# Patient Record
Sex: Male | Born: 1935 | Race: White | Hispanic: No | Smoking: Former smoker
Health system: Southern US, Community
[De-identification: ages and names within clinical notes are randomized; demographics above are authoritative.]

## PROBLEM LIST (undated history)

## (undated) DIAGNOSIS — K635 Polyp of colon: Secondary | ICD-10-CM

## (undated) DIAGNOSIS — M1990 Unspecified osteoarthritis, unspecified site: Secondary | ICD-10-CM

## (undated) DIAGNOSIS — E7529 Other sphingolipidosis: Secondary | ICD-10-CM

## (undated) DIAGNOSIS — G20C Parkinsonism, unspecified: Secondary | ICD-10-CM

## (undated) DIAGNOSIS — G459 Transient cerebral ischemic attack, unspecified: Secondary | ICD-10-CM

## (undated) DIAGNOSIS — G4730 Sleep apnea, unspecified: Secondary | ICD-10-CM

## (undated) DIAGNOSIS — I639 Cerebral infarction, unspecified: Secondary | ICD-10-CM

## (undated) DIAGNOSIS — G4700 Insomnia, unspecified: Secondary | ICD-10-CM

## (undated) DIAGNOSIS — J45909 Unspecified asthma, uncomplicated: Secondary | ICD-10-CM

## (undated) DIAGNOSIS — I25810 Atherosclerosis of coronary artery bypass graft(s) without angina pectoris: Secondary | ICD-10-CM

## (undated) DIAGNOSIS — F419 Anxiety disorder, unspecified: Secondary | ICD-10-CM

## (undated) DIAGNOSIS — Z5189 Encounter for other specified aftercare: Secondary | ICD-10-CM

## (undated) DIAGNOSIS — Z87442 Personal history of urinary calculi: Secondary | ICD-10-CM

## (undated) DIAGNOSIS — K5790 Diverticulosis of intestine, part unspecified, without perforation or abscess without bleeding: Secondary | ICD-10-CM

## (undated) DIAGNOSIS — F32A Depression, unspecified: Secondary | ICD-10-CM

## (undated) DIAGNOSIS — E785 Hyperlipidemia, unspecified: Secondary | ICD-10-CM

## (undated) DIAGNOSIS — E119 Type 2 diabetes mellitus without complications: Secondary | ICD-10-CM

## (undated) DIAGNOSIS — J4489 Other specified chronic obstructive pulmonary disease: Secondary | ICD-10-CM

## (undated) DIAGNOSIS — IMO0002 Reserved for concepts with insufficient information to code with codable children: Secondary | ICD-10-CM

## (undated) DIAGNOSIS — G20 Parkinson's disease: Secondary | ICD-10-CM

## (undated) DIAGNOSIS — R413 Other amnesia: Secondary | ICD-10-CM

## (undated) DIAGNOSIS — K219 Gastro-esophageal reflux disease without esophagitis: Secondary | ICD-10-CM

## (undated) DIAGNOSIS — K269 Duodenal ulcer, unspecified as acute or chronic, without hemorrhage or perforation: Secondary | ICD-10-CM

## (undated) DIAGNOSIS — J449 Chronic obstructive pulmonary disease, unspecified: Secondary | ICD-10-CM

## (undated) DIAGNOSIS — N182 Chronic kidney disease, stage 2 (mild): Secondary | ICD-10-CM

## (undated) DIAGNOSIS — G3180 Leukodystrophy, unspecified: Secondary | ICD-10-CM

## (undated) DIAGNOSIS — I471 Supraventricular tachycardia: Secondary | ICD-10-CM

## (undated) DIAGNOSIS — I2510 Atherosclerotic heart disease of native coronary artery without angina pectoris: Secondary | ICD-10-CM

## (undated) DIAGNOSIS — IMO0001 Reserved for inherently not codable concepts without codable children: Secondary | ICD-10-CM

## (undated) DIAGNOSIS — N200 Calculus of kidney: Secondary | ICD-10-CM

## (undated) DIAGNOSIS — H269 Unspecified cataract: Secondary | ICD-10-CM

## (undated) DIAGNOSIS — F329 Major depressive disorder, single episode, unspecified: Secondary | ICD-10-CM

## (undated) DIAGNOSIS — Z8679 Personal history of other diseases of the circulatory system: Secondary | ICD-10-CM

## (undated) HISTORY — DX: Chronic obstructive pulmonary disease, unspecified: J44.9

## (undated) HISTORY — DX: Other specified chronic obstructive pulmonary disease: J44.89

## (undated) HISTORY — DX: Supraventricular tachycardia: I47.1

## (undated) HISTORY — DX: Unspecified asthma, uncomplicated: J45.909

## (undated) HISTORY — PX: CYSTOSCOPY WITH URETEROSCOPY, STONE BASKETRY AND STENT PLACEMENT: SHX6378

## (undated) HISTORY — DX: Anxiety disorder, unspecified: F41.9

## (undated) HISTORY — PX: CARDIAC CATHETERIZATION: SHX172

## (undated) HISTORY — DX: Reserved for concepts with insufficient information to code with codable children: IMO0002

## (undated) HISTORY — DX: Unspecified osteoarthritis, unspecified site: M19.90

## (undated) HISTORY — DX: Gastro-esophageal reflux disease without esophagitis: K21.9

## (undated) HISTORY — DX: Leukodystrophy, unspecified: G31.80

## (undated) HISTORY — PX: UPPER GASTROINTESTINAL ENDOSCOPY: SHX188

## (undated) HISTORY — PX: POLYPECTOMY: SHX149

## (undated) HISTORY — DX: Personal history of other diseases of the circulatory system: Z86.79

## (undated) HISTORY — DX: Chronic kidney disease, stage 2 (mild): N18.2

## (undated) HISTORY — DX: Reserved for inherently not codable concepts without codable children: IMO0001

## (undated) HISTORY — PX: IRRIGATION AND DEBRIDEMENT SEBACEOUS CYST: SHX5255

## (undated) HISTORY — DX: Cerebral infarction, unspecified: I63.9

## (undated) HISTORY — PX: COLONOSCOPY: SHX174

## (undated) HISTORY — DX: Parkinson's disease: G20

## (undated) HISTORY — DX: Other amnesia: R41.3

## (undated) HISTORY — DX: Other sphingolipidosis: E75.29

## (undated) HISTORY — DX: Transient cerebral ischemic attack, unspecified: G45.9

## (undated) HISTORY — DX: Hyperlipidemia, unspecified: E78.5

## (undated) HISTORY — PX: OTHER SURGICAL HISTORY: SHX169

## (undated) HISTORY — PX: PROSTATE SURGERY: SHX751

## (undated) HISTORY — DX: Duodenal ulcer, unspecified as acute or chronic, without hemorrhage or perforation: K26.9

## (undated) HISTORY — DX: Parkinsonism, unspecified: G20.C

## (undated) HISTORY — DX: Unspecified cataract: H26.9

## (undated) HISTORY — PX: ANKLE FRACTURE SURGERY: SHX122

## (undated) HISTORY — DX: Insomnia, unspecified: G47.00

## (undated) HISTORY — DX: Diverticulosis of intestine, part unspecified, without perforation or abscess without bleeding: K57.90

## (undated) HISTORY — PX: GASTRECTOMY: SHX58

## (undated) HISTORY — PX: EXCISIONAL HEMORRHOIDECTOMY: SHX1541

## (undated) HISTORY — DX: Atherosclerotic heart disease of native coronary artery without angina pectoris: I25.10

## (undated) HISTORY — PX: VAGOTOMY: SUR1431

## (undated) HISTORY — DX: Personal history of urinary calculi: Z87.442

## (undated) HISTORY — DX: Type 2 diabetes mellitus without complications: E11.9

## (undated) HISTORY — PX: HIATAL HERNIA REPAIR: SHX195

## (undated) HISTORY — DX: Encounter for other specified aftercare: Z51.89

## (undated) HISTORY — DX: Atherosclerosis of coronary artery bypass graft(s) without angina pectoris: I25.810

## (undated) HISTORY — DX: Polyp of colon: K63.5

## (undated) HISTORY — DX: Depression, unspecified: F32.A

---
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
## 1994-10-25 HISTORY — PX: CORONARY ARTERY BYPASS GRAFT: SHX141

## 1998-01-02 ENCOUNTER — Ambulatory Visit (HOSPITAL_COMMUNITY): Admission: RE | Admit: 1998-01-02 | Discharge: 1998-01-02 | Payer: Self-pay | Admitting: Internal Medicine

## 1998-12-08 ENCOUNTER — Encounter: Payer: Self-pay | Admitting: Internal Medicine

## 1998-12-08 ENCOUNTER — Ambulatory Visit (HOSPITAL_COMMUNITY): Admission: RE | Admit: 1998-12-08 | Discharge: 1998-12-08 | Payer: Self-pay | Admitting: Internal Medicine

## 1999-12-16 ENCOUNTER — Ambulatory Visit (HOSPITAL_BASED_OUTPATIENT_CLINIC_OR_DEPARTMENT_OTHER): Admission: RE | Admit: 1999-12-16 | Discharge: 1999-12-16 | Payer: Self-pay | Admitting: Surgery

## 2000-03-13 ENCOUNTER — Encounter: Payer: Self-pay | Admitting: Emergency Medicine

## 2000-03-14 ENCOUNTER — Encounter: Payer: Self-pay | Admitting: Cardiology

## 2000-03-14 ENCOUNTER — Inpatient Hospital Stay (HOSPITAL_COMMUNITY): Admission: EM | Admit: 2000-03-14 | Discharge: 2000-03-16 | Payer: Self-pay | Admitting: Emergency Medicine

## 2000-03-15 ENCOUNTER — Encounter: Payer: Self-pay | Admitting: Cardiology

## 2000-03-16 ENCOUNTER — Encounter: Payer: Self-pay | Admitting: Cardiology

## 2000-07-29 ENCOUNTER — Encounter: Payer: Self-pay | Admitting: Urology

## 2000-07-29 ENCOUNTER — Encounter: Admission: RE | Admit: 2000-07-29 | Discharge: 2000-07-29 | Payer: Self-pay | Admitting: Urology

## 2000-12-21 ENCOUNTER — Encounter (INDEPENDENT_AMBULATORY_CARE_PROVIDER_SITE_OTHER): Payer: Self-pay | Admitting: *Deleted

## 2000-12-21 ENCOUNTER — Ambulatory Visit (HOSPITAL_BASED_OUTPATIENT_CLINIC_OR_DEPARTMENT_OTHER): Admission: RE | Admit: 2000-12-21 | Discharge: 2000-12-21 | Payer: Self-pay | Admitting: Surgery

## 2001-01-04 ENCOUNTER — Inpatient Hospital Stay (HOSPITAL_COMMUNITY): Admission: EM | Admit: 2001-01-04 | Discharge: 2001-01-06 | Payer: Self-pay | Admitting: Emergency Medicine

## 2001-01-04 ENCOUNTER — Encounter: Payer: Self-pay | Admitting: Emergency Medicine

## 2001-01-17 ENCOUNTER — Encounter: Admission: RE | Admit: 2001-01-17 | Discharge: 2001-01-17 | Payer: Self-pay | Admitting: Urology

## 2001-01-17 ENCOUNTER — Encounter: Payer: Self-pay | Admitting: Urology

## 2001-02-27 ENCOUNTER — Encounter: Admission: RE | Admit: 2001-02-27 | Discharge: 2001-02-27 | Payer: Self-pay | Admitting: Urology

## 2001-02-27 ENCOUNTER — Encounter: Payer: Self-pay | Admitting: Urology

## 2001-04-21 ENCOUNTER — Encounter: Admission: RE | Admit: 2001-04-21 | Discharge: 2001-04-21 | Payer: Self-pay | Admitting: Urology

## 2001-04-21 ENCOUNTER — Encounter: Payer: Self-pay | Admitting: Urology

## 2001-06-16 ENCOUNTER — Encounter: Payer: Self-pay | Admitting: Urology

## 2001-06-16 ENCOUNTER — Encounter: Admission: RE | Admit: 2001-06-16 | Discharge: 2001-06-16 | Payer: Self-pay | Admitting: Urology

## 2001-06-24 ENCOUNTER — Emergency Department (HOSPITAL_COMMUNITY): Admission: EM | Admit: 2001-06-24 | Discharge: 2001-06-24 | Payer: Self-pay | Admitting: *Deleted

## 2002-01-15 ENCOUNTER — Ambulatory Visit (HOSPITAL_COMMUNITY): Admission: RE | Admit: 2002-01-15 | Discharge: 2002-01-15 | Payer: Self-pay | Admitting: Internal Medicine

## 2002-01-15 ENCOUNTER — Encounter: Payer: Self-pay | Admitting: Internal Medicine

## 2002-02-20 ENCOUNTER — Encounter: Admission: RE | Admit: 2002-02-20 | Discharge: 2002-02-20 | Payer: Self-pay | Admitting: Urology

## 2002-02-20 ENCOUNTER — Encounter: Payer: Self-pay | Admitting: Urology

## 2002-10-29 ENCOUNTER — Encounter: Payer: Self-pay | Admitting: Urology

## 2002-10-29 ENCOUNTER — Encounter: Admission: RE | Admit: 2002-10-29 | Discharge: 2002-10-29 | Payer: Self-pay | Admitting: Urology

## 2002-11-08 ENCOUNTER — Encounter: Admission: RE | Admit: 2002-11-08 | Discharge: 2002-11-08 | Payer: Self-pay | Admitting: Urology

## 2002-11-08 ENCOUNTER — Encounter: Payer: Self-pay | Admitting: Urology

## 2002-11-15 ENCOUNTER — Encounter: Payer: Self-pay | Admitting: Emergency Medicine

## 2002-11-15 ENCOUNTER — Inpatient Hospital Stay (HOSPITAL_COMMUNITY): Admission: EM | Admit: 2002-11-15 | Discharge: 2002-11-16 | Payer: Self-pay | Admitting: Emergency Medicine

## 2002-11-16 ENCOUNTER — Encounter: Payer: Self-pay | Admitting: Cardiology

## 2003-07-29 ENCOUNTER — Ambulatory Visit (HOSPITAL_COMMUNITY): Admission: RE | Admit: 2003-07-29 | Discharge: 2003-07-29 | Payer: Self-pay | Admitting: Urology

## 2004-02-13 ENCOUNTER — Ambulatory Visit: Admission: RE | Admit: 2004-02-13 | Discharge: 2004-02-13 | Payer: Self-pay | Admitting: Internal Medicine

## 2004-11-03 ENCOUNTER — Ambulatory Visit: Payer: Self-pay | Admitting: Cardiology

## 2004-11-03 ENCOUNTER — Inpatient Hospital Stay (HOSPITAL_COMMUNITY): Admission: EM | Admit: 2004-11-03 | Discharge: 2004-11-06 | Payer: Self-pay | Admitting: *Deleted

## 2004-11-19 ENCOUNTER — Ambulatory Visit (HOSPITAL_COMMUNITY): Admission: RE | Admit: 2004-11-19 | Discharge: 2004-11-19 | Payer: Self-pay | Admitting: Neurosurgery

## 2005-01-29 ENCOUNTER — Inpatient Hospital Stay (HOSPITAL_COMMUNITY): Admission: EM | Admit: 2005-01-29 | Discharge: 2005-02-01 | Payer: Self-pay | Admitting: Emergency Medicine

## 2005-02-01 ENCOUNTER — Ambulatory Visit: Payer: Self-pay | Admitting: Cardiology

## 2005-02-09 ENCOUNTER — Ambulatory Visit: Payer: Self-pay | Admitting: Cardiology

## 2005-02-09 ENCOUNTER — Observation Stay (HOSPITAL_COMMUNITY): Admission: EM | Admit: 2005-02-09 | Discharge: 2005-02-11 | Payer: Self-pay | Admitting: Emergency Medicine

## 2005-02-18 ENCOUNTER — Ambulatory Visit: Payer: Self-pay | Admitting: Cardiology

## 2005-03-04 ENCOUNTER — Ambulatory Visit: Payer: Self-pay | Admitting: Internal Medicine

## 2005-03-15 ENCOUNTER — Ambulatory Visit: Payer: Self-pay | Admitting: *Deleted

## 2005-03-15 ENCOUNTER — Observation Stay (HOSPITAL_COMMUNITY): Admission: EM | Admit: 2005-03-15 | Discharge: 2005-03-20 | Payer: Self-pay | Admitting: Emergency Medicine

## 2005-04-23 ENCOUNTER — Ambulatory Visit (HOSPITAL_COMMUNITY): Admission: RE | Admit: 2005-04-23 | Discharge: 2005-04-23 | Payer: Self-pay | Admitting: Urology

## 2005-04-26 ENCOUNTER — Emergency Department (HOSPITAL_COMMUNITY): Admission: EM | Admit: 2005-04-26 | Discharge: 2005-04-26 | Payer: Self-pay | Admitting: Emergency Medicine

## 2005-04-29 ENCOUNTER — Inpatient Hospital Stay (HOSPITAL_COMMUNITY): Admission: EM | Admit: 2005-04-29 | Discharge: 2005-05-01 | Payer: Self-pay | Admitting: Urology

## 2005-04-30 ENCOUNTER — Ambulatory Visit: Payer: Self-pay | Admitting: Cardiology

## 2005-05-18 ENCOUNTER — Inpatient Hospital Stay (HOSPITAL_COMMUNITY): Admission: RE | Admit: 2005-05-18 | Discharge: 2005-05-28 | Payer: Self-pay | Admitting: Urology

## 2005-05-24 ENCOUNTER — Encounter (INDEPENDENT_AMBULATORY_CARE_PROVIDER_SITE_OTHER): Payer: Self-pay | Admitting: *Deleted

## 2005-05-28 ENCOUNTER — Inpatient Hospital Stay (HOSPITAL_COMMUNITY): Admission: EM | Admit: 2005-05-28 | Discharge: 2005-05-31 | Payer: Self-pay | Admitting: Emergency Medicine

## 2005-06-04 ENCOUNTER — Ambulatory Visit: Payer: Self-pay | Admitting: Cardiology

## 2005-07-28 ENCOUNTER — Ambulatory Visit: Payer: Self-pay | Admitting: Cardiology

## 2005-09-14 ENCOUNTER — Ambulatory Visit: Payer: Self-pay | Admitting: Cardiology

## 2005-09-23 ENCOUNTER — Ambulatory Visit: Payer: Self-pay | Admitting: Cardiology

## 2005-10-27 ENCOUNTER — Ambulatory Visit: Payer: Self-pay | Admitting: Gastroenterology

## 2005-11-05 ENCOUNTER — Ambulatory Visit: Payer: Self-pay | Admitting: Internal Medicine

## 2005-11-05 ENCOUNTER — Ambulatory Visit: Payer: Self-pay | Admitting: Gastroenterology

## 2005-11-05 ENCOUNTER — Encounter (INDEPENDENT_AMBULATORY_CARE_PROVIDER_SITE_OTHER): Payer: Self-pay | Admitting: *Deleted

## 2005-11-18 ENCOUNTER — Ambulatory Visit: Payer: Self-pay | Admitting: Cardiology

## 2006-01-18 ENCOUNTER — Ambulatory Visit: Payer: Self-pay | Admitting: Cardiology

## 2006-05-17 ENCOUNTER — Ambulatory Visit: Payer: Self-pay | Admitting: Cardiology

## 2006-10-31 ENCOUNTER — Ambulatory Visit: Payer: Self-pay | Admitting: Cardiology

## 2007-06-05 ENCOUNTER — Ambulatory Visit: Payer: Self-pay | Admitting: Cardiology

## 2007-07-06 ENCOUNTER — Ambulatory Visit (HOSPITAL_COMMUNITY): Admission: RE | Admit: 2007-07-06 | Discharge: 2007-07-06 | Payer: Self-pay | Admitting: Internal Medicine

## 2007-07-13 ENCOUNTER — Encounter: Admission: RE | Admit: 2007-07-13 | Discharge: 2007-07-13 | Payer: Self-pay | Admitting: Internal Medicine

## 2007-07-18 ENCOUNTER — Encounter: Admission: RE | Admit: 2007-07-18 | Discharge: 2007-07-18 | Payer: Self-pay | Admitting: Internal Medicine

## 2007-08-16 ENCOUNTER — Encounter: Admission: RE | Admit: 2007-08-16 | Discharge: 2007-08-16 | Payer: Self-pay | Admitting: Internal Medicine

## 2007-09-05 ENCOUNTER — Encounter: Admission: RE | Admit: 2007-09-05 | Discharge: 2007-09-05 | Payer: Self-pay | Admitting: Internal Medicine

## 2007-09-25 ENCOUNTER — Encounter: Admission: RE | Admit: 2007-09-25 | Discharge: 2007-09-25 | Payer: Self-pay | Admitting: Internal Medicine

## 2007-12-11 ENCOUNTER — Ambulatory Visit: Payer: Self-pay | Admitting: Cardiology

## 2007-12-30 ENCOUNTER — Ambulatory Visit: Payer: Self-pay | Admitting: Cardiology

## 2007-12-30 ENCOUNTER — Inpatient Hospital Stay (HOSPITAL_COMMUNITY): Admission: EM | Admit: 2007-12-30 | Discharge: 2008-01-02 | Payer: Self-pay | Admitting: Emergency Medicine

## 2008-02-05 ENCOUNTER — Ambulatory Visit: Payer: Self-pay | Admitting: Cardiology

## 2008-02-05 LAB — CONVERTED CEMR LAB
BUN: 16 mg/dL (ref 6–23)
Potassium: 4.4 meq/L (ref 3.5–5.1)

## 2008-02-15 ENCOUNTER — Encounter: Admission: RE | Admit: 2008-02-15 | Discharge: 2008-02-15 | Payer: Self-pay | Admitting: Internal Medicine

## 2008-03-07 ENCOUNTER — Encounter: Admission: RE | Admit: 2008-03-07 | Discharge: 2008-03-07 | Payer: Self-pay | Admitting: Internal Medicine

## 2008-07-10 ENCOUNTER — Ambulatory Visit: Payer: Self-pay | Admitting: Cardiology

## 2008-07-21 ENCOUNTER — Ambulatory Visit: Payer: Self-pay | Admitting: Cardiology

## 2008-07-21 ENCOUNTER — Inpatient Hospital Stay (HOSPITAL_COMMUNITY): Admission: EM | Admit: 2008-07-21 | Discharge: 2008-07-24 | Payer: Self-pay | Admitting: Emergency Medicine

## 2008-07-22 ENCOUNTER — Encounter: Payer: Self-pay | Admitting: Cardiology

## 2008-08-01 ENCOUNTER — Ambulatory Visit: Payer: Self-pay | Admitting: Cardiology

## 2008-08-07 ENCOUNTER — Encounter: Admission: RE | Admit: 2008-08-07 | Discharge: 2008-08-07 | Payer: Self-pay | Admitting: Orthopedic Surgery

## 2008-09-09 ENCOUNTER — Ambulatory Visit: Payer: Self-pay | Admitting: Cardiology

## 2008-09-26 ENCOUNTER — Ambulatory Visit: Payer: Self-pay | Admitting: Cardiology

## 2008-11-26 ENCOUNTER — Ambulatory Visit: Payer: Self-pay | Admitting: Cardiology

## 2009-03-12 DIAGNOSIS — K210 Gastro-esophageal reflux disease with esophagitis: Secondary | ICD-10-CM

## 2009-03-12 DIAGNOSIS — Z8673 Personal history of transient ischemic attack (TIA), and cerebral infarction without residual deficits: Secondary | ICD-10-CM

## 2009-03-12 DIAGNOSIS — I471 Supraventricular tachycardia, unspecified: Secondary | ICD-10-CM

## 2009-03-12 DIAGNOSIS — E1129 Type 2 diabetes mellitus with other diabetic kidney complication: Secondary | ICD-10-CM

## 2009-03-12 DIAGNOSIS — I10 Essential (primary) hypertension: Secondary | ICD-10-CM

## 2009-03-12 DIAGNOSIS — J449 Chronic obstructive pulmonary disease, unspecified: Secondary | ICD-10-CM | POA: Insufficient documentation

## 2009-03-12 DIAGNOSIS — E119 Type 2 diabetes mellitus without complications: Secondary | ICD-10-CM | POA: Insufficient documentation

## 2009-03-12 DIAGNOSIS — Z87442 Personal history of urinary calculi: Secondary | ICD-10-CM

## 2009-03-12 DIAGNOSIS — E785 Hyperlipidemia, unspecified: Secondary | ICD-10-CM | POA: Insufficient documentation

## 2009-03-12 DIAGNOSIS — I25709 Atherosclerosis of coronary artery bypass graft(s), unspecified, with unspecified angina pectoris: Secondary | ICD-10-CM | POA: Insufficient documentation

## 2009-03-12 DIAGNOSIS — E782 Mixed hyperlipidemia: Secondary | ICD-10-CM

## 2009-03-12 DIAGNOSIS — I25810 Atherosclerosis of coronary artery bypass graft(s) without angina pectoris: Secondary | ICD-10-CM

## 2009-03-12 DIAGNOSIS — J45909 Unspecified asthma, uncomplicated: Secondary | ICD-10-CM

## 2009-03-12 HISTORY — DX: Supraventricular tachycardia, unspecified: I47.10

## 2009-03-12 HISTORY — DX: Personal history of urinary calculi: Z87.442

## 2009-03-12 HISTORY — DX: Supraventricular tachycardia: I47.1

## 2009-03-13 ENCOUNTER — Ambulatory Visit: Payer: Self-pay | Admitting: Cardiology

## 2009-04-21 ENCOUNTER — Ambulatory Visit (HOSPITAL_BASED_OUTPATIENT_CLINIC_OR_DEPARTMENT_OTHER): Admission: RE | Admit: 2009-04-21 | Discharge: 2009-04-21 | Payer: Self-pay | Admitting: Urology

## 2009-07-07 ENCOUNTER — Ambulatory Visit: Payer: Self-pay | Admitting: Cardiology

## 2009-07-11 ENCOUNTER — Encounter: Payer: Self-pay | Admitting: Cardiology

## 2009-08-27 ENCOUNTER — Telehealth (INDEPENDENT_AMBULATORY_CARE_PROVIDER_SITE_OTHER): Payer: Self-pay | Admitting: *Deleted

## 2009-08-27 ENCOUNTER — Observation Stay (HOSPITAL_COMMUNITY): Admission: RE | Admit: 2009-08-27 | Discharge: 2009-08-28 | Payer: Self-pay | Admitting: Orthopaedic Surgery

## 2010-01-27 ENCOUNTER — Encounter: Payer: Self-pay | Admitting: Cardiology

## 2010-02-25 ENCOUNTER — Encounter: Payer: Self-pay | Admitting: Cardiology

## 2010-10-09 ENCOUNTER — Ambulatory Visit: Payer: Self-pay | Admitting: Cardiology

## 2010-10-09 ENCOUNTER — Encounter: Payer: Self-pay | Admitting: Cardiology

## 2010-10-15 ENCOUNTER — Emergency Department (HOSPITAL_COMMUNITY)
Admission: EM | Admit: 2010-10-15 | Discharge: 2010-10-15 | Payer: Self-pay | Source: Home / Self Care | Admitting: Emergency Medicine

## 2010-11-15 ENCOUNTER — Encounter: Payer: Self-pay | Admitting: Internal Medicine

## 2010-11-16 ENCOUNTER — Encounter: Payer: Self-pay | Admitting: Internal Medicine

## 2010-11-26 NOTE — Assessment & Plan Note (Signed)
Summary: f93m   Visit Type:  6 months follow up  CC:  no complains.  History of Present Illness: Overall patient seems to be getting along well.  Not having any more kidney stones.  Feels good overall.  No passing out.    Current Medications (verified): 1)  Pravastatin Sodium 40 Mg Tabs (Pravastatin Sodium) .... Take One Tablet By Mouth Daily At Bedtime 2)  Metformin Hcl 500 Mg Tabs (Metformin Hcl) .... Take 1 Tablet By Mouth Two Times A Day 3)  Aspirin 81 Mg Tbec (Aspirin) .... Take One Tablet By Mouth Daily 4)  Alprazolam Xr 1 Mg Xr24h-Tab (Alprazolam) .... Take 1 Tablet By Mouth Once A Day At Bedtime 5)  Lomotil 2.5-0.025 Mg Tabs (Diphenoxylate-Atropine) .... Take 1 Tablet By Mouth Two Times A Day 6)  Multivitamins  Tabs (Multiple Vitamin) .... Take 1 Tablet By Mouth Once A Day 7)  Plavix 75 Mg Tabs (Clopidogrel Bisulfate) .... Take One Tablet By Mouth Daily 8)  Vitamin D 2.000 Iu Cap .... Take 1 Capsule By Mouth Once A Day 9)  Potassium Citrate 1080 Mg Cr-Tabs (Potassium Citrate) .... Take 1 Tablet By Mouth Two Times A Day 10)  Nitroglycerin 0.4 Mg Subl (Nitroglycerin) .... One Tablet Under Tongue Every 5 Minutes As Needed For Chest Pain---May Repeat Times Three 11)  Zantac 150 Mg Tabs (Ranitidine Hcl) .... Take 1 Tablet By Mouth Once A Day 12)  Advair Diskus 250-50 Mcg/dose Misc (Fluticasone-Salmeterol) .... One Puff Two Times A Day  Allergies: 1)  ! Beta Blockers  Vital Signs:  Patient profile:   76 year old male Height:      71 inches Weight:      171.50 pounds BMI:     24.01 Pulse rate:   62 / minute Pulse rhythm:   regular Resp:     18 per minute BP sitting:   122 / 70  (left arm) Cuff size:   large  Vitals Entered By: Vikki Ports (Feb 25, 2010 2:59 PM)  Physical Exam  General:  Well developed, well nourished, in no acute distress. Head:  normocephalic and atraumatic Eyes:  PERRLA/EOM intact; conjunctiva and lids normal. Lungs:  Clear bilaterally to  auscultation and percussion. Heart:  Non-displaced PMI, chest non-tender; regular rate and rhythm, S1, S2 without murmurs, rubs or gallops. Carotid upstroke normal, no bruit. Normal abdominal aortic size, no bruits. Pulses:  pulses normal in all 4 extremities Extremities:  No clubbing or cyanosis. Neurologic:  Alert and oriented x 3.   EKG  Procedure date:  02/25/2010  Findings:      NSR.  LVH.  Otherwise normal.   Impression & Recommendations:  Problem # 1:  CAD, ARTERY BYPASS GRAFT (ICD-414.04)  stable at present.  His updated medication list for this problem includes:    Aspirin 81 Mg Tbec (Aspirin) .[name]... Take one tablet by mouth daily    Plavix 75 Mg Tabs (Clopidogrel bisulfate) .[name]... Take one tablet by mouth daily    Nitroglycerin 0.4 Mg Subl (Nitroglycerin) ..... One tablet under tongue every 5 minutes as needed for chest pain---may repeat times three  Orders: EKG w/ Interpretation (93000)  Problem # 2:  SYNCOPE (ICD-780.2) No recurrence of syncope.  Mechanism reviewed with patient, and he understands that he should remain well hydrated.    Problem # 3:  TRANSIENT ISCHEMIC ATTACKS, HX OF (ICD-V12.50) reason for ASA and plavix.   Problem # 4:  HYPERLIPIDEMIA (ICD-272.4)  Dr. Sharrie Rothman checks regularly.  His updated  medication list for this problem includes:    Pravastatin Sodium 40 Mg Tabs (Pravastatin sodium) .[name]... Take one tablet by mouth daily at bedtime  Orders: EKG w/ Interpretation (93000)  Patient Instructions: 1)  Your physician recommends that you continue on your current medications as directed. Please refer to the Current Medication list given to you today. 2)  Your physician wants you to follow-up in:   6 MONTHS. You will receive a reminder letter in the mail two months in advance. If you don't receive a letter, please call our office to schedule the follow-up appointment.

## 2010-11-26 NOTE — Assessment & Plan Note (Signed)
Summary: f26m   Visit Type:  6 months follow up  CC:  No cardiac complaints.  History of Present Illness: Feels good.  No passing out.  Reminded him of need for good hydration.  Discussed at length.  He has kidney stones, and understands the need.   No chest pain.    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 Take One Tablet By Mouth Daily At Bedtime 2)  Metformin Hcl 500 Mg Tabs (Metformin Hcl) .... Take 1 Tablet By Mouth Two Times A Day 3)  Aspirin 81 Mg Tbec (Aspirin) .... Take One Tablet By Mouth Daily 4)  Alprazolam Xr 1 Mg Xr24h-Tab (Alprazolam) .... Take 1 Tablet By Mouth Once A Day At Bedtime 5)  B Complex-Vitamin C  Caps (B Complex-C) .... Take 1 Tablet By Mouth Once A Day 6)  Plavix 75 Mg Tabs (Clopidogrel Bisulfate) .... Take One Tablet By Mouth Daily 7)  Vitamin D3 2000 Unit Caps (Cholecalciferol) .... Take 1 Capsule By Mouth Once A Day 8)  Potassium Citrate 1080 Mg Cr-Tabs (Potassium Citrate) .... Take 1 Tablet By Mouth Two Times A Day 9)  Nitroglycerin 0.4 Mg Subl (Nitroglycerin) .... One Tablet Under Tongue Every 5 Minutes As Needed For Chest Pain---May Repeat Times Three 10)  Lyrica 75 Mg Caps (Pregabalin) .... Take 1 Capsule By Mouth Once A Day 11)  Anti-Diarrheal 2 Mg Tabs (Loperamide Hcl) .... Take 1 Tablet By Mouth Once A Day 12)  Melatonin 1 Mg Caps (Melatonin) .... Take 1 Capsule By Mouth Once A Day  Allergies: 1)  ! Beta  Blockers  Vital Signs:  Patient profile:   76 year old male Height:      71 inches Weight:      172 pounds BMI:     24.08 Pulse rate:   74 / minute Pulse rhythm:   regular Resp:     18 per minute BP sitting:   110 / 70  (left arm) Cuff size:   large  Vitals Entered By: Vikki Ports (October 09, 2010 10:01 AM)  Physical Exam  General:  Well developed, well nourished, in no acute distress. Head:  normocephalic and atraumatic Eyes:  PERRLA/EOM intact; conjunctiva and lids normal. Lungs:  Clear bilaterally to auscultation and percussion. Heart:  Non-displaced PMI, chest non-tender; regular rate and rhythm, S1, S2 without murmurs, rubs or gallops. Carotid upstroke normal, no bruit.  Abdomen:  Bowel sounds positive; abdomen soft and non-tender without masses, organomegaly, or hernias noted. No hepatosplenomegaly. Extremities:  No clubbing or cyanosis. Neurologic:  Alert and oriented x 3.   Impression & Recommendations:  Problem # 1:  CAD, ARTERY BYPASS GRAFT (ICD-414.04)  No angina.  Doing well.  Denies chest pain. His updated medication list for this problem includes:    Aspirin 81 Mg Tbec (Aspirin) .[name]... Take one tablet by mouth daily    Plavix 75 Mg Tabs (Clopidogrel bisulfate) .[name]... Take one tablet by mouth daily    Nitroglycerin  0.4 Mg Subl (Nitroglycerin) ..... One tablet under tongue every 5 minutes as needed for chest pain---may repeat times three  Orders: EKG w/ Interpretation (93000)  Problem # 2:  HYPERLIPIDEMIA (ICD-272.4) On simva 80mg .  Patient aprised of FDA warning and encourage him to discuss with his primary MD.  Atorvastatin is now generic, and an option.   Given amlodipine, and dose, suggest consider change.  His updated medication list for this problem includes:    Simvastatin 80 Mg Tabs (Simvastatin) .[name]... Take one tablet by mouth daily at bedtime  His updated medication list for this problem includes:    Simvastatin 80 Mg Tabs (Simvastatin) .[name]... Take  one tablet by mouth daily at bedtime  Problem # 3:  SYNCOPE (ICD-780.2) No recurrence.  Suggest maintain hydration.  Neurologically mediated.  His updated medication list for this problem includes:    Aspirin 81 Mg Tbec (Aspirin) .[name]... Take one tablet by mouth daily    Plavix 75 Mg Tabs (Clopidogrel bisulfate) .[name]... Take one tablet by mouth daily    Nitroglycerin 0.4 Mg Subl (Nitroglycerin) ..... One tablet under tongue every 5 minutes as needed for chest pain---may repeat times three  Problem # 4:  TIA (ICD-435.9) on DAPT.    Patient Instructions: 1)  Your physician recommends that you continue on your current medications as directed. Please refer to the Current Medication list given to you today. 2)  Your physician wants you to follow-up in: 6 MONTHS.  You will receive a reminder letter in the mail two months in advance. If you don't receive a letter, please call our office to schedule the follow-up appointment.

## 2011-01-20 ENCOUNTER — Telehealth: Payer: Self-pay | Admitting: Cardiology

## 2011-01-20 NOTE — Telephone Encounter (Signed)
Dr borden's nurse selita calling re surgical clearance request faxed over [phone], still hasn't heard anything and calling for status, pt needs sub cyst removed , needs to be off plavix for 5 days-pls call

## 2011-01-21 NOTE — Telephone Encounter (Signed)
Dr Riley Kill called and spoke with patient and Dr Oneta Rack started the pt on Plavix for TIA.  Dr Vevelyn Royals office needs to contact Dr Kathryne Sharper office in regards to holding plavix. Dr Riley Kill completed faxed paperwork and this information will be faxed to Dr Vevelyn Royals office.

## 2011-01-25 NOTE — Telephone Encounter (Signed)
Form faxed to Dr Vevelyn Royals office with information that they need to contact Dr Kathryne Sharper office in regards to Plavix.

## 2011-01-27 LAB — COMPREHENSIVE METABOLIC PANEL
ALT: 14 U/L (ref 0–53)
AST: 13 U/L (ref 0–37)
Alkaline Phosphatase: 58 U/L (ref 39–117)
BUN: 14 mg/dL (ref 6–23)
CO2: 26 mEq/L (ref 19–32)
Calcium: 9.4 mg/dL (ref 8.4–10.5)
Creatinine, Ser: 1.21 mg/dL (ref 0.4–1.5)
GFR calc Af Amer: >60 mL/min (ref >60)
Sodium: 137 mEq/L (ref 135–145)
Total Bilirubin: 0.4 mg/dL (ref 0.3–1.2)
Total Protein: 6.7 g/dL (ref 6.0–8.3)

## 2011-01-27 LAB — CBC
HCT: 38.1 % — ABNORMAL LOW (ref 39.0–52.0)
MCHC: 34.8 g/dL (ref 30.0–36.0)
MCV: 94 fL (ref 78.0–100.0)
Platelets: 200 10*3/uL (ref 150–400)
RBC: 4.05 MIL/uL — ABNORMAL LOW (ref 4.22–5.81)

## 2011-01-27 LAB — DIFFERENTIAL
Basophils Absolute: 0 10*3/uL (ref 0.0–0.1)
Eosinophils Absolute: 0.1 10*3/uL (ref 0.0–0.7)
Monocytes Absolute: 0.3 10*3/uL (ref 0.1–1.0)
Neutro Abs: 2.9 10*3/uL (ref 1.7–7.7)

## 2011-01-27 LAB — GLUCOSE, CAPILLARY: Glucose-Capillary: 90 mg/dL (ref 70–99)

## 2011-02-01 LAB — POCT I-STAT 4, (NA,K, GLUC, HGB,HCT)
Glucose, Bld: 95 mg/dL (ref 70–99)
Sodium: 143 mEq/L (ref 135–145)

## 2011-02-19 ENCOUNTER — Other Ambulatory Visit: Payer: Self-pay | Admitting: Anesthesiology

## 2011-02-19 ENCOUNTER — Encounter (HOSPITAL_COMMUNITY): Payer: Medicare Other

## 2011-02-19 ENCOUNTER — Other Ambulatory Visit (HOSPITAL_COMMUNITY): Payer: Self-pay | Admitting: Urology

## 2011-02-19 ENCOUNTER — Ambulatory Visit (HOSPITAL_COMMUNITY)
Admission: RE | Admit: 2011-02-19 | Discharge: 2011-02-19 | Disposition: A | Discharge status: Home / Self Care | Payer: Medicare Other | Source: Ambulatory Visit | Attending: Urology | Admitting: Urology

## 2011-02-19 ENCOUNTER — Other Ambulatory Visit: Payer: Self-pay | Admitting: Urology

## 2011-02-19 DIAGNOSIS — K449 Diaphragmatic hernia without obstruction or gangrene: Secondary | ICD-10-CM | POA: Insufficient documentation

## 2011-02-19 DIAGNOSIS — Z951 Presence of aortocoronary bypass graft: Secondary | ICD-10-CM | POA: Insufficient documentation

## 2011-02-19 DIAGNOSIS — Z01818 Encounter for other preprocedural examination: Secondary | ICD-10-CM

## 2011-02-19 DIAGNOSIS — Z01812 Encounter for preprocedural laboratory examination: Secondary | ICD-10-CM | POA: Insufficient documentation

## 2011-02-19 DIAGNOSIS — Z87891 Personal history of nicotine dependence: Secondary | ICD-10-CM | POA: Insufficient documentation

## 2011-02-19 DIAGNOSIS — L723 Sebaceous cyst: Secondary | ICD-10-CM | POA: Insufficient documentation

## 2011-02-19 DIAGNOSIS — M5380 Other specified dorsopathies, site unspecified: Secondary | ICD-10-CM | POA: Insufficient documentation

## 2011-02-19 DIAGNOSIS — M899 Disorder of bone, unspecified: Secondary | ICD-10-CM | POA: Insufficient documentation

## 2011-02-19 DIAGNOSIS — I77810 Thoracic aortic ectasia: Secondary | ICD-10-CM | POA: Insufficient documentation

## 2011-02-19 DIAGNOSIS — Z981 Arthrodesis status: Secondary | ICD-10-CM | POA: Insufficient documentation

## 2011-02-19 LAB — CBC
HCT: 42 % (ref 39.0–52.0)
MCH: 31.4 pg (ref 26.0–34.0)
MCV: 94.8 fL (ref 78.0–100.0)
RBC: 4.43 MIL/uL (ref 4.22–5.81)
WBC: 5 10*3/uL (ref 4.0–10.5)

## 2011-02-19 LAB — BASIC METABOLIC PANEL
Chloride: 109 mEq/L (ref 96–112)
GFR calc Af Amer: >60 mL/min (ref >60)
Potassium: 4.7 mEq/L (ref 3.5–5.1)

## 2011-02-19 LAB — APTT: aPTT: 28 seconds (ref 24–37)

## 2011-02-19 LAB — SURGICAL PCR SCREEN
MRSA, PCR: NEGATIVE
Staphylococcus aureus: NEGATIVE

## 2011-02-26 ENCOUNTER — Ambulatory Visit (HOSPITAL_COMMUNITY)
Admission: RE | Admit: 2011-02-26 | Discharge: 2011-02-26 | Disposition: A | Discharge status: Home / Self Care | Payer: Medicare Other | Source: Ambulatory Visit | Attending: Urology | Admitting: Urology

## 2011-02-26 ENCOUNTER — Other Ambulatory Visit: Payer: Self-pay | Admitting: Urology

## 2011-02-26 DIAGNOSIS — Z7902 Long term (current) use of antithrombotics/antiplatelets: Secondary | ICD-10-CM | POA: Insufficient documentation

## 2011-02-26 DIAGNOSIS — Z7982 Long term (current) use of aspirin: Secondary | ICD-10-CM | POA: Insufficient documentation

## 2011-02-26 DIAGNOSIS — Z79899 Other long term (current) drug therapy: Secondary | ICD-10-CM | POA: Insufficient documentation

## 2011-02-26 DIAGNOSIS — Z951 Presence of aortocoronary bypass graft: Secondary | ICD-10-CM | POA: Insufficient documentation

## 2011-02-26 DIAGNOSIS — I2510 Atherosclerotic heart disease of native coronary artery without angina pectoris: Secondary | ICD-10-CM | POA: Insufficient documentation

## 2011-02-26 DIAGNOSIS — E119 Type 2 diabetes mellitus without complications: Secondary | ICD-10-CM | POA: Insufficient documentation

## 2011-02-26 DIAGNOSIS — I4891 Unspecified atrial fibrillation: Secondary | ICD-10-CM | POA: Insufficient documentation

## 2011-02-26 DIAGNOSIS — L723 Sebaceous cyst: Secondary | ICD-10-CM | POA: Insufficient documentation

## 2011-02-26 DIAGNOSIS — K219 Gastro-esophageal reflux disease without esophagitis: Secondary | ICD-10-CM | POA: Insufficient documentation

## 2011-02-26 LAB — GLUCOSE, CAPILLARY: Glucose-Capillary: 99 mg/dL (ref 70–99)

## 2011-02-27 NOTE — Op Note (Signed)
  NAME:  John [name], John [name] NO.:  [id number]  MEDICAL RECORD NO.:  [id number]           PATIENT TYPE:  O  LOCATION:  DAYL                         FACILITY:  [facility]  PHYSICIAN:  Heloise Purpura, MD      DATE OF BIRTH:  29-Jun-1935  DATE OF PROCEDURE:  02/26/2011 DATE OF DISCHARGE:                              OPERATIVE REPORT   PREOPERATIVE DIAGNOSIS:  Right scrotal sebaceous cyst.  POSTOPERATIVE DIAGNOSIS:  Right scrotal sebaceous cyst.  PROCEDURE:  Excision of right scrotal sebaceous cyst.  SURGEON:  Heloise Purpura, MD  ANESTHESIA:  General.  COMPLICATIONS:  None.  INDICATION:  [name] is a 76 year old gentleman with a symptomatic right scrotal sebaceous cyst.  After reviewing options for management, he did wish to proceed with surgical excision.  The potential risks and benefits were discussed with the patient as well as the potential complications and he elected to proceed.  Informed consent was obtained.  DESCRIPTION OF PROCEDURE:  The patient was taken to the operating room and a general anesthetic was administered.  He was given preoperative antibiotics, placed in the supine position and his genitalia was prepped and draped in the usual sterile fashion.  Next, preoperative time-out was performed.  The aforementioned sebaceous cyst on the posterior aspect of the right scrotum was identified.  This was then excised in an elliptical fashion with a knife used to incise the skin.  A combination of sharp-end cautery dissection was then used to excise the remaining portion of this lesion.  It was noted to be superficial and encapsulated.  There was no evidence of any concerning findings that would be worrisome for possible malignancy.  Hemostasis was achieved with electrocautery and the skin edges were then reapproximated with interrupted 4-0 chromic sutures.  A Telfa dressing was applied and the patient tolerated the procedure well without complications.  He  was able to be awakened and transferred to the recovery unit in satisfactory condition.     Heloise Purpura, MD     LB/MEDQ  D:  02/26/2011  T:  02/26/2011  Job:  [id number]  Electronically Signed by Heloise Purpura MD on 02/27/2011 04:20:01 PM

## 2011-03-09 NOTE — Letter (Signed)
July 10, 2008    [name], MD  [address]  [place], [place] [address]   RE:  John [name], John [name]  MRN:  [id number]  /  DOB:  1935-02-06   Dear Annette Stable,   I had the pleasure of seeing [name] in the office today in  followup.  Since I last saw him, he has had absolutely no further  episodes of syncope or presyncope.  He has also incidentally enough  seemingly has not had any further kidney stones.  He says he is drinking  a lot more of fluids than he has in the past, and I have reinforced this  over and over and over again.   His medications include pravastatin 40 mg daily, metformin 500 mg daily,  Diovan 160 mg 1-1/2 tablet daily, aspirin 81 mg daily, alprazolam 1 mg  nightly, Lomotil 2.5 two daily, multivitamin daily, Plavix 75 mg daily,  vitamin D daily, and some potassium.   On physical today, the blood pressure by me was 95/60, the pulse was 70.  The lung fields were clear and the cardiac rhythm was regular.   His electrocardiogram demonstrates normal sinus rhythm with minimal  voltage to her left ventricular hypertrophy.   To summarize, this very nice gentleman remains stable from a cardiac  standpoint, I would strongly encourage him to remain hydrated.  He does  have issues that could lead to dehydration.  It remains quite important  for him to continue his fluids as he is stable from a  cardiac standpoint.  We will not see him back in followup for another  year unless a problem would arise, but I would stand more than ready to  see him at any time.   Thanks for allowing me to share in his care.    Sincerely,      [name]. Riley Kill, MD, [facility]  Electronically Signed    TDS/MedQ  DD: 07/10/2008  DT: 07/10/2008  Job #: [id number]

## 2011-03-09 NOTE — H&P (Signed)
NAME:  John [name], POLLACK NO.:  [id number]   MEDICAL RECORD NO.:  [id number]          PATIENT TYPE:  EMS   LOCATION:  MAJO                         FACILITY:  [facility]   PHYSICIAN:  Adela Ports, MD   DATE OF BIRTH:  December 17, 1934   DATE OF ADMISSION:  07/21/2008  DATE OF DISCHARGE:                              HISTORY & PHYSICAL   PRIMARY CARDIOLOGIST:  [name]. Riley Kill, MD, [facility].   PRIMARY CARE Kalifa Cadden:  [name], M.D.   CHIEF COMPLAINT:  Chest pain and hypertension.   HISTORY OF PRESENT ILLNESS:  Mr.  Baetz is a 76 year old man with a  history of coronary artery disease status post CABG in 1996, history of  neurogenic syncope, and history of SVT in the past.  [name] had an  admission for hypertension and associated chest pressure in March of  2009.  Since that time he reports that he has been feeling well and has  remained active with no specific complaints.  On the day prior to  admission, he reported that he was watching a ball game and he had about  15 seconds of what felt like skipped heartbeats, but these resolved and  otherwise he was feeling well.  On the morning of admission, September  27, he started feeling somewhat unwell with a vague feeling of malaise,  lightheadedness and some mild nausea.  He attempted to go to church, but  on the way there felt even worse with worsening lightheadedness, nausea  and some chest pressure, so he returned home.  He called his primary  care doctor who happened to be in the office and went in to be seen.  At  that time he had worsening of his chest pressure with worsening nausea,  shortness of breath and lightheadedness.  He was found to have a blood  pressure of 172/112 and a heart rate of 138 that appeared to be a sinus  rhythm.  Emergency medical services were called and he received  nitroglycerin, aspirin, oxygen and some fluid on the way to the [facility] Emergency Department.  Those interventions  had partial effect with  some relief of his symptoms.  In the emergency department he received  nitroglycerin continuous drip through IV and had resolution of his  hypertension with improvement of his symptoms.  He denies any recent  history of syncope, palpitations other than described above, need to  take nitroglycerin, orthopnea, lower extremity edema, PND, dyspnea on  exertion or other worrisome symptoms.   PAST MEDICAL HISTORY:  1. Coronary artery disease status post CABG in 1996 with LIMA to the      LAD and saphenous vein graft to the first diagonal branch.  He has      had subsequent catheterizations in 2002 and within the past year      that reportedly showed patent grafts and no significant change.  2. Hypertension.  3. Dyslipidemia.  4. Diabetes.  5. Supraventricular tachycardia in the past, question atrial flutter.      He reportedly was sent to [facility] for potential  ablation, but they were unable to induce his arrhythmia, and he      therefore had no ablation.  6. COPD.  7. Adrenal leukodystrophy.  8. Gastroesophageal reflux disease/hiatal hernia.  9. History of renal stones.  10.Peptic ulcer disease status post gastrectomy and vagotomy.  11.Subdural hematoma after trauma.  12.TIA.  13.History of syncope thought to be neurally mediated with treatment      with Florinef in the past.  14.History of TURP.   SOCIAL HISTORY:  The patient lives in [place] with his wife and  has two adult children in the area.  He quit smoking in 1965 and does  not drink alcohol or use drugs.   FAMILY HISTORY:  Notable for father who died at the age of 45 of  cerebral hemorrhage and a brother with coronary artery disease and  diabetes.   REVIEW OF SYSTEMS:  Notable for the above cardiovascular and pulmonary  symptoms.  He has not had any syncope in the near past, but as mentioned  above was on Florinef at some time.  More recently, his blood pressure  has been on  the low side with systolics below 100 at his primary care  office.  He does report bilateral lower extremity paresthesias from the  knees down for the past 6 months to a year.  He continues to have some  nausea.  He has chronic diarrhea for which he is on Lomotil.  He reports  that his glucose has been well-controlled on his current dose of  metformin.  Otherwise nine system review of systems was obtained in  detail and was otherwise negative.   ALLERGIES:  No known drug allergies, though he has not tolerated LIPITOR  or CRESTOR secondary to myalgias and has been taken off of beta blockers  secondary to bradycardia.   MEDICATIONS:  1. Aspirin 81 mg p.o. daily.  2. Plavix 75 mg p.o. daily.  3. Pravastatin 40 mg p.o. daily.  4. Diovan 80 mg p.o. daily.  5. Zantac 150 mg p.o. daily  6. Advair 250/100 mg inhaled b.i.d.  7. Vitamin D 4000 international units b.i.d.  8. Lomotil 5 mg p.o. nightly.  9. Metformin 500 mg p.o. b.i.d.  10.Alprazolam 1 mg p.o. nightly.   PHYSICAL EXAM:  Afebrile, pulse 66, blood pressure 121/66, respiratory  rate 16, oxygen saturation 100% on room air.  GENERAL: He is a pleasant elderly man in no apparent distress.  HEENT: Normocephalic, atraumatic with pupils equal and reactive to light  and accommodation.  Extraocular movements intact.  Mucous membranes are  moist.  NECK:  Supple without lymphadenopathy, thyromegaly or bruits.  JVD is  not elevated.  LUNGS: Clear to auscultation bilaterally without wheezing, rhonchi,  rales.  CARDIOVASCULAR:  Heart sounds are very distant, but there is a normal S1  and S2 and no murmur appreciable.  SKIN: No rash or lesions.  ABDOMEN: Soft, nontender, nondistended with normal active bowel sounds.  No rebound or guarding, no hepatosplenomegaly.  EXTREMITIES: No clubbing, cyanosis or edema.  There are 2+ DP and PT  pulses bilaterally.  MUSCULOSKELETAL: Exam is unremarkable.  NEURO EXAM:  Unremarkable.   RADIOLOGY:   Chest x-ray shows no acute process.  EKG demonstrated sinus  tach with heart rate of 105, normal axis, normal intervals.  There were  insignificant inferior Q-waves unchanged from previous and nonspecific  anterolateral T-wave changes that were new compared to January 02, 2008.   LABORATORIES:  Notable for normal white count, hematocrit of  37,  potassium 3.7, BUN 18, creatinine 1.2, glucose of 82.  CK-MB was less  than assay.  Troponin-I less than assay, myoglobin 50.   ASSESSMENT/PLAN:  Mr. Riner is a 76 year old male with history of  coronary artery disease who presents with hypertension and symptoms  consistent with unstable angina.  Whether this is primarily an acute  coronary syndrome or hypertensive urgency is difficult to tell.  We will  cycle his cardiac enzymes and observe him on telemetry overnight.  We  will check lipids, A1c and TSH.  We will treat him with aspirin, Plavix,  statin and nitroglycerin p.r.n.  At this time we will hold on any beta  blockers as his rate is currently around 60 and he has had  history of  intolerance in the past.  We  will also hold on any anticoagulation  unless he rules in, as he is currently asymptomatic now that his blood  pressure is normal.  We will follow his blood pressure overnight and  cautiously titrate his medications given his history of recent low blood  pressure.  We will obtain an echocardiogram with indication being chest  pain and hypertension.  1. Diabetes.  We will hold his metformin in case he needs      catheterization or another dye load.  We will treat him with him      with sliding scale insulin.  2. History of tachycardia and palpitations.  His electrocardiogram      demonstrated sinus tachycardia, though he does have a history of      supraventricular tachycardia, possibly flutter.  We will follow him      on telemetry and check a TSH.  3. Would continue his other medications at this time.      Adela Ports,  MD  Electronically Signed     DWM/MEDQ  D:  07/21/2008  T:  07/21/2008  Job:  [id number]

## 2011-03-09 NOTE — Letter (Signed)
August 01, 2008    [name], MD  [address], [address]  [place], [place] [address]   RE:  [name], John  MRN:  [id number]  /  DOB:  Sep 06, 1935   Dear Annette Stable,   I had the pleasure of seeing Dayn Barich in the office today in a  followup visit.  In general, he is doing reasonably well.  Since he has  been discharge from the hospital, his heart rate has not gone up that  much.  I went back and reviewed his records in August 2008, he was on  fludrocortisone.  In February when I saw him back, he told me that his  fludrocortisone have been discontinued and that he had been started on a  antihypertensive agent because his blood pressure went up.  This sounds  like this was an appropriate intervention, particularly in light of a  rising blood pressure.  Rising blood pressure was probably related in  part to his salt water retention visa vi the drug.   PHYSICAL EXAMINATION:  VITAL SIGNS:  Today, the blood pressures is  88/56.  The pulse is 70.  LUNG:  Fields are clear.  CARDIAC:  Rhythm is regular.   His EKG is basically unchanged.   I have encouraged him to hold his Diovan at least for the present time.  It would be worthwhile to restart at later with the blood pressure of  88, I thought it would be also reasonable to liberalize his salt.  Without actually using fludrocortisone, I have encouraged him to remain  well  hydrated.  We will see him back in followup in 4 weeks.  I will be happy  to see him in the interim, if further problems would arise.  I  appreciate the opportunity sharing in his care.   ADDENDUM:  EKG reveals normal sinus rhythm with minimal voltage for LVH.     Sincerely,      [name]. Riley Kill, MD, [facility]  Electronically Signed    TDS/MedQ  DD: 08/01/2008  DT: 08/02/2008  Job #: [id number]

## 2011-03-09 NOTE — Letter (Signed)
February 05, 2008    [name], M.D.  [address], [address]  [place], [place] [address]   RE:  John [name], John [name]  MRN:  [id number]  /  DOB:  1935/02/25   Dear Annette Stable:   I had the pleasure of seeing John [name] in the office today in  followup.  Clinically he is getting along well.  He did hurt his neck on  Saturday, and requested some Vicodin, and I encouraged him to call your  office.  With regard to his kidney stones, he has not had any  recurrence.  He was admitted to the hospital, had a low potassium, and  did have chest pain.  As you know, he underwent reevaluation.  This  revealed patent LIMA to the LAD.  Medical therapy was recommended.  Since discharge he has not been having any syncope or presyncope.  He  did get a little lightheaded after a sugar load in which he got  hypoglycemic with a glucose of 40.   His medications include pravastatin 40 mg daily, Lyrica 75 mg daily,  potassium 10 mEq 2 tablets daily (taken for kidney stones), vitamin D  daily, Zantac 150 mg b.i.d., metformin 500 mg b.i.d. Diovan 160 mg 1/2  tablet daily.   PHYSICAL:  He is alert and oriented.  No distress.  Blood pressure 112/70, pulse 78.  Lung fields clear.  CARDIAC:  Rhythm is regular.   His electrocardiogram today reveals normal sinus rhythm with voltage  criteria for LVH, otherwise unremarkable.   We have taken the liberty of ordering a BMET because his potassium on  admission was 2.7.  We will see him back in followup in about 4 months.  I have encouraged him to continue to follow up with you for regular  blood checks.  We have encouraged him to remain hydrated.  I appreciate  the opportunity of sharing in his care.    Sincerely,      [name]. Riley Kill, MD, [facility]  Electronically Signed    TDS/MedQ  DD: 02/05/2008  DT: 02/05/2008  Job #: [id number]

## 2011-03-09 NOTE — Assessment & Plan Note (Signed)
John [name]                            CARDIOLOGY OFFICE NOTE   CLIFFTON, SPRADLEY                    MRN:          [id number]  DATE:11/26/2008                            DOB:          1934-12-19    John [name] is in for a followup visit.  To briefly summarize, he is  doing really pretty well.  He has not really had much in the way of  episodes and he is staying pretty well hydrated.  He does have a fair  amount of blood pressure fluctuation.  On occasion if his heart rate  gets up, he will take an extra atenolol.  If his blood pressure goes way  up, Dr. Clifton James has told him to take an extra Diovan.  He actually has,  however, been in good shape.  Without recurrent symptoms.   MEDICATIONS:  1. Pravastatin 40 mg daily.  2. Metformin 500 mg p.o. b.i.d.  3. Diovan 160 mg one-half daily.  4. Aspirin 81 mg daily.  5. Alprazolam 1 mg nightly.  6. Lomotil 2.52 daily.  7. Multivitamin daily.  8. Plavix 75 mg daily.  9. Vitamin D 2000 international units 2 b.i.d.  10.Potassium citrate 1080 mg b.i.d.  11.Zantac 150 mg daily.  12.Advair 250/100 one puff b.i.d.   PHYSICAL EXAMINATION:  GENERAL:  He is alert and oriented.  No distress.  VITAL SIGNS:  Blood pressure 132/80, the pulse was 66.  LUNGS:  The lung fields are clear.  CARDIAC:  Rhythm is regular.  EXTREMITIES:  No edema.   IMPRESSION:  1. Probable neurally mediated syncope and heart rate variability.  2. History of hypertension.  3. Coronary artery disease status post coronary bypass graft surgery.   PLAN:  1. Continue current medical regimen.  2. Return to clinic in approximately 3 months.  3. Call if there is change in status.     [name]. Riley Kill, MD, [facility]  Electronically Signed   TDS/MedQ  DD: 11/26/2008  DT: 11/27/2008  Job #: [id number]

## 2011-03-09 NOTE — H&P (Signed)
NAME:  John [name], John [name] NO.:  [id number]   MEDICAL RECORD NO.:  [id number]          PATIENT TYPE:  INP   LOCATION:  2926                         FACILITY:  [facility]   PHYSICIAN:  Reginia Forts, MD     DATE OF BIRTH:  1935-04-26   DATE OF ADMISSION:  12/30/2007  DATE OF DISCHARGE:                              HISTORY & PHYSICAL   CARDIOLOGIST:  [name]. Riley Kill, MD, [facility].   CHIEF COMPLAINT:  Chest pressure.   [name] is a 76 year old Caucasian male with a history of coronary  artery disease, status post CABG presents with chest pain for the last  10 hours.  The patient noted mild substernal chest pressure just prior  to attending a funeral today.  During and after the funeral the chest  pain progressed with progressive substernal pressure.  The patient  checks his blood pressure at home and noted it to be 203/112.  He then  took an old nitroglycerin sublingual with no relief.  He called EMS when  he became diaphoretic and short of breath.  EMS noted a blood pressure  of 190/100 and gave 3 sublingual nitroglycerin with mild improvement in  his symptoms.  Upon arrival to the emergency room, the patient received morphine and  nitroglycerin drip and his chest pain decreased from an 8 to a 3/10.  He  denied any orthopnea or paroxysmal nocturnal dyspnea.   PAST MEDICAL HISTORY:  1. Coronary artery disease.      a.     CABG in 1996 with a LIMA to the LAD and SVG to the D-1.      b.     Cardiac cath in 2002, demonstrated patent grafts with 80%       LAD, 80% first diagonal, and 30-40% RCA.  2. Hypercholesterolemia.  3. Non-insulin-dependent diabetes.  4. Neurally mediated syncope.      a.     Historically has been on fludrocortisone but has been       discontinued due to hypertension.  5. GERD with hiatal hernia.  6. COPD.  7. History of TIA.  8. History of SVT/atrial flutter with a subsequent negative EP study.  9. Peptic ulcer disease status post gastrectomy  and vagotomy.  10.History of subdural hematoma secondary to trauma in January 2006.  11.History of ureteral calculi.  12.Chronic diarrhea.   PAST SURGICAL HISTORY:  1. CABG as noted above.  2. TURP in July 2006.  3. Gastrectomy.  4. Vagotomy.  5. History of J stents into the ureters.   ALLERGIES:  No known drug allergies.   ADVERSE REACTIONS:  1. STATINS.  2. BETA-BLOCKER causing severe bradycardia.   MEDICATIONS:  1. Aspirin 81 mg p.o. daily.  2. Alprazolam 1 mg p.o. nightly  3. Ranitidine 150 mg p.o. b.i.d.  4. Lomotil 2.5 mg p.o. daily.  5. Centrum daily.  6. Plavix 75 mg p.o. daily.  7. Metformin 500 mg p.o. b.i.d.  8. Fluoxetine 40 mg p.o. daily.  9. Pravastatin 40 mg p.o. daily.  10.Diovan 160 mg p.o. daily.  11.Lyrica 75 mg p.o. daily.  12.Potassium chloride 20  mEq p.o. daily.  13.Advair Diskus daily.   SOCIAL HISTORY:  The patient lives in [place] with his wife.  He is  married with 2 children.  He quit tobacco in 1965 and denies any alcohol  or drug use.   FAMILY HISTORY:  Notable for father who died at age 61 secondary to  cerebral hemorrhage and a brother who has coronary disease and diabetes.  Twelve review of systems reviewed and negative.   PHYSICAL EXAMINATION:  VITAL SIGNS:  Temperature is afebrile, pulse 55,  blood pressure 119/64, respiratory rate is 16.  GENERAL:  The patient is awake, alert, oriented x3 in no acute distress.  HEENT:  Normocephalic atraumatic.  Pupils are equal, round, and reactive  to light.  Extraocular movements are intact.  There is mild ptosis of  the left eye noted.  NECK:  Shows no bruits or JVD.  CARDIOVASCULAR:  Regular rhythm.  Mild bradycardia with distant heart  sounds.  LUNGS:  Demonstrate decreased breath sounds diffusely but otherwise  clear to auscultation.  ABDOMEN:  Positive bowel sounds.  Soft, nontender, nondistended.  EXTREMITIES:  Reveal no clubbing, cyanosis, or edema.  MUSCULOSKELETAL:  Demonstrates  no joint effusions or tenderness.  NEUROLOGIC:  Cranial nerves II-XII grossly intact.  No focal  musculoskeletal or sensory deficits.  SKIN:  Demonstrates no rashes.  LYMPH:  Demonstrate no lymphadenopathy.  PSYCH:  Demonstrate normal affect.   Chest x-ray demonstrates mild pulmonary vascular congestion with a  tortuous aorta and underlying COPD.  EKG is a sinus bradycardia with a  heart rate of 57 and no ST-T wave changes.   LABORATORY:  White count 4.9, hemoglobin 13, platelet count 179,000.  BUN 12, creatinine 1.2, potassium 4.3.  Troponin less than 0.05.  CK is  35.6.  D-dimer is 0.33.   ASSESSMENT/PLAN:  This is a 76 year old Caucasian male with a history of  coronary artery disease and coronary artery bypass graft presents with  symptoms concerning for hypertensive urgency and acute coronary  syndrome.  1. Hypertensive urgency.  The patient will continue on his home      medications and continue nitroglycerin drip.  He may require      additional antihypertensive to attain full control of his      hypertension.  This is complicated by his neuro-mediated syncope      which has been relatively stable for the last 6 months.  2. Acute coronary syndrome.  The patient will be ruled out for      myocardial infarction.  The patient prefers cardiac cath over a      stress test if either were indicated.  We will start heparin.  3. Diabetes.  Metformin will be held.  Sliding scale insulin will be      initiated.      Reginia Forts, MD  Electronically Signed     RA/MEDQ  D:  12/30/2007  T:  12/31/2007  Job:  [id number]

## 2011-03-09 NOTE — Discharge Summary (Signed)
NAMEMarland Kitchen  SAFI, CULOTTA NO.:  [id number]   MEDICAL RECORD NO.:  [id number]          PATIENT TYPE:  INP   LOCATION:  2037                         FACILITY:  [facility]   PHYSICIAN:  [name]. Riley Kill, MD, FACCDATE OF BIRTH:  1934-10-27   DATE OF ADMISSION:  12/30/2007  DATE OF DISCHARGE:  01/02/2008                               DISCHARGE SUMMARY   PRIMARY CARDIOLOGIST:  Maisie Fus D. Riley Kill, MD, [facility]   DISCHARGE DIAGNOSIS:  Chest pain.   SECONDARY DIAGNOSES:  1. Coronary artery disease status post coronary artery bypass graft      times 2 in 1996 with a left internal mammary artery to the left      anterior descending and vein graft to the first diagonal.  2. Hypertension.  3. Hyperlipidemia.  4. History of neurally mediated syncope.  5. Gastroesophageal reflux disease.  6. Hiatal hernia.  7. Chronic obstructive pulmonary disease.  8. History of transient ischemic attack.  9. History of supraventricular tachycardia and atrial flutter with      negative EP study.  10.Peptic ulcer disease status post gastrectomy and vagotomy.  11.History of subdural hematoma secondary to trauma.  12.History of ureteral calculi.  13.History of chronic diarrhea.  14.Status post transurethral resection of the prostate.  15.Hypokalemia.   ALLERGIES:  BETA-BLOCKERS cause bradycardia.  He has had adverse  reactions to some statins.   HISTORY OF PRESENT ILLNESS:  This is a 76 year old Caucasian male with  prior history of  CAD status post CABG times 2 in 1996.  He was in usual  state of health until December 30, 2007 when he had sudden onset of  substernal chest discomfort and pressure in the setting of markedly  elevated blood pressure 203/112.  He took an old nitroglycerin without  relief and called EMS.  Upon EMS arrival, the patient was noted to be  diaphoretic and short of breath and was treated with 3 sublingual  nitroglycerin tablets with mild improvement.  He was then taken to the  [facility] ED for evaluation.  There, he was treated with IV morphine  and nitroglycerin infusion with reduction in blood pressure symptoms.  The patient was admitted for further evaluation.   HOSPITAL COURSE:  Mr. Daughdrill was ruled out for MI and had no recurrent  chest discomfort over the weekend although he had an episode of  hypoglycemia, hypotension, and bradycardia in the setting of hypokalemia  with a potassium of 2.7 early in the morning on December 31, 2007.  His  glucose and potassium were corrected and his ARB dosage was adjusted.  On March 9th, he underwent left heart cardiac catheterization revealing  a patent LIMA to the LAD with otherwise nonobstructive disease.  EF was  65%.  It was felt that the patient would best be managed with medical  therapy.  He has subsequently been maintained on his aspirin, Plavix,  and ARB.  Beta-blocker is not used secondary to prior history of  bradycardia.  When Mr. Pelzel has been ambulating without recurrence  of discomfort and will be discharged today in good condition.   DISCHARGE LABS:  Hemoglobin 13.0, hematocrit 37.5, WBC 5.7, platelets  181, MCV 94.3. Sodium 137, potassium 4.1, chloride 102, C02 28, BUN 14,  creatinine 1.1, and glucose 97.  INR 1.1.  Cardiac markers negative  times 3.  Total cholesterol  117, triglycerides 33, HDL 44, LDL 66,  calcium 8.8. Hemoglobin A1C 5.4.  D-dimer 0.33.   DISPOSITION:  The patient is being discharged home today in good  condition.   FOLLOWUP PLAN/APPOINTMENTS:  He is to followup with Dr. Riley Kill on April  13th at 10:15 a.m.   DISCHARGE MEDICATIONS:  1. Aspirin 81 mg every day.  2. Plavix 75 mg every day.  3. Pravastatin 40 mg q.h.s.  4. Lyrica 75 mg every day.  5. Diovan 160 mg every day.  6. K-Dur 10 mEq b.i.d.  7. Advair 250/50, 1 puff b.i.d.  8. Lomotil 2.5 mg every day.  9. Zantac 150 mg b.i.d.  10.Vitamin D, 6000 units every day.  11.Metformin 500 mg b.i.d. to be resumed January 03, 2008.  12.Xanax 1 mg daily p.r.n.  13.Nitroglycerin 0.4 mg sublingual p.r.n. chest pain.   OUTSTANDING LAB STUDIES:  None.      Nicolasa Ducking, ANP      [name]. Riley Kill, MD, [facility]  Electronically Signed    CB/MEDQ  D:  01/02/2008  T:  01/02/2008  Job:  [id number]

## 2011-03-09 NOTE — Discharge Summary (Signed)
NAME:  John [name], SHEWELL NO.:  [id number]   MEDICAL RECORD NO.:  [id number]          PATIENT TYPE:  INP   LOCATION:  2008                         FACILITY:  [facility]   PHYSICIAN:  [name]. Riley Kill, MD, FACCDATE OF BIRTH:  04-25-1935   DATE OF ADMISSION:  07/21/2008  DATE OF DISCHARGE:  07/24/2008                               DISCHARGE SUMMARY   PRIMARY CARDIOLOGIST:  Maisie Fus D. Riley Kill, MD, [facility]   PRIMARY CARE Eleshia Wooley:  [name], MD   DISCHARGE DIAGNOSIS:  Chest pain.   SECONDARY DIAGNOSES:  1. History of neurocardiogenic syncope.  2. Hypertension.  3. Hyperlipidemia.  4. Diabetes mellitus.  5. History of supraventricular tachycardia with questionable history      of atrial flutter.  6. Chronic obstructive pulmonary disease.  7. Gastroesophageal reflux disease.  8. Hiatal hernia.  9. Coronary artery disease status post coronary artery bypass graft      with most recent catheterization in March 2009 showing stable      disease with patent grafts.  10.History of transurethral prostatectomy.  11.Nephrolithiasis.  12.History of transient ischemic attack.  13.History of neurally-mediated syncope, previously on Florinef.   ALLERGIES:  He is intolerant to LIPITOR and CRESTOR.  BETA-BLOCKERS have  caused bradycardia in the past.   PROCEDURES:  None.   HISTORY OF PRESENT ILLNESS:  A 76 year old male with prior history of  CAD status post CABG as well has a history of neurogenic syncope, who  was in his usual state of health until July 21, 2008, when after  church, she began to experience intermittent chest pain and pressure  with associated dyspnea, nausea, and lightheadedness.  The symptoms  waxed and waned throughout the day.  EMS was eventually activated, and  he was found to be hypertensive with blood pressure of 172/112 and a  heart rate of 138 in sinus tachycardia.  He was treated with  nitroglycerin and aspirin and oxygen with partial relief  and taken to  the [facility] ED.  In he ED, he was given nitroglycerin infusion with  reduction in blood pressure and improved symptoms.  The patient was  admitted for further evaluation.   HOSPITAL COURSE:  In review of the patient's records, he had similar  presentation in March 2009, at which time, he had a cardiac  catheterization revealing patent grafts and stable coronary artery  disease.  At this time around, he ruled out for MI and had no recurrence  of symptoms.  It was felt that his symptoms were most likely  neurocardiogenic in nature.  In light of his recent catheterization in  March 2009, it was felt that he did not require additional ischemic  workup.   Mr. Nordquist has not had any recurrence in symptoms.  His blood pressure  and heart rates have fluctuated some, but overall, have been stable.  He  will be discharged home today in good condition.   DISCHARGE LABORATORY DATA:  Hemoglobin 12.8, hematocrit 37.7, WBC 5.6,  platelets 197, MCV 93.0.  Sodium 138, potassium 4.3, chloride 103, CO2  29, BUN 19, creatinine 1.19, glucose 114, INR 1.1.  Cardiac markers  negative x3.  Total cholesterol 127, triglycerides 75, HDL 45, LDL 67,  calcium 9.3.  Urinalysis negative.  TSH 1.543.   DISPOSITION:  The patient is being discharged home today in good  condition.   FOLLOWUP PLANS AND APPOINTMENTS:  The patient will follow up with Dr.  Shawnie Pons on August 01, 2008, at 3:15 p.m.  He was asked to follow  up with his primary care Elza Varricchio, Dr. Oneta Rack, as previously scheduled.   DISCHARGE MEDICATIONS:  1. Metformin 500 mg b.i.d.  2. Diovan 80 mg daily.  3. Aspirin 81 mg daily.  4. Pravastatin 40 mg daily.  5. Zantac 150 mg daily.  6. Advair 250/100 one puff b.i.d.  7. Vitamin D 4000 international units b.i.d.  8. Lomotil 5 mg daily.  9. Xanax 1 mg at bedtime.  10.Nitroglycerin 0.4 mg sublingual p.r.n. chest pain.   OUTSTANDING LABORATORY DATA AND STUDIES:  None.    DURATION OF DISCHARGE/ENCOUNTER:  45 minutes including physician time.      Nicolasa Ducking, ANP      [name]. Riley Kill, MD, [facility]  Electronically Signed    CB/MEDQ  D:  07/24/2008  T:  07/25/2008  Job:  [id number]   cc:   [name], M.D.

## 2011-03-09 NOTE — Assessment & Plan Note (Signed)
[facility]                            CARDIOLOGY OFFICE NOTE   CRANFORD, BLESSINGER                    MRN:          [id number]  DATE:09/09/2008                            DOB:          02/03/1935    [name] was in for followup visit.  Over the weekend, his blood  pressure went up, he was unsure what to do, so he did take one of his  blood pressure medicines.  In addition, he misunderstood our previous  instructions, and he has been using low-salt or no salt on his diet.  To  briefly summarize, the patient had been on fludrocortisone and then got  stopped.  He was placed on a blood pressure medicine.  Now, his blood  pressure seems to be at times back down, associated with a variety of  arrhythmias.   CURRENT MEDICATIONS:  1. Pravastatin 40 mg daily.  2. Metformin 500 b.i.d.  3. Diovan 160 mg which was on hold.  4. Aspirin 81 mg daily.  5. Alprazolam 1 mg nightly.  6. Lomotil 2.5 two daily.  7. Multivitamins daily.  8. Plavix 75 daily.  9. Vitamin D 2000 units 2 twice b.i.d.  10.Potassium.  11.Zantac.  12.Advair.   PHYSICAL EXAMINATION:  VITAL SIGNS:  Blood pressure is 108/70 and the  pulse is 83.  It is regular.  CARDIAC:  Rhythm is regular.  LUNGS:  The lung fields are clear.   The EKG reveals normal sinus rhythm with minimal voltage criteria for  LVH.   To summarize, the patient remained stable at the present time.  We will  plan to see him back in followup in about 2 weeks, but I have asked him  to liberalize his salt, and more specifically to take his pressure twice  a day.  He is going to get his blood pressure cuff for event and bring  that in.   Finally, there was a question of whether he might have peripheral  vascular disease.  He has, however, excellent pulses in lower  extremities.  We will readdress this when he returns.     [name]. Riley Kill, MD, [facility]  Electronically Signed   TDS/MedQ  DD: 09/09/2008  DT:  09/10/2008  Job #: [id number]

## 2011-03-09 NOTE — Assessment & Plan Note (Signed)
[facility]                            CARDIOLOGY OFFICE NOTE   SHRIYAN, ARAKAWA                    MRN:          [id number]  DATE:09/26/2008                            DOB:          1935-01-19    Mr. Grudzien is in for a followup visit.  In general, he has been doing  reasonably well.  His blood pressure has been variable about 4 days  after leaving here going up to a size 180/120, one time his heart rate  went up to about 140 and it did not change.  Since that, he was started  on half of atenolol.  We have talked him about maintaining an  appropriate level of sodium as he was previously on Florinef and did not  have much symptoms at that time.  He also had to require  antihypertensive therapy and he was subsequently stopped at that point  by Dr. Oneta Rack.  He seems to be doing better over the past several days.  Today, the blood pressure is 116/78, the pulse is 80.  The lung fields  are clear and the cardiac rhythm is regular.   We will continue with the current medical regimen.  I will see him back  in followup in another month or two to reassess the status.  He and I  have talked about some liberalization of his salt to try to interfere  which has been previously thought to be a form of dysautonomia.     [name]. Riley Kill, MD, [facility]  Electronically Signed    TDS/MedQ  DD: 10/03/2008  DT: 10/04/2008  Job #: [id number]

## 2011-03-09 NOTE — Assessment & Plan Note (Signed)
[facility]                            CARDIOLOGY OFFICE NOTE   John [name], John [name]                    MRN:          [id number]  DATE:09/26/2008                            DOB:          August 21, 1935    [name] is in for a followup visit.  After he left here last week,  he had a 2-3 more episodes; however, he has been liberalizing the salt  in his diet and he has been without symptoms for the past 10 days.  Referring to the note from Dr. Sampson Goon in November 2006, he laid out  well the findings of this patient.  The patient had orthostatic  tachycardia with subsequent hypotension after this.  The thought was  that this was a reflex arc in part due to volume contraction.  Florinef  had been prescribed, and the patient did in fact have a prolonged period  where he was symptom-free.  His hemoglobin came up, and the patient then  developed some hypertension and his Florinef was subsequently stopped.  The patient ensued by lowering the salt in his diet recently, thinking  that that would be a good thing.  He had more symptoms when I saw him  back the first time, blood pressure is in the 80s and then subsequently  in the 90s.  It is now up to about 116/78 with a pulse of 80.  He has  had no more symptoms.  His cardiac exam is otherwise unremarkable.   The patient continues to remain stable.  We will continue to follow him  up in the near future in next 2 months and he will continue on his  current medical regimen.     [name]. Riley Kill, MD, [facility]  Electronically Signed    TDS/MedQ  DD: 09/26/2008  DT: 09/27/2008  Job #: [id number]

## 2011-03-09 NOTE — Op Note (Signed)
NAME:  John [name], John [name]             ACCOUNT NO.:  [id number]   MEDICAL RECORD NO.:  [id number]          PATIENT TYPE:  AMB   LOCATION:  NESC                         FACILITY:  [facility]   PHYSICIAN:  Mark C. Vernie Ammons, M.D.  DATE OF BIRTH:  [birth year]   DATE OF PROCEDURE:  04/21/2009  DATE OF DISCHARGE:                               OPERATIVE REPORT   PREOPERATIVE DIAGNOSIS:  Bladder calculus.   POSTOPERATIVE DIAGNOSIS:  Bladder calculus.   PROCEDURE:  Cystolithalopaxy using laser lithotripsy.   SURGEON:  Mark C. Vernie Ammons, MD.   ANESTHESIA:  General.   BLOOD LOSS:  None.   DRAINS:  None.   SPECIMENS:  Stone given to patient.   COMPLICATIONS:  None.   INDICATIONS:  The patient is a 76 year old male who has had a history of  kidney stones.  He has recently been found to have a bladder calculus  and was having obstructive symptoms.  Cystoscopy revealed the stone was  lodged in the prostatic urethra and was pushed back in the bladder but  became lodged again and the patient has been having discomfort and  difficulty voiding.  The need for treatment with fragmentation of the  stone and its removal was discussed as well as the risks, complications  and alternatives.  He understands and has elected to proceed.  He  stopped his Plavix approximately a week ago.   DESCRIPTION OF OPERATION:  After informed consent, the patient was  brought to the major OR, placed on the table, administered general  anesthesia, then moved to the dorsal lithotomy position.  He received a  gram of Ancef preoperatively.  Genitalia was sterilely prepped and  draped and an official time-out was then performed.   A 21-French cystoscope with 12 degree lens was then passed down the  urethra which was noted to be normal.  The prostatic urethra revealed  that it was well resected with no bladder neck contracture.  There was a  lot of inflammation where the stone had been lodged at the level of  veru/apex of the  prostate.  Upon entering the bladder, I note the right  ureteral orifice appears normal, the left appears slightly lateral in  location and quite patulous.  The stone was identified on the floor of  the bladder and photographed.  Full and systematic inspection revealed  no tumors or inflammatory lesions.   The 1000 micron holmium laser fiber was then passed into the bladder and  this was used to completely fragment the stone.  I then used the Ellik  evacuator to remove all fragments from the bladder and reinspection  revealed no injury to the bladder, no bleeding and no residual stone  fragments remaining in the bladder using both the 12 and 70 degree lens  for inspection.   The bladder was drained, the cystoscope was removed and the patient was  awakened and taken to recovery room in stable and satisfactory  condition.  He tolerated the procedure well but there were no  intraoperative complications.   He has known bilateral renal calculi and is going to go on vacation, so  he requested a prescription for Demerol.  I gave him 100 mg tablets,  #20, and he will follow-up in my office on May 06, 2009.      Mark C. Vernie Ammons, M.D.  Electronically Signed     MCO/MEDQ  D:  04/21/2009  T:  04/21/2009  Job:  [id number]

## 2011-03-09 NOTE — Assessment & Plan Note (Signed)
[facility]                            CARDIOLOGY OFFICE NOTE   John [name], John [name]                      MRN:          [id number]  DATE:06/05/2007                            DOB:          11-23-1934    [name] is in for followup.  He is doing well.  There are no specific  complaints.  He has not had syncope or pre-syncope.  He feels well on  his current medical regimen.   His medications are as noted in the chart.  He remains on  fludrocortisone, 0.1 mg daily.   EXAM:  VITAL SIGNS:  Blood pressure is 104/64, the pulse 69.  LUNGS:  Fields are clear.  The sternotomy is healed.   His EKG reveals voltage criteria for left ventricular hypertrophy.   We reinforced the need to stay well hydrated.  He will return to see [place]  in followup in 6 months.  Should he have any problems in the interim, he  is to give [place] a call.     [name]. Riley Kill, MD, [facility]  Electronically Signed    TDS/MedQ  DD: 11/05/2007  DT: 11/05/2007  Job #: [id number]

## 2011-03-09 NOTE — Letter (Signed)
December 11, 2007    [name], M.D.  [address]  [place], [place] [address]   RE:  John, [name]  MRN:  [id number]  /  DOB:  1935-09-25   Dear Annette Stable:   Had the pleasure seeing John [name] in the office today in follow-up.  As you know, he is getting along reasonably well.  He has not been  having any syncope or presyncope.  As you know, he has previously seen  Yehuda Budd at [facility] for his syncope, and was  placed on salt-retaining agents.  He has continued to remain stable.  He  tells me his blood pressure went up, and that his fludrocortisone was  discontinued, and subsequently has been placed on Diovan.  He does feel  well and has not had any symptoms other than when he stands.   Currently, his medications include pravastatin 40 mg daily, Lyrica 75 mg  daily, Diovan 160 mg daily, potassium 10 mEq 2 tablets daily and vitamin  D daily.   PHYSICAL:  He is alert and oriented.  The blood pressure was 110/70 supine and also 110/70 standing.  Did not  drop.  Pulse 56.  The lung fields are clear.  The cardiac rhythm was regular.   Electrocardiogram demonstrates sinus bradycardia with minimal voltage  criteria for LVH, otherwise unremarkable.   John [name] appears to be stable.  Whether or not he will need  continuing antihypertensive therapy is unclear.  He is doing to continue  to follow up with you on a very close basis.  I have reinforced the need  to maintain good hydration.  He drinks a fair number of diet drinks.  Encouraged him not to drink drinks that have caffeine as this makes the  situation worse.  We  will not see him for a year in follow-up.  With and being on an ARB as  well as potassium it would be important to have his potassium followed.  I know that some of this is for his kidney stones.  I will defer to your  judgment on all of these issues.  We will see him in follow-up in 1 year  for continuing follow-up of his  coronary artery disease.  Thanks for  allowing [place] to share in his care.    Sincerely,      [name]. Riley Kill, MD, [facility]  Electronically Signed    TDS/MedQ  DD: 12/11/2007  DT: 12/11/2007  Job #: [id number]

## 2011-03-09 NOTE — Consult Note (Signed)
NAMEMarland Kitchen  John [name], John [name] NO.:  [id number]   MEDICAL RECORD NO.:  [id number]          PATIENT TYPE:  INP   LOCATION:  2037                         FACILITY:  [facility]   PHYSICIAN:  Rollene Rotunda, MD, FACCDATE OF BIRTH:  July 10, 1935   DATE OF CONSULTATION:  01/01/2008  DATE OF DISCHARGE:                                 CONSULTATION   PRIMARY CARE PHYSICIAN:  [name], M.D.   CARDIOLOGIST:  [name]. Riley Kill, MD, [facility]   PROCEDURES:  Left heart catheterization/coronary arteriography.   INDICATIONS:  Evaluate the patient with chest pain.  He had previous  CABG.  Chest pain was suggestive of unstable angina.   PROCEDURE:  Left heart catheterization was performed via right femoral  artery.  The artery was cannulated using anterior wall puncture.  A #6-  French arterial sheath was inserted via the modified Seldinger  technique.  Preformed Judkins and pigtail catheter were utilized.  The  patient tolerated the procedure well and left the lab in stable  condition.   RESULTS:  Hemodynamics:  LV 150/2, AO 150/68.  Coronaries:  Left main  was normal.  The LAD had proximal 70% stenosis.  There was a mid  diagonal which was large.  It was status post a LIMA.  There was  competitive flow from the LIMA to the diagonal.  This made it somewhat  difficult to visualize, but there was no clear obstructive lesion either  at the anastomosis or down stream.  Circumflex in the AV groove was  normal.  There was moderate size first obtuse marginal which was normal.  Second obtuse marginal was moderate size and normal.  Right coronary  artery was a large dominant vessel.  Diffuse luminal irregularities in  the proximal mid segment.  There was a mid 25% stenosis.  PDA was large  and normal.  Posterolateral x2 was small and normal.  Grafts of LIMA  which was sequential to the LAD and diagonal was widely patent.   LEFT VENTRICULOGRAM:  The left ventriculogram was obtained in the RAO  projection.  EF 65% normal wall motion.   CONCLUSION:  Single-vessel coronary artery disease with a widely patent  LIMA to the LAD and diagonal.  There was a well-preserved ejection  fraction.   PLAN:  The patient will have continued risk reduction.  No further  imaging is indicated.  He did have hypertensive urgency.  This will be  followed by Dr. Riley Kill.  In addition, he has had brady arrhythmias,  though this seems to be in the 50s.  Beta-blocker is being held.  His  rhythm will be followed.  He has no symptoms related to this.      Rollene Rotunda, MD, [facility]  Electronically Signed     JH/MEDQ  D:  01/01/2008  T:  01/02/2008  Job:  [id number]   cc:   [name], M.D.  [name]. Riley Kill, MD, [facility]

## 2011-03-12 NOTE — H&P (Signed)
NAME:  KIZER, NOBBE NO.:  [id number]   MEDICAL RECORD NO.:  [id number]          PATIENT TYPE:  INP   LOCATION:  2012                         FACILITY:  [facility]   PHYSICIAN:  Gertha Calkin, M.D.DATE OF BIRTH:  1935-02-18   DATE OF ADMISSION:  01/29/2005  DATE OF DISCHARGE:                                HISTORY & PHYSICAL   PRIMARY CARE PHYSICIAN:  [name], M.D.   CARDIOLOGIST:  [name]. Riley Kill, M.D. [facility].   NEUROSURGEON:  Clydene Fake, M.D.   This is a 75 year old, pleasant, Caucasian male with multiple medical  problems who presents with approximately 36 hours of lightheadedness with  standing.  Initially this began yesterday and was only occurring with  standing but then these symptoms never fully went away even when he sat down  or lay down.  He was seen by his primary physician yesterday in the clinic  and given medications and fluids and transiently these symptoms resolved.  However, he states that after returning home last night the symptoms  returned but were not as bad.  This morning he awoke to have the symptoms  present again, however, this time it was accompanied with chest pressure and  nausea as well as neck pain.  He denies any other symptoms such as  diaphoresis, palpitations, radiation of that chest pressure to any other  area on his thorax.  He was brought in after being advised by his primary  care physician to be seen in the ED, and during his ED initial assessment,  his chest pressure and symptoms were resolved after having nitroglycerin  given to him as well as aspirin.  Currently, he has no chest pressure,  however, he is still having symptoms of lightheadedness and dizziness with  sitting and standing.  He denies any prodromal symptoms prior to the onset  of the symptoms yesterday.  He has had a good appetite with no problems in  his fluid intake or nausea, vomiting, diarrhea, cough, or congestion.   PAST MEDICAL  HISTORY:  1. Coronary artery disease, status post CABG.  2. Anxiety.  3. BPH.  4. Gastroesophageal reflux disease with a history of peptic ulcer disease,      status post vagotomy and other surgeries.  5. History of multiple TIAs.  6. COPD.  7. Chronic diarrhea.  8. Questionable hyperglycemia/insulin resistance.     MEDICATIONS:  1. Zantac 150 mg p.o. b.i.d.  2. Flomax 0.4 mg p.o. every day.  3. Aspirin 81 mg two tablets every day.  4. Lipitor 40 mg p.o. q.h.s.  5. Xanax, he takes 0.25 mg t.i.d. prior to meals and 1 mg dose at night at      bedtime.  6. He takes Lomotil two tablets every night.     ALLERGIES:  None.   FAMILY HISTORY:  Noncontributory.   SOCIAL HISTORY:  He is married, lives with his wife in [place].  He was  a previous smoker approximately eight years, slightly less than a pack a day  for those years but has quit over 30 years now.  He denies any significant  alcohol  or other illicit substance use.   REVIEW OF SYSTEMS:  Positive only for the symptoms.  He denies any problems  with appetite, weight changes, weight loss, nausea, vomiting, diarrhea, does  not have problems with cough, congestion, sinus headaches pressure, no  problems with urinating or bowel movements, has not noticed any melanotic  stools, hematochezia, no hemoptysis, hematemesis.  He is not complaining of  shortness of breath with the onset of this chest pressure and no dyspnea on  exertion.  Other review of systems is negative.   PHYSICAL EXAMINATION:  VITAL SIGNS:  Temperature is 97.5, blood pressure  132/75, pulse 54, respirations of 18, 100% room air.  GENERAL:  This is a well-developed, well-nourished, Caucasian male lying in  bed in no acute distress.  HEENT:  Pupils are equal, round and reactive to light.  Extraocular  movements are intact.  Oropharynx is clear without exudate or erythema.  Tympanic membranes are clear.  NECK:  Supple without JVP.  No masses, no bruits.   CARDIOVASCULAR:  Regular rate and rhythm.  No murmurs, rubs or gallops.  CHEST:  Clear to auscultation bilaterally with good air movement.  ABDOMEN:  Soft, nontender, nondistended.  Bowel sounds are present.  EXTREMITIES:  Without clubbing, cyanosis, or edema.  NEUROLOGIC:  Cranial nerves:  He has no dysdiadochokinesia.  His cranial  nerves are intact.  His muscle strength is intact and symmetric.  Reflexes  are intact and symmetric 2+ upper and lower.  His pulses are 2+ bilaterally  upper and lower.  He has no problems in his balance.  His dorsal column are  intact.  His toes are downgoing.   LABS:  He has an EKG that shows a normal sinus rhythm, normal intervals,  normal axes.  No Q waves, no signs of ST-T changes.   White count of 3.7, hemoglobin 13.1, platelets 153.  Point-of-care markers x  2 negative.  I-STAT B-MET shows a sodium of 141, potassium 4.1, bicarb of  26, chloride of 108, glucose of 93, BUN of 10, creatinine 1.1.   CT of  his head without contrast was negative for any acute abnormalities.  It shows only minimal diffuse cerebral atrophy with minimal small vessel  disease.   Chest x-ray is no acute abnormalities noted.  No infiltrates.  Cardiac  silhouette is normal in size and no effusions.   ASSESSMENT:  1. Lightheadedness/dizziness.  2. Chest pressure.  3. History of coronary artery disease, status post coronary artery bypass      graft.  4. Questionable diabetes/hyperglycemia.  5. Anxiety.  6. Benign prostatic hypertrophy.  7. Gastroesophageal reflux disease and known history of peptic ulcer      disease, status post vagotomy.  8. History of multiple transient ischemic attacks.  9. Chronic obstructive pulmonary disease.  10.Chronic diarrhea.     PLAN:  1. Admit to a tele bed to rule out any arrhythmias.  2. Check cardiac enzymes to rule out any cardiac insult/injury. 3. We will check an MRI/MRA to determine whether or not he has suffered an      acute  insult with his known history of TIAs.  4. Orthostatic blood pressures were done in the ED and were negative.  5. He states that there have been no changes in his medications and      therefore, we will resume his BPH medications as well as his other      meds.  6. I am holding off getting carotid or transcranial Doppler  studies until      we obtain office records because he feels that he may have had some      done recently in Dr. Rosalyn Charters office.  7. Otherwise we will provide DVT and GI prophylaxis.  8. Note, he is off of his Coumadin which was started initially after      failing Plavix with a TIA while he was on Plavix.  I have instructed      him to consider discussing with his primary care physician the use of      Aggrenox instead with minimal increase in bleeding compared to      Coumadin.  The results of the MRI may dictate whether or not his      risk/benefits would favor resuming anticoagulation since he had a      recent history of I believe a Subdural hemorrhagic bleed secondary to      trauma back in January 2006.  However, this needs to be discussed with      cardiology if this is the case.  Cardiology may be consulted and I have      also discussed with the patient the possibility that this could be just      postural hypotension.        JD/MEDQ  D:  01/29/2005  T:  01/30/2005  Job:  [id number]   cc:   [name], M.D.  [address], [address]  [place], [place] [address]  Fax: [phone]   [name]. Riley Kill, M.D. [facility]   Clydene Fake, M.D.  [address]  [place]  [place] [address]  Fax: [phone]

## 2011-03-12 NOTE — H&P (Signed)
NAME:  John [name], John [name] NO.:  [id number]   MEDICAL RECORD NO.:  [id number]          PATIENT TYPE:  INP   LOCATION:  1827                         FACILITY:  [facility]   PHYSICIAN:  Rollene Rotunda, M.D.   DATE OF BIRTH:  03-23-1935   DATE OF ADMISSION:  03/15/2005  DATE OF DISCHARGE:                                HISTORY & PHYSICAL   PRIMARY CARE PHYSICIAN:  [name], M.D.   CARDIOLOGIST:  [name]. Riley Kill, M.D.   REASON FOR ADMISSION:  Evaluate the patient with atrial flutter and rapid  ventricular rate.   HISTORY OF PRESENT ILLNESS:  The patient is a very pleasant 76 year old  gentleman with a history of coronary disease.  Recently, he has had  palpitations associated with some chest discomfort.  He was admitted in mid  April.  He ruled out for myocardial infarction and had a stress perfusion  study which was negative for any evidence of ischemic, with an EF of 61%.  He has had an event recorder placed.  This apparently demonstrated atrial  flutter with a rapid rate (per Dr. Riley Kill).  The patient has had recurrence  of these and was asked to come to the emergency room tonight.  When he has  them, he can feel his heart racing.  He feels very weak.  He will get some  chest discomfort.  He has not had any presyncope or syncope.  They have been  happening 2-10 times per day.  They are less frequent when he has been  laying down.  He has been having some fleeting chest discomfort that is  unchanged from prior to his recent evaluation.  He can remain active without  bringing out any symptoms, other than the palpitations, as mentioned.  He  has not been getting any neck or arm discomfort.  He has not been having any  significant shortness of breath, PND, or orthopnea.   PAST MEDICAL HISTORY:  1.  Coronary artery disease, status post CABG (catheterization 2000 with a      LIMA to the LAD sequential diagonal patent, RCA 30% to 40% stenosis, LAD      80% stenosis,  first diagonal 80% stenosis, circumflex normal).  Well      preserved ejection fraction.  2.  Hyperlipidemia.  3.  Diabetes mellitus (borderline).  4.  Subdermal hematoma secondary to trauma in January 2006.  5.  Transient ischemic attack.  6.  Benign prostatic hypertrophy.  7.  Anxiety.  8.  Chronic obstructive pulmonary disease.  9.  Chronic diarrhea.  10. Peptic ulcer disease.  11. Bradycardia related to beta blockers.   PAST SURGICAL HISTORY:  1.  Status post gastrectomy and vagotomy.  2.  CABG in 1996.   ALLERGIES:  1.  The patient had bradycardia with BETA BLOCKERS.  2.  Muscle aches with STATINS.   MEDICATIONS:  1.  Glucophage (he is not sure of the dose and does not take it all the      time).  2.  Advair.  3.  Albuterol.  4.  Imodium 2 tablets q night.  5.  Zoloft 50 mg daily.  6.  Xanax 0.25 mg q.a.c. and q.h.s.  7.  Lipitor 40 mg daily.  8.  Aspirin 81 mg daily.  9.  Flomax 0.4 mg daily.  10. Zantac 150 mg b.i.d.   SOCIAL HISTORY:  The patient lives in [place] with his wife.  He is  retired from __________.  He quit smoking 30 years ago.  He does not drink  alcohol.   FAMILY HISTORY:  Noncontributory for early coronary artery disease or sudden  cardiac death.   REVIEW OF SYSTEMS:  As stated in the HPI, and negative for all other  systems.   PHYSICAL EXAMINATION:  GENERAL:  The patient is in no distress.  VITAL SIGNS:  Blood pressure 130/80, heart rate 70s and regular.  Affect  appropriate.  HEENT:  Eyes unremarkable.  Pupils equal, round and reactive to light.  Fundi not visualized.  Oral mucosa unremarkable.  NECK:  No jugular venous distention.  Waveform within normal limits.  Carotid upstroke brisk and symmetric.  There were no bruits, no thyromegaly.  LYMPHATICS:  No cervical, axillary, or inguinal adenopathy.  LUNGS:  Clear to auscultation bilaterally.  BACK:  No costovertebral angle tenderness.  CHEST:  Unremarkable.  HEART:  PMI not  displaced or sustained.  S1 and S2 within normal limits.  No  S3, no S4, no murmurs.  ABDOMEN:  Flat.  Positive bowel sounds.  Normal in frequency and pitch.  No  bruits, rebound, or guarding.  No midline pulse.  No mass, hepatomegaly, or  splenomegaly.  SKIN:  No rashes, no nodules.  EXTREMITIES:  There were 2+ pulses throughout.  No edema, no cyanosis, no  clubbing.  NEUROLOGIC:  Oriented to person, place, and time.  Cranial nerves II-XII  grossly intact.  Motor grossly intact.   EKG revealed sinus rhythm, rate 64.  Axis within normal limits.  Intervals  within normal limits.  No acute ST-T wave change.   ASSESSMENT AND PLAN:  1.  Atrial flutter.  The patient had atrial flutter on the monitor.  It was      quite symptomatic.  At this point, we will admit him to telemetry.  I      would gingerly start calcium channel blockers if he develops recurrent      sustained tachyarrhythmias.  Will consult EP to consider flutter      ablation.  Of note, will need to reconsider Coumadin.  We might want to      consult with the neurosurgeons.  The patient suggested he was cleared to      take Coumadin again after 3 months if it was clinically indicated.  He      had his event and his subdural hematoma in January.  2.  Coronary disease.  He recently had __________, and no further work-up is      needed.  3.  Diabetes.  I will go ahead and start his Glucophage, holding it when he      is NPO.  I will start it at 500 mg a day.  Will also cover him with      sliding scale insulin as needed.  4.  Dyslipidemia.  Will continue on his statin, which he seems to be      tolerating.      JH/MEDQ  D:  03/15/2005  T:  03/15/2005  Job:  [id number]   cc:   [name], M.D.  [address]  [place], [place] [address]  Fax: [phone]   [name]. Riley Kill, M.D. [facility]

## 2011-03-12 NOTE — Cardiovascular Report (Signed)
[place]. [facility]  Patient:    John [name], John [name]                    MRN: [id number] Proc. Date: 01/06/01 Adm. Date:  [id number] Attending:  Ronaldo Miyamoto CC:         CV Laboratory  Marinus Maw, M.D.   Cardiac Catheterization  INDICATIONS:  [name] is a delightful 76 year old, who presents with a recurrent episode of chest discomfort.  He has had prior revascularization surgery.  The current study was done to reaccess his coronary anatomy.  PROCEDURES: 1. Left heart catheterization. 2. Selective coronary arteriography. 3. Selective left ventriculography. 4. Internal mammary angiography.  DESCRIPTION OF PROCEDURE:  The procedure was performed from the right femoral artery using 6 French catheters.  Because of borderline hypotension the intravenous nitroglycerin was turned off.  Intravenous fluids were administered.  He was able to complete the procedure without complication.  HEMODYNAMICS:  The initial central aortic pressure was 86/57. LV pressure was subsequently 112/4.  There was no gradient on pullback across the aortic valve.  ANGIOGRAPHIC DATA:  The left main coronary artery is free of significant disease.  The LAD coursed to the apex.  There was about 80% segmental narrowing at the origin of the large diagonal branch.  The large diagonal branch itself had an eccentric 80% plaque.  The internal mammary to the diagonal subsequently going to the LAD was patent. There was perhaps irregularity beyond the insertion site at the diagonal leading into the LAD segment but this was not focally significant.  Runoff into both diagonal and LAD distally was without significant narrowing.  The circumflex proper provided both two marginal branches and an AV circumflex.  There was minimal luminal irregularity but no high-grade stenosis.  The right coronary artery was a dominant vessel providing posterior descending and  posterolateral branch.  There is perhaps 30-40% narrowing throughout the whole proximal and mid segment.  However, high-grade focal disease was not noted and this does not appear to be flow-limiting.  VENTRICULOGRAPHY:  Ventriculography in the RAO projection reveals preserved global systolic function.  Calculated ejection fraction was 50% but visually appeared to be more in the range of 55-60%.  CONCLUSIONS: 1. Preserved overall left ventricular function. 2. Continued patency of the internal mammary to the diagonal and    left anterior descending. 3. Moderate luminal plaquing that does not appear to be flow-limiting    involving the proximal right coronary artery.  DISPOSITION:  The patient will be treated medically.  We will try to reassure him. DD:  01/06/01 TD:  01/06/01 Job: [id number]

## 2011-03-12 NOTE — Consult Note (Signed)
NAME:  John [name], John [name] NO.:  [id number]   MEDICAL RECORD NO.:  [id number]          PATIENT TYPE:  INP   LOCATION:  1432                         FACILITY:  [facility]   PHYSICIAN:  Charlies Constable, M.D. [facility] DATE OF BIRTH:  1934/12/01   DATE OF CONSULTATION:  04/30/2005  DATE OF DISCHARGE:                                   CONSULTATION   CONSULTING PHYSICIAN:  Dr. Eudelia Bunch   PRIMARY CARE PHYSICIAN:  Dr. Marlowe Shores   CARDIOLOGIST:  Dr. Bonnee Quin   REASON FOR CONSULTATION:  Evaluation of syncope.   CLINICAL HISTORY:  John [name] is 76 years old and has had previous bypass  surgery in 1996 and had catheterization in 2002 at which time his grafts  were patent.  He had a Cardiolite scan done in April which showed good left  ventricular function with an ejection fraction of 61% and no ischemia.   Recently, he underwent a procedure to remove stones and had ureteral  stenting.  He had to come back to the emergency room after discharge for  reinsertion of a Foley catheter because of urinary outflow obstruction.  On  the fourth of July with his indwelling catheter in place he went to the  bathroom and then was stooping over to adjust his catheter and then he  turned around and suddenly blacked out.  There was very little warning.  He  had no associated chest pain, shortness of breath, or palpitations after he  recovered.  He felt badly over the next couple days and saw Dr. Logan Bores who  arranged for him to come into the hospital to help evaluate and treat his  bladder outlet obstruction.   John [name] also has a history of atrial flutter.  This was documented on  event monitoring.  Dr. Ladona Ridgel attempted to ablate him, but was unable to  induce the atrial flutter so he was unable to ablate him.  Dr. Riley Kill has  arranged for him to see Dr. Sampson Goon for EP studies at [facility] in August.   PAST MEDICAL HISTORY:  1.  Peptic ulcer disease.  2.  COPD.  3.  History of TID with  amaurosis fugax.  4.  BPH.  5.  Hyperlipidemia.   CURRENT MEDICATIONS:  1.  Lipitor 40 mg daily.  2.  Pepcid 20 mg b.i.d.  3.  Zoloft 50 mg daily.  4.  Glucophage 500 mg which is on hold.  5.  Pindolol 2.5 mg daily.  This was b.i.d. prior to admission.   For details of family history, review of systems, and social history please  see John [name]'s complete note.   PHYSICAL EXAMINATION:  VITAL SIGNS:  Blood pressure 105/64, pulse 74 and  regular, respiratory rate 20.  There was no venous distention.  The carotid  pulses were full and there were no bruits.  CHEST:  Clear without rales or rhonchi.  CARDIAC:  Regular.  The heart sounds were normal and there were no murmurs  or gallops.  ABDOMEN:  Soft with normal bowel sounds.  There was no hepatosplenomegaly.  EXTREMITIES:  Good pulses.  There was no peripheral edema.  MUSCULOSKELETAL:  No deformities.  SKIN:  Warm and dry.  NEUROLOGIC:  No focal neurological signs.   He had a head CT at an outside facility which was reported normal.   IMPRESSION:  1.  Recent syncope probably secondary to postural hypotension aggravated by      Flomax.  2.  Urinary retention and status post recent stone removal and ureteral      stenting.  3.  Coronary artery disease status post coronary artery bypass graft 1996      with patent grafts in 2002 and a negative Cardiolite scan with good left      ventricular function April 2006.  4.  History of atrial flutter with unsuccessful attempt at induction for      ablation here now treated with pindolol with plan for EP studies at      [facility] in August.  5.  Previous anticoagulation therapy.   RECOMMENDATIONS:  I would not recommend any further cardiac evaluation at  the present time except to continue to watch the patient on telemetry for  another 48 hours.  He did have one episode of four wide beats but they were  quite slow and I do not think these have any major significance related to  his  recent symptoms.  If he has no recurrent symptoms and if we do not see  any serious arrhythmias on telemetry then I would not recommend any further  evaluation at the present time in view of his recent negative Cardiolite  scan and good LV function.       BB/MEDQ  D:  04/30/2005  T:  04/30/2005  Job:  [id number]   cc:   [name], M.D.  [address]  [place], [place] [address]  Fax: [phone]   [name]. Riley Kill, M.D. [facility]

## 2011-03-12 NOTE — Cardiovascular Report (Signed)
[place]. [facility]  Patient:    John [name], John [name]                    MRN: [id number] Proc. Date: 03/14/00 Adm. Date:  [id number] Disc. Date: [id number] Attending:  Rollene Rotunda CC:         Cardiac Catheterization Lab             Marinus Maw, M.D.                        Cardiac Catheterization  INDICATIONS:  [name] is well known to me.  He is 76 years old.  He has had previous bypass surgery.  He presented with severe chest pain.  He was reluctant to have a Cardiolite study in part because of his wifes prior experience.  She previously had had a normal Cardiolite three days before having multi-vessel bypass surgery.  The pain was also severe, and he wanted to proceed with cardiac catheterization.  PROCEDURE:  Left heart catheterization, selective coronary angiography, selective left ventriculography, internal mammary angiography.  DESCRIPTION OF PROCEDURE:  The procedure was performed from the right femoral artery using 6-French catheters.  She tolerated the procedure without complication.  HEMODYNAMIC DATA:  The central aortic pressure was 131/74.  LV pressure was 128/23.  No gradient on pullback across the aortic valve.  ANGIOGRAPHIC DATA: 1. The left main coronary artery is free of critical disease. 2. The left anterior descending artery has about 70% narrowing beyond the    origin of the diagonal.  The diagonal itself has about an 80% narrowing. 3. The left internal mammary artery to the diagonal and LAD is widely patent.    There is perhaps 40% narrowing just beyond the diagonal insertion site, but    this does not appear to be high grade.  It inserts distally into the LAD    which is widely patent as well. 4. The circumflex consists of two marginal branches and an AV circumflex.  The    first marginal branch has a somewhat eccentric takeoff from the main vessel    and perhaps has mild irregularity at the ostium site; however, no  high    grade lesions are noted. 5. The right coronary artery demonstrates about 40% narrowing near the    junction of the proximal mid vessel.  There is a mild area of eccentric 20%    plaquing in the mid vessel as well.  The distal vessel was widely patent.    Ejection fraction is 55%.  CONLUSIONS: 1. Normal left ventricular function.  Ejection fraction 55%. 2. Continued patency of the diagonal LAD graft. 3. Mild disease of the right coronary artery as described above.  DISPOSITION: 1. Continue lipid-lowering therapy. 2. ABG, pO2 of 72. 3. Obtain VQ scan. DD:  03/15/00 TD:  03/18/00 Job: [id number]

## 2011-03-12 NOTE — Op Note (Signed)
NAME:  John [name], John [name] NO.:  [id number]   MEDICAL RECORD NO.:  [id number]          PATIENT TYPE:  AMB   LOCATION:  DAY                          FACILITY:  [facility]   PHYSICIAN:  Jamison Neighbor, M.D.  DATE OF BIRTH:  1935-02-13   DATE OF PROCEDURE:  04/23/2005  DATE OF DISCHARGE:                                 OPERATIVE REPORT   PREOPERATIVE DIAGNOSIS:  Left flank pain, probable ureteral calculus.   POSTOPERATIVE DIAGNOSIS:  Left ureteral calculus.   PROCEDURE:  Cystoscopy, left retrograde, left ureteroscopy with basket  extraction, left double-J catheter insertion.   SURGEON:  Jamison Neighbor, M.D.   ANESTHESIA:  General.   COMPLICATIONS:  None.   DRAINS:  An 8.5 French double-J catheter.   HISTORY:  This 76 year old male has had a lifelong history with kidney  stones.  He has literally passed hundreds of stones.  In the past, the  patient had a ureteral lithotomy done many years ago.  This caused  obstruction of the proximal ureter and eventually required revision of that  to reroute the ureter and make it in a more independent position.  That,  combined with the judicious use of medication, did seem to improve his stone  formation and cut down on the number of stones that he passed.   He has stopped his medication and seems to have increased the number of  stones he is making.  He has developed recent pain on the left-hand side.  A  KUB did not show an obvious stone; however, the patient has had gross  hematuria and is unable to pass the calculus.  The patient is now to undergo  ureteroscopy and possible stent placement with extraction of a stone that is  present.  Patient understands the risks and benefits of the procedure and  gave full informed consent.   PROCEDURE:  After successful induction of general anesthesia, the patient is  placed in the dorsal lithotomy position, prepped with Betadine, and draped  in the usual sterile fashion.  Cystoscopy  was performed.  The urethra was  visualized and seen in its entirety.  Found to be normal beyond the  verumontanum.  There was prostatic enlargement, including some extension  into the bladder.  This did cause some J-hooking of the ureters, making it  very difficult to cannulate.  The bladder itself showed trabeculation due to  longstanding bladder outlet obstruction.  The right ureteral orifice was  normal in configuration and location.  The left ureteral orifice showed  signs of previous surgical intervention.  It was a little difficult to get  the ureteral catheter to the ureter, but this was eventually accomplished,  and a retrograde study showed what appeared to be some obstruction in the  distal ureter.  A guidewire was passed up to the kidney.  It should be noted  that the area where the ureter had been operated on was no longer kinked or  obstructed.  The patient does have evidence of hydronephrosis on that side.  The guidewire was passed up to the kidney, where it coiled normally.  The  retrograde was completed.  The ureteroscope was inserted and passed  alongside the guidewire.  In the bottom third of the ureter, a large stone  was identified.  Since the ureter was somewhat dilated, it was felt this  could be extracted, and this was extracted without any difficulty.  It  should be noted that when the cystoscope was first inserted, two other small  stones were retrieved for a total of 3.  The cystoscope was back-loaded over  the wire.  An 8.5 French stent was then passed up to the kidney, where it  coiled normally and also coiled normally within the bladder.  This large  stent was placed in order to dilate the ureter and allow additional stones  to pass should they form.  The patient tolerated the procedure and was taken  to the recovery room in good condition.  He will be given a prescription for  Demerol, which is the only pain medicine he can tolerate, Pyridium Plus to  take as  needed for pain, and Septra DS.  Return to the office for followup  and stent removal.     _______________    RJE/MEDQ  D:  04/23/2005  T:  04/23/2005  Job:  [id number]

## 2011-03-12 NOTE — H&P (Signed)
NAME:  John [name], John [name] NO.:  [id number]   MEDICAL RECORD NO.:  [id number]          PATIENT TYPE:  INP   LOCATION:  1432                         FACILITY:  [facility]   PHYSICIAN:  Jamison Neighbor, M.D.  DATE OF BIRTH:  [birth year]   DATE OF ADMISSION:  04/29/2005  DATE OF DISCHARGE:                                HISTORY & PHYSICAL   ADMISSION DIAGNOSES:  1.  Dehydration.  2.  Nausea.  3.  Urinary retention.  4.  History of cardiac arrhythmias.  5.  Diabetes.  6.  History of renal calculi, status post recent ureteroscopy and basket      extraction x3.   HISTORY:  This 76 year old male is very well known to me.  He has a  longstanding of multiple renal calculi.  The patient has passed well over  200 stones.  For some time, we were able to keep his stone formation under  good control with a combination of Uricit-10 and hydrochlorothiazide;  however, the patient stopped the medications without my knowledge.  Recently, he has had an increasing number of stones and not long ago  presented to the office with at least three stones.  The patient underwent  ureteroscopy, where he had these stones extracted.  He had a stent left in  place.  The patient developed problems with postoperative urinary retention  and was eventually sent home with a catheter.   He has failed one voiding trial.  He was coming to the office today for a  second voiding trial.  The patient asked the nurse________ if the catheter  was removed, but it was clear that the patient felt quite ill.  He says it  is the worst that he has felt in some time.  He really could not articulate  why he felt poorly, but I was concerned that he looked dehydrated.  He  seemed listless, and I wanted to be sure that he did not have any  complication from the recent ureteroscopy and also that he was not having  any problems with his heart.  I spoke to Dr. Oneta Rack, his internist, who  notes that the patient does have a  cardiac history, that he has had problems  with hypotension in the past due to medications and that he is scheduled to  have a second opinion done at [facility] for further evaluation of his  arrhythmia.  Because the patient felt quite ill, he was admitted for  rehydration, medical evaluation, as well as cardiac evaluation.  From a  urologic standpoint, we will leave a Foley catheter in place.  He is aware  of the fact that since he has failed multiple voiding trials, he will  eventually require a TURP or possibly laser surgery on the prostate.  We  note that he has been on Flomax, but I am going to discontinue that because  of his problems with hypertension and possible fatigue.  Due to the Flomax,  the patient is aware of the fact that he is going to need something done  with his prostate once he is stabilized.  The patient's past medical history is remarkable for diabetes.  He currently  takes Glucophage.  He is also on Metformin.  The Metformin is being held  during this hospital stay.  His other medications are Zantac, Flomax,  aspirin, Lipitor at 40 mg daily, Xanax p.r.n., Lomotil, Zoloft.   His previous surgeries include multiple stent extractions.  We also had to  actually do surgery to treat his proximal ureter where he had a stricture  from previous open ureteral lithotomy.  He has had a CABG x2.  He has had a  partial gastrectomy.  At one point in time, he had a sebaceous large cyst  removed his right neck.  The patient is known to have problems with  hypertension as well as atrial flutter with a rapid ventricular rate.  He  also has hypercholesterolemia.  The patient does have a history of TIA as  well as a history of subdural hematoma secondary to trauma.  He is a non-  insulin-dependent diabetic.  He is known to have chronic diarrhea as well as  peptic ulcer disease.  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   The patient does not use  tobacco, as he stopped many years ago.  He does not  use alcohol.   FAMILY HISTORY:  Noncontributory.   SOCIAL HISTORY:  Noncontributory.   REVIEW OF SYSTEMS:  Noncontributory.   PHYSICAL EXAMINATION:  VITAL SIGNS:  Temperature 98.1, pulse 68,  respirations 18, blood pressure 114/68.  GENERAL:  He is a well-developed and well-nourished male who does appear  ill, pale, and somewhat dehydrated.  HEENT/NECK:  Normocephalic and atraumatic.  Patient does have a recurrent  sebaceous cyst on the right neck.  The oropharynx was unremarkable, but the  mucosa was somewhat dry.  Otherwise, his cranial nerves II-XII appear  grossly intact.  LUNGS:  Clear.  HEART:  Regular rate and rhythm with no murmurs, thrills, gallops, rubs, or  heaves.  ABDOMEN:  Soft and nontender.  No palpable masses, rebound or guarding.  There is a little tenderness on the left-hand side secondary to the stent.  EXTREMITIES:  No clubbing, cyanosis or edema.  NEUROLOGIC:  grossly intact.  GU:  Not repeated.   I will contact cardiology for evaluation of the patient.  He will be placed  on telemetry.  We will also obtain a consult from the medical hospitalist  service.     _______________    RJE/MEDQ  D:  04/29/2005  T:  04/29/2005  Job:  [id number]

## 2011-03-12 NOTE — Assessment & Plan Note (Signed)
[facility]                            CARDIOLOGY OFFICE NOTE   XIONG, HAIDAR                    MRN:          [id number]  DATE:10/31/2006                            DOB:          [birth year]    [name] is in for followup.  He generally is doing quite well.  He  has not seen Dr. Sampson Goon since seeing [place] last.  He has had no syncope  or presyncope and continues to tolerate fludrocortisone without any  difficulty.  He denies any ongoing chest pain.   His most recent laboratory studies done at Dr. Kathryne Sharper office reveal  an LDL cholesterol of 50, total cholesterol is 131, hemoglobin is  normal.   His medications include:  1. Enteric-coated aspirin 81 mg daily.  2. Alprazolam 1 mg q.h.s.  3. Ranitidine 150 b.i.d.  4. Lomotil 2.5 daily.  5. Fludrocortisone 0.1 mg daily.  6. Centrum vitamin.  7. Plavix 75 mg daily.  8. Metformin 500 mg morning and evening.  9. Fluoxetine 40 mg daily.  10.Pravastatin 40 mg one-half tablet daily.  11.Urocit-K 10 mg twice a day for kidney stones.   On examination, blood pressure is 115/68, pulse 60.  Lung fields clear,  cardiac rhythm is regular.  No murmurs noted.  There is no significant  drop with standing.   EKG reveals normal sinus rhythm, essentially within normal limits.   IMPRESSION:  1. Coronary artery disease status post coronary artery bypass graft      surgery.  2. Hypercholesterolemia, on lipid-lowering therapy.  3. Non-insulin-dependent diabetes.  4. Neurally-mediated syncope, on fludrocortisone.   PLAN:  1. Return to clinic in 6 months.  2. I recommended that he have a basic metabolic profile done at Dr.      Kathryne Sharper office.  The patient will arrange this.     [name]. Riley Kill, MD, [facility]  Electronically Signed    TDS/MedQ  DD: 10/31/2006  DT: 10/31/2006  Job #: [id number]   cc:   [name], M.D.

## 2011-03-12 NOTE — H&P (Signed)
NAME:  John [name], John [name] NO.:  [id number]   MEDICAL RECORD NO.:  [id number]          PATIENT TYPE:  EMS   LOCATION:  MAJO                         FACILITY:  [facility]   PHYSICIAN:  Jonelle Sidle, M.D. LHCDATE OF BIRTH:  [birth year]   DATE OF ADMISSION:  02/09/2005  DATE OF DISCHARGE:                                HISTORY & PHYSICAL   PRIMARY CARE PHYSICIAN:  [name], M.D.   PRIMARY CARDIOLOGIST:  [name]. Riley Kill, M.D. [facility]   CHIEF COMPLAINT:  Chest pain.   HISTORY OF PRESENT ILLNESS:  John [name] is a 76 year old male with a  history of bypass surgery in 1996 who was last catheterized in 2002 and  whose last stress test was in 2004. Recently he has had problems with  increased heart rate and blood pressure on an episodic basis. He is not  aware of any specific stimulus that causes these and he feels the  palpitations, so he takes his heart rate and blood pressure which are both  elevated. These symptoms can last for hours and then will eventually  resolve. At rest, especially in the morning his heart rate can drop into the  40s, but is generally maintained in the 50s or better. He had these symptoms  today, but associated with them was substernal pain. He felt that the pain  was indigestion. He was concerned about, however, and took sublingual  nitroglycerin. The pain resolved with two sublingual nitroglycerin.  He  denies shortness of breath, nausea, vomiting, or diaphoresis with this. He  has had similar symptoms prior to his bypass surgery and prior to his last  catheterization. He was pain free at the time of exam.   PAST MEDICAL HISTORY:  1.  Status post aortocoronary bypass surgery in 1996 with LIMA to LAD and      sequential to diagonal-1.  2.  Status post catheterization in 2002 with an 80% LAD, an 80% diagonal,      circumflex without significant disease, and a 30% to 40% stenosis in the      RCA. The LIMA was patent to the insertion of the  diagonal. His EF was      55% to 60%.  3.  Hyperlipidemia.  4.  Borderline diabetes.  5.  History of subdural hematoma secondary to trauma in January 2006.  6.  History of  peptic ulcer disease.  7.  History of TIA while on Plavix, Coumadin; anticoagulation discontinued      in January secondary to [facility].  8.  History of amaurosis fugax.  9.  BPH.  10. History of anxiety.  11. History of COPD/asthma.  12. Chronic diarrhea.   PAST SURGICAL HISTORY:  1.  Bypass surgery.  2.  Catheterization.  3.  Pelvic gastrectomy.  4.  Vagotomy in the 1980s.   SOCIAL HISTORY:  Lives in [place] with his wife and is retired for  [employer]. He quit smoking 30 years ago and does not abuse alcohol or drugs.   FAMILY HISTORY:  Noncontributory. Both parents are deceased and no siblings  have heart disease.   ALLERGIES:  He  has had increased bradycardia with beta blockers, and  arthralgias with statins.   MEDICATIONS:  1.  Zantac 150 mg b.i.d.  2.  Flomax 0.4 mg q.h.s.  3.  Aspirin 162 mg q.h.s.  4.  Lipitor 40 mg every other day at bedtime.  5.  Xanax 0.25 mg a.c. and h.s.  6.  Zoloft 50 mg q.h.s., started one week ago.  7.  Imodium two tablets q.h.s.   His p.r.n. medications include Advair 250/50, albuterol MDI, and Glucophage  500 mg.   REVIEW OF SYSTEMS:  Significant for chest pain as described above. He had no  recent shortness of breath, dyspnea on exertion, coughing, or wheezing. He  had some dizziness last week, but denies depression, although he may have  some problems with anxiety. He has arthralgias felt secondary to the  Lipitor. He has chronic diarrhea which is fairly well controlled with  Imodium. He has no other GI symptoms. Review of systems is otherwise  negative.   PHYSICAL EXAMINATION:  VITAL SIGNS: Temperature 98.1, blood pressure 104/62,  heart rate 58, respiratory rate 18, O2 saturation 97% on room air.  GENERAL: He is a well-developed, elderly white male in no  acute distress.  HEENT: His head is normocephalic and atraumatic. Pupils equal, round, and  reactive to light and accommodation. Extraocular movements intact. Sclerae  clear. Nares without discharge.  NECK: There is no lymphadenopathy, thyromegaly, bruits, or JVD noted.  CV: Regular rate and rhythm with an S1 and S2 and no significant murmurs,  rubs, or gallops noted. His distal pulses are 2+ in all four extremities.  LUNGS: Clear to auscultation bilaterally.  SKIN: No rashes or lesions are noted.  ABDOMEN: Soft and nontender with active bowel sounds and no  hepatosplenomegaly by exam.  EXTREMITIES: No clubbing, cyanosis, or edema.  MUSCULOSKELETAL: There is no joint deformity or effusions and no spine or  CVA tenderness.  NEUROLOGIC: Alert and oriented. Cranial nerves II-XII grossly intact.   EKG reveals rate of 55, sinus bradycardia, with no acute ischemic changes.   Laboratory values and chest x-ray are pending.   ASSESSMENT/PLAN:  1.  Chest pain. He will be admitted. If cardiac enzymes are negative, will      consider Cardiolite versus catheterization to evaluate for ischemia. Dr.      Riley Kill to see and will make the final decision. He will be      continued on aspirin, but no heparin secondary to his recent STH.  2.  Palpitations: He will be kept on telemetry overnight and Dr. Riley Kill      will decide in a.m. on an event monitor.  3.  He is otherwise stable and will be continued on his home medications.      RB/MEDQ  D:  02/09/2005  T:  02/09/2005  Job:  [id number]
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
## 2011-03-12 NOTE — H&P (Signed)
NAME:  John [name], John [name] NO.:  [id number]   MEDICAL RECORD NO.:  [id number]          PATIENT TYPE:  INP   LOCATION:  4743                         FACILITY:  [facility]   PHYSICIAN:  Hillery Aldo, M.D.   DATE OF BIRTH:  [birth year]   DATE OF ADMISSION:  05/28/2005  DATE OF DISCHARGE:                                HISTORY & PHYSICAL   PRIMARY CARE PHYSICIAN:  [name], M.D.   CHIEF COMPLAINT:  Generalized weakness.   HISTORY OF PRESENT ILLNESS:  The patient is a 76 year old male who was  discharged this morning from [facility] after having had a TURP on  May 24, 2005.  His hospital course was complicated by postoperative anemia,  and he had 2 units of packed red blood cells on May 26, 2005.  His  pretransfusion hemoglobin level was 7.1.  His hemoglobin level yesterday was  10.8.  He apparently presented to his primary care physician's office today  with generalized weakness, and a spot check of his hemoglobin was low.  He  was sent to the emergency room for further evaluation.  He also complained  of unremitting nausea since his surgical procedure.  He has had some  intermittent fever and chills up to 102 by his report.  His appetite is  diminished.  Denies any chest pain or arrhythmia.  No shortness of breath.  Will admit him for a 24-hour observation.   PAST MEDICAL HISTORY:  1.  Urosepsis May 20, 2005.  2.  History of urinary retention status post TURP on May 24, 2005.  3.  History of ureteral obstruction secondary to nephrolithiasis status post      double-J sent on the left, now removed.  4.  Coronary artery disease status post CABG in 1996.  5.  Asthma.  6.  Chronic obstructive pulmonary disease.  7.  History of transient ischemic attack.  8.  History of gastroesophageal reflux disease and hiatal hernia.  9.  Hyperlipidemia.  10. History of subdural hematoma secondary to trauma January 2006.  11. Diabetes.  12. Amaurosis fugax.  13.  History of supraventricular tachycardia and atrial flutter status post      EP study which was negative.  14. History of peptic ulcer disease status post gastrectomy and vagotomy.   FAMILY HISTORY:  Patient's father is deceased at age 60 secondary to  complications of a cerebral hemorrhage.  His mother's history is unknown.  He has one brother with coronary artery disease and diabetes.   SOCIAL HISTORY:  The patient is married and has two children.  He is a  retired Consulting civil engineer.  No tobacco since 1965, no alcohol.   ALLERGIES:  No known drug allergies, but he does have an intolerance to BETA  BLOCKERS causing bradycardia and STATINS causing muscle aches.   MEDICATIONS:  1.  Lipitor 40 mg daily.  2.  Xanax 1 mg q.h.s., 0.25 q.a.m.  3.  Aspirin 81 mg b.i.d.  4.  Lomotil 2.5 mg b.i.d.  5.  Zantac 150 mg daily.  6.  Zoloft 50 mg daily.  7.  Metformin 500 mg  b.i.d.  8.  Timolol 2.5 mg b.i.d.   REVIEW OF SYSTEMS:  As noted in HPI.  Notably, no changes in vision or  dysphagia.  Bowels are moving normally.  No reports of melena or  hematochezia.  No hematuria, dysuria, or hesitancy.  No musculoskeletal  complaints.  No headaches.  No focal weaknesses.   PHYSICAL EXAMINATION:  VITAL SIGNS:  Temperature 99, pulse 78, respirations  20, blood pressure 122/74, O2 saturation 99% on room air.  GENERAL:  Well-developed, well-nourished male in no apparent distress.  HEENT:  Normocephalic and atraumatic.  PERRL.  EOMI.  Oropharynx clear.  Tongue midline.  NECK:  Supple, no thyromegaly, no lymphadenopathy, no jugular venous  distention.  CHEST:  Lungs clear to auscultation bilaterally with good air movement.  HEART:  Regular rate and rhythm.  No murmurs, rubs, or gallops.  ABDOMEN:  Softly distended.  Decreased bowel sounds.  No rebound or  guarding.  RECTAL:  Normal sphincter tone.  Stool heme negative.  EXTREMITIES:  No clubbing, edema, cyanosis.  SKIN:  No rashes.  He does have some  keloid formation on his back at site of  prior cyst removal.  NEUROLOGIC:  Nonfocal.  The patient is alert and oriented.  He moves all  extremities with equal strength.  Cranial nerves II-XII grossly intact.   LABORATORY DATA:  White blood cell count 6.5, hemoglobin 10.8, hematocrit  30.8, platelets 398, MCV 92.1, absolute neutrophil count 4.6.  Chemistries  showed a sodium of 140, potassium 3.9, chloride 106, bicarb 26, BUN 14,  creatinine 1.4, glucose 105, total protein 5.8, albumin 2.7, AST 32, ALT 27,  alkaline phosphatase 71, total bilirubin 0.8.  Lipase 15.  Urinalysis  positive for nitrites, leukocyte esterase, and protein.  There were 3 to 6  white blood cells, 21 to 50 red blood cells, and a few bacteria noted.   ASSESSMENT AND PLAN:  1.  Generalized weakness with postoperative anemia status post transurethral      resection of the prostate:  The patient will be admitted for 24-hour      observation and his hemoglobin serially checked.  Given his underlying      history of coronary artery disease, I think it is reasonable to cycle      enzymes given the stress of his recent illness.  He may have a      smoldering urinary tract infection given his microscopy results on      urinalysis.  Will empirically put him on Cipro and check a culture.      Will also check blood cultures x 2.  2.  Persistent nausea, unclear etiology, may have some underlying      gastroparesis:  Will check an acute abdominal series and treat the      patient conservatively with antiemetics.  3.  Anemia:  The patient's hemoglobin appears to be stable from hemoglobin      check on May 27, 2005.  Will monitor serially for 24 hours to ensure      that he does not have any evidence of ongoing blood loss.  Does have      some active hematuria related to his recent transurethral resection of      the prostate.  4.  Diabetes:  Will hold his metformin and treat him with sliding scale     insulin before meals and  at bedtime.  5.  Hyperlipidemia: Will hold the patient's statins given his ongoing  nausea.  6.  Asthma/chronic obstructive pulmonary disease:  Stable at this point, but      will monitor O2 saturation and give him oxygen as indicated.       CR/MEDQ  D:  05/28/2005  T:  05/28/2005  Job:  [id number]   cc:   [name], M.D.  [address]  [place], [place] [address]  Fax: [phone]

## 2011-03-12 NOTE — Discharge Summary (Signed)
NAMEMarland Kitchen  John, [name] NO.:  [id number]   MEDICAL RECORD NO.:  [id number]          PATIENT TYPE:  INP   LOCATION:  2006                         FACILITY:  [facility]   PHYSICIAN:  John [name], M.D.     DATE OF BIRTH:  07-02-1935   DATE OF ADMISSION:  03/15/2005  DATE OF DISCHARGE:  03/20/2005                                 DISCHARGE SUMMARY   ALLERGIES:  BETA BLOCKERS caused bradycardia in the past and caused muscle  aches.   PRINCIPAL DIAGNOSIS:  Supraventricular tachycardia.   OTHER DIAGNOSES:  1.  Coronary artery disease status post CABG.  2.  Hyperlipidemia.  3.  Type 2 diabetes mellitus.  4.  Transient ischemic attack.  5.  Benign prostatic hypertrophy.  6.  Anxiety.  7.  Chronic obstructive pulmonary disease.  8.  Chronic diarrhea.  9.  Peptic ulcer disease.  10. History of bradycardia related to beta blockers.  11. Status post gastrectomy and vagotomy.   PROCEDURES:  Electrophysiology study which was negative for inducible SVT's.   HISTORY OF PRESENT ILLNESS:  A 76 year old white male with prior history of  coronary artery disease status post CABG in 1996 who recently had a negative  function study with LVEF of 61%.  Secondary to palpitations, he has  previously had an event recorder placed and this apparently demonstrated  atrial flutter with rapid rate.  On May 22, the patient had recurrent  palpitations and presented to the [facility] ED.  He was feeling weak and  had some chest discomfort.  He denied any presyncope or syncope.  Symptoms  have been occurring from 2-10 times per day.   HOSPITAL COURSE:  Following arrival to the ED, the patient was placed on a  monitor and it was felt to be atrial fibrillation with rapid response and he  was started on calcium channel blocker and then underwent ET evaluation,  with an ET study on May 24 which was negative for inducible SVT.  There was  no dual AV nodal physiology and no AP conduction.  It was  recommended that  the patient be initiated on Acebutolol with ET followup in 6-12 weeks.  The  patient continued to have episodes of tachycardia following by a sudden  bradycardia.  24-hour urine for catecholamines, VNA, and metanephrine were  sent off.  Results to all these pertaining to ruling out FIO chroma cytoma  are currently pending.  The patient was initiated on Pindolol 2.5 mg b.i.d.  and since his heart rate has been under good control, ranging from the 50's  to 70's.  He has been feeling well, without any recurring palpitations and  is being discharged home today in satisfactory condition.   DISCHARGE LABORATORY DATA:  Hemoglobin 14.3, hematocrit 41.0, WBC  5._________, platelets 187.  Sodium 142, potassium 4.6, chloride 111, CO2  27,, BUN 13, creatinine 1.0, glucose 94, total bilirubin 0.6, alkaline  phosphatase 63, AST 20, ALT 23, calcium 9.0.  Urine VMA, metanephrine,  catecholamines are all pending.  TSH 1.375.   DISCHARGE PHYSICAL EXAMINATION:  Blood pressure 102/59, heart rate 68,  respirations  20, temperature 97.4, pulse oximetry  97% on room.  Pleasant  male in no acute distress.  Awake  and oriented x3  NECK:  Normal carotid, no bruits or JVD.  LUNGS:  Respirations regular and unlabored.  CARDIAC:  Regular S1, S2, no S3, S4.  ABDOMEN:  Round, soft, nontender, nondistended.  Bowel sounds present x4.  EXTREMITIES:  Warm, dry, pink, no clubbing, cyanosis or edema.  Dorsalis  pedis, posterior tibial 2+ bilaterally.  The right groin site was used to EP  studies.  Clear of bleeding, bruising or hematoma.   DISPOSITION:  The patient being discharged home in good condition.   DISCHARGE MEDICATIONS:  Glucophage as previously prescribed, Advair and  albuterol as previously prescribed, Zoloft 50 mg daily, Xanax 0.25 mg a.c.  and h.s., Lipitor 40 mg daily, aspirin 81 mg daily, Flomax 0.4 mg daily,  Zantac 150 mg b.i.d., Pindolol 2.5 mg b.i.d., nitroglycerine 0.4 mg   sublingual p.r.n. chest pain.   PENDING LAB STUDIES:  Urine VMA, metanephrine, and catecholamines.   DURATION OF DISCHARGE:  40 minutes.      CRB/MEDQ  D:  03/20/2005  T:  03/20/2005  Job:  [id number]

## 2011-03-12 NOTE — Op Note (Signed)
NAME:  CLEVELAND, YARBRO NO.:  [id number]   MEDICAL RECORD NO.:  [id number]          PATIENT TYPE:  INP   LOCATION:  1409                         FACILITY:  [facility]   PHYSICIAN:  Jamison Neighbor, M.D.  DATE OF BIRTH:  [birth year]   DATE OF PROCEDURE:  05/24/2005  DATE OF DISCHARGE:                                 OPERATIVE REPORT   PREOPERATIVE DIAGNOSES:  1.  Benign prostatic hypertrophy.  2.  Acute urinary retention.  3.  Left ureteral stent.   POSTOPERATIVE DIAGNOSES:  1.  Benign prostatic hypertrophy.  2.  Acute urinary retention.  3.  Left ureteral stent.   PROCEDURE:  1.  Cystourethroscopy with stent removal.  2.  TURP.   SURGEON:  Jamison Neighbor, M.D.   ASSISTANT:  Glade Nurse, MD.   ANESTHESIA:  General endotracheal.   SPECIMENS:  1.  Prostatic chips.  2.  Ureteral stent.   DESCRIPTION OF PROCEDURE:  The patient was identified by his wrist bracelet  and brought to room 12 where he received preoperative antibiotics and was  prepped and draped in the usual sterile fashion. Next, a 22 French rigid  cystoscopic sheath with a 12 degree lens was inserted in the patient's  anterior urethra. His anterior and bulbar urethra was normal without mucosal  abnormalities. His prostatic urethra was noted to be quite long in length  with hypertrophy of his lateral lobes. His bladder neck was moderately open.  Upon entering the bladder, his bladder was mildly trabeculated. Left double-  J ureteral stent was noticed. The remainder of his urothelium was without  mucosal abnormality or foreign body. Using rasping forceps, the ureteral  stent was removed to the level of the urethral meatus without difficulty.  The stent was removed and passed off the field. Next, we dilated the  patient's urethra from 22 to 72 [place] sequentially with [name] male  urethral sounds. Next, a 28 French rigid resectoscope sheath was placed to  the level of the urinary bladder,  excellent clear urine output was obtained.  Next, the resectoscope sheath was inserted into the bladder using the  Timberlake obturator.  The patient underwent TURP. We created a channel at  the 6 o'clock position from the bladder neck to the verumontanum to  establish good outflow. Next, we resected from the 6 to 11 o'clock position  and from the 6 to the 1 o'clock position between the bladder neck and the  verumontanum working systematically. Once an adequate depth was reached  circumferentially and excellent hemostasis was maintained, the resectoscope  was removed, all chips were evacuated with a Toomey tip syringe.  Next, we removed the sheath and a three-way Foley catheter was placed, clear  urine output was obtained, Foley was placed to traction and normal saline  CVI was initiated. Please note Dr. Marcelyn Bruins was present and participated  in this entire case.       MT/MEDQ  D:  05/24/2005  T:  05/24/2005  Job:  [id number]

## 2011-03-12 NOTE — H&P (Signed)
NAME:  John [name], John [name] NO.:  [id number]   MEDICAL RECORD NO.:  [id number]          PATIENT TYPE:  INP   LOCATION:  1409                         FACILITY:  [facility]   PHYSICIAN:  Jamison Neighbor, M.D.  DATE OF BIRTH:  14-Dec-1934   DATE OF ADMISSION:  05/18/2005  DATE OF DISCHARGE:                                HISTORY & PHYSICAL   SERVICE:  Urology.   ADMISSION DIAGNOSES:  1.  Urosepsis.  2.  Urinary retention.  3.  Ureteral obstruction with double-J stent in place.  4.  History of calculi.  5.  Coronary artery disease status post coronary artery bypass graft.  6.  History of asthma.  7.  History of chronic obstructive pulmonary disease.  8.  History of transient ischemic attacks.  9.  History of hiatal hernia with reflux.   HISTORY:  This 76 year old male is very well known to me. The patient has a  long standing history of ureteral calculi. He has passed well over 160 of  these. The patient was evaluated a number of years ago and was found to have  some metabolic abnormalities and was placed on a combination of  hydrochlorothiazide and Urocit 10. When the patient took his medications he  actually did reasonably well but he stopped these and began to develop  stones. The patient had some obstruction on the left hand side. He underwent  a retrograde study which did not show a real active stone but he did end of  having a stent placed. The patient went into retention following that  procedure. He failed several voiding trials and it was planned that he would  eventually undergo a TURP. We decided to leave a Foley catheter in place and  allow him to take a trip but the patient became ill and had to cancel the  trip. He was given antibiotic therapy but did not get better and was  admitted to the hospital with probable sepsis.   The patient had temperatures that had gone as high as 102 plus but at the  time of admission, his temperature was 97.1. It was felt,  however, by the  oncall physician that he did require admission due to his sepsis.   PAST MEDICAL HISTORY:  Remarkable for coronary artery disease. He is status  post coronary artery bypass graft. He also is known to have a past history  of bradycardia when he took beta blockers. He has taken statins for elevated  cholesterol but had some complications or problems with these although right  now he is tolerating Lipitor without any difficulty. He had a recent  perfusion study in April of this year that was good. The patient was  hospitalized in May of this year with atrial fib and atrial flutter. The  patient is known to have COPD and asthma. He does have a past history of  TIA's. He did have a subdural hematoma secondary to trauma in January of  2006. The patient is known to have problems with diabetes. He has a hiatal  hernia and reflux with associated peptic ulcer disease and as noted  above,  he does have an indwelling Foley catheter because of problems with  retention. He also has a stent in place.   PAST SURGICAL HISTORY:  1.  Multiple cystoscopic procedures.  2.  He has had CABG in 1996.  3.  He had an open nephrolithotomy which actually lead to some problems with      his ureter and had to have subsequent surgery to fix that ureter.  4.  He has had lithotripsy x4.   SOCIAL HISTORY:  The patient does not use alcohol and has not smoked since  1965.   MEDICATIONS ON ADMISSION:  1.  Lipitor 40 mg at night.  2.  Pindolol 2.5 mg b.i.d.  3.  Zoloft 100 mg at night.  4.  Ranitidine 150 mg b.i.d.  5.  Xanax 1 mg at night.  6.  Lomotil. He takes 2 at night on a p.r.n. basis.  7.  Metformin 2 tablets at night.  8.  Aspirin which he has stopped preop.  9.  Pyridium.  10. Septra.  11. Uracid K.  12. Advair Discus on a p.r.n. basis.   PHYSICAL EXAMINATION:  GENERAL:  On examination today, the patient is an ill  appearing male. He is a little bit confused and is not his usual self  in  terms of his response to questions.  HEENT:  Normocephalic, atraumatic. Cranial nerves II-XII appear grossly  intact.  NECK:  Supple with no adenopathy or thyromegaly.  HEART:  Regular rate and rhythm, no murmurs, thrills, gallops, rubs or  heaves.  LUNGS:  Clear.  ABDOMEN:  Soft but there was tenderness above the suprapubic bone. He had  flank pain.  EXTREMITIES:  Bilateral extremities had no cyanosis, clubbing or edema.  NEUROLOGIC/VASCULAR:  Intact.  RECTAL:  Not performed but was done recently and he was noted to have a 3-4  plus benign feeling prostate.   IMPRESSION:  1.  Urosepsis.  2.  Benign prostatic hypertrophy with urinary retention.  3.  Recent placement of double-J catheter.  4.  Diabetes.  5.  Hypertension.  6.  Coronary artery disease.  7.  Hiatal hernia with reflux.  8.  History of stroke with transient ischemic attacks.   PLAN:  Admit for antibiotic therapy and eventual TURP.     _______________    RJE/MEDQ  D:  05/20/2005  T:  05/20/2005  Job:  [id number]   cc:   [name], M.D.  [address]  [place], [place] [address]  Fax: [phone]

## 2011-03-12 NOTE — Discharge Summary (Signed)
NAMEMarland Kitchen  John [name], John [name] NO.:  [id number]   MEDICAL RECORD NO.:  [id number]          PATIENT TYPE:  INP   LOCATION:  2012                         FACILITY:  [facility]   PHYSICIAN:  John Shirk, MD     DATE OF BIRTH:  1935/04/17   DATE OF ADMISSION:  01/29/2005  DATE OF DISCHARGE:                                 DISCHARGE SUMMARY   DISCHARGE DIAGNOSES:  1.  Chest pain.  2.  Lightheadedness.  3.  Benign prostatic hypertrophy.  4.  Anxiety.  5.  Diabetes mellitus/hyperglycemia.   DISCHARGE MEDICATIONS:  1.  Zantac 150 mg p.o. b.i.d.  2.  Flomax 0.4 mg p.o. daily.  3.  Aspirin 162 mg p.o. daily.  4.  Lipitor 40 mg p.o. daily.  5.  Xanax 0.25 mg one tablet before each meal 1 mg p.o. q.h.s.  6.  Lamictal p.r.n.   Please note that there have been no changes in patient's outpatient medicine  regimen.   FOLLOW-UP APPOINTMENTS:  With Dr. Oneta Rack in five to seven days.  Patient  will call for this appointment.  At this time patient will review all  medications with Dr. Oneta Rack.   HISTORY OF PRESENT ILLNESS:  John [name] is a very pleasant 76 year old  Caucasian gentleman with multiple medical problems.  Presented to the  emergency department on January 29, 2005 with complaints of lightheadedness  with standing about 36 hours prior to admission.  Initially this was  occurring only with standing but these symptoms never fully went away even  when he sat down or laid down.  He was seen by his primary care physician  the day prior to admission and given medications and fluids and these  symptoms resolved transiently.  However, he states that after returning home  these symptoms returned, but were not as bad.  In the morning he woke up to  have these symptoms present again; however, this time he had some chest  pressure with nausea, vomiting as well as pain in the neck.  He has no  complaints of diaphoresis, palpitations, radiation of that chest pressure to  any area of  the thorax.  Primary care physician advised him to be seen in  the ED and after initial evaluation was admitted to rule out ACS, since  patient's chest pain symptoms relieved after nitroglycerin and aspirin.  At  the time of examination during this admission patient had no chest pressure.  However, he was still feeling lightheaded with dizziness on sitting and  standing.   PAST MEDICAL HISTORY:  1.  Coronary artery disease status post CABG.  2.  Anxiety.  3.  BPH.  4.  GERD.  5.  Multiple TIAs.  6.  COPD.  7.  Chronic diarrhea.  8.  Diabetes mellitus/hyperglycemia with p.r.n. medications.   MEDICATIONS ON ADMISSION:  1.  Zantac 150 mg p.o. b.i.d.  2.  Flomax 0.4 mg p.o. daily.  3.  Aspirin 81 mg two tablets p.o. daily.  4.  Lipitor 40 mg p.o. q.h.s.  5.  Xanax 0.25 mg p.o. t.i.d. prior to meals and 1 mg  p.o. q.h.s.  6.  Lomotil two tablets q.h.s.   ALLERGIES:  No known drug allergies.   PHYSICAL EXAMINATION:  VITAL SIGNS:  Blood pressure 132/75, pulse 54,  respirations 18, temperature 97.5, O2 saturations 100% on room air.  GENERAL:  Well-developed, well-nourished Caucasian gentleman lying in bed in  no acute distress.  HEENT:  Normocephalic, atraumatic.  PERRL.  Sclerae anicteric.  Mucous  membranes moist.  NECK:  Supple.  No LAD.  No JVD.  LUNGS:  Clear to auscultation bilaterally.  No wheezes.  No rales.  ABDOMEN:  Soft.  Positive bowel sounds.  No distention.  No tenderness.  CARDIOVASCULAR:  S1 plus S2.  Regular rate and rhythm.  No murmurs, rubs, or  gallops.  EXTREMITIES:  No clubbing, cyanosis, edema.  NEUROLOGIC:  Cranial nerves II-XII grossly intact.  Muscle strength normal  and symmetrical all four extremities.  Reflexes symmetric and 2+ upper and  lower extremities.  Pulses 2+ bilaterally pedal and radial.  Patient had no  problems with his balance.  Romberg was negative.  Babinski's downgoing.   EKG shows normal sinus rhythm with no ST-T changes consistent  with ischemia.   LABORATORIES:  WBC 3.7, hemoglobin 13.1, platelets 153.  Point of care  cardiac enzymes x2 negative.  Sodium 141, potassium 4.1, bicarbonate 26,  chloride 108, glucose 93, BUN 10, creatinine 1.1.  CT head without contrast  negative for any acute abnormalities, diffuse cerebral atrophy with minimal  small vessel disease.  Chest x-ray showed no acute abnormalities, no  infiltrates, cardiac silhouette normal with no effusions.  MRI brain:  No  acute findings, mild chronic small vessel changes in the hemispheric white  matter.  No sign of old or recent intracranial hemorrhage.  MRA:  Normal  intracranial MR angiography of the large and medium sized vessels.  Carotid  Dopplers were negative.  No evidence of significant stenosis noted.   HOSPITAL COURSE:  Patient was admitted to a monitored bed.   #1 - CHEST PAIN:  Patient had two more sets of cardiac enzymes negative.  There were no events on the monitor, no arrhythmias or pauses.  Patient had  no chest pain or pressure at the time of discharge.  No further work-up for  the chest pain is planned at this time.   #2 [name]:  Patient had no orthostatic changes in the  ED or during his hospital stay.  MRI/MRA of the brain essentially were  negative.  No acute changes.  On day of discharge patient's dizziness had  resolved.  He was ambulatory.  Was able to walk to the bathroom in his room  without any difficulty.  There were no episodes of hypotension during his  hospital stay.  His systolic blood pressure stayed around 100s.  On day of  discharge his systolic blood pressure was 122/70.   #3 - GASTROESOPHAGEAL REFLUX DISEASE:  Patient was given Protonix.   #4 - ANXIETY:  Patient's Xanax was resumed at home dose.  There were no  events during the hospital stay.   #5 - BENIGN PROSTATIC HYPERTROPHY:  Patient's Flomax was resumed at home  dose.  #6 - HYPERGLYCEMIA/INSULIN-RESISTANT/DIABETES MELLITUS:   Patient states that  he episodically takes Metformin and Glipizide as needed.  He was placed on  Accu-Cheks with sliding scale.  Some of his blood sugars were in the 240s,  150s, others were in the 90s.  It was noted that patient was eating  nonhospital food during his stay.  Hence, it is difficult to assess glycemic  control.  Further management of his hyperglycemia is referred to Dr.  Oneta Rack.   Patient was discharged in stable condition.  His wife will come to pick him  up.  Discussion was held with Dr. Oneta Rack and Dr. Riley Kill.  Patient will be  followed by Dr. Oneta Rack and will be seen by Dr. Riley Kill as needed.  Patient  was advised to take all his medications as prescribed.  Please note that  there have been no changes in his outpatient regimen.  He was also advised  to return to the emergency department immediately upon onset of chest pain,  shortness of breath, or any other condition that might need immediate  medical attention.      GDK/MEDQ  D:  02/01/2005  T:  02/01/2005  Job:  [id number]   cc:   [name], M.D.  [address], [address]  [place], [place] [address]  Fax: [phone]   [name]. Riley Kill, M.D. [facility]   Clydene Fake, M.D.  [address]  [place]  [place] [address]  Fax: [phone]

## 2011-03-12 NOTE — Discharge Summary (Signed)
NAME:  John [name], John [name] NO.:  [id number]   MEDICAL RECORD NO.:  [id number]          PATIENT TYPE:  INP   LOCATION:  4741                         FACILITY:  [facility]   PHYSICIAN:  [name]. Riley Kill, M.D. [facility] OF BIRTH:  10/04/1935   DATE OF ADMISSION:  02/09/2005  DATE OF DISCHARGE:  02/11/2005                                 DISCHARGE SUMMARY   PROCEDURES:  Stress Cardiolite.   DISCHARGE DIAGNOSES:  1.  Chest pain, cardiac enzymes negative for myocardial infarction and      stress Cardiolite showed no ischemia with an inferior scar unchanged and      an ejection fraction of 61%.  2.  Status post aortocoronary bypass surgery in 1996 with the sequential      left internal mammary artery to left anterior descending and first      diagonal.  3.  Patent grafts by cardiac catheterization in 2002.  4.  Hyperlipidemia.  5.  Borderline diabetes.  6.  History of subdural hematoma secondary to trauma in January of 2006.  7.  History of peptic ulcer disease.  8.  History of transient ischemic attacks while on Plavix with Coumadin      discontinued secondary to subdural hematoma.  9.  History of amaurosis fugax  10. Benign prostatic hypertrophy.  11. Anxiety.  12. Chronic obstructive pulmonary disease.  13. Chronic diarrhea.  14. Status post pelvic gastrectomy and vagotomy.  15. History of bradycardia.   HOSPITAL COURSE:  John [name] is a 76 year old male with known coronary  artery disease.  He has been having episodes of tachy-palpitations and  hypertension that have been resolving spontaneously.  On the day of  admission, he had an episode that was associated with chest pain which was  relieved with sublingual nitroglycerin x2.  He was admitted for further  evaluation.   He had no significant arrhythmia in the hospital with PACs seen, but nothing  else.  His cardiac enzymes were negative for MI and a stress test was  negative for ischemia.  The hemoglobin A1c was  checked and was within normal  limits at 5.5.  His blood sugars ranged from 93-158.  A TSH was within  normal limits at 1.746.   John [name] is stable.  With a negative Cardiolite and no critical  arrhythmias on the monitor, he is tentatively considered stable for  discharge on February 13, 2005.  He is to pick up an event monitor in the  office and follow up with Dr. Riley Kill after that.   DISCHARGE INSTRUCTIONS:  1.  His activity level is to be as tolerated.  2.  He is to follow up with Dr. Riley Kill on Mar 19, 2005 at 3:15 and the      office will call with the time to pick up the event monitor.  He is to      follow up with Dr. Oneta Rack as needed or as scheduled.   DISCHARGE MEDICATIONS:  1.  Zantac 150 mg b.i.d.  2.  Flomax 0.4 mg nightly.  3.  Aspirin 81 mg two tablets nightly.  4.  Lipitor 40 mg every other day h.s.  5.  Xanax 0.25 mg q.a.c. and h.s.  6.  Zoloft 50 mg nightly.  7.  Imodium two tablets nightly.  8.  Advair 250/50, albuterol MDI and Glucophage as prior to admission.      RB/MEDQ  D:  02/11/2005  T:  02/12/2005  Job:  [id number]   cc:   [name], M.D.  [address]  [place], [place] [address]  Fax: [phone]

## 2011-03-12 NOTE — Discharge Summary (Signed)
NAMEMarland Kitchen  TRUMAN, ACEITUNO NO.:  [id number]   MEDICAL RECORD NO.:  [id number]          PATIENT TYPE:  INP   LOCATION:  4743                         FACILITY:  [facility]   PHYSICIAN:  Hillery Aldo, M.D.   DATE OF BIRTH:  06-13-1935   DATE OF ADMISSION:  05/28/2005  DATE OF DISCHARGE:  05/31/2005                                 DISCHARGE SUMMARY   DISCHARGE DIAGNOSES:  1. Deconditioning.  2. Nausea.  3. Anemia.  4. Hyperlipidemia.  5. Benign prostatic hypertrophy, status post transurethral resection of      the prostate.  6. Coronary artery disease.  7. Asthma with chronic obstructive pulmonary disease.  8. Gastroesophageal reflux disease.  9. History of peptic ulcer disease.  10.History of atrial flutter.   DISCHARGE MEDICATIONS:  1. Lipitor 40 mg daily.  2. Xanax 0.25 mg in the morning, 1 mg in the p.m.  3. Aspirin 81 mg b.i.d.  4. Zantac 150 mg daily.  5. Zoloft 50 mg daily.  6. Metformin 500 mg b.i.d.  7. Pindolol 2.5 mg b.i.d.  8. Lomotil p.r.n.  9. Dulcolax p.r.n.   CONSULTATIONS:  None.   BRIEF HISTORY OF PRESENT ILLNESS:  The patient is a 76 year old male who was  discharged from [facility] on May 24, 2005, after having a TURP  procedure done.  He had postoperative anemia and was transfused two units of  packed red blood cells on May 26, 2005.  He subsequently went to his  primary care physician's office and was sent here for concerns about ongoing  anemia and presyncope.  He was admitted for further evaluation and workup.   PROCEDURES AND DIAGNOSTIC STUDIES:  1. Head CT on May 29, 2005:  No acute intracranial abnormality is seen.      The ventricular system is stable, as is mild small-vessel ischemic      disease.  The septum is midline in position.  No mass effect is noted.      No bony abnormality is seen.  2. EKG on May 28, 2005:  Normal sinus rhythm at 68 beats per minute.  No      ST or T-wave abnormalities.  3. Gastric  emptying study on May 31, 2005:  Results are not available at      the time of this dictation but study was stopped early secondary to the      patient's passing all of the contrast material prior to the end of the      study, so my impression is that it will be normal.   HOSPITAL COURSE BY PROBLEM:  Problem 1.  GENERALIZED WEAKNESS:  The patient was initially admitted and  thought to be anemic.  Serial hemoglobin checks showed that his hemoglobin  remained stable at 10.8 and was 9.5 at the time of this dictation.  He did  not have any source of ongoing blood loss.  It was thought that his weakness  was secondary to after-effects of anesthesia and the stress of his recent  surgery.  A CT scan of his head was obtained given family's noticing that  the patient's left eye was slightly droopy.  No neurologic abnormalities  were elicited on exam.  At the time of this dictation, the patient feels  well and wishes to go home.   Problem 2.  NAUSEA:  Might be secondary to decreased bowel motility from  recent anesthesia.  He was treated with antiemetics and did well.  At the  time of this dictation, he is tolerating p.o. well.   Problem 3.  ANEMIA:  Again, no evidence of ongoing blood loss.  His anemia  is secondary to his recent surgery.  He should have a hemoglobin checked by  his primary care physician in one week's time to ensure that it remained  stable.   Problem 4.  DIABETES:  The patient's last hemoglobin A1c was 4.9, indicating  excellent control.  We managed his diabetes with sliding scale and Lantus  insulin given his ongoing problems with nausea.  He can resume his metformin  once he is discharged.   Problem 5.  HYPERLIPIDEMIA:  His statin was held during the course of his  hospitalization in case it may be contributing to ongoing problems of  nausea.  He can resume this when he is home.   DISCHARGE LABORATORY VALUES:  Cardiac enzymes were checked q.8h. x3, and all  three sets  were negative.  A urine culture did not reveal any source of  infection.  His liver function studies all were stable except for a slightly  low total protein at 5.8.  His electrolytes were stable.   DISPOSITION:  The patient is discharged home.  He was instructed to resume a  diabetic diet and to increase his activity slowly.  He is instructed to  follow up with Dr. Oneta Rack, his primary care physician, in one week's time.       CR/MEDQ  D:  05/31/2005  T:  06/01/2005  Job:  [id number]   cc:   [name], M.D.  [address]  [place], [place] [address]  Fax: [phone]

## 2011-03-12 NOTE — Discharge Summary (Signed)
[place]. [facility]  Patient:    John [name], John [name]                    MRN: [id number] Adm. Date:  [id number] Disc. Date: 03/16/00 Attending:  Rollene Rotunda Dictator:   Joellyn Rued, P.A.C. CC:         [name]. Riley Kill, M.D. [facility]             Marinus Maw, M.D.                  Referring Physician Discharge [place]  DATE OF BIRTH:  Mar 29, 1935  SUMMARY OF HISTORY:  [name] is a 76 year old white male who presents with chest discomfort which began at rest.  He described it as severe pressure, lasting a few minutes then resolving, and it occurred four times. He took some nitroglycerin with some relief.  He did have associated nausea and shortness of breath.  He felt that this discomfort was similar to prior to his bypass surgery except more severe.  His history is notable for two-vessel bypass grafting in 1996 with a LIMA to the diagonal and distal LAD. Cardiolite in March 1999 showed anterolateral ischemia and inferior scar.  EF was 52%.  His last catheterization in 1997 showed a 40-50% proximal RCA, 80-90% mid LAD, patent LIMA to the LAD, and a 40% lesion at the LIMA to the diagonal anastomosis.  He has a history of hyperlipidemia, peptic ulcer disease, hiatal hernia with repair in 1955 and vagotomy in 1980, borderline diabetes.  LABORATORY DATA:  Fasting lipids showed a cholesterol of 110, triglyceride 61, HDL 59, LDL 39, with a ratio of 1.9.  CKs and troponins were negative for myocardial infarction.  Sodium was 136, potassium 4.0, BUN 18, creatinine 1.1, glucose 179.  H&H 14.6 and 39.9, normal indices, platelets 204, wbcs 4.5.  Chest x-ray showed no active disease.  EKGs showed sinus bradycardia, normal sinus rhythm, nonspecific ST-T wave changes.  HOSPITAL COURSE:  [name] was admitted to 5100.  Enzymes and EKGs were negative for myocardial infarction.  He continued to have chest discomfort, thus on May 21 Dr. Riley Kill  performed cardiac catheterization.  According to the progress notes, he had an ejection fraction of 55%, proximal 40% RCA, 20% mid RCA, mild irregularities at the first OM-1 branch, a 70% totalled proximal LAD, 80% diagonal one.  The LIMA to the diagonal had a 40% lesion at the anastomosis, then the sequential LIMA portion to the LAD was patent. Dr. Riley Kill recommended medical treatment.  A VQ scan was performed.  However, this showed some increased atelectasis in the right base.  Thus, a spiral CT was performed to rule out pulmonary embolism.  This revealed a very large hiatal hernia, but no evidence of PE.  On May 23, Dr. Riley Kill felt that he could be discharged home.  It was noted that he was on a beta blocker in the hospital.  However, on a low dose at 12.5 b.i.d., he remained bradycardic in the 40s and 50s.  DISCHARGE DIAGNOSIS:  Atypical chest discomfort, history as previously described.  DISPOSITION:  He is discharged home.  MEDICATIONS: 1. Over-the-counter coated aspirin 325 q.d. 2. Lipitor 40 q.h.s. 3. Xanax 0.5 q.h.s. 4. Prozac 20 q.d. 5. Lomotil as previously. 6. Sublingual nitroglycerin as needed. 7. Dr. Riley Kill also asked him to take over-the-counter Zantac at night.  ACTIVITY:  He was advised no lifting, driving, sexual activity, or heavy exertion for  two days.  DIET:  Maintain low salt/fat/cholesterol diet.  WOUND CARE:  If he had any problems with his catheterization site, he was asked to call.  FOLLOW-UP:  He will see Dr. Riley Kill on June 7 at 3:45 p.m. DD:  03/16/00 TD:  03/16/00 Job: [id number]

## 2011-03-12 NOTE — Op Note (Signed)
[place]. [facility]  Patient:    John [name], John [name]                    MRN: [id number] Proc. Date: 12/16/99 Adm. Date:  [id number] Disc. Date: [id number] Attending:  Charlton Haws                           Operative Report  PREOPERATIVE DIAGNOSIS:  Enlarging sebaceous cyst, right posterior neck.  POSTOPERATIVE DIAGNOSIS:  Enlarging sebaceous cyst, right posterior neck.  PROCEDURE:  Removal.  SURGEON:  Currie Paris, M.D.  ANESTHESIA:  Local.  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  DESCRIPTION OF PROCEDURE:  The patient was brought to the minor procedure room. He was positioned on his left side and the area was prepped with Betadine and anesthetized with approximately 10 cc of 1% Xylocaine with epinephrine.  An elliptical incision was made to incorporate most of the overlying skin.  The cyst was excised with some little margin of fatty tissue around it, but staying fairly close to it and this was done sharply.  The incision appeared dry and the wound was closed with a running 4-0 Prolene.  The patient tolerated the procedure well. DD:  12/16/99 TD:  12/16/99 Job: [id number]

## 2011-03-12 NOTE — Discharge Summary (Signed)
[place]. [facility]  Patient:    John [name], John [name]                    MRN: [id number] Adm. Date:  [id number] Disc. Date: 01/06/01 Attending:  Ronaldo Miyamoto Dictator:   Tereso Newcomer, P.A. CC:         Marinus Maw, M.D.   Discharge Summary  DATE OF BIRTH:  Oct 02, 1935  DISCHARGE DIAGNOSES: 1. Unstable angina. 2. Coronary artery disease. 3. Status post coronary artery bypass grafting 1996, left internal mammary    artery to left anterior descending and diagonal 1. 4. History of peptic ulcer disease status post vagotomy. 5. History of hiatal hernia. 6. Hyperlipidemia. 7. Borderline diabetes mellitus. 8. History of bradycardia secondary to beta blockers.  PROCEDURES:  Cardiac catheterization by Dr. Shawnie Pons revealing a patent left internal mammary artery to left anterior descending, right coronary artery with proximal and mid stenosis of 30-40%, preserved global systolic function ejection fraction calculated at 50%.  HOSPITAL COURSE:  This 76 year old male presented to the emergency room on January 04, 2001 with angina like chest pain over the last week increased on the date of admission and relieved with nitroglycerin.  His initial EKG was without acute changes.  He was brought into the hospital and ruled out for MI by enzymes.  He was maintained on heparin, nitroglycerin, and aspirin.  He was set up for coronary angiography and this was performed on January 06, 2001.  The results of the catheterization are as above.  He tolerated the procedure well and had no immediate complications.  Post catheterization he was in stable condition without hematoma or bruits in the right groin.  Given the results of the catheterization, it was decided that he would undergo continued medical management.  He was also reassured.  LABORATORIES:  White blood cell count 4000, hemoglobin 11.6, hematocrit 33.4, platelet count 165,000.  INR 1.3.  Sodium  143, potassium 3.6, chloride 111, CO2 27, glucose 108, BUN 12, creatinine 1, calcium 8.9, total protein 5.9, albumin 3.4, AST 22, ALT 22, alkaline phosphatase 57, total bilirubin 0.9.  Chest x-ray:  No active disease.  DISCHARGE MEDICATIONS: 1. Lipitor 40 mg q.h.s. 2. Zantac 150 mg q.d. 3. Lamisil 250 mg q.d. 4. Lomotil p.r.n. diarrhea. 5. Coated aspirin 325 mg q.d. 6. Nitroglycerin as needed for chest pain.  ACTIVITY:  No driving, heavy lifting, or exertion for three days.  DIET:  Low fat, low sodium diabetic diet.  FOLLOW-UP:  He should call our office for any groin swelling, bleeding, or bruising.  He is to see Dr. Nilda Simmer. on March 28 at 11:15 a.m. DD:  01/06/01 TD:  01/06/01 Job: [id number]

## 2011-03-12 NOTE — H&P (Signed)
NAME:  John [name], John [name] NO.:  [id number]   MEDICAL RECORD NO.:  [id number]                   PATIENT TYPE:  EMS   LOCATION:  MAJO                                 FACILITY:  [facility]   PHYSICIAN:  Duke Salvia, M.D. [facility]           DATE OF BIRTH:  [birth year]   DATE OF ADMISSION:  11/15/2002  DATE OF DISCHARGE:                                HISTORY & PHYSICAL   HISTORY OF PRESENT ILLNESS:  The patient is a 76 year old gentleman with a  history of coronary artery disease, status post two vessel bypass in 1996  with a LIMA to his LAD/D1 with minimal plaquing in his right system, who  presents with recurrent chest pain.  He states the pain about three or four  days ago would come on at the end of the day and would last for six to eight  hours prior to him going to sleep. He would awaken without discomfort, to  have it recur again in the middle of the afternoon.  This was associated  with some modest increase in his blood pressure from the 120 range to the  150 range and heart rate from 60 range to about 80 range.  There was no  obvious precipitating factors or aggravating factors.  He did fine lying  flat, may have made the discomfort worse.  It was unassociated with a  brackish taste.  Activity had no impact on the discomfort.   This evening because of increasing severity of the discomfort, he came home  and his blood pressure was again elevated. He took Maalox; and this was  without relief.  He took nitroglycerin which markedly aggravated the  discomfort; this was followed by significant improvement.  He came to the  emergency room.  In the emergency room, he has received nitroglycerin and  morphine.  Electrocardiograms at different phases are not particularly  notable.   PAST MEDICAL HISTORY:  He has had similar discomfort on two prior occasions  in May of 2001 and March of 2002, both of which had prompted  catheterizations and on both of which  occasions he had no significant  progression of his disease.   Other related history is notable for history of ulcer disease, he is status  post pelvic gastrectomy at one point and then a vagotomy in the 80s, since  this time he has done really quite well.  He actually had scheduled  EGD/colonoscopy next week with Dr. Arlyce Dice.   Notable for hyperlipidemia with question borderline diabetes.   REVIEW OF SYSTEMS:  Notable for a lack of changing GI symptoms or lack of  exercise intolerance.  He is able to walk miles.  He has nocturnal dyspnea  or peripheral edema. He has had some rapid tachypalpitations which he felt  were regular; he has not had this since prior to his bypass surgery.  This  was associated with a warmth and some mild lightheadedness.  SOCIAL HISTORY:  He is married and retired.  He no longer smokes.   ALLERGIES:  No known drug allergies.   MEDICATIONS:  1. Zantac two a day.  2. Lipitor question dose.  3. Xanax.  4. Nitrostat.   ALLERGIES:  No known drug allergies.   PHYSICAL EXAMINATION:  GENERAL: He is a middle-aged Caucasian male in no  acute distress.  VITAL SIGNS:  Blood pressure 120/94, pulse 68.  HEENT:  No scleral icterus. The neck veins were flat. The carotids were  brisk and full bilaterally without bruits.  There is no lymphadenopathy in  the back of the neck.  HEART:  Sounds were regular and without S4 and without other murmurs.  ABDOMEN:  Soft with active bowel sounds and a midline scar. There is no  epigastric tenderness.  There is no midline pulsation.  Femoral pulses were  2+ on the right, distal pulses were intact with no cyanosis, clubbing, or  edema.  NEUROLOGY:  Grossly normal.  SKIN:  Warm and dry.   Electrocardiogram dated today at 0100 hours demonstrated sinus rhythm at 64  with intervals up to 0.15, 0.09, 0.39 with an axis of 60 degrees.   LABORATORY DATA:  Troponin of 0.01, normal CK and MB.  Hemoglobin was 13.8  with hematocrit of  39, white count 4.8, and platelets 199.  BMET was notable  for a normal blood sugar and a normal creatinine.   IMPRESSION:  1. Recurrent chest pain with primarily atypical features associated with a     modest increase in blood pressure and heart rate and similar to chest     pain on two prior occasions where he has had catheterization post bypass     without progression of disease.  2. Coronary artery disease, status post coronary artery bypass graft in 1996     with left internal mammary artery to his left anterior descending and     diagonal 1, and modest plaquing of his right coronary artery and normal     left ventricular function.  3. Peptic ulcer disease with prior gastrectomy and subsequent vagotomy.  4. Hyperlipidemia.  5. Tachypalpitations - regular.  6. Borderline diabetes in the past with normal blood sugar tonight.   The patient has chest pain that is reminiscent of prior admissions that was  unassociated with progression of heart disease. The pain is quite atypical  with its prolonged duration, its lack of relationship to exertion, its  aggravation by lying flat, and the fact that he has had it on two prior  occasions without significant progression of disease.  It is unlikely that  this is cardiac in origin.  He has a major issue with Cardiolite scanning as  his wife had a negative Cardiolite just shortly prior to three vessel  bypass.  The diagnostic test of choice may end up being another  catheterization and I will defer this to Dr. Riley Kill.   His history of tachypalpitations is intriguing, but as he has had discomfort  here in the hospital without tachy palpitations, it may be simply unrelated.  I wonder whether it is not related to his GI problems.  He is on Zantac and  it may be worth putting him on a PPI.   He should follow up with Dr. Arlyce Dice for endoscopy.  We will plan to check a D-dimer, serial enzymes, PPI, consider  catheterization per Dr. Riley Kill, give  one shot of Lovenox while things  clarify, and follow up with Dr. Arlyce Dice  for colonoscopy and endoscopy as  previously scheduled.                                               Duke Salvia, M.D. [facility]    SCK/MEDQ  D:  11/15/2002  T:  11/15/2002  Job:  [id number]   cc:   [name], M.D.  [address]  [place], [place] [address]  Fax: [phone]

## 2011-03-12 NOTE — Discharge Summary (Signed)
NAME:  John [name], ROBENSON NO.:  [id number]   MEDICAL RECORD NO.:  [id number]          PATIENT TYPE:  INP   LOCATION:  1409                         [facility]   PHYSICIAN:  Jamison Neighbor, M.D.  DATE OF BIRTH:  1935/10/10   DATE OF ADMISSION:  05/18/2005  DATE OF DISCHARGE:  05/28/2005                                 DISCHARGE SUMMARY   DISCHARGE DIAGNOSES:  1.  Pyelonephritis.  2.  Chronic obstructive pulmonary disease.  3.  Bladder neck obstruction.  4.  Benign prostatic hypertrophy.  5.  Coronary artery disease.  6.  Past history of coronary bypass.  7.  Diabetes mellitus.  8.  History of peptic ulcer disease.  9.  Hernia.  10. Esophageal reflux.  11. History of urinary calculi.   PRINCIPAL PROCEDURE:  During this hospital stay was cystoscopy and TURP plus  removal of a ureteral stent.  The patient also received red blood cells  transfusion.   HISTORY:  This 76 year old male is well known to me.  The patient has a  longstanding history of ureteral calculi, had passed well over 160.  He has  been treated with hydrochlorothiazide and UrocitK which worked quite nicely.  The patient actually did stop taking his medications and he began to develop  stones and ended up having a stent placed in order to decompress his left  kidney.  The patient went into retention following that procedure and has  failed several voiding trials.  He was told that eventually he would require  a TURP.  We brought the Foley catheter in place to get him ready for the  procedure but he became ill from sepsis and is now being admitted for  additional evaluation.  The patient said his temperature had gotten as high  as 102 but he was afebrile at the time of admission.  The patient was  admitted by our on-call physician because of presumed sepsis.   PAST MEDICAL AND SURGICAL HISTORY:  As well as his initial history and  physical are well delineated on the dictated history and  physical.   The patient was placed on antibiotic therapy and had a very slow course.  His temperature got as high as 102.6 in the early hospitalization.  His  laboratory studies did improve on antibiotic therapy.  We were able to get  his bowels moving and gave him a more regular diet and it was felt that he  would need to remain on Ancef and gentamicin until he was ready for a TURP.  The patient was taken to the operating room on May 24, 2005 where he  underwent a TURP.  He also had a stent removed.  The patient felt fairly  well.  He described some bladder pain and penile pain but seemed to be  recovering in a normal fashion.  It was felt that he was ready for discharge  on May 26, 2005.  The patient said that he just felt bad and it was felt  that he was too weak to go home.  He had no appetite, denied any pain.  We  held his discharge.  We also cut him back on Xanax which we thought might be  a contributing factor.  The patient was monitored for another day.  He felt  a little bit better but still was slightly nauseated.  Laboratory studies  all appeared to be normal and he was felt to be ready for discharge.  His  creatinine had changed slightly, going up to 1.6 but his BUN was still  normal.  It was felt this may have been due to the course of gentamicin.  The patient was sent home on antibiotic therapy on May 28, 2005 and will  return in follow up to the office.           ______________________________  Jamison Neighbor, M.D.  Electronically Signed     RJE/MEDQ  D:  07/14/2005  T:  07/15/2005  Job:  [id number]
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
## 2011-03-12 NOTE — Op Note (Signed)
NAME:  GRAYSIN, LUCZYNSKI                       ACCOUNT NO.:  [id number]   MEDICAL RECORD NO.:  [id number]                   PATIENT TYPE:  AMB   LOCATION:  DAY                                  FACILITY:  [facility]   PHYSICIAN:  Sigmund I. Patsi Sears, M.D.         DATE OF BIRTH:  Aug 11, 1935   DATE OF PROCEDURE:  DATE OF DISCHARGE:                                 OPERATIVE REPORT   PREOPERATIVE DIAGNOSIS:  Left distal ureteral calculus.   POSTOPERATIVE DIAGNOSIS:  Left distal ureteral calculus.   PROCEDURE:  1. Cystoscopy.  2. Left retrograde pyelogram.  3. Left ureteroscopic stone extraction.   SURGEON:  Sigmund I. Patsi Sears, M.D.   ASSISTANT:  Terri Piedra, N.P.  Susanne Borders, MD   ANESTHESIA:  General endotracheal.   SPECIMENS:  Ureteral stone for analysis.   COMPLICATIONS:  None.   INDICATIONS FOR PROCEDURE:  Mr. Tonne is a gentleman with a history of  left flank pain.  He was seen by Dr. Patsi Sears and a CT stone study was  obtained.  He was found to have a left distal ureteral calculus.  He has  been unable to pass this stone and would like definitive treatment.  He has  decided on ureteroscopic stone extraction, with the possible laser  lithotripsy, after understanding the risks, benefits and alternatives.   DESCRIPTION OF PROCEDURE:  The patient is brought to the operating room and  quickly identified by his identification bracelet.  He was given  preoperative antibiotics and general endotracheal anesthesia.  He was placed  in the dorsal lithotomy position and his genitalia were prepped and draped  in the normal sterile fashion.  A 22-French cystoscope sheath with a 12-  degree lens was used to enter the patient's urethra.  The patient had a  normal anterior urethra, but his posterior urethra demonstrated significant  trilobar hypertrophy, with a very prominent median lobe.  Inside the  bladder, there were no mucosal abnormalities appreciated.  There was 2+  bladder trabeculation.  Bilateral ureteral orifices were seen in normal  anatomic location.  There were no stones, foreign bodies or tumors within  the bladder after careful cystoscopic examination.  A 0.38 mm guide wire was  passed through the left ureteral orifice, up to the left renal pelvis (as  visualized directly by fluoroscopy).  The bladder was drained and the  cystoscope was removed.  The 6-French short semi-rigid ureteroscope was then  carefully passed through the urethra and into the bladder.  At that point,  two small stones were seen on the floor of the bladder, just below the  ureteral orifice.  The stones must have passed after placement of the guide  wire.  We passed the ureteroscope up midway into the left ureter, and no  further calculi were seen.  A left retrograde pyelogram was performed  through the ureteroscope, and no filling defects indicative of further  stones were noted.  The ureteroscope was carefully  backed out of the ureter,  again no calculi were seen.  The two small stones in the bladder were  grasped with a [place] basket and removed from the bladder.  The  patient's bladder was then drained and the patient was awakened and taken to  the postanesthesia care unit in stable condition.   Please note that Dr. Patsi Sears was present and participated in all aspects  of the case.  There were no complications.     Susanne Borders, MD                           Sigmund I. Patsi Sears, M.D.    DR/MEDQ  D:  07/30/2003  T:  07/30/2003  Job:  [id number]

## 2011-03-12 NOTE — Op Note (Signed)
NAMEMarland Kitchen  John [name], John [name] NO.:  [id number]   MEDICAL RECORD NO.:  [id number]          PATIENT TYPE:  INP   LOCATION:  2006                         FACILITY:  [facility]   PHYSICIAN:  Doylene Canning. Ladona Ridgel, M.D.  DATE OF BIRTH:  04/09/1935   DATE OF PROCEDURE:  03/17/2005  DATE OF DISCHARGE:                                 OPERATIVE REPORT   PROCEDURE PERFORMED:  Electrophysiologic study.   SURGEON:  Doylene Canning. Ladona Ridgel, M.D.   INDICATIONS:  Recurrent symptomatic SVT in a patient with a history of sinus  bradycardia such that the patient is unable to take medical therapy for  prevention of his SVT.   INTRODUCTION:  The patient is a 76 year old man with a history of coronary  disease status post bypass surgery, history of diabetes, hyperlipidemia, and  a history of TIAs in the past. The patient was admitted to the hospital with  tachypalpitations and was found to have a narrow QRS tachycardia at the rate  of 130-140 beats per minute. The etiology of the patient's arrhythmia was  uncertain. These would start and stop suddenly and were associated with  palpitations. These appeared to be secondary to a long RP tachycardia  although atrial flutter could not be excluded. The patient is now referred  for left physiologic study and catheter ablation.   PROCEDURE:  After informed consent was obtained, the patient was  taken to  the diagnostic EP Laboratory in the fasting state. After the usual  preparation and draping, intravenous fentanyl and midazolam was given for  sedation. A 6-French hexapolar catheter was inserted percutaneously into the  right jugular vein and advanced to the coronary sinus. A 5-French  quadripolar catheter was inserted percutaneously into the right femoral vein  and advanced to the RV apex. A 5-French quadripolar catheter was inserted  percutaneously into the right femoral vein and advanced to the His bundle  region. After measurement of the basic intervals,  rapid ventricular pacing  was carried out from the RV apex demonstrating VA dissociation at 16 msec.  Programmed ventricular stimulation was not carried out secondary to  underlying VA dissociation at 16 msec.  Next, programmed atrial stimulation  was carried out from the coronary sinus at a paced cycle length of 600 msec.  The S1-S2 interval was stepwise decreased down to 320 msec where the AV node  ERP was observed. During programmed atrial stimulation, there were no AH  jumps and no echo beats noted. Next, rapid atrial pacing was carried out  from the coronary sinus at a paced cycle lengths of 600 msec and stepwise  decreased down to 200 msec.  During rapid atrial pacing, there was no  inducible SVT. There was no evidence of any accessory pathway conduction nor  was there any evidence of dual AV nodal physiology. It should be noted that  multiple pacing rims were carried out both from he coronary sinus as well as  the hyoid atrium at base adjusted cycle length of 700, 600, and 500 msec.  Despite an aggressive pacing protocol of rapid atrial pacing and programmed  AH stimulation, there was  no inducible SVT. It should be noted that because  of the patient's underlying coronary disease, he was not given Isoproterenol  to attempt to induce his arrhythmia. At this point, the catheters were  removed. Hemostasis was assured. The patient was returned to his room in  satisfactory condition.   COMPLICATIONS:  There was no immediate procedural complications.   RESULTS:  A.  Baseline ECG:  The baseline ECG demonstrates normal sinus  rhythm with normal axis and intervals.  B.  Baseline intervals.  The sinus nodal cycle length was 1168 msec.  QRS  duration was 77 msec  HV interval was 47 msec with a QT interval of 418  msec.  C.  Rapid ventricular pacing. Rapid ventricular pacing was set up from the  RV apex at paced cycle length of 16 msec and stepwise decreased, amd this  demonstrated VA  block.  D.  Programmed ventricular stimulation.  Programmed ventricular stimulation  was carried out from the RV apex and there was VA dissociation.  E.  Rapid atrial pacing. Rapid atrial pacing was carried out from the  coronary sinus as well as hyoid atrium.  Pacing cycle length of 600 msec and  stepwise decreased down to 100 msec.  During rapid atrial pacing, the AV  Wenckebach cycle length was 440 msec  During rapid atrial pacing, the PR  interval was less than the RA interval, and there was no inducible SVT. F.  Programmed atrial stimulation.  Programmed atrial stimulation was carried  out from the coronary sinus as well as the hyoid atrium with a base adjusted  cycle length of 600 and 500 msec.  The S1 and S2 interval was stepwise  decreased down to atrial refractoriness. During programmed atrial  stimulation, there were no AH jumps, no echo beats, and no inducible SVT.   CONCLUSION:  This study demonstrates no evidence of inducible SVT in a  patient with a history of nonsustained SVT. The patient's mechanism of  tachycardia is unclear. There was no evidence of dual AV nodal physiology  nor was there any evidence of accessory pathway conduction. Plan at this  time will be to proceed with a period of watchful waiting. Consideration for  AV nodal blocking drugs will be considered though unfortunately,  this might  require the placement of a permanent pacemaker.      GWT/MEDQ  D:  03/17/2005  T:  03/17/2005  Job:  [id number]   cc:   [name], M.D.  [address]  [place], [place] [address]  Fax: [phone]   [name]. Riley Kill, M.D. [facility]

## 2011-03-12 NOTE — Discharge Summary (Signed)
NAME:  John [name], AGE                       ACCOUNT NO.:  [id number]   MEDICAL RECORD NO.:  [id number]                   PATIENT TYPE:  INP   LOCATION:  3736                                 FACILITY:  [facility]   PHYSICIAN:  [name]. Riley Kill, M.D. [facility]         DATE OF BIRTH:  [birth year]   DATE OF ADMISSION:  DATE OF DISCHARGE:                           DISCHARGE SUMMARY - REFERRING   PROCEDURE:  Cardiolite.   HOSPITAL COURSE:  Mr. Champlain is a 76 year old male with a history of  coronary artery disease.  He had bypass surgery in 1996 with a limited LAD  and diagonal.  He began having chest pain about three or four days before  admission and it would last for six to eight hours.  It was associated with  a moderate increase in his blood pressure.  Activity was not associated.  He  was admitted to the hospital for further evaluation and treatment.  His  enzymes were negative for myocardial infarction and he was scheduled for a  Cardiolite which was performed on 11/16/2002.  It was an exercise treadmill  and he had a target heart rate of 131 but exercised to a maximum heart rate  of 153.  He had no chest pain.  He had some slight ST flattening that  resolved quickly in recovery in the inferolateral leads.  The images showed  some diaphragmatic attenuation but no ischemia and an ejection fraction of  55%.  He was evaluated and felt stable for discharge with no further  episodes of pain and no ischemia on his Cardiolite on 11/16/2002.   Chest X-ray revealed no evidence of acute cardiopulmonary disease.   LABORATORY VALUES:  Hemoglobin 13.6, hematocrit 38.1, WBC 4.7, platelets  178.  INR was 1.2 with a D-dimer of 0.28.  Sodium 142, potassium 3.8,  chloride 110, CO2 not performed, BUN 13, creatinine 0.9, glucose 77,  hemoglobin A1c 5.0 with serial CK MB and troponin I negative for myocardial  infarction.   DISCHARGE CONDITION:  Stable.   DISCHARGE DIAGNOSES:  1. Chest pain, possibly  secondary to gastroesophageal reflux disease     symptoms with proton pump inhibitor added this admission.  2. Status post aortic coronary bypass surgery in 1996 with LIMA to LAD and     diagonal.  3. Preserved left ventricle with an ejection fraction of 55% by Cardiolite     this admission.  4. Status post cardiac catheterization in May 2001 and March 2002 after     episodes of chest pain that showed no progression of disease.  5. Status post pelvic gastrectomy.  6. Status post vagotomy.  7. History of peptic ulcer disease.  8. Hyperlipidemia.  9. Remote history of tobacco use.   DISCHARGE INSTRUCTIONS:  His activity level is to be as tolerated.  He  should stick to a low fat diet.  He is to call the office for an appointment  with Dr. Riley Kill and keep  his appointment with Dr. Oscar La as scheduled.   DISCHARGE MEDICATIONS:  1. Lipitor as prior to admission.  2. Xanax as prior to admission.  3.     Nitroglycerin as prior to admission.  4. Aspirin 81 milligrams q. day.  5. Protonix 40 milligrams q. day in place of Zantac.     Lavella Hammock, P.A. [facility]                  Thomas D. Riley Kill, M.D. [facility]    RG/MEDQ  D:  11/16/2002  T:  11/17/2002  Job:  [id number]   cc:   John [name]  [address] [place]  [place] [address]  Fax: [phone]   Jeoffrey Massed, M.D.  [facility]  [place], [place] [address]  Fax: [phone]
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
## 2011-03-12 NOTE — Assessment & Plan Note (Signed)
[facility]                              CARDIOLOGY OFFICE NOTE   GREGORI, ABRIL                    MRN:          [id number]  DATE:05/17/2006                            DOB:          December 26, 1934    Mr. Pollman is in for followup.  He has seen Dr. Sampson Goon.  Dr. Oneta Rack  has been doing all of his lab work.  The patient has generally gotten along  well.  He has had no more syncope or presyncope and has continued to  tolerate the fludrocortisone without difficulty.   PHYSICAL EXAMINATION TODAY:  VITAL SIGNS:  Blood pressure 128/65, pulse is  86.  LUNG FIELDS:  Clear.  CARDIAC:  Rhythm is regular.  No significant murmurs are appreciated.   The electrocardiogram demonstrates normal sinus rhythm, nonspecific T wave  abnormality, and left ventricular hypertrophy with repolarization  abnormality.  There is a rare premature ventricular contraction.   The patient continues to get along well.  He denies any chest pain.  He  denies any syncope or presyncope.  The patient has tolerated fludrocortisone  without difficulty, and his symptoms have virtually resolved.  We plan to  see him back in followup in 6 months.  He will need a basic metabolic  profile to ensure that his potassium remains satisfactory.                              [name]. Riley Kill, MD, [facility]    TDS/MedQ  DD:  05/17/2006  DT:  05/17/2006  Job #:  [id number]   cc:   [name], MD

## 2011-03-13 ENCOUNTER — Emergency Department (HOSPITAL_COMMUNITY): Payer: Medicare Other

## 2011-03-13 ENCOUNTER — Inpatient Hospital Stay (HOSPITAL_COMMUNITY)
Admission: EM | Admit: 2011-03-13 | Discharge: 2011-03-15 | DRG: 493 | Disposition: A | Discharge status: Home Health | Payer: Medicare Other | Attending: Orthopedic Surgery | Admitting: Orthopedic Surgery

## 2011-03-13 DIAGNOSIS — E119 Type 2 diabetes mellitus without complications: Secondary | ICD-10-CM | POA: Diagnosis present

## 2011-03-13 DIAGNOSIS — Z951 Presence of aortocoronary bypass graft: Secondary | ICD-10-CM

## 2011-03-13 DIAGNOSIS — K449 Diaphragmatic hernia without obstruction or gangrene: Secondary | ICD-10-CM | POA: Diagnosis present

## 2011-03-13 DIAGNOSIS — F17200 Nicotine dependence, unspecified, uncomplicated: Secondary | ICD-10-CM | POA: Diagnosis present

## 2011-03-13 DIAGNOSIS — E785 Hyperlipidemia, unspecified: Secondary | ICD-10-CM | POA: Diagnosis present

## 2011-03-13 DIAGNOSIS — I498 Other specified cardiac arrhythmias: Secondary | ICD-10-CM | POA: Diagnosis present

## 2011-03-13 DIAGNOSIS — E7800 Pure hypercholesterolemia, unspecified: Secondary | ICD-10-CM | POA: Diagnosis present

## 2011-03-13 DIAGNOSIS — I10 Essential (primary) hypertension: Secondary | ICD-10-CM | POA: Diagnosis present

## 2011-03-13 DIAGNOSIS — S92309A Fracture of unspecified metatarsal bone(s), unspecified foot, initial encounter for closed fracture: Secondary | ICD-10-CM | POA: Diagnosis present

## 2011-03-13 DIAGNOSIS — Y93H2 Activity, gardening and landscaping: Secondary | ICD-10-CM

## 2011-03-13 DIAGNOSIS — J4489 Other specified chronic obstructive pulmonary disease: Secondary | ICD-10-CM | POA: Diagnosis present

## 2011-03-13 DIAGNOSIS — W010XXA Fall on same level from slipping, tripping and stumbling without subsequent striking against object, initial encounter: Secondary | ICD-10-CM | POA: Diagnosis present

## 2011-03-13 DIAGNOSIS — E7150 Peroxisomal disorder, unspecified: Secondary | ICD-10-CM | POA: Diagnosis present

## 2011-03-13 DIAGNOSIS — S82853A Displaced trimalleolar fracture of unspecified lower leg, initial encounter for closed fracture: Principal | ICD-10-CM | POA: Diagnosis present

## 2011-03-13 DIAGNOSIS — I2510 Atherosclerotic heart disease of native coronary artery without angina pectoris: Secondary | ICD-10-CM | POA: Diagnosis present

## 2011-03-13 DIAGNOSIS — J449 Chronic obstructive pulmonary disease, unspecified: Secondary | ICD-10-CM | POA: Diagnosis present

## 2011-03-13 DIAGNOSIS — Z8673 Personal history of transient ischemic attack (TIA), and cerebral infarction without residual deficits: Secondary | ICD-10-CM

## 2011-03-13 DIAGNOSIS — Y92009 Unspecified place in unspecified non-institutional (private) residence as the place of occurrence of the external cause: Secondary | ICD-10-CM

## 2011-03-13 DIAGNOSIS — K219 Gastro-esophageal reflux disease without esophagitis: Secondary | ICD-10-CM | POA: Diagnosis present

## 2011-03-13 LAB — CBC
MCH: 31.9 pg (ref 26.0–34.0)
Platelets: 179 10*3/uL (ref 150–400)
RBC: 4.29 MIL/uL (ref 4.22–5.81)
WBC: 8.5 10*3/uL (ref 4.0–10.5)

## 2011-03-13 LAB — DIFFERENTIAL
Basophils Absolute: 0 10*3/uL (ref 0.0–0.1)
Basophils Relative: 0 % (ref 0–1)
Eosinophils Absolute: 0.1 10*3/uL (ref 0.0–0.7)
Neutrophils Relative %: 71 % (ref 43–77)

## 2011-03-13 LAB — BASIC METABOLIC PANEL
Chloride: 106 mEq/L (ref 96–112)
Creatinine, Ser: 1.09 mg/dL (ref 0.4–1.5)
GFR calc Af Amer: >60 mL/min (ref >60)
Potassium: 4.4 mEq/L (ref 3.5–5.1)

## 2011-03-13 LAB — PROTIME-INR
INR: 1.12 (ref 0.00–1.49)
Prothrombin Time: 14.6 seconds (ref 11.6–15.2)

## 2011-03-14 LAB — BASIC METABOLIC PANEL
BUN: 11 mg/dL (ref 6–23)
Calcium: 8.4 mg/dL (ref 8.4–10.5)
Creatinine, Ser: 1.03 mg/dL (ref 0.4–1.5)
GFR calc non Af Amer: >60 mL/min (ref >60)
Glucose, Bld: 103 mg/dL — ABNORMAL HIGH (ref 70–99)
Sodium: 141 mEq/L (ref 135–145)

## 2011-03-14 LAB — GLUCOSE, CAPILLARY
Glucose-Capillary: 93 mg/dL (ref 70–99)
Glucose-Capillary: 103 mg/dL — ABNORMAL HIGH (ref 70–99)
Glucose-Capillary: 135 mg/dL — ABNORMAL HIGH (ref 70–99)

## 2011-03-14 LAB — CBC
HCT: 35.1 % — ABNORMAL LOW (ref 39.0–52.0)
MCHC: 33.6 g/dL (ref 30.0–36.0)
RDW: 13.2 % (ref 11.5–15.5)

## 2011-03-15 LAB — GLUCOSE, CAPILLARY
Glucose-Capillary: 105 mg/dL — ABNORMAL HIGH (ref 70–99)
Glucose-Capillary: 112 mg/dL — ABNORMAL HIGH (ref 70–99)

## 2011-03-16 NOTE — Op Note (Signed)
NAME:  John [name], John [name] NO.:  [id number]  MEDICAL RECORD NO.:  [id number]           PATIENT TYPE:  I  LOCATION:  5023                         FACILITY:  [facility]  PHYSICIAN:  Harvie Junior, M.D.   DATE OF BIRTH:  [birth year]  DATE OF PROCEDURE:  03/13/2011 DATE OF DISCHARGE:                              OPERATIVE REPORT   PREOPERATIVE DIAGNOSES:  Trimalleolar ankle fracture and fractures of the second, third, and fourth metatarsals proximally.  POSTOPERATIVE DIAGNOSES:  Trimalleolar ankle fracture and fractures of the second, third, and fourth metatarsals proximally.  PRINCIPAL PROCEDURE: 1. Open reduction and internal fixation of trimalleolar ankle fracture     with medial fixation with 2 K-wires and multiple tension bands of     #2 FiberWire. 2. Fixation of the lateral malleolus with a 6-hole one-third tubular     plate with an interfragmentary screw and supplemental fixation of a     comminuted anterior fragment with a FiberWire band. 3. Closed treatment of second, third, and fourth metatarsals.  SURGEON:  Harvie Junior, MD  ASSISTANT:  Marshia Ly, PA  ANESTHESIA:  General.  BRIEF HISTORY:  John [name] is a 76 year old male who was at home who fell and fractured his ankle and foot.  He tried to get up but could not.  He came to the emergency room where x-rays which showed that he had a trimalleolar ankle fracture and fracture of second, third, and fourth metatarsal.  We were consulted.  He had a displaced ankle fracture, so at that point, we felt that he needed open reduction and internal fixation.  We talked to him and his wife about this and he was brought to the operating room for this procedure.  PROCEDURE:  The patient was brought to the operating room.  After adequate anesthesia was obtained with general anesthetic, the patient was placed supine on the operating table.  The left ankle was then prepped and draped in the usual sterile fashion.   Following this, the leg was exsanguinated.  Blood pressure tourniquet inflated to 350 mmHg. Following this, a lateral incision was made.  Subcutaneous tissue taken down to the level of the lateral malleolus which was anatomically reduced with a lockjaw clamp.  Attention turned medially where the medial malleolus was then identified.  Unfortunately, there was a longitudinal split tear of the medial malleolus.  We got 2 FiberWires passed through these longitudinal pieces which allowed [place] to kind of lag that back into place.  Then, we used longitudinal K-wires, as really was so much comminution, we could not use of screws.  Once these K-wires were passed, they were bent, turned, twisted, and then knocked into place and we used a tension band technique to give better fixation. Once this was completed, attention was then turned to lateral side. Again in that wound under the anatomic reduction we had with a clamp, we put interfragmentary screw, then used a 7-hole plate with 3 screw proximally, 2 screws distally, and the middle 2 left open.  There was also an anteriorly comminuted piece that was probably anterior tibia- fibula ligament and this was displaced and  we used a FiberWire to reattach this essentially this anterior ligamentous complex to the ankle.  At this point, the fluoro images were taken.  Anatomic reduction had been achieved.  The posterior malleolus had reduced nicely and we felt that an adequate fixation had been achieved.  The wounds were then closed in layers and then stapled.  Attention was turned back to the foot where the second, third, and fourth metatarsals were treated with conservatively.  The patient was placed in a U and posterior splint, care being taken to on the posterior splint to come out keep the great toe in extension to help hold the posterior malleolus piece in place. Because of significant comminution, the patient is going to be 6 weeks of nonweightbearing,  he understands that.  The patient will be admitted to the hospital for postoperative monitoring, make sure that he can use crutches and once he is cleared on that, he will be able to be discharged home.     Harvie Junior, M.D.     Ranae Plumber  D:  03/14/2011  T:  03/14/2011  Job:  [id number]  Electronically Signed by Jodi Geralds M.D. on 03/16/2011 05:44:13 PM

## 2011-03-22 NOTE — Consult Note (Signed)
NAME:  John [name], John [name] NO.:  [id number]  MEDICAL RECORD NO.:  [id number]           PATIENT TYPE:  E  LOCATION:  [facility]                         FACILITY:  [facility]  PHYSICIAN:  Brendia Sacks, MD    DATE OF BIRTH:  [birth year]  DATE OF CONSULTATION: DATE OF DISCHARGE:                                CONSULTATION   REQUESTING PHYSICIAN:  Harvie Junior, MD, Orthopedics.  REASON FOR CONSULTATION:  Medical clearance.  CHIEF COMPLAINT:  Ankle pain.  HISTORY OF PRESENT ILLNESS:  This is a 76 year old male who presented to the emergency room today after a mechanical fall resulting in ankle fracture.  The patient tripped up in a weed eater and immediately felt pain in his ankle and went down.  The patient has been in generally good health as of late and has no particular complaints.  He does have a significant history of coronary artery disease, which will be detailed below, but by most recent testing in 2009 showed no evidence of significant recurrence.  The patient is quite active at baseline, can go up flight stairs with ease, can walk quite easily and has no chest pain or shortness of breath with this.  He did have problems of syncope in the past, for which, he was followed Dr. Riley Kill; however, this has not been an issue for some time.  He also had a history of an arrhythmia of some kind whether SVT or atrial flutter in the past; however, this could not be induced and ablation was aborted. He has had no further problems with that as well.  REVIEW OF SYSTEMS:  Negative for fever, changes to his vision, sore throat, rash, chest pain, shortness of breath, nausea, vomiting, abdominal pain, diarrhea, dysuria, or bleeding.  PAST MEDICAL HISTORY: 1. Diabetes mellitus type 2, which is controlled.  His blood sugars     remaining mostly under 120. 2. Hyperlipidemia. 3. Hypertension. 4. Neurocardiogenic syncope, which has resolved and has not been an     issue for some  time. 5. History of SVT or atrial flutter, again resolved and has not been     an issue for some time. 6. GERD, occasional symptoms. 7. Hiatal hernia. 8. TIA 4 years ago. 9. Adrenoleukodystrophy. 10.Peptic ulcer disease in 1964. 11.Subdural hematoma, traumatic. 12.Many many kidney stones.  PAST SURGICAL HISTORY: 1. Scrotal cyst excision on Feb 26, 2011. 2. C5-6 and C6-7 anterior cervical diskectomy. 3. Bladder calculus removal. 4. CABG in 1996. 5. TURP. 6. Gastrectomy and vagotomy. 7. Left heart catheterization on January 01, 2008, which showed single-     vessel coronary artery disease with a widely patent LIMA to the LAD     and diagonal.  There is well-preserved ejection fraction.  Risk     reduction was recommended.  ALLERGIES:  LIPITOR and CRESTOR, which cause muscle cramps and BETA- BLOCKERS, which causes bradycardia.  SOCIAL HISTORY:  Quit smoking in 1965, only rare alcohol.  Quite active as detailed above.  FAMILY HISTORY:  Father has cerebral hemorrhage.  MEDICATIONS:  Have been reviewed per the medical reconciliation.  PHYSICAL EXAMINATION:  GENERAL:  A well-developed and  well-nourished man in no acute distress. VITAL SIGNS:  Temperature is 98.9, pulse 85, respirations 16, blood pressure 133/72, sat 96%. HEAD:  Appears to be normal. EYES:  Sclerae clear.  Pupils are equal, round, reactive to light. Lids, irises and conjunctivae appear unremarkable. ENT:  Hearing is grossly normal.  Lips and tongue appear unremarkable. NECK:  Supple.  No lymphadenopathy or masses.  No thyromegaly. CHEST:  Clear to auscultation bilaterally.  No wheezes, rales or rhonchi.  There is normal respiratory effort. CARDIOVASCULAR:  Regular rate and rhythm.  No murmur, rub, gallop. EXTREMITIES:  No right lower extremity edema.  He does have 2+ left ankle edema from his fracture. SKIN:  Normal without rash or induration, it is nontender to palpation. PSYCHIATRIC:  Grossly normal mood and  affect.  Speech is fluent and appropriate.  IMAGING: 1. Chest x-ray, no acute disease.  Hiatal hernia seen. 2. Foot and ankle films, no fractures.  Defer interpretation and     treatment to Orthopedics.  EKG independently reviewed and shows sinus bradycardia with a rate of 55, one PVC, no acute changes are seen.  CBC within normal limits.  Basic metabolic panel unremarkable.  IMPRESSION:  This is a 76 year old man who sustained a mechanical fall today resulting in ankle fracture. 1. History of coronary artery disease.  The patient does have a     history of coronary artery disease, but his most recent left heart     catheterization in 2009 was very reassuring.  He has had no     recurrence of symptoms or evidence of disease.  He remains chest     pain free and is quite active.  Additionally, he did tolerate     surgery 2 weeks ago.  At this point, we would recommend no further     evaluation, proceed with surgery as per     Orthopedics.  Do note that the patient is intolerant to beta-     blockers. 2. Diabetes mellitus type 2, well controlled by report.  Thank you for this consultation.     Brendia Sacks, MD     DG/MEDQ  D:  03/13/2011  T:  03/13/2011  Job:  [id number]  Electronically Signed by Brendia Sacks  on 03/22/2011 04:37:17 PM

## 2011-04-30 ENCOUNTER — Encounter: Payer: Self-pay | Admitting: Cardiology

## 2011-05-12 NOTE — Discharge Summary (Signed)
NAMEMarland Kitchen  TREMON, SAINVIL NO.:  [id number]  MEDICAL RECORD NO.:  [id number]  LOCATION:  5023                         FACILITY:  [facility]  PHYSICIAN:  Harvie Junior, M.D.   DATE OF BIRTH:  May 18, 1935  DATE OF ADMISSION:  03/13/2011 DATE OF DISCHARGE:  03/15/2011                              DISCHARGE SUMMARY   ADMITTING DIAGNOSES: 1. Comminuted trimalleolar fracture, left ankle. 2. History of coronary artery disease. 3. Type 2 diabetes mellitus. 4. Metatarsal fractures, left foot of second through fourth     metatarsals. 5. Diabetes mellitus type 2. 6. Hyperlipidemia. 7. Hypertension. 8. Neurocardiogenic syncope. 9. History of supraventricular tachycardia or atrial flutter,     resolved. 10.Gastroesophageal reflux disease. 11.Hiatal hernia. 12.History of transient ischemic attack 4 years ago. 13.Adrenoleukodystrophy. 14.Peptic ulcer disease in remote past. 15.Subdural hematoma, traumatic and remote past. 16.History of renal calculi.  DISCHARGE DIAGNOSES: 1. Comminuted trimalleolar fracture, left ankle. 2. History of coronary artery disease. 3. Type 2 diabetes mellitus. 4. Metatarsal fractures, left foot of second through fourth     metatarsals. 5. Hiatal hernia.  PROCEDURES IN HOSPITAL:  Open reduction and internal fixation, left ankle.  BRIEF HISTORY:  This is a 76 year old diabetic male with a history of coronary artery bypass surgery who fell earlier on the day of admission. The patient complained inability to weightbear and painful left ankle with swelling, and no previous history of trauma.  He presented to the [facility] Emergency Room where x-rays showed a displaced trimalleolar left ankle fracture with second, third, and fourth metatarsal fractures, left foot.  He is admitted for treatment of these and for surgical intervention of his displaced left ankle fracture. Preoperatively, medical consult was to be obtained.  PERTINENT  LABORATORY STUDIES:  The patient's hemoglobin on admission was 13.7, hematocrit 39.5, WBC 8.5.  On postop day #1, his hemoglobin was 11.8 with hematocrit of 35.1.  Platelet count was 179k.  Indices within normal limits.  Protime on admission was 14.6 seconds, INR of 1.12, and PTT of 25.  CMET was within normal range.  His potassium on admission was 4.4.  On postop day #1, his potassium was 4.4.  BUN and creatinine were normal.  His EGFR was greater than 60.  CBG on May 2012 was 99 and stayed below 125.  EKG on Mar 13, 2011, showed sinus rhythm with ventricular premature complex.  No significant change since last tracing.  Left ankle x-rays on day of admission showed displaced trimalleolar fractures.  Chest x-ray showed no acute disease with a hiatal hernia.  X-rays of the left foot showed fractures of the second, third, and fourth metatarsals visually.  HOSPITAL COURSE:  The patient was admitted to the hospital.  He had a medical clearance by Brendia Sacks, MD.  This was greatly appreciated. He was cleared for surgery ultimately.  The patient was brought to the operating room where an ORIF of his left ankle is well described in Dr. Luiz Blare' operative note on Mar 13, 2011.  He also had a closed treatment of his second, third, and fourth metatarsal fractures at the time of his anesthesia.  Preoperatively, he was given a gram of Ancef IV q.8  h x3 doses postoperatively prophylactically.  Physical therapy is ordered for walker ambulation, nonweightbearing on the left.  IV pain medicine was ordered as well as oral pain medicine.  On postop day #1, he had complaints of left ankle pain.  He is taking fluids and voiding without difficulty.  His vital signs were stay, was afebrile.  The posterior splint was intact to his left lower extremity, moved his toes actively. His hemoglobin was 11.8.  His BMET was normal.  His splint was loosened as it was a bit tight.  His PCA morphine pump has been  ordered for pain, was continued.  He is continued with physical therapy.  On postop day #2, he was without complaints.  He was afebrile.  His vital signs were stable.  His lungs were clear.  He was progressing with physical therapy safely.  He was discharged home in improved condition.  He will be on a diabetic diet.  His activity status will be nonweightbearing on the left, ambulating with a walker.  MEDICATIONS ON DISCHARGE: 1. Vitamin D3 2000 units 1 daily. 2. Simvastatin 80 mg 1 daily. 3. KCl 10 mEq 1 b.i.d. 4. Plavix 75 mg 1 q.a.m. 5. Metformin 500 mg 1 b.i.d. 6. Melatonin 5 mg 1 at bedtime. 7. Lyrica 75 mg 1 q.a.m. 8. Loperamide 2 mg 1 tablet daily at bedtime. 9. Enteric-coated aspirin 81 mg 1 daily. 10.Alprazolam 1 mg 1 daily at bedtime. 11.He was also given Rx for Percocet 5 mg 1-2 q.6 h. p.r.n. pain.  He will follow up with Dr. Luiz Blare in his office in 2 weeks or sooner should any problems occur.     Marshia Ly, P.A.   ______________________________ Harvie Junior, M.D.    JB/MEDQ  D:  05/07/2011  T:  05/08/2011  Job:  [id number]  Electronically Signed by Marshia Ly P.A. on 05/12/2011 04:43:17 PM Electronically Signed by Jodi Geralds M.D. on 05/12/2011 09:09:12 PM

## 2011-05-21 ENCOUNTER — Encounter: Payer: Self-pay | Admitting: *Deleted

## 2011-05-24 ENCOUNTER — Ambulatory Visit (INDEPENDENT_AMBULATORY_CARE_PROVIDER_SITE_OTHER): Payer: Medicare Other | Admitting: Cardiology

## 2011-05-24 ENCOUNTER — Encounter: Payer: Self-pay | Admitting: Cardiology

## 2011-05-24 VITALS — BP 100/70 | HR 66 | Resp 18 | Ht 71.0 in | Wt 160.0 lb

## 2011-05-24 DIAGNOSIS — R55 Syncope and collapse: Secondary | ICD-10-CM

## 2011-05-24 DIAGNOSIS — E785 Hyperlipidemia, unspecified: Secondary | ICD-10-CM

## 2011-05-24 DIAGNOSIS — I2510 Atherosclerotic heart disease of native coronary artery without angina pectoris: Secondary | ICD-10-CM

## 2011-05-24 DIAGNOSIS — G459 Transient cerebral ischemic attack, unspecified: Secondary | ICD-10-CM

## 2011-05-24 NOTE — Patient Instructions (Signed)
Your physician recommends that you schedule a follow-up appointment in: 1 year  

## 2011-06-05 NOTE — Assessment & Plan Note (Signed)
Has been on DAPT for this.  Will not change.

## 2011-06-05 NOTE — Assessment & Plan Note (Signed)
Thought to be neurally mediated in the past.  Keep well hydrated.

## 2011-06-05 NOTE — Progress Notes (Signed)
HPI:  John [name] is in for follow up.  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  He had a number of breaks, and he could not place the foot on the floor for about 9 weeks.  He has not had chest pain, and he has attempted to stay well hydrated.  No current chest pain.    Current Outpatient Prescriptions  Medication Sig Dispense Refill  . ALPRAZolam (XANAX) 1 MG tablet Take 1 mg by mouth at bedtime as needed.        [name] aspirin 81 MG tablet Take 81 mg by mouth daily.        . B Complex-C (B COMPLEX-VITAMIN C PO) Take 1 tablet by mouth daily.        . Cholecalciferol (VITAMIN D3) 2000 UNITS capsule Take 2,000 Units by mouth daily.        . clopidogrel (PLAVIX) 75 MG tablet Take 75 mg by mouth daily.        . metFORMIN (GLUCOPHAGE) 500 MG tablet Take 500 mg by mouth 2 (two) times daily with a meal.        . nitroGLYCERIN (NITROSTAT) 0.4 MG SL tablet Place 0.4 mg under the tongue every 5 (five) minutes as needed.        . potassium citrate (UROCIT-K) 10 MEQ (1080 MG) SR tablet Take 10 mEq by mouth 2 (two) times daily.        . simvastatin (ZOCOR) 80 MG tablet Take 80 mg by mouth. Take one tablet daily except on M-W and F take 1/2 tablet         Allergies  Allergen Reactions  . Beta Adrenergic Blockers     REACTION: Bradycardia    Past Medical History  Diagnosis Date  . TIA   . SYNCOPE   . HYPERTENSION   . HYPERLIPIDEMIA   . CAD, ARTERY BYPASS GRAFT   . TRANSIENT ISCHEMIC ATTACKS, HX OF   . SUPRAVENTRICULAR TACHYCARDIA, HX OF   . NEPHROLITHIASIS, HX OF   . Leukodystrophy   . GASTROESOPHAGEAL REFLUX DISEASE   . DIABETES MELLITUS, TYPE II   . COPD   . ASTHMA     Past Surgical History  Procedure Date  . Coronary artery bypass graft   . Cardiac catheterization   . Gastrectomy   . Vagotomy   . Cystourethroscopy with stent removal.   . Sebaceous cyst removal     No family history on file.  History   Social History  . Marital Status: Married   Spouse Name: N/A    Number of Children: N/A  . Years of Education: N/A   Occupational History  . Not on file.   Social History Main Topics  . Smoking status: Former Smoker    Types: Cigarettes    Quit date: 10/25/1962  . Smokeless tobacco: Not on file  . Alcohol Use: No  . Drug Use: No  . Sexually Active: Not on file   Other Topics Concern  . Not on file   Social History Narrative  . No narrative on file    ROS: Please see the HPI.  All other systems reviewed and negative.  PHYSICAL EXAM:  BP 100/70  Pulse 66  Resp 18  Ht 5\' 11"  (1.803 m)  Wt 160 lb (72.576 kg)  BMI 22.32 kg/m2  General: Well developed, well nourished, in no acute distress. Head:  Normocephalic and atraumatic. Chest:  Sternotomy well healed.   Neck: no  JVD Lungs: Clear to auscultation and percussion. Heart: Normal S1 and S2.  No murmur, rubs or gallops.  Abdomen:  Normal bowel sounds; soft; non tender; no organomegaly Pulses: Pulses normal in all 4 extremities. Extremities: No clubbing or cyanosis. No edema. Neurologic: Alert and oriented x 3.  EKG:  NSR.  Minimal voltage for LVH, otherwise normal.    ASSESSMENT AND PLAN:

## 2011-06-05 NOTE — Assessment & Plan Note (Signed)
Remains stable at the present time.  Continue medical therapy.

## 2011-06-05 NOTE — Assessment & Plan Note (Signed)
Managed by his primary care MD.  We have discussed drug interactions.

## 2011-07-15 ENCOUNTER — Encounter: Payer: Self-pay | Admitting: Cardiology

## 2011-07-19 LAB — LIPID PANEL
Cholesterol: 117
LDL Cholesterol: 66
Total CHOL/HDL Ratio: 2.7
VLDL: 7

## 2011-07-19 LAB — CBC
HCT: 35.1 — ABNORMAL LOW
Hemoglobin: 12 — ABNORMAL LOW
MCHC: 34.4
MCHC: 34.7
MCV: 94.3
MCV: 94.5
MCV: 94.6
Platelets: 158
Platelets: 179
Platelets: 181
RBC: 3.62 — ABNORMAL LOW
RBC: 4.01 — ABNORMAL LOW
RDW: 13.2
RDW: 13.2
RDW: 13.2
WBC: 4.9
WBC: 5.5
WBC: 6.9

## 2011-07-19 LAB — CARDIAC PANEL(CRET KIN+CKTOT+MB+TROPI)
CK, MB: 0.9
CK, MB: 1.2
Relative Index: INVALID
Total CK: 39
Total CK: 39

## 2011-07-19 LAB — I-STAT 8, (EC8 V) (CONVERTED LAB)
Acid-base deficit: 1
BUN: 12
Bicarbonate: 25.5 — ABNORMAL HIGH
Chloride: 111
HCT: 37 — ABNORMAL LOW
Hemoglobin: 12.6 — ABNORMAL LOW
Operator id: 294341
Sodium: 143
pCO2, Ven: 50.7 — ABNORMAL HIGH

## 2011-07-19 LAB — BASIC METABOLIC PANEL
BUN: 11
BUN: 11
BUN: 11
BUN: 12
BUN: 14
CO2: 25
CO2: 25
Calcium: 8.5
Calcium: 8.8
Chloride: 102
Chloride: 111
Chloride: 112
Chloride: 112
Creatinine, Ser: 1.04
Creatinine, Ser: 1.1
Creatinine, Ser: 1.13
GFR calc Af Amer: >60
GFR calc Af Amer: >60
GFR calc non Af Amer: >60
GFR calc non Af Amer: >60
GFR calc non Af Amer: >60
Glucose, Bld: 105 — ABNORMAL HIGH
Glucose, Bld: 95
Glucose, Bld: 95
Potassium: 2.7 — CL
Potassium: 3.9
Sodium: 141
Sodium: 143

## 2011-07-19 LAB — CK TOTAL AND CKMB (NOT AT ARMC)
Relative Index: INVALID
Total CK: 34

## 2011-07-19 LAB — APTT: aPTT: 26

## 2011-07-19 LAB — DIFFERENTIAL
Basophils Relative: 1
Eosinophils Absolute: 0.1
Eosinophils Relative: 2
Neutrophils Relative %: 47

## 2011-07-19 LAB — POCT I-STAT CREATININE: Creatinine, Ser: 1.2

## 2011-07-19 LAB — D-DIMER, QUANTITATIVE: D-Dimer, Quant: 0.33

## 2011-07-19 LAB — POCT CARDIAC MARKERS: CKMB, poc: <1 — ABNORMAL LOW

## 2011-07-19 LAB — PROTIME-INR: Prothrombin Time: 14.5

## 2011-07-19 LAB — HEPARIN LEVEL (UNFRACTIONATED): Heparin Unfractionated: 0.93 — ABNORMAL HIGH

## 2011-07-26 LAB — TSH: TSH: 1.543

## 2011-07-26 LAB — CBC
HCT: 37.7 — ABNORMAL LOW
Hemoglobin: 12.5 — ABNORMAL LOW
Hemoglobin: 12.8 — ABNORMAL LOW
MCHC: 34.2
MCHC: 34.4
MCV: 93.2
Platelets: 197
RBC: 3.89 — ABNORMAL LOW
RBC: 4.01 — ABNORMAL LOW
RBC: 4.06 — ABNORMAL LOW
RDW: 13.6
RDW: 13.6
WBC: 4.6

## 2011-07-26 LAB — BASIC METABOLIC PANEL
CO2: 25
Calcium: 9.3
Chloride: 110
Creatinine, Ser: 1.02
GFR calc Af Amer: >60
GFR calc Af Amer: >60
GFR calc non Af Amer: >60
Potassium: 4.3
Sodium: 138
Sodium: 140

## 2011-07-26 LAB — PROTIME-INR
INR: 1
INR: 1.1
Prothrombin Time: 15

## 2011-07-26 LAB — CK TOTAL AND CKMB (NOT AT ARMC)
CK, MB: 1.2
CK, MB: 1.4
Relative Index: INVALID
Relative Index: INVALID
Relative Index: INVALID
Total CK: 34
Total CK: 40
Total CK: 53

## 2011-07-26 LAB — URINALYSIS, ROUTINE W REFLEX MICROSCOPIC
Glucose, UA: 500 — AB
Ketones, ur: NEGATIVE
Protein, ur: NEGATIVE
Urobilinogen, UA: 1

## 2011-07-26 LAB — GLUCOSE, CAPILLARY
Glucose-Capillary: 113 — ABNORMAL HIGH
Glucose-Capillary: 98

## 2011-07-26 LAB — TROPONIN I
Troponin I: 0.01
Troponin I: 0.01
Troponin I: <0.01

## 2011-07-26 LAB — URINE MICROSCOPIC-ADD ON

## 2011-07-26 LAB — DIFFERENTIAL
Basophils Absolute: 0
Basophils Relative: 1
Lymphocytes Relative: 16
Neutro Abs: 5.4
Neutrophils Relative %: 73

## 2011-07-26 LAB — LIPID PANEL
Triglycerides: 75
VLDL: 15

## 2011-07-26 LAB — POCT I-STAT, CHEM 8
Calcium, Ion: 1.21
HCT: 36 — ABNORMAL LOW
Hemoglobin: 12.2 — ABNORMAL LOW
TCO2: 24

## 2011-07-26 LAB — URINE CULTURE
Colony Count: NO GROWTH
Culture: NO GROWTH

## 2011-07-26 LAB — POCT CARDIAC MARKERS: Myoglobin, poc: 50.4

## 2011-07-26 LAB — APTT: aPTT: 25

## 2011-08-26 ENCOUNTER — Ambulatory Visit (HOSPITAL_COMMUNITY)
Admission: RE | Admit: 2011-08-26 | Discharge: 2011-08-26 | Disposition: A | Discharge status: Home / Self Care | Payer: Medicare Other | Source: Ambulatory Visit | Attending: Internal Medicine | Admitting: Internal Medicine

## 2011-08-26 ENCOUNTER — Other Ambulatory Visit (HOSPITAL_COMMUNITY): Payer: Self-pay | Admitting: Internal Medicine

## 2011-08-26 DIAGNOSIS — R0602 Shortness of breath: Secondary | ICD-10-CM | POA: Insufficient documentation

## 2011-08-26 DIAGNOSIS — K449 Diaphragmatic hernia without obstruction or gangrene: Secondary | ICD-10-CM | POA: Insufficient documentation

## 2011-08-26 DIAGNOSIS — J069 Acute upper respiratory infection, unspecified: Secondary | ICD-10-CM | POA: Insufficient documentation

## 2011-09-09 ENCOUNTER — Other Ambulatory Visit: Payer: Self-pay | Admitting: Internal Medicine

## 2011-09-09 DIAGNOSIS — R634 Abnormal weight loss: Secondary | ICD-10-CM

## 2011-09-10 ENCOUNTER — Ambulatory Visit
Admission: RE | Admit: 2011-09-10 | Discharge: 2011-09-10 | Disposition: A | Discharge status: Home / Self Care | Payer: Medicare Other | Source: Ambulatory Visit | Attending: Internal Medicine | Admitting: Internal Medicine

## 2011-09-10 DIAGNOSIS — R634 Abnormal weight loss: Secondary | ICD-10-CM

## 2011-09-10 MED ORDER — IOHEXOL 300 MG/ML  SOLN
100.0000 mL | Freq: Once | INTRAMUSCULAR | Status: AC | PRN
  Administered 2011-09-10: 100 mL via INTRAVENOUS

## 2011-09-13 ENCOUNTER — Telehealth: Payer: Self-pay | Admitting: Gastroenterology

## 2011-09-13 NOTE — Telephone Encounter (Signed)
Pt scheduled to see Dr. Arlyce Dice tomorrow at 3:15pm. Aram Beecham to notify pt of appt date and time and fax records.

## 2011-09-14 ENCOUNTER — Ambulatory Visit (INDEPENDENT_AMBULATORY_CARE_PROVIDER_SITE_OTHER): Payer: Medicare Other | Admitting: Gastroenterology

## 2011-09-14 ENCOUNTER — Encounter: Payer: Self-pay | Admitting: Gastroenterology

## 2011-09-14 ENCOUNTER — Other Ambulatory Visit (INDEPENDENT_AMBULATORY_CARE_PROVIDER_SITE_OTHER): Payer: Medicare Other

## 2011-09-14 DIAGNOSIS — Z860100 Personal history of colon polyps, unspecified: Secondary | ICD-10-CM | POA: Insufficient documentation

## 2011-09-14 DIAGNOSIS — R110 Nausea: Secondary | ICD-10-CM

## 2011-09-14 DIAGNOSIS — R634 Abnormal weight loss: Secondary | ICD-10-CM

## 2011-09-14 LAB — CBC WITH DIFFERENTIAL/PLATELET
Basophils Absolute: 0 10*3/uL (ref 0.0–0.1)
Basophils Relative: 0.4 % (ref 0.0–3.0)
Eosinophils Absolute: 0.1 10*3/uL (ref 0.0–0.7)
HCT: 41.8 % (ref 39.0–52.0)
Hemoglobin: 14.2 g/dL (ref 13.0–17.0)
Lymphs Abs: 1.7 10*3/uL (ref 0.7–4.0)
MCHC: 33.9 g/dL (ref 30.0–36.0)
Monocytes Relative: 9.3 % (ref 3.0–12.0)
Neutro Abs: 2.6 10*3/uL (ref 1.4–7.7)
RBC: 4.4 Mil/uL (ref 4.22–5.81)
RDW: 14.8 % — ABNORMAL HIGH (ref 11.5–14.6)

## 2011-09-14 LAB — COMPREHENSIVE METABOLIC PANEL
ALT: 54 U/L — ABNORMAL HIGH (ref 0–53)
AST: 40 U/L — ABNORMAL HIGH (ref 0–37)
Creatinine, Ser: 1.4 mg/dL (ref 0.4–1.5)
Total Bilirubin: 0.8 mg/dL (ref 0.3–1.2)

## 2011-09-14 NOTE — Progress Notes (Signed)
History of Present Illness: [name] is a 76 year old white male referred at the request of  John Cowboy, MD for evaluation of weight loss.  Over the past 6 months he's lost 22 pounds but lost 12 pounds in the last 2 weeks. He complains of profound anorexia and slight nausea. He has early satiety. There's been no change in his medications.  He is complaining of loss of energy and easy fatigability. CT scan on September 09, 2011, which I reviewed, demonstrated a complex right renal cyst and bilateral renal cysts. The right small inguinal hernia was also seen. Lab work from September, 2012 included a normal CBC and conference of metabolic profile. Hemoglobin A1c was 5.8.  He is status post vagotomy and questionable gastrectomy from years ago. He has non-insulin-dependent diabetes mellitus.  He had a couple of very brief episodes of midepigastric pain in the last 2 weeks. He denies change in bowel habits, melena or hematochezia. GI history is pertinent for colonic polyposis seen on several colonoscopies. His last colonoscopy 2007 was negative for polyps.     Past Medical History  Diagnosis Date  . TIA   . SYNCOPE   . HYPERTENSION   . HYPERLIPIDEMIA   . CAD, ARTERY BYPASS GRAFT   . TRANSIENT ISCHEMIC ATTACKS, HX OF   . SUPRAVENTRICULAR TACHYCARDIA, HX OF   . NEPHROLITHIASIS, HX OF   . Leukodystrophy   . GASTROESOPHAGEAL REFLUX DISEASE   . DIABETES MELLITUS, TYPE II   . COPD   . ASTHMA   . Diverticulosis   . Colon polyps     hyperplastic polyps  . Hiatal hernia   . Insomnia    Past Surgical History  Procedure Date  . Coronary artery bypass graft   . Cardiac catheterization   . Gastrectomy   . Vagotomy   . Cystourethroscopy with stent removal.   . Sebaceous cyst removal    family history includes Alcohol abuse in his father; Diabetes in his brother; and Heart disease in his brother and paternal grandmother. Current Outpatient Prescriptions  Medication Sig Dispense Refill  .  ALPRAZolam (XANAX) 1 MG tablet Take 1 mg by mouth at bedtime as needed.        [name] aspirin 81 MG tablet Take 81 mg by mouth daily.        . B Complex-C (B COMPLEX-VITAMIN C PO) Take 1 tablet by mouth daily.        . Cholecalciferol (VITAMIN D3) 2000 UNITS capsule Take 2,000 Units by mouth daily.        . clopidogrel (PLAVIX) 75 MG tablet Take 75 mg by mouth daily.        . nitroGLYCERIN (NITROSTAT) 0.4 MG SL tablet Place 0.4 mg under the tongue every 5 (five) minutes as needed.        . potassium citrate (UROCIT-K) 10 MEQ (1080 MG) SR tablet Take 10 mEq by mouth 2 (two) times daily.        . simvastatin (ZOCOR) 80 MG tablet Take 80 mg by mouth. Take one tablet daily except on M-W and F take 1/2 tablet        Allergies as of 09/14/2011 - Review Complete 09/14/2011  Allergen Reaction Noted  . Beta adrenergic blockers  03/12/2009    reports that he quit smoking about 48 years ago. His smoking use included Cigarettes. He quit smokeless tobacco use about 28 years ago. He reports that he does not drink alcohol or use illicit drugs.     Review  of Systems: Pertinent positive and negative review of systems were noted in the above HPI section. All other review of systems were otherwise negative.  Vital signs were reviewed in today's medical record Physical Exam: General: Well developed , well nourished, no acute distress Head: Normocephalic and atraumatic Eyes:  sclerae anicteric, EOMI Ears: Normal auditory acuity Mouth: No deformity or lesions Neck: Supple, no masses or thyromegaly Lungs: Clear throughout to auscultation Heart: Regular rate and rhythm; no murmurs, rubs or bruits Abdomen: Soft, non tender and non distended. No masses, hepatosplenomegaly or hernias noted. Normal Bowel sounds Rectal: There are no rectal masses; stool is Hemoccult negative Musculoskeletal: Symmetrical with no gross deformities  Skin: No lesions on visible extremities Pulses:  Normal pulses noted Extremities: No  clubbing, cyanosis, edema or deformities noted Neurological: Alert oriented x 4, grossly nonfocal Cervical Nodes:  No significant cervical adenopathy Inguinal Nodes: No significant inguinal adenopathy Psychological:  Alert and cooperative. Normal mood and affect

## 2011-09-14 NOTE — Patient Instructions (Signed)
Your Endoscopy is scheduled on 09/20/2011 at 3:30pm Separate instructions given Stay on Plavix per Dr Lindell Spar will go to the basement for labs today

## 2011-09-14 NOTE — Assessment & Plan Note (Addendum)
Etiology for weight loss is not apparent by CT or lab. Active peptic ulcer disease and upper GI neoplasm are considerations. In addition, patient may have gastroparesis related to his diabetes and/or his vagotomy.  Occult neoplasm is not evident by CT scan.  Recommendations #1 upper endoscopy #2 gastric emptying scan if #1 is negative #3 to consider colonoscopy pending results of above

## 2011-09-14 NOTE — Assessment & Plan Note (Signed)
Patient will need followup colonoscopy pending results of his workup regarding weight loss

## 2011-09-20 ENCOUNTER — Encounter: Payer: Self-pay | Admitting: Gastroenterology

## 2011-09-20 ENCOUNTER — Ambulatory Visit (AMBULATORY_SURGERY_CENTER): Payer: Medicare Other | Admitting: Gastroenterology

## 2011-09-20 DIAGNOSIS — K5190 Ulcerative colitis, unspecified, without complications: Secondary | ICD-10-CM

## 2011-09-20 DIAGNOSIS — R110 Nausea: Secondary | ICD-10-CM

## 2011-09-20 DIAGNOSIS — R634 Abnormal weight loss: Secondary | ICD-10-CM

## 2011-09-20 DIAGNOSIS — K269 Duodenal ulcer, unspecified as acute or chronic, without hemorrhage or perforation: Secondary | ICD-10-CM

## 2011-09-20 DIAGNOSIS — K2990 Gastroduodenitis, unspecified, without bleeding: Secondary | ICD-10-CM

## 2011-09-20 MED ORDER — SODIUM CHLORIDE 0.9 % IV SOLN: 500.0000 mL | INTRAVENOUS | Status: DC

## 2011-09-20 MED ORDER — ESOMEPRAZOLE MAGNESIUM 40 MG PO CPDR: 40.0000 mg | DELAYED_RELEASE_CAPSULE | Freq: Every day | ORAL | Status: DC

## 2011-09-20 NOTE — Progress Notes (Signed)
Patient did not experience any of the following events: a burn prior to discharge; a fall within the facility; wrong site/side/patient/procedure/implant event; or a hospital transfer or hospital admission upon discharge from the facility. (G8907) Patient did not have preoperative order for IV antibiotic SSI prophylaxis. (G8918)  

## 2011-09-20 NOTE — Patient Instructions (Addendum)
Schedule OV 6-8 weeks  SEE GREEN AND BLUE SHEETS FOR ADDITIONAL D/C INSTRUCTIONS.

## 2011-09-21 ENCOUNTER — Telehealth: Payer: Self-pay | Admitting: Gastroenterology

## 2011-09-21 ENCOUNTER — Other Ambulatory Visit: Payer: Self-pay | Admitting: Gastroenterology

## 2011-09-21 ENCOUNTER — Telehealth: Payer: Self-pay | Admitting: *Deleted

## 2011-09-21 NOTE — Telephone Encounter (Signed)
Pt scheduled for UGI at [facility] 09-27-11 arrival time 9:15am for a 9:30am appt. Pt knows to be NPO after midnight. Pt aware of appt date and time. Pt will be called back to schedule egd with dil closer to due date.

## 2011-09-21 NOTE — Telephone Encounter (Signed)

## 2011-09-21 NOTE — Telephone Encounter (Signed)
Pt states he had EGD yesterday and was told to call for another egd with dil in 6-8-weeks. This is not on the procedure report. There is a note to schedule a UGI series. Dr. Arlyce Dice please advise.

## 2011-09-21 NOTE — Telephone Encounter (Signed)
He needs an upper GI series to rule out a paraesophageal hiatal hernia.  He has esophageal stricture and is actually symptomatic. He'll need an Endo with dilatation. I postponed this for about 8 weeks since I will be out of the office. Note that he is on Plavix and we'll need to hold that if his PCP or cardiologist permits this.

## 2011-09-27 ENCOUNTER — Telehealth: Payer: Self-pay

## 2011-09-27 ENCOUNTER — Ambulatory Visit (HOSPITAL_COMMUNITY)
Admission: RE | Admit: 2011-09-27 | Discharge: 2011-09-27 | Disposition: A | Discharge status: Home / Self Care | Payer: Medicare Other | Source: Ambulatory Visit | Attending: Gastroenterology | Admitting: Gastroenterology

## 2011-09-27 DIAGNOSIS — R109 Unspecified abdominal pain: Secondary | ICD-10-CM | POA: Insufficient documentation

## 2011-09-27 DIAGNOSIS — K449 Diaphragmatic hernia without obstruction or gangrene: Secondary | ICD-10-CM | POA: Insufficient documentation

## 2011-09-27 NOTE — Telephone Encounter (Signed)
F/u 8 weeks

## 2011-09-27 NOTE — Telephone Encounter (Signed)
Message copied by Michele Mcalpine on Mon Sep 27, 2011  2:46 PM ------      Message from: Melvia Heaps D      Created: Mon Sep 27, 2011  2:38 PM       Please inform the patient that the xray showed the hiatal hernia only and to continue current plan of action

## 2011-09-27 NOTE — Progress Notes (Signed)
Quick Note:  Please inform the patient that the xray showed the hiatal hernia only and to continue current plan of action ______

## 2011-09-27 NOTE — Telephone Encounter (Signed)
Spoke with pt and he is aware. Pt requests that his records be faxed to his PCP, records were faxed to Dr. Oneta Rack. Pt wants to know if he needs a follow-up appt. Dr. Arlyce Dice please advise.

## 2011-09-27 NOTE — Telephone Encounter (Signed)
Pt just called back asking about having his esophagus dilated. Do you want this scheduled during your hospital week or after his OV in 8 weeks?

## 2011-09-28 NOTE — Telephone Encounter (Signed)
If he needs it now I would do it next week otherwise we can wait until after I see him

## 2011-09-28 NOTE — Telephone Encounter (Signed)
Spoke with pt and he is not having any problems at this time. Will plan for OV in 8 weeks and then the EGD with dil. Pt aware that we will call him when schedule it out to give him appt.

## 2011-10-14 ENCOUNTER — Telehealth: Payer: Self-pay | Admitting: Gastroenterology

## 2011-10-14 NOTE — Telephone Encounter (Signed)
Pt instructed to call back at the end of January to call back and schedule appt with Dr. Arlyce Dice.

## 2011-12-20 ENCOUNTER — Emergency Department (HOSPITAL_COMMUNITY)
Admission: EM | Admit: 2011-12-20 | Discharge: 2011-12-20 | Disposition: A | Discharge status: Home / Self Care | Payer: Medicare Other | Attending: Emergency Medicine | Admitting: Emergency Medicine

## 2011-12-20 ENCOUNTER — Other Ambulatory Visit: Payer: Self-pay

## 2011-12-20 ENCOUNTER — Encounter (HOSPITAL_COMMUNITY): Payer: Self-pay | Admitting: Emergency Medicine

## 2011-12-20 ENCOUNTER — Emergency Department (HOSPITAL_COMMUNITY): Payer: Medicare Other

## 2011-12-20 DIAGNOSIS — E785 Hyperlipidemia, unspecified: Secondary | ICD-10-CM | POA: Insufficient documentation

## 2011-12-20 DIAGNOSIS — R112 Nausea with vomiting, unspecified: Secondary | ICD-10-CM | POA: Insufficient documentation

## 2011-12-20 DIAGNOSIS — R0602 Shortness of breath: Secondary | ICD-10-CM | POA: Insufficient documentation

## 2011-12-20 DIAGNOSIS — I10 Essential (primary) hypertension: Secondary | ICD-10-CM | POA: Insufficient documentation

## 2011-12-20 DIAGNOSIS — Z79899 Other long term (current) drug therapy: Secondary | ICD-10-CM | POA: Insufficient documentation

## 2011-12-20 DIAGNOSIS — J189 Pneumonia, unspecified organism: Secondary | ICD-10-CM | POA: Insufficient documentation

## 2011-12-20 DIAGNOSIS — E119 Type 2 diabetes mellitus without complications: Secondary | ICD-10-CM | POA: Insufficient documentation

## 2011-12-20 DIAGNOSIS — I25810 Atherosclerosis of coronary artery bypass graft(s) without angina pectoris: Secondary | ICD-10-CM | POA: Insufficient documentation

## 2011-12-20 DIAGNOSIS — Z8673 Personal history of transient ischemic attack (TIA), and cerebral infarction without residual deficits: Secondary | ICD-10-CM | POA: Insufficient documentation

## 2011-12-20 LAB — CBC
HCT: 40.6 % (ref 39.0–52.0)
MCHC: 34.2 g/dL (ref 30.0–36.0)
MCV: 92.3 fL (ref 78.0–100.0)
Platelets: 196 10*3/uL (ref 150–400)
RDW: 13.3 % (ref 11.5–15.5)

## 2011-12-20 LAB — BASIC METABOLIC PANEL
CO2: 28 mEq/L (ref 19–32)
GFR calc Af Amer: 62 mL/min — ABNORMAL LOW (ref >90)
GFR calc non Af Amer: 53 mL/min — ABNORMAL LOW (ref >90)
Glucose, Bld: 93 mg/dL (ref 70–99)
Potassium: 3.8 mEq/L (ref 3.5–5.1)
Sodium: 140 mEq/L (ref 135–145)

## 2011-12-20 MED ORDER — MOXIFLOXACIN HCL 400 MG PO TABS
400.0000 mg | ORAL_TABLET | Freq: Once | ORAL | Status: AC
  Administered 2011-12-20: 400 mg via ORAL
  Filled 2011-12-20: qty 1

## 2011-12-20 MED ORDER — SODIUM CHLORIDE 0.9 % IV SOLN
Freq: Once | INTRAVENOUS | Status: AC
  Administered 2011-12-20: 14:00:00 via INTRAVENOUS

## 2011-12-20 MED ORDER — MOXIFLOXACIN HCL 400 MG PO TABS: 400.0000 mg | ORAL_TABLET | Freq: Every day | ORAL | Status: AC

## 2011-12-20 NOTE — ED Notes (Signed)
EMS states 3 day Hx of N/V/D, had MD visit with Dr. Milinda Cave this am and was sent from his office.

## 2011-12-20 NOTE — Discharge Instructions (Signed)
You likely have a pneumonia in your left lower lung. I have sent a prescription for an antibiotic to your normal pharmacy [facility]). Please take the moxifloxacin once daily starting tomorrow, Tuesday 2/26. You are receiving 1 dose here in [place] today. If your cough does not improve or you develop fever or confusion or falls please come back to the emergency department or your regular doctor's office.

## 2011-12-20 NOTE — ED Notes (Signed)
Pt. Is unable to use the restroom at this time. He tried to use the urinal and was unable to go at this time.

## 2011-12-20 NOTE — ED Notes (Signed)
ZOX:WR60<AV> Expected date:12/20/11<BR> Expected time:12:11 PM<BR> Means of arrival:Ambulance<BR> Comments:<BR> M61. 77 yo m. From dr office. Weak, n/v x 3 days. 5 mins

## 2011-12-20 NOTE — ED Provider Notes (Signed)
History     CSN: [id number]  Arrival date & time 12/20/11  1222   First MD Initiated Contact with Patient 12/20/11 1342      Chief Complaint  Patient presents with  . Nausea    (Consider location/radiation/quality/duration/timing/severity/associated sxs/prior treatment) HPI  Patient is a 77 year old man who presents with 5 days of nausea/vomiting, productive cough w/ yellow sputum, and fever w/ shaking chills. Patient was vomiting until today. Patient was acutely febrile, no measured temperature, on Saturday. He has been around his 84 and 38 year old grandchildren last week who were sick. His wife also reports that he seems confused and was 'rambling' since yesterday. He has not been taking much food or drink by mouth, only sips of pepsi and tea. He also had a fall on Saturday evening and a few on Sunday morning. His wife reports that he was like 'a newborn calf' and was very weak and unsteady and hard to get up of the floor. He denies any injury to his body or head. His wife reports that when she came to help him his head was between the bed and the nightstand. He denies any worsened diarrhea (has some diarrhea at baseline) or dysuria. Denies any hematochezia or melena. He was seen by Dr. Arlyce Dice at [facility] for endoscopy to evaluate his nausea. Patient reports he is to go back for dilation of esophageal stricture.  Past Medical History  Diagnosis Date  . TIA   . SYNCOPE   . HYPERTENSION   . HYPERLIPIDEMIA   . CAD, ARTERY BYPASS GRAFT   . TRANSIENT ISCHEMIC ATTACKS, HX OF   . SUPRAVENTRICULAR TACHYCARDIA, HX OF   . NEPHROLITHIASIS, HX OF   . Leukodystrophy   . GASTROESOPHAGEAL REFLUX DISEASE   . DIABETES MELLITUS, TYPE II   . ASTHMA   . Diverticulosis   . Colon polyps     hyperplastic polyps  . Hiatal hernia   . Insomnia   . Anxiety   . Arthritis     LEFT ANKLE  . Blood transfusion   . COPD     PT. DENIES  . Stroke     MINI 4/5 YEARS AGO  . Ulcer     Past Surgical  History  Procedure Date  . Coronary artery bypass graft   . Cardiac catheterization   . Gastrectomy   . Vagotomy   . Cystourethroscopy with stent removal.   . Sebaceous cyst removal   . Colonoscopy   . Upper gastrointestinal endoscopy   . Hemorrhoid surgery     Family History  Problem Relation Age of Onset  . Heart disease Brother   . Diabetes Brother   . Alcohol abuse Father   . Heart disease Paternal Grandmother     History  Substance Use Topics  . Smoking status: Former Smoker    Types: Cigarettes    Quit date: 10/25/1962  . Smokeless tobacco: Former Neurosurgeon    Quit date: 10/25/1982  . Alcohol Use: No    Review of Systems  Allergies  Beta adrenergic blockers  Home Medications   Current Outpatient Rx  Name Route Sig Dispense Refill  . ALPRAZOLAM 1 MG PO TABS Oral Take 1 mg by mouth at bedtime as needed.      . ASPIRIN 81 MG PO TABS Oral Take 81 mg by mouth daily.      [name] VITAMIN D3 2000 UNITS PO CAPS Oral Take 2,000 Units by mouth daily.      [name]  CLOPIDOGREL BISULFATE 75 MG PO TABS Oral Take 75 mg by mouth daily.      [name] POTASSIUM CITRATE ER 10 MEQ (1080 MG) PO TBCR Oral Take 10 mEq by mouth 2 (two) times daily.      [name] PREGABALIN 75 MG PO CAPS Oral Take 75 mg by mouth daily.    [name] RANITIDINE HCL 150 MG PO TABS Oral Take 300 mg by mouth daily. Pt takes 2 tabs of the 150 mg for 300 mg dose    . MOXIFLOXACIN HCL 400 MG PO TABS Oral Take 1 tablet (400 mg total) by mouth daily. 9 tablet 0  . NITROGLYCERIN 0.4 MG SL SUBL Sublingual Place 0.4 mg under the tongue every 5 (five) minutes as needed.      [name] SIMVASTATIN 80 MG PO TABS Oral Take 40-80 mg by mouth See admin instructions. Take one tablet daily except on M-W and F take 1/2 tablet      BP 150/71  Pulse 82  Temp 98.8 F (37.1 C)  Resp 14  SpO2 100%  Physical Exam General: pleasant elderly man resting in bed, appears weak HEENT: PERRL, EOMI, no scleral icterus, left lid is droopy in comparison to right. Smile is  also asymmetric. Cardiac: RRR, no rubs, murmurs or gallops Pulm: patient coughs with deep breath so it is difficult to evaluate for focal abnormalities Abd: soft, nontender, nondistended, BS present Ext: warm and well perfused, no pedal edema Neuro: alert and oriented X3, cranial nerves II-XII grossly intact, strength and sensation to light touch intact in bilateral upper & lower extremities  ED Course  Procedures (including critical care time)  Date: 12/20/2011  Rate: 86  Rhythm: premature ventricular contractions (PVC)  QRS Axis: normal  Intervals: normal  ST/T Wave abnormalities: normal  Conduction Disutrbances:none  Narrative Interpretation:   Old EKG Reviewed: unchanged    Labs Reviewed  BASIC METABOLIC PANEL - Abnormal; Notable for the following:    GFR calc non Af Amer 53 (*)    GFR calc Af Amer 62 (*)    All other components within normal limits  CBC  URINALYSIS, ROUTINE W REFLEX MICROSCOPIC   Dg Chest 2 View  12/20/2011  *RADIOLOGY REPORT*  Clinical Data: Shortness of breath  CHEST - 2 VIEW  Comparison: None.  Findings: Mild patchy left lower lobe opacity, atelectasis versus pneumonia. No pleural effusion or pneumothorax.  Cardiomediastinal silhouette is within normal limits. Postsurgical changes related to prior CABG.  Degenerative changes of the visualized thoracolumbar spine. Cervical spine fixation hardware.  IMPRESSION: Mild patchy left lower lobe opacity, atelectasis versus pneumonia.  Original Report Authenticated By: Charline Bills, M.D.     1. Pneumonia       MDM  Will get CBC, BMET and UA and chest X-ray to evaluate for source of infection. Will rehydrate with 750cc bolus of normal saline. Will discharge to home with close follow up with PCP with 10 day course of moxifloxacin.       Margorie John, MD 12/20/11 1518  Margorie John, MD 12/20/11 [phone]

## 2011-12-21 NOTE — ED Provider Notes (Signed)
Medical screening examination/treatment/procedure(s) were conducted as a shared visit with non-physician practitioner(s) and myself.  I personally evaluated the patient during the encounter Pt w prod cough. Denies sob. No faintness or dizziness. Alert, oriented, ?basilar rale.  Suzi Roots, MD 12/21/11 [phone]

## 2011-12-29 ENCOUNTER — Encounter: Payer: Self-pay | Admitting: Gastroenterology

## 2011-12-29 ENCOUNTER — Ambulatory Visit (INDEPENDENT_AMBULATORY_CARE_PROVIDER_SITE_OTHER): Payer: Medicare Other | Admitting: Gastroenterology

## 2011-12-29 DIAGNOSIS — K269 Duodenal ulcer, unspecified as acute or chronic, without hemorrhage or perforation: Secondary | ICD-10-CM

## 2011-12-29 DIAGNOSIS — Z86010 Personal history of colonic polyps: Secondary | ICD-10-CM

## 2011-12-29 DIAGNOSIS — R634 Abnormal weight loss: Secondary | ICD-10-CM

## 2011-12-29 HISTORY — DX: Duodenal ulcer, unspecified as acute or chronic, without hemorrhage or perforation: K26.9

## 2011-12-29 NOTE — Progress Notes (Signed)
History of Present Illness:  John [name] has returned for followup of weight loss. He was evaluated by endoscopy in November, 2012 which demonstrated a duodenal ulcer. Since that time he has regained his weight. He has no GI complaints.  The patient has a history of adenomatous colon polyps. Last colonoscopy in 2007 demonstrated a small hyperplastic polyp.    Review of Systems: He recently had pneumonia complicated by vomiting and dehydration. Symptoms have subsided. Pertinent positive and negative review of systems were noted in the above HPI section. All other review of systems were otherwise negative.    Current Medications, Allergies, Past Medical History, Past Surgical History, Family History and Social History were reviewed in [facility] electronic medical record  Vital signs were reviewed in today's medical record. Physical Exam: General: Well developed , well nourished, no acute distress gical:  Alert and cooperative. Normal mood and affect

## 2011-12-29 NOTE — Assessment & Plan Note (Signed)
Plan followup colonoscopy in 2017 

## 2011-12-29 NOTE — Assessment & Plan Note (Signed)
Possibly secondary to duodenal ulcer. Weight loss has resolved.

## 2011-12-29 NOTE — Patient Instructions (Signed)
Follow up as needed

## 2012-06-29 ENCOUNTER — Ambulatory Visit (INDEPENDENT_AMBULATORY_CARE_PROVIDER_SITE_OTHER): Payer: Medicare Other | Admitting: Cardiology

## 2012-06-29 ENCOUNTER — Encounter: Payer: Self-pay | Admitting: Cardiology

## 2012-06-29 VITALS — BP 98/52 | HR 56 | Ht 71.0 in | Wt 179.0 lb

## 2012-06-29 DIAGNOSIS — I2510 Atherosclerotic heart disease of native coronary artery without angina pectoris: Secondary | ICD-10-CM

## 2012-06-29 DIAGNOSIS — I25810 Atherosclerosis of coronary artery bypass graft(s) without angina pectoris: Secondary | ICD-10-CM

## 2012-06-29 DIAGNOSIS — R55 Syncope and collapse: Secondary | ICD-10-CM

## 2012-06-29 DIAGNOSIS — Z8679 Personal history of other diseases of the circulatory system: Secondary | ICD-10-CM

## 2012-06-29 DIAGNOSIS — E785 Hyperlipidemia, unspecified: Secondary | ICD-10-CM

## 2012-06-29 NOTE — Patient Instructions (Signed)
Your physician wants you to follow-up in: 1 YEAR with Dr McAlhany.  You will receive a reminder letter in the mail two months in advance. If you don't receive a letter, please call our office to schedule the follow-up appointment.  Your physician recommends that you continue on your current medications as directed. Please refer to the Current Medication list given to you today.  

## 2012-07-01 NOTE — Progress Notes (Signed)
HPI:  John [name] is in for followup. He really is doing quite well overall.  However, since he was last seen he had an ulcer, and had a change in his reflux medications.  He also had an endo a few months ago.   Today, we went over his medicines and detail. I also reviewed again for him his mechanisms for syncope, and how to try to avoid this. Overall, he seems to be doing well from a purely cardiac perspective.  He has been on chronic plavix, but I think this was started primarily for a ministroke in the past.    Current Outpatient Prescriptions  Medication Sig Dispense Refill  . ALPRAZolam (XANAX) 1 MG tablet Take 1 mg by mouth at bedtime as needed.        [name] aspirin 81 MG tablet Take 81 mg by mouth daily.        . Cholecalciferol (VITAMIN D3) 2000 UNITS capsule Take 2,000 Units by mouth daily.        . clopidogrel (PLAVIX) 75 MG tablet Take 75 mg by mouth daily.        [name] gabapentin (NEURONTIN) 300 MG capsule Take 300 mg by mouth 2 (two) times daily.       . nitroGLYCERIN (NITROSTAT) 0.4 MG SL tablet Place 0.4 mg under the tongue every 5 (five) minutes as needed.        . pantoprazole (PROTONIX) 40 MG tablet Take 40 mg by mouth 2 (two) times daily.      . potassium citrate (UROCIT-K) 10 MEQ (1080 MG) SR tablet Take 10 mEq by mouth 2 (two) times daily.        . simvastatin (ZOCOR) 80 MG tablet Take 40-80 mg by mouth See admin instructions. Take one tablet daily except on M-W and F take 1/2 tablet        Allergies  Allergen Reactions  . Beta Adrenergic Blockers     REACTION: Bradycardia    Past Medical History  Diagnosis Date  . TIA   . SYNCOPE   . HYPERTENSION   . HYPERLIPIDEMIA   . CAD, ARTERY BYPASS GRAFT   . TRANSIENT ISCHEMIC ATTACKS, HX OF   . SUPRAVENTRICULAR TACHYCARDIA, HX OF   . NEPHROLITHIASIS, HX OF   . Leukodystrophy   . GASTROESOPHAGEAL REFLUX DISEASE   . DIABETES MELLITUS, TYPE II   . ASTHMA   . Diverticulosis   . Colon polyps     hyperplastic polyps  . Hiatal  hernia   . Insomnia   . Anxiety   . Arthritis     LEFT ANKLE  . Blood transfusion   . COPD     PT. DENIES  . Stroke     MINI 4/5 YEARS AGO  . Ulcer     Past Surgical History  Procedure Date  . Coronary artery bypass graft   . Cardiac catheterization   . Gastrectomy   . Vagotomy   . Cystourethroscopy with stent removal.   . Sebaceous cyst removal   . Colonoscopy   . Upper gastrointestinal endoscopy   . Hemorrhoid surgery     Family History  Problem Relation Age of Onset  . Heart disease Brother   . Diabetes Brother   . Alcohol abuse Father   . Heart disease Paternal Grandmother     History   Social History  . Marital Status: Married    Spouse Name: N/A    Number of Children: 2  . Years of Education: N/A  Occupational History  . retired    Social History Main Topics  . Smoking status: Former Smoker    Types: Cigarettes    Quit date: 10/25/1962  . Smokeless tobacco: Former Neurosurgeon    Quit date: 10/25/1982  . Alcohol Use: No  . Drug Use: No  . Sexually Active: Not on file   Other Topics Concern  . Not on file   Social History Narrative  . No narrative on file    ROS: Please see the HPI.  All other systems reviewed and negative.  PHYSICAL EXAM:  BP 98/52  Pulse 56  Ht 5\' 11"  (1.803 m)  Wt 179 lb (81.194 kg)  BMI 24.97 kg/m2  SpO2 96%  General: Well developed, well nourished, in no acute distress. Head:  Normocephalic and atraumatic. Neck: no JVD Lungs: Clear to auscultation and percussion. Heart: Normal S1 and S2.  No murmur, rubs or gallops.  Abdomen:  Normal bowel sounds; soft; non tender; no organomegaly Pulses: Pulses normal in all 4 extremities. Extremities: No clubbing or cyanosis. No edema. Neurologic: Alert and oriented x 3.  EKG:  SB with sinus arrhythmia.  No acute changes.  ASSESSMENT AND PLAN:

## 2012-07-18 NOTE — Assessment & Plan Note (Signed)
Patient has a LIMA to the diagonal and LAD.  He has remained stable from this standpoint.  Last cath is 2002, in [facility]. Continue medical therapy.

## 2012-07-18 NOTE — Assessment & Plan Note (Signed)
I believe this is the reason for plavix.  There are no clear cardiac indications.  Will defer to Dr. Oneta Rack.

## 2012-07-18 NOTE — Assessment & Plan Note (Signed)
Mechanisms of syncope reviewed.  Needs to maintain hydration.

## 2012-07-18 NOTE — Assessment & Plan Note (Addendum)
Management under the care of his  primary.

## 2012-07-26 ENCOUNTER — Telehealth: Payer: Self-pay | Admitting: Gastroenterology

## 2012-07-26 NOTE — Telephone Encounter (Signed)
Called Dr. Oneta Rack and requested records be faxed.

## 2012-07-26 NOTE — Telephone Encounter (Signed)
Pt states he had a physical yesterday and some of his labs were low. States he may need a colon sooner than recall date. Pt scheduled to see Dr. Arlyce Dice 08/16/12@10 :30am. Pt aware of appt date and time.

## 2012-08-10 ENCOUNTER — Encounter: Payer: Self-pay | Admitting: Gastroenterology

## 2012-08-10 ENCOUNTER — Other Ambulatory Visit: Payer: Self-pay | Admitting: Gastroenterology

## 2012-08-10 DIAGNOSIS — D649 Anemia, unspecified: Secondary | ICD-10-CM

## 2012-08-10 NOTE — Progress Notes (Signed)
Patient ID: John [name], male   DOB: May 27, 1935, 77 y.o.   MRN: [id number]  Lab work from [name], MD office  on July 25, 2012 demonstrated a hemoglobin 13.7, ferritin 37, percent saturation 11.  Patient may have an early iron deficiency although he's not anemic.  Plan stool Hemoccults

## 2012-08-16 ENCOUNTER — Encounter: Payer: Self-pay | Admitting: Gastroenterology

## 2012-08-16 ENCOUNTER — Ambulatory Visit (INDEPENDENT_AMBULATORY_CARE_PROVIDER_SITE_OTHER): Payer: Medicare Other | Admitting: Gastroenterology

## 2012-08-16 ENCOUNTER — Other Ambulatory Visit (INDEPENDENT_AMBULATORY_CARE_PROVIDER_SITE_OTHER): Payer: Medicare Other

## 2012-08-16 VITALS — BP 128/68 | HR 50 | Ht 71.5 in | Wt 179.0 lb

## 2012-08-16 DIAGNOSIS — R634 Abnormal weight loss: Secondary | ICD-10-CM

## 2012-08-16 DIAGNOSIS — E611 Iron deficiency: Secondary | ICD-10-CM

## 2012-08-16 DIAGNOSIS — D649 Anemia, unspecified: Secondary | ICD-10-CM

## 2012-08-16 DIAGNOSIS — D509 Iron deficiency anemia, unspecified: Secondary | ICD-10-CM

## 2012-08-16 LAB — CBC WITH DIFFERENTIAL/PLATELET
Basophils Absolute: 0 10*3/uL (ref 0.0–0.1)
Eosinophils Absolute: 0.2 10*3/uL (ref 0.0–0.7)
Lymphocytes Relative: 34.9 % (ref 12.0–46.0)
MCHC: 33.3 g/dL (ref 30.0–36.0)
Neutrophils Relative %: 53.3 % (ref 43.0–77.0)
RBC: 4.43 Mil/uL (ref 4.22–5.81)
RDW: 13.6 % (ref 11.5–14.6)

## 2012-08-16 NOTE — Assessment & Plan Note (Signed)
He has regained weight

## 2012-08-16 NOTE — Patient Instructions (Addendum)
You will go to the basement for labs and hemoccult test today

## 2012-08-16 NOTE — Progress Notes (Signed)
History of Present Illness: Mr. Rochin has returned for evaluation of abnormal blood work. On routine testing on October 2 it was noted that his serum iron was 37, percent saturation 11 and TIBC 350. Hemoglobin was 13.7 and MCV 90. Hemoccults were given to the patient but he did not return them. He has no GI complaints including change of bowel habits, abdominal pain, melena or hematochezia. He has a history of colon polyps. Last colonoscopy in 2007 demonstrated a small hyperplastic polyp. In November, 2012 upper endoscopy demonstrated a duodenal ulcer. He takes Protonix and is on Plavix.    Past Medical History  Diagnosis Date  . TIA   . SYNCOPE   . HYPERTENSION   . HYPERLIPIDEMIA   . CAD, ARTERY BYPASS GRAFT   . TRANSIENT ISCHEMIC ATTACKS, HX OF   . SUPRAVENTRICULAR TACHYCARDIA, HX OF   . NEPHROLITHIASIS, HX OF   . Leukodystrophy   . GASTROESOPHAGEAL REFLUX DISEASE   . DIABETES MELLITUS, TYPE II   . ASTHMA   . Diverticulosis   . Colon polyps     hyperplastic polyps  . Hiatal hernia   . Insomnia   . Anxiety   . Arthritis     LEFT ANKLE  . Blood transfusion   . COPD     PT. DENIES  . Stroke     MINI 4/5 YEARS AGO  . Ulcer    Past Surgical History  Procedure Date  . Coronary artery bypass graft   . Cardiac catheterization   . Gastrectomy   . Vagotomy   . Cystourethroscopy with stent removal.   . Sebaceous cyst removal   . Colonoscopy   . Upper gastrointestinal endoscopy   . Hemorrhoid surgery    family history includes Alcohol abuse in his father; Diabetes in his brother; and Heart disease in his brother and paternal grandmother. Current Outpatient Prescriptions  Medication Sig Dispense Refill  . ALPRAZolam (XANAX) 1 MG tablet Take 1 mg by mouth at bedtime as needed.        [name] aspirin 81 MG tablet Take 81 mg by mouth daily.        . Cholecalciferol (VITAMIN D3) 2000 UNITS capsule Take 2,000 Units by mouth daily.        . clopidogrel (PLAVIX) 75 MG tablet Take 75 mg  by mouth daily.        [name] gabapentin (NEURONTIN) 300 MG capsule Take 300 mg by mouth 2 (two) times daily.       . nitroGLYCERIN (NITROSTAT) 0.4 MG SL tablet Place 0.4 mg under the tongue every 5 (five) minutes as needed.        . pantoprazole (PROTONIX) 40 MG tablet Take 40 mg by mouth 2 (two) times daily.      . potassium citrate (UROCIT-K) 10 MEQ (1080 MG) SR tablet Take 10 mEq by mouth 2 (two) times daily.        . simvastatin (ZOCOR) 80 MG tablet Take 40-80 mg by mouth See admin instructions. Take one tablet daily except on M-W and F take 1/2 tablet       Allergies as of 08/16/2012 - Review Complete 08/16/2012  Allergen Reaction Noted  . Beta adrenergic blockers  03/12/2009    reports that he quit smoking about 49 years ago. His smoking use included Cigarettes. He quit smokeless tobacco use about 29 years ago. He reports that he does not drink alcohol or use illicit drugs.     Review of Systems: Pertinent positive and negative  review of systems were noted in the above HPI section. All other review of systems were otherwise negative.  Vital signs were reviewed in today's medical record Physical Exam: General: Well developed , well nourished, no acute distress Head: Normocephalic and atraumatic Eyes:  sclerae anicteric, EOMI Ears: Normal auditory acuity Mouth: No deformity or lesions Neck: Supple, no masses or thyromegaly Lungs: Clear throughout to auscultation Heart: Regular rate and rhythm; no murmurs, rubs or bruits Abdomen: Soft, non tender and non distended. No masses, hepatosplenomegaly or hernias noted. Normal Bowel sounds Rectal:deferred Musculoskeletal: Symmetrical with no gross deformities  Skin: No lesions on visible extremities Pulses:  Normal pulses noted Extremities: No clubbing, cyanosis, edema or deformities noted Neurological: Alert oriented x 4, grossly nonfocal Cervical Nodes:  No significant cervical adenopathy Inguinal Nodes: No significant inguinal  adenopathy Psychological:  Alert and cooperative. Normal mood and affect

## 2012-08-16 NOTE — Assessment & Plan Note (Signed)
Question of iron deficiency has been raised on the basis of a low serum iron and percent saturation. At the same time, hemoglobin is normal. Patient has no GI complaints.  Recommendations #1 Hemoccults #2 check serum ferritin level and followup hemoglobin

## 2012-08-22 ENCOUNTER — Other Ambulatory Visit (INDEPENDENT_AMBULATORY_CARE_PROVIDER_SITE_OTHER): Payer: Medicare Other

## 2012-08-22 DIAGNOSIS — D649 Anemia, unspecified: Secondary | ICD-10-CM

## 2012-08-22 LAB — FECAL OCCULT BLOOD, IMMUNOCHEMICAL: Fecal Occult Bld: POSITIVE

## 2012-08-25 ENCOUNTER — Telehealth: Payer: Self-pay | Admitting: Gastroenterology

## 2012-08-25 NOTE — Telephone Encounter (Signed)
Left message for pt to call back  °

## 2012-08-25 NOTE — Telephone Encounter (Signed)
Spoke with pt, see result note.  

## 2012-09-05 ENCOUNTER — Encounter: Payer: Self-pay | Admitting: Gastroenterology

## 2012-09-05 ENCOUNTER — Ambulatory Visit (AMBULATORY_SURGERY_CENTER): Payer: Medicare Other | Admitting: *Deleted

## 2012-09-05 VITALS — Ht 71.5 in | Wt 181.6 lb

## 2012-09-05 DIAGNOSIS — K921 Melena: Secondary | ICD-10-CM

## 2012-09-05 DIAGNOSIS — D649 Anemia, unspecified: Secondary | ICD-10-CM

## 2012-09-05 MED ORDER — NA SULFATE-K SULFATE-MG SULF 17.5-3.13-1.6 GM/177ML PO SOLN: ORAL | Status: DC

## 2012-09-12 ENCOUNTER — Ambulatory Visit (AMBULATORY_SURGERY_CENTER): Payer: Medicare Other | Admitting: Gastroenterology

## 2012-09-12 ENCOUNTER — Encounter: Payer: Self-pay | Admitting: Gastroenterology

## 2012-09-12 VITALS — BP 166/64 | HR 52 | Temp 96.2°F | Resp 18 | Ht 71.5 in | Wt 181.0 lb

## 2012-09-12 DIAGNOSIS — D126 Benign neoplasm of colon, unspecified: Secondary | ICD-10-CM

## 2012-09-12 DIAGNOSIS — K2990 Gastroduodenitis, unspecified, without bleeding: Secondary | ICD-10-CM

## 2012-09-12 DIAGNOSIS — K222 Esophageal obstruction: Secondary | ICD-10-CM

## 2012-09-12 DIAGNOSIS — K6289 Other specified diseases of anus and rectum: Secondary | ICD-10-CM

## 2012-09-12 DIAGNOSIS — D649 Anemia, unspecified: Secondary | ICD-10-CM

## 2012-09-12 DIAGNOSIS — K2970 Gastritis, unspecified, without bleeding: Secondary | ICD-10-CM

## 2012-09-12 LAB — GLUCOSE, CAPILLARY
Glucose-Capillary: 98 mg/dL (ref 70–99)
Glucose-Capillary: 119 mg/dL — ABNORMAL HIGH (ref 70–99)
Glucose-Capillary: 77 mg/dL (ref 70–99)

## 2012-09-12 MED ORDER — SODIUM CHLORIDE 0.9 % IV SOLN: 500.0000 mL | INTRAVENOUS | Status: DC

## 2012-09-12 MED ORDER — FERROUS SULFATE 325 (65 FE) MG PO TBEC: 325.0000 mg | DELAYED_RELEASE_TABLET | Freq: Every day | ORAL | Status: DC

## 2012-09-12 NOTE — Patient Instructions (Addendum)

## 2012-09-12 NOTE — Progress Notes (Signed)
Patient did not have preoperative order for IV antibiotic SSI prophylaxis. (G8918)Patient did not have preoperative order for IV antibiotic SSI prophylaxis. (G8918)Patient did not experience any of the following events: a burn prior to discharge; a fall within the facility; wrong site/side/patient/procedure/implant event; or a hospital transfer or hospital admission upon discharge from the facility. (G8907) 

## 2012-09-12 NOTE — Progress Notes (Signed)
1625 pt assisted to barthroom prior to discharge . Pt. Says he sat in floor after using bathroom because he felt dizzy & did not want to fall . Assisted pt. Up into wheelchair & then to recliner to keep pt another 15 min or so to observe.

## 2012-09-12 NOTE — Op Note (Signed)
[facility] [address] [place], [address]   COLONOSCOPY PROCEDURE REPORT  PATIENT: John [name], John [name]  MR#: [id number] BIRTHDATE: Jan 10, 1935 , 77  yrs. old GENDER: Male ENDOSCOPIST: Louis Meckel, MD REFERRED [id number] Althea Charon, M.D. PROCEDURE DATE:  09/12/2012 PROCEDURE:   Colonoscopy with snare polypectomy and Colonoscopy with cold biopsy polypectomy ASA CLASS:   Class II INDICATIONS:Iron Deficiency Anemia and heme-positive stool. MEDICATIONS: MAC sedation, administered by CRNA and propofol (Diprivan) 100mg  IV  DESCRIPTION OF PROCEDURE:   After the risks benefits and alternatives of the procedure were thoroughly explained, informed consent was obtained.  A digital rectal exam revealed no abnormalities of the rectum.   The LB [id number]  endoscope was introduced through the anus and advanced to the cecum, which was identified by both the appendix and ileocecal valve. No adverse events experienced.   The quality of the prep was Suprep excellent The instrument was then slowly withdrawn as the colon was fully examined.      COLON FINDINGS: In the cecum and extending to the descending transverse and descending colon there were approximately 17-18 sessile polyps.  The largest polyp measured greater than 1 cm. Other polyps range from 2-5 the largest polyp was removed with a hot polypectomy snare.  Other polyps greater than 2 mm were removed with a cold polypectomy snare.  The remaining polyps were removed with cold biopsy forceps.  Specimens were submitted to pathology. A single 5 mm sessile polyp in the sigmoid that was removed with cold polypectomy snare.  In the rectal vault there is hemorrhagic mucosa with slight edema. There was no fresh or old blood.  Biopsies were taken.  There were scattered diverticula in the sigmoid and descending colon.  Retroflexed views revealed no abnormalities. The time to cecum=9 minutes 40 seconds.  Withdrawal  time=33 minutes 50 seconds. The scope was withdrawn and the procedure completed. COMPLICATIONS: There were no complications.  ENDOSCOPIC IMPRESSION: 1.  Colonic PolyposisI 2.  Proctitis 3.  Diverticulosis  RECOMMENDATIONS: 1.   await biopsy results 2.   followup colonoscopy one year  eSigned:  Louis Meckel, MD 09/12/2012 3:38 PM   cc:   PATIENT NAME:  John [name], John [name] MR#: [id number]

## 2012-09-12 NOTE — Progress Notes (Signed)
Pt's skin warm and dry color pink , pt says he feels ok now . No shaking observed . Care partner sent for the car & pt transported to car per wheelchair @ 1655 per Elmon Kirschner, RN

## 2012-09-12 NOTE — Op Note (Signed)
[facility] [address] [place], [address]   ENDOSCOPY PROCEDURE REPORT  PATIENT: John [name], John [name]  MR#: [id number] BIRTHDATE: 09-Aug-1935 , 77  yrs. old GENDER: Male ENDOSCOPIST: Louis Meckel, MD REFERRED BY:  [name], M.D. PROCEDURE DATE:  09/12/2012 PROCEDURE:  EGD w/ biopsy and Maloney dilation of esophagus ASA CLASS:     Class II INDICATIONS:  Iron deficiency anemia.   Occult blood positive. MEDICATIONS: There was residual sedation effect present from prior procedure, MAC sedation, administered by CRNA, propofol (Diprivan) 50mg  IV, and Simethicone 0.6cc PO TOPICAL ANESTHETIC:  DESCRIPTION OF PROCEDURE: After the risks benefits and alternatives of the procedure were thoroughly explained, informed consent was obtained.  The LB-GIF Q180 [id number] endoscope was introduced through the mouth and advanced to the third portion of the duodenum. Without limitations.  The instrument was slowly withdrawn as the mucosa was fully examined.      In the gastric antrum there is deeply injected mucosa with submucosal hemorrhage suggestive of GAVE syndrome.  biopsies were taken.  in the gastric fundus and body there is diffuse erythema.  at the GE junction there was inflamed esophageal mucosa.  Biopsies were taken.  in early stricture was identified.  A #52 Jerene Dilling dilator was passed with mild resistance.   The remainder of the upper endoscopy exam was otherwise normal. Retroflexed views revealed no abnormalities.     The scope was then withdrawn from the patient and the procedure completed.  COMPLICATIONS: There were no complications. ENDOSCOPIC IMPRESSION: 1. 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  gastritis 3.  esophagitis 4.  esophageal stricture - s/p maloney dilitation  RECOMMENDATIONS: 1.   continue Protonix 2.   await biopsy results 3.   resume Plavix in 5 days REPEAT EXAM:  eSigned:  Louis Meckel, MD 09/12/2012 3:44 PM   CC:  PATIENT  NAME:  John [name], John [name] MR#: [id number]

## 2012-09-12 NOTE — Progress Notes (Signed)
Propofol given over incremental dosages 

## 2012-09-13 ENCOUNTER — Telehealth: Payer: Self-pay

## 2012-09-13 NOTE — Telephone Encounter (Signed)
  Follow up Call-  Call back number 09/12/2012 09/20/2011  Post procedure Call Back phone  # [phone] O.K TO LEAVE MESSAGE  Permission to leave phone message Yes -     Patient questions:  Do you have a fever, pain , or abdominal swelling? no Pain Score  0 *  Have you tolerated food without any problems? yes  Have you been able to return to your normal activities? yes  Do you have any questions about your discharge instructions: Diet   no Medications  no Follow up visit  no  Do you have questions or concerns about your Care? no  Actions: * If pain score is 4 or above: No action needed, pain <4.

## 2012-09-19 ENCOUNTER — Encounter: Payer: Self-pay | Admitting: Gastroenterology

## 2012-10-30 ENCOUNTER — Other Ambulatory Visit (INDEPENDENT_AMBULATORY_CARE_PROVIDER_SITE_OTHER): Payer: Medicare Other

## 2012-10-30 ENCOUNTER — Ambulatory Visit (INDEPENDENT_AMBULATORY_CARE_PROVIDER_SITE_OTHER): Payer: Medicare Other | Admitting: Gastroenterology

## 2012-10-30 ENCOUNTER — Encounter: Payer: Self-pay | Admitting: Gastroenterology

## 2012-10-30 VITALS — BP 112/68 | HR 80 | Ht 71.0 in | Wt 181.4 lb

## 2012-10-30 DIAGNOSIS — Q2733 Arteriovenous malformation of digestive system vessel: Secondary | ICD-10-CM

## 2012-10-30 DIAGNOSIS — K31819 Angiodysplasia of stomach and duodenum without bleeding: Secondary | ICD-10-CM | POA: Insufficient documentation

## 2012-10-30 DIAGNOSIS — Z86010 Personal history of colonic polyps: Secondary | ICD-10-CM

## 2012-10-30 DIAGNOSIS — D509 Iron deficiency anemia, unspecified: Secondary | ICD-10-CM

## 2012-10-30 DIAGNOSIS — E611 Iron deficiency: Secondary | ICD-10-CM

## 2012-10-30 LAB — CBC WITH DIFFERENTIAL/PLATELET
Basophils Relative: 0.5 % (ref 0.0–3.0)
Hemoglobin: 14.3 g/dL (ref 13.0–17.0)
Lymphocytes Relative: 39.7 % (ref 12.0–46.0)
Monocytes Relative: 8 % (ref 3.0–12.0)
Neutro Abs: 2.6 10*3/uL (ref 1.4–7.7)
RBC: 4.63 Mil/uL (ref 4.22–5.81)

## 2012-10-30 NOTE — Assessment & Plan Note (Signed)
Although he has had no overt GI bleeding he has multiple potential sources including the ectatic lesions in the stomach, polyps and proctitis.  Recommendations #1 repeat CBC and check every 3-6 months depending upon symptoms #2 continue iron supplementation

## 2012-10-30 NOTE — Patient Instructions (Addendum)
Go to the basement for labs 

## 2012-10-30 NOTE — Assessment & Plan Note (Addendum)
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 This certainly may be contributing to chronic GI blood loss.  Recommendations #1 repeat CBC. If hemoglobin is falling we'll commence with fulguration of the vascular ectatic lesions with the argon plasma coagulator

## 2012-10-30 NOTE — Assessment & Plan Note (Signed)
Colonoscopy demonstrated colonic polyposis. He clearly is a polyp former.  Plan followup colonoscopy one year

## 2012-10-30 NOTE — Progress Notes (Signed)
History of Present Illness:  [name] has returned following upper and lower endoscopy. 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  17 polyps were removed at colonoscopy. In addition, changes consistent with proctitis were seen and confirmed by biopsy. He has no specific GI complaints except for occasional diarrhea. He does complain of fatigue and lack of energy.  Hemoglobin in October, 2013 was 13.4. He is taking supplementary iron.    Review of Systems: Pertinent positive and negative review of systems were noted in the above HPI section. All other review of systems were otherwise negative.    Current Medications, Allergies, Past Medical History, Past Surgical History, Family History and Social History were reviewed in [employer] electronic medical record  Vital signs were reviewed in today's medical record. Physical Exam: General: Well developed , well nourished, no acute distress

## 2012-10-31 NOTE — Progress Notes (Signed)
Quick Note:  Please inform the patient that lab work was normal and to continue current plan of action ______ 

## 2013-07-05 ENCOUNTER — Encounter: Payer: Self-pay | Admitting: Gastroenterology

## 2013-08-10 ENCOUNTER — Ambulatory Visit (INDEPENDENT_AMBULATORY_CARE_PROVIDER_SITE_OTHER): Payer: Medicare Other | Admitting: Gastroenterology

## 2013-08-10 ENCOUNTER — Encounter: Payer: Self-pay | Admitting: Gastroenterology

## 2013-08-10 VITALS — BP 116/80 | HR 86 | Ht 65.11 in | Wt 172.4 lb

## 2013-08-10 DIAGNOSIS — Z860100 Personal history of colon polyps, unspecified: Secondary | ICD-10-CM

## 2013-08-10 MED ORDER — NA SULFATE-K SULFATE-MG SULF 17.5-3.13-1.6 GM/177ML PO SOLN: 1.0000 | Freq: Once | ORAL | Status: DC

## 2013-08-10 NOTE — Patient Instructions (Signed)

## 2013-08-10 NOTE — Progress Notes (Signed)
History of Present Illness:  78 year old white male with history of colonic polyposis, TIAs, on Plavix, coronary artery disease and diabetes here to schedule followup colonoscopy.  Approximately one year ago  about 18 polyps were removed at colonoscopy.  Mild proctitis was also seen.  The patient has no GI complaints.  He rarely has diarrhea.  He is without rectal bleeding.    Review of Systems: Pertinent positive and negative review of systems were noted in the above HPI section. All other review of systems were otherwise negative.    Current Medications, Allergies, Past Medical History, Past Surgical History, Family History and Social History were reviewed in [employer] electronic medical record  Vital signs were reviewed in today's medical record. Physical Exam: General: Well developed , well nourished, no acute distress Skin: anicteric Head: Normocephalic and atraumatic Eyes:  sclerae anicteric, EOMI Ears: Normal auditory acuity Mouth: No deformity or lesions Lungs: Clear throughout to auscultation Heart: Regular rate and rhythm; no murmurs, rubs or bruits Abdomen: Soft, non tender and non distended. No masses, hepatosplenomegaly or hernias noted. Normal Bowel sounds Rectal:deferred Musculoskeletal: Symmetrical with no gross deformities  Pulses:  Normal pulses noted Extremities: No clubbing, cyanosis, edema or deformities noted Neurological: Alert oriented x 4, grossly nonfocal Psychological:  Alert and cooperative. Normal mood and affect

## 2013-08-10 NOTE — Assessment & Plan Note (Addendum)
Patient had 18 polyps removed year ago.  Plan to repeat colonoscopy.  The risks for holding Plavix including the higher risk for CVA and TIAs was discussed.  He appears to understand this and is willing to hold Plavix prior to colonoscopy.

## 2013-08-14 ENCOUNTER — Encounter: Payer: Self-pay | Admitting: Cardiovascular Disease

## 2013-08-14 ENCOUNTER — Ambulatory Visit (INDEPENDENT_AMBULATORY_CARE_PROVIDER_SITE_OTHER): Payer: Medicare Other | Admitting: Cardiovascular Disease

## 2013-08-14 VITALS — BP 118/78 | HR 82 | Ht 71.0 in | Wt 172.8 lb

## 2013-08-14 DIAGNOSIS — I493 Ventricular premature depolarization: Secondary | ICD-10-CM

## 2013-08-14 DIAGNOSIS — R42 Dizziness and giddiness: Secondary | ICD-10-CM

## 2013-08-14 DIAGNOSIS — I2510 Atherosclerotic heart disease of native coronary artery without angina pectoris: Secondary | ICD-10-CM

## 2013-08-14 DIAGNOSIS — R0989 Other specified symptoms and signs involving the circulatory and respiratory systems: Secondary | ICD-10-CM

## 2013-08-14 DIAGNOSIS — I4949 Other premature depolarization: Secondary | ICD-10-CM

## 2013-08-14 MED ORDER — DILTIAZEM HCL ER COATED BEADS 120 MG PO CP24: 120.0000 mg | ORAL_CAPSULE | Freq: Every day | ORAL | Status: DC

## 2013-08-14 NOTE — Progress Notes (Signed)
/    History of Present Illness: 78 yo male with history of CAD s/p 2 V CABG 1996 , DM, HLD, TIA here today for cardiac follow up. He has been followed in the past by Dr. Riley Kill. Last cath 2002 with patent LIMA to LAD. He is on Plavix for stroke prevention per primary care.   He has been doing well. No chest pain or SOB. He does feel dizzy at times like his balance is off. No near  Syncope or syncope.   Primary Care Physician: Oneta Rack  Last Lipid Profile: Followed in primary care.    Past Medical History  Diagnosis Date  . TIA   . SYNCOPE   . HYPERTENSION   . HYPERLIPIDEMIA   . CAD, ARTERY BYPASS GRAFT   . TRANSIENT ISCHEMIC ATTACKS, HX OF   . SUPRAVENTRICULAR TACHYCARDIA, HX OF   . NEPHROLITHIASIS, HX OF   . Leukodystrophy   . GASTROESOPHAGEAL REFLUX DISEASE   . DIABETES MELLITUS, TYPE II   . ASTHMA   . Diverticulosis   . Hiatal hernia   . Insomnia   . Anxiety   . Arthritis     LEFT ANKLE  . Blood transfusion   . COPD     PT. DENIES  . Stroke     MINI 4/5 YEARS AGO  . Ulcer   . Colon polyps     adenomatous polyps    Past Surgical History  Procedure Laterality Date  . Coronary artery bypass graft  1996  . Cardiac catheterization    . Gastrectomy    . Vagotomy    . Cystourethroscopy with stent removal.    . Sebaceous cyst removal    . Colonoscopy    . Upper gastrointestinal endoscopy    . Hemorrhoid surgery    . Polypectomy      Current Outpatient Prescriptions  Medication Sig Dispense Refill  . ALPRAZolam (XANAX) 1 MG tablet Take 1 mg by mouth at bedtime as needed.        [name] aspirin 81 MG tablet Take 81 mg by mouth daily.        . Cholecalciferol (VITAMIN D3) 2000 UNITS capsule Take 2,000 Units by mouth daily.        . clopidogrel (PLAVIX) 75 MG tablet Take 75 mg by mouth daily.        . ferrous sulfate 325 (65 FE) MG EC tablet Take 1 tablet (325 mg total) by mouth daily with breakfast.  30 tablet  3  . gabapentin (NEURONTIN) 300 MG capsule Take 300 mg  by mouth 2 (two) times daily.       [name] LOTEMAX 0.5 % GEL Apply to eye as needed.       . nitroGLYCERIN (NITROSTAT) 0.4 MG SL tablet Place 0.4 mg under the tongue every 5 (five) minutes as needed.        . pantoprazole (PROTONIX) 40 MG tablet Take 40 mg by mouth 2 (two) times daily.      . potassium citrate (UROCIT-K) 10 MEQ (1080 MG) SR tablet Take 10 mEq by mouth 2 (two) times daily.        . promethazine (PHENERGAN) 25 MG tablet Take 25 mg by mouth as needed.       . simvastatin (ZOCOR) 80 MG tablet Take 1/2 tablet in the morning and 1 tablet at night      . Na Sulfate-K Sulfate-Mg Sulf (SUPREP BOWEL PREP) SOLN Take 1 kit by mouth once.  1 Bottle  0  No current facility-administered medications for this visit.    Allergies  Allergen Reactions  . Beta Adrenergic Blockers     REACTION: Bradycardia    History   Social History  . Marital Status: Married    Spouse Name: N/A    Number of Children: 2  . Years of Education: N/A   Occupational History  . retired    Social History Main Topics  . Smoking status: Former Smoker    Types: Cigarettes    Quit date: 10/25/1962  . Smokeless tobacco: Former Neurosurgeon    Quit date: 10/25/1982  . Alcohol Use: No  . Drug Use: No  . Sexual Activity: Not on file   Other Topics Concern  . Not on file   Social History Narrative  . No narrative on file    Family History  Problem Relation Age of Onset  . Heart disease Brother   . Diabetes Brother   . Alcohol abuse Father   . Heart disease Paternal Grandmother   . Colon cancer Neg Hx   . Stomach cancer Neg Hx     Review of Systems:  As stated in the HPI and otherwise negative.   BP 118/78  Pulse 82  Ht 5\' 11"  (1.803 m)  Wt 172 lb 12.8 oz (78.382 kg)  BMI 24.11 kg/m2  Physical Examination: General: Well developed, well nourished, NAD HEENT: OP clear, mucus membranes moist SKIN: warm, dry. No rashes. Neuro: No focal deficits Musculoskeletal: Muscle strength 5/5 all  ext Psychiatric: Mood and affect normal Neck: No JVD, no carotid bruits, no thyromegaly, no lymphadenopathy. Lungs:Clear bilaterally, no wheezes, rhonci, crackles Cardiovascular: Regular rate and rhythm. No murmurs, gallops or rubs. Abdomen:Soft. Bowel sounds present. Non-tender.  Extremities: No lower extremity edema. Pulses are 2 + in the bilateral DP/PT.  EKG: Sinus, rate 82 bpm. PVCs in pattern of trigeminy. Non-specific T wave abnormalities.   Cardiac cath March 2002:  The left main coronary artery is free of significant  disease.  The LAD coursed to the apex. There was about 80% segmental narrowing at the  origin of the large diagonal branch. The large diagonal branch itself had an  eccentric 80% plaque.  The internal mammary to the diagonal subsequently going to the LAD was patent.  There was perhaps irregularity beyond the insertion site at the diagonal  leading into the LAD segment but this was not focally significant. Runoff  into both diagonal and LAD distally was without significant narrowing.  The circumflex proper provided both two marginal branches and an AV  circumflex. There was minimal luminal irregularity but no high-grade  stenosis.  The right coronary artery was a dominant vessel providing posterior descending  and posterolateral branch. There is perhaps 30-40% narrowing throughout the  whole proximal and mid segment. However, high-grade focal disease was not  noted and this does not appear to be flow-limiting.   Assessment and Plan:   1.CAD: Stable. He is on Plavix daily and statin. He does not tolerate beta blockers. Will check echo to assess LV function. We discussed stress test but will talk more at next visit and arrange then.   2. HLD: On a statin. Lipids followed in primary care.   3. Carotid bruit: Will check carotid artery dopplers.   4. PVCs: Associated dizziness. Will start Cardizem CD 120 mg po qdaily.

## 2013-08-14 NOTE — Patient Instructions (Signed)
Your physician wants you to follow-up in: 6 months with Dr.McAlhany You will receive a reminder letter in the mail two months in advance. If you don't receive a letter, please call our office to schedule the follow-up appointment.  Your physician has requested that you have an echocardiogram. Echocardiography is a painless test that uses sound waves to create images of your heart. It provides your doctor with information about the size and shape of your heart and how well your heart's chambers and valves are working. This procedure takes approximately one hour. There are no restrictions for this procedure.   Your physician has requested that you have a carotid duplex. This test is an ultrasound of the carotid arteries in your neck. It looks at blood flow through these arteries that supply the brain with blood. Allow one hour for this exam. There are no restrictions or special instructions.  START Diltiazem CD 120mg  by mouth once a day

## 2013-08-15 ENCOUNTER — Encounter: Payer: Self-pay | Admitting: Gastroenterology

## 2013-08-16 ENCOUNTER — Telehealth: Payer: Self-pay | Admitting: Gastroenterology

## 2013-08-16 NOTE — Telephone Encounter (Signed)
Line busy - will try again later

## 2013-08-17 NOTE — Telephone Encounter (Signed)
We need to get approval by his PCP or cardiologist to hold Plavix

## 2013-08-17 NOTE — Telephone Encounter (Signed)
08/17/2013   RE: John [name] DOB: 01/25/1935 MRN: [id number]   Dear  Dr Earney Hamburg,    We have scheduled the above patient for an endoscopic procedure. Our records show that he is on anticoagulation therapy.   Please advise as to how long the patient may come off his therapy of Plavix prior to the procedure, which is scheduled for November.  Please fax back/ or route the completed form to Sarayah Bacchi at [phone].   Sincerely,    Merri Ray

## 2013-08-17 NOTE — Telephone Encounter (Signed)
Per your OV note on 08/10/13, you said for patient to hold Plavix for colonoscopy.

## 2013-08-17 NOTE — Telephone Encounter (Signed)
Did he approve holding Plavix?

## 2013-08-17 NOTE — Telephone Encounter (Signed)
We still need to determine whether we can hold Plavix

## 2013-08-17 NOTE — Telephone Encounter (Signed)
Spoke with patient and he saw his cardiologist. He started him on Cardizem 120 mg daily. He wanted to let Dr. Arlyce Dice know about this. He is also going to have an echo.

## 2013-08-17 NOTE — Telephone Encounter (Signed)
Per Dr. Gibson Ramp notes, patient is on Plavix via his PCP.

## 2013-08-20 ENCOUNTER — Encounter: Payer: Self-pay | Admitting: Internal Medicine

## 2013-08-20 ENCOUNTER — Encounter: Payer: Self-pay | Admitting: Cardiovascular Disease

## 2013-08-20 ENCOUNTER — Encounter: Payer: Self-pay | Admitting: Cardiology

## 2013-08-20 NOTE — Telephone Encounter (Signed)
John [name] is on Plavix for stroke prevention. He has no recent cardiac procedures. Will be ok to hold Plavix for 7 days before his planned GI procedure.   Earney Hamburg

## 2013-08-20 NOTE — Telephone Encounter (Signed)
Thank you :)

## 2013-08-21 NOTE — Telephone Encounter (Signed)
Patient aware to hold Plavix 7 days before

## 2013-08-23 ENCOUNTER — Encounter: Payer: Medicare Other | Admitting: Gastroenterology

## 2013-08-28 ENCOUNTER — Encounter: Payer: Medicare Other | Admitting: Gastroenterology

## 2013-08-30 ENCOUNTER — Ambulatory Visit (HOSPITAL_COMMUNITY): Payer: Medicare Other | Attending: Cardiovascular Disease

## 2013-08-30 DIAGNOSIS — I10 Essential (primary) hypertension: Secondary | ICD-10-CM | POA: Insufficient documentation

## 2013-08-30 DIAGNOSIS — I2510 Atherosclerotic heart disease of native coronary artery without angina pectoris: Secondary | ICD-10-CM | POA: Insufficient documentation

## 2013-08-30 DIAGNOSIS — I658 Occlusion and stenosis of other precerebral arteries: Secondary | ICD-10-CM | POA: Insufficient documentation

## 2013-08-30 DIAGNOSIS — E785 Hyperlipidemia, unspecified: Secondary | ICD-10-CM | POA: Insufficient documentation

## 2013-08-30 DIAGNOSIS — Z87891 Personal history of nicotine dependence: Secondary | ICD-10-CM | POA: Insufficient documentation

## 2013-08-30 DIAGNOSIS — I6529 Occlusion and stenosis of unspecified carotid artery: Secondary | ICD-10-CM | POA: Insufficient documentation

## 2013-08-30 DIAGNOSIS — J4489 Other specified chronic obstructive pulmonary disease: Secondary | ICD-10-CM | POA: Insufficient documentation

## 2013-08-30 DIAGNOSIS — E119 Type 2 diabetes mellitus without complications: Secondary | ICD-10-CM | POA: Insufficient documentation

## 2013-08-30 DIAGNOSIS — R42 Dizziness and giddiness: Secondary | ICD-10-CM

## 2013-08-30 DIAGNOSIS — J449 Chronic obstructive pulmonary disease, unspecified: Secondary | ICD-10-CM | POA: Insufficient documentation

## 2013-08-30 DIAGNOSIS — Z8673 Personal history of transient ischemic attack (TIA), and cerebral infarction without residual deficits: Secondary | ICD-10-CM | POA: Insufficient documentation

## 2013-08-30 DIAGNOSIS — R0989 Other specified symptoms and signs involving the circulatory and respiratory systems: Secondary | ICD-10-CM | POA: Insufficient documentation

## 2013-09-04 ENCOUNTER — Ambulatory Visit (HOSPITAL_COMMUNITY): Payer: Medicare Other | Attending: Cardiology | Admitting: Radiology

## 2013-09-04 ENCOUNTER — Other Ambulatory Visit: Payer: Self-pay

## 2013-09-04 ENCOUNTER — Encounter: Payer: Self-pay | Admitting: Cardiology

## 2013-09-04 DIAGNOSIS — I2510 Atherosclerotic heart disease of native coronary artery without angina pectoris: Secondary | ICD-10-CM

## 2013-09-04 DIAGNOSIS — I10 Essential (primary) hypertension: Secondary | ICD-10-CM | POA: Insufficient documentation

## 2013-09-04 DIAGNOSIS — J4489 Other specified chronic obstructive pulmonary disease: Secondary | ICD-10-CM | POA: Insufficient documentation

## 2013-09-04 DIAGNOSIS — E785 Hyperlipidemia, unspecified: Secondary | ICD-10-CM | POA: Insufficient documentation

## 2013-09-04 DIAGNOSIS — Z951 Presence of aortocoronary bypass graft: Secondary | ICD-10-CM | POA: Insufficient documentation

## 2013-09-04 DIAGNOSIS — R55 Syncope and collapse: Secondary | ICD-10-CM | POA: Insufficient documentation

## 2013-09-04 DIAGNOSIS — I498 Other specified cardiac arrhythmias: Secondary | ICD-10-CM | POA: Insufficient documentation

## 2013-09-04 DIAGNOSIS — I359 Nonrheumatic aortic valve disorder, unspecified: Secondary | ICD-10-CM | POA: Insufficient documentation

## 2013-09-04 DIAGNOSIS — J449 Chronic obstructive pulmonary disease, unspecified: Secondary | ICD-10-CM | POA: Insufficient documentation

## 2013-09-04 DIAGNOSIS — Z8673 Personal history of transient ischemic attack (TIA), and cerebral infarction without residual deficits: Secondary | ICD-10-CM | POA: Insufficient documentation

## 2013-09-04 NOTE — Progress Notes (Signed)
Echocardiogram performed.  

## 2013-09-06 ENCOUNTER — Encounter: Payer: Self-pay | Admitting: Gastroenterology

## 2013-09-06 ENCOUNTER — Ambulatory Visit (AMBULATORY_SURGERY_CENTER): Payer: Medicare Other | Admitting: Gastroenterology

## 2013-09-06 VITALS — BP 116/56 | HR 56 | Temp 96.4°F | Resp 8 | Ht 65.0 in | Wt 172.0 lb

## 2013-09-06 DIAGNOSIS — D126 Benign neoplasm of colon, unspecified: Secondary | ICD-10-CM

## 2013-09-06 DIAGNOSIS — Z86010 Personal history of colonic polyps: Secondary | ICD-10-CM

## 2013-09-06 DIAGNOSIS — K5730 Diverticulosis of large intestine without perforation or abscess without bleeding: Secondary | ICD-10-CM

## 2013-09-06 LAB — HM COLONOSCOPY

## 2013-09-06 LAB — GLUCOSE, CAPILLARY: Glucose-Capillary: 167 mg/dL — ABNORMAL HIGH (ref 70–99)

## 2013-09-06 MED ORDER — DEXTROSE 5 % IV SOLN: INTRAVENOUS | Status: DC

## 2013-09-06 MED ORDER — SODIUM CHLORIDE 0.9 % IV SOLN: 500.0000 mL | INTRAVENOUS | Status: DC

## 2013-09-06 NOTE — Patient Instructions (Addendum)
Restart Plavix in a.m. Per Dr. Arlyce Dice. You may resume all other medications today. Your blood sugar was 167 in the recovery room.  Handouts were given to your care partner on polyps, diverticulosis and a high fiber diet with liberal fluid intake. Please call if any questions or concerns.    YOU HAD AN ENDOSCOPIC PROCEDURE TODAY AT THE [facility]: Refer to the procedure report that was given to you for any specific questions about what was found during the examination.  If the procedure report does not answer your questions, please call your gastroenterologist to clarify.  If you requested that your care partner not be given the details of your procedure findings, then the procedure report has been included in a sealed envelope for you to review at your convenience later.  YOU SHOULD EXPECT: Some feelings of bloating in the abdomen. Passage of more gas than usual.  Walking can help get rid of the air that was put into your GI tract during the procedure and reduce the bloating. If you had a lower endoscopy (such as a colonoscopy or flexible sigmoidoscopy) you may notice spotting of blood in your stool or on the toilet paper. If you underwent a bowel prep for your procedure, then you may not have a normal bowel movement for a few days.  DIET: Your first meal following the procedure should be a light meal and then it is ok to progress to your normal diet.  A half-sandwich or bowl of soup is an example of a good first meal.  Heavy or fried foods are harder to digest and may make you feel nauseous or bloated.  Likewise meals heavy in dairy and vegetables can cause extra gas to form and this can also increase the bloating.  Drink plenty of fluids but you should avoid alcoholic beverages for 24 hours.  ACTIVITY: Your care partner should take you home directly after the procedure.  You should plan to take it easy, moving slowly for the rest of the day.  You can resume normal activity the day after  the procedure however you should NOT DRIVE or use heavy machinery for 24 hours (because of the sedation medicines used during the test).    SYMPTOMS TO REPORT IMMEDIATELY: A gastroenterologist can be reached at any hour.  During normal business hours, 8:30 AM to 5:00 PM Monday through Friday, call [phone].  After hours and on weekends, please call the GI answering service at [phone] who will take a message and have the physician on call contact you.   Following lower endoscopy (colonoscopy or flexible sigmoidoscopy):  Excessive amounts of blood in the stool  Significant tenderness or worsening of abdominal pains  Swelling of the abdomen that is new, acute  Fever of 100F or higher   FOLLOW UP: If any biopsies were taken you will be contacted by phone or by letter within the next 1-3 weeks.  Call your gastroenterologist if you have not heard about the biopsies in 3 weeks.  Our staff will call the home number listed on your records the next business day following your procedure to check on you and address any questions or concerns that you may have at that time regarding the information given to you following your procedure. This is a courtesy call and so if there is no answer at the home number and we have not heard from you through the emergency physician on call, we will assume that you have returned to your regular  daily activities without incident.  SIGNATURES/CONFIDENTIALITY: You and/or your care partner have signed paperwork which will be entered into your electronic medical record.  These signatures attest to the fact that that the information above on your After Visit Summary has been reviewed and is understood.  Full responsibility of the confidentiality of this discharge information lies with you and/or your care-partner.

## 2013-09-06 NOTE — Op Note (Signed)
[facility] [address] [place], [address]   COLONOSCOPY PROCEDURE REPORT  PATIENT: John [name], John [name]  MR#: [id number] BIRTHDATE: 1935-05-14 , 78  yrs. old GENDER: Male ENDOSCOPIST: Louis Meckel, MD REFERRED AO:[id number] Oneta Rack, M.D. PROCEDURE DATE:  09/06/2013 PROCEDURE:   Colonoscopy with snare polypectomy First Screening Colonoscopy - Avg.  risk and is 50 yrs.  old or older - No.      History of Adenoma - Now for follow-up colonoscopy & has been less than 3 years   Polyps Removed Today? Yes. ASA CLASS:   Class II INDICATIONS:Patient's personal history of colon polyps.  2013 - 18 polyps removed MEDICATIONS: MAC sedation, administered by CRNA and propofol (Diprivan) 100mg  IV  DESCRIPTION OF PROCEDURE:   After the risks benefits and alternatives of the procedure were thoroughly explained, informed consent was obtained.  A digital rectal exam revealed no abnormalities of the rectum.   The LB [id number]  endoscope was introduced through the anus and advanced to the cecum, which was identified by both the appendix and ileocecal valve. No adverse events experienced.   The quality of the prep was Suprep good  The instrument was then slowly withdrawn as the colon was fully examined.      COLON FINDINGS: A sessile polyp measuring 3 mm in size was found at the cecum.  A polypectomy was performed with a cold snare.  The resection was complete and the polyp tissue was completely retrieved.   There was mild scattered diverticulosis noted in the sigmoid colon, descending colon, and transverse colon.   The colon mucosa was otherwise normal.  Retroflexed views revealed no abnormalities. The time to cecum=7 minutes 36 seconds.  Withdrawal time=9 minutes 04 seconds.  The scope was withdrawn and the procedure completed. COMPLICATIONS: There were no complications.  ENDOSCOPIC IMPRESSION: 1.   Sessile polyp measuring 3 mm in size was found at the  cecum; polypectomy was performed with a cold snare 2.   There was mild diverticulosis noted in the sigmoid colon, descending colon, and transverse colon 3.   The colon mucosa was otherwise normal  RECOMMENDATIONS: colonoscopy 5 years  eSigned:  Louis Meckel, MD 09/06/2013 3:48 PM   cc:   PATIENT NAME:  John [name], John [name] MR#: [id number]

## 2013-09-06 NOTE — Progress Notes (Signed)
Called to room to assist during endoscopic procedure.  Patient ID and intended procedure confirmed with present staff. Received instructions for my participation in the procedure from the performing physician.  

## 2013-09-06 NOTE — Progress Notes (Addendum)
IV infused 500 cc D5W. IV replaced with NS To RR stable.

## 2013-09-06 NOTE — Progress Notes (Signed)
No complaints noted in the recovery room. Maw   

## 2013-09-07 ENCOUNTER — Telehealth: Payer: Self-pay

## 2013-09-07 NOTE — Telephone Encounter (Signed)
  Follow up Call-  Call back number 09/06/2013 09/12/2012 09/20/2011  Post procedure Call Back phone  # [phone] O.K TO LEAVE MESSAGE  Permission to leave phone message Yes Yes -     Patient questions:  Do you have a fever, pain , or abdominal swelling? no Pain Score  0 *  Have you tolerated food without any problems? yes  Have you been able to return to your normal activities? yes  Do you have any questions about your discharge instructions: Diet   no Medications  no Follow up visit  no  Do you have questions or concerns about your Care? no  Actions: * If pain score is 4 or above: No action needed, pain <4.

## 2013-09-18 ENCOUNTER — Encounter: Payer: Self-pay | Admitting: Gastroenterology

## 2013-09-27 ENCOUNTER — Other Ambulatory Visit: Payer: Self-pay | Admitting: Internal Medicine

## 2013-09-27 MED ORDER — SIMVASTATIN 80 MG PO TABS: 80.0000 mg | ORAL_TABLET | Freq: Every day | ORAL | Status: DC

## 2013-10-04 ENCOUNTER — Telehealth: Payer: Self-pay | Admitting: Internal Medicine

## 2013-10-04 ENCOUNTER — Other Ambulatory Visit: Payer: Self-pay | Admitting: Internal Medicine

## 2013-10-04 NOTE — Telephone Encounter (Signed)
Patient was experiencing severe confusion since starting Gabapentin Rx.  Per Dr. Oneta Rack patient was advised to d/c Rx 09/24/13 and follow up with results or any concerns.

## 2013-10-23 ENCOUNTER — Ambulatory Visit (INDEPENDENT_AMBULATORY_CARE_PROVIDER_SITE_OTHER): Payer: Medicare Other | Admitting: Internal Medicine

## 2013-10-23 ENCOUNTER — Encounter: Payer: Self-pay | Admitting: Internal Medicine

## 2013-10-23 VITALS — BP 126/66 | HR 76 | Temp 97.9°F | Resp 16 | Wt 168.0 lb

## 2013-10-23 DIAGNOSIS — E119 Type 2 diabetes mellitus without complications: Secondary | ICD-10-CM

## 2013-10-23 DIAGNOSIS — F418 Other specified anxiety disorders: Secondary | ICD-10-CM

## 2013-10-23 DIAGNOSIS — Z79899 Other long term (current) drug therapy: Secondary | ICD-10-CM

## 2013-10-23 DIAGNOSIS — F341 Dysthymic disorder: Secondary | ICD-10-CM

## 2013-10-23 DIAGNOSIS — E559 Vitamin D deficiency, unspecified: Secondary | ICD-10-CM

## 2013-10-23 DIAGNOSIS — I10 Essential (primary) hypertension: Secondary | ICD-10-CM

## 2013-10-23 DIAGNOSIS — R5381 Other malaise: Secondary | ICD-10-CM

## 2013-10-23 LAB — CBC WITH DIFFERENTIAL/PLATELET
Basophils Absolute: 0 10*3/uL (ref 0.0–0.1)
Basophils Relative: 0 % (ref 0–1)
HCT: 43.5 % (ref 39.0–52.0)
Lymphocytes Relative: 34 % (ref 12–46)
Lymphs Abs: 2.3 10*3/uL (ref 0.7–4.0)
MCV: 92.6 fL (ref 78.0–100.0)
Monocytes Absolute: 0.6 10*3/uL (ref 0.1–1.0)
Neutro Abs: 3.8 10*3/uL (ref 1.7–7.7)
Platelets: 205 10*3/uL (ref 150–400)
RBC: 4.7 MIL/uL (ref 4.22–5.81)
RDW: 14.3 % (ref 11.5–15.5)
WBC: 6.7 10*3/uL (ref 4.0–10.5)

## 2013-10-23 LAB — HEPATIC FUNCTION PANEL
Alkaline Phosphatase: 50 U/L (ref 39–117)
Bilirubin, Direct: 0.2 mg/dL (ref 0.0–0.3)
Indirect Bilirubin: 0.4 mg/dL (ref 0.0–0.9)
Total Bilirubin: 0.6 mg/dL (ref 0.3–1.2)

## 2013-10-23 LAB — BASIC METABOLIC PANEL WITH GFR
Chloride: 106 mEq/L (ref 96–112)
GFR, Est Non African American: 73 mL/min
Potassium: 5 mEq/L (ref 3.5–5.3)
Sodium: 143 mEq/L (ref 135–145)

## 2013-10-23 MED ORDER — AMPHETAMINE-DEXTROAMPHETAMINE 20 MG PO TABS: ORAL_TABLET | ORAL | Status: DC

## 2013-10-23 MED ORDER — BUPROPION HCL ER (XL) 150 MG PO TB24: 150.0000 mg | ORAL_TABLET | ORAL | Status: DC

## 2013-10-23 MED ORDER — OXAZEPAM 30 MG PO CAPS: 30.0000 mg | ORAL_CAPSULE | Freq: Every evening | ORAL | Status: DC | PRN

## 2013-10-23 NOTE — Patient Instructions (Signed)

## 2013-10-23 NOTE — Progress Notes (Signed)
Patient ID: John [name], male   DOB: 06-17-1935, 78 y.o.   MRN: [id number]   This very nice 78 y.o. MWM  presents for evaluation with Hypertension, Hyperlipidemia, Pre-Diabetes and Vitamin D Deficiency.     HTN predates since the 1960's. BP has been controlled at home. Today's BP: 126/66 mmHg. Patient also has ASCAD and CABG in 1986 with a Negative Cath in 2009.Patient denies any cardiac type chest pain, palpitations, dyspnea/orthopnea/PND, dizziness, claudication, or dependent edema.   Hyperlipidemia is controlled with diet & meds. Last Cholesterol was 152, Triglycerides were 97, HDL 50 and LDL 83 - all at goal. Patient denies myalgias or other med SE's.    Also, the patient has history of PreDiabetes predating to 1999 which improve after significant weight loss with last A1c of 5.7% in October. Patient denies any symptoms of reactive hypoglycemia, diabetic polys, paresthesias or visual blurring.   Further, Patient has history of Vitamin D Deficiency since 2008 with a level of 18 and with last vitamin D of 74 in Oct. Patient supplements vitamin D without any suspected side-effects.   Today's visit is prompted by discontinuing Gabapentin about 2 weeks ago as it was thought to be contributing to significant confusion which seeming has improved somewhat. But as a result, the patient's chronic insomnia has significantly worsened, which he feels is intolerable and is having much difficulty coping. He has been noted to have sporadic ST memory worse as well as mood changes. His daughter feels he is having more difficulty with balance and coordination.  Medication Sig Dispense Refill  . ALPRAZolam (XANAX) 1 MG tablet Take 1 mg by mouth at bedtime.       [name] aspirin 81 MG tablet Take 81 mg by mouth daily.        . Cholecalciferol (VITAMIN D3) 2000 UNITS capsule Take 2,000 Units by mouth daily.        . clopidogrel (PLAVIX) 75 MG tablet Take 75 mg by mouth daily.        [name] diltiazem (CARDIZEM CD) 120 MG  24 hr capsule Take 1 capsule (120 mg total) by mouth daily.  90 capsule  3  . glucose blood (FREESTYLE LITE) test strip 1 each by Other route as needed for other. Use as instructed      . Lancets (FREESTYLE) lancets 1 each by Other route as needed for other. Use as instructed      . LOTEMAX 0.5 % GEL Apply to eye as needed.       . nitroGLYCERIN (NITROSTAT) 0.4 MG SL tablet Place 0.4 mg under the tongue every 5 (five) minutes as needed.        . pantoprazole (PROTONIX) 40 MG tablet Take 40 mg by mouth 2 (two) times daily.      . potassium citrate (UROCIT-K) 10 MEQ (1080 MG) SR tablet Take 10 mEq by mouth 2 (two) times daily.        . promethazine (PHENERGAN) 25 MG tablet Take 25 mg by mouth as needed.          Allergies  Allergen Reactions  . Beta Adrenergic Blockers     REACTION: Bradycardia  . Gabapentin     Severe confusion  . Prozac [Fluoxetine Hcl]   . Soma [Carisoprodol]     PMHx:   Past Medical History  Diagnosis Date  . TIA   . SYNCOPE   . HYPERTENSION   . HYPERLIPIDEMIA   . CAD, ARTERY BYPASS GRAFT   . TRANSIENT ISCHEMIC  ATTACKS, HX OF   . SUPRAVENTRICULAR TACHYCARDIA, HX OF   . NEPHROLITHIASIS, HX OF   . Leukodystrophy   . GASTROESOPHAGEAL REFLUX DISEASE   . DIABETES MELLITUS, TYPE II   . ASTHMA   . Diverticulosis   . Hiatal hernia   . Insomnia   . Anxiety   . Arthritis     LEFT ANKLE  . Blood transfusion   . COPD     PT. DENIES  . Stroke     MINI 4/5 YEARS AGO  . Ulcer   . Colon polyps     adenomatous polyps    FHx:    Reviewed / unchanged  SHx:    Reviewed / unchanged  Systems Review: Constitutional: Denies fever, chills, wt changes, headaches, insomnia, fatigue, night sweats, change in appetite. Eyes: Denies redness, blurred vision, diplopia, discharge, itchy, watery eyes.  ENT: Denies discharge, congestion, post nasal drip, epistaxis, sore throat, earache, hearing loss, dental pain, tinnitus, vertigo, sinus pain, snoring.  CV: Denies chest  pain, palpitations, irregular heartbeat, syncope, dyspnea, diaphoresis, orthopnea, PND, claudication, edema. Respiratory: denies cough, dyspnea, DOE, pleurisy, hoarseness, laryngitis, wheezing.  Gastrointestinal: Denies dysphagia, odynophagia, heartburn, reflux, water brash, abdominal pain or cramps, nausea, vomiting, bloating, diarrhea, constipation, hematemesis, melena, hematochezia,  or hemorrhoids. Genitourinary: Denies dysuria, frequency, urgency, nocturia, hesitancy, discharge, hematuria, flank pain. Musculoskeletal: Denies arthralgias, myalgias, stiffness, jt. swelling, pain, limp, strain/sprain.  Skin: Denies pruritus, rash, hives, warts, acne, eczema, change in skin lesion(s). Neuro: No weakness, tremor, spasms, paresthesia, or pain. Psychiatric: Possible confusion, memory loss. Endo: Denies change in weight, skin, hair change.  Heme/Lymph: No excessive bleeding, bruising, orenlarged lymph nodes.  Filed Vitals:   10/23/13 1556  BP: 126/66  Pulse: 76  Temp: 97.9 F (36.6 C)  Resp: 16    Estimated body mass index is 27.96 kg/(m^2) as calculated from the following:   Height as of 09/06/13: 5\' 5"  (1.651 m).   Weight as of this encounter: 168 lb (76.204 kg).  On Exam: Appears well nourished - in no distress. Eyes: PERRLA, EOMs, conjunctiva no swelling or erythema. Sinuses: No frontal/maxillary tenderness ENT/Mouth: EAC's clear, TM's nl w/o erythema, bulging. Nares clear w/o erythema, swelling, exudates. Oropharynx clear without erythema or exudates. Oral hygiene is good. Tongue normal, non obstructing. Hearing intact.  Neck: Supple. Thyroid nl. Car 2+/2+ without bruits, nodes or JVD. Chest: Respirations nl with BS clear & equal w/o rales, rhonchi, wheezing or stridor.  Cor: Heart sounds normal w/ regular rate and rhythm without sig. murmurs, gallops, clicks, or rubs. Peripheral pulses normal and equal  without edema.  Abdomen: Soft & bowel sounds normal. Non-tender w/o guarding,  rebound, hernias, masses, or organomegaly.  Lymphatics: Unremarkable.  Musculoskeletal: Full ROM all peripheral extremities, joint stability, 5/5 strength, and gait is low and slightly broad-based.  Skin: Warm, dry without exposed rashes, lesions, ecchymosis apparent.  Neuro: Cranial nerves intact and no focal neuro abnormalities are evident. Speech is fluent and appropriate.  Pysch: Alert & oriented x 3. Insight and judgement  seem appropriate. No ideations.  Assessment and Plan:  1. Hypertension - Continue monitor blood pressure at home. Continue diet/meds same.  2. Hyperlipidemia - Continue diet/meds, exercise,& lifestyle modifications. Continue monitor periodic cholesterol/liver & renal functions   3. Pre-diabetes/Insulin Resistance - Continue diet, exercise, lifestyle modifications. Monitor appropriate labs.  4. Vitamin D Deficiency - Continue supplementation.  5. Insomnia and mood changes - will try oxazepam in lieu of alprazolam for its longer half-life in order to sustain a  longer sleep cycle. Will also start low dose Bupropion 150 mg XL and Adderall 20 mg at 1/2 tab qam for mental stimulation. Discussed with patient & his daughter that he needs to rely on a proctor for any major decisions. Also discussed consider obtain brain MRI if not improve to r/o CVA, tumor , etc.  Recommended regular exercise, BP monitoring, weight control, and discussed med and SE's. Recommended labs to assess and monitor clinical status. Further disposition pending results of labs.

## 2013-10-24 ENCOUNTER — Ambulatory Visit: Payer: Self-pay | Admitting: Internal Medicine

## 2013-10-24 LAB — TSH: TSH: 2.487 u[IU]/mL (ref 0.350–4.500)

## 2013-10-24 LAB — INSULIN, FASTING: Insulin fasting, serum: 12 u[IU]/mL (ref 3–28)

## 2013-10-24 LAB — HEMOGLOBIN A1C
Hgb A1c MFr Bld: 5.6 % (ref <5.7)
Mean Plasma Glucose: 114 mg/dL (ref <117)

## 2013-11-08 ENCOUNTER — Ambulatory Visit: Payer: Self-pay | Admitting: Physician Assistant

## 2013-11-08 ENCOUNTER — Encounter: Payer: Self-pay | Admitting: Internal Medicine

## 2013-11-08 ENCOUNTER — Ambulatory Visit (INDEPENDENT_AMBULATORY_CARE_PROVIDER_SITE_OTHER): Payer: Medicare Other | Admitting: Internal Medicine

## 2013-11-08 VITALS — BP 128/70 | HR 72 | Temp 98.2°F | Resp 18 | Wt 164.6 lb

## 2013-11-08 DIAGNOSIS — I2510 Atherosclerotic heart disease of native coronary artery without angina pectoris: Secondary | ICD-10-CM

## 2013-11-08 DIAGNOSIS — F5105 Insomnia due to other mental disorder: Secondary | ICD-10-CM

## 2013-11-08 DIAGNOSIS — F419 Anxiety disorder, unspecified: Secondary | ICD-10-CM

## 2013-11-08 DIAGNOSIS — I10 Essential (primary) hypertension: Secondary | ICD-10-CM

## 2013-11-08 MED ORDER — NITROGLYCERIN 0.4 MG SL SUBL: SUBLINGUAL_TABLET | SUBLINGUAL | Status: DC

## 2013-11-08 MED ORDER — QUETIAPINE FUMARATE 25 MG PO TABS: 25.0000 mg | ORAL_TABLET | Freq: Three times a day (TID) | ORAL | Status: DC

## 2013-11-08 NOTE — Patient Instructions (Signed)

## 2013-11-08 NOTE — Progress Notes (Signed)
Patient ID: John [name], male   DOB: 08-20-1935, 79 y.o.   MRN: [id number]   This very nice 79 y.o. MWM presents for follow up of medicine changes and has Hx/o Hypertension, Hyperlipidemia, Pre-Diabetes and Vitamin D Deficiency.    Patient returns for f/u of med changes and having added Seroquel and titrated with Alprazolam to now having finally achieved 6 hours sleep last night and being very happy with that and feeling very rested and able to think better.    Medication List       ALPRAZolam 1 MG tablet  Commonly known as:  XANAX  Take 1 mg by mouth at bedtime.     aspirin 81 MG tablet  Take 81 mg by mouth daily.     clopidogrel 75 MG tablet  Commonly known as:  PLAVIX  Take 75 mg by mouth daily.     diltiazem 120 MG 24 hr capsule  Commonly known as:  CARDIZEM CD  Take 1 capsule (120 mg total) by mouth daily.     freestyle lancets  1 each by Other route as needed for other. Use as instructed     FREESTYLE LITE test strip  Generic drug:  glucose blood  1 each by Other route as needed for other. Use as instructed     LOTEMAX 0.5 % Gel  Generic drug:  Loteprednol Etabonate  Apply to eye as needed.     nitroGLYCERIN 0.4 MG SL tablet  Commonly known as:  NITROSTAT  Place 0.4 mg under the tongue every 5 (five) minutes as needed.     pantoprazole 40 MG tablet  Commonly known as:  PROTONIX  Take 40 mg by mouth 1 day or 1 dose.     potassium citrate 10 MEQ (1080 MG) SR tablet  Commonly known as:  UROCIT-K  Take 10 mEq by mouth 1 day or 1 dose.     promethazine 25 MG tablet  Commonly known as:  PHENERGAN  Take 25 mg by mouth as needed.     QUEtiapine 25 MG tablet  Commonly known as:  SEROQUEL  Take 25 mg by mouth at bedtime.     simvastatin 80 MG tablet  Commonly known as:  ZOCOR  Take 80 mg by mouth at bedtime. Takes 1/2 tab=40mg      SUPREP BOWEL PREP Soln  Generic drug:  Na Sulfate-K Sulfate-Mg Sulf     Vitamin D3 2000 UNITS capsule  Take 2,000 Units by  mouth daily.         Allergies  Allergen Reactions  . Beta Adrenergic Blockers     REACTION: Bradycardia  . Gabapentin     Severe confusion  . Prozac [Fluoxetine Hcl]   . Soma [Carisoprodol]     PMHx:   Past Medical History  Diagnosis Date  . TIA   . SYNCOPE   . HYPERTENSION   . HYPERLIPIDEMIA   . CAD, ARTERY BYPASS GRAFT   . TRANSIENT ISCHEMIC ATTACKS, HX OF   . SUPRAVENTRICULAR TACHYCARDIA, HX OF   . NEPHROLITHIASIS, HX OF   . Leukodystrophy   . GASTROESOPHAGEAL REFLUX DISEASE   . DIABETES MELLITUS, TYPE II   . ASTHMA   . Diverticulosis   . Hiatal hernia   . Insomnia   . Anxiety   . Arthritis     LEFT ANKLE  . Blood transfusion   . COPD     PT. DENIES  . Stroke     MINI 4/5 YEARS AGO  . Ulcer   .  Colon polyps     adenomatous polyps    FHx:    Reviewed / unchanged  SHx:    Reviewed / unchanged  Systems Review: Constitutional: Denies fever, chills, wt changes, headaches, insomnia, fatigue, night sweats, change in appetite. Eyes: Denies redness, blurred vision, diplopia, discharge, itchy, watery eyes.  ENT: Denies discharge, congestion, post nasal drip, epistaxis, sore throat, earache, hearing loss, dental pain, tinnitus, vertigo, sinus pain, snoring.  CV: Denies chest pain, palpitations, irregular heartbeat, syncope, dyspnea, diaphoresis, orthopnea, PND, claudication, edema. Respiratory: denies cough, dyspnea, DOE, pleurisy, hoarseness, laryngitis, wheezing.  Gastrointestinal: Denies dysphagia, odynophagia, heartburn, reflux, water brash, abdominal pain or cramps, nausea, vomiting, bloating, diarrhea, constipation, hematemesis, melena, hematochezia,  or hemorrhoids. Genitourinary: Denies dysuria, frequency, urgency, nocturia, hesitancy, discharge, hematuria, flank pain. Musculoskeletal: Denies arthralgias, myalgias, stiffness, jt. swelling, pain, limp, strain/sprain.  Skin: Denies pruritus, rash, hives, warts, acne, eczema, change in skin lesion(s). Neuro:  No weakness, tremor, incoordination, spasms, paresthesia, or pain. Psychiatric: Denies confusion, memory loss, or sensory loss. Endo: Denies change in weight, skin, hair change.  Heme/Lymph: No excessive bleeding, bruising, orenlarged lymph nodes.  BP: 128/70  Pulse: 72  Temp: 98.2 F (36.8 C)  Resp: 18    Estimated body mass index is 27.39 kg/(m^2) as calculated from the following:   Height as of 09/06/13: 5\' 5"  (1.651 m).   Weight as of this encounter: 164 lb 9.6 oz (74.662 kg).  On Exam: Appears well nourished - in no distress. Eyes: PERRLA, EOMs, conjunctiva no swelling or erythema. Sinuses: No frontal/maxillary tenderness ENT/Mouth: EAC's clear, TM's nl w/o erythema, bulging. Nares clear w/o erythema, swelling, exudates. Oropharynx clear without erythema or exudates. Oral hygiene is good. Tongue normal, non obstructing. Hearing intact.  Neck: Supple. Thyroid nl. Car 2+/2+ without bruits, nodes or JVD. Chest: Respirations nl with BS clear & equal w/o rales, rhonchi, wheezing or stridor.  Cor: Heart sounds normal w/ regular rate and rhythm without sig. murmurs, gallops, clicks, or rubs. Peripheral pulses normal and equal  without edema.  Abdomen: Soft & bowel sounds normal. Non-tender w/o guarding, rebound, hernias, masses, or organomegaly.  Lymphatics: Unremarkable.  Musculoskeletal: Full ROM all peripheral extremities, joint stability, 5/5 strength, and normal gait.  Skin: Warm, dry without exposed rashes, lesions, ecchymosis apparent.  Neuro: Cranial nerves intact, reflexes equal bilaterally. Sensory-motor testing grossly intact. Tendon reflexes grossly intact.  Pysch: Alert & oriented x 3. Insight and judgement nl & appropriate. No ideations.  Assessment and Plan:  1. Hypertension - Continue monitor blood pressure at home. Continue diet/meds same.  2. Insomnia due to Anxiety  3. Hyperlipidemia   4. Pre-diabetes  5. Vitamin D Deficiency  Recommended regular exercise,  BP monitoring, weight control, and discussed med and SE's.

## 2014-01-18 ENCOUNTER — Other Ambulatory Visit: Payer: Self-pay | Admitting: *Deleted

## 2014-01-18 MED ORDER — POTASSIUM CITRATE ER 10 MEQ (1080 MG) PO TBCR: 10.0000 meq | EXTENDED_RELEASE_TABLET | ORAL | Status: DC

## 2014-01-18 MED ORDER — PANTOPRAZOLE SODIUM 40 MG PO TBEC: 40.0000 mg | DELAYED_RELEASE_TABLET | Freq: Two times a day (BID) | ORAL | Status: DC

## 2014-01-21 ENCOUNTER — Other Ambulatory Visit: Payer: Self-pay | Admitting: Internal Medicine

## 2014-01-21 MED ORDER — POTASSIUM CITRATE ER 10 MEQ (1080 MG) PO TBCR: EXTENDED_RELEASE_TABLET | ORAL | Status: DC

## 2014-02-06 ENCOUNTER — Ambulatory Visit (INDEPENDENT_AMBULATORY_CARE_PROVIDER_SITE_OTHER): Payer: Medicare Other | Admitting: Internal Medicine

## 2014-02-06 ENCOUNTER — Encounter: Payer: Self-pay | Admitting: Internal Medicine

## 2014-02-06 VITALS — BP 106/60 | HR 64 | Temp 98.2°F | Resp 16 | Ht 71.5 in | Wt 169.0 lb

## 2014-02-06 DIAGNOSIS — Z79899 Other long term (current) drug therapy: Secondary | ICD-10-CM

## 2014-02-06 DIAGNOSIS — I10 Essential (primary) hypertension: Secondary | ICD-10-CM

## 2014-02-06 DIAGNOSIS — E1129 Type 2 diabetes mellitus with other diabetic kidney complication: Secondary | ICD-10-CM

## 2014-02-06 DIAGNOSIS — Z515 Encounter for palliative care: Secondary | ICD-10-CM | POA: Insufficient documentation

## 2014-02-06 DIAGNOSIS — E559 Vitamin D deficiency, unspecified: Secondary | ICD-10-CM

## 2014-02-06 DIAGNOSIS — E785 Hyperlipidemia, unspecified: Secondary | ICD-10-CM

## 2014-02-06 LAB — CBC WITH DIFFERENTIAL/PLATELET
BASOS ABS: 0 10*3/uL (ref 0.0–0.1)
Basophils Relative: 0 % (ref 0–1)
Eosinophils Absolute: 0.2 10*3/uL (ref 0.0–0.7)
Eosinophils Relative: 3 % (ref 0–5)
HEMATOCRIT: 43.2 % (ref 39.0–52.0)
Hemoglobin: 15.2 g/dL (ref 13.0–17.0)
LYMPHS ABS: 1.9 10*3/uL (ref 0.7–4.0)
Lymphocytes Relative: 36 % (ref 12–46)
MCH: 31.9 pg (ref 26.0–34.0)
MCHC: 35.2 g/dL (ref 30.0–36.0)
MCV: 90.6 fL (ref 78.0–100.0)
Monocytes Absolute: 0.5 10*3/uL (ref 0.1–1.0)
Monocytes Relative: 9 % (ref 3–12)
NEUTROS ABS: 2.8 10*3/uL (ref 1.7–7.7)
Neutrophils Relative %: 52 % (ref 43–77)
PLATELETS: 187 10*3/uL (ref 150–400)
RBC: 4.77 MIL/uL (ref 4.22–5.81)
RDW: 13.7 % (ref 11.5–15.5)
WBC: 5.3 10*3/uL (ref 4.0–10.5)

## 2014-02-06 NOTE — Patient Instructions (Signed)

## 2014-02-06 NOTE — Progress Notes (Signed)
Patient ID: John [name], male   DOB: 1935-06-26, 79 y.o.   MRN: [id number]    This very nice 79 y.o. MWM presents for 3 month follow up with Hypertension, Hyperlipidemia, Pre-Diabetes and Vitamin D Deficiency.    HTN predates since the 1960's, but sometims he has had periods of limiting postural orthostasis. BP has been controlled at home. Today's BP: 106/60 mmHg. Patient had a CABG in 1986 and a negative cath in 2009 finding patent grafts. In 1995, he had a TIA with no residuae. Currently he is followed by Dr Achille Rich denies any cardiac type chest pain, palpitations, dyspnea/orthopnea/PND, dizziness, claudication, or dependent edema.   Hyperlipidemia is controlled with diet & meds. Last Cholesterol was  152, Triglycerides were  97, HDL 50 and LDL 83 in Oct 2014. Patient denies myalgias or other med SE's.    Also, the patient has history of T2 NIDDM since 1999 and is now diet controlled with weight loss and with last A1c was 5.7% in Oct 2014. Patient denies any symptoms of reactive hypoglycemia, diabetic polys, paresthesias or visual blurring.   Further, Patient has history of Vitamin D Deficiency of 18 in 2008 with last vitamin D of 74. Patient supplements vitamin D without any suspected side-effects.  Medication Sig  . ALPRAZolam (XANAX) 1 MG tablet Take 1 mg by mouth at bedtime.   [name] aspirin 81 MG tablet Take 81 mg by mouth daily.    . Cholecalciferol (VITAMIN D3) 2000 UNITS capsule Take 2,000 Units by mouth daily.    . clopidogrel (PLAVIX) 75 MG tablet Take 75 mg by mouth daily.    [name] diltiazem (CARDIZEM CD) 120 MG 24 hr capsule Take 1 capsule (120 mg total) by mouth daily.  [name] glucose blood (FREESTYLE LITE) test strip 1 each by Other route as needed for other. Use as instructed  . Lancets (FREESTYLE) lancets 1 each by Other route as needed for other. Use as instructed  . LOTEMAX 0.5 % GEL Apply to eye as needed.   . nitroGLYCERIN (NITROSTAT) 0.4 MG SL tablet Sig: 1 tablet under  tongue every 3 to 5 minutes as needed for Angina -   . pantoprazole (PROTONIX) 40 MG tablet Take 1 tablet (40 mg total) by mouth 2 (two) times daily.  . potassium citrate (UROCIT-K) 10 MEQ  SR  Take 1 tablet (10 meq) 2 x daily  . promethazine (PHENERGAN) 25 MG tablet Take 25 mg by mouth as needed.   [name] QUEtiapine (SEROQUEL) 25 MG tablet Take 1 tablet (25 mg total) by mouth 3 (three) times daily.  . simvastatin (ZOCOR) 80 MG tablet Take 80 mg by mouth at bedtime. Takes 1/2 tab=40mg   . SUPREP BOWEL PREP SOLN    Allergies  Allergen Reactions  . Beta Adrenergic Blockers     REACTION: Bradycardia  . Gabapentin     Severe confusion  . Prozac [Fluoxetine Hcl]   . Soma [Carisoprodol]     PMHx:   Past Medical History  Diagnosis Date  . TIA   . SYNCOPE   . HYPERTENSION   . HYPERLIPIDEMIA   . CAD, ARTERY BYPASS GRAFT   . TRANSIENT ISCHEMIC ATTACKS, HX OF   . SUPRAVENTRICULAR TACHYCARDIA, HX OF   . NEPHROLITHIASIS, HX OF   . Leukodystrophy   . GASTROESOPHAGEAL REFLUX DISEASE   . DIABETES MELLITUS, TYPE II   . ASTHMA   . Diverticulosis   . Hiatal hernia   . Insomnia   . Anxiety   . Arthritis  LEFT ANKLE  . Blood transfusion   . COPD     PT. DENIES  . Stroke     MINI 4/5 YEARS AGO  . Ulcer   . Colon polyps     adenomatous polyps   FHx:    Reviewed / unchanged  SHx:    Reviewed / unchanged   Systems Review: Constitutional: Denies fever, chills, wt changes, headaches, insomnia, fatigue, night sweats, change in appetite. Eyes: Denies redness, blurred vision, diplopia, discharge, itchy, watery eyes.  ENT: Denies discharge, congestion, post nasal drip, epistaxis, sore throat, earache, hearing loss, dental pain, tinnitus, vertigo, sinus pain, snoring.  CV: Denies chest pain, palpitations, irregular heartbeat, syncope, dyspnea, diaphoresis, orthopnea, PND, claudication, edema. Respiratory: denies cough, dyspnea, DOE, pleurisy, hoarseness, laryngitis, wheezing.   Gastrointestinal: Denies dysphagia, odynophagia, heartburn, reflux, water brash, abdominal pain or cramps, nausea, vomiting, bloating, diarrhea, constipation, hematemesis, melena, hematochezia,  or hemorrhoids. Genitourinary: Denies dysuria, frequency, urgency, nocturia, hesitancy, discharge, hematuria, flank pain. Musculoskeletal: Denies arthralgias, myalgias, stiffness, jt. swelling, pain, limp, strain/sprain.  Skin: Denies pruritus, rash, hives, warts, acne, eczema, change in skin lesion(s). Neuro: No weakness, tremor, incoordination, spasms, paresthesia, or pain. Psychiatric: Denies confusion, memory loss, or sensory loss. Endo: Denies change in weight, skin, hair change.  Heme/Lymph: No excessive bleeding, bruising, orenlarged lymph nodes.   Exam:  BP 106/60  Pulse 64  Temp 98.2 F   Resp 16  Ht 5' 11.5"   Wt 169 lb   BMI 23.24 kg/m2  Appears well nourished - in no distress. Eyes: PERRLA, EOMs, conjunctiva no swelling or erythema. Sinuses: No frontal/maxillary tenderness ENT/Mouth: EAC's clear, TM's nl w/o erythema, bulging. Nares clear w/o erythema, swelling, exudates. Oropharynx clear without erythema or exudates. Oral hygiene is good. Tongue normal, non obstructing. Hearing intact.  Neck: Supple. Thyroid nl. Car 2+/2+ without bruits, nodes or JVD. Chest: Respirations nl with BS clear & equal w/o rales, rhonchi, wheezing or stridor.  Cor: Heart sounds normal w/ regular rate and rhythm without sig. murmurs, gallops, clicks, or rubs. Peripheral pulses normal and equal  without edema.  Abdomen: Soft & bowel sounds normal. Non-tender w/o guarding, rebound, hernias, masses, or organomegaly.  Lymphatics: Unremarkable.  Musculoskeletal: Full ROM all peripheral extremities, joint stability, 5/5 strength, and normal gait.  Skin: Warm, dry without exposed rashes, lesions, ecchymosis apparent.  Neuro: Cranial nerves intact, reflexes equal bilaterally. Sensory-motor testing grossly  intact. Tendon reflexes grossly intact.  Pysch: Alert & oriented x 3. Insight and judgement nl & appropriate. No ideations.  Assessment and Plan:  1. Hypertension - Continue monitor blood pressure at home. Continue diet/meds same.  2. Hyperlipidemia - Continue diet/meds, exercise,& lifestyle modifications. Continue monitor periodic cholesterol/liver & renal functions   3. T2 NIDDM - continue recommend prudent low glycemic diet, weight control, regular exercise, diabetic monitoring and periodic eye exams.  4. Vitamin D Deficiency - Continue supplementation.  5. ASCAD/CABG - stable  Recommended regular exercise, BP monitoring, weight control, and discussed med and SE's. Recommended labs to assess and monitor clinical status. Further disposition pending results of labs.

## 2014-02-07 LAB — BASIC METABOLIC PANEL WITH GFR
BUN: 24 mg/dL — ABNORMAL HIGH (ref 6–23)
CO2: 28 mEq/L (ref 19–32)
Calcium: 9.8 mg/dL (ref 8.4–10.5)
Chloride: 103 mEq/L (ref 96–112)
Creat: 1.34 mg/dL (ref 0.50–1.35)
GFR, EST NON AFRICAN AMERICAN: 51 mL/min — AB
GFR, Est African American: 59 mL/min — ABNORMAL LOW
Glucose, Bld: 90 mg/dL (ref 70–99)
POTASSIUM: 5 meq/L (ref 3.5–5.3)
SODIUM: 140 meq/L (ref 135–145)

## 2014-02-07 LAB — HEPATIC FUNCTION PANEL
ALBUMIN: 4.4 g/dL (ref 3.5–5.2)
ALT: 14 U/L (ref 0–53)
AST: 14 U/L (ref 0–37)
Alkaline Phosphatase: 53 U/L (ref 39–117)
Bilirubin, Direct: 0.2 mg/dL (ref 0.0–0.3)
Indirect Bilirubin: 0.5 mg/dL (ref 0.2–1.2)
TOTAL PROTEIN: 6.9 g/dL (ref 6.0–8.3)
Total Bilirubin: 0.7 mg/dL (ref 0.2–1.2)

## 2014-02-07 LAB — TSH: TSH: 1.004 u[IU]/mL (ref 0.350–4.500)

## 2014-02-07 LAB — INSULIN, FASTING: INSULIN FASTING, SERUM: 16 u[IU]/mL (ref 3–28)

## 2014-02-07 LAB — VITAMIN D 25 HYDROXY (VIT D DEFICIENCY, FRACTURES): Vit D, 25-Hydroxy: 59 ng/mL (ref 30–89)

## 2014-02-07 LAB — HEMOGLOBIN A1C
HEMOGLOBIN A1C: 5.9 % — AB (ref <5.7)
MEAN PLASMA GLUCOSE: 123 mg/dL — AB (ref <117)

## 2014-02-07 LAB — LIPID PANEL
Cholesterol: 147 mg/dL (ref 0–200)
HDL: 57 mg/dL (ref >39)
LDL CALC: 74 mg/dL (ref 0–99)
Total CHOL/HDL Ratio: 2.6 Ratio
Triglycerides: 80 mg/dL (ref <150)
VLDL: 16 mg/dL (ref 0–40)

## 2014-02-07 LAB — MAGNESIUM: Magnesium: 2 mg/dL (ref 1.5–2.5)

## 2014-02-25 ENCOUNTER — Telehealth: Payer: Self-pay | Admitting: *Deleted

## 2014-02-25 NOTE — Telephone Encounter (Signed)
Dina Rich aware of labs and faxed to patient.

## 2014-03-04 ENCOUNTER — Other Ambulatory Visit: Payer: Self-pay

## 2014-03-04 ENCOUNTER — Encounter: Payer: Self-pay | Admitting: Cardiovascular Disease

## 2014-03-04 ENCOUNTER — Ambulatory Visit (INDEPENDENT_AMBULATORY_CARE_PROVIDER_SITE_OTHER): Payer: Medicare Other | Admitting: Cardiovascular Disease

## 2014-03-04 VITALS — BP 107/69 | HR 72 | Ht 71.0 in | Wt 172.8 lb

## 2014-03-04 DIAGNOSIS — R5383 Other fatigue: Secondary | ICD-10-CM

## 2014-03-04 DIAGNOSIS — I779 Disorder of arteries and arterioles, unspecified: Secondary | ICD-10-CM

## 2014-03-04 DIAGNOSIS — R5381 Other malaise: Secondary | ICD-10-CM

## 2014-03-04 DIAGNOSIS — I739 Peripheral vascular disease, unspecified: Secondary | ICD-10-CM

## 2014-03-04 DIAGNOSIS — R197 Diarrhea, unspecified: Secondary | ICD-10-CM

## 2014-03-04 DIAGNOSIS — I4949 Other premature depolarization: Secondary | ICD-10-CM

## 2014-03-04 DIAGNOSIS — E785 Hyperlipidemia, unspecified: Secondary | ICD-10-CM

## 2014-03-04 DIAGNOSIS — I493 Ventricular premature depolarization: Secondary | ICD-10-CM

## 2014-03-04 DIAGNOSIS — I2510 Atherosclerotic heart disease of native coronary artery without angina pectoris: Secondary | ICD-10-CM

## 2014-03-04 NOTE — Patient Instructions (Signed)
Your physician wants you to follow-up in: 6 months.   You will receive a reminder letter in the mail two months in advance. If you don't receive a letter, please call our office to schedule the follow-up appointment.  Your physician has requested that you have a lexiscan myoview. For further information please visit [email or web address]. Please follow instruction sheet, as given.   

## 2014-03-04 NOTE — Progress Notes (Signed)
History of Present Illness: 79 yo male with history of CAD s/p 2 V CABG 1996 , DM, HLD, TIA here today for cardiac follow up. He has been followed in the past by Dr. Lia Foyer. Last cath 2002 with patent LIMA to LAD. He is on Plavix for stroke prevention per primary care. Echo November 2014 with normal LV systolic function, mild AI. Carotid dopplers November 2014 with mild bilateral disease.   He has been doing well. No chest pain or SOB. He does note fatigue. No recent stress testing. No near syncope or syncope.   Primary Care Physician: Melford Aase  Last Lipid Profile: Followed in primary care.    Past Medical History  Diagnosis Date  . TIA   . SYNCOPE   . HYPERTENSION   . HYPERLIPIDEMIA   . CAD, ARTERY BYPASS GRAFT   . TRANSIENT ISCHEMIC ATTACKS, HX OF   . SUPRAVENTRICULAR TACHYCARDIA, HX OF   . NEPHROLITHIASIS, HX OF   . Leukodystrophy   . GASTROESOPHAGEAL REFLUX DISEASE   . DIABETES MELLITUS, TYPE II   . ASTHMA   . Diverticulosis   . Hiatal hernia   . Insomnia   . Anxiety   . Arthritis     LEFT ANKLE  . Blood transfusion   . COPD     PT. DENIES  . Stroke     MINI 4/5 YEARS AGO  . Ulcer   . Colon polyps     adenomatous polyps    Past Surgical History  Procedure Laterality Date  . Coronary artery bypass graft  1996  . Cardiac catheterization    . Gastrectomy    . Vagotomy    . Cystourethroscopy with stent removal.    . Sebaceous cyst removal    . Colonoscopy    . Upper gastrointestinal endoscopy    . Hemorrhoid surgery    . Polypectomy      Current Outpatient Prescriptions  Medication Sig Dispense Refill  . ALPRAZolam (XANAX) 1 MG tablet Take 1 mg by mouth at bedtime.       [name] aspirin 81 MG tablet Take 81 mg by mouth daily.        . Cholecalciferol (VITAMIN D3) 2000 UNITS capsule Take 2,000 Units by mouth daily.        . clopidogrel (PLAVIX) 75 MG tablet Take 75 mg by mouth daily.        [name] diltiazem (CARDIZEM CD) 120 MG 24 hr capsule Take 1 capsule (120 mg  total) by mouth daily.  90 capsule  3  . glucose blood (FREESTYLE LITE) test strip 1 each by Other route as needed for other. Use as instructed      . Lancets (FREESTYLE) lancets 1 each by Other route as needed for other. Use as instructed      . LOTEMAX 0.5 % GEL Apply to eye as needed.       . nitroGLYCERIN (NITROSTAT) 0.4 MG SL tablet Sig: 1 tablet under tongue every 3 to 5 minutes as needed for Angina - Please Dispense # 2 bottles of # 25 tabs  50 tablet  99  . pantoprazole (PROTONIX) 40 MG tablet Take 1 tablet (40 mg total) by mouth 2 (two) times daily.  180 tablet  4  . potassium citrate (UROCIT-K) 10 MEQ (1080 MG) SR tablet Take 1 tablet (10 meq) 2 x daily  180 tablet  99  . promethazine (PHENERGAN) 25 MG tablet Take 25 mg by mouth as needed.       [name]  QUEtiapine (SEROQUEL) 25 MG tablet Take 1 tablet (25 mg total) by mouth 3 (three) times daily.  90 tablet  99  . simvastatin (ZOCOR) 80 MG tablet Take 80 mg by mouth at bedtime. Takes 1/2 tab=40mg       . SUPREP BOWEL PREP SOLN        No current facility-administered medications for this visit.    Allergies  Allergen Reactions  . Beta Adrenergic Blockers     REACTION: Bradycardia  . Gabapentin     Severe confusion  . Prozac [Fluoxetine Hcl]   . Soma [Carisoprodol]     History   Social History  . Marital Status: Married    Spouse Name: N/A    Number of Children: 2  . Years of Education: N/A   Occupational History  . 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 Smoking status: Former Smoker    Types: Cigarettes    Quit date: 10/25/1962  . Smokeless tobacco: Former Systems developer    Quit date: 10/25/1962  . Alcohol Use: No  . Drug Use: No  . Sexual Activity: Not on file   Other Topics Concern  . Not on file   Social History Narrative  . No narrative on file    Family History  Problem Relation Age of Onset  . Heart disease Brother   . Diabetes Brother   . Alcohol abuse Father   . Heart disease  Paternal Grandmother   . Colon cancer Neg Hx   . Stomach cancer Neg Hx     Review of Systems:  As stated in the HPI and otherwise negative.   BP 107/69  Pulse 72  Ht 5\' 11"  (1.803 m)  Wt 172 lb 12.8 oz (78.382 kg)  BMI 24.11 kg/m2  Physical Examination: General: Well developed, well nourished, NAD HEENT: OP clear, mucus membranes moist SKIN: warm, dry. No rashes. Neuro: No focal deficits Musculoskeletal: Muscle strength 5/5 all ext Psychiatric: Mood and affect normal Neck: No JVD, no carotid bruits, no thyromegaly, no lymphadenopathy. Lungs:Clear bilaterally, no wheezes, rhonci, crackles Cardiovascular: Regular rate and rhythm. No murmurs, gallops or rubs. Abdomen:Soft. Bowel sounds present. Non-tender.  Extremities: No lower extremity edema. Pulses are 2 + in the bilateral DP/PT  Cardiac cath March 2002:  The left main coronary artery is free of significant  disease.  The LAD coursed to the apex. There was about 80% segmental narrowing at the  origin of the large diagonal branch. The large diagonal branch itself had an  eccentric 80% plaque.  The internal mammary to the diagonal subsequently going to the LAD was patent.  There was perhaps irregularity beyond the insertion site at the diagonal  leading into the LAD segment but this was not focally significant. Runoff  into both diagonal and LAD distally was without significant narrowing.  The circumflex proper provided both two marginal branches and an AV  circumflex. There was minimal luminal irregularity but no high-grade  stenosis.  The right coronary artery was a dominant vessel providing posterior descending  and posterolateral branch. There is perhaps 30-40% narrowing throughout the  whole proximal and mid segment. However, high-grade focal disease was not  noted and this does not appear to be flow-limiting.  Echo 09/04/13:  Left ventricle: The cavity size was normal. Wall thickness was normal. Systolic function  was normal. The estimated ejection fraction was in the range of 50% to 55%. Wall motion was normal; there were no regional wall motion abnormalities. Doppler  parameters are consistent with abnormal left ventricular relaxation (grade 1 diastolic dysfunction). - Aortic valve: Mild regurgitation. - Mitral valve: There was mild systolic anterior motion of the chordal structures. - Left atrium: The atrium was mildly dilated.  Assessment and Plan:   1.CAD: Stable. He is on ASA and Plavix daily and statin. He does not tolerate beta blockers. Echo 09/04/13 with normal LV function. Recent fatigue and no recent stress testing. Will arrange Lexiscan stress myoview to exclude ischemia.   2. HLD: On a statin. Lipids followed in primary care.   3. Carotid artery disease: Mild by dopplers November 2014.   4. PVCs: No symptoms. Tolerating Cardizem CD 120 mg po Qdaily.

## 2014-03-05 ENCOUNTER — Ambulatory Visit (HOSPITAL_COMMUNITY): Payer: Medicare Other | Attending: Cardiovascular Disease | Admitting: Radiology

## 2014-03-05 VITALS — BP 94/55 | HR 57 | Ht 71.0 in | Wt 169.0 lb

## 2014-03-05 DIAGNOSIS — I2510 Atherosclerotic heart disease of native coronary artery without angina pectoris: Secondary | ICD-10-CM | POA: Insufficient documentation

## 2014-03-05 DIAGNOSIS — R5381 Other malaise: Secondary | ICD-10-CM | POA: Insufficient documentation

## 2014-03-05 DIAGNOSIS — R079 Chest pain, unspecified: Secondary | ICD-10-CM

## 2014-03-05 DIAGNOSIS — R0602 Shortness of breath: Secondary | ICD-10-CM

## 2014-03-05 DIAGNOSIS — R5383 Other fatigue: Secondary | ICD-10-CM

## 2014-03-05 MED ORDER — TECHNETIUM TC 99M SESTAMIBI GENERIC - CARDIOLITE
30.0000 | Freq: Once | INTRAVENOUS | Status: AC | PRN
  Administered 2014-03-05: 30 via INTRAVENOUS

## 2014-03-05 MED ORDER — REGADENOSON 0.4 MG/5ML IV SOLN
0.4000 mg | Freq: Once | INTRAVENOUS | Status: AC
  Administered 2014-03-05: 0.4 mg via INTRAVENOUS

## 2014-03-05 MED ORDER — TECHNETIUM TC 99M SESTAMIBI GENERIC - CARDIOLITE
10.0000 | Freq: Once | INTRAVENOUS | Status: AC | PRN
  Administered 2014-03-05: 10 via INTRAVENOUS

## 2014-03-05 NOTE — Progress Notes (Signed)
[facility] [address] [place], [place] [address] [phone]    Cardiology Nuclear Med Study  JAYCEN VERCHER is a 79 y.o. male     MRN : [id number]     DOB: 1934-11-12  Procedure Date: 03/05/2014  Nuclear Med Background Indication for Stress Test:  Evaluation for Ischemia and Graft Patency History:  CAD, Cath, CABG, 01-2005: Myocardial Perfusion Imaging-No ischemia, fixed defect cannot exclude inferior scar, EF=61% [facility]), and 08-2013 Echo: EF=50-55%, Mild AI Cardiac Risk Factors: Carotid Disease, Family History - CAD, History of Smoking, Lipids and NIDDM  Symptoms: Chest Pain with exertion (last occurrence couple weeks ago), Dizziness and DOE   Nuclear Pre-Procedure Caffeine/Decaff Intake:  None NPO After: 9:30pm   Lungs:  clear O2 Sat: 96% on room air. IV 0.9% NS with Angio Cath:  22g  IV Site: R Hand  IV Started by:  Matilde Haymaker, RN  Chest Size (in):  40 Cup Size: n/a  Height: 5\' 11"  (1.803 m)  Weight:  169 lb (76.658 kg)  BMI:  Body mass index is 23.58 kg/(m^2). Tech Comments:  Patient took Diltiazem this am. Irven Baltimore, RN.    Nuclear Med Study 1 or 2 day study: 1 day  Stress Test Type:  Treadmill/Lexiscan  Reading MD: n/a  Order Authorizing Provider:  Gennette Pac  Resting Radionuclide: Technetium 72m Sestamibi  Resting Radionuclide Dose: 11.0 mCi   Stress Radionuclide:  Technetium 30m Sestamibi  Stress Radionuclide Dose: 33.0 mCi           Stress Protocol Rest HR: 57 Stress HR: 97  Rest BP: 94/55 Stress BP: 107/53  Exercise Time (min): 2:00 METS: n/a   Predicted Max HR: 143 bpm % Max HR: 67.83 bpm Rate Pressure Product: 10379   Dose of Adenosine (mg):  n/a Dose of Lexiscan: 0.4 mg  Dose of Atropine (mg): n/a Dose of Dobutamine: n/a mcg/kg/min (at max HR)  Stress Test Technologist: Irven Baltimore, RN  Nuclear Technologist:  Charlton Amor, CNMT     Rest Procedure:  Myocardial perfusion imaging was  performed at rest 45 minutes following the intravenous administration of Technetium 52m Sestamibi. Rest ECG: Normal sinus rhythm with frequent PVCs  Stress Procedure:  The patient received IV Lexiscan 0.4 mg over 15-seconds with concurrent low level exercise and then Technetium 19m Sestamibi was injected at 30-seconds while the patient continued walking one more minute. The patient complained of headache with Lexiscan. Quantitative spect images were obtained after a 45-minute delay. Stress ECG: Sinus rhythm with T wave inversion in lead V3 through V6, mild.  QPS Raw Data Images:  Mild diaphragmatic attenuation.  Normal left ventricular size. Stress Images:  There is decreased uptake in the inferior wall. Rest Images:  There is decreased uptake in the inferior wall. Subtraction (SDS):  No evidence of ischemia. Transient Ischemic Dilatation (Normal <1.22):  1.06 Lung/Heart Ratio (Normal <0.45):  0.25  Quantitative Gated Spect Images QGS EDV:  138 ml QGS ESV:  59 ml  Impression Exercise Capacity:  Lexiscan with low level exercise. BP Response:  Normal blood pressure response. Clinical Symptoms:  Typical symptoms with Lexiscan. ECG Impression:  Nondiagnostic mild T wave inversion at low level exercise. Comparison with Prior Nuclear Study: No images to compare  Overall Impression:  Low risk stress nuclear study With no areas of significant ischemia identified. Inferior wall decreased uptake likely secondary to diaphragmatic attenuation..  LV Ejection Fraction: 57%.  LV Wall Motion:  NL LV Function; NL  Wall Motion  Candee Furbish, MD

## 2014-03-11 ENCOUNTER — Ambulatory Visit: Payer: PPO | Admitting: Internal Medicine

## 2014-03-11 ENCOUNTER — Encounter: Payer: Self-pay | Admitting: Internal Medicine

## 2014-03-11 VITALS — BP 116/62 | HR 64 | Temp 97.7°F | Resp 16 | Wt 171.2 lb

## 2014-03-11 DIAGNOSIS — R072 Precordial pain: Secondary | ICD-10-CM

## 2014-03-11 MED ORDER — PREDNISONE 20 MG PO TABS: 20.0000 mg | ORAL_TABLET | ORAL | Status: DC

## 2014-03-11 NOTE — Patient Instructions (Signed)

## 2014-03-11 NOTE — Progress Notes (Signed)
   Subjective:    Patient ID: John [name], male    DOB: 02-07-1935, 79 y.o.   MRN: [id number]  HPI Patient presents today with several day Hx/o positional Lower Rt rib or chest wall pain and tenderness which historically occurred 3 -4 hours after an episode of bending while twisting his torso and trying to stretch to pick up any object in an awkward position. The pain/discomfort has been intermittent and positional in nature.   Meds, Allergies, Hx otherwise unchanged.  Review of Systems Neg except as above.  Objective:   Physical Exam HEENT - Eac's patent. TM's Nl.EOM's full. PERRLA. NasoOroPharynx clear. Neck - supple. Nl Thyroid. Chest - Clear equal BS. Moderate tenderness to pressure palpitation in the lower Rt lateral ribs in the MAL.  Cor - Nl HS. RRR w/o sig MGR. Abd - No palpable organomegaly, masses or tenderness.  MS- FROM. w/o deformities. Muscle power tone and bulk Nl. Gait Nl. Neuro - No obvious Cr N abnormalities. Sensory, motor and Cerebellar functions appear Nl w/o focal abnormalities.  Assessment & Plan:   1. Musculoskeletal Chest Wall Pain  - predniSONE (DELTASONE) 20 MG tablet; Take 1 tablet (20 mg total) by mouth See admin instructions. 1 tab 3 x day for 3 days, then 1 tab 2 x day for 3 days, then 1 tab 1 x day for 5 days  Dispense: 20 tablet; Refill: 0  - recc try heating pad or ice packs.

## 2014-03-20 ENCOUNTER — Inpatient Hospital Stay (HOSPITAL_COMMUNITY)
Admission: EM | Admit: 2014-03-20 | Discharge: 2014-03-24 | DRG: 372 | Disposition: A | Discharge status: Home / Self Care | Payer: Medicare Other | Attending: Internal Medicine | Admitting: Internal Medicine

## 2014-03-20 ENCOUNTER — Observation Stay (HOSPITAL_COMMUNITY): Payer: Medicare Other

## 2014-03-20 ENCOUNTER — Encounter (HOSPITAL_COMMUNITY): Payer: Self-pay | Admitting: Emergency Medicine

## 2014-03-20 ENCOUNTER — Emergency Department (HOSPITAL_COMMUNITY): Payer: Medicare Other

## 2014-03-20 DIAGNOSIS — E860 Dehydration: Secondary | ICD-10-CM

## 2014-03-20 DIAGNOSIS — Z7902 Long term (current) use of antithrombotics/antiplatelets: Secondary | ICD-10-CM

## 2014-03-20 DIAGNOSIS — K219 Gastro-esophageal reflux disease without esophagitis: Secondary | ICD-10-CM | POA: Diagnosis present

## 2014-03-20 DIAGNOSIS — K5730 Diverticulosis of large intestine without perforation or abscess without bleeding: Secondary | ICD-10-CM | POA: Diagnosis present

## 2014-03-20 DIAGNOSIS — I10 Essential (primary) hypertension: Secondary | ICD-10-CM

## 2014-03-20 DIAGNOSIS — N1831 Chronic kidney disease, stage 3a: Secondary | ICD-10-CM

## 2014-03-20 DIAGNOSIS — D72829 Elevated white blood cell count, unspecified: Secondary | ICD-10-CM

## 2014-03-20 DIAGNOSIS — K2100 Gastro-esophageal reflux disease with esophagitis, without bleeding: Secondary | ICD-10-CM

## 2014-03-20 DIAGNOSIS — F411 Generalized anxiety disorder: Secondary | ICD-10-CM | POA: Diagnosis present

## 2014-03-20 DIAGNOSIS — K449 Diaphragmatic hernia without obstruction or gangrene: Secondary | ICD-10-CM | POA: Diagnosis present

## 2014-03-20 DIAGNOSIS — I129 Hypertensive chronic kidney disease with stage 1 through stage 4 chronic kidney disease, or unspecified chronic kidney disease: Secondary | ICD-10-CM | POA: Diagnosis present

## 2014-03-20 DIAGNOSIS — K31819 Angiodysplasia of stomach and duodenum without bleeding: Secondary | ICD-10-CM

## 2014-03-20 DIAGNOSIS — N179 Acute kidney failure, unspecified: Secondary | ICD-10-CM | POA: Diagnosis present

## 2014-03-20 DIAGNOSIS — Z87442 Personal history of urinary calculi: Secondary | ICD-10-CM

## 2014-03-20 DIAGNOSIS — Z7982 Long term (current) use of aspirin: Secondary | ICD-10-CM

## 2014-03-20 DIAGNOSIS — J449 Chronic obstructive pulmonary disease, unspecified: Secondary | ICD-10-CM | POA: Diagnosis present

## 2014-03-20 DIAGNOSIS — G4700 Insomnia, unspecified: Secondary | ICD-10-CM | POA: Diagnosis present

## 2014-03-20 DIAGNOSIS — E119 Type 2 diabetes mellitus without complications: Secondary | ICD-10-CM | POA: Diagnosis present

## 2014-03-20 DIAGNOSIS — I498 Other specified cardiac arrhythmias: Secondary | ICD-10-CM | POA: Diagnosis present

## 2014-03-20 DIAGNOSIS — Z888 Allergy status to other drugs, medicaments and biological substances status: Secondary | ICD-10-CM

## 2014-03-20 DIAGNOSIS — Q2733 Arteriovenous malformation of digestive system vessel: Secondary | ICD-10-CM

## 2014-03-20 DIAGNOSIS — Z860100 Personal history of colon polyps, unspecified: Secondary | ICD-10-CM

## 2014-03-20 DIAGNOSIS — G459 Transient cerebral ischemic attack, unspecified: Secondary | ICD-10-CM

## 2014-03-20 DIAGNOSIS — Z8679 Personal history of other diseases of the circulatory system: Secondary | ICD-10-CM

## 2014-03-20 DIAGNOSIS — R7989 Other specified abnormal findings of blood chemistry: Secondary | ICD-10-CM | POA: Diagnosis present

## 2014-03-20 DIAGNOSIS — Z951 Presence of aortocoronary bypass graft: Secondary | ICD-10-CM

## 2014-03-20 DIAGNOSIS — Z79899 Other long term (current) drug therapy: Secondary | ICD-10-CM

## 2014-03-20 DIAGNOSIS — K269 Duodenal ulcer, unspecified as acute or chronic, without hemorrhage or perforation: Secondary | ICD-10-CM

## 2014-03-20 DIAGNOSIS — E785 Hyperlipidemia, unspecified: Secondary | ICD-10-CM

## 2014-03-20 DIAGNOSIS — I2510 Atherosclerotic heart disease of native coronary artery without angina pectoris: Secondary | ICD-10-CM | POA: Diagnosis present

## 2014-03-20 DIAGNOSIS — R55 Syncope and collapse: Secondary | ICD-10-CM

## 2014-03-20 DIAGNOSIS — E1129 Type 2 diabetes mellitus with other diabetic kidney complication: Secondary | ICD-10-CM

## 2014-03-20 DIAGNOSIS — R197 Diarrhea, unspecified: Secondary | ICD-10-CM

## 2014-03-20 DIAGNOSIS — I25810 Atherosclerosis of coronary artery bypass graft(s) without angina pectoris: Secondary | ICD-10-CM

## 2014-03-20 DIAGNOSIS — A0472 Enterocolitis due to Clostridium difficile, not specified as recurrent: Principal | ICD-10-CM

## 2014-03-20 DIAGNOSIS — Z87891 Personal history of nicotine dependence: Secondary | ICD-10-CM

## 2014-03-20 DIAGNOSIS — J4489 Other specified chronic obstructive pulmonary disease: Secondary | ICD-10-CM

## 2014-03-20 DIAGNOSIS — E559 Vitamin D deficiency, unspecified: Secondary | ICD-10-CM

## 2014-03-20 DIAGNOSIS — Z8673 Personal history of transient ischemic attack (TIA), and cerebral infarction without residual deficits: Secondary | ICD-10-CM

## 2014-03-20 DIAGNOSIS — N183 Chronic kidney disease, stage 3 (moderate): Secondary | ICD-10-CM

## 2014-03-20 DIAGNOSIS — N189 Chronic kidney disease, unspecified: Secondary | ICD-10-CM

## 2014-03-20 DIAGNOSIS — J45909 Unspecified asthma, uncomplicated: Secondary | ICD-10-CM

## 2014-03-20 DIAGNOSIS — I959 Hypotension, unspecified: Secondary | ICD-10-CM

## 2014-03-20 HISTORY — DX: Calculus of kidney: N20.0

## 2014-03-20 HISTORY — DX: Sleep apnea, unspecified: G47.30

## 2014-03-20 LAB — CBC WITH DIFFERENTIAL/PLATELET
BASOS ABS: 0.3 10*3/uL — AB (ref 0.0–0.1)
BASOS PCT: 2 % — AB (ref 0–1)
Band Neutrophils: 0 % (ref 0–10)
Blasts: 0 %
Eosinophils Absolute: 2.8 10*3/uL — ABNORMAL HIGH (ref 0.0–0.7)
Eosinophils Relative: 22 % — ABNORMAL HIGH (ref 0–5)
HCT: 43.7 % (ref 39.0–52.0)
Hemoglobin: 15.3 g/dL (ref 13.0–17.0)
LYMPHS ABS: 2.3 10*3/uL (ref 0.7–4.0)
LYMPHS PCT: 18 % (ref 12–46)
MCH: 32.1 pg (ref 26.0–34.0)
MCHC: 35 g/dL (ref 30.0–36.0)
MCV: 91.8 fL (ref 78.0–100.0)
MYELOCYTES: 0 %
Metamyelocytes Relative: 0 %
Monocytes Absolute: 0.5 10*3/uL (ref 0.1–1.0)
Monocytes Relative: 4 % (ref 3–12)
NEUTROS PCT: 54 % (ref 43–77)
NRBC: 0 /100{WBCs}
Neutro Abs: 6.7 10*3/uL (ref 1.7–7.7)
PLATELETS: 201 10*3/uL (ref 150–400)
PROMYELOCYTES ABS: 0 %
RBC: 4.76 MIL/uL (ref 4.22–5.81)
RDW: 13.7 % (ref 11.5–15.5)
WBC: 12.6 10*3/uL — ABNORMAL HIGH (ref 4.0–10.5)

## 2014-03-20 LAB — COMPREHENSIVE METABOLIC PANEL
ALBUMIN: 3.5 g/dL (ref 3.5–5.2)
ALK PHOS: 65 U/L (ref 39–117)
ALT: 6 U/L (ref 0–53)
AST: 13 U/L (ref 0–37)
BUN: 24 mg/dL — ABNORMAL HIGH (ref 6–23)
CHLORIDE: 105 meq/L (ref 96–112)
CO2: 20 meq/L (ref 19–32)
Calcium: 9 mg/dL (ref 8.4–10.5)
Creatinine, Ser: 1.49 mg/dL — ABNORMAL HIGH (ref 0.50–1.35)
GFR calc Af Amer: 50 mL/min — ABNORMAL LOW (ref >90)
GFR, EST NON AFRICAN AMERICAN: 44 mL/min — AB (ref >90)
Glucose, Bld: 111 mg/dL — ABNORMAL HIGH (ref 70–99)
POTASSIUM: 4.3 meq/L (ref 3.7–5.3)
Sodium: 139 mEq/L (ref 137–147)
Total Bilirubin: 0.5 mg/dL (ref 0.3–1.2)
Total Protein: 6.8 g/dL (ref 6.0–8.3)

## 2014-03-20 LAB — URINALYSIS, ROUTINE W REFLEX MICROSCOPIC
BILIRUBIN URINE: NEGATIVE
GLUCOSE, UA: NEGATIVE mg/dL
Hgb urine dipstick: NEGATIVE
KETONES UR: NEGATIVE mg/dL
Leukocytes, UA: NEGATIVE
Nitrite: NEGATIVE
PH: 5.5 (ref 5.0–8.0)
PROTEIN: NEGATIVE mg/dL
Specific Gravity, Urine: 1.024 (ref 1.005–1.030)
Urobilinogen, UA: 0.2 mg/dL (ref 0.0–1.0)

## 2014-03-20 LAB — TSH: TSH: 1.71 u[IU]/mL (ref 0.350–4.500)

## 2014-03-20 LAB — I-STAT CG4 LACTIC ACID, ED: Lactic Acid, Venous: 0.64 mmol/L (ref 0.5–2.2)

## 2014-03-20 LAB — OCCULT BLOOD X 1 CARD TO LAB, STOOL: FECAL OCCULT BLD: NEGATIVE

## 2014-03-20 LAB — CBG MONITORING, ED: Glucose-Capillary: 94 mg/dL (ref 70–99)

## 2014-03-20 LAB — LIPASE, BLOOD: Lipase: 12 U/L (ref 11–59)

## 2014-03-20 MED ORDER — ONDANSETRON HCL 4 MG/2ML IJ SOLN
4.0000 mg | Freq: Once | INTRAMUSCULAR | Status: AC
  Administered 2014-03-20: 4 mg via INTRAVENOUS
  Filled 2014-03-20: qty 2

## 2014-03-20 MED ORDER — ONDANSETRON HCL 4 MG/2ML IJ SOLN: 4.0000 mg | Freq: Four times a day (QID) | INTRAMUSCULAR | Status: DC | PRN

## 2014-03-20 MED ORDER — ONDANSETRON HCL 4 MG PO TABS: 4.0000 mg | ORAL_TABLET | Freq: Four times a day (QID) | ORAL | Status: DC | PRN

## 2014-03-20 MED ORDER — QUETIAPINE FUMARATE 25 MG PO TABS
25.0000 mg | ORAL_TABLET | Freq: Three times a day (TID) | ORAL | Status: DC
  Administered 2014-03-20 – 2014-03-24 (×11): 25 mg via ORAL
  Filled 2014-03-20 (×15): qty 1

## 2014-03-20 MED ORDER — OXYCODONE-ACETAMINOPHEN 5-325 MG PO TABS
1.0000 | ORAL_TABLET | Freq: Once | ORAL | Status: AC
  Administered 2014-03-20: 1 via ORAL
  Filled 2014-03-20: qty 1

## 2014-03-20 MED ORDER — ACETAMINOPHEN 325 MG PO TABS
650.0000 mg | ORAL_TABLET | Freq: Four times a day (QID) | ORAL | Status: DC | PRN
  Administered 2014-03-24: 650 mg via ORAL
  Filled 2014-03-20: qty 2

## 2014-03-20 MED ORDER — SODIUM CHLORIDE 0.9 % IV BOLUS (SEPSIS)
1000.0000 mL | Freq: Once | INTRAVENOUS | Status: AC
  Administered 2014-03-20: 1000 mL via INTRAVENOUS

## 2014-03-20 MED ORDER — PANTOPRAZOLE SODIUM 40 MG PO TBEC
40.0000 mg | DELAYED_RELEASE_TABLET | Freq: Two times a day (BID) | ORAL | Status: DC
  Administered 2014-03-20: 40 mg via ORAL
  Filled 2014-03-20: qty 1

## 2014-03-20 MED ORDER — CLOPIDOGREL BISULFATE 75 MG PO TABS
75.0000 mg | ORAL_TABLET | Freq: Every day | ORAL | Status: DC
  Administered 2014-03-20 – 2014-03-24 (×5): 75 mg via ORAL
  Filled 2014-03-20 (×5): qty 1

## 2014-03-20 MED ORDER — ASPIRIN 81 MG PO TABS: 81.0000 mg | ORAL_TABLET | Freq: Every day | ORAL | Status: DC

## 2014-03-20 MED ORDER — SODIUM CHLORIDE 0.9 % IJ SOLN
3.0000 mL | Freq: Two times a day (BID) | INTRAMUSCULAR | Status: DC
Start: 2014-03-20 — End: 2014-03-22
  Administered 2014-03-20 – 2014-03-21 (×2): 3 mL via INTRAVENOUS

## 2014-03-20 MED ORDER — POTASSIUM CITRATE ER 10 MEQ (1080 MG) PO TBCR
10.0000 meq | EXTENDED_RELEASE_TABLET | Freq: Two times a day (BID) | ORAL | Status: DC
  Filled 2014-03-20: qty 1

## 2014-03-20 MED ORDER — SODIUM CHLORIDE 0.9 % IV SOLN: 250.0000 mL | INTRAVENOUS | Status: DC | PRN

## 2014-03-20 MED ORDER — SODIUM CHLORIDE 0.9 % IJ SOLN: 3.0000 mL | INTRAMUSCULAR | Status: DC | PRN

## 2014-03-20 MED ORDER — ACETAMINOPHEN 650 MG RE SUPP: 650.0000 mg | Freq: Four times a day (QID) | RECTAL | Status: DC | PRN

## 2014-03-20 MED ORDER — ASPIRIN EC 81 MG PO TBEC
81.0000 mg | DELAYED_RELEASE_TABLET | Freq: Every day | ORAL | Status: DC
  Administered 2014-03-20 – 2014-03-24 (×5): 81 mg via ORAL
  Filled 2014-03-20 (×5): qty 1

## 2014-03-20 MED ORDER — DILTIAZEM HCL ER COATED BEADS 120 MG PO CP24
120.0000 mg | ORAL_CAPSULE | Freq: Every day | ORAL | Status: DC
  Administered 2014-03-20 – 2014-03-21 (×2): 120 mg via ORAL
  Filled 2014-03-20 (×3): qty 1

## 2014-03-20 MED ORDER — ENOXAPARIN SODIUM 40 MG/0.4ML ~~LOC~~ SOLN
40.0000 mg | SUBCUTANEOUS | Status: DC
  Administered 2014-03-20 – 2014-03-23 (×4): 40 mg via SUBCUTANEOUS
  Filled 2014-03-20 (×5): qty 0.4

## 2014-03-20 MED ORDER — METRONIDAZOLE 250 MG PO TABS
250.0000 mg | ORAL_TABLET | Freq: Four times a day (QID) | ORAL | Status: DC
  Administered 2014-03-20 – 2014-03-21 (×3): 250 mg via ORAL
  Filled 2014-03-20 (×7): qty 1

## 2014-03-20 MED ORDER — HYOSCYAMINE SULFATE 0.125 MG SL SUBL
0.2500 mg | SUBLINGUAL_TABLET | Freq: Once | SUBLINGUAL | Status: AC
  Administered 2014-03-20: 0.25 mg via SUBLINGUAL
  Filled 2014-03-20: qty 2

## 2014-03-20 MED ORDER — PROMETHAZINE HCL 25 MG PO TABS: 25.0000 mg | ORAL_TABLET | Freq: Three times a day (TID) | ORAL | Status: DC | PRN

## 2014-03-20 MED ORDER — MORPHINE SULFATE 4 MG/ML IJ SOLN
4.0000 mg | Freq: Once | INTRAMUSCULAR | Status: AC
  Administered 2014-03-20: 4 mg via INTRAVENOUS
  Filled 2014-03-20: qty 1

## 2014-03-20 MED ORDER — POTASSIUM CHLORIDE CRYS ER 10 MEQ PO TBCR
10.0000 meq | EXTENDED_RELEASE_TABLET | Freq: Two times a day (BID) | ORAL | Status: DC
  Administered 2014-03-20 – 2014-03-21 (×2): 10 meq via ORAL
  Filled 2014-03-20 (×4): qty 1

## 2014-03-20 MED ORDER — SODIUM CHLORIDE 0.9 % IV SOLN
1000.0000 mL | Freq: Once | INTRAVENOUS | Status: AC
  Administered 2014-03-20: 1000 mL via INTRAVENOUS

## 2014-03-20 MED ORDER — ALPRAZOLAM 0.5 MG PO TABS
1.0000 mg | ORAL_TABLET | Freq: Every day | ORAL | Status: DC
  Administered 2014-03-20 – 2014-03-23 (×4): 1 mg via ORAL
  Filled 2014-03-20 (×4): qty 2

## 2014-03-20 MED ORDER — FENTANYL CITRATE 0.05 MG/ML IJ SOLN
50.0000 ug | Freq: Once | INTRAMUSCULAR | Status: AC
  Administered 2014-03-20: 50 ug via INTRAVENOUS
  Filled 2014-03-20: qty 2

## 2014-03-20 MED ORDER — ATORVASTATIN CALCIUM 40 MG PO TABS
40.0000 mg | ORAL_TABLET | Freq: Every day | ORAL | Status: DC
  Administered 2014-03-20 – 2014-03-23 (×4): 40 mg via ORAL
  Filled 2014-03-20 (×5): qty 1

## 2014-03-20 NOTE — ED Notes (Addendum)
Patient transported to CT 

## 2014-03-20 NOTE — ED Notes (Signed)
Pt reports he has been having diarrhea, abd pain and nausea X 1 month, sts very watery stools. Denies blood/dark tarry stools. Denies vomiting. sts he used the restroom 15 times just last night. Pt sts when he was getting up to go to the bathroom he felt like he was going to pass out then went down to the kitchen and lost consciousness for a few seconds. Pt sts he thinks he landed on his elbow when he fell. Denies HA. Pt is currently taking plavix.sts he has an appointment with his GI doctor but can't wait that long so he is here. Nad, skin warm and dry, resp e/u. A&Ox4.

## 2014-03-20 NOTE — Progress Notes (Signed)
Patient trasfered from ED to [phone] via wheelchair; alert and oriented x 4; low abdominal pain (pt refused pain medicine); IV saline locked in RAC ; skin - abrasion on right elbow. Orient patient to room and unit;  gave patient care guide; instructed how to use the call bell and  fall risk precautions. Will continue to monitor the patient.

## 2014-03-20 NOTE — ED Provider Notes (Signed)
CSN: [id number]     Arrival date & time 03/20/14  1101 History   First MD Initiated Contact with Patient 03/20/14 1126     Chief Complaint  Patient presents with  . Abdominal Pain  . Loss of Consciousness  . Diarrhea     (Consider location/radiation/quality/duration/timing/severity/associated sxs/prior Treatment) The history is provided by the patient, medical records and the spouse. No language interpreter was used.    This is a 79 year old male, who presents to the emergency department for diarrhea, and loss of consciousness.  He has a complicated past medical history, which includes arterial disease, diabetes, COPD. ,  Patient states, that he has had one month of profuse, and voluminous watery diarrhea daily.  He, states that in 1989.  He had a similar episode for which he had to be hospitalized.  He is still was sent for culture.  At this [facility], and he was found to have a "the world disease."  He states that he got this from imported tobacco leaves from [place].  I did review his records and was unable to find a history of this occurrence although his wife, states it's true.  Our records.  Do not go this far back. Patient complains of central abdominal pain and nausea.  He denies any vomiting.  He, states that his stool is watery and brown.  He denies any other changes.  He complains of feelings of presyncope for the past few days.  He lost consciousness.  Today, when standing up from the restroom.  He, states that yesterday.  He had more than 20 episodes of watery diarrhea, and this has been going on since he began 1 month ago, per the patient called his gastroenterologist.  He stated he needed to come to the emergency department for further evaluation.  Patient denies hitting his head, although he is on Plavix and did land on a solid hardwood floors.  Past Medical History  Diagnosis Date  . TIA   . SYNCOPE   . HYPERTENSION   . HYPERLIPIDEMIA   . CAD, ARTERY BYPASS GRAFT   . TRANSIENT  ISCHEMIC ATTACKS, HX OF   . SUPRAVENTRICULAR TACHYCARDIA, HX OF   . NEPHROLITHIASIS, HX OF   . Leukodystrophy   . GASTROESOPHAGEAL REFLUX DISEASE   . DIABETES MELLITUS, TYPE II   . ASTHMA   . Diverticulosis   . Hiatal hernia   . Insomnia   . Anxiety   . Arthritis     LEFT ANKLE  . Blood transfusion   . COPD     PT. DENIES  . Stroke     MINI 4/5 YEARS AGO  . Ulcer   . Colon polyps     adenomatous polyps   Past Surgical History  Procedure Laterality Date  . Coronary artery bypass graft  1996  . Cardiac catheterization    . Gastrectomy    . Vagotomy    . Cystourethroscopy with stent removal.    . Sebaceous cyst removal    . Colonoscopy    . Upper gastrointestinal endoscopy    . Hemorrhoid surgery    . Polypectomy     Family History  Problem Relation Age of Onset  . Heart disease Brother   . Diabetes Brother   . Alcohol abuse Father   . Heart disease Paternal Grandmother   . Colon cancer Neg Hx   . Stomach cancer Neg Hx    History  Substance Use Topics  . Smoking status: Former Smoker  Types: Cigarettes    Quit date: 10/25/1962  . Smokeless tobacco: Former Systems developer    Quit date: 10/25/1962  . Alcohol Use: No    Review of Systems  Ten systems reviewed and are negative for acute change, except as noted in the HPI.    Allergies  Beta adrenergic blockers; Gabapentin; Prozac; and Soma  Home Medications   Prior to Admission medications   Medication Sig Start Date End Date Taking? Authorizing Provider  ALPRAZolam Duanne Moron) 1 MG tablet Take 1 mg by mouth at bedtime.    Yes Historical Provider, MD  aspirin 81 MG tablet Take 81 mg by mouth daily.     Yes Historical Provider, MD  Cholecalciferol (VITAMIN D3) 2000 UNITS capsule Take 2,000 Units by mouth daily.     Yes Historical Provider, MD  clopidogrel (PLAVIX) 75 MG tablet Take 75 mg by mouth daily.     Yes Historical Provider, MD  diltiazem (CARDIZEM CD) 120 MG 24 hr capsule Take 1 capsule (120 mg total) by mouth  daily. 08/14/13  Yes Burnell Blanks, MD  glucose blood (FREESTYLE LITE) test strip 1 each by Other route as needed for other. Use as instructed   Yes Historical Provider, MD  Lancets (FREESTYLE) lancets 1 each by Other route as needed for other. Use as instructed   Yes Historical Provider, MD  loperamide (IMODIUM A-D) 2 MG tablet Take 2 mg by mouth 4 (four) times daily as needed for diarrhea or loose stools.   Yes Historical Provider, MD  LOTEMAX 0.5 % GEL Apply 1 drop to eye daily.  07/09/13  Yes Historical Provider, MD  nitroGLYCERIN (NITROSTAT) 0.4 MG SL tablet Sig: 1 tablet under tongue every 3 to 5 minutes as needed for Angina - Please Dispense # 2 bottles of # 25 tabs 11/08/13  Yes Unk Pinto, MD  pantoprazole (PROTONIX) 40 MG tablet Take 1 tablet (40 mg total) by mouth 2 (two) times daily. 01/18/14  Yes Unk Pinto, MD  potassium citrate (UROCIT-K) 10 MEQ (1080 MG) SR tablet Take 10 mEq by mouth 2 (two) times daily.   Yes Historical Provider, MD  promethazine (PHENERGAN) 25 MG tablet Take 25 mg by mouth every 8 (eight) hours as needed for nausea.  07/18/12  Yes Historical Provider, MD  QUEtiapine (SEROQUEL) 25 MG tablet Take 1 tablet (25 mg total) by mouth 3 (three) times daily. 11/08/13  Yes Unk Pinto, MD  simvastatin (ZOCOR) 80 MG tablet Take 40 mg by mouth at bedtime. Takes 1/2 tab=40mg  09/27/13  Yes Unk Pinto, MD  PREVALITE 4 G packet Take 1 packet by mouth 2 (two) times daily. 03/06/14   Historical Provider, MD   BP 95/59  Pulse 78  Temp(Src) 97.8 F (36.6 C) (Oral)  Resp 15  Ht 5\' 11"  (1.803 m)  Wt 172 lb (78.019 kg)  BMI 24.00 kg/m2  SpO2 95% Physical Exam  Nursing note and vitals reviewed. Constitutional: He is oriented to person, place, and time. He appears well-developed and well-nourished. No distress.  Pale, ill appearing  HENT:  Head: Normocephalic and atraumatic.  Dry oral mucosa  Eyes: Conjunctivae are normal. No scleral icterus.  Neck: Normal  range of motion. Neck supple.  Cardiovascular: Normal rate, regular rhythm and normal heart sounds.   Pulmonary/Chest: Effort normal and breath sounds normal. No respiratory distress.  Abdominal: Soft. He exhibits no distension and no mass. There is no tenderness. There is no guarding.  Musculoskeletal: Normal range of motion. He exhibits no edema.  Neurological: He is alert and oriented to person, place, and time.  Skin: Skin is warm and dry. He is not diaphoretic.  Psychiatric: His behavior is normal.    ED Course  Procedures (including critical care time) Labs Review Labs Reviewed  CBC WITH DIFFERENTIAL - Abnormal; Notable for the following:    WBC 12.6 (*)    All other components within normal limits  COMPREHENSIVE METABOLIC PANEL - Abnormal; Notable for the following:    Glucose, Bld 111 (*)    BUN 24 (*)    Creatinine, Ser 1.49 (*)    GFR calc non Af Amer 44 (*)    GFR calc Af Amer 50 (*)    All other components within normal limits  CLOSTRIDIUM DIFFICILE BY PCR  STOOL CULTURE  OVA AND PARASITE EXAMINATION  LIPASE, BLOOD  URINALYSIS, ROUTINE W REFLEX MICROSCOPIC  CBG MONITORING, ED  I-STAT CG4 LACTIC ACID, ED    Imaging Review No results found.   EKG Interpretation None      MDM   Final diagnoses:  None   Filed Vitals:   03/20/14 1117 03/20/14 1145 03/20/14 1200  BP: 80/54 95/59   Pulse: 86 82 78  Temp: 97.8 F (36.6 C)    TempSrc: Oral    Resp: 16 15 15   Height: 5\' 11"  (1.803 m)    Weight: 172 lb (78.019 kg)    SpO2: 96% 95% 95%    Patient with syncope, likely secondary to dehydration.  Initial initial vitals show a blood pressure of 80/54.  A very hypotensive.  Patient is getting a bolus of fluid.  I do not suspect a dissecting abdominal aortic aneurysm, and his pain.  Patient has no pain with palpation.  On examination. Patient does have an elevated leukocytosis.  BUN and creatinine are elevated.    Patient given bolus of fluid with some  improvement of his sysotolic pressure into the 100s. I have given report to Dr. Eliseo Squires who will admit the patient to stepdown. CT scans negative. He continues to have very soft pressures. She states that his last op pcp visit had NO mention of diarrhea On repeat exam his abdomen is still non-tender.  Patient / Family / Caregiver informed of clinical course, understand medical decision-making process, and agree with plan. The patient appears reasonably stabilized for admission considering the current resources, flow, and capabilities available in the ED at this time, and I doubt any other [facility] requiring further screening and/or treatment in the ED prior to admission.    Margarita Mail, PA-C 03/21/14 1108

## 2014-03-20 NOTE — Consult Note (Deleted)
Referring Provider: No ref. provider found Primary Care Physician:  Alesia Richards, MD Primary Gastroenterologist:  Dr. Deatra Ina  Reason for Consultation:  Diarrhea  HPI: John [name] is a 79 y.o. male who is known to Dr. Deatra Ina for previous colonoscopies and upper endoscopies.  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  PMH includes CAD with previous CABG, HTN, HLD, DM, renal stones and TIA's.  He came to the ED today with complaints of diarrhea and a syncopal episode. He says that in 1989 he was diagnosed with dysentery (stool was sent to the [facility]) and ever since then he has intermittent diarrhea, sometimes lasting a week at a time, but is usually controlled with prn Lomotil.  He says that 4 weeks ago he developed diarrhea and it has been persistent despite Lomotil, Imodium, and questran.  He has been getting up 20x a night with diarrhea. Last night he developed some abdominal pain/cramps and had a syncopal event when getting up to get a drink. He says that he called to see his GI doc Deatra Ina) but was unable to get an appointment until July.  Denies blood in stool, fevers, and chill.  Some nausea but no vomiting.  Denies recent antibiotic use and travel.  He has a leukocytosis of 12.6 with 22% eosinophils.  Lactic acid and lipase normal.  Slightly worse renal function from baseline.  Stool for Cdiff, culture, and O&P have been ordered.  Initially was hypotensive, but that was resolved with administration of IVF's.  Last colonoscopy was in 08/2013 at which time he had a 3 mm sessile polyp removed from the cecum that was a tubular adenoma.  Also had mild diverticulosis in the transverse, descending, and sigmoid colon.   Past Medical History  Diagnosis Date  . TIA   . SYNCOPE   . HYPERTENSION   . HYPERLIPIDEMIA   . CAD, ARTERY BYPASS GRAFT   . TRANSIENT ISCHEMIC ATTACKS, HX OF   .  SUPRAVENTRICULAR TACHYCARDIA, HX OF   . NEPHROLITHIASIS, HX OF   . Leukodystrophy   . GASTROESOPHAGEAL REFLUX DISEASE   . DIABETES MELLITUS, TYPE II   . ASTHMA   . Diverticulosis   . Hiatal hernia   . Insomnia   . Anxiety   . Arthritis     LEFT ANKLE  . Blood transfusion   . COPD     PT. DENIES  . Stroke     MINI 4/5 YEARS AGO  . Ulcer   . Colon polyps     adenomatous polyps    Past Surgical History  Procedure Laterality Date  . Coronary artery bypass graft  1996  . Cardiac catheterization    . Gastrectomy    . Vagotomy    . Cystourethroscopy with stent removal.    . Sebaceous cyst removal    . Colonoscopy    . Upper gastrointestinal endoscopy    . Hemorrhoid surgery    . Polypectomy      Prior to Admission medications   Medication Sig Start Date End Date Taking? Authorizing Provider  ALPRAZolam Duanne Moron) 1 MG tablet Take 1 mg by mouth at bedtime.    Yes Historical Provider, MD  aspirin 81 MG tablet Take 81 mg by mouth daily.     Yes Historical Provider, MD  Cholecalciferol (VITAMIN D3) 2000 UNITS capsule Take 2,000 Units by mouth daily.     Yes Historical Provider, MD  clopidogrel (PLAVIX) 75 MG tablet  Take 75 mg by mouth daily.     Yes Historical Provider, MD  diltiazem (CARDIZEM CD) 120 MG 24 hr capsule Take 1 capsule (120 mg total) by mouth daily. 08/14/13  Yes Burnell Blanks, MD  glucose blood (FREESTYLE LITE) test strip 1 each by Other route as needed for other. Use as instructed   Yes Historical Provider, MD  Lancets (FREESTYLE) lancets 1 each by Other route as needed for other. Use as instructed   Yes Historical Provider, MD  loperamide (IMODIUM A-D) 2 MG tablet Take 2 mg by mouth 4 (four) times daily as needed for diarrhea or loose stools.   Yes Historical Provider, MD  LOTEMAX 0.5 % GEL Apply 1 drop to eye daily.  07/09/13  Yes Historical Provider, MD  nitroGLYCERIN (NITROSTAT) 0.4 MG SL tablet Sig: 1 tablet under tongue every 3 to 5 minutes as needed for  Angina - Please Dispense # 2 bottles of # 25 tabs 11/08/13  Yes Unk Pinto, MD  pantoprazole (PROTONIX) 40 MG tablet Take 1 tablet (40 mg total) by mouth 2 (two) times daily. 01/18/14  Yes Unk Pinto, MD  potassium citrate (UROCIT-K) 10 MEQ (1080 MG) SR tablet Take 10 mEq by mouth 2 (two) times daily.   Yes Historical Provider, MD  promethazine (PHENERGAN) 25 MG tablet Take 25 mg by mouth every 8 (eight) hours as needed for nausea.  07/18/12  Yes Historical Provider, MD  QUEtiapine (SEROQUEL) 25 MG tablet Take 1 tablet (25 mg total) by mouth 3 (three) times daily. 11/08/13  Yes Unk Pinto, MD  simvastatin (ZOCOR) 80 MG tablet Take 40 mg by mouth at bedtime. Takes 1/2 tab=40mg  09/27/13  Yes Unk Pinto, MD  PREVALITE 4 G packet Take 1 packet by mouth 2 (two) times daily. 03/06/14   Historical Provider, MD    No current facility-administered medications for this encounter.   Current Outpatient Prescriptions  Medication Sig Dispense Refill  . ALPRAZolam (XANAX) 1 MG tablet Take 1 mg by mouth at bedtime.       [name] aspirin 81 MG tablet Take 81 mg by mouth daily.        . Cholecalciferol (VITAMIN D3) 2000 UNITS capsule Take 2,000 Units by mouth daily.        . clopidogrel (PLAVIX) 75 MG tablet Take 75 mg by mouth daily.        [name] diltiazem (CARDIZEM CD) 120 MG 24 hr capsule Take 1 capsule (120 mg total) by mouth daily.  90 capsule  3  . glucose blood (FREESTYLE LITE) test strip 1 each by Other route as needed for other. Use as instructed      . Lancets (FREESTYLE) lancets 1 each by Other route as needed for other. Use as instructed      . loperamide (IMODIUM A-D) 2 MG tablet Take 2 mg by mouth 4 (four) times daily as needed for diarrhea or loose stools.      [name] LOTEMAX 0.5 % GEL Apply 1 drop to eye daily.       . nitroGLYCERIN (NITROSTAT) 0.4 MG SL tablet Sig: 1 tablet under tongue every 3 to 5 minutes as needed for Angina - Please Dispense # 2 bottles of # 25 tabs  50 tablet  99  .  pantoprazole (PROTONIX) 40 MG tablet Take 1 tablet (40 mg total) by mouth 2 (two) times daily.  180 tablet  4  . potassium citrate (UROCIT-K) 10 MEQ (1080 MG) SR tablet Take 10 mEq by mouth  2 (two) times daily.      . promethazine (PHENERGAN) 25 MG tablet Take 25 mg by mouth every 8 (eight) hours as needed for nausea.       [name] QUEtiapine (SEROQUEL) 25 MG tablet Take 1 tablet (25 mg total) by mouth 3 (three) times daily.  90 tablet  99  . simvastatin (ZOCOR) 80 MG tablet Take 40 mg by mouth at bedtime. Takes 1/2 tab=40mg       . PREVALITE 4 G packet Take 1 packet by mouth 2 (two) times daily.        Allergies as of 03/20/2014 - Review Complete 03/20/2014  Allergen Reaction Noted  . Beta adrenergic blockers  03/12/2009  . Gabapentin  10/04/2013  . Prozac [fluoxetine hcl]  10/22/2013  . Soma [carisoprodol]  10/22/2013    Family History  Problem Relation Age of Onset  . Heart disease Brother   . Diabetes Brother   . Alcohol abuse Father   . Heart disease Paternal Grandmother   . Colon cancer Neg Hx   . Stomach cancer Neg Hx     History   Social History  . Marital Status: Married    Spouse Name: N/A    Number of Children: 2  . Years of Education: N/A   Occupational History  . retired-maintenance dept at [employer]  . Smoking status: Former Smoker    Types: Cigarettes    Quit date: 10/25/1962  . Smokeless tobacco: Former Systems developer    Quit date: 10/25/1962  . Alcohol Use: No  . Drug Use: No  . Sexual Activity: Not on file   Other Topics Concern  . Not on file   Social History Narrative  . No narrative on file    Review of Systems: Ten point ROS is O/W negative except as mentioned in HPI.  Physical Exam: Vital signs in last 24 hours: Temp:  [97.8 F (36.6 C)] 97.8 F (36.6 C) (05/27 1117) Pulse Rate:  [51-86] 70 (05/27 1500) Resp:  [12-21] 12 (05/27 1512) BP: (80-127)/(50-73) 127/65 mmHg (05/27 1512) SpO2:  [95 %-98 %] 98 % (05/27  1512) Weight:  [172 lb (78.019 kg)] 172 lb (78.019 kg) (05/27 1117)   General:  Alert, Well-developed, well-nourished, pleasant and cooperative in NAD Head:  Normocephalic and atraumatic. Eyes:  Sclera clear, no icterus.  Conjunctiva pink. Ears:  Normal auditory acuity. Mouth:  No deformity or lesions. Lungs:  Clear throughout to auscultation.  No wheezes, crackles, or rhonchi.  Heart:  Regular rate and rhythm; no murmurs, clicks, rubs, or gallops. Abdomen:  Soft, non-distended.  BS present.  Non-tender.  Previous laparotomy scar noted.   Rectal:  Deferred  Msk:  Symmetrical without gross deformities. Pulses:  Normal pulses noted. Extremities:  Without clubbing or edema. Neurologic:  Alert and  oriented x4;  grossly normal neurologically. Skin:  Intact without significant lesions or rashes. Psych:  Alert and cooperative. Normal mood and affect.   Intake/Output this shift: Total I/O In: 1000 [I.V.:1000] Out: -   Lab Results:  Recent Labs  03/20/14 1145  WBC 12.6*  HGB 15.3  HCT 43.7  PLT 201   BMET  Recent Labs  03/20/14 1145  NA 139  K 4.3  CL 105  CO2 20  GLUCOSE 111*  BUN 24*  CREATININE 1.49*  CALCIUM 9.0   LFT  Recent Labs  03/20/14 1145  PROT 6.8  ALBUMIN 3.5  AST 13  ALT 6  ALKPHOS 65  BILITOT 0.5   Studies/Results: Ct Head Wo Contrast  03/20/2014   CLINICAL DATA:  Syncope.  EXAM: CT HEAD WITHOUT CONTRAST  CT CERVICAL SPINE WITHOUT CONTRAST  TECHNIQUE: Multidetector CT imaging of the head and cervical spine was performed following the standard protocol without intravenous contrast. Multiplanar CT image reconstructions of the cervical spine were also generated.  COMPARISON:  CT head 10/15/2010.  FINDINGS: CT HEAD FINDINGS  Scattered hypodensities throughout the white matter compatible chronic ischemic changes, stable. Scattered low-density areas in the basal ganglia bilaterally, likely lacune or infarcts. No acute infarction. No hemorrhage or  hydrocephalus. No extra-axial fluid collection. No acute calvarial abnormality.  Visualized paranasal sinuses and mastoids clear. Orbital soft tissues unremarkable.  CT CERVICAL SPINE FINDINGS  Prior anterior fusion from C5-C7. No hardware complicating feature. Normal alignment. No fracture. No epidural or paraspinal hematoma.  IMPRESSION: No acute intracranial abnormality.  Prior anterior fusion C5-C7.  No acute bony abnormality.   Electronically Signed   By: Rolm Baptise M.D.   On: 03/20/2014 13:02   Ct Cervical Spine Wo Contrast  03/20/2014   CLINICAL DATA:  Syncope.  EXAM: CT HEAD WITHOUT CONTRAST  CT CERVICAL SPINE WITHOUT CONTRAST  TECHNIQUE: Multidetector CT imaging of the head and cervical spine was performed following the standard protocol without intravenous contrast. Multiplanar CT image reconstructions of the cervical spine were also generated.  COMPARISON:  CT head 10/15/2010.  FINDINGS: CT HEAD FINDINGS  Scattered hypodensities throughout the white matter compatible chronic ischemic changes, stable. Scattered low-density areas in the basal ganglia bilaterally, likely lacune or infarcts. No acute infarction. No hemorrhage or hydrocephalus. No extra-axial fluid collection. No acute calvarial abnormality.  Visualized paranasal sinuses and mastoids clear. Orbital soft tissues unremarkable.  CT CERVICAL SPINE FINDINGS  Prior anterior fusion from C5-C7. No hardware complicating feature. Normal alignment. No fracture. No epidural or paraspinal hematoma.  IMPRESSION: No acute intracranial abnormality.  Prior anterior fusion C5-C7.  No acute bony abnormality.   Electronically Signed   By: Rolm Baptise M.D.   On: 03/20/2014 13:02    IMPRESSION:  -Diarrhea, acute x 1 month on chronic:  ? Parasitic infection with increase in eosinophils.    -Syncopal episode/hypotension:  Improved with fluid resuscitation.     PLAN: -Await results of stool studies (Cdiff, O&P, and stool culture ordered). -Ok with full  liquid diet for now. -Will start him empirically on flagyl 250 mg four times daily.  Laban Emperor. Zehr  03/20/2014, 3:50 PM  Pager number [phone]

## 2014-03-20 NOTE — Consult Note (Signed)
Referring Provider: No ref. provider found Primary Care Physician:  Alesia Richards, MD Primary Gastroenterologist:  Dr. Deatra Ina  Reason for Consultation:  Diarrhea  HPI: John [name] is a 79 y.o. male who is known to Dr. Deatra Ina for previous colonoscopies and upper endoscopies.  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  PMH includes CAD with previous CABG, HTN, HLD, DM, renal stones and TIA's.  He came to the ED today with complaints of diarrhea and a syncopal episode. He says that in 1989 he was diagnosed with dysentery (stool was sent to the [facility]) and ever since then he has intermittent diarrhea, sometimes lasting a week at a time, but is usually controlled with prn Lomotil.  He says that 4 weeks ago he developed diarrhea and it has been persistent despite Lomotil, Imodium, and questran.  He has been getting up 20x a night with diarrhea. Last night he developed some abdominal pain/cramps and had a syncopal event when getting up to get a drink. He says that he called to see his GI doc Deatra Ina) but was unable to get an appointment until July.  Denies blood in stool, fevers, and chill.  Some nausea but no vomiting.  Denies recent antibiotic use and travel.  He has a leukocytosis of 12.6 with 22% eosinophils.  Lactic acid and lipase normal.  Slightly worse renal function from baseline.  Stool for Cdiff, culture, and O&P have been ordered.  Initially was hypotensive, but that was resolved with administration of IVF's.  Last colonoscopy was in 08/2013 at which time he had a 3 mm sessile polyp removed from the cecum that was a tubular adenoma.  Also had mild diverticulosis in the transverse, descending, and sigmoid colon.   Past Medical History  Diagnosis Date  . TIA   . SYNCOPE   . HYPERTENSION   . HYPERLIPIDEMIA   . CAD, ARTERY BYPASS GRAFT   . TRANSIENT ISCHEMIC ATTACKS, HX OF   .  SUPRAVENTRICULAR TACHYCARDIA, HX OF   . NEPHROLITHIASIS, HX OF   . Leukodystrophy   . GASTROESOPHAGEAL REFLUX DISEASE   . DIABETES MELLITUS, TYPE II   . ASTHMA   . Diverticulosis   . Hiatal hernia   . Insomnia   . Anxiety   . Arthritis     LEFT ANKLE  . Blood transfusion   . COPD     PT. DENIES  . Stroke     MINI 4/5 YEARS AGO  . Ulcer   . Colon polyps     adenomatous polyps    Past Surgical History  Procedure Laterality Date  . Coronary artery bypass graft  1996  . Cardiac catheterization    . Gastrectomy    . Vagotomy    . Cystourethroscopy with stent removal.    . Sebaceous cyst removal    . Colonoscopy    . Upper gastrointestinal endoscopy    . Hemorrhoid surgery    . Polypectomy      Prior to Admission medications   Medication Sig Start Date End Date Taking? Authorizing Provider  ALPRAZolam Duanne Moron) 1 MG tablet Take 1 mg by mouth at bedtime.    Yes Historical Provider, MD  aspirin 81 MG tablet Take 81 mg by mouth daily.     Yes Historical Provider, MD  Cholecalciferol (VITAMIN D3) 2000 UNITS capsule Take 2,000 Units by mouth daily.     Yes Historical Provider, MD  clopidogrel (PLAVIX) 75 MG tablet  Take 75 mg by mouth daily.     Yes Historical Provider, MD  diltiazem (CARDIZEM CD) 120 MG 24 hr capsule Take 1 capsule (120 mg total) by mouth daily. 08/14/13  Yes Burnell Blanks, MD  glucose blood (FREESTYLE LITE) test strip 1 each by Other route as needed for other. Use as instructed   Yes Historical Provider, MD  Lancets (FREESTYLE) lancets 1 each by Other route as needed for other. Use as instructed   Yes Historical Provider, MD  loperamide (IMODIUM A-D) 2 MG tablet Take 2 mg by mouth 4 (four) times daily as needed for diarrhea or loose stools.   Yes Historical Provider, MD  LOTEMAX 0.5 % GEL Apply 1 drop to eye daily.  07/09/13  Yes Historical Provider, MD  nitroGLYCERIN (NITROSTAT) 0.4 MG SL tablet Sig: 1 tablet under tongue every 3 to 5 minutes as needed for  Angina - Please Dispense # 2 bottles of # 25 tabs 11/08/13  Yes Unk Pinto, MD  pantoprazole (PROTONIX) 40 MG tablet Take 1 tablet (40 mg total) by mouth 2 (two) times daily. 01/18/14  Yes Unk Pinto, MD  potassium citrate (UROCIT-K) 10 MEQ (1080 MG) SR tablet Take 10 mEq by mouth 2 (two) times daily.   Yes Historical Provider, MD  promethazine (PHENERGAN) 25 MG tablet Take 25 mg by mouth every 8 (eight) hours as needed for nausea.  07/18/12  Yes Historical Provider, MD  QUEtiapine (SEROQUEL) 25 MG tablet Take 1 tablet (25 mg total) by mouth 3 (three) times daily. 11/08/13  Yes Unk Pinto, MD  simvastatin (ZOCOR) 80 MG tablet Take 40 mg by mouth at bedtime. Takes 1/2 tab=40mg  09/27/13  Yes Unk Pinto, MD  PREVALITE 4 G packet Take 1 packet by mouth 2 (two) times daily. 03/06/14   Historical Provider, MD    No current facility-administered medications for this encounter.   Current Outpatient Prescriptions  Medication Sig Dispense Refill  . ALPRAZolam (XANAX) 1 MG tablet Take 1 mg by mouth at bedtime.       [name] aspirin 81 MG tablet Take 81 mg by mouth daily.        . Cholecalciferol (VITAMIN D3) 2000 UNITS capsule Take 2,000 Units by mouth daily.        . clopidogrel (PLAVIX) 75 MG tablet Take 75 mg by mouth daily.        [name] diltiazem (CARDIZEM CD) 120 MG 24 hr capsule Take 1 capsule (120 mg total) by mouth daily.  90 capsule  3  . glucose blood (FREESTYLE LITE) test strip 1 each by Other route as needed for other. Use as instructed      . Lancets (FREESTYLE) lancets 1 each by Other route as needed for other. Use as instructed      . loperamide (IMODIUM A-D) 2 MG tablet Take 2 mg by mouth 4 (four) times daily as needed for diarrhea or loose stools.      [name] LOTEMAX 0.5 % GEL Apply 1 drop to eye daily.       . nitroGLYCERIN (NITROSTAT) 0.4 MG SL tablet Sig: 1 tablet under tongue every 3 to 5 minutes as needed for Angina - Please Dispense # 2 bottles of # 25 tabs  50 tablet  99  .  pantoprazole (PROTONIX) 40 MG tablet Take 1 tablet (40 mg total) by mouth 2 (two) times daily.  180 tablet  4  . potassium citrate (UROCIT-K) 10 MEQ (1080 MG) SR tablet Take 10 mEq by mouth  2 (two) times daily.      . promethazine (PHENERGAN) 25 MG tablet Take 25 mg by mouth every 8 (eight) hours as needed for nausea.       [name] QUEtiapine (SEROQUEL) 25 MG tablet Take 1 tablet (25 mg total) by mouth 3 (three) times daily.  90 tablet  99  . simvastatin (ZOCOR) 80 MG tablet Take 40 mg by mouth at bedtime. Takes 1/2 tab=40mg       . PREVALITE 4 G packet Take 1 packet by mouth 2 (two) times daily.        Allergies as of 03/20/2014 - Review Complete 03/20/2014  Allergen Reaction Noted  . Beta adrenergic blockers  03/12/2009  . Gabapentin  10/04/2013  . Prozac [fluoxetine hcl]  10/22/2013  . Soma [carisoprodol]  10/22/2013    Family History  Problem Relation Age of Onset  . Heart disease Brother   . Diabetes Brother   . Alcohol abuse Father   . Heart disease Paternal Grandmother   . Colon cancer Neg Hx   . Stomach cancer Neg Hx     History   Social History  . Marital Status: Married    Spouse Name: N/A    Number of Children: 2  . Years of Education: N/A   Occupational History  . retired-maintenance dept at [employer]  . Smoking status: Former Smoker    Types: Cigarettes    Quit date: 10/25/1962  . Smokeless tobacco: Former Systems developer    Quit date: 10/25/1962  . Alcohol Use: No  . Drug Use: No  . Sexual Activity: Not on file   Other Topics Concern  . Not on file   Social History Narrative  . No narrative on file    Review of Systems: Ten point ROS is O/W negative except as mentioned in HPI.  Physical Exam: Vital signs in last 24 hours: Temp:  [97.8 F (36.6 C)] 97.8 F (36.6 C) (05/27 1117) Pulse Rate:  [51-86] 70 (05/27 1500) Resp:  [12-21] 12 (05/27 1512) BP: (80-127)/(50-73) 127/65 mmHg (05/27 1512) SpO2:  [95 %-98 %] 98 % (05/27  1512) Weight:  [172 lb (78.019 kg)] 172 lb (78.019 kg) (05/27 1117)   General:  Alert, Well-developed, well-nourished, pleasant and cooperative in NAD Head:  Normocephalic and atraumatic. Eyes:  Sclera clear, no icterus.  Conjunctiva pink. Ears:  Normal auditory acuity. Mouth:  No deformity or lesions. Lungs:  Clear throughout to auscultation.  No wheezes, crackles, or rhonchi.  Heart:  Regular rate and rhythm; no murmurs, clicks, rubs, or gallops. Abdomen:  Soft, non-distended.  BS present.  Non-tender.  Previous laparotomy scar noted.   Rectal:  Deferred  Msk:  Symmetrical without gross deformities. Pulses:  Normal pulses noted. Extremities:  Without clubbing or edema. Neurologic:  Alert and  oriented x4;  grossly normal neurologically. Skin:  Intact without significant lesions or rashes. Psych:  Alert and cooperative. Normal mood and affect.   Intake/Output this shift: Total I/O In: 1000 [I.V.:1000] Out: -   Lab Results:  Recent Labs  03/20/14 1145  WBC 12.6*  HGB 15.3  HCT 43.7  PLT 201   BMET  Recent Labs  03/20/14 1145  NA 139  K 4.3  CL 105  CO2 20  GLUCOSE 111*  BUN 24*  CREATININE 1.49*  CALCIUM 9.0   LFT  Recent Labs  03/20/14 1145  PROT 6.8  ALBUMIN 3.5  AST 13  ALT 6  ALKPHOS 65  BILITOT 0.5   Studies/Results: Ct Head Wo Contrast  03/20/2014   CLINICAL DATA:  Syncope.  EXAM: CT HEAD WITHOUT CONTRAST  CT CERVICAL SPINE WITHOUT CONTRAST  TECHNIQUE: Multidetector CT imaging of the head and cervical spine was performed following the standard protocol without intravenous contrast. Multiplanar CT image reconstructions of the cervical spine were also generated.  COMPARISON:  CT head 10/15/2010.  FINDINGS: CT HEAD FINDINGS  Scattered hypodensities throughout the white matter compatible chronic ischemic changes, stable. Scattered low-density areas in the basal ganglia bilaterally, likely lacune or infarcts. No acute infarction. No hemorrhage or  hydrocephalus. No extra-axial fluid collection. No acute calvarial abnormality.  Visualized paranasal sinuses and mastoids clear. Orbital soft tissues unremarkable.  CT CERVICAL SPINE FINDINGS  Prior anterior fusion from C5-C7. No hardware complicating feature. Normal alignment. No fracture. No epidural or paraspinal hematoma.  IMPRESSION: No acute intracranial abnormality.  Prior anterior fusion C5-C7.  No acute bony abnormality.   Electronically Signed   By: Rolm Baptise M.D.   On: 03/20/2014 13:02   Ct Cervical Spine Wo Contrast  03/20/2014   CLINICAL DATA:  Syncope.  EXAM: CT HEAD WITHOUT CONTRAST  CT CERVICAL SPINE WITHOUT CONTRAST  TECHNIQUE: Multidetector CT imaging of the head and cervical spine was performed following the standard protocol without intravenous contrast. Multiplanar CT image reconstructions of the cervical spine were also generated.  COMPARISON:  CT head 10/15/2010.  FINDINGS: CT HEAD FINDINGS  Scattered hypodensities throughout the white matter compatible chronic ischemic changes, stable. Scattered low-density areas in the basal ganglia bilaterally, likely lacune or infarcts. No acute infarction. No hemorrhage or hydrocephalus. No extra-axial fluid collection. No acute calvarial abnormality.  Visualized paranasal sinuses and mastoids clear. Orbital soft tissues unremarkable.  CT CERVICAL SPINE FINDINGS  Prior anterior fusion from C5-C7. No hardware complicating feature. Normal alignment. No fracture. No epidural or paraspinal hematoma.  IMPRESSION: No acute intracranial abnormality.  Prior anterior fusion C5-C7.  No acute bony abnormality.   Electronically Signed   By: Rolm Baptise M.D.   On: 03/20/2014 13:02    IMPRESSION:  -Diarrhea, acute x 1 month on chronic:  ? Parasitic infection with increase in eosinophils.    -Syncopal episode/hypotension:  Improved with fluid resuscitation.     PLAN: -Await results of stool studies (Cdiff, O&P, and stool culture ordered). -Ok with full  liquid diet for now. -Will start him empirically on flagyl 250 mg four times daily.  Laban Emperor. Zehr  03/20/2014, 3:50 PM  Pager number [phone]   GI ATTENDING  HISTORY,LABS,X-RAYS,PRIOR COLONOSCOPY AND ENDOSCOPY REPORTS REVIEWED. Agree with H&P as outlined above. Patient with multiple medical problems. Admitted with 4 weeks of diarrhea and dehydration. Mild abdominal pain (cramping) without bleeding. Mild elevation WBC with eosinophilia. Worsening renal function. Poorly characterized history of recurrent diarrhea treated symptomatically. Colonoscopy 7 months ago with normal mucosa. Possible causes include infection (C diff, giardia, etc), eosinophilic gastroenteritis, microscopic colitis, other. Agree with rehydration, stool studies, and empiric flagyl. Will follow. Thanks   Docia Chuck. Geri Seminole., M.D. [facility] Division of Gastroenterology

## 2014-03-20 NOTE — ED Notes (Signed)
Lactic acid results given to dr. Mingo Amber

## 2014-03-20 NOTE — H&P (Signed)
[facility] History and Physical  John [name] GHW:[id number] DOB: 04/18/1935 DOA: 03/20/2014  Referring physician: er PCP: John Richards, MD   Chief Complaint: diarrhea  HPI: John [name] is a 79 y.o. male  Who developed diarrhea 1 months ago.  He has been getting up 20x a night with diarrhea.  Last night he developed some abdominal pain and had a syncopal event when getting up to get a drink.  He called to see his GI doc Deatra Ina) but was unable to get an appointment.  So he came to the ER.  No fever, no chills, no new meds. In 1989 he was diagnosed with dysentery (stool was sent to [facility]) His PCP attributed his diarrhea to his diverticula.  He does endorse nausea and fecal leakage.   colonoscopy 08/2013 showed diverticulosis   In the Er, his labs showed a +WBC but no other   Review of Systems:  All systems reviewed, negative unless stated above  Past Medical History  Diagnosis Date  . TIA   . SYNCOPE   . HYPERTENSION   . HYPERLIPIDEMIA   . CAD, ARTERY BYPASS GRAFT   . TRANSIENT ISCHEMIC ATTACKS, HX OF   . SUPRAVENTRICULAR TACHYCARDIA, HX OF   . NEPHROLITHIASIS, HX OF   . Leukodystrophy   . GASTROESOPHAGEAL REFLUX DISEASE   . DIABETES MELLITUS, TYPE II   . ASTHMA   . Diverticulosis   . Hiatal hernia   . Insomnia   . Anxiety   . Arthritis     LEFT ANKLE  . Blood transfusion   . COPD     PT. DENIES  . Stroke     MINI 4/5 YEARS AGO  . Ulcer   . Colon polyps     adenomatous polyps   Past Surgical History  Procedure Laterality Date  . Coronary artery bypass graft  1996  . Cardiac catheterization    . Gastrectomy    . Vagotomy    . Cystourethroscopy with stent removal.    . Sebaceous cyst removal    . Colonoscopy    . Upper gastrointestinal endoscopy    . Hemorrhoid surgery    . Polypectomy     Social History:  reports that he quit smoking about 51 years ago. His smoking use included Cigarettes. He smoked 0.00 packs per day. He quit  smokeless tobacco use about 51 years ago. He reports that he does not drink alcohol or use illicit drugs.  Allergies  Allergen Reactions  . Beta Adrenergic Blockers     REACTION: Bradycardia  . Gabapentin     Severe confusion  . Prozac [Fluoxetine Hcl]   . Soma [Carisoprodol]     Family History  Problem Relation Age of Onset  . Heart disease Brother   . Diabetes Brother   . Alcohol abuse Father   . Heart disease Paternal Grandmother   . Colon cancer Neg Hx   . Stomach cancer Neg Hx      Prior to Admission medications   Medication Sig Start Date End Date Taking? Authorizing Provider  ALPRAZolam Duanne Moron) 1 MG tablet Take 1 mg by mouth at bedtime.    Yes Historical Provider, MD  aspirin 81 MG tablet Take 81 mg by mouth daily.     Yes Historical Provider, MD  Cholecalciferol (VITAMIN D3) 2000 UNITS capsule Take 2,000 Units by mouth daily.     Yes Historical Provider, MD  clopidogrel (PLAVIX) 75 MG tablet Take 75 mg by mouth daily.  Yes Historical Provider, MD  diltiazem (CARDIZEM CD) 120 MG 24 hr capsule Take 1 capsule (120 mg total) by mouth daily. 08/14/13  Yes Burnell Blanks, MD  glucose blood (FREESTYLE LITE) test strip 1 each by Other route as needed for other. Use as instructed   Yes Historical Provider, MD  Lancets (FREESTYLE) lancets 1 each by Other route as needed for other. Use as instructed   Yes Historical Provider, MD  loperamide (IMODIUM A-D) 2 MG tablet Take 2 mg by mouth 4 (four) times daily as needed for diarrhea or loose stools.   Yes Historical Provider, MD  LOTEMAX 0.5 % GEL Apply 1 drop to eye daily.  07/09/13  Yes Historical Provider, MD  nitroGLYCERIN (NITROSTAT) 0.4 MG SL tablet Sig: 1 tablet under tongue every 3 to 5 minutes as needed for Angina - Please Dispense # 2 bottles of # 25 tabs 11/08/13  Yes Unk Pinto, MD  pantoprazole (PROTONIX) 40 MG tablet Take 1 tablet (40 mg total) by mouth 2 (two) times daily. 01/18/14  Yes Unk Pinto, MD    potassium citrate (UROCIT-K) 10 MEQ (1080 MG) SR tablet Take 10 mEq by mouth 2 (two) times daily.   Yes Historical Provider, MD  promethazine (PHENERGAN) 25 MG tablet Take 25 mg by mouth every 8 (eight) hours as needed for nausea.  07/18/12  Yes Historical Provider, MD  QUEtiapine (SEROQUEL) 25 MG tablet Take 1 tablet (25 mg total) by mouth 3 (three) times daily. 11/08/13  Yes Unk Pinto, MD  simvastatin (ZOCOR) 80 MG tablet Take 40 mg by mouth at bedtime. Takes 1/2 tab=40mg  09/27/13  Yes Unk Pinto, MD  PREVALITE 4 G packet Take 1 packet by mouth 2 (two) times daily. 03/06/14   Historical Provider, MD   Physical Exam: Filed Vitals:   03/20/14 1512  BP: 127/65  Pulse:   Temp:   Resp: 12    BP 127/65  Pulse 70  Temp(Src) 97.8 F (36.6 C) (Oral)  Resp 12  Ht 5\' 11"  (1.803 m)  Wt 78.019 kg (172 lb)  BMI 24.00 kg/m2  SpO2 98%  General:  Appears calm and comfortable Eyes: PERRL, normal lids, irises & conjunctiva ENT: grossly normal hearing, lips & tongue Neck: no LAD, masses or thyromegaly Cardiovascular: RRR, no m/r/g. No LE edema. Telemetry: SR, no arrhythmias  Respiratory: CTA bilaterally, no w/r/r. Normal respiratory effort. Abdomen: soft, ntnd Skin: no rash or induration seen on limited exam Musculoskeletal: grossly normal tone BUE/BLE Psychiatric: grossly normal mood and affect, speech fluent and appropriate Neurologic: grossly non-focal.          Labs on Admission:  Basic Metabolic Panel:  Recent Labs Lab 03/20/14 1145  NA 139  K 4.3  CL 105  CO2 20  GLUCOSE 111*  BUN 24*  CREATININE 1.49*  CALCIUM 9.0   Liver Function Tests:  Recent Labs Lab 03/20/14 1145  AST 13  ALT 6  ALKPHOS 65  BILITOT 0.5  PROT 6.8  ALBUMIN 3.5    Recent Labs Lab 03/20/14 1145  LIPASE 12   No results found for this basename: AMMONIA,  in the last 168 hours CBC:  Recent Labs Lab 03/20/14 1145  WBC 12.6*  NEUTROABS 6.7  HGB 15.3  HCT 43.7  MCV 91.8   PLT 201   Cardiac Enzymes: No results found for this basename: CKTOTAL, CKMB, CKMBINDEX, TROPONINI,  in the last 168 hours  BNP (last 3 results) No results found for this basename: PROBNP,  in  the last 8760 hours CBG:  Recent Labs Lab 03/20/14 1233  GLUCAP 94    Radiological Exams on Admission: Ct Head Wo Contrast  03/20/2014   CLINICAL DATA:  Syncope.  EXAM: CT HEAD WITHOUT CONTRAST  CT CERVICAL SPINE WITHOUT CONTRAST  TECHNIQUE: Multidetector CT imaging of the head and cervical spine was performed following the standard protocol without intravenous contrast. Multiplanar CT image reconstructions of the cervical spine were also generated.  COMPARISON:  CT head 10/15/2010.  FINDINGS: CT HEAD FINDINGS  Scattered hypodensities throughout the white matter compatible chronic ischemic changes, stable. Scattered low-density areas in the basal ganglia bilaterally, likely lacune or infarcts. No acute infarction. No hemorrhage or hydrocephalus. No extra-axial fluid collection. No acute calvarial abnormality.  Visualized paranasal sinuses and mastoids clear. Orbital soft tissues unremarkable.  CT CERVICAL SPINE FINDINGS  Prior anterior fusion from C5-C7. No hardware complicating feature. Normal alignment. No fracture. No epidural or paraspinal hematoma.  IMPRESSION: No acute intracranial abnormality.  Prior anterior fusion C5-C7.  No acute bony abnormality.   Electronically Signed   By: Rolm Baptise M.D.   On: 03/20/2014 13:02   Ct Cervical Spine Wo Contrast  03/20/2014   CLINICAL DATA:  Syncope.  EXAM: CT HEAD WITHOUT CONTRAST  CT CERVICAL SPINE WITHOUT CONTRAST  TECHNIQUE: Multidetector CT imaging of the head and cervical spine was performed following the standard protocol without intravenous contrast. Multiplanar CT image reconstructions of the cervical spine were also generated.  COMPARISON:  CT head 10/15/2010.  FINDINGS: CT HEAD FINDINGS  Scattered hypodensities throughout the white matter  compatible chronic ischemic changes, stable. Scattered low-density areas in the basal ganglia bilaterally, likely lacune or infarcts. No acute infarction. No hemorrhage or hydrocephalus. No extra-axial fluid collection. No acute calvarial abnormality.  Visualized paranasal sinuses and mastoids clear. Orbital soft tissues unremarkable.  CT CERVICAL SPINE FINDINGS  Prior anterior fusion from C5-C7. No hardware complicating feature. Normal alignment. No fracture. No epidural or paraspinal hematoma.  IMPRESSION: No acute intracranial abnormality.  Prior anterior fusion C5-C7.  No acute bony abnormality.   Electronically Signed   By: Rolm Baptise M.D.   On: 03/20/2014 13:02    EKG: Independently reviewed. Sinus with PAC  Assessment/Plan Active Problems:   HYPERTENSION   Gastric AV malformation   Hypotension   Diarrhea   Syncope   Diarrhea- 1 month long- stool studies, GI consult, full liquids  Hypotensive leading to a syncopal event- tele, IVF, monitor blood pressures- holding parameters on BP meds  HTN- see above  leukocytosis monitor (recent steroid use)  H/o diverticulosis and AV malformations- GI consult    GI consult Deatra Ina is GI doc)  Code Status: full Family Communication: patient and wife at bedside Disposition Plan: obs  Time spent: 11 min  [facility] Pager [phone]

## 2014-03-21 DIAGNOSIS — A0472 Enterocolitis due to Clostridium difficile, not specified as recurrent: Secondary | ICD-10-CM | POA: Diagnosis present

## 2014-03-21 LAB — COMPREHENSIVE METABOLIC PANEL
ALT: 7 U/L (ref 0–53)
AST: 11 U/L (ref 0–37)
Albumin: 2.7 g/dL — ABNORMAL LOW (ref 3.5–5.2)
Alkaline Phosphatase: 55 U/L (ref 39–117)
BUN: 16 mg/dL (ref 6–23)
CO2: 21 mEq/L (ref 19–32)
Calcium: 8.4 mg/dL (ref 8.4–10.5)
Chloride: 112 mEq/L (ref 96–112)
Creatinine, Ser: 1.28 mg/dL (ref 0.50–1.35)
GFR calc Af Amer: 61 mL/min — ABNORMAL LOW (ref >90)
GFR calc non Af Amer: 52 mL/min — ABNORMAL LOW (ref >90)
Glucose, Bld: 82 mg/dL (ref 70–99)
Potassium: 4.4 mEq/L (ref 3.7–5.3)
Sodium: 142 mEq/L (ref 137–147)
Total Bilirubin: 0.5 mg/dL (ref 0.3–1.2)
Total Protein: 5.3 g/dL — ABNORMAL LOW (ref 6.0–8.3)

## 2014-03-21 LAB — CBC
HCT: 37.4 % — ABNORMAL LOW (ref 39.0–52.0)
Hemoglobin: 12.6 g/dL — ABNORMAL LOW (ref 13.0–17.0)
MCH: 31.4 pg (ref 26.0–34.0)
MCHC: 33.7 g/dL (ref 30.0–36.0)
MCV: 93.3 fL (ref 78.0–100.0)
Platelets: 177 10*3/uL (ref 150–400)
RBC: 4.01 MIL/uL — ABNORMAL LOW (ref 4.22–5.81)
RDW: 14 % (ref 11.5–15.5)
WBC: 9.7 10*3/uL (ref 4.0–10.5)

## 2014-03-21 LAB — CLOSTRIDIUM DIFFICILE BY PCR: Toxigenic C. Difficile by PCR: POSITIVE — AB

## 2014-03-21 LAB — OVA AND PARASITE EXAMINATION
OVA AND PARASITES: NONE SEEN
SPECIAL REQUESTS: NORMAL

## 2014-03-21 MED ORDER — FAMOTIDINE 20 MG PO TABS
20.0000 mg | ORAL_TABLET | Freq: Two times a day (BID) | ORAL | Status: DC
  Administered 2014-03-21 – 2014-03-24 (×7): 20 mg via ORAL
  Filled 2014-03-21 (×8): qty 1

## 2014-03-21 MED ORDER — CALCIUM CARBONATE ANTACID 500 MG PO CHEW
1.0000 | CHEWABLE_TABLET | Freq: Three times a day (TID) | ORAL | Status: DC | PRN
  Filled 2014-03-21: qty 1

## 2014-03-21 MED ORDER — SODIUM CHLORIDE 0.9 % IV SOLN: 1000.0000 mL | INTRAVENOUS | Status: DC

## 2014-03-21 MED ORDER — METRONIDAZOLE 500 MG PO TABS
500.0000 mg | ORAL_TABLET | Freq: Three times a day (TID) | ORAL | Status: DC
  Administered 2014-03-21 – 2014-03-24 (×10): 500 mg via ORAL
  Filled 2014-03-21 (×13): qty 1

## 2014-03-21 MED ORDER — SODIUM CHLORIDE 0.9 % IV SOLN
INTRAVENOUS | Status: DC
  Administered 2014-03-22 – 2014-03-24 (×4): via INTRAVENOUS
  Filled 2014-03-21 (×6): qty 1000

## 2014-03-21 NOTE — Progress Notes (Signed)
  I have directly reviewed the clinical findings, lab, imaging studies and management of this patient in detail. I have interviewed and examined the patient and agree with the documentation,  as recorded by the Physician extender.  Thurnell Lose M.D on 03/21/2014 at 2:16 PM  [facility] Office  [phone]

## 2014-03-21 NOTE — Progress Notes (Signed)
[facility] Progress Note  Subjective:  Still with a lot of diarrhea.  On a full liquid diet.   Objective:  Vital signs in last 24 hours: Temp:  [97.5 F (36.4 C)-97.8 F (36.6 C)] 97.5 F (36.4 C) (05/28 0425) Pulse Rate:  [51-86] 60 (05/28 0425) Resp:  [12-21] 16 (05/28 0425) BP: (80-127)/(50-73) 110/60 mmHg (05/28 0425) SpO2:  [95 %-99 %] 96 % (05/28 0425) Weight:  [165 lb 12.8 oz (75.206 kg)-172 lb (78.019 kg)] 165 lb 12.8 oz (75.206 kg) (05/27 1545) Last BM Date: 03/20/14 General:  Alert, Well-developed, in NAD Heart:  Regular rate and rhythm; no murmurs Pulm:  CTAB.  No W/R/R. Abdomen:  Soft, non-distended. Normal bowel sounds.  Previous laparotomy scar noted.  Non-tender. Extremities:  Without edema. Neurologic:  Alert and  oriented x4;  grossly normal neurologically. Psych:  Alert and cooperative. Normal mood and affect.  Intake/Output from previous day: 05/27 0701 - 05/28 0700 In: 1000 [I.V.:1000] Out: 800 [Urine:800]  Lab Results:  Recent Labs  03/20/14 1145 03/21/14 0554  WBC 12.6* 9.7  HGB 15.3 12.6*  HCT 43.7 37.4*  PLT 201 177   BMET  Recent Labs  03/20/14 1145 03/21/14 0554  NA 139 142  K 4.3 4.4  CL 105 112  CO2 20 21  GLUCOSE 111* 82  BUN 24* 16  CREATININE 1.49* 1.28  CALCIUM 9.0 8.4   LFT  Recent Labs  03/21/14 0554  PROT 5.3*  ALBUMIN 2.7*  AST 11  ALT 7  ALKPHOS 55  BILITOT 0.5   Ct Head Wo Contrast  03/20/2014   CLINICAL DATA:  Syncope.  EXAM: CT HEAD WITHOUT CONTRAST  CT CERVICAL SPINE WITHOUT CONTRAST  TECHNIQUE: Multidetector CT imaging of the head and cervical spine was performed following the standard protocol without intravenous contrast. Multiplanar CT image reconstructions of the cervical spine were also generated.  COMPARISON:  CT head 10/15/2010.  FINDINGS: CT HEAD FINDINGS  Scattered hypodensities throughout the white matter compatible chronic ischemic changes, stable. Scattered low-density areas  in the basal ganglia bilaterally, likely lacune or infarcts. No acute infarction. No hemorrhage or hydrocephalus. No extra-axial fluid collection. No acute calvarial abnormality.  Visualized paranasal sinuses and mastoids clear. Orbital soft tissues unremarkable.  CT CERVICAL SPINE FINDINGS  Prior anterior fusion from C5-C7. No hardware complicating feature. Normal alignment. No fracture. No epidural or paraspinal hematoma.  IMPRESSION: No acute intracranial abnormality.  Prior anterior fusion C5-C7.  No acute bony abnormality.   Electronically Signed   By: Rolm Baptise M.D.   On: 03/20/2014 13:02   Ct Cervical Spine Wo Contrast  03/20/2014   CLINICAL DATA:  Syncope.  EXAM: CT HEAD WITHOUT CONTRAST  CT CERVICAL SPINE WITHOUT CONTRAST  TECHNIQUE: Multidetector CT imaging of the head and cervical spine was performed following the standard protocol without intravenous contrast. Multiplanar CT image reconstructions of the cervical spine were also generated.  COMPARISON:  CT head 10/15/2010.  FINDINGS: CT HEAD FINDINGS  Scattered hypodensities throughout the white matter compatible chronic ischemic changes, stable. Scattered low-density areas in the basal ganglia bilaterally, likely lacune or infarcts. No acute infarction. No hemorrhage or hydrocephalus. No extra-axial fluid collection. No acute calvarial abnormality.  Visualized paranasal sinuses and mastoids clear. Orbital soft tissues unremarkable.  CT CERVICAL SPINE FINDINGS  Prior anterior fusion from C5-C7. No hardware complicating feature. Normal alignment. No fracture. No epidural or paraspinal hematoma.  IMPRESSION: No acute intracranial abnormality.  Prior anterior fusion C5-C7.  No  acute bony abnormality.   Electronically Signed   By: Rolm Baptise M.D.   On: 03/20/2014 13:02   Acute Abdominal Series  03/21/2014   CLINICAL DATA:  Abdominal pain.  EXAM: ACUTE ABDOMEN SERIES (ABDOMEN 2 VIEW & CHEST 1 VIEW)  COMPARISON:  CT of the abdomen and pelvis  performed 07/18/2012, and chest radiograph from 12/20/2011  FINDINGS: The lungs are well-aerated. Mild left basilar opacity may reflect atelectasis or possibly mild pneumonia. There is no evidence of pleural effusion or pneumothorax. The cardiomediastinal silhouette is borderline normal in size. The patient is status post median sternotomy. Cervical spinal fusion hardware is noted.  The visualized bowel gas pattern is unremarkable. The colon is largely filled with air; there is no evidence of small bowel dilatation to suggest obstruction. No free intra-abdominal air is identified on the provided upright view.  No acute osseous abnormalities are seen; the sacroiliac joints are unremarkable in appearance.  IMPRESSION: 1. Unremarkable bowel gas pattern; no free intra-abdominal air seen. The colon is largely filled with air, without evidence of significant dilatation. 2. Mild left basilar airspace opacity may reflect atelectasis or possibly mild pneumonia.   Electronically Signed   By: Garald Balding M.D.   On: 03/21/2014 06:05    Assessment / Plan: -Diarrhea, acute x 1 month on chronic:  Patient acutely has Cdiff.  O&P negative. -Syncopal episode/hypotension: Improved with fluid resuscitation.   -Will continue flagyl 250 mg four times daily.   LOS: 1 day   John [name]  03/21/2014, 9:34 AM  Pager number [phone]   GI ATTENDING  Interval history and data reviewed. Patient personally seen and examined. Agree with H&P as above.  IMPRESSION 1. Clostridium difficile associated diarrhea. Slightly improved already on metronidazole 2. Dehydration secondary to above. Improved with rehydration  RECOMMENDATIONS 1. Metronidazole 2 and 50 mg by mouth 4 times a day x2 weeks. 2. Advance diet as tolerated 3. Discharge home when you feel he is reasonably better. Generally takes 24-96 hours to see improvement. Patient advised regarding risk of relapse with this condition. He is aware.  We're available as  needed. Will sign off.  Docia Chuck. Geri Seminole., M.D. [facility] Division of Gastroenterology

## 2014-03-21 NOTE — Progress Notes (Signed)
PROGRESS NOTE  John [name] DJS:[id number] DOB: April 10, 1935 DOA: 03/20/2014 PCP: John Richards, MD  John [name] is a 79 y.o. male who developed diarrhea 1 month ago. He has been getting up 20x a night with diarrhea. Last night he developed some abdominal pain and had a syncopal event when getting up to get a drink.  No fever, no chills, no new meds. In 1989 he was diagnosed with dysentery (stool was sent to [facility]).  His PCP attributed his diarrhea to his diverticula. He does endorse nausea and fecal leakage. Colonoscopy 08/2013 showed diverticulosis   Assessment/Plan:  C. difficile diarrhea Placed on oral Flagyl. Discontinue Protonix. Start Pepcid and TUMS GI on board.  Appreciate their consultation.  Syncopal episode Secondary to orthostasis from diarrhea Improved with IV fluids Checking orthostatics.  Elevated creatinine Improved with IV fluids. Has a history of kidney stones and likely some level of chronic kidney disease.  Type 2 diabetes CBGs well controlled by diet Hemoglobin A1c was 5.9 on 02/06/2014  Hyperlipidemia Continue simvastatin  Hypertension Continue diltiazem with hold parameters  History of coronary artery disease and TIA On aspirin and Plavix at home Currently receiving only Plavix inpatient.   DVT Prophylaxis:  Lovenox  Code Status: Full Family Communication: Patient is alert and oriented Disposition Plan: To home when able   Consultants:  [facility]     Antibiotics: Anti-infectives   Start     Dose/Rate Route Frequency Ordered Stop   03/21/14 0930  metroNIDAZOLE (FLAGYL) tablet 500 mg     500 mg Oral 3 times per day 03/21/14 0818     03/20/14 1800  metroNIDAZOLE (FLAGYL) tablet 250 mg  Status:  Discontinued     250 mg Oral 4 times per day 03/20/14 1622 03/21/14 0818    HPI/Subjective: No complaints. Describes increased diarrhea with eating.  Reports approximately 40 liquid  bowel movements in 24  hours  Objective: Filed Vitals:   03/20/14 1545 03/20/14 2036 03/21/14 0425 03/21/14 1023  BP: 103/61 120/56 110/60   Pulse: 60 60 60   Temp: 97.7 F (36.5 C) 97.8 F (36.6 C) 97.5 F (36.4 C) 97.6 F (36.4 C)  TempSrc: Oral Oral Oral Oral  Resp: 18 16 16 18   Height: 5' 11.5" (1.816 m)     Weight: 75.206 kg (165 lb 12.8 oz)     SpO2: 99% 96% 96% 95%    Intake/Output Summary (Last 24 hours) at 03/21/14 1259 Last data filed at 03/21/14 0900  Gross per 24 hour  Intake   1480 ml  Output    800 ml  Net    680 ml   Filed Weights   03/20/14 1117 03/20/14 1545  Weight: 78.019 kg (172 lb) 75.206 kg (165 lb 12.8 oz)    Exam: General: Well developed, well nourished, NAD, appears stated age  HEENT:   Anicteic Sclera, MMM. No pharyngeal erythema or exudates  Neck: Supple, no JVD, no masses  Cardiovascular: RRR, S1 S2 auscultated, no rubs, murmurs or gallops.   Respiratory: Clear to auscultation bilaterally with equal chest rise  Abdomen: Soft, nontender, nondistended, + bowel sounds, central hernia.  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  Neuro: AAOx3, cranial nerves grossly intact. Strength 5/5 in upper and lower extremities  Skin: Without rashes exudates or nodules.   Psych: Normal affect and demeanor with intact judgement and insight    Data Reviewed: Basic Metabolic Panel:  Recent Labs Lab 03/20/14 1145  03/21/14 0554  NA 139 142  K 4.3 4.4  CL 105 112  CO2 20 21  GLUCOSE 111* 82  BUN 24* 16  CREATININE 1.49* 1.28  CALCIUM 9.0 8.4   Liver Function Tests:  Recent Labs Lab 03/20/14 1145 03/21/14 0554  AST 13 11  ALT 6 7  ALKPHOS 65 55  BILITOT 0.5 0.5  PROT 6.8 5.3*  ALBUMIN 3.5 2.7*    Recent Labs Lab 03/20/14 1145  LIPASE 12   CBC:  Recent Labs Lab 03/20/14 1145 03/21/14 0554  WBC 12.6* 9.7  NEUTROABS 6.7  --   HGB 15.3 12.6*  HCT 43.7 37.4*  MCV 91.8 93.3  PLT 201 177    CBG:  Recent Labs Lab 03/20/14 1233  GLUCAP 94    Recent Results (from the past 240 hour(s))  CLOSTRIDIUM DIFFICILE BY PCR     Status: Abnormal   Collection Time    03/20/14  8:01 PM      Result Value Ref Range Status   C difficile by pcr POSITIVE (*) NEGATIVE Final   Comment: CRITICAL RESULT CALLED TO, READ BACK BY AND VERIFIED WITH:     MOSS P RN 03/21/14 0740 COSTELLO B  OVA AND PARASITE EXAMINATION     Status: None   Collection Time    03/20/14  8:01 PM      Result Value Ref Range Status   Specimen Description STOOL   Final   Special Requests Normal   Final   Ova and parasites     Final   Value: NO OVA OR PARASITES SEEN     Performed at [facility]   Report Status 03/21/2014 FINAL   Final     Studies: Ct Head Wo Contrast  03/20/2014   CLINICAL DATA:  Syncope.  EXAM: CT HEAD WITHOUT CONTRAST  CT CERVICAL SPINE WITHOUT CONTRAST  TECHNIQUE: Multidetector CT imaging of the head and cervical spine was performed following the standard protocol without intravenous contrast. Multiplanar CT image reconstructions of the cervical spine were also generated.  COMPARISON:  CT head 10/15/2010.  FINDINGS: CT HEAD FINDINGS  Scattered hypodensities throughout the white matter compatible chronic ischemic changes, stable. Scattered low-density areas in the basal ganglia bilaterally, likely lacune or infarcts. No acute infarction. No hemorrhage or hydrocephalus. No extra-axial fluid collection. No acute calvarial abnormality.  Visualized paranasal sinuses and mastoids clear. Orbital soft tissues unremarkable.  CT CERVICAL SPINE FINDINGS  Prior anterior fusion from C5-C7. No hardware complicating feature. Normal alignment. No fracture. No epidural or paraspinal hematoma.  IMPRESSION: No acute intracranial abnormality.  Prior anterior fusion C5-C7.  No acute bony abnormality.   Electronically Signed   By: Rolm Baptise M.D.   On: 03/20/2014 13:02   Ct Cervical Spine Wo Contrast  03/20/2014    CLINICAL DATA:  Syncope.  EXAM: CT HEAD WITHOUT CONTRAST  CT CERVICAL SPINE WITHOUT CONTRAST  TECHNIQUE: Multidetector CT imaging of the head and cervical spine was performed following the standard protocol without intravenous contrast. Multiplanar CT image reconstructions of the cervical spine were also generated.  COMPARISON:  CT head 10/15/2010.  FINDINGS: CT HEAD FINDINGS  Scattered hypodensities throughout the white matter compatible chronic ischemic changes, stable. Scattered low-density areas in the basal ganglia bilaterally, likely lacune or infarcts. No acute infarction. No hemorrhage or hydrocephalus. No extra-axial fluid collection. No acute calvarial abnormality.  Visualized paranasal sinuses and mastoids clear. Orbital soft tissues unremarkable.  CT CERVICAL SPINE FINDINGS  Prior anterior fusion from C5-C7.  No hardware complicating feature. Normal alignment. No fracture. No epidural or paraspinal hematoma.  IMPRESSION: No acute intracranial abnormality.  Prior anterior fusion C5-C7.  No acute bony abnormality.   Electronically Signed   By: Rolm Baptise M.D.   On: 03/20/2014 13:02   Acute Abdominal Series  03/21/2014   CLINICAL DATA:  Abdominal pain.  EXAM: ACUTE ABDOMEN SERIES (ABDOMEN 2 VIEW & CHEST 1 VIEW)  COMPARISON:  CT of the abdomen and pelvis performed 07/18/2012, and chest radiograph from 12/20/2011  FINDINGS: The lungs are well-aerated. Mild left basilar opacity may reflect atelectasis or possibly mild pneumonia. There is no evidence of pleural effusion or pneumothorax. The cardiomediastinal silhouette is borderline normal in size. The patient is status post median sternotomy. Cervical spinal fusion hardware is noted.  The visualized bowel gas pattern is unremarkable. The colon is largely filled with air; there is no evidence of small bowel dilatation to suggest obstruction. No free intra-abdominal air is identified on the provided upright view.  No acute osseous abnormalities are seen; the  sacroiliac joints are unremarkable in appearance.  IMPRESSION: 1. Unremarkable bowel gas pattern; no free intra-abdominal air seen. The colon is largely filled with air, without evidence of significant dilatation. 2. Mild left basilar airspace opacity may reflect atelectasis or possibly mild pneumonia.   Electronically Signed   By: Garald Balding M.D.   On: 03/21/2014 06:05    Scheduled Meds: . ALPRAZolam  1 mg Oral QHS  . aspirin EC  81 mg Oral Daily  . atorvastatin  40 mg Oral q1800  . clopidogrel  75 mg Oral Daily  . diltiazem  120 mg Oral Daily  . enoxaparin (LOVENOX) injection  40 mg Subcutaneous Q24H  . famotidine  20 mg Oral BID  . metroNIDAZOLE  500 mg Oral 3 times per day  . potassium chloride  10 mEq Oral BID  . QUEtiapine  25 mg Oral TID  . sodium chloride  3 mL Intravenous Q12H   Continuous Infusions: . sodium chloride 1,000 mL (03/21/14 1055)    Principal Problem:   C. difficile diarrhea Active Problems:   HYPERTENSION   Gastric AV malformation   Hypotension   Diarrhea   Syncope   Leukocytosis   CKD (chronic kidney disease)    Melton Alar, PA-C  [facility] Pager [phone]. If 7PM-7AM, please contact night-coverage at [email or web address], password [facility] 03/21/2014, 12:59 PM  LOS: 1 day

## 2014-03-21 NOTE — Progress Notes (Signed)
UR completed. Patient changed to inpatient- requiring IVF- c diff +

## 2014-03-22 LAB — BASIC METABOLIC PANEL
BUN: 12 mg/dL (ref 6–23)
CHLORIDE: 115 meq/L — AB (ref 96–112)
CO2: 22 mEq/L (ref 19–32)
Calcium: 8 mg/dL — ABNORMAL LOW (ref 8.4–10.5)
Creatinine, Ser: 1.22 mg/dL (ref 0.50–1.35)
GFR calc Af Amer: 64 mL/min — ABNORMAL LOW (ref >90)
GFR calc non Af Amer: 55 mL/min — ABNORMAL LOW (ref >90)
Glucose, Bld: 97 mg/dL (ref 70–99)
Potassium: 4.2 mEq/L (ref 3.7–5.3)
SODIUM: 145 meq/L (ref 137–147)

## 2014-03-22 LAB — T4: T4, Total: 6.8 ug/dL (ref 5.0–12.5)

## 2014-03-22 LAB — CBC
HEMATOCRIT: 35.1 % — AB (ref 39.0–52.0)
Hemoglobin: 11.9 g/dL — ABNORMAL LOW (ref 13.0–17.0)
MCH: 31.8 pg (ref 26.0–34.0)
MCHC: 33.9 g/dL (ref 30.0–36.0)
MCV: 93.9 fL (ref 78.0–100.0)
Platelets: 154 10*3/uL (ref 150–400)
RBC: 3.74 MIL/uL — AB (ref 4.22–5.81)
RDW: 14.2 % (ref 11.5–15.5)
WBC: 8.2 10*3/uL (ref 4.0–10.5)

## 2014-03-22 LAB — T3, FREE: T3 FREE: 2.8 pg/mL (ref 2.3–4.2)

## 2014-03-22 LAB — TSH: TSH: 2.29 u[IU]/mL (ref 0.350–4.500)

## 2014-03-22 MED ORDER — SODIUM CHLORIDE 0.9 % IV SOLN: INTRAVENOUS | Status: DC

## 2014-03-22 MED ORDER — SODIUM CHLORIDE 0.9 % IV SOLN
INTRAVENOUS | Status: AC
  Administered 2014-03-22: 06:00:00 via INTRAVENOUS

## 2014-03-22 NOTE — Evaluation (Signed)
Physical Therapy Evaluation Patient Details Name: John [name] MRN: [id number] DOB: 11/20/1934 Today's Date: 03/22/2014   History of Present Illness  79 y.o. male admitted with c-diff.   Clinical Impression  *Pt ambulated 400' independently without loss of balance. He is independent with mobility and has no PT needs. PT signing off. **    Follow Up Recommendations No PT follow up    Equipment Recommendations  None recommended by PT    Recommendations for Other Services       Precautions / Restrictions Precautions Precautions: None Restrictions Weight Bearing Restrictions: No      Mobility  Bed Mobility Overal bed mobility: Independent                Transfers Overall transfer level: Independent                  Ambulation/Gait Ambulation/Gait assistance: Independent Ambulation Distance (Feet): 400 Feet Assistive device: None Gait Pattern/deviations: WFL(Within Functional Limits)   Gait velocity interpretation: at or above normal speed for age/gender General Gait Details: steady, no LOB  Stairs            Wheelchair Mobility    Modified Rankin (Stroke Patients Only)       Balance Overall balance assessment: Independent                                           Pertinent Vitals/Pain *0/10 pain**    Home Living Family/patient expects to be discharged to:: Private residence Living Arrangements: Spouse/significant other Available Help at Discharge: Available 24 hours/day Type of Home: House         Home Equipment: None      Prior Function Level of Independence: Independent               Hand Dominance        Extremity/Trunk Assessment   Upper Extremity Assessment: Overall WFL for tasks assessed           Lower Extremity Assessment: Overall WFL for tasks assessed      Cervical / Trunk Assessment: Normal  Communication   Communication: No difficulties  Cognition Arousal/Alertness:  Awake/alert Behavior During Therapy: WFL for tasks assessed/performed Overall Cognitive Status: Within Functional Limits for tasks assessed                      General Comments      Exercises        Assessment/Plan    PT Assessment Patent does not need any further PT services  PT Diagnosis     PT Problem List    PT Treatment Interventions     PT Goals (Current goals can be found in the Care Plan section) Acute Rehab PT Goals PT Goal Formulation: No goals set, d/c therapy    Frequency     Barriers to discharge        Co-evaluation               End of Session   Activity Tolerance: Patient tolerated treatment well Patient left: in bed;with call bell/phone within reach           Time: 1914-7829 PT Time Calculation (min): 8 min   Charges:   PT Evaluation $Initial PT Evaluation Tier I: 1 Procedure PT Treatments $Gait Training: 8-22 mins   PT G Codes:  Lucile Crater 03/22/2014, 1:48 PM [phone]

## 2014-03-22 NOTE — Care Management Note (Signed)
    Page 1 of 1   03/25/2014     6:07:48 PM CARE MANAGEMENT NOTE 03/25/2014  Patient:  John [name], John [name]   Account Number:  [id number]  Date Initiated:  03/22/2014  Documentation initiated by:  Tomi Bamberger  Subjective/Objective Assessment:   dx hypotension  admit- lives with spouse.     Action/Plan:   pt eval- no pt f/u needed.   Anticipated DC Date:  03/23/2014   Anticipated DC Plan:  [place]  CM consult      Choice offered to / List presented to:             Status of service:   Medicare Important Message given?   (If response is "NO", the following Medicare IM given date fields will be blank) Date Medicare IM given:   Date Additional Medicare IM given:    Discharge Disposition:  HOME/SELF CARE  Per UR Regulation:  Reviewed for med. necessity/level of care/duration of stay  If discussed at [place] of Stay Meetings, dates discussed:    Comments:  03/22/14 [place], BSN [phone] per physical therapy no pt f/u needed.

## 2014-03-22 NOTE — ED Provider Notes (Signed)
Medical screening examination/treatment/procedure(s) were conducted as a shared visit with non-physician practitioner(s) and myself.  I personally evaluated the patient during the encounter.   EKG Interpretation   Date/Time:  Wednesday Mar 20 2014 11:33:18 EDT Ventricular Rate:  88 PR Interval:  174 QRS Duration: 74 QT Interval:  365 QTC Calculation: 442 R Axis:   58 Text Interpretation:  Sinus rhythm Atrial premature complex Borderline T  abnormalities, diffuse leads ED PHYSICIAN INTERPRETATION AVAILABLE IN [facility] Confirmed by TEST, Record ([id number]) on 03/22/2014 7:20:20 AM     CRITICAL CARE Performed by: Osvaldo Shipper   Total critical care time: 30  Critical care time was exclusive of separately billable procedures and treating other patients.  Critical care was necessary to treat or prevent imminent or life-threatening deterioration.  Critical care was time spent personally by me on the following activities: development of treatment plan with patient and/or surrogate as well as nursing, discussions with consultants, evaluation of patient's response to treatment, examination of patient, obtaining history from patient or surrogate, ordering and performing treatments and interventions, ordering and review of laboratory studies, ordering and review of radiographic studies, pulse oximetry and re-evaluation of patient's condition.   Patient here with passing out, hypotension. Initially BP of 20U systolic. On my initial exam, SBP 97. BPs improving with fluids. Patient notes some recent diarrhea. Mild elevation in WBC, BUN, creatinine. Patient admitted to [facility] for his dehydration.   Osvaldo Shipper, MD 03/22/14 [phone]

## 2014-03-22 NOTE — Progress Notes (Signed)
PROGRESS NOTE  John [name] LOV:[id number] DOB: 13-Aug-1935 DOA: 03/20/2014 PCP: Alesia Richards, MD  John [name] is a 79 y.o. male who developed diarrhea 1 month ago. He has been getting up 20x a night with diarrhea. Last night he developed some abdominal pain and had a syncopal event when getting up to get a drink.  No fever, no chills, no new meds. In 1989 he was diagnosed with dysentery (stool was sent to [facility]).  His PCP attributed his diarrhea to his diverticula. He does endorse nausea and fecal leakage. Colonoscopy 08/2013 showed diverticulosis   Assessment/Plan:  C. difficile diarrhea Diarrhea decreased from 20 BM a day to 8 BM in the last 24 hours. Placed on oral Flagyl. Discontinue Protonix. Start Pepcid and [facility] on board.  Appreciate their consultation.  Syncopal episode Secondary to orthostasis from diarrhea Improved with IV fluids No longer orthostatic.  Bradycardia. Patient with heart rates in the 40s. Was started on Cardizem in 07/2013 for PVCs by cardiology. Will discontinue Cardizem at this point.   Check TSH, Free T4, Free T3. Monitor.  Elevated creatinine Improved with IV fluids. Has a history of kidney stones and likely some level of chronic kidney disease.  Type 2 diabetes CBGs well controlled by diet Hemoglobin A1c was 5.9 on 02/06/2014  Hyperlipidemia Continue simvastatin  Hypertension Continue diltiazem with hold parameters  History of coronary artery disease and TIA On aspirin and Plavix at home Currently receiving only Plavix inpatient.   DVT Prophylaxis:  Lovenox  Code Status: Full Family Communication: Patient is alert and oriented Disposition Plan: To home when able   Consultants:  [facility]     Antibiotics: Anti-infectives   Start     Dose/Rate Route Frequency Ordered Stop   03/21/14 0930  metroNIDAZOLE (FLAGYL) tablet 500 mg     500 mg Oral 3 times per day 03/21/14 0818     03/20/14 1800   metroNIDAZOLE (FLAGYL) tablet 250 mg  Status:  Discontinued     250 mg Oral 4 times per day 03/20/14 1622 03/21/14 0818    HPI/Subjective: No longer dizzy when standing.  8 BMs overnight.  Reports he takes cardizem "when he needs it" but hasn't taken it for a whle.  Objective: Filed Vitals:   03/21/14 1803 03/21/14 2221 03/22/14 0517 03/22/14 1158  BP:   96/51 111/58  Pulse:   43 66  Temp: 97.8 F (36.6 C) 97.5 F (36.4 C) 97.6 F (36.4 C)   TempSrc: Oral  Oral   Resp: 18  15   Height:      Weight:      SpO2: 96% 97%  97%    Intake/Output Summary (Last 24 hours) at 03/22/14 1305 Last data filed at 03/21/14 1407  Gross per 24 hour  Intake      0 ml  Output    600 ml  Net   -600 ml   Filed Weights   03/20/14 1117 03/20/14 1545  Weight: 78.019 kg (172 lb) 75.206 kg (165 lb 12.8 oz)    Exam: General: Well developed, well nourished, NAD, appears stated age,  Crawford Givens without dizziness. HEENT:   Anicteic Sclera, MMM. No pharyngeal erythema or exudates  Neck: Supple, no JVD, no masses  Cardiovascular: Bradycardic, difficult to hear, but normal S1 S2 auscultated, no rubs, murmurs or gallops.   Respiratory: Clear to auscultation bilaterally with equal chest rise  Abdomen: Soft, nontender, nondistended, + bowel sounds, central hernia.  Well-healed central scar from  CABG Extremities: warm dry without cyanosis clubbing or edema.  Neuro: AAOx3, cranial nerves grossly intact. Strength 5/5 in upper and lower extremities  Skin: Without rashes exudates or nodules.   Psych: Normal affect and demeanor with intact judgement and insight    Data Reviewed: Basic Metabolic Panel:  Recent Labs Lab 03/20/14 1145 03/30/14 0554 03/22/14 0820  NA 139 142 145  K 4.3 4.4 4.2  CL 105 112 115*  CO2 20 21 22   GLUCOSE 111* 82 97  BUN 24* 16 12  CREATININE 1.49* 1.28 1.22  CALCIUM 9.0 8.4 8.0*   Liver Function Tests:  Recent Labs Lab 03/20/14 1145 2014/03/30 0554  AST 13 11  ALT 6 7   ALKPHOS 65 55  BILITOT 0.5 0.5  PROT 6.8 5.3*  ALBUMIN 3.5 2.7*    Recent Labs Lab 03/20/14 1145  LIPASE 12   CBC:  Recent Labs Lab 03/20/14 1145 30-Mar-2014 0554 03/22/14 0820  WBC 12.6* 9.7 8.2  NEUTROABS 6.7  --   --   HGB 15.3 12.6* 11.9*  HCT 43.7 37.4* 35.1*  MCV 91.8 93.3 93.9  PLT 201 177 154   CBG:  Recent Labs Lab 03/20/14 1233  GLUCAP 94    Recent Results (from the past 240 hour(s))  CLOSTRIDIUM DIFFICILE BY PCR     Status: Abnormal   Collection Time    03/20/14  8:01 PM      Result Value Ref Range Status   C difficile by pcr POSITIVE (*) NEGATIVE Final   Comment: CRITICAL RESULT CALLED TO, READ BACK BY AND VERIFIED WITH:     MOSS P RN 2014/03/30 0740 COSTELLO B  STOOL CULTURE     Status: None   Collection Time    03/20/14  8:01 PM      Result Value Ref Range Status   Specimen Description STOOL   Final   Special Requests Normal   Final   Culture     Final   Value: NO SUSPICIOUS COLONIES, CONTINUING TO HOLD     Performed at [facility]   Report Status PENDING   Incomplete  OVA AND PARASITE EXAMINATION     Status: None   Collection Time    03/20/14  8:01 PM      Result Value Ref Range Status   Specimen Description STOOL   Final   Special Requests Normal   Final   Ova and parasites     Final   Value: NO OVA OR PARASITES SEEN     Performed at [facility]   Report Status 03/30/2014 FINAL   Final     Studies: Acute Abdominal Series  2014-03-30   CLINICAL DATA:  Abdominal pain.  EXAM: ACUTE ABDOMEN SERIES (ABDOMEN 2 VIEW & CHEST 1 VIEW)  COMPARISON:  CT of the abdomen and pelvis performed 07/18/2012, and chest radiograph from 12/20/2011  FINDINGS: The lungs are well-aerated. Mild left basilar opacity may reflect atelectasis or possibly mild pneumonia. There is no evidence of pleural effusion or pneumothorax. The cardiomediastinal silhouette is borderline normal in size. The patient is status post median sternotomy. Cervical spinal  fusion hardware is noted.  The visualized bowel gas pattern is unremarkable. The colon is largely filled with air; there is no evidence of small bowel dilatation to suggest obstruction. No free intra-abdominal air is identified on the provided upright view.  No acute osseous abnormalities are seen; the sacroiliac joints are unremarkable in appearance.  IMPRESSION: 1. Unremarkable bowel gas pattern; no  free intra-abdominal air seen. The colon is largely filled with air, without evidence of significant dilatation. 2. Mild left basilar airspace opacity may reflect atelectasis or possibly mild pneumonia.   Electronically Signed   By: Garald Balding M.D.   On: 03/21/2014 06:05    Scheduled Meds: . ALPRAZolam  1 mg Oral QHS  . aspirin EC  81 mg Oral Daily  . atorvastatin  40 mg Oral q1800  . clopidogrel  75 mg Oral Daily  . enoxaparin (LOVENOX) injection  40 mg Subcutaneous Q24H  . famotidine  20 mg Oral BID  . metroNIDAZOLE  500 mg Oral 3 times per day  . QUEtiapine  25 mg Oral TID   Continuous Infusions: . sodium chloride 0.9 % 1,000 mL with potassium chloride 20 mEq infusion 50 mL/hr at 03/22/14 1012    Principal Problem:   C. difficile diarrhea Active Problems:   HYPERTENSION   Gastric AV malformation   Hypotension   Diarrhea   Syncope   Leukocytosis   CKD (chronic kidney disease)    Melton Alar, PA-C  [facility] Pager [phone]. If 7PM-7AM, please contact night-coverage at [email or web address], password [facility] 03/22/2014, 1:05 PM  LOS: 2 days

## 2014-03-22 NOTE — Progress Notes (Signed)
  I have directly reviewed the clinical findings, lab, imaging studies and management of this patient in detail. I have interviewed and examined the patient and agree with the documentation,  as recorded by the Physician extender.  Thurnell Lose M.D on 03/22/2014 at 1:44 PM  [facility] Office  [phone]

## 2014-03-23 DIAGNOSIS — R55 Syncope and collapse: Secondary | ICD-10-CM

## 2014-03-23 DIAGNOSIS — I25810 Atherosclerosis of coronary artery bypass graft(s) without angina pectoris: Secondary | ICD-10-CM

## 2014-03-23 DIAGNOSIS — E785 Hyperlipidemia, unspecified: Secondary | ICD-10-CM

## 2014-03-23 MED ORDER — SACCHAROMYCES BOULARDII 250 MG PO CAPS
250.0000 mg | ORAL_CAPSULE | Freq: Two times a day (BID) | ORAL | Status: DC
  Administered 2014-03-23 – 2014-03-24 (×3): 250 mg via ORAL
  Filled 2014-03-23 (×5): qty 1

## 2014-03-23 MED ORDER — ASPIRIN 81 MG PO TABS: 81.0000 mg | ORAL_TABLET | Freq: Every day | ORAL | Status: DC

## 2014-03-23 NOTE — Progress Notes (Signed)
Pt ambulated around the unit. Heart rate stayed within the 90's. Will continue to monitor.

## 2014-03-23 NOTE — Progress Notes (Signed)
PATIENT DETAILS Name: John [name] Age: 79 y.o. Sex: male Date of Birth: [birth year] Admit Date: 03/20/2014 Admitting Physician Geradine Girt, DO IRJ:[email or web address],AYTKZSW DAVID, MD  Subjective: Diarrhea somewhat improved-but nonetheless continues  Assessment/Plan: Principal Problem:   C. difficile diarrhea -slowly improving -continue Flagyl, add Florastor  Active Problems: Syncopal episode  -Secondary to orthostasis from diarrhea,Improved with IV fluids  -No longer orthostatic.  Sinus Bradycardia -mostly at rest, good chronotropic response to exercise with increase in HR -TSH within normal limits, cardizem has been discontinued.    HYPERTENSION -controlled without the use of anti-hypertensives at this time.  Hyperlipidemia  Continue simvastatin   History of coronary artery disease and TIA  -On aspirin and Plavix, stable at present  Type 2 diabetes  -CBGs well controlled by diet  -Hemoglobin A1c was 5.9 on 02/06/2014  Acute Renal Failure -likely secondary to diarrhea, resolved with IVF and supportive care.  Disposition: Remain inpatient  DVT Prophylaxis: Prophylactic Lovenox   Code Status: Full code   Family Communication None at bedside  Procedures:  None  CONSULTS:  GI  Time spent 40 minutes-which includes 50% of the time with face-to-face with patient/ family and coordinating care related to the above assessment and plan.    MEDICATIONS: Scheduled Meds: . ALPRAZolam  1 mg Oral QHS  . aspirin EC  81 mg Oral Daily  . atorvastatin  40 mg Oral q1800  . clopidogrel  75 mg Oral Daily  . enoxaparin (LOVENOX) injection  40 mg Subcutaneous Q24H  . famotidine  20 mg Oral BID  . metroNIDAZOLE  500 mg Oral 3 times per day  . QUEtiapine  25 mg Oral TID  . saccharomyces boulardii  250 mg Oral BID   Continuous Infusions: . sodium chloride 0.9 % 1,000 mL with potassium chloride 20 mEq infusion 50 mL/hr at 03/23/14 0830   PRN  Meds:.acetaminophen, acetaminophen, calcium carbonate, ondansetron (ZOFRAN) IV, ondansetron  Antibiotics: Anti-infectives   Start     Dose/Rate Route Frequency Ordered Stop   03/21/14 0930  metroNIDAZOLE (FLAGYL) tablet 500 mg     500 mg Oral 3 times per day 03/21/14 0818     03/20/14 1800  metroNIDAZOLE (FLAGYL) tablet 250 mg  Status:  Discontinued     250 mg Oral 4 times per day 03/20/14 1622 03/21/14 0818       PHYSICAL EXAM: Vital signs in last 24 hours: Filed Vitals:   03/22/14 1158 03/22/14 1424 03/22/14 2103 03/23/14 0500  BP: 111/58 106/65 118/65 121/64  Pulse: 66 46 57 70  Temp:  97.6 F (36.4 C) 97.8 F (36.6 C) 98.3 F (36.8 C)  TempSrc:  Oral Oral Oral  Resp:  14 16 16   Height:      Weight:      SpO2: 97% 100% 99% 96%    Weight change:  Filed Weights   03/20/14 1117 03/20/14 1545  Weight: 78.019 kg (172 lb) 75.206 kg (165 lb 12.8 oz)   Body mass index is 22.8 kg/(m^2).   Gen Exam: Awake and alert with clear speech.   Neck: Supple, No JVD.   Chest: B/L Clear.   CVS: S1 S2 Regular, no murmurs.  Abdomen: soft, BS +, non tender, non distended.  Extremities: no edema, lower extremities warm to touch. Neurologic: Non Focal.   Skin: No Rash.   Wounds: N/A.   Intake/Output from previous day:  Intake/Output Summary (Last 24 hours) at 03/23/14 1434 Last data filed at  03/23/14 0946  Gross per 24 hour  Intake   1660 ml  Output    400 ml  Net   1260 ml     LAB RESULTS: CBC  Recent Labs Lab 03/20/14 1145 03/21/14 0554 03/22/14 0820  WBC 12.6* 9.7 8.2  HGB 15.3 12.6* 11.9*  HCT 43.7 37.4* 35.1*  PLT 201 177 154  MCV 91.8 93.3 93.9  MCH 32.1 31.4 31.8  MCHC 35.0 33.7 33.9  RDW 13.7 14.0 14.2  LYMPHSABS 2.3  --   --   MONOABS 0.5  --   --   EOSABS 2.8*  --   --   BASOSABS 0.3*  --   --     Chemistries   Recent Labs Lab 03/20/14 1145 03/21/14 0554 03/22/14 0820  NA 139 142 145  K 4.3 4.4 4.2  CL 105 112 115*  CO2 20 21 22   GLUCOSE  111* 82 97  BUN 24* 16 12  CREATININE 1.49* 1.28 1.22  CALCIUM 9.0 8.4 8.0*    CBG:  Recent Labs Lab 03/20/14 1233  GLUCAP 94    GFR Estimated Creatinine Clearance: 53.9 ml/min (by C-G formula based on Cr of 1.22).  Coagulation profile No results found for this basename: INR, PROTIME,  in the last 168 hours  Cardiac Enzymes No results found for this basename: CK, CKMB, TROPONINI, MYOGLOBIN,  in the last 168 hours  No components found with this basename: POCBNP,  No results found for this basename: DDIMER,  in the last 72 hours No results found for this basename: HGBA1C,  in the last 72 hours No results found for this basename: CHOL, HDL, LDLCALC, TRIG, CHOLHDL, LDLDIRECT,  in the last 72 hours  Recent Labs  03/22/14 1038  TSH 2.290  T4TOTAL 6.8  T3FREE 2.8   No results found for this basename: VITAMINB12, FOLATE, FERRITIN, TIBC, IRON, RETICCTPCT,  in the last 72 hours No results found for this basename: LIPASE, AMYLASE,  in the last 72 hours  Urine Studies No results found for this basename: UACOL, UAPR, USPG, UPH, UTP, UGL, UKET, UBIL, UHGB, UNIT, UROB, ULEU, UEPI, UWBC, URBC, UBAC, CAST, CRYS, UCOM, BILUA,  in the last 72 hours  MICROBIOLOGY: Recent Results (from the past 240 hour(s))  CLOSTRIDIUM DIFFICILE BY PCR     Status: Abnormal   Collection Time    03/20/14  8:01 PM      Result Value Ref Range Status   C difficile by pcr POSITIVE (*) NEGATIVE Final   Comment: CRITICAL RESULT CALLED TO, READ BACK BY AND VERIFIED WITH:     MOSS P RN 03/21/14 0740 COSTELLO B  STOOL CULTURE     Status: None   Collection Time    03/20/14  8:01 PM      Result Value Ref Range Status   Specimen Description STOOL   Final   Special Requests Normal   Final   Culture     Final   Value: NO SUSPICIOUS COLONIES, CONTINUING TO HOLD     Performed at [facility]   Report Status PENDING   Incomplete  OVA AND PARASITE EXAMINATION     Status: None   Collection Time     03/20/14  8:01 PM      Result Value Ref Range Status   Specimen Description STOOL   Final   Special Requests Normal   Final   Ova and parasites     Final   Value: NO OVA OR PARASITES SEEN  Performed at [facility]   Report Status 03/21/2014 FINAL   Final    RADIOLOGY STUDIES/RESULTS: Ct Head Wo Contrast  03/20/2014   CLINICAL DATA:  Syncope.  EXAM: CT HEAD WITHOUT CONTRAST  CT CERVICAL SPINE WITHOUT CONTRAST  TECHNIQUE: Multidetector CT imaging of the head and cervical spine was performed following the standard protocol without intravenous contrast. Multiplanar CT image reconstructions of the cervical spine were also generated.  COMPARISON:  CT head 10/15/2010.  FINDINGS: CT HEAD FINDINGS  Scattered hypodensities throughout the white matter compatible chronic ischemic changes, stable. Scattered low-density areas in the basal ganglia bilaterally, likely lacune or infarcts. No acute infarction. No hemorrhage or hydrocephalus. No extra-axial fluid collection. No acute calvarial abnormality.  Visualized paranasal sinuses and mastoids clear. Orbital soft tissues unremarkable.  CT CERVICAL SPINE FINDINGS  Prior anterior fusion from C5-C7. No hardware complicating feature. Normal alignment. No fracture. No epidural or paraspinal hematoma.  IMPRESSION: No acute intracranial abnormality.  Prior anterior fusion C5-C7.  No acute bony abnormality.   Electronically Signed   By: Rolm Baptise M.D.   On: 03/20/2014 13:02   Ct Cervical Spine Wo Contrast  03/20/2014   CLINICAL DATA:  Syncope.  EXAM: CT HEAD WITHOUT CONTRAST  CT CERVICAL SPINE WITHOUT CONTRAST  TECHNIQUE: Multidetector CT imaging of the head and cervical spine was performed following the standard protocol without intravenous contrast. Multiplanar CT image reconstructions of the cervical spine were also generated.  COMPARISON:  CT head 10/15/2010.  FINDINGS: CT HEAD FINDINGS  Scattered hypodensities throughout the white matter compatible  chronic ischemic changes, stable. Scattered low-density areas in the basal ganglia bilaterally, likely lacune or infarcts. No acute infarction. No hemorrhage or hydrocephalus. No extra-axial fluid collection. No acute calvarial abnormality.  Visualized paranasal sinuses and mastoids clear. Orbital soft tissues unremarkable.  CT CERVICAL SPINE FINDINGS  Prior anterior fusion from C5-C7. No hardware complicating feature. Normal alignment. No fracture. No epidural or paraspinal hematoma.  IMPRESSION: No acute intracranial abnormality.  Prior anterior fusion C5-C7.  No acute bony abnormality.   Electronically Signed   By: Rolm Baptise M.D.   On: 03/20/2014 13:02   Acute Abdominal Series  03/21/2014   CLINICAL DATA:  Abdominal pain.  EXAM: ACUTE ABDOMEN SERIES (ABDOMEN 2 VIEW & CHEST 1 VIEW)  COMPARISON:  CT of the abdomen and pelvis performed 07/18/2012, and chest radiograph from 12/20/2011  FINDINGS: The lungs are well-aerated. Mild left basilar opacity may reflect atelectasis or possibly mild pneumonia. There is no evidence of pleural effusion or pneumothorax. The cardiomediastinal silhouette is borderline normal in size. The patient is status post median sternotomy. Cervical spinal fusion hardware is noted.  The visualized bowel gas pattern is unremarkable. The colon is largely filled with air; there is no evidence of small bowel dilatation to suggest obstruction. No free intra-abdominal air is identified on the provided upright view.  No acute osseous abnormalities are seen; the sacroiliac joints are unremarkable in appearance.  IMPRESSION: 1. Unremarkable bowel gas pattern; no free intra-abdominal air seen. The colon is largely filled with air, without evidence of significant dilatation. 2. Mild left basilar airspace opacity may reflect atelectasis or possibly mild pneumonia.   Electronically Signed   By: Garald Balding M.D.   On: 03/21/2014 06:05    Shanker Kristeen Mans, MD  [facility] Pager:[phone]  [phone]  If 7PM-7AM, please contact night-coverage [email or web address] Password [email or web address] 03/23/2014, 2:34 PM   LOS: 3 days   **Disclaimer: This note may have been dictated with  voice recognition software. Similar sounding words can inadvertently be transcribed and this note may contain transcription errors which may not have been corrected upon publication of note.**

## 2014-03-24 DIAGNOSIS — G459 Transient cerebral ischemic attack, unspecified: Secondary | ICD-10-CM

## 2014-03-24 DIAGNOSIS — E1129 Type 2 diabetes mellitus with other diabetic kidney complication: Secondary | ICD-10-CM

## 2014-03-24 LAB — STOOL CULTURE: SPECIAL REQUESTS: NORMAL

## 2014-03-24 MED ORDER — SACCHAROMYCES BOULARDII 250 MG PO CAPS
250.0000 mg | ORAL_CAPSULE | Freq: Two times a day (BID) | ORAL | Status: DC
Start: 2014-03-24 — End: 2014-08-27

## 2014-03-24 MED ORDER — FAMOTIDINE 20 MG PO TABS: 20.0000 mg | ORAL_TABLET | Freq: Two times a day (BID) | ORAL | Status: DC

## 2014-03-24 MED ORDER — METRONIDAZOLE 500 MG PO TABS: 500.0000 mg | ORAL_TABLET | Freq: Three times a day (TID) | ORAL | Status: DC

## 2014-03-24 NOTE — Discharge Summary (Signed)
John [name] to be D/C'd Home per MD order.  Discussed with the patient and all questions fully answered.    Medication List    STOP taking these medications       diltiazem 120 MG 24 hr capsule  Commonly known as:  CARDIZEM CD     loperamide 2 MG tablet  Commonly known as:  IMODIUM A-D     pantoprazole 40 MG tablet  Commonly known as:  PROTONIX      TAKE these medications       ALPRAZolam 1 MG tablet  Commonly known as:  XANAX  Take 1 mg by mouth at bedtime.     aspirin 81 MG tablet  Take 81 mg by mouth daily.     clopidogrel 75 MG tablet  Commonly known as:  PLAVIX  Take 75 mg by mouth daily.     famotidine 20 MG tablet  Commonly known as:  PEPCID  Take 1 tablet (20 mg total) by mouth 2 (two) times daily.     freestyle lancets  1 each by Other route as needed for other. Use as instructed     FREESTYLE LITE test strip  Generic drug:  glucose blood  1 each by Other route as needed for other. Use as instructed     LOTEMAX 0.5 % Gel  Generic drug:  Loteprednol Etabonate  Apply 1 drop to eye daily.     metroNIDAZOLE 500 MG tablet  Commonly known as:  FLAGYL  Take 1 tablet (500 mg total) by mouth every 8 (eight) hours.     nitroGLYCERIN 0.4 MG SL tablet  Commonly known as:  NITROSTAT  Sig: 1 tablet under tongue every 3 to 5 minutes as needed for Angina - Please Dispense # 2 bottles of # 25 tabs     potassium citrate 10 MEQ (1080 MG) SR tablet  Commonly known as:  UROCIT-K  Take 10 mEq by mouth 2 (two) times daily.     PREVALITE 4 G packet  Generic drug:  cholestyramine light  Take 1 packet by mouth 2 (two) times daily.     promethazine 25 MG tablet  Commonly known as:  PHENERGAN  Take 25 mg by mouth every 8 (eight) hours as needed for nausea.     QUEtiapine 25 MG tablet  Commonly known as:  SEROQUEL  Take 1 tablet (25 mg total) by mouth 3 (three) times daily.     saccharomyces boulardii 250 MG capsule  Commonly known as:  FLORASTOR  Take 1  capsule (250 mg total) by mouth 2 (two) times daily.     simvastatin 80 MG tablet  Commonly known as:  ZOCOR  Take 40 mg by mouth at bedtime. Takes 1/2 tab=40mg      Vitamin D3 2000 UNITS capsule  Take 2,000 Units by mouth daily.        VVS, Skin clean, dry and intact without evidence of skin break down, no evidence of skin tears noted. IV catheter discontinued intact. Site without signs and symptoms of complications. Dressing and pressure applied.  An After Visit Summary was printed and given to the patient.  D/c education completed with patient/family including follow up instructions, medication list, d/c activities limitations if indicated, with other d/c instructions as indicated by MD - patient able to verbalize understanding, all questions fully answered.   Patient instructed to return to ED, call 911, or call MD for any changes in condition.   Patient escorted via [place], and D/C home  via private auto.  Henriette Combs 03/24/2014 12:59 PM

## 2014-03-24 NOTE — Discharge Summary (Signed)
PATIENT DETAILS Name: John [name] Age: 79 y.o. Sex: male Date of Birth: 1934-11-16 MRN: [id number]. Admit Date: 03/20/2014 Admitting Physician: Geradine Girt, DO VPX:TGGYIRS,WNIOEVO DAVID, MD  Recommendations for Outpatient Follow-up:  1. General Health Maintenace 2. Swtiched over from PPI to Pepcid-given C Dif 3. Off Cardizem due to Bradycardia  PRIMARY DISCHARGE DIAGNOSIS:  Principal Problem:   C. difficile diarrhea Active Problems:   HYPERTENSION   Gastric AV malformation   Hypotension   Diarrhea   Syncope   Leukocytosis   CKD (chronic kidney disease)      PAST MEDICAL HISTORY: Past Medical History  Diagnosis Date  . TIA   . SYNCOPE   . HYPERTENSION   . HYPERLIPIDEMIA   . CAD, ARTERY BYPASS GRAFT   . SUPRAVENTRICULAR TACHYCARDIA, HX OF   . Leukodystrophy   . GASTROESOPHAGEAL REFLUX DISEASE   . ASTHMA   . Diverticulosis   . Hiatal hernia   . Insomnia   . Anxiety   . Blood transfusion     "related to OHS, ulcers"  . Ulcer   . Colon polyps     adenomatous polyps  . COPD   . Sleep apnea     "don't always wear it" (03/20/2014)  . TIA (transient ischemic attack) ~ 2010  . Arthritis     "lower back; left ankle" (03/20/2014)  . DIABETES MELLITUS, TYPE II   . Nephrolithiasis     "I've had over 320 kidney stones" (03/20/2014)  . Dementia     "I'm in the early stages of dementia" (03/20/2014)    DISCHARGE MEDICATIONS:   Medication List    STOP taking these medications       diltiazem 120 MG 24 hr capsule  Commonly known as:  CARDIZEM CD     loperamide 2 MG tablet  Commonly known as:  IMODIUM A-D     pantoprazole 40 MG tablet  Commonly known as:  PROTONIX      TAKE these medications       ALPRAZolam 1 MG tablet  Commonly known as:  XANAX  Take 1 mg by mouth at bedtime.     aspirin 81 MG tablet  Take 81 mg by mouth daily.     clopidogrel 75 MG tablet  Commonly known as:  PLAVIX  Take 75 mg by mouth daily.     famotidine 20 MG tablet   Commonly known as:  PEPCID  Take 1 tablet (20 mg total) by mouth 2 (two) times daily.     freestyle lancets  1 each by Other route as needed for other. Use as instructed     FREESTYLE LITE test strip  Generic drug:  glucose blood  1 each by Other route as needed for other. Use as instructed     LOTEMAX 0.5 % Gel  Generic drug:  Loteprednol Etabonate  Apply 1 drop to eye daily.     metroNIDAZOLE 500 MG tablet  Commonly known as:  FLAGYL  Take 1 tablet (500 mg total) by mouth every 8 (eight) hours.     nitroGLYCERIN 0.4 MG SL tablet  Commonly known as:  NITROSTAT  Sig: 1 tablet under tongue every 3 to 5 minutes as needed for Angina - Please Dispense # 2 bottles of # 25 tabs     potassium citrate 10 MEQ (1080 MG) SR tablet  Commonly known as:  UROCIT-K  Take 10 mEq by mouth 2 (two) times daily.     PREVALITE 4 G packet  Generic drug:  cholestyramine light  Take 1 packet by mouth 2 (two) times daily.     promethazine 25 MG tablet  Commonly known as:  PHENERGAN  Take 25 mg by mouth every 8 (eight) hours as needed for nausea.     QUEtiapine 25 MG tablet  Commonly known as:  SEROQUEL  Take 1 tablet (25 mg total) by mouth 3 (three) times daily.     saccharomyces boulardii 250 MG capsule  Commonly known as:  FLORASTOR  Take 1 capsule (250 mg total) by mouth 2 (two) times daily.     simvastatin 80 MG tablet  Commonly known as:  ZOCOR  Take 40 mg by mouth at bedtime. Takes 1/2 tab=40mg      Vitamin D3 2000 UNITS capsule  Take 2,000 Units by mouth daily.        ALLERGIES:   Allergies  Allergen Reactions  . Beta Adrenergic Blockers     REACTION: Bradycardia  . Gabapentin     Severe confusion  . Prozac [Fluoxetine Hcl]   . Soma [Carisoprodol]     BRIEF HPI:  See H&P, Labs, Consult and Test reports for all details in brief, patient was admitted for diarrhea.  CONSULTATIONS:   GI  PERTINENT RADIOLOGIC STUDIES: Ct Head Wo Contrast  03/20/2014   CLINICAL DATA:   Syncope.  EXAM: CT HEAD WITHOUT CONTRAST  CT CERVICAL SPINE WITHOUT CONTRAST  TECHNIQUE: Multidetector CT imaging of the head and cervical spine was performed following the standard protocol without intravenous contrast. Multiplanar CT image reconstructions of the cervical spine were also generated.  COMPARISON:  CT head 10/15/2010.  FINDINGS: CT HEAD FINDINGS  Scattered hypodensities throughout the white matter compatible chronic ischemic changes, stable. Scattered low-density areas in the basal ganglia bilaterally, likely lacune or infarcts. No acute infarction. No hemorrhage or hydrocephalus. No extra-axial fluid collection. No acute calvarial abnormality.  Visualized paranasal sinuses and mastoids clear. Orbital soft tissues unremarkable.  CT CERVICAL SPINE FINDINGS  Prior anterior fusion from C5-C7. No hardware complicating feature. Normal alignment. No fracture. No epidural or paraspinal hematoma.  IMPRESSION: No acute intracranial abnormality.  Prior anterior fusion C5-C7.  No acute bony abnormality.   Electronically Signed   By: Rolm Baptise M.D.   On: 03/20/2014 13:02   Ct Cervical Spine Wo Contrast  03/20/2014   CLINICAL DATA:  Syncope.  EXAM: CT HEAD WITHOUT CONTRAST  CT CERVICAL SPINE WITHOUT CONTRAST  TECHNIQUE: Multidetector CT imaging of the head and cervical spine was performed following the standard protocol without intravenous contrast. Multiplanar CT image reconstructions of the cervical spine were also generated.  COMPARISON:  CT head 10/15/2010.  FINDINGS: CT HEAD FINDINGS  Scattered hypodensities throughout the white matter compatible chronic ischemic changes, stable. Scattered low-density areas in the basal ganglia bilaterally, likely lacune or infarcts. No acute infarction. No hemorrhage or hydrocephalus. No extra-axial fluid collection. No acute calvarial abnormality.  Visualized paranasal sinuses and mastoids clear. Orbital soft tissues unremarkable.  CT CERVICAL SPINE FINDINGS  Prior  anterior fusion from C5-C7. No hardware complicating feature. Normal alignment. No fracture. No epidural or paraspinal hematoma.  IMPRESSION: No acute intracranial abnormality.  Prior anterior fusion C5-C7.  No acute bony abnormality.   Electronically Signed   By: Rolm Baptise M.D.   On: 03/20/2014 13:02   Acute Abdominal Series  03/21/2014   CLINICAL DATA:  Abdominal pain.  EXAM: ACUTE ABDOMEN SERIES (ABDOMEN 2 VIEW & CHEST 1 VIEW)  COMPARISON:  CT of the abdomen and pelvis  performed 07/18/2012, and chest radiograph from 12/20/2011  FINDINGS: The lungs are well-aerated. Mild left basilar opacity may reflect atelectasis or possibly mild pneumonia. There is no evidence of pleural effusion or pneumothorax. The cardiomediastinal silhouette is borderline normal in size. The patient is status post median sternotomy. Cervical spinal fusion hardware is noted.  The visualized bowel gas pattern is unremarkable. The colon is largely filled with air; there is no evidence of small bowel dilatation to suggest obstruction. No free intra-abdominal air is identified on the provided upright view.  No acute osseous abnormalities are seen; the sacroiliac joints are unremarkable in appearance.  IMPRESSION: 1. Unremarkable bowel gas pattern; no free intra-abdominal air seen. The colon is largely filled with air, without evidence of significant dilatation. 2. Mild left basilar airspace opacity may reflect atelectasis or possibly mild pneumonia.   Electronically Signed   By: Garald Balding M.D.   On: 03/21/2014 06:05     PERTINENT LAB RESULTS: CBC:  Recent Labs  03/22/14 0820  WBC 8.2  HGB 11.9*  HCT 35.1*  PLT 154   CMET CMP     Component Value Date/Time   NA 145 03/22/2014 0820   K 4.2 03/22/2014 0820   CL 115* 03/22/2014 0820   CO2 22 03/22/2014 0820   GLUCOSE 97 03/22/2014 0820   BUN 12 03/22/2014 0820   CREATININE 1.22 03/22/2014 0820   CREATININE 1.34 02/06/2014 1553   CALCIUM 8.0* 03/22/2014 0820   PROT 5.3*  03/21/2014 0554   ALBUMIN 2.7* 03/21/2014 0554   AST 11 03/21/2014 0554   ALT 7 03/21/2014 0554   ALKPHOS 55 03/21/2014 0554   BILITOT 0.5 03/21/2014 0554   GFRNONAA 55* 03/22/2014 0820   GFRNONAA 51* 02/06/2014 1553   GFRAA 64* 03/22/2014 0820   GFRAA 59* 02/06/2014 1553    GFR Estimated Creatinine Clearance: 53.9 ml/min (by C-G formula based on Cr of 1.22). No results found for this basename: LIPASE, AMYLASE,  in the last 72 hours No results found for this basename: CKTOTAL, CKMB, CKMBINDEX, TROPONINI,  in the last 72 hours No components found with this basename: POCBNP,  No results found for this basename: DDIMER,  in the last 72 hours No results found for this basename: HGBA1C,  in the last 72 hours No results found for this basename: CHOL, HDL, LDLCALC, TRIG, CHOLHDL, LDLDIRECT,  in the last 72 hours  Recent Labs  03/22/14 1038  TSH 2.290  T4TOTAL 6.8  T3FREE 2.8   No results found for this basename: VITAMINB12, FOLATE, FERRITIN, TIBC, IRON, RETICCTPCT,  in the last 72 hours Coags: No results found for this basename: PT, INR,  in the last 72 hours Microbiology: Recent Results (from the past 240 hour(s))  CLOSTRIDIUM DIFFICILE BY PCR     Status: Abnormal   Collection Time    03/20/14  8:01 PM      Result Value Ref Range Status   C difficile by pcr POSITIVE (*) NEGATIVE Final   Comment: CRITICAL RESULT CALLED TO, READ BACK BY AND VERIFIED WITH:     MOSS P RN 03/21/14 0740 COSTELLO B  STOOL CULTURE     Status: None   Collection Time    03/20/14  8:01 PM      Result Value Ref Range Status   Specimen Description STOOL   Final   Special Requests Normal   Final   Culture     Final   Value: NO SUSPICIOUS COLONIES, CONTINUING TO HOLD  Performed at [facility]   Report Status PENDING   Incomplete  OVA AND PARASITE EXAMINATION     Status: None   Collection Time    03/20/14  8:01 PM      Result Value Ref Range Status   Specimen Description STOOL   Final   Special  Requests Normal   Final   Ova and parasites     Final   Value: NO OVA OR PARASITES SEEN     Performed at [facility]   Report Status 03/21/2014 FINAL   Final     BRIEF HOSPITAL COURSE:  C. difficile diarrhea  -admitted and started on Flagyl, significantly improved by the day of discharge. Infact no BM since yesterday, stool yesterday was more formed. Minimize Antibiotic use and PPI. -continue Flagyl for 7 more days, c/w  Florastor on discharge  Active Problems:  Syncopal episode  -Secondary to orthostasis from diarrhea,Improved with IV fluids  -No longer orthostatic.   Sinus Bradycardia  -mostly at rest, good chronotropic response to exercise with increase in HR  -TSH within normal limits, cardizem has been discontinued.   HYPERTENSION  -controlled without the use of anti-hypertensives at this time.   Hyperlipidemia  Continue simvastatin   History of coronary artery disease and TIA  -On aspirin and Plavix, stable at present   Type 2 diabetes  -CBGs well controlled by diet  -Hemoglobin A1c was 5.9 on 02/06/2014   Acute Renal Failure  -likely secondary to diarrhea, resolved with IVF and supportive care.  TODAY-DAY OF DISCHARGE:  Subjective:   John [name] today has no headache,no chest abdominal pain,no new weakness tingling or numbness, feels much better wants to go home today.   Objective:   Blood pressure 142/82, pulse 90, temperature 99.9 F (37.7 C), temperature source Oral, resp. rate 20, height 5' 11.5" (1.816 m), weight 75.206 kg (165 lb 12.8 oz), SpO2 93.00%.  Intake/Output Summary (Last 24 hours) at 03/24/14 1054 Last data filed at 03/24/14 0639  Gross per 24 hour  Intake 1762.5 ml  Output      0 ml  Net 1762.5 ml   Filed Weights   03/20/14 1117 03/20/14 1545  Weight: 78.019 kg (172 lb) 75.206 kg (165 lb 12.8 oz)    Exam Awake Alert, Oriented *3, No new F.N deficits, Normal affect [place].AT,PERRAL Supple Neck,No JVD, No cervical  lymphadenopathy appriciated.  Symmetrical Chest wall movement, Good air movement bilaterally, CTAB RRR,No Gallops,Rubs or new Murmurs, No Parasternal Heave +ve B.Sounds, Abd Soft, Non tender, No organomegaly appriciated, No rebound -guarding or rigidity. No Cyanosis, Clubbing or edema, No new Rash or bruise  DISCHARGE CONDITION: Stable  DISPOSITION: Home  DISCHARGE INSTRUCTIONS:    Activity:  As tolerated   Diet recommendation: Diabetic Diet Heart Healthy diet      Discharge Instructions   Call MD for:  persistant nausea and vomiting    Complete by:  As directed      Call MD for:  severe uncontrolled pain    Complete by:  As directed      Call MD for:    Complete by:  As directed   If diarrhea worsens     Diet - low sodium heart healthy    Complete by:  As directed      Increase activity slowly    Complete by:  As directed            Follow-up Information   Follow up with Nadean Corwin, MD. Schedule  an appointment as soon as possible for a visit in 1 week.   Specialty:  Internal Medicine   Contact information:   [address] [place] [address] [phone]       Total Time spent on discharge equals 45 minutes.  Signed: Henreitta Leber Cristiano Capri 03/24/2014 10:54 AM  **Disclaimer: This note may have been dictated with voice recognition software. Similar sounding words can inadvertently be transcribed and this note may contain transcription errors which may not have been corrected upon publication of note.**

## 2014-03-27 ENCOUNTER — Other Ambulatory Visit: Payer: Self-pay | Admitting: *Deleted

## 2014-03-27 MED ORDER — ALPRAZOLAM 1 MG PO TABS: 1.0000 mg | ORAL_TABLET | Freq: Three times a day (TID) | ORAL | Status: DC | PRN

## 2014-04-02 ENCOUNTER — Encounter: Payer: Self-pay | Admitting: Internal Medicine

## 2014-04-02 ENCOUNTER — Ambulatory Visit (INDEPENDENT_AMBULATORY_CARE_PROVIDER_SITE_OTHER): Payer: Medicare Other | Admitting: Internal Medicine

## 2014-04-02 VITALS — BP 116/60 | HR 72 | Temp 97.7°F | Resp 16 | Ht 71.5 in | Wt 175.0 lb

## 2014-04-02 DIAGNOSIS — A0472 Enterocolitis due to Clostridium difficile, not specified as recurrent: Secondary | ICD-10-CM

## 2014-04-02 DIAGNOSIS — Z79899 Other long term (current) drug therapy: Secondary | ICD-10-CM

## 2014-04-02 LAB — CBC WITH DIFFERENTIAL/PLATELET
Basophils Absolute: 0.1 10*3/uL (ref 0.0–0.1)
Basophils Relative: 1 % (ref 0–1)
Eosinophils Absolute: 0.5 10*3/uL (ref 0.0–0.7)
Eosinophils Relative: 10 % — ABNORMAL HIGH (ref 0–5)
HEMATOCRIT: 40.8 % (ref 39.0–52.0)
HEMOGLOBIN: 13.9 g/dL (ref 13.0–17.0)
LYMPHS PCT: 33 % (ref 12–46)
Lymphs Abs: 1.7 10*3/uL (ref 0.7–4.0)
MCH: 31.3 pg (ref 26.0–34.0)
MCHC: 34.1 g/dL (ref 30.0–36.0)
MCV: 91.9 fL (ref 78.0–100.0)
MONO ABS: 0.5 10*3/uL (ref 0.1–1.0)
MONOS PCT: 9 % (ref 3–12)
Neutro Abs: 2.4 10*3/uL (ref 1.7–7.7)
Neutrophils Relative %: 47 % (ref 43–77)
Platelets: 258 10*3/uL (ref 150–400)
RBC: 4.44 MIL/uL (ref 4.22–5.81)
RDW: 14.8 % (ref 11.5–15.5)
WBC: 5 10*3/uL (ref 4.0–10.5)

## 2014-04-02 MED ORDER — PROMETHAZINE HCL 25 MG PO TABS: ORAL_TABLET | ORAL | Status: DC

## 2014-04-02 NOTE — Progress Notes (Signed)
Subjective:    Patient ID: John [name], male    DOB: 26-Nov-1934, 79 y.o.   MRN: [id number]  HPI very nice 79 yo MWM w/ HTN, ASCAD, Diet controlled T2_NIDDMw/ CKD whoi was recently hospitalized 5/27-31 with dehydration from C.Diff enterocolitis and had a prompt response to Tx with Flagyl with resolution of diarrhea since hospitalization. Pt completes 7 day post - hosp Flagyl today. BM's are now formed.   Medication Sig  . ALPRAZolam (XANAX) 1 MG tablet Take 1 tablet (1 mg total) by mouth 3 (three) times daily as needed for anxiety.  [name] aspirin 81 MG tablet Take 81 mg by mouth daily.    . clopidogrel (PLAVIX) 75 MG tablet Take 75 mg by mouth daily.    . famotidine (PEPCID) 20 MG tablet Take 1 tablet (20 mg total) by mouth 2 (two) times daily.  .  (FREESTYLE LITE) test strips 1 each by Other route as needed for other. Use as instructed  . Lancets (FREESTYLE) lancets 1 each by Other route as needed for other. Use as instructed  . LOTEMAX 0.5 % GEL Apply 1 drop to eye daily.   . metroNIDAZOLE500 MG tablet Take 1 tablet (500 mg total) by mouth every 8 (eight) hours.  [name] NITROSTAT 0.4 MG SL tablet  as needed for Angina   . UROCIT-K) 10 MEQ  SR tab Take 10 mEq by mouth 2 (two) times daily.  [name] PREVALITE 4 G packet Take 1 packet by mouth 2 (two) times daily.  . promethazine  25 MG tablet Take 25 mg by mouth every 8 (eight) hours as needed for nausea.   [name] QUEtiapine (SEROQUEL) 25 MG tablet Take 1 tablet (25 mg total) by mouth 3 (three) times daily.  [name] saccharomyces boulardii (FLORASTOR)  Take 1 capsule (250 mg total) by mouth 2 (two) times daily.  . simvastatin  80 MG tablet Take 40 mg by mouth at bedtime. Takes 1/2 tab=40mg   . VITAMIN D3)2000 UNITS caps Take 2,000 Units by mouth daily.     Allergies  Allergen Reactions  . Beta Adrenergic Blockers     REACTION: Bradycardia  . Gabapentin     Severe confusion  . Prozac [Fluoxetine Hcl]   . Soma [Carisoprodol]    Past Medical History   Diagnosis Date  . TIA   . SYNCOPE   . HYPERTENSION   . HYPERLIPIDEMIA   . CAD, ARTERY BYPASS GRAFT   . SUPRAVENTRICULAR TACHYCARDIA, HX OF   . Leukodystrophy   . GASTROESOPHAGEAL REFLUX DISEASE   . ASTHMA   . Diverticulosis   . Hiatal hernia   . Insomnia   . Anxiety   . Blood transfusion     "related to OHS, ulcers"  . Ulcer   . Colon polyps     adenomatous polyps  . COPD   . Sleep apnea     "don't always wear it" (03/20/2014)  . TIA (transient ischemic attack) ~ 2010  . Arthritis     "lower back; left ankle" (03/20/2014)  . DIABETES MELLITUS, TYPE II   . Nephrolithiasis     "I've had over 320 kidney stones" (03/20/2014)  . Dementia     "I'm in the early stages of dementia" (03/20/2014)   Past Surgical History  Procedure Laterality Date  . Cardiac catheterization    . Gastrectomy    . Vagotomy    . Cystourethroscopy with stent removal.    . Irrigation and debridement sebaceous cyst      "  off my back  . Colonoscopy    . Upper gastrointestinal endoscopy    . Excisional hemorrhoidectomy    . Polypectomy    . Coronary artery bypass graft  1996    CABG X2  . Prostate surgery      "took the center of my prostate out"  . Cystoscopy with ureteroscopy, stone basketry and stent placement    . Ankle fracture surgery Left ~ 2012  . Hiatal hernia repair     Review of Systems In addition to the HPI above,  No Fever-chills,  No Headache, No changes with Vision or hearing,  No problems swallowing food or Liquids,  No Chest pain or productive Cough or Shortness of Breath,  No Abdominal pain, No Nausea or Vommitting, Bowel movements are regular,  No Blood in stool or Urine,  No dysuria,  No new skin rashes or bruises,  No new joints pains-aches,  No new weakness, tingling, numbness in any extremity,  No recent weight loss,  No polyuria, polydypsia or polyphagia,  A full 10 point Review of Systems was done, except as stated above, all other Review of Systems were  negative  Objective:   Physical Exam  BP 116/60  Pulse 72  Temp 97.7 F   Resp 16  Ht 5' 11.5"   Wt 175 lb   BMI 24.07 kg/m2'  HEENT - Eac's patent. TM's Nl.EOM's full. PERRLA. NasoOroPharynx clear. Neck - supple. Nl Thyroid. No bruits nodes JVD Chest - Clear equal BS Cor - Nl HS. RRR w/o sig MGR. PP 1(+) No edema. Abd - No palpable organomegaly, masses or tenderness. BS nl. MS- FROM. w/o deformities. Muscle power tone and bulk Nl. Gait Nl. Neuro - No obvious Cr N abnormalities. Sensory, motor and Cerebellar functions appear Nl w/o focal abnormalities.  Assessment & Plan:   1. C. difficile colitis, presumed treated  2. Encounter for long-term (current) use of other medications  - CBC with Differential - BASIC METABOLIC PANEL WITH GFR

## 2014-04-02 NOTE — Patient Instructions (Signed)
Clostridium Difficile Infection °Clostridium difficile (C. difficile) is a germ found in the intestines. C. difficile infection can occur after taking some medicines. C. difficile infection can cause watery poop (diarrhea) or severe disease. °HOME CARE  °· Drink enough fluids to keep your pee (urine) clear or pale yellow. Avoid milk, caffeine, and alcohol. °· Ask your doctor how to replace body fluid losses (rehydrate). °· Eat small meals more often rather than large meals. °· Take your medicine (antibiotics) as told. Finish it even if you start to feel better. °· Do not  use medicines to slow the watery poop. °· Wash your hands well after using the bathroom and before preparing food. °· Make sure people who live with you wash their hands often. °· Clean all surfaces. Use a product that contains chlorine bleach. °GET HELP RIGHT AWAY IF:  °· The watery poop does not stop, or it comes back after you finish your medicine. °· You feel very dry or thirsty (dehydrated). °· You have a fever. °· You have more belly (abdominal) pain or tenderness. °· There is blood in your poop (stool), or your poop is black and tar-like. °· You cannot eat food or drink liquids without throwing up (vomiting). °MAKE SURE YOU:  °· Understand these instructions. °· Will watch your condition. °· Will get help right away if you are not doing well or get worse. °Document Released: 08/08/2009 Document Revised: 02/05/2013 Document Reviewed: 03/19/2011 °[employer]® Patient Information ©2014 [employer]. ° °

## 2014-04-03 LAB — BASIC METABOLIC PANEL WITH GFR
BUN: 16 mg/dL (ref 6–23)
CHLORIDE: 106 meq/L (ref 96–112)
CO2: 28 mEq/L (ref 19–32)
Calcium: 8.7 mg/dL (ref 8.4–10.5)
Creat: 1.09 mg/dL (ref 0.50–1.35)
GFR, Est African American: 75 mL/min
GFR, Est Non African American: 65 mL/min
Glucose, Bld: 95 mg/dL (ref 70–99)
Potassium: 4.8 mEq/L (ref 3.5–5.3)
SODIUM: 141 meq/L (ref 135–145)

## 2014-04-23 ENCOUNTER — Other Ambulatory Visit: Payer: Self-pay | Admitting: *Deleted

## 2014-04-23 MED ORDER — PANTOPRAZOLE SODIUM 40 MG PO TBEC: 40.0000 mg | DELAYED_RELEASE_TABLET | Freq: Every day | ORAL | Status: DC

## 2014-04-29 ENCOUNTER — Ambulatory Visit (INDEPENDENT_AMBULATORY_CARE_PROVIDER_SITE_OTHER): Payer: Medicare Other | Admitting: Gastroenterology

## 2014-04-29 ENCOUNTER — Encounter: Payer: Self-pay | Admitting: Gastroenterology

## 2014-04-29 VITALS — BP 100/60 | HR 76 | Ht 71.5 in | Wt 169.6 lb

## 2014-04-29 DIAGNOSIS — Z86010 Personal history of colonic polyps: Secondary | ICD-10-CM

## 2014-04-29 DIAGNOSIS — R197 Diarrhea, unspecified: Secondary | ICD-10-CM

## 2014-04-29 MED ORDER — METRONIDAZOLE 250 MG PO TABS: 250.0000 mg | ORAL_TABLET | Freq: Three times a day (TID) | ORAL | Status: DC

## 2014-04-29 NOTE — Progress Notes (Signed)
          History of Present Illness:  Has returned for evaluation of diarrhea.  In May, 2015 he was hospitalized for syncope and severe diarrhea that tested positive for C. difficile toxin.  He was treated with metronidazole with improvement.  Over the past month he's had recurrent diarrhea up to 5 times a day or at nighttime.  He complains of progressive weakness.  He denies rectal bleeding.  Patient has a history of colonic polyposis.  A small adenomatous polyp was removed in 2014.  On a prior colonoscopy in 2013 proctitis was seen.    Review of Systems: Pertinent positive and negative review of systems were noted in the above HPI section. All other review of systems were otherwise negative.    Current Medications, Allergies, Past Medical History, Past Surgical History, Family History and Social History were reviewed in [facility] record  Vital signs were reviewed in today's medical record. Physical Exam: General: Well developed , well nourished, no acute distress Skin: anicteric Head: Normocephalic and atraumatic Eyes:  sclerae anicteric, EOMI Ears: Normal auditory acuity Mouth: No deformity or lesions Lungs: Clear throughout to auscultation Heart: Regular rate and rhythm; no murmurs, rubs or bruits Abdomen: Soft, non tender and non distended. No masses, hepatosplenomegaly or hernias noted. Normal Bowel sounds Rectal:deferred Musculoskeletal: Symmetrical with no gross deformities  Pulses:  Normal pulses noted Extremities: No clubbing, cyanosis, edema or deformities noted Neurological: Alert oriented x 4, grossly nonfocal Psychological:  Alert and cooperative. Normal mood and affect  See Assessment and Plan under Problem List

## 2014-04-29 NOTE — Patient Instructions (Signed)
Go to the basement for labs Follow up in 8 weeks We will send in your prescription to your pharmacy

## 2014-04-29 NOTE — Assessment & Plan Note (Signed)
Has only suspect the patient has recurrent pseudomembranous colitis.  Alternatively, he may have diarrhea 2 to idiopathic proctitis.  Recommendations #1 stool for C. difficile toxin #2 begin empiric therapy with Flagyl 250 mg 3 times a day #3 if stool studies are negative I will proceed with sigmoidoscopy

## 2014-04-29 NOTE — Assessment & Plan Note (Signed)
Plan followup colonoscopy in 3 years from prior colonoscopy (this is a revision from the prior recommendation of five-year followup)

## 2014-04-30 ENCOUNTER — Other Ambulatory Visit: Payer: Medicare Other

## 2014-04-30 DIAGNOSIS — R197 Diarrhea, unspecified: Secondary | ICD-10-CM

## 2014-05-02 ENCOUNTER — Telehealth: Payer: Self-pay

## 2014-05-02 ENCOUNTER — Other Ambulatory Visit: Payer: Self-pay

## 2014-05-02 LAB — CLOSTRIDIUM DIFFICILE BY PCR: CDIFFPCR: DETECTED — AB

## 2014-05-02 MED ORDER — METRONIDAZOLE 250 MG PO TABS: 250.0000 mg | ORAL_TABLET | Freq: Three times a day (TID) | ORAL | Status: DC

## 2014-05-02 NOTE — Telephone Encounter (Signed)
Pt aware and script for 4 more days worth of Flagyl sent to pharmacy.

## 2014-05-02 NOTE — Telephone Encounter (Signed)
Message copied by Algernon Huxley on Thu May 02, 2014 12:59 PM ------      Message from: Erskine Emery D      Created: Thu May 02, 2014 12:22 PM      Regarding: RE: Cdiff       He's already on flagyl.  He needs 14 day treatment.  Please inform patient.      ----- Message -----         From: Maury Dus, RN         Sent: 05/02/2014   9:46 AM           To: Inda Castle, MD      Subject: Cdiff                                                    Dr. Deatra Ina,            Pt is positive for cdiff, please advise.            Thanks,      Vaughan Basta       ------

## 2014-05-08 ENCOUNTER — Encounter: Payer: Self-pay | Admitting: Emergency Medicine

## 2014-05-08 ENCOUNTER — Ambulatory Visit (INDEPENDENT_AMBULATORY_CARE_PROVIDER_SITE_OTHER): Payer: Medicare Other | Admitting: Emergency Medicine

## 2014-05-08 VITALS — BP 124/62 | HR 60 | Temp 98.2°F | Resp 16 | Ht 71.5 in | Wt 170.0 lb

## 2014-05-08 DIAGNOSIS — R5383 Other fatigue: Secondary | ICD-10-CM

## 2014-05-08 DIAGNOSIS — D649 Anemia, unspecified: Secondary | ICD-10-CM

## 2014-05-08 DIAGNOSIS — E119 Type 2 diabetes mellitus without complications: Secondary | ICD-10-CM

## 2014-05-08 DIAGNOSIS — R5381 Other malaise: Secondary | ICD-10-CM

## 2014-05-08 DIAGNOSIS — E538 Deficiency of other specified B group vitamins: Secondary | ICD-10-CM

## 2014-05-08 DIAGNOSIS — I10 Essential (primary) hypertension: Secondary | ICD-10-CM

## 2014-05-08 DIAGNOSIS — E782 Mixed hyperlipidemia: Secondary | ICD-10-CM

## 2014-05-08 LAB — CBC WITH DIFFERENTIAL/PLATELET
Basophils Absolute: 0.1 10*3/uL (ref 0.0–0.1)
Basophils Relative: 1 % (ref 0–1)
EOS ABS: 0.3 10*3/uL (ref 0.0–0.7)
Eosinophils Relative: 5 % (ref 0–5)
HEMATOCRIT: 42.7 % (ref 39.0–52.0)
Hemoglobin: 14.6 g/dL (ref 13.0–17.0)
LYMPHS PCT: 30 % (ref 12–46)
Lymphs Abs: 1.7 10*3/uL (ref 0.7–4.0)
MCH: 31.3 pg (ref 26.0–34.0)
MCHC: 34.2 g/dL (ref 30.0–36.0)
MCV: 91.6 fL (ref 78.0–100.0)
MONO ABS: 0.5 10*3/uL (ref 0.1–1.0)
Monocytes Relative: 9 % (ref 3–12)
Neutro Abs: 3.1 10*3/uL (ref 1.7–7.7)
Neutrophils Relative %: 55 % (ref 43–77)
Platelets: 200 10*3/uL (ref 150–400)
RBC: 4.66 MIL/uL (ref 4.22–5.81)
RDW: 14.3 % (ref 11.5–15.5)
WBC: 5.6 10*3/uL (ref 4.0–10.5)

## 2014-05-08 LAB — IRON AND TIBC
%SAT: 36 % (ref 20–55)
IRON: 106 ug/dL (ref 42–165)
TIBC: 292 ug/dL (ref 215–435)
UIBC: 186 ug/dL (ref 125–400)

## 2014-05-08 LAB — BASIC METABOLIC PANEL WITH GFR
BUN: 19 mg/dL (ref 6–23)
CALCIUM: 8.9 mg/dL (ref 8.4–10.5)
CO2: 29 mEq/L (ref 19–32)
Chloride: 105 mEq/L (ref 96–112)
Creat: 1.31 mg/dL (ref 0.50–1.35)
GFR, Est African American: 60 mL/min
GFR, Est Non African American: 52 mL/min — ABNORMAL LOW
GLUCOSE: 88 mg/dL (ref 70–99)
POTASSIUM: 4.7 meq/L (ref 3.5–5.3)
Sodium: 141 mEq/L (ref 135–145)

## 2014-05-08 LAB — HEPATIC FUNCTION PANEL
ALBUMIN: 4 g/dL (ref 3.5–5.2)
ALT: 23 U/L (ref 0–53)
AST: 27 U/L (ref 0–37)
Alkaline Phosphatase: 57 U/L (ref 39–117)
BILIRUBIN DIRECT: 0.2 mg/dL (ref 0.0–0.3)
BILIRUBIN TOTAL: 0.8 mg/dL (ref 0.2–1.2)
Indirect Bilirubin: 0.6 mg/dL (ref 0.2–1.2)
Total Protein: 6.5 g/dL (ref 6.0–8.3)

## 2014-05-08 LAB — LIPID PANEL
CHOL/HDL RATIO: 2.2 ratio
CHOLESTEROL: 115 mg/dL (ref 0–200)
HDL: 53 mg/dL (ref >39)
LDL Cholesterol: 48 mg/dL (ref 0–99)
TRIGLYCERIDES: 68 mg/dL (ref <150)
VLDL: 14 mg/dL (ref 0–40)

## 2014-05-08 LAB — HEMOGLOBIN A1C
Hgb A1c MFr Bld: 5.7 % — ABNORMAL HIGH (ref <5.7)
Mean Plasma Glucose: 117 mg/dL — ABNORMAL HIGH (ref <117)

## 2014-05-08 LAB — VITAMIN B12: Vitamin B-12: 318 pg/mL (ref 211–911)

## 2014-05-08 NOTE — Patient Instructions (Signed)
Anemia, Nonspecific Anemia is a condition in which the concentration of red blood cells or hemoglobin in the blood is below normal. Hemoglobin is a substance in red blood cells that carries oxygen to the tissues of the body. Anemia results in not enough oxygen reaching these tissues.  CAUSES  Common causes of anemia include:   Excessive bleeding. Bleeding may be internal or external. This includes excessive bleeding from periods (in women) or from the intestine.   Poor nutrition.   Chronic kidney, thyroid, and liver disease.  Bone marrow disorders that decrease red blood cell production.  Cancer and treatments for cancer.  HIV, AIDS, and their treatments.  Spleen problems that increase red blood cell destruction.  Blood disorders.  Excess destruction of red blood cells due to infection, medicines, and autoimmune disorders. SIGNS AND SYMPTOMS   Minor weakness.   Dizziness.   Headache.  Palpitations.   Shortness of breath, especially with exercise.   Paleness.  Cold sensitivity.  Indigestion.  Nausea.  Difficulty sleeping.  Difficulty concentrating. Symptoms may occur suddenly or they may develop slowly.  DIAGNOSIS  Additional blood tests are often needed. These help your health care provider determine the best treatment. Your health care provider will check your stool for blood and look for other causes of blood loss.  TREATMENT  Treatment varies depending on the cause of the anemia. Treatment can include:   Supplements of iron, vitamin B12, or folic acid.   Hormone medicines.   A blood transfusion. This may be needed if blood loss is severe.   Hospitalization. This may be needed if there is significant continual blood loss.   Dietary changes.  Spleen removal. HOME CARE INSTRUCTIONS Keep all follow-up appointments. It often takes many weeks to correct anemia, and having your health care provider check on your condition and your response to  treatment is very important. SEEK IMMEDIATE MEDICAL CARE IF:   You develop extreme weakness, shortness of breath, or chest pain.   You become dizzy or have trouble concentrating.  You develop heavy vaginal bleeding.   You develop a rash.   You have bloody or black, tarry stools.   You faint.   You vomit up blood.   You vomit repeatedly.   You have abdominal pain.  You have a fever or persistent symptoms for more than 2-3 days.   You have a fever and your symptoms suddenly get worse.   You are dehydrated.  MAKE SURE YOU:  Understand these instructions.  Will watch your condition.  Will get help right away if you are not doing well or get worse. Document Released: 11/18/2004 Document Revised: 06/13/2013 Document Reviewed: 04/06/2013 Exit[employer] Patient Information 2015 [employer]. This information is not intended to replace advice given to you by your health care provider. Make sure you discuss any questions you have with your health care provider.  

## 2014-05-08 NOTE — Progress Notes (Signed)
Subjective:    Patient ID: John [name], male    DOB: [birth year], 79 y.o.   MRN: [id number]  HPI Comments: 79 yo WM presents for 3 month F/U for HTN, Cholesterol, Pre-Dm, D. Deficient. He notes his appetite has been decreased and he has not regained his energy since recent hospitalization with GI c-diff. He has been on multiple rounds of FLAGYL and had been doing well until diarrhea restarted yesterday. He has f/u GI tomorrow. He notes he has been off probiotics accidentally. He has been trying to keep active but difficult due to lack of energy. He has had several episodes of almost syncope.  He has been having difficulty sleeping again. He notes some nights he takes CPAP mask off. He would like to try to change sleep medicine.  WBC             5.0   04/02/2014 HGB            13.9   04/02/2014 HCT            40.8   04/02/2014 PLT             258   04/02/2014 GLUCOSE          95   04/02/2014 CHOL            147   02/06/2014 TRIG             80   02/06/2014 HDL              57   02/06/2014 LDLCALC          74   02/06/2014 ALT               7   03/21/2014 AST              11   03/21/2014 NA              141   04/02/2014 K               4.8   04/02/2014 CL              106   04/02/2014 CREATININE     1.09   04/02/2014 BUN              16   04/02/2014 CO2              28   04/02/2014 TSH           2.290   03/22/2014 INR            1.12   03/13/2011 HGBA1C          5.9   02/06/2014   Hyperlipidemia  Gastrophageal Reflux Associated symptoms include fatigue.     Medication List       This list is accurate as of: 05/08/14  3:10 PM.  Always use your most recent med list.               ALPRAZolam 1 MG tablet  Commonly known as:  XANAX  Take 1 tablet (1 mg total) by mouth 3 (three) times daily as needed for anxiety.     aspirin 81 MG tablet  Take 81 mg by mouth daily.     CARTIA XT 120 MG 24 hr capsule  Generic drug:  diltiazem     clopidogrel 75 MG tablet  Commonly known as:  PLAVIX  Take 75 mg  by mouth daily.  freestyle lancets  1 each by Other route as needed for other. Use as instructed     FREESTYLE LITE test strip  Generic drug:  glucose blood  1 each by Other route as needed for other. Use as instructed     loperamide 2 MG capsule  Commonly known as:  IMODIUM  Take 2 mg by mouth as needed for diarrhea or loose stools.     LOTEMAX 0.5 % Gel  Generic drug:  Loteprednol Etabonate  Apply 1 drop to eye daily.     metroNIDAZOLE 250 MG tablet  Commonly known as:  FLAGYL  Take 1 tablet (250 mg total) by mouth 3 (three) times daily.     nitroGLYCERIN 0.4 MG SL tablet  Commonly known as:  NITROSTAT  Sig: 1 tablet under tongue every 3 to 5 minutes as needed for Angina - Please Dispense # 2 bottles of # 25 tabs     pantoprazole 40 MG tablet  Commonly known as:  PROTONIX  Take 1 tablet (40 mg total) by mouth daily.     potassium citrate 10 MEQ (1080 MG) SR tablet  Commonly known as:  UROCIT-K  Take 10 mEq by mouth 2 (two) times daily.     promethazine 25 MG tablet  Commonly known as:  PHENERGAN  Take 1 to 2 tablets every 4 hours as needed for nausea or vomitting     QUEtiapine 25 MG tablet  Commonly known as:  SEROQUEL  Take 1 tablet (25 mg total) by mouth 3 (three) times daily.     saccharomyces boulardii 250 MG capsule  Commonly known as:  FLORASTOR  Take 1 capsule (250 mg total) by mouth 2 (two) times daily.     simvastatin 80 MG tablet  Commonly known as:  ZOCOR  Take 40 mg by mouth at bedtime. Takes 1/2 tab=40mg      Vitamin D3 2000 UNITS capsule  Take 2,000 Units by mouth daily.       Allergies  Allergen Reactions  . Beta Adrenergic Blockers     REACTION: Bradycardia  . Gabapentin     Severe confusion  . Prozac [Fluoxetine Hcl]   . Soma [Carisoprodol]    Past Medical History  Diagnosis Date  . TIA   . SYNCOPE   . HYPERTENSION   . HYPERLIPIDEMIA   . CAD, ARTERY BYPASS GRAFT   . SUPRAVENTRICULAR TACHYCARDIA, HX OF   . Leukodystrophy   .  GASTROESOPHAGEAL REFLUX DISEASE   . ASTHMA   . Diverticulosis   . Hiatal hernia   . Insomnia   . Anxiety   . Blood transfusion     "related to OHS, ulcers"  . Ulcer   . Colon polyps     adenomatous polyps  . COPD   . Sleep apnea     "don't always wear it" (03/20/2014)  . TIA (transient ischemic attack) ~ 2010  . Arthritis     "lower back; left ankle" (03/20/2014)  . DIABETES MELLITUS, TYPE II   . Nephrolithiasis     "I've had over 320 kidney stones" (03/20/2014)  . Dementia     "I'm in the early stages of dementia" (03/20/2014)     Review of Systems  Constitutional: Positive for fatigue.  Gastrointestinal: Positive for diarrhea.  Psychiatric/Behavioral: Positive for sleep disturbance.   BP 124/62  Pulse 60  Temp(Src) 98.2 F (36.8 C) (Temporal)  Resp 16  Ht 5' 11.5" (1.816 m)  Wt 170 lb (77.111 kg)  BMI 23.38 kg/m2  Objective:   Physical Exam  Nursing note and vitals reviewed. Constitutional: He is oriented to person, place, and time. He appears well-developed and well-nourished.  HENT:  Head: Normocephalic and atraumatic.  Right Ear: External ear normal.  Left Ear: External ear normal.  Nose: Nose normal.  Eyes: Conjunctivae and EOM are normal.  Neck: Normal range of motion. Neck supple. No JVD present. No thyromegaly present.  Cardiovascular: Normal rate, regular rhythm, normal heart sounds and intact distal pulses.   Pulmonary/Chest: Effort normal and breath sounds normal.  Abdominal: Soft. Bowel sounds are normal. He exhibits no distension and no mass. There is no tenderness. There is no rebound and no guarding.  Musculoskeletal: Normal range of motion. He exhibits no edema and no tenderness.  Lymphadenopathy:    He has no cervical adenopathy.  Neurological: He is alert and oriented to person, place, and time. No cranial nerve deficit. Coordination normal.  Skin: Skin is warm and dry.  Psychiatric: He has a normal mood and affect. His behavior is normal.  Judgment and thought content normal.          Assessment & Plan:  1.  3 month F/U for HTN, Cholesterol,DM, D. Deficient. Needs healthy diet, cardio QD and obtain healthy weight. Check Labs, Check BP if >130/80 call office, Check BS if >200 call office   2. Fatigue vs anemia- check labs, increase activity and H2O, Concern for need for mask change with cpap difficulties. Advised of increased of falls with sleep medications, prefer no change at this time  3. Diarrhea with Cdiff HX- Keep GI f/u Hygiene again discussed, restart Probiotic

## 2014-05-09 LAB — FOLATE RBC: RBC Folate: 236 ng/mL — ABNORMAL LOW (ref >280)

## 2014-06-03 ENCOUNTER — Encounter: Payer: Self-pay | Admitting: Gastroenterology

## 2014-07-09 ENCOUNTER — Telehealth: Payer: Self-pay | Admitting: *Deleted

## 2014-07-09 MED ORDER — PROCHLORPERAZINE MALEATE 5 MG PO TABS: ORAL_TABLET | ORAL | Status: DC

## 2014-07-09 NOTE — Telephone Encounter (Signed)
Patient called and states is has a virus with nausea.  Per Dr Melford Aase, [place] to send RX for Compazine.

## 2014-07-10 ENCOUNTER — Ambulatory Visit (INDEPENDENT_AMBULATORY_CARE_PROVIDER_SITE_OTHER): Payer: Medicare Other | Admitting: Gastroenterology

## 2014-07-10 ENCOUNTER — Encounter: Payer: Self-pay | Admitting: Gastroenterology

## 2014-07-10 VITALS — BP 100/66 | HR 96 | Ht 70.0 in | Wt 165.1 lb

## 2014-07-10 DIAGNOSIS — A0472 Enterocolitis due to Clostridium difficile, not specified as recurrent: Secondary | ICD-10-CM

## 2014-07-10 NOTE — Patient Instructions (Signed)
Please follow up with Dr. Deatra Ina as needed

## 2014-07-10 NOTE — Progress Notes (Signed)
      History of Present Illness:  John [name] has returned for followup of diarrhea.  He had recurrent pseudomembranous colitis treated with a prolonged course of Flagyl.  At this point he is symptom-free.  He has no other GI complaints the    Review of Systems: Pertinent positive and negative review of systems were noted in the above HPI section. All other review of systems were otherwise negative.    Current Medications, Allergies, Past Medical History, Past Surgical History, Family History and Social History were reviewed in [facility] record  Vital signs were reviewed in today's medical record. Physical Exam: General: Well developed , well nourished, no acute distress   See Assessment and Plan under Problem List

## 2014-07-10 NOTE — Assessment & Plan Note (Signed)
Recurrent Pseudomonas colitis clinically resolved.  Patient was admonished to try to avoid antibiotics as much as possible.

## 2014-08-05 ENCOUNTER — Encounter: Payer: Self-pay | Admitting: Internal Medicine

## 2014-08-05 ENCOUNTER — Ambulatory Visit (INDEPENDENT_AMBULATORY_CARE_PROVIDER_SITE_OTHER): Payer: Medicare Other | Admitting: *Deleted

## 2014-08-05 DIAGNOSIS — Z23 Encounter for immunization: Secondary | ICD-10-CM

## 2014-08-27 ENCOUNTER — Encounter: Payer: Self-pay | Admitting: Internal Medicine

## 2014-08-27 ENCOUNTER — Ambulatory Visit (INDEPENDENT_AMBULATORY_CARE_PROVIDER_SITE_OTHER): Payer: Medicare Other | Admitting: Internal Medicine

## 2014-08-27 VITALS — BP 122/66 | HR 56 | Temp 97.9°F | Resp 16 | Ht 71.75 in | Wt 162.4 lb

## 2014-08-27 DIAGNOSIS — E1122 Type 2 diabetes mellitus with diabetic chronic kidney disease: Secondary | ICD-10-CM

## 2014-08-27 DIAGNOSIS — I10 Essential (primary) hypertension: Secondary | ICD-10-CM

## 2014-08-27 DIAGNOSIS — Z79899 Other long term (current) drug therapy: Secondary | ICD-10-CM

## 2014-08-27 DIAGNOSIS — N189 Chronic kidney disease, unspecified: Secondary | ICD-10-CM

## 2014-08-27 DIAGNOSIS — R6889 Other general symptoms and signs: Secondary | ICD-10-CM

## 2014-08-27 DIAGNOSIS — Z1331 Encounter for screening for depression: Secondary | ICD-10-CM

## 2014-08-27 DIAGNOSIS — E1121 Type 2 diabetes mellitus with diabetic nephropathy: Secondary | ICD-10-CM

## 2014-08-27 DIAGNOSIS — Z9181 History of falling: Secondary | ICD-10-CM

## 2014-08-27 DIAGNOSIS — Z1212 Encounter for screening for malignant neoplasm of rectum: Secondary | ICD-10-CM

## 2014-08-27 DIAGNOSIS — K2100 Gastro-esophageal reflux disease with esophagitis, without bleeding: Secondary | ICD-10-CM

## 2014-08-27 DIAGNOSIS — E559 Vitamin D deficiency, unspecified: Secondary | ICD-10-CM

## 2014-08-27 DIAGNOSIS — Z0001 Encounter for general adult medical examination with abnormal findings: Secondary | ICD-10-CM

## 2014-08-27 DIAGNOSIS — E782 Mixed hyperlipidemia: Secondary | ICD-10-CM

## 2014-08-27 DIAGNOSIS — Z23 Encounter for immunization: Secondary | ICD-10-CM

## 2014-08-27 DIAGNOSIS — Z125 Encounter for screening for malignant neoplasm of prostate: Secondary | ICD-10-CM

## 2014-08-27 DIAGNOSIS — I25709 Atherosclerosis of coronary artery bypass graft(s), unspecified, with unspecified angina pectoris: Secondary | ICD-10-CM

## 2014-08-27 LAB — CBC WITH DIFFERENTIAL/PLATELET
BASOS ABS: 0 10*3/uL (ref 0.0–0.1)
Basophils Relative: 1 % (ref 0–1)
Eosinophils Absolute: 0.2 10*3/uL (ref 0.0–0.7)
Eosinophils Relative: 4 % (ref 0–5)
HCT: 42.3 % (ref 39.0–52.0)
Hemoglobin: 14.8 g/dL (ref 13.0–17.0)
LYMPHS ABS: 2 10*3/uL (ref 0.7–4.0)
LYMPHS PCT: 47 % — AB (ref 12–46)
MCH: 31.4 pg (ref 26.0–34.0)
MCHC: 35 g/dL (ref 30.0–36.0)
MCV: 89.8 fL (ref 78.0–100.0)
MONO ABS: 0.3 10*3/uL (ref 0.1–1.0)
Monocytes Relative: 7 % (ref 3–12)
NEUTROS ABS: 1.8 10*3/uL (ref 1.7–7.7)
Neutrophils Relative %: 41 % — ABNORMAL LOW (ref 43–77)
Platelets: 191 10*3/uL (ref 150–400)
RBC: 4.71 MIL/uL (ref 4.22–5.81)
RDW: 14 % (ref 11.5–15.5)
WBC: 4.3 10*3/uL (ref 4.0–10.5)

## 2014-08-27 NOTE — Progress Notes (Signed)
Patient ID: John [name], male   DOB: April 16, 1935, 79 y.o.   MRN: [id number]  MEDICARE ANNUAL WELLNESS& PREVENTATIVE VISIT AND CPE  Assessment:   1. Encounter for general adult medical examination with abnormal findings   2. Essential hypertension  - Microalbumin / creatinine urine ratio - EKG 12-Lead - [place], RETROPERITNL ABD,  LTD - TSH  3. Mixed hyperlipidemia  - Lipid panel  4. Type 2 diabetes mellitus with diabetic chronic kidney disease  - HM DIABETES FOOT EXAM - LOW EXTREMITY NEUR EXAM DOCUM - Hemoglobin A1c - Insulin, fasting  5. Vitamin D deficiency  - Vit D  25 hydroxy (rtn osteoporosis monitoring)  6. GERD   7. 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 Screening for rectal cancer  - POC Hemoccult Bld/Stl (3-Cd Home Screen); Future  9. Medication management  - Urine Microscopic - CBC with Differential - BASIC METABOLIC PANEL WITH GFR - Hepatic function panel - Magnesium  10. Screening for prostate cancer  - PSA  11. At low risk for fall   12. Depression screening   13. Type 2 diabetes mellitus with diabetic nephropathy   14. Need for prophylactic vaccination against Streptococcus pneumoniae (pneumococcus)  - Pneumococcal conjugate vaccine 13-valent  15. Recurrent Nephrolithiasis (passed >300+ stones - none in the last 2 years)  Plan:   During the course of the visit the patient was educated and counseled about appropriate screening and preventive services including:    Pneumococcal vaccine   Influenza vaccine  Td vaccine  Screening electrocardiogram  Bone densitometry screening  Colorectal cancer screening  Diabetes screening  Glaucoma screening  Nutrition counseling   Advanced directives: requested  Screening recommendations, referrals: Vaccinations: Tdap vaccine 08/01/2013 Influenza vaccine HD 08/05/2014 Pneumococcal vaccine 08/06/2012 Prevnar vaccine  08/27/2014 Shingles vaccine 05/02/2006 Hep B vaccine not indicated  Nutrition assessed and recommended  Colonoscopy 09/06/2013 Recommended yearly ophthalmology/optometry visit for glaucoma screening and checkup Recommended yearly dental visit for hygiene and checkup Advanced directives - yes  Conditions/risks identified: BMI: Discussed weight loss, diet, and increase physical activity.  Increase physical activity: AHA recommends 150 minutes of physical activity a week.  Medications reviewed Diabetes is at goal, ACE/ARB therapy: No, Reason not on Ace Inhibitor/ARB therapy:  dysautonomia with labile HYPOTENSION. Urinary Incontinence is not an issue: discussed non pharmacology and pharmacology options.  Fall risk: low- discussed PT, home fall assessment, medications.    Subjective:  John [name] is a 79 y.o. male who presents for Medicare Annual Wellness Visit and complete physical.  Date of last medicare wellness visit is unknown.  He has had elevated blood pressure since 1960. His blood pressure has been controlled at home, today their BP is BP: 122/66 mmHg He does not workout. He denies chest pain, shortness of breath, dizziness.  He is on cholesterol medication and denies myalgias. His cholesterol is at goal. The cholesterol last visit was:  Lab Results  Component Value Date   CHOL 115 05/08/2014   HDL 53 05/08/2014   LDLCALC 48 05/08/2014   TRIG 68 05/08/2014   CHOLHDL 2.2 05/08/2014   He has had diabetes for 16 years since 1999. He has been working on diet and exercise for prediabetes, and denies foot ulcerations, hyperglycemia, hypoglycemia , nausea, paresthesia of the feet, polydipsia, polyuria and visual disturbances. Last A1C in the office was:  Lab Results  Component Value Date   HGBA1C 5.7* 05/08/2014   Patient is on Vitamin D  supplement.  (Vit D 18 in 2008)  Lab Results  Component Value Date   VD25OH 59 02/06/2014      Names of Other Physician/Practitioners you  currently use: 1. [facility] here for primary care 2. Dr Claudean Kinds & Anette Guarneri, eye doctor, last visit for L.CE/IOL surg on Oct 27. 3. Dr Russella Dar, dentist, visits every 6 months  Patient Care Team: Unk Pinto, MD as PCP - General Burnell Blanks, MD as Consulting Physician (Cardiology) Inda Castle, MD as Consulting Physician (Gastroenterology) Jannette Spanner, MD as Referring Physician (Dermatology)  Medication Review: Medication Sig  . ALPRAZolam (XANAX) 1 MG tablet Take 1 tablet (1 mg total) by mouth 3 (three) times daily as needed for anxiety.  [name] aspirin 81 MG tablet Take 81 mg by mouth daily.    [name] CARTIA XT 120 MG 24 hr capsule Take 120 mg by mouth daily.   . Cholecalciferol (VITAMIN D3) 2000 UNITS capsule Take 2,000 Units by mouth daily.    . clopidogrel (PLAVIX) 75 MG tablet Take 75 mg by mouth daily.    [name] glucose blood (FREESTYLE LITE) test strip 1 each by Other route as needed for other. Use as instructed  . Lancets (FREESTYLE) lancets 1 each by Other route as needed for other. Use as instructed  . loperamide (IMODIUM) 2 MG capsule Take 2 mg by mouth as needed for diarrhea or loose stools.  . nitroGLYCERIN (NITROSTAT) 0.4 MG SL tablet Sig: 1 tablet under tongue every 3 to 5 minutes as needed for Angina - Please Dispense # 2 bottles of # 25 tabs  . pantoprazole (PROTONIX) 40 MG tablet Take 1 tablet (40 mg total) by mouth daily.  . potassium citrate (UROCIT-K) 10 MEQ (1080 MG) SR tablet Take 10 mEq by mouth 2 (two) times daily.  . prochlorperazine (COMPAZINE) 5 MG tablet Take 1-2 tablets TID PRN for nausea.  . promethazine (PHENERGAN) 25 MG tablet Take 1 to 2 tablets every 4 hours as needed for nausea or vomitting  . QUEtiapine (SEROQUEL) 25 MG tablet Take 1 tablet (25 mg total) by mouth 3 (three) times daily.  . simvastatin (ZOCOR) 80 MG tablet Take 40 mg by mouth at bedtime. Takes 1/2 tab=40mg    Current Problems  (verified) Patient Active Problem List   Diagnosis Date Noted  . C. difficile diarrhea 03/21/2014  . Hypotension 03/20/2014  . Diarrhea 03/20/2014  . Syncope 03/20/2014  . CKD (chronic kidney disease) 03/20/2014  . Medication management 02/06/2014  . Vitamin D deficiency 02/06/2014  . Gastric AV malformation 10/30/2012  . Duodenal ulcer without hemorrhage or perforation 12/29/2011  . Personal history of colonic polyps 09/14/2011  . Type II diabetes mellitus with renal manifestations 03/12/2009  . Mixed hyperlipidemia 03/12/2009  . Essential hypertension 03/12/2009  . ASCAD/CABG 03/12/2009  . TIA 03/12/2009  . ASTHMA 03/12/2009  . COPD 03/12/2009  . GERD 03/12/2009  . SUPRAVENTRICULAR TACHYCARDIA, HX OF 03/12/2009  . NEPHROLITHIASIS, HX OF 03/12/2009    Screening Tests Health Maintenance  Topic Date Due  . FOOT EXAM  12/22/1946  . URINE MICROALBUMIN  12/22/1946  . OPHTHALMOLOGY EXAM  11/30/2013  . HEMOGLOBIN A1C  11/08/2014  . INFLUENZA VACCINE  05/26/2015  . COLONOSCOPY  09/06/2018  . TETANUS/TDAP  08/02/2023  . PNEUMOCOCCAL POLYSACCHARIDE VACCINE AGE 66 AND OVER  Completed  . ZOSTAVAX  Completed    Immunization History  Administered Date(s) Administered  . Influenza, High Dose Seasonal PF 08/13/2013, 08/05/2014  . Pneumococcal  Conjugate-13 08/27/2014  . Pneumococcal Polysaccharide-23 08/06/2012  . Tdap 08/01/2013  . Zoster 05/02/2006    Preventative care: Last colonoscopy: 09/06/2013  Prior vaccinations: Tdap: 07/2013  Influenza: HD 07/2014  Pneumococcal: 07/2012 Prevnar: 08/27/2014 Shingles/Zostavax: 04/2006  History reviewed: allergies, current medications, past family history, past medical history, past social history, past surgical history and problem list  Risk Factors: Tobacco History  Substance Use Topics  . Smoking status: Former Smoker -- 0.50 packs/day    Types: Cigarettes    Quit date: 10/25/1962  . Smokeless tobacco: Former Systems developer    Types:  Chew  . Alcohol Use: No   He does not smoke.  Patient is a former smoker. Are there smokers in your home (other than you)?  No  Alcohol Current alcohol use: social drinker  Caffeine Current caffeine use: denies use  Exercise Current exercise: walking  Nutrition/Diet Current diet: in general, a "healthy" diet    Cardiac risk factors: advanced age (older than 65 for men, 24 for women), diabetes mellitus, dyslipidemia, hypertension, male gender and sedentary lifestyle.  Depression Screen (Note: if answer to either of the following is "Yes", a more complete depression screening is indicated)   Q1: Over the past two weeks, have you felt down, depressed or hopeless? No  Q2: Over the past two weeks, have you felt little interest or pleasure in doing things? No  Have you lost interest or pleasure in daily life? No  Do you often feel hopeless? No  Do you cry easily over simple problems? No  Activities of Daily Living In your present state of health, do you have any difficulty performing the following activities?:  Driving? No Managing money?  No Feeding yourself? No Getting from bed to chair? No Climbing a flight of stairs? No Preparing food and eating?: No Bathing or showering? No Getting dressed: No Getting to the toilet? No Using the toilet:No Moving around from place to place: No In the past year have you fallen or had a near fall?:No   Are you sexually active?  No  Do you have more than one partner?  No  Vision Difficulties: No  Hearing Difficulties: No Do you often ask people to speak up or repeat themselves? No Do you experience ringing or noises in your ears? No Do you have difficulty understanding soft or whispered voices? No  Cognition  Do you feel that you have a problem with memory?No  Do you often misplace items? No  Do you feel safe at home?  Yes  Advanced directives Does patient have a [place]? Yes Does patient have a Living  Will? Yes   Objective:     BP 122/66, pulse 56, temp 97.9 F , resp 16, ht 5' 11.75" , wt 162 lb 6.4 oz (73.664 kg). BMI  22.19   General appearance: alert, no distress, WD/WN, male Cognitive Testing  Alert? Yes  Normal Appearance? Yes  Oriented to person? Yes  Place? Yes   Time? Yes  Recall of three objects?  Yes  Can perform simple calculations? Yes  Displays appropriate judgment? Yes  Can read the correct time from a watch/clock? Yes  HEENT: normocephalic, sclerae anicteric, TMs pearly, nares patent, no discharge or erythema, pharynx normal Oral cavity: MMM, no lesions Neck: supple, no lymphadenopathy, no thyromegaly, no masses Heart: RRR, normal S1, S2, no murmurs Lungs: CTA bilaterally, no wheezes, rhonchi, or rales Abdomen: +bs, soft, non tender, non distended, no masses, no hepatomegaly, no splenomegaly Musculoskeletal: nontender, no  swelling, no obvious deformity Rectal: DRE Nl for age w/o nodules & hemoccult - Negative. Extremities: no edema, no cyanosis, no clubbing Pulses: 2+ symmetric, upper and lower extremities, normal cap refill Neurological: alert, oriented x 3, CN2-12 intact, strength normal upper extremities and lower extremities, sensation normal throughout, DTRs 2+ throughout, no cerebellar signs, gait normal Psychiatric: normal affect, behavior normal, pleasant   Medicare Attestation I have personally reviewed: The patient's medical and social history Their use of alcohol, tobacco or illicit drugs Their current medications and supplements The patient's functional ability including ADLs,fall risks, home safety risks, cognitive, and hearing and visual impairment Diet and physical activities Evidence for depression or mood disorders  The patient's weight, height, BMI, and visual acuity have been recorded in the chart.  I have made referrals, counseling, and provided education to the patient based on review of the above and I have provided the patient with a  written personalized care plan for preventive services.    Kyron Schlitt DAVID, MD   08/27/2014

## 2014-08-27 NOTE — Patient Instructions (Signed)
Recommend the book "The END of DIETING" by Dr Baker Janus   and the book "The END of DIABETES " by Dr Excell Seltzer  At [email or web address] - get book & Audio CD's      Being diabetic has a  300% increased risk for heart attack, stroke, cancer, and alzheimer- type vascular dementia. It is very important that you work harder with diet by avoiding all foods that are white except chicken & fish. Avoid white rice (brown & wild rice is OK), white potatoes (sweetpotatoes in moderation is OK), White bread or wheat bread or anything made out of white flour like bagels, donuts, rolls, buns, biscuits, cakes, pastries, cookies, pizza crust, and pasta (made from white flour & egg whites) - vegetarian pasta or spinach or wheat pasta is OK. Multigrain breads like Arnold's or [employer], or multigrain sandwich thins or flatbreads.  Diet, exercise and weight loss can reverse and cure diabetes in the early stages.  Diet, exercise and weight loss is very important in the control and prevention of complications of diabetes which affects every system in your body, ie. Brain - dementia/stroke, eyes - glaucoma/blindness, heart - heart attack/heart failure, kidneys - dialysis, stomach - gastric paralysis, intestines - malabsorption, nerves - severe painful neuritis, circulation - gangrene & loss of a leg(s), and finally cancer and Alzheimers.    I recommend avoid fried & greasy foods,  sweets/candy, white rice (brown or wild rice or Quinoa is OK), white potatoes (sweet potatoes are OK) - anything made from white flour - bagels, doughnuts, rolls, buns, biscuits,white and wheat breads, pizza crust and traditional pasta made of white flour & egg white(vegetarian pasta or spinach or wheat pasta is OK).  Multi-grain bread is OK - like multi-grain flat bread or sandwich thins. Avoid alcohol in excess. Exercise is also important.    Eat all the vegetables you want - avoid meat, especially red meat and dairy - especially cheese.  Cheese  is the most concentrated form of trans-fats which is the worst thing to clog up our arteries. Veggie cheese is OK which can be found in the fresh produce section at [employer] or [employer] or [employer]  Preventive Care for Adults A healthy lifestyle and preventive care can promote health and wellness. Preventive health guidelines for men include the following key practices:  A routine yearly physical is a good way to check with your health care provider about your health and preventative screening. It is a chance to share any concerns and updates on your health and to receive a thorough exam.  Visit your dentist for a routine exam and preventative care every 6 months. Brush your teeth twice a day and floss once a day. Good oral hygiene prevents tooth decay and gum disease.  The frequency of eye exams is based on your age, health, family medical history, use of contact lenses, and other factors. Follow your health care provider's recommendations for frequency of eye exams.  Eat a healthy diet. Foods such as vegetables, fruits, whole grains, low-fat dairy products, and lean protein foods contain the nutrients you need without too many calories. Decrease your intake of foods high in solid fats, added sugars, and salt. Eat the right amount of calories for you.Get information about a proper diet from your health care provider, if necessary.  Regular physical exercise is one of the most important things you can do for your health. Most adults should get at least 150 minutes of moderate-intensity exercise (any activity that  increases your heart rate and causes you to sweat) each week. In addition, most adults need muscle-strengthening exercises on 2 or more days a week.  Maintain a healthy weight. The body mass index (BMI) is a screening tool to identify possible weight problems. It provides an estimate of body fat based on height and weight. Your health care provider can find your BMI and can help you  achieve or maintain a healthy weight.For adults 20 years and older:   A BMI of 18.5 to 24.9 is normal.  A BMI of 25 to 29.9 is considered overweight.  A BMI of 30 and above is considered obese.  Maintain normal blood lipids and cholesterol levels by exercising and minimizing your intake of saturated fat. Eat a balanced diet with plenty of fruit and vegetables. Blood tests for lipids and cholesterol should begin at age 65 and be repeated every 5 years. If your lipid or cholesterol levels are high, you are over 50, or you are at high risk for heart disease, you may need your cholesterol levels checked more frequently.Ongoing high lipid and cholesterol levels should be treated with medicines if diet and exercise are not working.  If you smoke, find out from your health care provider how to quit. If you do not use tobacco, do not start.  Lung cancer screening is recommended for adults aged 15-80 years who are at high risk for developing lung cancer because of a history of smoking. A yearly low-dose CT scan of the lungs is recommended for people who have at least a 30-pack-year history of smoking and are a current smoker or have quit within the past 15 years. A pack year of smoking is smoking an average of 1 pack of cigarettes a day for 1 year (for example: 1 pack a day for 30 years or 2 packs a day for 15 years). Yearly screening should continue until the smoker has stopped smoking for at least 15 years. Yearly screening should be stopped for people who develop a health problem that would prevent them from having lung cancer treatment.  If you choose to drink alcohol, do not have more than 2 drinks per day. One drink is considered to be 12 ounces (355 mL) of beer, 5 ounces (148 mL) of wine, or 1.5 ounces (44 mL) of liquor.  Avoid use of street drugs. Do not share needles with anyone. Ask for help if you need support or instructions about stopping the use of drugs.  High blood pressure causes heart  disease and increases the risk of stroke. Your blood pressure should be checked at least every 1-2 years. Ongoing high blood pressure should be treated with medicines, if weight loss and exercise are not effective.  If you are 66-76 years old, ask your health care provider if you should take aspirin to prevent heart disease.  Diabetes screening involves taking a blood sample to check your fasting blood sugar level. Testing should be considered at a younger age or be carried out more frequently if you are overweight and have at least 1 risk factor for diabetes.  Colorectal cancer can be detected and often prevented. Most routine colorectal cancer screening begins at the age of 92 and continues through age 50. However, your health care provider may recommend screening at an earlier age if you have risk factors for colon cancer. On a yearly basis, your health care provider may provide home test kits to check for hidden blood in the stool. Use of a small camera  at the end of a tube to directly examine the colon (sigmoidoscopy or colonoscopy) can detect the earliest forms of colorectal cancer. Talk to your health care provider about this at age 31, when routine screening begins. Direct exam of the colon should be repeated every 5-10 years through age 80, unless early forms of precancerous polyps or small growths are found.  Hepatitis C blood testing is recommended for all people born from [birth year] through 1965 and any individual with known risks for hepatitis C.  Screening for abdominal aortic aneurysm (AAA)  by ultrasound is recommended for men who have history of high blood pressure or who are current or former smokers.  Healthy men should  receive prostate-specific antigen (PSA) blood tests as part of routine cancer screening. Talk with your health care provider about prostate cancer screening.  Testicular cancer screening is  recommended for adult males. Screening includes self-exam, a health care provider  exam, and other screening tests. Consult with your health care provider about any symptoms you have or any concerns you have about testicular cancer.  Use sunscreen. Apply sunscreen liberally and repeatedly throughout the day. You should seek shade when your shadow is shorter than you. Protect yourself by wearing long sleeves, pants, a wide-brimmed hat, and sunglasses year round, whenever you are outdoors.  Once a month, do a whole-body skin exam, using a mirror to look at the skin on your back. Tell your health care provider about new moles, moles that have irregular borders, moles that are larger than a pencil eraser, or moles that have changed in shape or color.  Stay current with required vaccines (immunizations).  Influenza vaccine. All adults should be immunized every year.  Tetanus, diphtheria, and acellular pertussis (Td, Tdap) vaccine. An adult who has not previously received Tdap or who does not know his vaccine status should receive 1 dose of Tdap. This initial dose should be followed by tetanus and diphtheria toxoids (Td) booster doses every 10 years. Adults with an unknown or incomplete history of completing a 3-dose immunization series with Td-containing vaccines should begin or complete a primary immunization series including a Tdap dose. Adults should receive a Td booster every 10 years.  Zoster vaccine. One dose is recommended for adults aged 26 years or older unless certain conditions are present.    PREVNAR - Pneumococcal 13-valent conjugate (PCV13) vaccine. When indicated, a person who is uncertain of his immunization history and has no record of immunization should receive the PCV13 vaccine. An adult aged 73 years or older who has certain medical conditions and has not been previously immunized should receive 1 dose of PCV13 vaccine. This PCV13 should be followed with a dose of pneumococcal polysaccharide (PPSV23) vaccine. The PPSV23 vaccine dose should be obtained at least 8  weeks after the dose of PCV13 vaccine. An adult aged 49 years or older who has certain medical conditions and previously received 1 or more doses of PPSV23 vaccine should receive 1 dose of PCV13. The PCV13 vaccine dose should be obtained 1 or more years after the last PPSV23 vaccine dose.    PNEUMOVAX - Pneumococcal polysaccharide (PPSV23) vaccine. When PCV13 is also indicated, PCV13 should be obtained first. All adults aged 36 years and older should be immunized. An adult younger than age 20 years who has certain medical conditions should be immunized. Any person who resides in a nursing home or long-term care facility should be immunized. An adult smoker should be immunized. People with an immunocompromised condition and certain other  conditions should receive both PCV13 and PPSV23 vaccines. People with human immunodeficiency virus (HIV) infection should be immunized as soon as possible after diagnosis. Immunization during chemotherapy or radiation therapy should be avoided. Routine use of PPSV23 vaccine is not recommended for American Indians, [place] Natives, or people younger than 65 years unless there are medical conditions that require PPSV23 vaccine. When indicated, people who have unknown immunization and have no record of immunization should receive PPSV23 vaccine. One-time revaccination 5 years after the first dose of PPSV23 is recommended for people aged 19-64 years who have chronic kidney failure, nephrotic syndrome, asplenia, or immunocompromised conditions. People who received 1-2 doses of PPSV23 before age 90 years should receive another dose of PPSV23 vaccine at age 88 years or later if at least 5 years have passed since the previous dose. Doses of PPSV23 are not needed for people immunized with PPSV23 at or after age 1 years.    Hepatitis A vaccine. Adults who wish to be protected from this disease, have certain high-risk conditions, work with hepatitis A-infected animals, work in hepatitis  A research labs, or travel to or work in countries with a high rate of hepatitis A should be immunized. Adults who were previously unvaccinated and who anticipate close contact with an international adoptee during the first 60 days after arrival in the [place] from a country with a high rate of hepatitis A should be immunized.    Hepatitis B vaccine. Adults should be immunized if they wish to be protected from this disease, have certain high-risk conditions, may be exposed to blood or other infectious body fluids, are household contacts or sex partners of hepatitis B positive people, are clients or workers in certain care facilities, or travel to or work in countries with a high rate of hepatitis B.   Preventive Service / Frequency   Ages 56 and over  Blood pressure check.  Lipid and cholesterol check.  Lung cancer screening. / Every year if you are aged 25-80 years and have a 30-pack-year history of smoking and currently smoke or have quit within the past 15 years. Yearly screening is stopped once you have quit smoking for at least 15 years or develop a health problem that would prevent you from having lung cancer treatment.  Fecal occult blood test (FOBT) of stool. You may not have to do this test if you get a colonoscopy every 10 years.  Flexible sigmoidoscopy** or colonoscopy.** / Every 5 years for a flexible sigmoidoscopy or every 10 years for a colonoscopy beginning at age 39 and continuing until age 79.  Hepatitis C blood test.** / For all people born from [birth year] through 1963 and any individual with known risks for hepatitis C.  Abdominal aortic aneurysm (AAA) screening./ Screening current or former smokers or have Hypertension.  Skin self-exam. / Monthly.  Influenza vaccine. / Every year.  Tetanus, diphtheria, and acellular pertussis (Tdap/Td) vaccine.** / 1 dose of Td every 10 years.   Zoster vaccine.** / 1 dose for adults aged 26 years or older.         Pneumococcal  13-valent conjugate (PCV13) vaccine.    Pneumococcal polysaccharide (PPSV23) vaccine.     Hepatitis A vaccine.** / Consult your health care provider.  Hepatitis B vaccine.** / Consult your health care provider.

## 2014-08-28 ENCOUNTER — Other Ambulatory Visit: Payer: Self-pay | Admitting: *Deleted

## 2014-08-28 LAB — HEPATIC FUNCTION PANEL
ALBUMIN: 3.7 g/dL (ref 3.5–5.2)
ALK PHOS: 50 U/L (ref 39–117)
ALT: 17 U/L (ref 0–53)
AST: 14 U/L (ref 0–37)
BILIRUBIN INDIRECT: 0.5 mg/dL (ref 0.2–1.2)
BILIRUBIN TOTAL: 0.7 mg/dL (ref 0.2–1.2)
Bilirubin, Direct: 0.2 mg/dL (ref 0.0–0.3)
Total Protein: 6.5 g/dL (ref 6.0–8.3)

## 2014-08-28 LAB — URINALYSIS, MICROSCOPIC ONLY
CRYSTALS: NONE SEEN
Casts: NONE SEEN
WBC, UA: >50 WBC/hpf — AB (ref <3)

## 2014-08-28 LAB — BASIC METABOLIC PANEL WITH GFR
BUN: 15 mg/dL (ref 6–23)
CO2: 27 meq/L (ref 19–32)
CREATININE: 1.02 mg/dL (ref 0.50–1.35)
Calcium: 8.9 mg/dL (ref 8.4–10.5)
Chloride: 106 mEq/L (ref 96–112)
GFR, Est African American: 82 mL/min
GFR, Est Non African American: 71 mL/min
Glucose, Bld: 87 mg/dL (ref 70–99)
POTASSIUM: 4.5 meq/L (ref 3.5–5.3)
Sodium: 142 mEq/L (ref 135–145)

## 2014-08-28 LAB — LIPID PANEL
Cholesterol: 126 mg/dL (ref 0–200)
HDL: 56 mg/dL (ref >39)
LDL CALC: 58 mg/dL (ref 0–99)
Total CHOL/HDL Ratio: 2.3 Ratio
Triglycerides: 62 mg/dL (ref <150)
VLDL: 12 mg/dL (ref 0–40)

## 2014-08-28 LAB — MICROALBUMIN / CREATININE URINE RATIO
Creatinine, Urine: 145.3 mg/dL
Microalb Creat Ratio: 10.3 mg/g (ref 0.0–30.0)
Microalb, Ur: 1.5 mg/dL (ref <2.0)

## 2014-08-28 LAB — VITAMIN D 25 HYDROXY (VIT D DEFICIENCY, FRACTURES): Vit D, 25-Hydroxy: 71 ng/mL (ref 30–89)

## 2014-08-28 LAB — TSH: TSH: 2.154 u[IU]/mL (ref 0.350–4.500)

## 2014-08-28 LAB — INSULIN, FASTING: Insulin fasting, serum: 5 u[IU]/mL (ref 2.0–19.6)

## 2014-08-28 LAB — PSA: PSA: 0.67 ng/mL (ref <=4.00)

## 2014-08-28 LAB — MAGNESIUM: Magnesium: 1.8 mg/dL (ref 1.5–2.5)

## 2014-08-28 LAB — HEMOGLOBIN A1C
Hgb A1c MFr Bld: 5.9 % — ABNORMAL HIGH (ref <5.7)
Mean Plasma Glucose: 123 mg/dL — ABNORMAL HIGH (ref <117)

## 2014-08-28 MED ORDER — CLOPIDOGREL BISULFATE 75 MG PO TABS: 75.0000 mg | ORAL_TABLET | Freq: Every day | ORAL | Status: DC

## 2014-09-02 ENCOUNTER — Ambulatory Visit: Payer: PPO | Admitting: *Deleted

## 2014-09-02 DIAGNOSIS — N419 Inflammatory disease of prostate, unspecified: Secondary | ICD-10-CM

## 2014-09-05 ENCOUNTER — Other Ambulatory Visit: Payer: Self-pay | Admitting: Internal Medicine

## 2014-09-05 LAB — URINE CULTURE: Colony Count: 30000

## 2014-09-05 MED ORDER — CEPHALEXIN 500 MG PO CAPS: 500.0000 mg | ORAL_CAPSULE | Freq: Four times a day (QID) | ORAL | Status: AC

## 2014-09-11 ENCOUNTER — Other Ambulatory Visit (INDEPENDENT_AMBULATORY_CARE_PROVIDER_SITE_OTHER): Payer: Medicare Other | Admitting: *Deleted

## 2014-09-11 DIAGNOSIS — Z1212 Encounter for screening for malignant neoplasm of rectum: Secondary | ICD-10-CM

## 2014-09-11 LAB — POC HEMOCCULT BLD/STL (HOME/3-CARD/SCREEN)
Card #3 Fecal Occult Blood, POC: NEGATIVE
FECAL OCCULT BLD: NEGATIVE
Fecal Occult Blood, POC: NEGATIVE

## 2014-09-23 ENCOUNTER — Ambulatory Visit (INDEPENDENT_AMBULATORY_CARE_PROVIDER_SITE_OTHER): Payer: Medicare Other | Admitting: *Deleted

## 2014-09-23 DIAGNOSIS — N390 Urinary tract infection, site not specified: Secondary | ICD-10-CM

## 2014-09-23 NOTE — Progress Notes (Signed)
Patient ID: John [name], male   DOB: Feb 19, 1935, 79 y.o.   MRN: [id number] Patient presents for 2 week recheck UA, C&S.  Patient completed Keflex abx AD.

## 2014-09-24 ENCOUNTER — Ambulatory Visit: Payer: Self-pay

## 2014-09-24 LAB — URINALYSIS, ROUTINE W REFLEX MICROSCOPIC
Bilirubin Urine: NEGATIVE
GLUCOSE, UA: 100 mg/dL — AB
HGB URINE DIPSTICK: NEGATIVE
Ketones, ur: NEGATIVE mg/dL
Nitrite: NEGATIVE
PROTEIN: NEGATIVE mg/dL
Specific Gravity, Urine: 1.018 (ref 1.005–1.030)
Urobilinogen, UA: 0.2 mg/dL (ref 0.0–1.0)
pH: 5.5 (ref 5.0–8.0)

## 2014-09-24 LAB — URINALYSIS, MICROSCOPIC ONLY
BACTERIA UA: NONE SEEN
Casts: NONE SEEN
Crystals: NONE SEEN
SQUAMOUS EPITHELIAL / LPF: NONE SEEN

## 2014-09-24 LAB — URINE CULTURE
Colony Count: NO GROWTH
Organism ID, Bacteria: NO GROWTH

## 2014-10-19 ENCOUNTER — Encounter: Payer: Self-pay | Admitting: *Deleted

## 2014-11-18 ENCOUNTER — Other Ambulatory Visit: Payer: Self-pay | Admitting: Internal Medicine

## 2014-11-27 ENCOUNTER — Other Ambulatory Visit: Payer: Self-pay | Admitting: Internal Medicine

## 2014-12-03 ENCOUNTER — Ambulatory Visit: Payer: Self-pay | Admitting: Physician Assistant

## 2014-12-09 ENCOUNTER — Ambulatory Visit: Payer: Self-pay | Admitting: Physician Assistant

## 2014-12-11 ENCOUNTER — Encounter: Payer: Self-pay | Admitting: Physician Assistant

## 2014-12-11 ENCOUNTER — Ambulatory Visit (INDEPENDENT_AMBULATORY_CARE_PROVIDER_SITE_OTHER): Payer: PPO | Admitting: Physician Assistant

## 2014-12-11 VITALS — BP 110/60 | HR 80 | Temp 98.1°F | Resp 16 | Ht 71.75 in | Wt 160.0 lb

## 2014-12-11 DIAGNOSIS — E1121 Type 2 diabetes mellitus with diabetic nephropathy: Secondary | ICD-10-CM

## 2014-12-11 DIAGNOSIS — E782 Mixed hyperlipidemia: Secondary | ICD-10-CM

## 2014-12-11 DIAGNOSIS — Z79899 Other long term (current) drug therapy: Secondary | ICD-10-CM

## 2014-12-11 DIAGNOSIS — I959 Hypotension, unspecified: Secondary | ICD-10-CM

## 2014-12-11 DIAGNOSIS — I25709 Atherosclerosis of coronary artery bypass graft(s), unspecified, with unspecified angina pectoris: Secondary | ICD-10-CM

## 2014-12-11 DIAGNOSIS — I10 Essential (primary) hypertension: Secondary | ICD-10-CM

## 2014-12-11 DIAGNOSIS — N182 Chronic kidney disease, stage 2 (mild): Secondary | ICD-10-CM

## 2014-12-11 DIAGNOSIS — E559 Vitamin D deficiency, unspecified: Secondary | ICD-10-CM

## 2014-12-11 LAB — CBC WITH DIFFERENTIAL/PLATELET
BASOS ABS: 0.1 10*3/uL (ref 0.0–0.1)
Basophils Relative: 1 % (ref 0–1)
Eosinophils Absolute: 0.2 10*3/uL (ref 0.0–0.7)
Eosinophils Relative: 4 % (ref 0–5)
HEMATOCRIT: 44.5 % (ref 39.0–52.0)
Hemoglobin: 15.1 g/dL (ref 13.0–17.0)
Lymphocytes Relative: 37 % (ref 12–46)
Lymphs Abs: 1.9 10*3/uL (ref 0.7–4.0)
MCH: 31.5 pg (ref 26.0–34.0)
MCHC: 33.9 g/dL (ref 30.0–36.0)
MCV: 92.9 fL (ref 78.0–100.0)
MONO ABS: 0.4 10*3/uL (ref 0.1–1.0)
MPV: 9.7 fL (ref 8.6–12.4)
Monocytes Relative: 8 % (ref 3–12)
NEUTROS ABS: 2.6 10*3/uL (ref 1.7–7.7)
Neutrophils Relative %: 50 % (ref 43–77)
PLATELETS: 179 10*3/uL (ref 150–400)
RBC: 4.79 MIL/uL (ref 4.22–5.81)
RDW: 14.2 % (ref 11.5–15.5)
WBC: 5.1 10*3/uL (ref 4.0–10.5)

## 2014-12-11 NOTE — Progress Notes (Signed)
Assessment and Plan:  Hypertension/hypotension: Continue medication, monitor blood pressure at home. Continue DASH diet.  Reminder to go to the ER if any CP, SOB, nausea, dizziness, severe HA, changes vision/speech, left arm numbness and tingling, and jaw pain. Cholesterol: Continue diet and exercise. Check cholesterol.  Pre-diabetes-Continue diet and exercise. Check A1C Vitamin D Def- check level and continue medications.  Weight loss- increase ensure/protein, ? Need to add medication- has had work up in the past that was negative- will monitor  Continue diet and meds as discussed. Further disposition pending results of labs.  HPI 80 y.o. male  presents for 3 month follow up with hypertension, hyperlipidemia, prediabetes and vitamin D.  His blood pressure has been controlled at home, he has occ dizziness and is on cartia XT 120 for SVT but has been off it for one month, today their BP is BP: 110/60 mmHg. Denies any palpitations/abnormal heart beats. States that he has had some dizziness with standing.   He does not workout. He denies chest pain, shortness of breath, dizziness. Normal stress test 2015.   He is on cholesterol medication. zocor 80mg  1/2 M,W,F and denies myalgias. His cholesterol is at goal. The cholesterol last visit was:   Lab Results  Component Value Date   CHOL 126 08/27/2014   HDL 56 08/27/2014   LDLCALC 58 08/27/2014   TRIG 62 08/27/2014   CHOLHDL 2.3 08/27/2014   He has been working on diet and exercise for prediabetes, and denies paresthesia of the feet, polydipsia, polyuria and visual disturbances. Last A1C in the office was:  Lab Results  Component Value Date   HGBA1C 5.9* 08/27/2014  Patient is on Vitamin D supplement.   Lab Results  Component Value Date   VD25OH 74 08/27/2014  He is not on seroquel anymore that he knows of.  He has not been trying to lose weight but has lost weight, had normal CT AB in 2012 for similar problem of weight loss. Had normal  colonoscopy 2014, due 2019.  Wt Readings from Last 6 Encounters:  12/11/14 160 lb (72.576 kg)  08/27/14 162 lb 6.4 oz (73.664 kg)  07/10/14 165 lb 2 oz (74.9 kg)  05/08/14 170 lb (77.111 kg)  04/29/14 169 lb 9.6 oz (76.93 kg)  04/02/14 175 lb (79.379 kg)       Current Medications:  Current Outpatient Prescriptions on File Prior to Visit  Medication Sig Dispense Refill  . ALPRAZolam (XANAX) 1 MG tablet Take 1 tablet (1 mg total) by mouth 3 (three) times daily as needed for anxiety. 270 tablet 1  . aspirin 81 MG tablet Take 81 mg by mouth daily.      [name] CARTIA XT 120 MG 24 hr capsule Take 120 mg by mouth daily.     . Cholecalciferol (VITAMIN D3) 2000 UNITS capsule Take 2,000 Units by mouth daily.      . clopidogrel (PLAVIX) 75 MG tablet Take 1 tablet (75 mg total) by mouth daily. 90 tablet 3  . FREESTYLE LITE test strip CHECK BLOOD SUGAR 2 TIMES A DAY 150 each PRN  . Lancets (FREESTYLE) lancets 1 each by Other route as needed for other. Use as instructed    . loperamide (IMODIUM) 2 MG capsule Take 2 mg by mouth as needed for diarrhea or loose stools.    . nitroGLYCERIN (NITROSTAT) 0.4 MG SL tablet Sig: 1 tablet under tongue every 3 to 5 minutes as needed for Angina - Please Dispense # 2 bottles of # 25  tabs 50 tablet 99  . pantoprazole (PROTONIX) 40 MG tablet Take 1 tablet (40 mg total) by mouth daily. 90 tablet 3  . potassium citrate (UROCIT-K) 10 MEQ (1080 MG) SR tablet Take 10 mEq by mouth 2 (two) times daily.    . prednisoLONE acetate (PRED FORTE) 1 % ophthalmic suspension   0  . prochlorperazine (COMPAZINE) 5 MG tablet Take 1-2 tablets TID PRN for nausea. 50 tablet 0  . promethazine (PHENERGAN) 25 MG tablet Take 1 to 2 tablets every 4 hours as needed for nausea or vomitting 100 tablet 99  . QUEtiapine (SEROQUEL) 25 MG tablet TAKE ONE TABLET BY MOUTH THREE TIMES DAILY 90 tablet 0  . simvastatin (ZOCOR) 80 MG tablet Take 40 mg by mouth at bedtime. Takes 1/2 tab=40mg     . VIGAMOX 0.5 %  ophthalmic solution   0   No current facility-administered medications on file prior to visit.   Medical History:  Past Medical History  Diagnosis Date  . TIA   . SYNCOPE   . HYPERTENSION   . HYPERLIPIDEMIA   . CAD, ARTERY BYPASS GRAFT   . SUPRAVENTRICULAR TACHYCARDIA, HX OF   . Leukodystrophy   . GASTROESOPHAGEAL REFLUX DISEASE   . ASTHMA   . Diverticulosis   . Hiatal hernia   . Insomnia   . Anxiety   . Blood transfusion     "related to OHS, ulcers"  . Ulcer   . Colon polyps     adenomatous polyps  . COPD   . Sleep apnea     "don't always wear it" (03/20/2014)  . TIA (transient ischemic attack) ~ 2010  . Arthritis     "lower back; left ankle" (03/20/2014)  . DIABETES MELLITUS, TYPE II   . Nephrolithiasis     "I've had over 320 kidney stones" (03/20/2014)  . Dementia     "I'm in the early stages of dementia" (03/20/2014)   Allergies:  Allergies  Allergen Reactions  . Beta Adrenergic Blockers     REACTION: Bradycardia  . Gabapentin     Severe confusion  . Prozac [Fluoxetine Hcl]   . Soma [Carisoprodol]      Review of Systems:  Review of Systems  Constitutional: Positive for weight loss. Negative for fever, chills, malaise/fatigue and diaphoresis.  HENT: Negative.   Respiratory: Negative.   Cardiovascular: Negative.   Gastrointestinal: Positive for diarrhea. Negative for heartburn, nausea, vomiting, abdominal pain, constipation, blood in stool and melena.       Decreased appetite  Genitourinary: Negative.   Musculoskeletal: Negative.   Skin: Negative.   Neurological: Positive for dizziness. Negative for tingling, tremors, sensory change, speech change, focal weakness, seizures, loss of consciousness and weakness.  Psychiatric/Behavioral: Negative.     Family history- Review and unchanged Social history- Review and unchanged Physical Exam: BP 110/60 mmHg  Pulse 80  Temp(Src) 98.1 F (36.7 C)  Resp 16  Ht 5' 11.75" (1.822 m)  Wt 160 lb (72.576 kg)  BMI  21.86 kg/m2 Wt Readings from Last 3 Encounters:  12/11/14 160 lb (72.576 kg)  08/27/14 162 lb 6.4 oz (73.664 kg)  07/10/14 165 lb 2 oz (74.9 kg)   General Appearance: Well nourished, in no apparent distress. Eyes: PERRLA, EOMs, conjunctiva no swelling or erythema Sinuses: No Frontal/maxillary tenderness ENT/Mouth: Ext aud canals clear, TMs without erythema, bulging. No erythema, swelling, or exudate on post pharynx.  Tonsils not swollen or erythematous. Hearing normal.  Neck: Supple, thyroid normal.  Respiratory: Respiratory effort normal, BS  equal bilaterally without rales, rhonchi, wheezing or stridor.  Cardio: RRR with no MRGs. Brisk peripheral pulses without edema.  Abdomen: Soft, + BS,  Non tender, no guarding, rebound, hernias, masses. Lymphatics: Non tender without lymphadenopathy.  Musculoskeletal: Full ROM, 5/5 strength, Normal gait Skin: Warm, dry without rashes, lesions, ecchymosis.  Neuro: Cranial nerves intact. Normal muscle tone, no cerebellar symptoms. Psych: Awake and oriented X 3, normal affect, Insight and Judgment appropriate.    Vicie Mutters, PA-C 1:56 PM [facility]

## 2014-12-11 NOTE — Patient Instructions (Signed)
High Protein Diet  A high protein diet means that high protein foods are added to your diet. Getting more protein in the diet is important for a number of reasons. Protein helps the body to build tissue, muscle, and to repair damage. People who have had surgery, injuries such as broken bones, infections, and burns, or illnesses such as cancer, may need more protein in their diet.   SERVING SIZES  Measuring foods and serving sizes helps to make sure you are getting the right amount of food. The list below tells how big or small some common serving sizes are.   · 1 oz.........4 stacked dice.  · 3 oz.........Deck of cards.  · 1 tsp........Tip of little finger.  · 1 tbs........Thumb.  · 2 tbs........Golf ball.  · ½ cup.......Half of a fist.  · 1 cup........A fist.  FOOD SOURCES OF PROTEIN  Listed below are some food sources of protein and the amount of protein they contain. Your Registered Dietitian can calculate how many grams of protein you need for your medical condition. High protein foods can be added to the diet at mealtime or as snacks. Be sure to have at least 1 protein-containing food at each meal and snack to ensure adequate intake.   Meats and Meat Substitutes / Protein (g)  · 3 oz poultry (chicken, turkey) / 26 g  · 3 oz tuna, canned in water / 26 g  · 3 oz fish (cod) / 21 g  · 3 oz red meat (beef, pork) / 21 g  · 4 oz tofu / 9 g  · 1 egg / 6 g  · ¼ cup egg substitute / 5 g  · 1 cup dried beans / 15 g  · 1 cup soy milk / 4 g  Dairy / Protein (g)  · 1 cup milk (skim, 1%, 2%, whole) / 8 g  · ½ cup evaporated milk / 9 g  · 1 cup buttermilk / 8 g  · 1 cup low-fat plain yogurt / 11 g  · 1 cup regular plain yogurt / 9 g  · ½ cup cottage cheese / 14 g  · 1 oz cheddar cheese / 7 g  Nuts / Protein (g)  · 2 tbs peanut butter / 8 g  · 1 oz peanuts / 7 g  · 2 tbs cashews / 5 g  · 2 tbs almonds / 5 g  Document Released: 10/11/2005 Document Revised: 01/03/2012 Document Reviewed: 07/14/2007  [employer]® Patient Information  ©2015 [employer]. This information is not intended to replace advice given to you by your health care provider. Make sure you discuss any questions you have with your health care provider.

## 2014-12-12 LAB — BASIC METABOLIC PANEL WITH GFR
BUN: 15 mg/dL (ref 6–23)
CALCIUM: 9.1 mg/dL (ref 8.4–10.5)
CHLORIDE: 104 meq/L (ref 96–112)
CO2: 27 mEq/L (ref 19–32)
Creat: 1.21 mg/dL (ref 0.50–1.35)
GFR, Est African American: 66 mL/min
GFR, Est Non African American: 57 mL/min — ABNORMAL LOW
Glucose, Bld: 76 mg/dL (ref 70–99)
Potassium: 4.4 mEq/L (ref 3.5–5.3)
SODIUM: 139 meq/L (ref 135–145)

## 2014-12-12 LAB — HEPATIC FUNCTION PANEL
ALK PHOS: 46 U/L (ref 39–117)
ALT: 22 U/L (ref 0–53)
AST: 17 U/L (ref 0–37)
Albumin: 3.7 g/dL (ref 3.5–5.2)
BILIRUBIN DIRECT: 0.2 mg/dL (ref 0.0–0.3)
BILIRUBIN INDIRECT: 0.5 mg/dL (ref 0.2–1.2)
BILIRUBIN TOTAL: 0.7 mg/dL (ref 0.2–1.2)
Total Protein: 6.2 g/dL (ref 6.0–8.3)

## 2014-12-12 LAB — LIPID PANEL
Cholesterol: 134 mg/dL (ref 0–200)
HDL: 53 mg/dL (ref >39)
LDL CALC: 68 mg/dL (ref 0–99)
TRIGLYCERIDES: 63 mg/dL (ref <150)
Total CHOL/HDL Ratio: 2.5 Ratio
VLDL: 13 mg/dL (ref 0–40)

## 2014-12-12 LAB — MAGNESIUM: Magnesium: 1.6 mg/dL (ref 1.5–2.5)

## 2014-12-12 LAB — HEMOGLOBIN A1C
Hgb A1c MFr Bld: 5.5 % (ref <5.7)
Mean Plasma Glucose: 111 mg/dL (ref <117)

## 2014-12-12 LAB — TSH: TSH: 2.114 u[IU]/mL (ref 0.350–4.500)

## 2014-12-12 LAB — VITAMIN D 25 HYDROXY (VIT D DEFICIENCY, FRACTURES): Vit D, 25-Hydroxy: 43 ng/mL (ref 30–100)

## 2014-12-30 ENCOUNTER — Other Ambulatory Visit: Payer: Self-pay | Admitting: Internal Medicine

## 2014-12-30 DIAGNOSIS — F411 Generalized anxiety disorder: Secondary | ICD-10-CM

## 2015-01-10 ENCOUNTER — Other Ambulatory Visit (HOSPITAL_COMMUNITY): Payer: Self-pay | Admitting: *Deleted

## 2015-01-10 DIAGNOSIS — I6523 Occlusion and stenosis of bilateral carotid arteries: Secondary | ICD-10-CM

## 2015-01-23 NOTE — Progress Notes (Signed)
CC: easy bruising   History of Present Illness: 80 yo male with history of CAD s/p 2 V CABG 1996 , DM, HLD, TIA here today for cardiac follow up. He has been followed in the past by Dr. Lia Foyer. Last cath 2002 with patent LIMA to LAD. He is on Plavix for stroke prevention per primary care. Echo November 2014 with normal LV systolic function, mild AI. Carotid dopplers November 2014 with mild bilateral disease. He was last seen in my office early in May 2015 and was doing well with some fatigue. Stress myoview May 2015 with no ischemia. Admitted to [facility] May 2015 with syncope/diarrhea. Found to have C. Diff and was treated.   He has been doing well. No chest pain or SOB. No near syncope or syncope. He is having some balance issues.   Primary Care Physician: Melford Aase  Last Lipid Profile: Followed in primary care.    Past Medical History  Diagnosis Date  . TIA   . SYNCOPE   . HYPERTENSION   . HYPERLIPIDEMIA   . CAD, ARTERY BYPASS GRAFT   . SUPRAVENTRICULAR TACHYCARDIA, HX OF   . Leukodystrophy   . GASTROESOPHAGEAL REFLUX DISEASE   . ASTHMA   . Diverticulosis   . Hiatal hernia   . Insomnia   . Anxiety   . Blood transfusion     "related to OHS, ulcers"  . Ulcer   . Colon polyps     adenomatous polyps  . COPD   . Sleep apnea     "don't always wear it" (03/20/2014)  . TIA (transient ischemic attack) ~ 2010  . Arthritis     "lower back; left ankle" (03/20/2014)  . DIABETES MELLITUS, TYPE II   . Nephrolithiasis     "I've had over 320 kidney stones" (03/20/2014)  . Dementia     "I'm in the early stages of dementia" (03/20/2014)    Past Surgical History  Procedure Laterality Date  . Cardiac catheterization    . Gastrectomy    . Vagotomy    . Cystourethroscopy with stent removal.    . Irrigation and debridement sebaceous cyst      "off my back  . Colonoscopy    . Upper gastrointestinal endoscopy    . Excisional hemorrhoidectomy    . Polypectomy    . Coronary artery bypass  graft  1996    CABG X2  . Prostate surgery      "took the center of my prostate out"  . Cystoscopy with ureteroscopy, stone basketry and stent placement    . Ankle fracture surgery Left ~ 2012  . Hiatal hernia repair      Current Outpatient Prescriptions  Medication Sig Dispense Refill  . ALPRAZolam (XANAX) 1 MG tablet TAKE ONE TABLET BY MOUTH THREE TIMES DAILY FOR ANXIETY 270 tablet 1  . aspirin 81 MG tablet Take 81 mg by mouth daily.      . Cholecalciferol (VITAMIN D3) 2000 UNITS capsule Take 2,000 Units by mouth daily.      . clopidogrel (PLAVIX) 75 MG tablet Take 1 tablet (75 mg total) by mouth daily. 90 tablet 3  . FREESTYLE LITE test strip CHECK BLOOD SUGAR 2 TIMES A DAY 150 each PRN  . Lancets (FREESTYLE) lancets 1 each by Other route as needed for other. Use as instructed    . loperamide (IMODIUM) 2 MG capsule Take 2 mg by mouth as needed for diarrhea or loose stools.    . nitroGLYCERIN (NITROSTAT) 0.4 MG  SL tablet Sig: 1 tablet under tongue every 3 to 5 minutes as needed for Angina - Please Dispense # 2 bottles of # 25 tabs 50 tablet 99  . pantoprazole (PROTONIX) 40 MG tablet Take 1 tablet (40 mg total) by mouth daily. 90 tablet 3  . potassium citrate (UROCIT-K) 10 MEQ (1080 MG) SR tablet Take 10 mEq by mouth 2 (two) times daily.    . prochlorperazine (COMPAZINE) 5 MG tablet Take 1-2 tablets TID PRN for nausea. 50 tablet 0  . promethazine (PHENERGAN) 25 MG tablet Take 1 to 2 tablets every 4 hours as needed for nausea or vomitting 100 tablet 99  . QUEtiapine (SEROQUEL) 25 MG tablet TAKE ONE TABLET BY MOUTH THREE TIMES DAILY 90 tablet 0  . simvastatin (ZOCOR) 80 MG tablet Take 40 mg by mouth at bedtime. Takes 1/2 tab=40mg     . VIGAMOX 0.5 % ophthalmic solution   0   No current facility-administered medications for this visit.    Allergies  Allergen Reactions  . Beta Adrenergic Blockers Other (See Comments)    REACTION: Bradycardia  . Gabapentin Other (See Comments)    Severe  confusion  . Prozac [Fluoxetine Hcl] Other (See Comments)    Patient state that it does not agree with him  . Soma [Carisoprodol] Other (See Comments)    Patient stated that it does not agree with him    History   Social History  . Marital Status: Married    Spouse Name: N/A  . Number of Children: 2  . Years of Education: N/A   Occupational History  . retired-maintenance dept at [employer]  . Smoking status: Former Smoker -- 0.50 packs/day    Types: Cigarettes    Quit date: 10/25/1962  . Smokeless tobacco: Former Systems developer    Types: Chew  . Alcohol Use: No  . Drug Use: No  . Sexual Activity: Yes   Other Topics Concern  . Not on file   Social History Narrative    Family History  Problem Relation Age of Onset  . Heart disease Brother   . Diabetes Brother   . Alcohol abuse Father   . Heart disease Paternal Grandmother   . Colon cancer Neg Hx   . Stomach cancer Neg Hx     Review of Systems:  As stated in the HPI and otherwise negative.   BP 106/64 mmHg  Pulse 69  Ht 5\' 11"  (1.803 m)  Wt 154 lb (69.854 kg)  BMI 21.49 kg/m2  SpO2 98%  Physical Examination: General: Well developed, well nourished, NAD HEENT: OP clear, mucus membranes moist SKIN: warm, dry. No rashes. Neuro: No focal deficits Musculoskeletal: Muscle strength 5/5 all ext Psychiatric: Mood and affect normal Neck: No JVD, no carotid bruits, no thyromegaly, no lymphadenopathy. Lungs:Clear bilaterally, no wheezes, rhonci, crackles Cardiovascular: Regular rate and rhythm. No murmurs, gallops or rubs. Abdomen:Soft. Bowel sounds present. Non-tender.  Extremities: No lower extremity edema. Pulses are 2 + in the bilateral DP/PT  Cardiac cath March 2002:  The left main coronary artery is free of significant  disease.  The LAD coursed to the apex. There was about 80% segmental narrowing at the  origin of the large diagonal branch. The large diagonal branch itself had an    eccentric 80% plaque.  The internal mammary to the diagonal subsequently going to the LAD was patent.  There was perhaps irregularity beyond the insertion site at the diagonal  leading into the  LAD segment but this was not focally significant. Runoff  into both diagonal and LAD distally was without significant narrowing.  The circumflex proper provided both two marginal branches and an AV  circumflex. There was minimal luminal irregularity but no high-grade  stenosis.  The right coronary artery was a dominant vessel providing posterior descending  and posterolateral branch. There is perhaps 30-40% narrowing throughout the  whole proximal and mid segment. However, high-grade focal disease was not  noted and this does not appear to be flow-limiting.  Echo 09/04/13:  Left ventricle: The cavity size was normal. Wall thickness was normal. Systolic function was normal. The estimated ejection fraction was in the range of 50% to 55%. Wall motion was normal; there were no regional wall motion abnormalities. Doppler parameters are consistent with abnormal left ventricular relaxation (grade 1 diastolic dysfunction). - Aortic valve: Mild regurgitation. - Mitral valve: There was mild systolic anterior motion of the chordal structures. - Left atrium: The atrium was mildly dilated.  Stress myoview 03/05/14: Stress Procedure: The patient received IV Lexiscan 0.4 mg over 15-seconds with concurrent low level exercise and then Technetium 2m Sestamibi was injected at 30-seconds while the patient continued walking one more minute. The patient complained of headache with Lexiscan. Quantitative spect images were obtained after a 45-minute delay. Stress ECG: Sinus rhythm with T wave inversion in lead V3 through V6, mild. QPS Raw Data Images: Mild diaphragmatic attenuation. Normal left ventricular size. Stress Images: There is decreased uptake in the inferior wall. Rest Images: There is decreased  uptake in the inferior wall. Subtraction (SDS): No evidence of ischemia. Transient Ischemic Dilatation (Normal <1.22): 1.06 Lung/Heart Ratio (Normal <0.45): 0.25 Quantitative Gated Spect Images QGS EDV: 138 ml QGS ESV: 59 ml Impression Exercise Capacity: Lexiscan with low level exercise. BP Response: Normal blood pressure response. Clinical Symptoms: Typical symptoms with Lexiscan. ECG Impression: Nondiagnostic mild T wave inversion at low level exercise. Comparison with Prior Nuclear Study: No images to compare Overall Impression: Low risk stress nuclear study With no areas of significant ischemia identified. Inferior wall decreased uptake likely secondary to diaphragmatic attenuation.. LV Ejection Fraction: 57%. LV Wall Motion: NL LV Function; NL Wall Motion  EKG:  EKG is not ordered today.  Recent Labs: 12/11/2014: ALT 22; BUN 15; Creatinine 1.21; Hemoglobin 15.1; Magnesium 1.6; Platelets 179; Potassium 4.4; Sodium 139; TSH 2.114   Lipid Panel    Component Value Date/Time   CHOL 134 12/11/2014 1410   TRIG 63 12/11/2014 1410   HDL 53 12/11/2014 1410   CHOLHDL 2.5 12/11/2014 1410   VLDL 13 12/11/2014 1410   LDLCALC 68 12/11/2014 1410     Wt Readings from Last 3 Encounters:  01/24/15 154 lb (69.854 kg)  12/11/14 160 lb (72.576 kg)  08/27/14 162 lb 6.4 oz (73.664 kg)    Other studies Reviewed: Additional studies/ records that were reviewed today include:. Review of the above records demonstrates:    Assessment and Plan:   1.CAD: Stable. He is on ASA and Plavix daily and statin. He does not tolerate beta blockers. Echo 09/04/13 with normal LV function. Stress myoview May 2015 with no ischemia.   2. HLD: On a statin. Lipids well controlled.   3. Carotid artery disease: Mild by dopplers today. Repeat 3 years.   4. PVCs: No symptoms. He stopped Cardizem and is doing well.   Current medicines are reviewed at length with the patient today.  The patient does not  have concerns regarding medicines.  The following changes  have been made:  no change  Labs/ tests ordered today include: None No orders of the defined types were placed in this encounter.    Disposition:   FU with me  in 6 months  Signed, Lauree Chandler, MD 01/24/2015 12:49 PM    [address], [place], [place]  [address] Phone: [phone]; Fax: [phone]

## 2015-01-24 ENCOUNTER — Encounter: Payer: Self-pay | Admitting: Cardiovascular Disease

## 2015-01-24 ENCOUNTER — Ambulatory Visit (HOSPITAL_COMMUNITY): Payer: PPO | Attending: Cardiology | Admitting: *Deleted

## 2015-01-24 ENCOUNTER — Ambulatory Visit (INDEPENDENT_AMBULATORY_CARE_PROVIDER_SITE_OTHER): Payer: PPO | Admitting: Cardiovascular Disease

## 2015-01-24 VITALS — BP 106/64 | HR 69 | Ht 71.0 in | Wt 154.0 lb

## 2015-01-24 DIAGNOSIS — I6523 Occlusion and stenosis of bilateral carotid arteries: Secondary | ICD-10-CM | POA: Diagnosis present

## 2015-01-24 DIAGNOSIS — I493 Ventricular premature depolarization: Secondary | ICD-10-CM

## 2015-01-24 DIAGNOSIS — I779 Disorder of arteries and arterioles, unspecified: Secondary | ICD-10-CM

## 2015-01-24 DIAGNOSIS — I2510 Atherosclerotic heart disease of native coronary artery without angina pectoris: Secondary | ICD-10-CM

## 2015-01-24 DIAGNOSIS — I739 Peripheral vascular disease, unspecified: Secondary | ICD-10-CM

## 2015-01-24 DIAGNOSIS — E785 Hyperlipidemia, unspecified: Secondary | ICD-10-CM | POA: Diagnosis not present

## 2015-01-24 NOTE — Patient Instructions (Signed)
Your physician wants you to follow-up in:  6 months. You will receive a reminder letter in the mail two months in advance. If you don't receive a letter, please call our office to schedule the follow-up appointment.   

## 2015-01-24 NOTE — Progress Notes (Signed)
Carotid Duplex Scan Performed 

## 2015-01-27 ENCOUNTER — Encounter (HOSPITAL_COMMUNITY): Payer: Self-pay

## 2015-02-05 ENCOUNTER — Other Ambulatory Visit: Payer: Self-pay | Admitting: Internal Medicine

## 2015-02-06 ENCOUNTER — Encounter: Payer: Self-pay | Admitting: Internal Medicine

## 2015-02-06 ENCOUNTER — Ambulatory Visit (INDEPENDENT_AMBULATORY_CARE_PROVIDER_SITE_OTHER): Payer: PPO | Admitting: Internal Medicine

## 2015-02-06 VITALS — BP 148/78 | HR 100 | Temp 97.3°F | Resp 18 | Ht 71.75 in | Wt 156.4 lb

## 2015-02-06 DIAGNOSIS — R55 Syncope and collapse: Secondary | ICD-10-CM

## 2015-02-06 DIAGNOSIS — Z79899 Other long term (current) drug therapy: Secondary | ICD-10-CM

## 2015-02-06 DIAGNOSIS — E162 Hypoglycemia, unspecified: Secondary | ICD-10-CM

## 2015-02-06 DIAGNOSIS — I951 Orthostatic hypotension: Secondary | ICD-10-CM | POA: Insufficient documentation

## 2015-02-06 NOTE — Progress Notes (Signed)
Subjective:    Patient ID: John [name], male    DOB: Feb 27, 1935, 81 y.o.   MRN: [id number]  HPI Patient presents today after a vasovagal episode yesterday occuring 2-3 hrs after a high glycemic meal. EMS was called to house and pt was determined to be hypoglycemic & revived with glucopaste. No reported neuro sx's tongue biting, incontinence or reported seizure activity. Patient had no residual sx's other than about a 10-15 min lapse or period of amnesia. Pt has hx/o T2DM reformed to preDM vs non diabetic by prudent diet and consequent weight loss. He has not been on diabetic meds since 2011 and he's had no other reactive hypoglycemic type episodes. He had no other CV, respiratory,GI or GU sx's. 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  Medication Sig  . ALPRAZolam (XANAX) 1 MG tablet TAKE ONE TABLET BY MOUTH THREE TIMES DAILY FOR ANXIETY  . aspirin 81 MG tablet Take 81 mg by mouth daily.    . Cholecalciferol (VITAMIN D3) 2000 UNITS capsule Take 2,000 Units by mouth daily.    . clopidogrel (PLAVIX) 75 MG tablet Take 1 tablet (75 mg total) by mouth daily.  [name] FREESTYLE LITE test strip CHECK BLOOD SUGAR 2 TIMES A DAY  . Lancets (FREESTYLE) lancets 1 each by Other route as needed for other. Use as instructed  . loperamide (IMODIUM) 2 MG capsule Take 2 mg by mouth as needed for diarrhea or loose stools.  . nitroGLYCERIN (NITROSTAT) 0.4 MG SL tablet Sig: 1 tablet under tongue every 3 to 5 minutes as needed for Angina - Please Dispense # 2 bottles of # 25 tabs  . pantoprazole (PROTONIX) 40 MG tablet Take 1 tablet (40 mg total) by mouth daily.  . potassium citrate (UROCIT-K) 10 MEQ (1080 MG) SR tablet Take 10 mEq by mouth 2 (two) times daily.  . prochlorperazine (COMPAZINE) 5 MG tablet Take 1-2 tablets TID PRN for nausea.  . promethazine (PHENERGAN) 25 MG tablet Take 1 to 2 tablets every 4 hours as needed for nausea or vomitting  . QUEtiapine  (SEROQUEL) 25 MG tablet TAKE ONE TABLET BY MOUTH THREE TIMES DAILY  . simvastatin (ZOCOR) 80 MG tablet Take 40 mg by mouth at bedtime. Takes 1/2 tab=40mg   . VIGAMOX 0.5 % ophthalmic solution    Allergies  Allergen Reactions  . Beta Adrenergic Blockers Other (See Comments)    REACTION: Bradycardia  . Gabapentin Other (See Comments)    Severe confusion  . Prozac [Fluoxetine Hcl] Other (See Comments)    Patient state that it does not agree with him  . Soma [Carisoprodol] Other (See Comments)    Patient stated that it does not agree with him   Past Medical History  Diagnosis Date  . TIA   . SYNCOPE   . HYPERTENSION   . HYPERLIPIDEMIA   . CAD, ARTERY BYPASS GRAFT   . SUPRAVENTRICULAR TACHYCARDIA, HX OF   . Leukodystrophy   . GASTROESOPHAGEAL REFLUX DISEASE   . ASTHMA   . Diverticulosis   . Hiatal hernia   . Insomnia   . Anxiety   . Blood transfusion     "related to OHS, ulcers"  . Ulcer   . Colon polyps     adenomatous polyps  . COPD   . Sleep apnea     "don't always wear it" (03/20/2014)  . TIA (transient ischemic attack) ~ 2010  . Arthritis     "lower back; left ankle" (03/20/2014)  .  DIABETES MELLITUS, TYPE II   . Nephrolithiasis     "I've had over 320 kidney stones" (03/20/2014)  . Dementia     "I'm in the early stages of dementia" (03/20/2014)   Review of Systems In addition to the HPI above,  No Fever-chills,  No Headache, No changes with Vision or hearing,  No problems swallowing food or Liquids,  No Chest pain or productive Cough or Shortness of Breath,  No Abdominal pain, No Nausea or Vomitting, Bowel movements are regular,  No Blood in stool or Urine,  No dysuria,  No new skin rashes or bruises,  No new joints pains-aches,  No new weakness, tingling, numbness in any extremity,  No recent weight loss,  No polyuria, polydypsia or polyphagia,  No significant Mental Stressors.  A full 10 point Review of Systems was done, except as stated above, all other  Review of Systems were negative    Objective:   Physical Exam   BP 148/78 mmHg  Pulse 100  Temp(Src) 97.3 F (36.3 C)  Resp 18  Ht 5' 11.75" (1.822 m)  Wt 156 lb 6.4 oz (70.943 kg)  BMI 21.37 kg/m2 Postural sitting/standing BP's were normal.   In No apparent Distress.   HEENT - Eac's patent. TM's Nl. EOM's full. PERRLA. NasoOroPharynx clear. Neck - supple. Nl Thyroid. Carotids 2+ & No bruits, nodes, JVD Chest - Clear equal BS w/o Rales, rhonchi, wheezes. Cor - Nl HS. RRR w/o sig MGR. PP 1(+). No edema. Abd - No palpable organomegaly, masses or tenderness. BS nl. MS- FROM w/o deformities. Muscle power, tone and bulk Nl. Gait Nl. Neuro - No obvious Cr N abnormalities. Sensory, motor and Cerebellar functions appear Nl w/o focal abnormalities. Psyche - Mental status normal & appropriate.  No delusions, ideations or obvious mood abnormalities.    Assessment & Plan:   1. Syncope, unspecified syncope type  - US Abdomen Complete; Future - r/o insulinoma  2. Hypoglycemia  - Insulin, random  3. Autonomic postural hypotension   4. Medication management  - CBC with Differential/Platelet - BASIC METABOLIC PANEL WITH GFR   - discussed meds/SE's  - Disposition pending diagnostic labs & scan.

## 2015-02-07 ENCOUNTER — Other Ambulatory Visit: Payer: PPO

## 2015-02-07 LAB — BASIC METABOLIC PANEL WITH GFR
BUN: 21 mg/dL (ref 6–23)
CALCIUM: 9 mg/dL (ref 8.4–10.5)
CO2: 26 mEq/L (ref 19–32)
CREATININE: 1.36 mg/dL — AB (ref 0.50–1.35)
Chloride: 106 mEq/L (ref 96–112)
GFR, EST NON AFRICAN AMERICAN: 49 mL/min — AB
GFR, Est African American: 57 mL/min — ABNORMAL LOW
Glucose, Bld: 89 mg/dL (ref 70–99)
Potassium: 4.9 mEq/L (ref 3.5–5.3)
Sodium: 141 mEq/L (ref 135–145)

## 2015-02-07 LAB — CBC WITH DIFFERENTIAL/PLATELET
Basophils Absolute: 0 10*3/uL (ref 0.0–0.1)
Basophils Relative: 0 % (ref 0–1)
EOS ABS: 0.1 10*3/uL (ref 0.0–0.7)
EOS PCT: 1 % (ref 0–5)
HCT: 44.6 % (ref 39.0–52.0)
Hemoglobin: 15.3 g/dL (ref 13.0–17.0)
LYMPHS PCT: 21 % (ref 12–46)
Lymphs Abs: 1.6 10*3/uL (ref 0.7–4.0)
MCH: 32.4 pg (ref 26.0–34.0)
MCHC: 34.3 g/dL (ref 30.0–36.0)
MCV: 94.5 fL (ref 78.0–100.0)
MPV: 9.3 fL (ref 8.6–12.4)
Monocytes Absolute: 0.7 10*3/uL (ref 0.1–1.0)
Monocytes Relative: 9 % (ref 3–12)
Neutro Abs: 5.2 10*3/uL (ref 1.7–7.7)
Neutrophils Relative %: 69 % (ref 43–77)
Platelets: 195 10*3/uL (ref 150–400)
RBC: 4.72 MIL/uL (ref 4.22–5.81)
RDW: 14.6 % (ref 11.5–15.5)
WBC: 7.6 10*3/uL (ref 4.0–10.5)

## 2015-02-07 LAB — INSULIN, RANDOM: INSULIN: 6.2 u[IU]/mL (ref 2.0–19.6)

## 2015-02-08 ENCOUNTER — Encounter: Payer: Self-pay | Admitting: *Deleted

## 2015-02-08 DIAGNOSIS — R55 Syncope and collapse: Secondary | ICD-10-CM

## 2015-02-08 DIAGNOSIS — I951 Orthostatic hypotension: Secondary | ICD-10-CM

## 2015-02-08 DIAGNOSIS — E162 Hypoglycemia, unspecified: Secondary | ICD-10-CM

## 2015-02-10 ENCOUNTER — Other Ambulatory Visit: Payer: Self-pay | Admitting: *Deleted

## 2015-02-10 MED ORDER — PANTOPRAZOLE SODIUM 40 MG PO TBEC: 40.0000 mg | DELAYED_RELEASE_TABLET | Freq: Every day | ORAL | Status: DC

## 2015-02-10 MED ORDER — POTASSIUM CITRATE ER 10 MEQ (1080 MG) PO TBCR: 10.0000 meq | EXTENDED_RELEASE_TABLET | Freq: Two times a day (BID) | ORAL | Status: DC

## 2015-02-11 ENCOUNTER — Ambulatory Visit: Payer: Self-pay | Admitting: Interventional Cardiology

## 2015-02-13 ENCOUNTER — Other Ambulatory Visit: Payer: Self-pay | Admitting: Physician Assistant

## 2015-03-04 ENCOUNTER — Encounter: Payer: Self-pay | Admitting: Internal Medicine

## 2015-03-04 ENCOUNTER — Ambulatory Visit (INDEPENDENT_AMBULATORY_CARE_PROVIDER_SITE_OTHER): Payer: PPO | Admitting: Internal Medicine

## 2015-03-04 VITALS — BP 120/64 | HR 84 | Temp 97.9°F | Resp 16 | Ht 71.75 in | Wt 154.0 lb

## 2015-03-04 DIAGNOSIS — E782 Mixed hyperlipidemia: Secondary | ICD-10-CM

## 2015-03-04 DIAGNOSIS — E1121 Type 2 diabetes mellitus with diabetic nephropathy: Secondary | ICD-10-CM

## 2015-03-04 DIAGNOSIS — H1010 Acute atopic conjunctivitis, unspecified eye: Secondary | ICD-10-CM

## 2015-03-04 DIAGNOSIS — Z79899 Other long term (current) drug therapy: Secondary | ICD-10-CM

## 2015-03-04 DIAGNOSIS — I25709 Atherosclerosis of coronary artery bypass graft(s), unspecified, with unspecified angina pectoris: Secondary | ICD-10-CM

## 2015-03-04 DIAGNOSIS — E559 Vitamin D deficiency, unspecified: Secondary | ICD-10-CM

## 2015-03-04 DIAGNOSIS — I10 Essential (primary) hypertension: Secondary | ICD-10-CM

## 2015-03-04 DIAGNOSIS — R413 Other amnesia: Secondary | ICD-10-CM

## 2015-03-04 LAB — CBC WITH DIFFERENTIAL/PLATELET
Basophils Absolute: 0 10*3/uL (ref 0.0–0.1)
Basophils Relative: 1 % (ref 0–1)
EOS ABS: 0.1 10*3/uL (ref 0.0–0.7)
Eosinophils Relative: 3 % (ref 0–5)
HCT: 44.6 % (ref 39.0–52.0)
HEMOGLOBIN: 15 g/dL (ref 13.0–17.0)
Lymphocytes Relative: 41 % (ref 12–46)
Lymphs Abs: 2 10*3/uL (ref 0.7–4.0)
MCH: 31.9 pg (ref 26.0–34.0)
MCHC: 33.6 g/dL (ref 30.0–36.0)
MCV: 94.9 fL (ref 78.0–100.0)
MONO ABS: 0.4 10*3/uL (ref 0.1–1.0)
MONOS PCT: 9 % (ref 3–12)
MPV: 9.3 fL (ref 8.6–12.4)
Neutro Abs: 2.3 10*3/uL (ref 1.7–7.7)
Neutrophils Relative %: 46 % (ref 43–77)
PLATELETS: 201 10*3/uL (ref 150–400)
RBC: 4.7 MIL/uL (ref 4.22–5.81)
RDW: 14.2 % (ref 11.5–15.5)
WBC: 4.9 10*3/uL (ref 4.0–10.5)

## 2015-03-04 LAB — BASIC METABOLIC PANEL WITH GFR
BUN: 23 mg/dL (ref 6–23)
CO2: 25 mEq/L (ref 19–32)
Calcium: 9.5 mg/dL (ref 8.4–10.5)
Chloride: 107 mEq/L (ref 96–112)
Creat: 1.3 mg/dL (ref 0.50–1.35)
GFR, EST AFRICAN AMERICAN: 60 mL/min
GFR, EST NON AFRICAN AMERICAN: 52 mL/min — AB
Glucose, Bld: 66 mg/dL — ABNORMAL LOW (ref 70–99)
POTASSIUM: 4.3 meq/L (ref 3.5–5.3)
SODIUM: 143 meq/L (ref 135–145)

## 2015-03-04 LAB — LIPID PANEL
CHOL/HDL RATIO: 2.5 ratio
Cholesterol: 144 mg/dL (ref 0–200)
HDL: 57 mg/dL (ref >=40)
LDL Cholesterol: 64 mg/dL (ref 0–99)
TRIGLYCERIDES: 114 mg/dL (ref <150)
VLDL: 23 mg/dL (ref 0–40)

## 2015-03-04 LAB — HEMOGLOBIN A1C
Hgb A1c MFr Bld: 5.5 % (ref <5.7)
Mean Plasma Glucose: 111 mg/dL (ref <117)

## 2015-03-04 LAB — HEPATIC FUNCTION PANEL
ALK PHOS: 48 U/L (ref 39–117)
ALT: 26 U/L (ref 0–53)
AST: 19 U/L (ref 0–37)
Albumin: 3.8 g/dL (ref 3.5–5.2)
BILIRUBIN TOTAL: 0.5 mg/dL (ref 0.2–1.2)
Bilirubin, Direct: 0.1 mg/dL (ref 0.0–0.3)
Indirect Bilirubin: 0.4 mg/dL (ref 0.2–1.2)
Total Protein: 6.7 g/dL (ref 6.0–8.3)

## 2015-03-04 LAB — MAGNESIUM: MAGNESIUM: 2 mg/dL (ref 1.5–2.5)

## 2015-03-04 LAB — TSH: TSH: 1.935 u[IU]/mL (ref 0.350–4.500)

## 2015-03-04 MED ORDER — PREDNISOLONE ACETATE 1 % OP SUSP: OPHTHALMIC | Status: AC

## 2015-03-04 MED ORDER — MEMANTINE HCL ER 28 MG PO CP24: 28.0000 mg | ORAL_CAPSULE | Freq: Every day | ORAL | Status: DC

## 2015-03-04 NOTE — Patient Instructions (Signed)

## 2015-03-05 ENCOUNTER — Encounter: Payer: Self-pay | Admitting: Internal Medicine

## 2015-03-05 LAB — VITAMIN D 25 HYDROXY (VIT D DEFICIENCY, FRACTURES): Vit D, 25-Hydroxy: 44 ng/mL (ref 30–100)

## 2015-03-05 LAB — INSULIN, RANDOM: Insulin: 32.7 u[IU]/mL — ABNORMAL HIGH (ref 2.0–19.6)

## 2015-03-05 NOTE — Progress Notes (Signed)
Patient ID: John [name], male   DOB: 09-Jul-1935, 80 y.o.   MRN: [id number]   This very nice 80 y.o. MWM presents for 3 month follow up with Hypertension, Hyperlipidemia, Pre-Diabetes and Vitamin D Deficiency. Patient does present with c/o intermittent ST recall deficits and desires to try meds for memory preservation. He also has had recent scratchy itchy eyes.    Patient is treated for HTN since the 1960's and at times has demonstrated postural hypotension attributed to diabetic autonomic insufficiency. BP has been controlled at home. Today's BP: 120/64 mmHg. Patient did undergo CABG in 1996 and has done well since w/o recurrent anginal type CP's. Patient has had no complaints of  palpitations, dyspnea/orthopnea/PND, dizziness, claudication, or dependent edema.   Hyperlipidemia is controlled with diet & meds. Patient denies myalgias or other med SE's. Last Lipids were  Total Chol 144; HDL 57; LDL  64; Trig 114 on5/07/2015.   Also, the patient has history of T2_NIDDM since 1995 w/ CKD2 and has had no symptoms of reactive hypoglycemia, diabetic polys, paresthesias or visual blurring.  Last A1c was  5.5% on 03/04/2015.c    Further, the patient also has history of Vitamin D Deficiency ofn 18 in 2008 and supplements vitamin D without any suspected side-effects. Last vitamin D was 43 on 12/11/2014.     Medication Sig  . ALPRAZolam (XANAX) 1 MG tablet TAKE ONE TABLET BY MOUTH THREE TIMES DAILY FOR ANXIETY  . aspirin 81 MG tablet Take 81 mg by mouth daily.    . Cholecalciferol (VITAMIN D3) 2000 UNITS capsule Take 2,000 Units by mouth daily.    . clopidogrel (PLAVIX) 75 MG tablet Take 1 tablet (75 mg total) by mouth daily.  [name] FREESTYLE LITE test strip CHECK BLOOD SUGAR 2 TIMES A DAY  . Lancets (FREESTYLE) lancets 1 each by Other route as needed for other. Use as instructed  . loperamide (IMODIUM) 2 MG capsule Take 2 mg by mouth as needed for diarrhea or loose stools.  . nitroGLYCERIN (NITROSTAT) 0.4 MG  SL tablet Sig: 1 tablet under tongue every 3 to 5 minutes as needed for Angina - Please Dispense # 2 bottles of # 25 tabs  . pantoprazole (PROTONIX) 40 MG tablet Take 1 tablet (40 mg total) by mouth daily.  . potassium citrate (UROCIT-K) 10 MEQ (1080 MG) SR tablet Take 1 tablet (10 mEq total) by mouth 2 (two) times daily.  . prochlorperazine (COMPAZINE) 5 MG tablet Take 1-2 tablets TID PRN for nausea.  . promethazine (PHENERGAN) 25 MG tablet Take 1 to 2 tablets every 4 hours as needed for nausea or vomitting  . QUEtiapine (SEROQUEL) 25 MG tablet TAKE ONE TABLET BY MOUTH THREE TIMES DAILY  . simvastatin (ZOCOR) 80 MG tablet Take 40 mg by mouth at bedtime. Takes 1/2 tab=40mg   . VIGAMOX 0.5 % ophthalmic solution    Allergies  Allergen Reactions  . Beta Adrenergic Blockers Other (See Comments)    REACTION: Bradycardia  . Gabapentin Other (See Comments)    Severe confusion  . Prozac [Fluoxetine Hcl] Other (See Comments)    Patient state that it does not agree with him  . Soma [Carisoprodol] Other (See Comments)    Patient stated that it does not agree with him   PMHx:   Past Medical History  Diagnosis Date  . TIA   . SYNCOPE   . HYPERTENSION   . HYPERLIPIDEMIA   . CAD, ARTERY BYPASS GRAFT   . SUPRAVENTRICULAR TACHYCARDIA, HX OF   .  Leukodystrophy   . GASTROESOPHAGEAL REFLUX DISEASE   . ASTHMA   . Diverticulosis   . Hiatal hernia   . Insomnia   . Anxiety   . Blood transfusion     "related to OHS, ulcers"  . Ulcer   . Colon polyps     adenomatous polyps  . COPD   . Sleep apnea     "don't always wear it" (03/20/2014)  . TIA (transient ischemic attack) ~ 2010  . Arthritis     "lower back; left ankle" (03/20/2014)  . DIABETES MELLITUS, TYPE II   . Nephrolithiasis     "I've had over 320 kidney stones" (03/20/2014)   Immunization History  Administered Date(s) Administered  . Influenza, High Dose Seasonal PF 08/13/2013, 08/05/2014  . Pneumococcal Conjugate-13 08/27/2014  .  Pneumococcal Polysaccharide-23 08/06/2012  . Tdap 08/01/2013  . Zoster 05/02/2006   Past Surgical History  Procedure Laterality Date  . Cardiac catheterization    . Gastrectomy    . Vagotomy    . Cystourethroscopy with stent removal.    . Irrigation and debridement sebaceous cyst      "off my back  . Colonoscopy    . Upper gastrointestinal endoscopy    . Excisional hemorrhoidectomy    . Polypectomy    . Coronary artery bypass graft  1996    CABG X2  . Prostate surgery      "took the center of my prostate out"  . Cystoscopy with ureteroscopy, stone basketry and stent placement    . Ankle fracture surgery Left ~ 2012  . Hiatal hernia repair     FHx:    Reviewed / unchanged  SHx:    Reviewed / unchanged  Systems Review:  Constitutional: Denies fever, chills, wt changes, headaches, insomnia, fatigue, night sweats, change in appetite. Eyes: Denies redness, blurred vision, diplopia, discharge, itchy, watery eyes.  ENT: Denies discharge, congestion, post nasal drip, epistaxis, sore throat, earache, hearing loss, dental pain, tinnitus, vertigo, sinus pain, snoring.  CV: Denies chest pain, palpitations, irregular heartbeat, syncope, dyspnea, diaphoresis, orthopnea, PND, claudication or edema. Respiratory: denies cough, dyspnea, DOE, pleurisy, hoarseness, laryngitis, wheezing.  Gastrointestinal: Denies dysphagia, odynophagia, heartburn, reflux, water brash, abdominal pain or cramps, nausea, vomiting, bloating, diarrhea, constipation, hematemesis, melena, hematochezia  or hemorrhoids. Genitourinary: Denies dysuria, frequency, urgency, nocturia, hesitancy, discharge, hematuria or flank pain. Musculoskeletal: Denies arthralgias, myalgias, stiffness, jt. swelling, pain, limping or strain/sprain.  Skin: Denies pruritus, rash, hives, warts, acne, eczema or change in skin lesion(s). Neuro: No weakness, tremor, incoordination, spasms, paresthesia or pain. Psychiatric: Denies confusion, memory  loss or sensory loss. Endo: Denies change in weight, skin or hair change.  Heme/Lymph: No excessive bleeding, bruising or enlarged lymph nodes.  Physical Exam  BP 120/64 mmHg  Pulse 84  Temp(Src) 97.9 F (36.6 C)  Resp 16  Ht 5' 11.75" (1.822 m)  Wt 154 lb (69.854 kg)  BMI 21.04 kg/m2  Appears well nourished and in no distress. Eyes: PERRLA, EOMs, conjunctiva 1 (+) injected w/o exudate. Sinuses: No frontal/maxillary tenderness ENT/Mouth: EAC's clear, TM's nl w/o erythema, bulging. Nares clear w/o erythema, swelling, exudates. Oropharynx clear without erythema or exudates. Oral hygiene is good. Tongue normal, non obstructing. Hearing intact.  Neck: Supple. Thyroid nl. Car 2+/2+ without bruits, nodes or JVD. Chest: Respirations nl with BS clear & equal w/o rales, rhonchi, wheezing or stridor.  Cor: Heart sounds normal w/ regular rate and rhythm without sig. murmurs, gallops, clicks, or rubs. Peripheral pulses normal and equal  without edema.  Abdomen: Soft & bowel sounds normal. Non-tender w/o guarding, rebound, hernias, masses, or organomegaly.  Lymphatics: Unremarkable.  Musculoskeletal: Full ROM all peripheral extremities, joint stability, 5/5 strength, and normal gait.  Skin: Warm, dry without exposed rashes, lesions or ecchymosis apparent.  Neuro: Cranial nerves intact, reflexes equal bilaterally. Sensory-motor testing grossly intact. Tendon reflexes grossly intact.  Pysch: Alert & oriented x 3.  Insight and judgement nl & appropriate. No ideations.  Assessment and Plan:  1. Essential hypertension  - TSH  2. Mixed hyperlipidemia  - Lipid panel  3. Type 2 diabetes mellitus with diabetic nephropathy - diet controlled  - Hemoglobin A1c - Insulin, random  4. Vitamin D deficiency  - Vit D  25 hydroxy   5. ASCAD s/p CABG w/o angina   6. Medication management  - CBC with Differential/Platelet - BASIC METABOLIC PANEL WITH GFR - Hepatic function panel -  Magnesium  7. Memory loss / Mild dementia.  - memantine (NAMENDA XR) 28 MG CP24 24 hr capsule; Take 1 capsule (28 mg total) by mouth daily.  Dispense: 30 capsule; Refill: 99  8. Allergic conjunctivitis  - prednisoLONE acetate (PRED FORTE) 1 % ophthalmic suspension; 1 to 2 drops to affected eye 4 x day as needed for allergies  Dispense: 15 mL; Refill: 1   Recommended regular exercise, BP monitoring, weight control, and discussed med and SE's. Recommended labs to assess and monitor clinical status. Further disposition pending results of labs. Over 30 minutes of exam, counseling, chart review was performed

## 2015-03-11 ENCOUNTER — Other Ambulatory Visit: Payer: Self-pay | Admitting: *Deleted

## 2015-03-11 MED ORDER — CLOPIDOGREL BISULFATE 75 MG PO TABS: 75.0000 mg | ORAL_TABLET | Freq: Every day | ORAL | Status: DC

## 2015-03-11 MED ORDER — SIMVASTATIN 80 MG PO TABS: 80.0000 mg | ORAL_TABLET | Freq: Every day | ORAL | Status: DC

## 2015-03-26 ENCOUNTER — Encounter (HOSPITAL_COMMUNITY): Payer: Self-pay

## 2015-05-05 ENCOUNTER — Other Ambulatory Visit: Payer: Self-pay | Admitting: Internal Medicine

## 2015-06-04 ENCOUNTER — Ambulatory Visit: Payer: Self-pay | Admitting: Internal Medicine

## 2015-06-17 ENCOUNTER — Ambulatory Visit (INDEPENDENT_AMBULATORY_CARE_PROVIDER_SITE_OTHER): Payer: PPO | Admitting: Internal Medicine

## 2015-06-17 ENCOUNTER — Encounter: Payer: Self-pay | Admitting: Internal Medicine

## 2015-06-17 ENCOUNTER — Ambulatory Visit: Payer: Self-pay | Admitting: Internal Medicine

## 2015-06-17 VITALS — BP 106/64 | HR 66 | Temp 98.0°F | Resp 18 | Ht 71.5 in | Wt 150.0 lb

## 2015-06-17 DIAGNOSIS — E559 Vitamin D deficiency, unspecified: Secondary | ICD-10-CM

## 2015-06-17 DIAGNOSIS — E782 Mixed hyperlipidemia: Secondary | ICD-10-CM

## 2015-06-17 DIAGNOSIS — E162 Hypoglycemia, unspecified: Secondary | ICD-10-CM

## 2015-06-17 DIAGNOSIS — I10 Essential (primary) hypertension: Secondary | ICD-10-CM

## 2015-06-17 DIAGNOSIS — Z79899 Other long term (current) drug therapy: Secondary | ICD-10-CM

## 2015-06-17 DIAGNOSIS — E1121 Type 2 diabetes mellitus with diabetic nephropathy: Secondary | ICD-10-CM

## 2015-06-17 DIAGNOSIS — R634 Abnormal weight loss: Secondary | ICD-10-CM

## 2015-06-17 LAB — CBC WITH DIFFERENTIAL/PLATELET
Basophils Absolute: 0 10*3/uL (ref 0.0–0.1)
Basophils Relative: 1 % (ref 0–1)
Eosinophils Absolute: 0.2 10*3/uL (ref 0.0–0.7)
Eosinophils Relative: 4 % (ref 0–5)
HCT: 45.7 % (ref 39.0–52.0)
Hemoglobin: 15.5 g/dL (ref 13.0–17.0)
Lymphocytes Relative: 42 % (ref 12–46)
Lymphs Abs: 1.8 10*3/uL (ref 0.7–4.0)
MCH: 32.7 pg (ref 26.0–34.0)
MCHC: 33.9 g/dL (ref 30.0–36.0)
MCV: 96.4 fL (ref 78.0–100.0)
MONO ABS: 0.4 10*3/uL (ref 0.1–1.0)
MPV: 9.2 fL (ref 8.6–12.4)
Monocytes Relative: 8 % (ref 3–12)
NEUTROS ABS: 2 10*3/uL (ref 1.7–7.7)
Neutrophils Relative %: 45 % (ref 43–77)
Platelets: 236 10*3/uL (ref 150–400)
RBC: 4.74 MIL/uL (ref 4.22–5.81)
RDW: 13.9 % (ref 11.5–15.5)
WBC: 4.4 10*3/uL (ref 4.0–10.5)

## 2015-06-17 LAB — HEPATIC FUNCTION PANEL
ALT: 21 U/L (ref 9–46)
AST: 18 U/L (ref 10–35)
Albumin: 4.1 g/dL (ref 3.6–5.1)
Alkaline Phosphatase: 57 U/L (ref 40–115)
BILIRUBIN DIRECT: 0.1 mg/dL (ref <=0.2)
BILIRUBIN INDIRECT: 0.5 mg/dL (ref 0.2–1.2)
Total Bilirubin: 0.6 mg/dL (ref 0.2–1.2)
Total Protein: 7.1 g/dL (ref 6.1–8.1)

## 2015-06-17 LAB — BASIC METABOLIC PANEL WITH GFR
BUN: 17 mg/dL (ref 7–25)
CALCIUM: 9.5 mg/dL (ref 8.6–10.3)
CO2: 25 mmol/L (ref 20–31)
CREATININE: 1.25 mg/dL — AB (ref 0.70–1.18)
Chloride: 105 mmol/L (ref 98–110)
GFR, EST AFRICAN AMERICAN: 63 mL/min (ref >=60)
GFR, Est Non African American: 55 mL/min — ABNORMAL LOW (ref >=60)
Glucose, Bld: 102 mg/dL — ABNORMAL HIGH (ref 65–99)
Potassium: 5 mmol/L (ref 3.5–5.3)
SODIUM: 141 mmol/L (ref 135–146)

## 2015-06-17 LAB — LIPID PANEL
CHOLESTEROL: 170 mg/dL (ref 125–200)
HDL: 64 mg/dL (ref >=40)
LDL CALC: 82 mg/dL (ref <130)
TRIGLYCERIDES: 120 mg/dL (ref <150)
Total CHOL/HDL Ratio: 2.7 Ratio (ref <=5.0)
VLDL: 24 mg/dL (ref <30)

## 2015-06-17 LAB — TSH: TSH: 2.016 u[IU]/mL (ref 0.350–4.500)

## 2015-06-17 LAB — MAGNESIUM: MAGNESIUM: 2 mg/dL (ref 1.5–2.5)

## 2015-06-17 NOTE — Progress Notes (Signed)
Patient ID: John [name], male   DOB: 1935-02-05, 80 y.o.   MRN: [id number]  Assessment and Plan:  Hypertension:  -Continue medication,  -monitor blood pressure at home.  -Continue DASH diet.   -Reminder to go to the ER if any CP, SOB, nausea, dizziness, severe HA, changes vision/speech, left arm numbness and tingling, and jaw pain.  Cholesterol: -Continue diet and exercise.  -Check cholesterol.   Pre-diabetes: -Continue diet and exercise.  -Check A1C  Vitamin D Def: -check level -continue medications.   Unintentional weight loss with multiple episodes of dropping blood glucose -malignancy workup with CT abdomen and pelvis and ct chest  Continue diet and meds as discussed. Further disposition pending results of labs.  HPI 80 y.o. male  presents for 3 month follow up with hypertension, hyperlipidemia, prediabetes and vitamin D.   His blood pressure has been controlled at home, today their BP is BP: 106/64 mmHg.   He does workout. He denies chest pain, shortness of breath, dizziness.  He reports that this is his normal blood pressure.  He reports that he is not walking as often.     He is on cholesterol medication and denies myalgias. His cholesterol is at goal. The cholesterol last visit was:   Lab Results  Component Value Date   CHOL 144 03/04/2015   HDL 57 03/04/2015   LDLCALC 64 03/04/2015   TRIG 114 03/04/2015   CHOLHDL 2.5 03/04/2015     He has been working on diet and exercise for prediabetes, and denies foot ulcerations, hyperglycemia, hypoglycemia , increased appetite, nausea, paresthesia of the feet, polydipsia, polyuria, visual disturbances, vomiting and weight loss. Last A1C in the office was:  Lab Results  Component Value Date   HGBA1C 5.5 03/04/2015    Patient is on Vitamin D supplement.  Lab Results  Component Value Date   VD25OH 44 03/04/2015     Patient reports that he is continuously losing weight.  He reports that he is not trying.  He has a  poor appetite but he has been eating normal portion sizes.  He reports that he is a former smoker and smoked nearly 25 years.  He smoked about a pack per day.  He quit nearly 40 years ago.  He does have colonoscopies done every five years and he has had numerous polyps removed.    He continues to have quickly dropping blood sugars.  He has now had two episodes of quickly dropping blood sugars to less than 30.    Current Medications:  Current Outpatient Prescriptions on File Prior to Visit  Medication Sig Dispense Refill  . ALPRAZolam (XANAX) 1 MG tablet TAKE ONE TABLET BY MOUTH THREE TIMES DAILY FOR ANXIETY 270 tablet 1  . aspirin 81 MG tablet Take 81 mg by mouth daily.      . Cholecalciferol (VITAMIN D3) 2000 UNITS capsule Take 6,000 Units by mouth daily.     . clopidogrel (PLAVIX) 75 MG tablet Take 1 tablet (75 mg total) by mouth daily. 90 tablet 3  . FREESTYLE LITE test strip CHECK BLOOD SUGAR 2 TIMES A DAY 150 each PRN  . Lancets (FREESTYLE) lancets 1 each by Other route as needed for other. Use as instructed    . loperamide (IMODIUM) 2 MG capsule Take 2 mg by mouth as needed for diarrhea or loose stools.    . nitroGLYCERIN (NITROSTAT) 0.4 MG SL tablet Sig: 1 tablet under tongue every 3 to 5 minutes as needed for Angina - Please  Dispense # 2 bottles of # 25 tabs 50 tablet 99  . pantoprazole (PROTONIX) 40 MG tablet Take 1 tablet (40 mg total) by mouth daily. 90 tablet 3  . potassium citrate (UROCIT-K) 10 MEQ (1080 MG) SR tablet Take 1 tablet (10 mEq total) by mouth 2 (two) times daily. 180 tablet 3  . prednisoLONE acetate (PRED FORTE) 1 % ophthalmic suspension 1 to 2 drops to affected eye 4 x day as needed for allergies 15 mL 1  . prochlorperazine (COMPAZINE) 5 MG tablet Take 1-2 tablets TID PRN for nausea. 50 tablet 0  . promethazine (PHENERGAN) 25 MG tablet Take 1 to 2 tablets every 4 hours as needed for nausea or vomitting 100 tablet 99  . QUEtiapine (SEROQUEL) 25 MG tablet TAKE ONE TABLET  BY MOUTH THREE TIMES DAILY 90 tablet 5  . simvastatin (ZOCOR) 80 MG tablet Take 1 tablet (80 mg total) by mouth at bedtime. 90 tablet 3  . VIGAMOX 0.5 % ophthalmic solution   0   No current facility-administered medications on file prior to visit.    Medical History:  Past Medical History  Diagnosis Date  . TIA   . SYNCOPE   . HYPERTENSION   . HYPERLIPIDEMIA   . CAD, ARTERY BYPASS GRAFT   . SUPRAVENTRICULAR TACHYCARDIA, HX OF   . Leukodystrophy   . GASTROESOPHAGEAL REFLUX DISEASE   . ASTHMA   . Diverticulosis   . Hiatal hernia   . Insomnia   . Anxiety   . Blood transfusion     "related to OHS, ulcers"  . Ulcer   . Colon polyps     adenomatous polyps  . COPD   . Sleep apnea     "don't always wear it" (03/20/2014)  . TIA (transient ischemic attack) ~ 2010  . Arthritis     "lower back; left ankle" (03/20/2014)  . DIABETES MELLITUS, TYPE II   . Nephrolithiasis     "I've had over 320 kidney stones" (03/20/2014)    Allergies:  Allergies  Allergen Reactions  . Beta Adrenergic Blockers Other (See Comments)    REACTION: Bradycardia  . Gabapentin Other (See Comments)    Severe confusion  . Prozac [Fluoxetine Hcl] Other (See Comments)    Patient state that it does not agree with him  . Soma [Carisoprodol] Other (See Comments)    Patient stated that it does not agree with him     Review of Systems:  Review of Systems  Constitutional: Positive for weight loss and malaise/fatigue. Negative for fever, chills and diaphoresis.  HENT: Negative for congestion, ear pain and sore throat.   Eyes: Positive for blurred vision.  Respiratory: Positive for cough and wheezing. Negative for shortness of breath.   Cardiovascular: Negative for chest pain, palpitations and leg swelling.  Gastrointestinal: Positive for diarrhea. Negative for heartburn, constipation, blood in stool and melena.  Genitourinary: Negative.   Skin: Negative.   Neurological: Negative for dizziness, loss of  consciousness, weakness and headaches.  Psychiatric/Behavioral: Negative for depression. The patient is not nervous/anxious and does not have insomnia.     Family history- Review and unchanged  Social history- Review and unchanged  Physical Exam: BP 106/64 mmHg  Pulse 66  Temp(Src) 98 F (36.7 C) (Temporal)  Resp 18  Ht 5' 11.5" (1.816 m)  Wt 150 lb (68.04 kg)  BMI 20.63 kg/m2 Wt Readings from Last 3 Encounters:  06/17/15 150 lb (68.04 kg)  03/04/15 154 lb (69.854 kg)  02/06/15 156 lb 6.4  oz (70.943 kg)    General Appearance: Well nourished well developed, in no apparent distress. Eyes: PERRLA, EOMs, conjunctiva no swelling or erythema ENT/Mouth: Ear canals normal without obstruction, swelling, erythma, discharge.  TMs normal bilaterally.  Oropharynx moist, clear, without exudate, or postoropharyngeal swelling. Neck: Supple, thyroid normal,no cervical adenopathy  Respiratory: Respiratory effort normal, Breath sounds clear A&P without rhonchi, wheeze, or rale.  No retractions, no accessory usage. Cardio: RRR with no MRGs. Brisk peripheral pulses without edema.  Abdomen: Soft, + BS,  Non tender, no guarding, rebound, hernias, masses. Musculoskeletal: Full ROM, 5/5 strength, Normal gait Skin: Warm, dry without rashes, lesions, ecchymosis.  Neuro: Awake and oriented X 3, Cranial nerves intact. Normal muscle tone, no cerebellar symptoms. Psych: Normal affect, Insight and Judgment appropriate.    Starlyn Skeans, PA-C 12:14 PM [facility]

## 2015-06-17 NOTE — Patient Instructions (Signed)
Please start taking either boost, ensure, cliff bars, or luna bars as snacks in between meals to help bring blood sugars and weight coming back up.  Please start eating cracker and peanut butter right before bedtime to keep your blood sugars level.

## 2015-06-18 ENCOUNTER — Other Ambulatory Visit: Payer: Self-pay | Admitting: Internal Medicine

## 2015-06-18 DIAGNOSIS — R634 Abnormal weight loss: Secondary | ICD-10-CM

## 2015-06-18 LAB — INSULIN, RANDOM: INSULIN: 7.5 u[IU]/mL (ref 2.0–19.6)

## 2015-06-18 LAB — HEMOGLOBIN A1C
Hgb A1c MFr Bld: 5.5 % (ref <5.7)
Mean Plasma Glucose: 111 mg/dL (ref <117)

## 2015-06-18 LAB — VITAMIN D 25 HYDROXY (VIT D DEFICIENCY, FRACTURES): VIT D 25 HYDROXY: 53 ng/mL (ref 30–100)

## 2015-06-19 ENCOUNTER — Encounter: Payer: Self-pay | Admitting: Internal Medicine

## 2015-06-19 ENCOUNTER — Other Ambulatory Visit: Payer: Self-pay | Admitting: Internal Medicine

## 2015-06-19 DIAGNOSIS — R6889 Other general symptoms and signs: Secondary | ICD-10-CM

## 2015-06-25 ENCOUNTER — Encounter (HOSPITAL_COMMUNITY): Payer: Self-pay

## 2015-06-25 ENCOUNTER — Ambulatory Visit (HOSPITAL_COMMUNITY)
Admission: RE | Admit: 2015-06-25 | Discharge: 2015-06-25 | Disposition: A | Discharge status: Home / Self Care | Payer: PPO | Source: Ambulatory Visit | Attending: Internal Medicine | Admitting: Internal Medicine

## 2015-06-25 DIAGNOSIS — K449 Diaphragmatic hernia without obstruction or gangrene: Secondary | ICD-10-CM | POA: Diagnosis not present

## 2015-06-25 DIAGNOSIS — Z9889 Other specified postprocedural states: Secondary | ICD-10-CM | POA: Insufficient documentation

## 2015-06-25 DIAGNOSIS — R634 Abnormal weight loss: Secondary | ICD-10-CM | POA: Diagnosis present

## 2015-06-25 DIAGNOSIS — Z86010 Personal history of colonic polyps: Secondary | ICD-10-CM | POA: Diagnosis present

## 2015-06-25 DIAGNOSIS — N400 Enlarged prostate without lower urinary tract symptoms: Secondary | ICD-10-CM | POA: Diagnosis not present

## 2015-06-25 DIAGNOSIS — R6889 Other general symptoms and signs: Secondary | ICD-10-CM

## 2015-06-25 DIAGNOSIS — Z789 Other specified health status: Secondary | ICD-10-CM | POA: Insufficient documentation

## 2015-06-25 MED ORDER — IOHEXOL 300 MG/ML  SOLN
100.0000 mL | Freq: Once | INTRAMUSCULAR | Status: AC | PRN
  Administered 2015-06-25: 100 mL via INTRAVENOUS

## 2015-06-26 ENCOUNTER — Other Ambulatory Visit: Payer: Self-pay | Admitting: Internal Medicine

## 2015-06-26 DIAGNOSIS — E162 Hypoglycemia, unspecified: Secondary | ICD-10-CM

## 2015-07-03 ENCOUNTER — Ambulatory Visit (INDEPENDENT_AMBULATORY_CARE_PROVIDER_SITE_OTHER): Payer: PPO | Admitting: Internal Medicine

## 2015-07-03 ENCOUNTER — Encounter: Payer: Self-pay | Admitting: Internal Medicine

## 2015-07-03 VITALS — BP 78/54 | HR 68 | Temp 97.0°F | Resp 18 | Ht 71.5 in | Wt 142.0 lb

## 2015-07-03 DIAGNOSIS — I951 Orthostatic hypotension: Secondary | ICD-10-CM | POA: Diagnosis not present

## 2015-07-03 DIAGNOSIS — S0181XA Laceration without foreign body of other part of head, initial encounter: Secondary | ICD-10-CM | POA: Diagnosis not present

## 2015-07-04 ENCOUNTER — Other Ambulatory Visit: Payer: Self-pay | Admitting: Internal Medicine

## 2015-07-04 DIAGNOSIS — I951 Orthostatic hypotension: Secondary | ICD-10-CM

## 2015-07-04 MED ORDER — FLUDROCORTISONE ACETATE 0.1 MG PO TABS: ORAL_TABLET | ORAL | Status: DC

## 2015-07-05 ENCOUNTER — Encounter: Payer: Self-pay | Admitting: Internal Medicine

## 2015-07-05 ENCOUNTER — Other Ambulatory Visit: Payer: Self-pay | Admitting: Internal Medicine

## 2015-07-05 NOTE — Progress Notes (Signed)
Subjective:    Patient ID: John [name], male    DOB: 1935-04-17, 80 y.o.   MRN: [id number]  HPI Patient is brought in by son-in-law afrer falling at home going to the bathroom striking and lacerating his forehead. Has had no suspect neuro sx's, but BP is low which has been a chronic problem for him with autonomic insufficiency.   Medication Sig  . ALPRAZolam (XANAX) 1 MG tablet TAKE ONE TABLET BY MOUTH THREE TIMES DAILY FOR ANXIETY  . aspirin 81 MG tablet Take 81 mg by mouth daily.    . Cholecalciferol (VITAMIN D3) 2000 UNITS capsule Take 6,000 Units by mouth daily.   . clopidogrel (PLAVIX) 75 MG tablet Take 1 tablet (75 mg total) by mouth daily.  [name] FREESTYLE LITE test strip CHECK BLOOD SUGAR 2 TIMES A DAY  . Lancets (FREESTYLE) lancets 1 each by Other route as needed for other. Use as instructed  . loperamide (IMODIUM) 2 MG capsule Take 2 mg by mouth as needed for diarrhea or loose stools.  . nitroGLYCERIN (NITROSTAT) 0.4 MG SL tablet Sig: 1 tablet under tongue every 3 to 5 minutes as needed for Angina - Please Dispense # 2 bottles of # 25 tabs  . pantoprazole (PROTONIX) 40 MG tablet Take 1 tablet (40 mg total) by mouth daily.  . potassium citrate (UROCIT-K) 10 MEQ (1080 MG) SR tablet Take 1 tablet (10 mEq total) by mouth 2 (two) times daily.  . prednisoLONE acetate (PRED FORTE) 1 % ophthalmic suspension 1 to 2 drops to affected eye 4 x day as needed for allergies  . prochlorperazine (COMPAZINE) 5 MG tablet Take 1-2 tablets TID PRN for nausea.  . promethazine (PHENERGAN) 25 MG tablet Take 1 to 2 tablets every 4 hours as needed for nausea or vomitting  . QUEtiapine (SEROQUEL) 25 MG tablet TAKE ONE TABLET BY MOUTH THREE TIMES DAILY  . simvastatin (ZOCOR) 80 MG tablet Take 1 tablet (80 mg total) by mouth at bedtime.  [name] VIGAMOX 0.5 % ophthalmic solution    Allergies  Allergen Reactions  . Beta Adrenergic Blockers Other (See Comments)    REACTION: Bradycardia  . Gabapentin Other (See  Comments)    Severe confusion  . Prozac [Fluoxetine Hcl] Other (See Comments)    Patient state that it does not agree with him  . Soma [Carisoprodol] Other (See Comments)    Patient stated that it does not agree with him   Past Medical History  Diagnosis Date  . TIA   . SYNCOPE   . HYPERTENSION   . HYPERLIPIDEMIA   . CAD, ARTERY BYPASS GRAFT   . SUPRAVENTRICULAR TACHYCARDIA, HX OF   . Leukodystrophy   . GASTROESOPHAGEAL REFLUX DISEASE   . ASTHMA   . Diverticulosis   . Hiatal hernia   . Insomnia   . Anxiety   . Blood transfusion     "related to OHS, ulcers"  . Ulcer   . Colon polyps     adenomatous polyps  . COPD   . Sleep apnea     "don't always wear it" (03/20/2014)  . TIA (transient ischemic attack) ~ 2010  . Arthritis     "lower back; left ankle" (03/20/2014)  . DIABETES MELLITUS, TYPE II   . Nephrolithiasis     "I've had over 320 kidney stones" (03/20/2014)   Past Surgical History  Procedure Laterality Date  . Cardiac catheterization    . Gastrectomy    . Vagotomy    . Cystourethroscopy  with stent removal.    . Irrigation and debridement sebaceous cyst      "off my back  . Colonoscopy    . Upper gastrointestinal endoscopy    . Excisional hemorrhoidectomy    . Polypectomy    . Coronary artery bypass graft  1996    CABG X2  . Prostate surgery      "took the center of my prostate out"  . Cystoscopy with ureteroscopy, stone basketry and stent placement    . Ankle fracture surgery Left ~ 2012  . Hiatal hernia repair     Review of Systems  10 point systems review negative except as above.    Objective:   Physical Exam  BP 78/54 mmHg  Pulse 68  Temp(Src) 97 F (36.1 C)  Resp 18  Ht 5' 11.5" (1.816 m)  Wt 142 lb (64.411 kg)  BMI 19.53 kg/m2   1" laceration above the right eyebrow.  HEENT - Eac's patent. TM's Nl. EOM's full. PERRLA. NasoOroPharynx clear. Neck - supple. Nl Thyroid. Carotids 2+ & No bruits, nodes, JVD Chest - Clear equal BS w/o Rales,  rhonchi, wheezes. Cor - Nl HS. RRR w/o sig MGR. PP 1(+). No edema. Abd - No palpable organomegaly, masses or tenderness. BS nl. MS- FROM w/o deformities. Muscle power, tone and bulk Nl. Gait Nl. Neuro - No obvious Cr N abnormalities. Sensory, motor and Cerebellar functions appear Nl w/o focal abnormalities. Psyche - Mental status normal & A/O x 3.    Procedure: (CPT - E3982582) - After informed consent and aseptic prep and local anesthesia with 1.5 ml Marcaine 0.5% w/epi the wound was cleansed with alcohol. Then the wound was approximated with # 2 inverting sutures with Chromic 4-0. Then the wound edges were approximates and aligned with # 3 sutures Nylon 4-0. The wound was cleansed with green soap an antibiotic ung and sterile bandage and wound care was advised to patient.     Assessment & Plan:   1. Autonomic postural hypotension  - Given water & V-8 juice to drink along with crackers  - Advised 'push' po fluids  2. Laceration of forehead, complicated, initial encounter  - Procedure: (CPT 12051)   - ROV - 5 days for suture removal.

## 2015-07-09 ENCOUNTER — Other Ambulatory Visit: Payer: Self-pay | Admitting: Internal Medicine

## 2015-07-09 ENCOUNTER — Encounter: Payer: Self-pay | Admitting: Internal Medicine

## 2015-07-09 DIAGNOSIS — Z79899 Other long term (current) drug therapy: Secondary | ICD-10-CM

## 2015-07-09 DIAGNOSIS — K219 Gastro-esophageal reflux disease without esophagitis: Secondary | ICD-10-CM | POA: Insufficient documentation

## 2015-07-09 DIAGNOSIS — E162 Hypoglycemia, unspecified: Secondary | ICD-10-CM

## 2015-07-10 LAB — BASIC METABOLIC PANEL WITH GFR
BUN: 17 mg/dL (ref 7–25)
CALCIUM: 9.7 mg/dL (ref 8.6–10.3)
CO2: 26 mmol/L (ref 20–31)
Chloride: 108 mmol/L (ref 98–110)
Creat: 1.17 mg/dL (ref 0.70–1.18)
GFR, EST AFRICAN AMERICAN: 69 mL/min (ref >=60)
GFR, EST NON AFRICAN AMERICAN: 59 mL/min — AB (ref >=60)
GLUCOSE: 121 mg/dL — AB (ref 65–99)
Potassium: 4.6 mmol/L (ref 3.5–5.3)
Sodium: 141 mmol/L (ref 135–146)

## 2015-07-22 ENCOUNTER — Ambulatory Visit: Payer: Self-pay | Admitting: Internal Medicine

## 2015-07-23 ENCOUNTER — Encounter: Payer: Self-pay | Admitting: Internal Medicine

## 2015-07-23 ENCOUNTER — Ambulatory Visit (INDEPENDENT_AMBULATORY_CARE_PROVIDER_SITE_OTHER): Payer: PPO | Admitting: Internal Medicine

## 2015-07-23 VITALS — BP 110/62 | HR 88 | Temp 97.9°F | Resp 16 | Ht 71.75 in | Wt 154.6 lb

## 2015-07-23 DIAGNOSIS — I10 Essential (primary) hypertension: Secondary | ICD-10-CM

## 2015-07-23 DIAGNOSIS — E1121 Type 2 diabetes mellitus with diabetic nephropathy: Secondary | ICD-10-CM

## 2015-07-23 DIAGNOSIS — Z6821 Body mass index (BMI) 21.0-21.9, adult: Secondary | ICD-10-CM | POA: Diagnosis not present

## 2015-07-23 DIAGNOSIS — I951 Orthostatic hypotension: Secondary | ICD-10-CM

## 2015-07-23 DIAGNOSIS — Z23 Encounter for immunization: Secondary | ICD-10-CM | POA: Diagnosis not present

## 2015-07-23 DIAGNOSIS — Z79899 Other long term (current) drug therapy: Secondary | ICD-10-CM

## 2015-07-23 LAB — BASIC METABOLIC PANEL WITH GFR
BUN: 13 mg/dL (ref 7–25)
CHLORIDE: 106 mmol/L (ref 98–110)
CO2: 32 mmol/L — ABNORMAL HIGH (ref 20–31)
CREATININE: 1.13 mg/dL (ref 0.70–1.18)
Calcium: 8.8 mg/dL (ref 8.6–10.3)
GFR, Est African American: 72 mL/min (ref >=60)
GFR, Est Non African American: 62 mL/min (ref >=60)
GLUCOSE: 81 mg/dL (ref 65–99)
POTASSIUM: 4.6 mmol/L (ref 3.5–5.3)
Sodium: 142 mmol/L (ref 135–146)

## 2015-07-23 NOTE — Progress Notes (Signed)
Subjective:    Patient ID: John [name], male    DOB: July 07, 1935, 80 y.o.   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 No recent orthostatic episodes, CP, palpitations , dyspnea or dependent edema. Altho with weight loss & normalization of A1c's he has had episodic hypoglycemic episodes and has been evaluated for reactive hypoglycemia and to r/o insulinoma. .   Medication Sig  . ALPRAZolam (XANAX) 1 MG tablet TAKE ONE TABLET BY MOUTH THREE TIMES DAILY FOR ANXIETY  . aspirin 81 MG tablet Take 81 mg by mouth daily.    . Cholecalciferol (VITAMIN D3) 2000 UNITS capsule Take 6,000 Units by mouth daily.   . clopidogrel (PLAVIX) 75 MG tablet Take 1 tablet (75 mg total) by mouth daily.  . fludrocortisone (FLORINEF) 0.1 MG tablet Take 1 tablet 2 x day or as directed for Low BP  . FREESTYLE LITE test strip CHECK BLOOD SUGAR 2 TIMES A DAY  . Lancets (FREESTYLE) lancets 1 each by Other route as needed for other. Use as instructed  . loperamide (IMODIUM) 2 MG capsule Take 2 mg by mouth as needed for diarrhea or loose stools.  . nitroGLYCERIN (NITROSTAT) 0.4 MG SL tablet Sig: 1 tablet under tongue every 3 to 5 minutes as needed for Angina - Please Dispense # 2 bottles of # 25 tabs  . pantoprazole (PROTONIX) 40 MG tablet Take 1 tablet (40 mg total) by mouth daily.  . potassium citrate (UROCIT-K) 10 MEQ (1080 MG) SR tablet Take 1 tablet (10 mEq total) by mouth 2 (two) times daily.  . prednisoLONE acetate (PRED FORTE) 1 % ophthalmic suspension 1 to 2 drops to affected eye 4 x day as needed for allergies  . prochlorperazine (COMPAZINE) 5 MG tablet Take 1-2 tablets TID PRN for nausea.  . promethazine (PHENERGAN) 25 MG tablet Take 1 to 2 tablets every 4 hours as needed for nausea or  vomitting  . QUEtiapine (SEROQUEL) 25 MG tablet TAKE ONE TABLET BY MOUTH THREE TIMES DAILY  . simvastatin (ZOCOR) 80 MG tablet Take 1 tablet (80 mg total) by mouth at bedtime.  [name] VIGAMOX 0.5 % ophthalmic solution    Allergies  Allergen Reactions  . Beta Adrenergic Blockers Other (See Comments)    REACTION: Bradycardia  . Gabapentin Other (See Comments)    Severe confusion  . Prozac [Fluoxetine Hcl] Other (See Comments)    Patient state that it does not agree with him  . Soma [Carisoprodol] Other (See Comments)    Patient stated that it does not agree with him   [phone] (home)  Past Surgical History  Procedure Laterality Date  . Cardiac catheterization    . Gastrectomy    . Vagotomy    . Cystourethroscopy with stent removal.    . Irrigation and debridement sebaceous cyst      "off my back  . Colonoscopy    . Upper gastrointestinal endoscopy    . Excisional hemorrhoidectomy    . Polypectomy    . Coronary artery bypass graft  1996    CABG X2  . Prostate surgery      "took the center of my prostate out"  . Cystoscopy with ureteroscopy, stone basketry and stent placement    . Ankle fracture surgery Left ~ 2012  .  Hiatal hernia repair     Review of Systems 10 point systems review negative except as above.    Objective:   Physical Exam  BP 110/62 mmHg  Pulse 88  Temp(Src) 97.9 F (36.6 C)  Resp 16  Ht 5' 11.75" (1.822 m)  Wt 154 lb 9.6 oz (70.126 kg)  BMI 21.12 kg/m2  Repeat sitting BP 130/80 w/ P 80 and standing BP 130/88 w/ P 86.  HEENT - Eac's patent. TM's Nl. EOM's full. PERRLA. NasoOroPharynx clear. Neck - supple. Nl Thyroid. Carotids 2+ & No bruits, nodes, JVD Chest - Clear equal BS w/o Rales, rhonchi, wheezes. Cor - Nl HS. RRR w/o sig MGR. PP 1(+). No edema. Abd - No palpable organomegaly, masses or tenderness. BS nl. MS- FROM w/o deformities. Muscle power, tone and bulk Nl. Gait Nl. Neuro - No obvious Cr N abnormalities. Sensory, motor and Cerebellar  functions appear Nl w/o focal abnormalities. Psyche - Mental status normal & appropriate.  No delusions, ideations or obvious mood abnormalities.    Assessment & Plan:   1. Essential hypertension   2. Autonomic postural hypotension   3. Type 2 diabetes mellitus with diabetic nephropathy  - Insulin, random  4. Medication management  - BASIC METABOLIC PANEL WITH GFR  5. Body mass index (BMI) of 21.0-21.9 in adult   6. Need for prophylactic vaccination and inoculation against influenza  - Flu vaccine HIGH DOSE PF (Fluzone High dose)

## 2015-07-24 ENCOUNTER — Other Ambulatory Visit: Payer: Self-pay | Admitting: Internal Medicine

## 2015-07-24 DIAGNOSIS — E162 Hypoglycemia, unspecified: Secondary | ICD-10-CM

## 2015-07-24 LAB — INSULIN, RANDOM: INSULIN: 4.7 u[IU]/mL (ref 2.0–19.6)

## 2015-09-04 ENCOUNTER — Encounter: Payer: Self-pay | Admitting: Internal Medicine

## 2015-09-04 ENCOUNTER — Ambulatory Visit (INDEPENDENT_AMBULATORY_CARE_PROVIDER_SITE_OTHER): Payer: PPO | Admitting: Internal Medicine

## 2015-09-04 VITALS — BP 128/66 | HR 78 | Temp 98.0°F | Resp 16 | Ht 71.0 in | Wt 155.0 lb

## 2015-09-04 DIAGNOSIS — E782 Mixed hyperlipidemia: Secondary | ICD-10-CM

## 2015-09-04 DIAGNOSIS — I951 Orthostatic hypotension: Secondary | ICD-10-CM | POA: Diagnosis not present

## 2015-09-04 DIAGNOSIS — I10 Essential (primary) hypertension: Secondary | ICD-10-CM

## 2015-09-04 DIAGNOSIS — E1121 Type 2 diabetes mellitus with diabetic nephropathy: Secondary | ICD-10-CM | POA: Diagnosis not present

## 2015-09-04 DIAGNOSIS — Z789 Other specified health status: Secondary | ICD-10-CM | POA: Diagnosis not present

## 2015-09-04 DIAGNOSIS — Z6821 Body mass index (BMI) 21.0-21.9, adult: Secondary | ICD-10-CM

## 2015-09-04 DIAGNOSIS — E559 Vitamin D deficiency, unspecified: Secondary | ICD-10-CM

## 2015-09-04 DIAGNOSIS — Z79899 Other long term (current) drug therapy: Secondary | ICD-10-CM

## 2015-09-04 DIAGNOSIS — Z1389 Encounter for screening for other disorder: Secondary | ICD-10-CM | POA: Diagnosis not present

## 2015-09-04 DIAGNOSIS — Z9181 History of falling: Secondary | ICD-10-CM

## 2015-09-04 DIAGNOSIS — Z0001 Encounter for general adult medical examination with abnormal findings: Secondary | ICD-10-CM | POA: Diagnosis not present

## 2015-09-04 DIAGNOSIS — Z1331 Encounter for screening for depression: Secondary | ICD-10-CM

## 2015-09-04 DIAGNOSIS — Z125 Encounter for screening for malignant neoplasm of prostate: Secondary | ICD-10-CM

## 2015-09-04 DIAGNOSIS — K219 Gastro-esophageal reflux disease without esophagitis: Secondary | ICD-10-CM | POA: Diagnosis not present

## 2015-09-04 DIAGNOSIS — Z1212 Encounter for screening for malignant neoplasm of rectum: Secondary | ICD-10-CM

## 2015-09-04 DIAGNOSIS — R6889 Other general symptoms and signs: Secondary | ICD-10-CM

## 2015-09-04 DIAGNOSIS — I25700 Atherosclerosis of coronary artery bypass graft(s), unspecified, with unstable angina pectoris: Secondary | ICD-10-CM

## 2015-09-04 LAB — MAGNESIUM: MAGNESIUM: 2 mg/dL (ref 1.5–2.5)

## 2015-09-04 LAB — LIPID PANEL
CHOLESTEROL: 142 mg/dL (ref 125–200)
HDL: 64 mg/dL (ref >=40)
LDL CALC: 65 mg/dL (ref <130)
TRIGLYCERIDES: 65 mg/dL (ref <150)
Total CHOL/HDL Ratio: 2.2 Ratio (ref <=5.0)
VLDL: 13 mg/dL (ref <30)

## 2015-09-04 LAB — CBC WITH DIFFERENTIAL/PLATELET
BASOS PCT: 1 % (ref 0–1)
Basophils Absolute: 0 10*3/uL (ref 0.0–0.1)
EOS PCT: 5 % (ref 0–5)
Eosinophils Absolute: 0.2 10*3/uL (ref 0.0–0.7)
HEMATOCRIT: 40.8 % (ref 39.0–52.0)
HEMOGLOBIN: 14.1 g/dL (ref 13.0–17.0)
Lymphocytes Relative: 45 % (ref 12–46)
Lymphs Abs: 1.9 10*3/uL (ref 0.7–4.0)
MCH: 32.7 pg (ref 26.0–34.0)
MCHC: 34.6 g/dL (ref 30.0–36.0)
MCV: 94.7 fL (ref 78.0–100.0)
MONO ABS: 0.3 10*3/uL (ref 0.1–1.0)
MONOS PCT: 7 % (ref 3–12)
MPV: 9.3 fL (ref 8.6–12.4)
NEUTROS ABS: 1.8 10*3/uL (ref 1.7–7.7)
Neutrophils Relative %: 42 % — ABNORMAL LOW (ref 43–77)
Platelets: 181 10*3/uL (ref 150–400)
RBC: 4.31 MIL/uL (ref 4.22–5.81)
RDW: 14.1 % (ref 11.5–15.5)
WBC: 4.2 10*3/uL (ref 4.0–10.5)

## 2015-09-04 LAB — BASIC METABOLIC PANEL WITH GFR
BUN: 16 mg/dL (ref 7–25)
CALCIUM: 8.7 mg/dL (ref 8.6–10.3)
CO2: 32 mmol/L — AB (ref 20–31)
CREATININE: 1.14 mg/dL (ref 0.70–1.18)
Chloride: 105 mmol/L (ref 98–110)
GFR, EST AFRICAN AMERICAN: 71 mL/min (ref >=60)
GFR, Est Non African American: 61 mL/min (ref >=60)
GLUCOSE: 122 mg/dL — AB (ref 65–99)
Potassium: 4 mmol/L (ref 3.5–5.3)
Sodium: 144 mmol/L (ref 135–146)

## 2015-09-04 LAB — HEMOGLOBIN A1C
Hgb A1c MFr Bld: 5.4 % (ref <5.7)
MEAN PLASMA GLUCOSE: 108 mg/dL (ref <117)

## 2015-09-04 LAB — HEPATIC FUNCTION PANEL
ALBUMIN: 3.6 g/dL (ref 3.6–5.1)
ALK PHOS: 60 U/L (ref 40–115)
ALT: 17 U/L (ref 9–46)
AST: 17 U/L (ref 10–35)
BILIRUBIN TOTAL: 0.5 mg/dL (ref 0.2–1.2)
Bilirubin, Direct: 0.1 mg/dL (ref <=0.2)
Indirect Bilirubin: 0.4 mg/dL (ref 0.2–1.2)
TOTAL PROTEIN: 6.1 g/dL (ref 6.1–8.1)

## 2015-09-04 LAB — TSH: TSH: 1.795 u[IU]/mL (ref 0.350–4.500)

## 2015-09-04 NOTE — Progress Notes (Signed)
Patient ID: John [name], male   DOB: [birth year], 80 y.o.   MRN: [id number]     Assessment:   1. Essential hypertension  - EKG 12-Lead - [place], RETROPERITNL ABD,  LTD - TSH  2. Mixed hyperlipidemia  - Lipid panel  3. Type 2 diabetes mellitus with diabetic nephropathy, without long-term current use of insulin (HCC)  - Microalbumin / creatinine urine ratio - HM DIABETES FOOT EXAM - LOW EXTREMITY NEUR EXAM DOCUM - Hemoglobin A1c - Insulin, random  4. Vitamin D deficiency  - Vit D  25 hydroxy   5. Autonomic postural hypotension   6. 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 Gastroesophageal reflux disease   8. Screening for rectal cancer  - POC Hemoccult Bld/Stl (3-Cd Home Screen); Future  9. Prostate cancer screening  - PSA  10. Depression screen   11. At low risk for fall   12. Medication management  - Urinalysis, Routine w reflex microscopic  - CBC with Differential/Platelet - BASIC METABOLIC PANEL WITH GFR - Hepatic function panel - Magnesium  13. Body mass index (BMI) of 21.0-21.9 in adult   Plan:   During the course of the visit the patient was educated and counseled about appropriate screening and preventive services including:    Pneumococcal vaccine   Influenza vaccine  Td vaccine  Screening electrocardiogram  Bone densitometry screening  Colorectal cancer screening  Diabetes screening  Glaucoma screening  Nutrition counseling   Advanced directives: requested  Screening recommendations, referrals: Vaccinations: Immunization History  Administered Date(s) Administered  . Influenza, High Dose Seasonal PF 08/13/2013, 08/05/2014, 07/23/2015  . Pneumococcal Conjugate-13 08/27/2014  . Pneumococcal Polysaccharide-23 08/06/2012  . Tdap 08/01/2013  . Zoster 05/02/2006  Hep B vaccine not indicated  Nutrition assessed and recommended  Colonoscopy 09/06/2013 - recc  5 yr f/u Recommended yearly ophthalmology/optometry visit for glaucoma screening and checkup Recommended yearly dental visit for hygiene and checkup Advanced directives - yes  Conditions/risks identified: BMI: Discussed weight loss, diet, and increase physical activity.  Increase physical activity: AHA recommends 150 minutes of physical activity a week.  Medications reviewed Diabetes is at goal, ACE/ARB therapy: not indicated Urinary Incontinence is an issue: discussed non pharmacology and pharmacology options.  Fall risk: moderate- discussed PT, home fall assessment, medications.   Subjective:    John [name]  presents for [employer] Visit and complete physical.  Date of last medicare wellness visit was 08/27/2014.  He has had elevated blood pressure circa 1960's. He has had a long hx/o labile BP wit dysautonomia and postural hypotension and has been weaned off of BP meds and transitioned to Florinef which seems to be controlin his postural hypotension.   His blood pressure has been controlled at home, today his BP is BP: 128/66 mmHg. In 1996, he had a 2-V CABG and has done well since.  He uses a treadmill occasionally. He denies chest pain, shortness of breath, dizziness.  He is on cholesterol medication and denies myalgias. His cholesterol is near, but  is not at goal. The cholesterol last visit was:  Lab Results  Component Value Date   CHOL 170 06/17/2015   HDL 64 06/17/2015   LDLCALC 82 06/17/2015   TRIG 120 06/17/2015   CHOLHDL 2.7 06/17/2015   He has had diabetes for 20 years since 1995. He has been working on diet and exercise for diabetes and denies foot ulcerations, hyperglycemia, hyperglycemia  , increased appetite,  nausea, paresthesia of the feet, polydipsia and visual disturbances. Last A1C in the office was:  Lab Results  Component Value Date   HGBA1C 5.5 06/17/2015   Patient is on Vitamin D supplement.   Lab Results  Component Value Date   VD25OH  53 06/17/2015     Names of Other Physician/Practitioners you currently use: 1. [facility] here for primary care 2. Dr's Hollander & Lyles, eye doctor, last visit Oct 2015 (Cat surgery)  3. Dr Cottie Banda, DDS, dentist, last visit 2016 - up to date - every 6 month  Patient Care Team: Unk Pinto, MD as PCP - General Burnell Blanks, MD as Consulting Physician (Cardiology) Inda Castle, MD as Consulting Physician (Gastroenterology) Jannette Spanner, MD as Referring Physician (Dermatology)  Medication Review: Medication Sig  . ALPRAZolam  1 MG tablet TAKE ONE TABLET BY MOUTH THREE TIMES DAILY FOR ANXIETY  . aspirin 81 MG tablet Take 81 mg by mouth daily.    [name] VITAMIN D 2000 UNITS capsule Take 6,000 Units by mouth daily.   . clopidogrel 75 MG tablet Take 1 tablet (75 mg total) by mouth daily.  . fludrocortisone0.1 MG tablet Take 1 tablet 2 x day or as directed for Low BP  . loperamide  2 MG capsule Take 2 mg by mouth as needed for diarrhea or loose stools.  . nitroGLYCERIN (NITROSTAT) 0.4 MG SL tablet Sig: 1 tablet under tongue every 3 to 5 minutes as needed for Angina - Please Dispense # 2 bottles of # 25 tabs  . pantoprazole  40 MG tablet Take 1 tablet (40 mg total) by mouth daily.  . potassium citrate (UROCIT-K) 10 MEQ (1080 MG) SR tablet Take 1 tablet (10 mEq total) by mouth 2 (two) times daily.  [name] PRED FORTE 1 % ophth susp 1 to 2 drops to affected eye 4 x day as needed for allergies  . prochlorperazine5 MG tablet Take 1-2 tablets TID PRN for nausea.  . promethazine  25 MG tablet Take 1 to 2 tablets every 4 hours as needed for nausea or vomitting  . QUEtiapine  25 MG tablet TAKE ONE TABLET BY MOUTH THREE TIMES DAILY  . simvastatin  80 MG tablet Take 1 tablet (80 mg total) by mouth at bedtime.  [name] VIGAMOX 0.5 % ophthalmic solution    Allergies  Allergen Reactions  . Beta Adrenergic Blockers Other (See Comments)    REACTION:  Bradycardia  . Gabapentin Other (See Comments)    Severe confusion  . Prozac [Fluoxetine Hcl] Other (See Comments)    Patient state that it does not agree with him  . Soma [Carisoprodol] Other (See Comments)    Patient stated that it does not agree with him   Current Problems (verified) Patient Active Problem List   Diagnosis Date Noted  . Body mass index (BMI) of 21.0-21.9 in adult 09/04/2015  . Esophageal reflux 07/09/2015  . Autonomic postural hypotension 02/06/2015  . C. difficile diarrhea 03/21/2014  . CKD (chronic kidney disease) 03/20/2014  . Medication management 02/06/2014  . Vitamin D deficiency 02/06/2014  . Gastric AV malformation 10/30/2012  . Duodenal ulcer without hemorrhage or perforation 12/29/2011  . Personal history of colonic polyps 09/14/2011  . Type II diabetes mellitus with renal manifestations ([place]) 03/12/2009  . Mixed hyperlipidemia 03/12/2009  . Essential hypertension 03/12/2009  . ASCAD/CABG 03/12/2009  . TIA 03/12/2009  . ASTHMA 03/12/2009  . COPD 03/12/2009  . GERD 03/12/2009  . SUPRAVENTRICULAR  TACHYCARDIA, HX OF 03/12/2009  . NEPHROLITHIASIS, HX OF 03/12/2009   Screening Tests Health Maintenance  Topic Date Due  . OPHTHALMOLOGY EXAM  08/16/2015  . FOOT EXAM  08/28/2015  . URINE MICROALBUMIN  08/28/2015  . HEMOGLOBIN A1C  12/18/2015  . INFLUENZA VACCINE  05/25/2016  . COLONOSCOPY  09/06/2018  . TETANUS/TDAP  08/02/2023  . ZOSTAVAX  Completed  . PNA vac Low Risk Adult  Completed   Immunization History  Administered Date(s) Administered  . Influenza, High Dose Seasonal PF 08/13/2013, 08/05/2014, 07/23/2015  . Pneumococcal Conjugate-13 08/27/2014  . Pneumococcal Polysaccharide-23 08/06/2012  . Tdap 08/01/2013  . Zoster 05/02/2006   Preventative care: Last colonoscopy: 09/06/2013  Past Medical History  Diagnosis Date  . TIA   . HYPERLIPIDEMIA   . CAD, ARTERY BYPASS GRAFT   . SUPRAVENTRICULAR TACHYCARDIA, HX OF   . Leukodystrophy    . GASTROESOPHAGEAL REFLUX DISEASE   . ASTHMA   . Diverticulosis   . Insomnia   . Anxiety   . Blood transfusion     "related to OHS, ulcers"  . Ulcer   . Colon polyps     adenomatous polyps  . COPD   . Sleep apnea     "don't always wear it" (03/20/2014)  . Arthritis     "lower back; left ankle" (03/20/2014)  . Nephrolithiasis     "I've had over 320 kidney stones" (03/20/2014)    Past Surgical History  Procedure Laterality Date  . Cardiac catheterization    . Gastrectomy    . Vagotomy    . Cystourethroscopy with stent removal.    . Irrigation and debridement sebaceous cyst      "off my back  . Colonoscopy    . Upper gastrointestinal endoscopy    . Excisional hemorrhoidectomy    . Polypectomy    . Coronary artery bypass graft  1996    CABG X2  . Prostate surgery      "took the center of my prostate out"  . Cystoscopy with ureteroscopy, stone basketry and stent placement    . Ankle fracture surgery Left ~ 2012  . Hiatal hernia repair      Risk Factors: Tobacco Social History  Substance Use Topics  . Smoking status: Former Smoker -- 0.50 packs/day    Types: Cigarettes    Quit date: 10/25/1962  . Smokeless tobacco: Former Systems developer    Types: Chew  . Alcohol Use: No   He does not smoke.  Patient is a former smoker. Are there smokers in your home (other than you)?  No  Alcohol Current alcohol use: none  Caffeine Current caffeine use: denies use  Exercise Current exercise: treadmill occasionally  Nutrition/Diet Current diet: in general, a "healthy" diet    Cardiac risk factors: advanced age (older than 54 for men, 62 for women), diabetes mellitus, dyslipidemia, hypertension, male gender, sedentary lifestyle and smoking/ tobacco exposure.  Depression Screen (Note: if answer to either of the following is "Yes", a more complete depression screening is indicated)   Q1: Over the past two weeks, have you felt down, depressed or hopeless? No  Q2: Over the past two  weeks, have you felt little interest or pleasure in doing things? No  Have you lost interest or pleasure in daily life? No  Do you often feel hopeless? No  Do you cry easily over simple problems? No  Activities of Daily Living In your present state of health, do you have any difficulty performing the following activities?:  Driving? No Managing money?  No Feeding yourself? No Getting from bed to chair? No Climbing a flight of stairs? No Preparing food and eating?: No Bathing or showering? No Getting dressed: No Getting to the toilet? No Using the toilet:No Moving around from place to place: No In the past year have you fallen or had a near fall?:Yes   Are you sexually active?  No  Do you have more than one partner?  No  Vision Difficulties: No  Hearing Difficulties: No Do you often ask people to speak up or repeat themselves? No Do you experience ringing or noises in your ears? No Do you have difficulty understanding soft or whispered voices? No  Cognition  Do you feel that you have a problem with memory?No  Do you often misplace items? No  Do you feel safe at home?  Yes  Advanced directives Does patient have a [place]? Yes Does patient have a Living Will? Yes  ROS: Constitutional: Denies fever, chills, weight loss/gain, headaches, insomnia, fatigue, night sweats or change in appetite. Eyes: Denies redness, blurred vision, diplopia, discharge, itchy or watery eyes.  ENT: Denies discharge, congestion, post nasal drip, epistaxis, sore throat, earache, hearing loss, dental pain, Tinnitus, Vertigo, Sinus pain or snoring.  Cardio: Denies chest pain, palpitations, irregular heartbeat, syncope, dyspnea, diaphoresis, orthopnea, PND, claudication or edema Respiratory: denies cough, dyspnea, DOE, pleurisy, hoarseness, laryngitis or wheezing.  Gastrointestinal: Denies dysphagia, heartburn, reflux, water brash, pain, cramps, nausea, vomiting, bloating,  diarrhea, constipation, hematemesis, melena, hematochezia, jaundice or hemorrhoids Genitourinary: Denies dysuria, frequency, urgency, nocturia, hesitancy, discharge, hematuria or flank pain Musculoskeletal: Denies arthralgia, myalgia, stiffness, Jt. Swelling, pain, limp or strain/sprain. Denies Falls. Skin: Denies puritis, rash, hives, warts, acne, eczema or change in skin lesion Neuro: No weakness, tremor, incoordination, spasms, paresthesia or pain Psychiatric: Denies confusion, memory loss or sensory loss. Denies Depression. Endocrine: Denies change in weight, skin, hair change, nocturia, and paresthesia, diabetic polys, visual blurring or hyper / hypo glycemic episodes.  Heme/Lymph: No excessive bleeding, bruising or enlarged lymph nodes.  Objective:     BP 128/66 mmHg  Pulse 78  Temp(Src) 98 F (36.7 C) (Temporal)  Resp 16  Ht 5\' 11"  (1.803 m)  Wt 155 lb (70.308 kg)  BMI 21.63 kg/m2  General Appearance:  Alert  WD/WN, male  in no apparent distress. Eyes: PERRLA, EOMs nl, conjunctiva normal, normal fundi and vessels. Sinuses: No frontal/maxillary tenderness ENT/Mouth: EACs patent / TMs  nl. Nares clear without erythema, swelling, mucoid exudates. Oral hygiene is good. No erythema, swelling, or exudate. Tongue normal, non-obstructing. Tonsils not swollen or erythematous. Hearing normal.  Neck: Supple, thyroid normal. No bruits, nodes or JVD. Respiratory: Respiratory effort normal.  BS equal and clear bilateral without rales, rhonci, wheezing or stridor. Cardio: Heart sounds are normal with regular rate and rhythm and no murmurs, rubs or gallops. Peripheral pulses are normal and equal bilaterally without edema. No aortic or femoral bruits. Chest: symmetric with normal excursions and percussion.  Abdomen: Flat, soft, with nl bowel sounds. Nontender, no guarding, rebound, hernias, masses, or organomegaly.  Lymphatics: Non tender without lymphadenopathy.  Genitourinary: No hernias.Testes  nl. DRE - prostate nl for age - smooth & firm w/o nodules. Musculoskeletal: Full ROM all peripheral extremities, joint stability, 5/5 strength, and normal gait. Skin: Warm and dry without rashes, lesions, cyanosis, clubbing or  ecchymosis.  Neuro: Cranial nerves intact, reflexes equal bilaterally. Normal muscle tone, no cerebellar symptoms. Sensation intact to touch, vibratory and Monofilament  testing bilaterally to the toes.  Pysch: Alert and oriented X 3 with normal affect, insight and judgment appropriate.   Cognitive Testing  Alert? Yes  Normal Appearance? Yes  Oriented to person? Yes  Place? Yes   Time? Yes  Recall of three objects?  Yes  Can perform simple calculations? Yes  Displays appropriate judgment? Yes  Can read the correct time from a watch/clock? Yes  Medicare Attestation I have personally reviewed: The patient's medical and social history Their use of alcohol, tobacco or illicit drugs Their current medications and supplements The patient's functional ability including ADLs,fall risks, home safety risks, cognitive, and hearing and visual impairment Diet and physical activities Evidence for depression or mood disorders  The patient's weight, height, BMI, and visual acuity have been recorded in the chart.  I have made referrals, counseling, and provided education to the patient based on review of the above and I have provided the patient with a written personalized care plan for preventive services.  Over 40 minutes of exam, counseling, chart review was performed.  Arslan Kier DAVID, MD   09/04/2015

## 2015-09-04 NOTE — Patient Instructions (Signed)

## 2015-09-05 LAB — URINALYSIS, ROUTINE W REFLEX MICROSCOPIC
BILIRUBIN URINE: NEGATIVE
Hgb urine dipstick: NEGATIVE
KETONES UR: NEGATIVE
Leukocytes, UA: NEGATIVE
Nitrite: NEGATIVE
PROTEIN: NEGATIVE
Specific Gravity, Urine: 1.024 (ref 1.001–1.035)
pH: 6 (ref 5.0–8.0)

## 2015-09-05 LAB — MICROALBUMIN / CREATININE URINE RATIO
CREATININE, URINE: 180 mg/dL (ref 20–370)
MICROALB UR: 0.6 mg/dL
MICROALB/CREAT RATIO: 3 ug/mg{creat} (ref <30)

## 2015-09-05 LAB — INSULIN, RANDOM: INSULIN: 16.4 u[IU]/mL (ref 2.0–19.6)

## 2015-09-05 LAB — VITAMIN D 25 HYDROXY (VIT D DEFICIENCY, FRACTURES): VIT D 25 HYDROXY: 59 ng/mL (ref 30–100)

## 2015-09-05 LAB — PSA: PSA: 0.34 ng/mL (ref <=4.00)

## 2015-09-10 ENCOUNTER — Encounter: Payer: Self-pay | Admitting: Internal Medicine

## 2015-09-10 ENCOUNTER — Observation Stay (HOSPITAL_COMMUNITY)
Admission: EM | Admit: 2015-09-10 | Discharge: 2015-09-11 | Disposition: A | Discharge status: Home / Self Care | Payer: PPO | Attending: Internal Medicine | Admitting: Internal Medicine

## 2015-09-10 ENCOUNTER — Encounter (HOSPITAL_COMMUNITY): Payer: Self-pay | Admitting: Cardiology

## 2015-09-10 ENCOUNTER — Emergency Department (HOSPITAL_COMMUNITY): Payer: PPO

## 2015-09-10 ENCOUNTER — Observation Stay (HOSPITAL_COMMUNITY): Payer: PPO

## 2015-09-10 ENCOUNTER — Ambulatory Visit (INDEPENDENT_AMBULATORY_CARE_PROVIDER_SITE_OTHER): Payer: PPO | Admitting: Internal Medicine

## 2015-09-10 VITALS — BP 118/66 | HR 98 | Temp 98.2°F | Resp 16

## 2015-09-10 DIAGNOSIS — N182 Chronic kidney disease, stage 2 (mild): Secondary | ICD-10-CM

## 2015-09-10 DIAGNOSIS — R4781 Slurred speech: Secondary | ICD-10-CM

## 2015-09-10 DIAGNOSIS — R278 Other lack of coordination: Secondary | ICD-10-CM | POA: Diagnosis not present

## 2015-09-10 DIAGNOSIS — Z888 Allergy status to other drugs, medicaments and biological substances status: Secondary | ICD-10-CM | POA: Insufficient documentation

## 2015-09-10 DIAGNOSIS — Z87442 Personal history of urinary calculi: Secondary | ICD-10-CM | POA: Diagnosis not present

## 2015-09-10 DIAGNOSIS — K219 Gastro-esophageal reflux disease without esophagitis: Secondary | ICD-10-CM | POA: Diagnosis not present

## 2015-09-10 DIAGNOSIS — G319 Degenerative disease of nervous system, unspecified: Secondary | ICD-10-CM

## 2015-09-10 DIAGNOSIS — Z86010 Personal history of colonic polyps: Secondary | ICD-10-CM | POA: Diagnosis not present

## 2015-09-10 DIAGNOSIS — Z87891 Personal history of nicotine dependence: Secondary | ICD-10-CM | POA: Insufficient documentation

## 2015-09-10 DIAGNOSIS — E785 Hyperlipidemia, unspecified: Secondary | ICD-10-CM | POA: Diagnosis not present

## 2015-09-10 DIAGNOSIS — G9340 Encephalopathy, unspecified: Secondary | ICD-10-CM | POA: Diagnosis not present

## 2015-09-10 DIAGNOSIS — R531 Weakness: Secondary | ICD-10-CM | POA: Diagnosis not present

## 2015-09-10 DIAGNOSIS — N1831 Chronic kidney disease, stage 3a: Secondary | ICD-10-CM | POA: Diagnosis present

## 2015-09-10 DIAGNOSIS — J45909 Unspecified asthma, uncomplicated: Secondary | ICD-10-CM | POA: Diagnosis not present

## 2015-09-10 DIAGNOSIS — Z7902 Long term (current) use of antithrombotics/antiplatelets: Secondary | ICD-10-CM | POA: Insufficient documentation

## 2015-09-10 DIAGNOSIS — R2681 Unsteadiness on feet: Secondary | ICD-10-CM | POA: Insufficient documentation

## 2015-09-10 DIAGNOSIS — Z79899 Other long term (current) drug therapy: Secondary | ICD-10-CM | POA: Diagnosis not present

## 2015-09-10 DIAGNOSIS — E1129 Type 2 diabetes mellitus with other diabetic kidney complication: Secondary | ICD-10-CM | POA: Diagnosis present

## 2015-09-10 DIAGNOSIS — Z7982 Long term (current) use of aspirin: Secondary | ICD-10-CM | POA: Insufficient documentation

## 2015-09-10 DIAGNOSIS — I2510 Atherosclerotic heart disease of native coronary artery without angina pectoris: Secondary | ICD-10-CM | POA: Insufficient documentation

## 2015-09-10 DIAGNOSIS — J449 Chronic obstructive pulmonary disease, unspecified: Secondary | ICD-10-CM | POA: Diagnosis not present

## 2015-09-10 DIAGNOSIS — I951 Orthostatic hypotension: Secondary | ICD-10-CM | POA: Insufficient documentation

## 2015-09-10 DIAGNOSIS — E1122 Type 2 diabetes mellitus with diabetic chronic kidney disease: Secondary | ICD-10-CM | POA: Insufficient documentation

## 2015-09-10 DIAGNOSIS — W19XXXA Unspecified fall, initial encounter: Secondary | ICD-10-CM | POA: Diagnosis not present

## 2015-09-10 DIAGNOSIS — Z8673 Personal history of transient ischemic attack (TIA), and cerebral infarction without residual deficits: Secondary | ICD-10-CM | POA: Insufficient documentation

## 2015-09-10 DIAGNOSIS — N183 Chronic kidney disease, stage 3 (moderate): Secondary | ICD-10-CM

## 2015-09-10 DIAGNOSIS — G20 Parkinson's disease: Secondary | ICD-10-CM | POA: Insufficient documentation

## 2015-09-10 DIAGNOSIS — R296 Repeated falls: Secondary | ICD-10-CM

## 2015-09-10 DIAGNOSIS — E876 Hypokalemia: Secondary | ICD-10-CM | POA: Diagnosis not present

## 2015-09-10 DIAGNOSIS — E119 Type 2 diabetes mellitus without complications: Secondary | ICD-10-CM | POA: Diagnosis present

## 2015-09-10 DIAGNOSIS — F0280 Dementia in other diseases classified elsewhere without behavioral disturbance: Secondary | ICD-10-CM | POA: Insufficient documentation

## 2015-09-10 LAB — DIFFERENTIAL
BASOS ABS: 0 10*3/uL (ref 0.0–0.1)
BASOS PCT: 0 %
Eosinophils Absolute: 0 10*3/uL (ref 0.0–0.7)
Eosinophils Relative: 0 %
LYMPHS PCT: 13 %
Lymphs Abs: 1 10*3/uL (ref 0.7–4.0)
MONO ABS: 0.6 10*3/uL (ref 0.1–1.0)
Monocytes Relative: 8 %
NEUTROS ABS: 6.4 10*3/uL (ref 1.7–7.7)
Neutrophils Relative %: 79 %

## 2015-09-10 LAB — GLUCOSE, CAPILLARY: Glucose-Capillary: 114 mg/dL — ABNORMAL HIGH (ref 65–99)

## 2015-09-10 LAB — CBC
HCT: 40.5 % (ref 39.0–52.0)
Hemoglobin: 13.5 g/dL (ref 13.0–17.0)
MCH: 32.1 pg (ref 26.0–34.0)
MCHC: 33.3 g/dL (ref 30.0–36.0)
MCV: 96.4 fL (ref 78.0–100.0)
PLATELETS: 166 10*3/uL (ref 150–400)
RBC: 4.2 MIL/uL — AB (ref 4.22–5.81)
RDW: 13.4 % (ref 11.5–15.5)
WBC: 8.1 10*3/uL (ref 4.0–10.5)

## 2015-09-10 LAB — COMPREHENSIVE METABOLIC PANEL
ALBUMIN: 3.7 g/dL (ref 3.5–5.0)
ALK PHOS: 65 U/L (ref 38–126)
ALT: 29 U/L (ref 17–63)
AST: 35 U/L (ref 15–41)
Anion gap: 9 (ref 5–15)
BUN: 9 mg/dL (ref 6–20)
CALCIUM: 8.9 mg/dL (ref 8.9–10.3)
CHLORIDE: 107 mmol/L (ref 101–111)
CO2: 25 mmol/L (ref 22–32)
CREATININE: 1.32 mg/dL — AB (ref 0.61–1.24)
GFR calc Af Amer: 58 mL/min — ABNORMAL LOW (ref >60)
GFR calc non Af Amer: 50 mL/min — ABNORMAL LOW (ref >60)
GLUCOSE: 121 mg/dL — AB (ref 65–99)
Potassium: 3.4 mmol/L — ABNORMAL LOW (ref 3.5–5.1)
SODIUM: 141 mmol/L (ref 135–145)
Total Bilirubin: 0.6 mg/dL (ref 0.3–1.2)
Total Protein: 6.3 g/dL — ABNORMAL LOW (ref 6.5–8.1)

## 2015-09-10 LAB — URINALYSIS, ROUTINE W REFLEX MICROSCOPIC
Bilirubin Urine: NEGATIVE
Glucose, UA: NEGATIVE mg/dL
HGB URINE DIPSTICK: NEGATIVE
Ketones, ur: NEGATIVE mg/dL
Leukocytes, UA: NEGATIVE
Nitrite: NEGATIVE
PH: 6 (ref 5.0–8.0)
Protein, ur: NEGATIVE mg/dL
SPECIFIC GRAVITY, URINE: 1.019 (ref 1.005–1.030)

## 2015-09-10 LAB — APTT: APTT: 24 s (ref 24–37)

## 2015-09-10 LAB — I-STAT CHEM 8, ED
BUN: 11 mg/dL (ref 6–20)
CALCIUM ION: 1.16 mmol/L (ref 1.13–1.30)
CHLORIDE: 103 mmol/L (ref 101–111)
CREATININE: 1.2 mg/dL (ref 0.61–1.24)
GLUCOSE: 116 mg/dL — AB (ref 65–99)
HCT: 43 % (ref 39.0–52.0)
Hemoglobin: 14.6 g/dL (ref 13.0–17.0)
POTASSIUM: 3.4 mmol/L — AB (ref 3.5–5.1)
Sodium: 143 mmol/L (ref 135–145)
TCO2: 25 mmol/L (ref 0–100)

## 2015-09-10 LAB — TSH: TSH: 1.262 u[IU]/mL (ref 0.350–4.500)

## 2015-09-10 LAB — I-STAT TROPONIN, ED: Troponin i, poc: 0.01 ng/mL (ref 0.00–0.08)

## 2015-09-10 LAB — PROTIME-INR
INR: 1.14 (ref 0.00–1.49)
PROTHROMBIN TIME: 14.8 s (ref 11.6–15.2)

## 2015-09-10 LAB — LIPID PANEL
CHOL/HDL RATIO: 2.5 ratio
CHOLESTEROL: 145 mg/dL (ref 0–200)
HDL: 57 mg/dL (ref >40)
LDL CALC: 74 mg/dL (ref 0–99)
Triglycerides: 69 mg/dL (ref <150)
VLDL: 14 mg/dL (ref 0–40)

## 2015-09-10 LAB — MAGNESIUM: Magnesium: 1.8 mg/dL (ref 1.7–2.4)

## 2015-09-10 LAB — VITAMIN B12: Vitamin B-12: 408 pg/mL (ref 180–914)

## 2015-09-10 MED ORDER — ASPIRIN 81 MG PO CHEW
81.0000 mg | CHEWABLE_TABLET | Freq: Every day | ORAL | Status: DC
  Administered 2015-09-11: 81 mg via ORAL
  Filled 2015-09-10 (×2): qty 1

## 2015-09-10 MED ORDER — GATIFLOXACIN 0.5 % OP SOLN
1.0000 [drp] | Freq: Four times a day (QID) | OPHTHALMIC | Status: DC
  Administered 2015-09-10 – 2015-09-11 (×4): 1 [drp] via OPHTHALMIC
  Filled 2015-09-10: qty 2.5

## 2015-09-10 MED ORDER — VITAMIN D3 50 MCG (2000 UT) PO CAPS
8000.0000 [IU] | ORAL_CAPSULE | Freq: Every day | ORAL | Status: DC
  Filled 2015-09-10: qty 4

## 2015-09-10 MED ORDER — ALPRAZOLAM 0.5 MG PO TABS
0.5000 mg | ORAL_TABLET | Freq: Every evening | ORAL | Status: DC | PRN
  Administered 2015-09-10: 0.5 mg via ORAL
  Filled 2015-09-10: qty 1

## 2015-09-10 MED ORDER — POTASSIUM CITRATE ER 10 MEQ (1080 MG) PO TBCR
10.0000 meq | EXTENDED_RELEASE_TABLET | Freq: Two times a day (BID) | ORAL | Status: DC
  Administered 2015-09-10 – 2015-09-11 (×2): 10 meq via ORAL
  Filled 2015-09-10 (×3): qty 1

## 2015-09-10 MED ORDER — ATORVASTATIN CALCIUM 40 MG PO TABS: 40.0000 mg | ORAL_TABLET | Freq: Every day | ORAL | Status: DC

## 2015-09-10 MED ORDER — AMANTADINE HCL 100 MG PO CAPS
100.0000 mg | ORAL_CAPSULE | Freq: Two times a day (BID) | ORAL | Status: DC
Start: 2015-09-10 — End: 2015-09-11
  Administered 2015-09-10 – 2015-09-11 (×3): 100 mg via ORAL
  Filled 2015-09-10 (×5): qty 1

## 2015-09-10 MED ORDER — LORAZEPAM 2 MG/ML IJ SOLN
0.5000 mg | Freq: Once | INTRAMUSCULAR | Status: AC
  Administered 2015-09-10: 0.5 mg via INTRAVENOUS
  Filled 2015-09-10: qty 1

## 2015-09-10 MED ORDER — FLUDROCORTISONE ACETATE 0.1 MG PO TABS
0.1000 mg | ORAL_TABLET | Freq: Two times a day (BID) | ORAL | Status: DC
  Administered 2015-09-11: 0.1 mg via ORAL
  Filled 2015-09-10 (×2): qty 1

## 2015-09-10 MED ORDER — VITAMIN D 1000 UNITS PO TABS
8000.0000 [IU] | ORAL_TABLET | Freq: Every day | ORAL | Status: DC
  Administered 2015-09-10 – 2015-09-11 (×2): 8000 [IU] via ORAL
  Filled 2015-09-10 (×2): qty 8

## 2015-09-10 MED ORDER — ONDANSETRON HCL 4 MG/2ML IJ SOLN
4.0000 mg | Freq: Four times a day (QID) | INTRAMUSCULAR | Status: DC | PRN
Start: 2015-09-10 — End: 2015-09-11

## 2015-09-10 MED ORDER — POTASSIUM CHLORIDE CRYS ER 20 MEQ PO TBCR: 40.0000 meq | EXTENDED_RELEASE_TABLET | Freq: Once | ORAL | Status: DC

## 2015-09-10 MED ORDER — SODIUM CHLORIDE 0.9 % IV SOLN: INTRAVENOUS | Status: AC

## 2015-09-10 MED ORDER — CLOPIDOGREL BISULFATE 75 MG PO TABS
75.0000 mg | ORAL_TABLET | Freq: Every day | ORAL | Status: DC
  Administered 2015-09-11: 75 mg via ORAL
  Filled 2015-09-10: qty 1

## 2015-09-10 MED ORDER — ACETAMINOPHEN 325 MG PO TABS: 650.0000 mg | ORAL_TABLET | Freq: Four times a day (QID) | ORAL | Status: DC | PRN

## 2015-09-10 MED ORDER — PROCHLORPERAZINE MALEATE 5 MG PO TABS
5.0000 mg | ORAL_TABLET | Freq: Three times a day (TID) | ORAL | Status: DC | PRN
  Filled 2015-09-10: qty 1

## 2015-09-10 MED ORDER — PANTOPRAZOLE SODIUM 40 MG PO TBEC
40.0000 mg | DELAYED_RELEASE_TABLET | Freq: Every day | ORAL | Status: DC
  Administered 2015-09-11: 40 mg via ORAL
  Filled 2015-09-10: qty 1

## 2015-09-10 MED ORDER — ACETAMINOPHEN 650 MG RE SUPP: 650.0000 mg | Freq: Four times a day (QID) | RECTAL | Status: DC | PRN

## 2015-09-10 MED ORDER — ONDANSETRON HCL 4 MG PO TABS: 4.0000 mg | ORAL_TABLET | Freq: Four times a day (QID) | ORAL | Status: DC | PRN

## 2015-09-10 NOTE — Progress Notes (Signed)
Subjective:    Patient ID: John [name], male    DOB: [birth year], 80 y.o.   MRN: [id number]  HPI  Patient presents to the office for evaluation of 3 falls in the last 24 hours.  He is here today with his daughter and his wife.  His daughter reports per her mother that he fell out of the bed last night.  He reports that he stayed on the floor from 3 am until 9 am when his son came to get him. He reports that he thinks he was asleep when he fell out of the bed. He reports that he was unable to get himself off the floor but his legs were unable to get him up.  Once he got up he fell again when he got up to go to the bathroom. He reports that he wasn't sure that he was going to fall.  He reports that he is never sure whether he is going to fall.  He is unstable all day long per his daughters report.  He is also taking melatonin, seroquel, and xanax together for sleep at night time.  He reports that he did not have any lightheaded ness or dizziness prior to hany of these falls.  He hasn't had any headaches or blurry vision.  His daughter reports that his speech has seemed slightly different since Sunday morning.  He repeated a different story to Dr. Melford Aase when he entered the room. He does report that he feels very washed out.    Review of Systems  Constitutional: Positive for activity change and fatigue. Negative for fever and chills.  Respiratory: Negative for chest tightness and shortness of breath.   Cardiovascular: Negative for chest pain and palpitations.  Genitourinary: Negative.   Musculoskeletal: Positive for gait problem.  Neurological: Positive for weakness. Negative for dizziness, speech difficulty, light-headedness, numbness and headaches.       Objective:   Physical Exam  Constitutional: He appears well-developed and well-nourished. No distress.  HENT:  Head: Normocephalic and atraumatic.  Mouth/Throat: Oropharynx is clear and moist. No oropharyngeal exudate.  No visible  hematomas to the head, no tenderness to palpation.  Eyes: EOM are normal. Pupils are equal, round, and reactive to light. No scleral icterus.  Mild left conjunctivitis  Neck: Normal range of motion. Neck supple. No JVD present. No thyromegaly present.  Cardiovascular: Normal rate, regular rhythm, normal heart sounds and intact distal pulses.  Exam reveals no gallop and no friction rub.   No murmur heard. Pulmonary/Chest: Effort normal and breath sounds normal. No respiratory distress. He has no wheezes. He has no rales. He exhibits no tenderness.  Abdominal: Soft. Bowel sounds are normal. He exhibits no distension and no mass. There is no tenderness. There is no rebound and no guarding.  Musculoskeletal: Normal range of motion.  Lymphadenopathy:    He has no cervical adenopathy.  Neurological: He is alert. He displays tremor. 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 No cranial nerve deficit.  Tremor to bilateral hands.  Alternating hand movements intact. Difficulty with heel to shin bilaterally.  Significant overshoot of rightfinger to nose.  Speech mildly slurred. Inability to recall Dr. Jacelyn Pi name despite seeing him for greater than 10 years. Did not check for ataxia given safety concerns.   Skin: Skin is warm and dry. He is not diaphoretic.  Superficial laceration to the right forearm.  No active bleeding.  Good distal right radial pulse  Psychiatric: He has a normal mood  and affect. His behavior is normal. Cognition and memory are impaired. He expresses inappropriate judgment. He exhibits abnormal recent memory.  Speech mildly slurred.  Felt it was appropriate to walk after having 3 falls. Hard to keep on topic. Started coughing when asked to do deep breathing.   Nursing note and vitals reviewed.         Assessment & Plan:   Patient presents to the office for multiple falls and fatigue.  Physical exam with concerning cerebellar symptoms.  Given poor history feel  that sending home and outpatient evaluation not safe.  Ddx includes stroke, TIA, subdural hematoma, medication interaction, and possible seizures.  Will send to ER for further eval as disposition home is unsafe at this time.  Patient also seen by and evaluated by Dr. Melford Aase who agrees that patient does need further eval at ER.

## 2015-09-10 NOTE — H&P (Signed)
[facility] History and Physical  NITESH KRETZER [id number] DOB: 03/22/1935 DOA: 09/10/2015  Referring physician: [email or web address] PCP: Alesia Richards, MD   Chief Complaint: falls  HPI: John [name] is a 80 y.o. male lowered with a past medical history of TIA, hyperlipidemia, CAD, orthostatic hypertension, diabetes, COPD presents emergency department from his PCPs office with the chief complaint 3 falls in the last 24 hours. Initial evaluation in the emergency department reveals an abnormal head CT, unsteady gait and evaluation by neuro who opines newly diagnosed Parkinson's.  Information is obtained from the family who is at the bedside. Wife says her fall was really him rolling out of bed in his sleep. He was awakened and unable to get up off the floor. He was assisted to his feet and placed in the chair. Shortly thereafter he fell while trying to get out of the chair. Third fall was in the bathroom brushing  his teeth. Associated symptoms include intermittent mildly slurred speech and confusion. He denies any episodes of dizziness chest pain palpitations syncope or near-syncope. He remained alert and oriented during all these episodes. There is no shortness of breath diaphoresis abdominal pain nausea vomiting or diarrhea. He states that his legs just feel "jerky". His baseline is ambulating without any assistance. No recent travel or sick contacts.  Workup in the emergency department includes a CT of the brain showing basal ganglia degenerative changes, complete metabolic panel significant for potassium of 3.4 creatinine 1.3 serum glucose of 121, CBC unremarkable, PT and INR unremarkable initial troponin 0.01. EKG with normal sinus rhythm.  While in the emergency department he is afebrile hemodynamically stable and not hypoxic   Review of Systems:  10 point review of systems complete and all systems are negative except as indicated in the history of present illness Past  Medical History  Diagnosis Date  . TIA   . HYPERLIPIDEMIA   . CAD, ARTERY BYPASS GRAFT   . SUPRAVENTRICULAR TACHYCARDIA, HX OF   . Leukodystrophy   . GASTROESOPHAGEAL REFLUX DISEASE   . ASTHMA   . Diverticulosis   . Insomnia   . Anxiety   . Blood transfusion     "related to OHS, ulcers"  . Ulcer   . Colon polyps     adenomatous polyps  . COPD   . Sleep apnea     "don't always wear it" (03/20/2014)  . Arthritis     "lower back; left ankle" (03/20/2014)  . Nephrolithiasis     "I've had over 320 kidney stones" (03/20/2014)   Past Surgical History  Procedure Laterality Date  . Cardiac catheterization    . Gastrectomy    . Vagotomy    . Cystourethroscopy with stent removal.    . Irrigation and debridement sebaceous cyst      "off my back  . Colonoscopy    . Upper gastrointestinal endoscopy    . Excisional hemorrhoidectomy    . Polypectomy    . Coronary artery bypass graft  1996    CABG X2  . Prostate surgery      "took the center of my prostate out"  . Cystoscopy with ureteroscopy, stone basketry and stent placement    . Ankle fracture surgery Left ~ 2012  . Hiatal hernia repair     Social History:  reports that he quit smoking about 52 years ago. His smoking use included Cigarettes. He smoked 0.50 packs per day. He has quit using smokeless tobacco. His smokeless tobacco use included Chew. He reports  that he does not drink alcohol or use illicit drugs. 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 Beta Adrenergic Blockers Other (See Comments)    REACTION: Bradycardia  . Gabapentin Other (See Comments)    Severe confusion  . Prozac [Fluoxetine Hcl] Other (See Comments)    Patient state that it does not agree with him  . Soma [Carisoprodol] Other (See Comments)    Patient stated that it does not agree with him    Family History  Problem Relation Age of Onset  . Heart disease Brother   . Diabetes  Brother   . Alcohol abuse Father   . Heart disease Paternal Grandmother   . Colon cancer Neg Hx   . Stomach cancer Neg Hx      Prior to Admission medications   Medication Sig Start Date End Date Taking? Authorizing Provider  ALPRAZolam Duanne Moron) 1 MG tablet TAKE ONE TABLET BY MOUTH THREE TIMES DAILY FOR ANXIETY 12/30/14  Yes Unk Pinto, MD  aspirin 81 MG tablet Take 81 mg by mouth daily.     Yes Historical Provider, MD  Cholecalciferol (VITAMIN D3) 2000 UNITS capsule Take 8,000 Units by mouth daily.    Yes Historical Provider, MD  clopidogrel (PLAVIX) 75 MG tablet Take 1 tablet (75 mg total) by mouth daily. 03/11/15  Yes Unk Pinto, MD  fludrocortisone (FLORINEF) 0.1 MG tablet Take 1 tablet 2 x day or as directed for Low BP Patient taking differently: Take 0.1 mg by mouth 2 (two) times daily.  07/04/15 01/01/16 Yes Unk Pinto, MD  FREESTYLE LITE test strip CHECK BLOOD SUGAR 2 TIMES A DAY 11/27/14  Yes Unk Pinto, MD  Lancets (FREESTYLE) lancets 1 each by Other route as needed for other. Use as instructed   Yes Historical Provider, MD  loperamide (IMODIUM) 2 MG capsule Take 2 mg by mouth as needed for diarrhea or loose stools.   Yes Historical Provider, MD  Melatonin 5 MG CAPS Take 5 mg by mouth at bedtime.   Yes Historical Provider, MD  nitroGLYCERIN (NITROSTAT) 0.4 MG SL tablet Sig: 1 tablet under tongue every 3 to 5 minutes as needed for Angina - Please Dispense # 2 bottles of # 25 tabs 11/08/13  Yes Unk Pinto, MD  pantoprazole (PROTONIX) 40 MG tablet Take 1 tablet (40 mg total) by mouth daily. 02/10/15 02/10/16 Yes Unk Pinto, MD  potassium citrate (UROCIT-K) 10 MEQ (1080 MG) SR tablet Take 1 tablet (10 mEq total) by mouth 2 (two) times daily. 02/10/15  Yes Unk Pinto, MD  prednisoLONE acetate (PRED FORTE) 1 % ophthalmic suspension 1 to 2 drops to affected eye 4 x day as needed for allergies Patient taking differently: Place 1 drop into both eyes every 4 (four) hours as  needed (allergies).  03/04/15 03/03/16 Yes Unk Pinto, MD  prochlorperazine (COMPAZINE) 5 MG tablet Take 1-2 tablets TID PRN for nausea. Patient taking differently: Take 5 mg by mouth every 8 (eight) hours as needed for nausea.  07/09/14  Yes Unk Pinto, MD  promethazine (PHENERGAN) 25 MG tablet Take 1 to 2 tablets every 4 hours as needed for nausea or vomitting 04/02/14  Yes Unk Pinto, MD  QUEtiapine (SEROQUEL) 25 MG tablet TAKE ONE TABLET BY MOUTH THREE TIMES DAILY Patient taking differently: TAKE ONE TABLET BY MOUTH AT BEDTIME 02/13/15  Yes Unk Pinto, MD  simvastatin (ZOCOR) 80 MG tablet Take 1 tablet (80 mg total) by mouth at  bedtime. 03/11/15  Yes Unk Pinto, MD  VIGAMOX 0.5 % ophthalmic solution Place 1 drop into both eyes daily as needed (INFECTION(S)).  08/16/14  Yes Historical Provider, MD   Physical Exam: Filed Vitals:   09/10/15 1400 09/10/15 1515 09/10/15 1530 09/10/15 1615  BP: 152/74 134/73 133/75 156/79  Pulse: 88 74 73 71  Temp:      TempSrc:      Resp: 15 16 16 15   Weight:      SpO2: 98% 98% 98% 98%    Wt Readings from Last 3 Encounters:  09/10/15 70.308 kg (155 lb)  09/04/15 70.308 kg (155 lb)  07/23/15 70.126 kg (154 lb 9.6 oz)    General:  Appears calm and comfortable Eyes: PERRL, normal lids, irises & conjunctiva ENT: grossly normal hearing, lips & tongue, mucous membranes of his mouth are pink slightly dry Neck: no LAD, masses or thyromegaly Cardiovascular: RRR, no m/r/g. No LE edema. Pulses present and palpable Telemetry: SR, no arrhythmias  Respiratory: CTA bilaterally, no w/r/r. Normal respiratory effort. Abdomen: soft, ntnd Skin: no rash or induration seen on limited exam Musculoskeletal: grossly normal tone BUE/BLE bilateral grip 5 out of 5 lower externally strength 5 out of 5, is oriented to person place and time. Psychiatric: grossly normal mood and affect, speech fluent and appropriate Neurologic: grossly non-focal. oriented to  person place and time. Bilateral grip 5 out of 5. Some word searching           Labs on Admission:  Basic Metabolic Panel:  Recent Labs Lab 09/04/15 1500 09/10/15 1327 09/10/15 1332  NA 144 141 143  K 4.0 3.4* 3.4*  CL 105 107 103  CO2 32* 25  --   GLUCOSE 122* 121* 116*  BUN 16 9 11   CREATININE 1.14 1.32* 1.20  CALCIUM 8.7 8.9  --   MG 2.0  --   --    Liver Function Tests:  Recent Labs Lab 09/04/15 1500 09/10/15 1327  AST 17 35  ALT 17 29  ALKPHOS 60 65  BILITOT 0.5 0.6  PROT 6.1 6.3*  ALBUMIN 3.6 3.7   No results for input(s): LIPASE, AMYLASE in the last 168 hours. No results for input(s): AMMONIA in the last 168 hours. CBC:  Recent Labs Lab 09/04/15 1500 09/10/15 1327 09/10/15 1332  WBC 4.2 8.1  --   NEUTROABS 1.8 6.4  --   HGB 14.1 13.5 14.6  HCT 40.8 40.5 43.0  MCV 94.7 96.4  --   PLT 181 166  --    Cardiac Enzymes: No results for input(s): CKTOTAL, CKMB, CKMBINDEX, TROPONINI in the last 168 hours.  BNP (last 3 results) No results for input(s): BNP in the last 8760 hours.  ProBNP (last 3 results) No results for input(s): PROBNP in the last 8760 hours.  CBG: No results for input(s): GLUCAP in the last 168 hours.  Radiological Exams on Admission: Ct Head Wo Contrast  09/10/2015  CLINICAL DATA:  Multiple recent falls with head injury and loss of consciousness. History of stroke and leukodystrophy. Initial encounter. EXAM: CT HEAD WITHOUT CONTRAST TECHNIQUE: Contiguous axial images were obtained from the base of the skull through the vertex without intravenous contrast. COMPARISON:  Head CT 03/20/2014 and 10/15/2010. FINDINGS: There is no evidence of acute intracranial hemorrhage, mass lesion, brain edema or extra-axial fluid collection. The ventricles and subarachnoid spaces are appropriately sized for age. There is no CT evidence of acute cortical infarction. There are stable chronic hypodensities within the periventricular white  matter and basal  ganglia bilaterally. The visualized paranasal sinuses, mastoid air cells and middle ears are clear. The calvarium is intact. IMPRESSION: Stable examination with stable periventricular white matter disease and hypodensities in the basal ganglia. No acute posttraumatic findings. Electronically Signed   By: Richardean Sale M.D.   On: 09/10/2015 15:15    EKG: Independently reviewed as noted above Assessment/Plan Principal Problem:   Encephalopathy Active Problems:   Type II diabetes mellitus with renal manifestations (HCC)   CKD (chronic kidney disease)   Frequent falls   Unsteady gait   Hypokalemia  #1. Acute encephalopathy. Mild. Etiology uncertain. No apparent metabolic derangement. No signs symptoms of infectious process. CT abnormal as noted above. Evaluated by neurology who opined newly diagnosed Parkinson's and dementia. Takes xanax and seroquel at bedtime. -Await results of MRI of the brain -We'll obtain a folate B-12 and RPR. -We'll obtain a lipid panel and hemoglobin A1c  #2. Frequent falls/unsteady gait. No loss of consciousness no injury. Dilated by neurology who opined Parkinson's and dementia. -Mild the brain as noted above. -amantadine per neuro -history of orthostatic hypotension. Will check. Continue home meds -PT evaluation.   #3. Chronic kidney disease stage II. - Appears stable at baseline.  -monitor urine output  #4. Hypokalemia.Mild.  -Will replete and recheck -check mag level  #5. Diabetes. Diet controlled. -We'll obtain a hemoglobin A1c -We'll monitor CBG -Carb modified diet      Code Status: full DVT Prophylaxis: Family Communication: wife and daughter Disposition Plan: home hopefully 24 hours. May need HH  Time spent: 65 minutes  [facility]

## 2015-09-10 NOTE — ED Notes (Signed)
Pt sent here from PCP office after having 3 falls this morning. Here to rule out stroke symptoms. Reports generalized weakness over the past couple of days.

## 2015-09-10 NOTE — Consult Note (Signed)
NEURO HOSPITALIST CONSULT NOTE   Requestig physician: Dr. Maryan Rued   Reason for Consult: Repeated falls, cognitive impairment   HPI:                                                                                                                                          John [name] is an 80 y.o. male who was brought in by his wife and daughter to the emergency room due to repeated falls. he fell about 3 times in the past 24 hours. Prior to that he's been having recurrent falls over the past several weeks. They also noticed gradual decline in his cognitive status over past several months. He continues to be independent with his ADLs but they have noticed significant decline in terms of his functioning is been mumbling muscles time with unclear speech. Patient denies any focal neurological symptoms no new vision problems he denies any trouble speaking. Sentences are understanding people no focal weakness in upper or lower extremities he does have significant gait difficulty and balance problems high family and patient is also noticed some tremors in his limbs past few weeks. Denies any bowel or bladder problems or incontinence.  Past Medical History  Diagnosis Date  . TIA   . HYPERLIPIDEMIA   . CAD, ARTERY BYPASS GRAFT   . SUPRAVENTRICULAR TACHYCARDIA, HX OF   . Leukodystrophy   . GASTROESOPHAGEAL REFLUX DISEASE   . ASTHMA   . Diverticulosis   . Insomnia   . Anxiety   . Blood transfusion     "related to OHS, ulcers"  . Ulcer   . Colon polyps     adenomatous polyps  . COPD   . Sleep apnea     "don't always wear it" (03/20/2014)  . Arthritis     "lower back; left ankle" (03/20/2014)  . Nephrolithiasis     "I've had over 320 kidney stones" (03/20/2014)    Past Surgical History  Procedure Laterality Date  . Cardiac catheterization    . Gastrectomy    . Vagotomy    . Cystourethroscopy with stent removal.    . Irrigation and debridement sebaceous cyst       "off my back  . Colonoscopy    . Upper gastrointestinal endoscopy    . Excisional hemorrhoidectomy    . Polypectomy    . Coronary artery bypass graft  1996    CABG X2  . Prostate surgery      "took the center of my prostate out"  . Cystoscopy with ureteroscopy, stone basketry and stent placement    . Ankle fracture surgery Left ~ 2012  . Hiatal hernia repair      Family History  Problem Relation Age of Onset  . Heart disease Brother   . Diabetes Brother   .  Alcohol abuse Father   . Heart disease Paternal Grandmother   . Colon cancer Neg Hx   . Stomach cancer Neg Hx     Family History: Denies any family history of neurological problems , there is history of coronary artery disease, hypertension  Social History:  reports that he quit smoking about 52 years ago. His smoking use included Cigarettes. He smoked 0.50 packs per day. He has quit using smokeless tobacco. His smokeless tobacco use included Chew. He reports that he does not drink alcohol or use illicit drugs.  Allergies  Allergen Reactions  . Beta Adrenergic Blockers Other (See Comments)    REACTION: Bradycardia  . Gabapentin Other (See Comments)    Severe confusion  . Prozac [Fluoxetine Hcl] Other (See Comments)    Patient state that it does not agree with him  . Soma [Carisoprodol] Other (See Comments)    Patient stated that it does not agree with him    MEDICATIONS:                                                                                                                     I have reviewed the patient's current medications. Current Facility-Administered Medications  Medication Dose Route Frequency Provider Last Rate Last Dose  . amantadine (SYMMETREL) capsule 100 mg  100 mg Oral BID Chastin Garlitz Fuller Mandril, MD   100 mg at 09/10/15 1758   Current Outpatient Prescriptions  Medication Sig Dispense Refill  . ALPRAZolam (XANAX) 1 MG tablet TAKE ONE TABLET BY MOUTH THREE TIMES DAILY FOR ANXIETY 270 tablet 1   . aspirin 81 MG tablet Take 81 mg by mouth daily.      . Cholecalciferol (VITAMIN D3) 2000 UNITS capsule Take 8,000 Units by mouth daily.     . clopidogrel (PLAVIX) 75 MG tablet Take 1 tablet (75 mg total) by mouth daily. 90 tablet 3  . fludrocortisone (FLORINEF) 0.1 MG tablet Take 1 tablet 2 x day or as directed for Low BP (Patient taking differently: Take 0.1 mg by mouth 2 (two) times daily. ) 60 tablet 5  . FREESTYLE LITE test strip CHECK BLOOD SUGAR 2 TIMES A DAY 150 each PRN  . Lancets (FREESTYLE) lancets 1 each by Other route as needed for other. Use as instructed    . loperamide (IMODIUM) 2 MG capsule Take 2 mg by mouth as needed for diarrhea or loose stools.    . Melatonin 5 MG CAPS Take 5 mg by mouth at bedtime.    . nitroGLYCERIN (NITROSTAT) 0.4 MG SL tablet Sig: 1 tablet under tongue every 3 to 5 minutes as needed for Angina - Please Dispense # 2 bottles of # 25 tabs 50 tablet 99  . pantoprazole (PROTONIX) 40 MG tablet Take 1 tablet (40 mg total) by mouth daily. 90 tablet 3  . potassium citrate (UROCIT-K) 10 MEQ (1080 MG) SR tablet Take 1 tablet (10 mEq total) by mouth 2 (two) times daily. 180 tablet 3  . prednisoLONE acetate (PRED  FORTE) 1 % ophthalmic suspension 1 to 2 drops to affected eye 4 x day as needed for allergies (Patient taking differently: Place 1 drop into both eyes every 4 (four) hours as needed (allergies). ) 15 mL 1  . prochlorperazine (COMPAZINE) 5 MG tablet Take 1-2 tablets TID PRN for nausea. (Patient taking differently: Take 5 mg by mouth every 8 (eight) hours as needed for nausea. ) 50 tablet 0  . promethazine (PHENERGAN) 25 MG tablet Take 1 to 2 tablets every 4 hours as needed for nausea or vomitting 100 tablet 99  . QUEtiapine (SEROQUEL) 25 MG tablet TAKE ONE TABLET BY MOUTH THREE TIMES DAILY (Patient taking differently: TAKE ONE TABLET BY MOUTH AT BEDTIME) 90 tablet 5  . simvastatin (ZOCOR) 80 MG tablet Take 1 tablet (80 mg total) by mouth at bedtime. 90 tablet 3   . VIGAMOX 0.5 % ophthalmic solution Place 1 drop into both eyes daily as needed (INFECTION(S)).   0     ROS:                                                                                                                                       History obtained from the patient  General ROS: negative for - chills, fatigue, fever, night sweats, weight gain or weight loss Psychological ROS: negative for - behavioral disorder, hallucinations, memory difficulties, mood swings or suicidal ideation Ophthalmic ROS: negative for - blurry vision, double vision, eye pain or loss of vision ENT ROS: negative for - epistaxis, nasal discharge, oral lesions, sore throat, tinnitus or vertigo Allergy and Immunology ROS: negative for - hives or itchy/watery eyes Hematological and Lymphatic ROS: negative for - bleeding problems, bruising or swollen lymph nodes Endocrine ROS: negative for - galactorrhea, hair pattern changes, polydipsia/polyuria or temperature intolerance Respiratory ROS: negative for - cough, hemoptysis, shortness of breath or wheezing Cardiovascular ROS: negative for - chest pain, dyspnea on exertion, edema or irregular heartbeat Gastrointestinal ROS: negative for - abdominal pain, diarrhea, hematemesis, nausea/vomiting or stool incontinence Genito-Urinary ROS: negative for - dysuria, hematuria, incontinence or urinary frequency/urgency Musculoskeletal ROS: negative for - joint swelling or muscular weakness Neurological ROS: as noted in HPI Dermatological ROS: negative for rash and skin lesion changes   Blood pressure 156/79, pulse 71, temperature 98.2 F (36.8 C), temperature source Oral, resp. rate 15, weight 70.308 kg (155 lb), SpO2 98 %.   Neurologic Examination:  HEENT-  Normocephalic, no lesions, without obvious abnormality.  Normal external eye and conjunctiva.    Neurological  Examination Mental Status: Alert, oriented, thought content appropriate.  Speech fluent, but moderate hypophonia noted without evidence of aphasia.  Able to follow 3 step commands without difficulty. Memory recall is 0 out of 3. Had moderate difficulty performing simple calculations. Cranial Nerves: II:  Visual fields grossly normal, pupils equal, round, reactive to light and accommodation III,IV, VI: ptosis not present, extra-ocular motions intact bilaterally V,VII: smile symmetric, facial light touch sensation normal bilaterally VIII: hearing normal bilaterally IX,X: uvula rises symmetrically XI: bilateral shoulder shrug XII: midline tongue extension Motor: Right : Upper extremity   5/5    Left:     Upper extremity   5/5  Lower extremity   5/5     Lower extremity   5/5 Mild rigidity on the right, moderate rigidity in the left upper and lower extremity is seen. Is also cogwheeling in elbows and wrists seen bilaterally, worse on the left side.  Sensory: Pinprick and light touch intact throughout, bilaterally Deep Tendon Reflexes: 2+ and symmetric throughout Plantars: Right: downgoing   Left: downgoing Resting tremor in the fingers is seen bilaterally. There is also a postural re-emergent tremor.  Cerebellar: normal finger-to-nose, Gait: He has a stooped posture, no arm swing on the left, severe truncal rigidity with retropulsion and small steps with moderate to severe gait ataxia is noted.      Lab Results: Basic Metabolic Panel:  Recent Labs Lab 09/04/15 1500 09/10/15 1327 09/10/15 1332  NA 144 141 143  K 4.0 3.4* 3.4*  CL 105 107 103  CO2 32* 25  --   GLUCOSE 122* 121* 116*  BUN 16 9 11   CREATININE 1.14 1.32* 1.20  CALCIUM 8.7 8.9  --   MG 2.0  --   --     Liver Function Tests:  Recent Labs Lab 09/04/15 1500 09/10/15 1327  AST 17 35  ALT 17 29  ALKPHOS 60 65  BILITOT 0.5 0.6  PROT 6.1 6.3*  ALBUMIN 3.6 3.7   No results for input(s): LIPASE, AMYLASE in the  last 168 hours. No results for input(s): AMMONIA in the last 168 hours.  CBC:  Recent Labs Lab 09/04/15 1500 09/10/15 1327 09/10/15 1332  WBC 4.2 8.1  --   NEUTROABS 1.8 6.4  --   HGB 14.1 13.5 14.6  HCT 40.8 40.5 43.0  MCV 94.7 96.4  --   PLT 181 166  --     Cardiac Enzymes: No results for input(s): CKTOTAL, CKMB, CKMBINDEX, TROPONINI in the last 168 hours.  Lipid Panel:  Recent Labs Lab 09/04/15 1500  CHOL 142  TRIG 65  HDL 64  CHOLHDL 2.2  VLDL 13  LDLCALC 65    CBG: No results for input(s): GLUCAP in the last 168 hours.  Microbiology: Results for orders placed or performed in visit on 09/23/14  Urine culture     Status: None   Collection Time: 09/23/14  2:15 PM  Result Value Ref Range Status   Colony Count NO GROWTH  Final   Organism ID, Bacteria NO GROWTH  Final    Coagulation Studies:  Recent Labs  09/10/15 1327  LABPROT 14.8  INR 1.14    Imaging: Ct Head Wo Contrast  09/10/2015  CLINICAL DATA:  Multiple recent falls with head injury and loss of consciousness. History of stroke and leukodystrophy. Initial encounter. EXAM: CT HEAD WITHOUT CONTRAST TECHNIQUE: Contiguous axial images were  obtained from the base of the skull through the vertex without intravenous contrast. COMPARISON:  Head CT 03/20/2014 and 10/15/2010. FINDINGS: There is no evidence of acute intracranial hemorrhage, mass lesion, brain edema or extra-axial fluid collection. The ventricles and subarachnoid spaces are appropriately sized for age. There is no CT evidence of acute cortical infarction. There are stable chronic hypodensities within the periventricular white matter and basal ganglia bilaterally. The visualized paranasal sinuses, mastoid air cells and middle ears are clear. The calvarium is intact. IMPRESSION: Stable examination with stable periventricular white matter disease and hypodensities in the basal ganglia. No acute posttraumatic findings. Electronically Signed   By:  Richardean Sale M.D.   On: 09/10/2015 15:15   Mr Brain Wo Contrast  09/10/2015  CLINICAL DATA:  Multiple falls today. Rule out stroke. Generalized weakness. Neuro degenerative disorder. EXAM: MRI HEAD WITHOUT CONTRAST TECHNIQUE: Multiplanar, multiecho pulse sequences of the brain and surrounding structures were obtained without intravenous contrast. COMPARISON:  CT head 09/10/2015 FINDINGS: Negative for acute infarct. Moderate atrophy. Moderate chronic microvascular ischemic changes in the white matter and pons. Hyperintensity in the globus pallidus bilaterally corresponds to the CT hypodensity. This is symmetric and most likely related to a benign process such as perivascular spaces or mineral deposition or degenerative change. Negative for intracranial hemorrhage or fluid collection Negative for mass or edema.  No shift of the midline structures Paranasal sinuses are clear.  Negative for orbital mass. IMPRESSION: Atrophy and chronic microvascular ischemia.  No acute abnormality. Electronically Signed   By: Franchot Gallo M.D.   On: 09/10/2015 17:33     Assessment/Plan: 81 year old male patient brought into the ER due to worsening frequency of falls as described above. His neurological examination is suggestive of Parkinson's disease. He also appears to have mild-to-moderate cognitive impairment. Recommended obtaining a brain MRI for further neurodiagnostic evaluation.  Bilateral basal ganglia degenerative changes were seen  on the CT and MRI of the brain. No other acute intracranial pathology is noted on the brain MRI study.   For management of Parkinson symptoms, recommend starting amantadine 100 mg twice a day after breakfast and after lunch. Patient will have physical therapy evaluation during hospitalization. He will benefit from a discharge to either an acute rehabilitation versus skilled nursing facility for further gait training and falls prevention. Discussed his assessment in detail with the  patient, and his wife and daughter at bedside , answered several of the questions to their satisfaction.  Neurology service will continue to follow up.  please call for any further questions

## 2015-09-10 NOTE — ED Provider Notes (Signed)
CSN: [id number]     Arrival date & time 09/10/15  1308 History   First MD Initiated Contact with Patient 09/10/15 1351     Chief Complaint  Patient presents with  . Fall  . Weakness     (Consider location/radiation/quality/duration/timing/severity/associated sxs/prior Treatment) HPI Comments: Patient is a 80 year old male with a history of TIA, hyperlipidemia, coronary artery disease, orthostatic hypotension requiring Florinef who presents today from his PCP office for recurrent falls. He had 3 falls today on which he was awake and alert during. He denies any episodes of syncope today. No dizziness, palpitations, chest pain or shortness of breath associated with the falls. He states he would lose his balance and wife said he would fall backwards or to either side. Normally he can walk without any assistance but his walking has been unsteady today. He denies any abdominal pain, fever, nausea, vomiting or urinary complaints. He had one episode of stroke in the past where he lost vision in his left eye however returned after 1-1/2 hours and he has had no symptoms since. No prior diagnosis of Parkinson's disease.  Patient is a 80 y.o. male presenting with fall and weakness. The history is provided by the patient, the spouse and a relative.  Fall This is a new problem. Episode onset: last 12 hours. The problem occurs constantly. The problem has not changed since onset.Pertinent negatives include no chest pain, no abdominal pain, no headaches and no shortness of breath. Associated symptoms comments: Patient has had some confusion per family today and is unsteady on his feet which is not his norm. Patient denies any recent medication changes or missed medications.. The symptoms are aggravated by walking. Nothing relieves the symptoms.  Weakness Pertinent negatives include no chest pain, no abdominal pain, no headaches and no shortness of breath.    Past Medical History  Diagnosis Date  . TIA   .  HYPERLIPIDEMIA   . CAD, ARTERY BYPASS GRAFT   . SUPRAVENTRICULAR TACHYCARDIA, HX OF   . Leukodystrophy   . GASTROESOPHAGEAL REFLUX DISEASE   . ASTHMA   . Diverticulosis   . Insomnia   . Anxiety   . Blood transfusion     "related to OHS, ulcers"  . Ulcer   . Colon polyps     adenomatous polyps  . COPD   . Sleep apnea     "don't always wear it" (03/20/2014)  . Arthritis     "lower back; left ankle" (03/20/2014)  . Nephrolithiasis     "I've had over 320 kidney stones" (03/20/2014)   Past Surgical History  Procedure Laterality Date  . Cardiac catheterization    . Gastrectomy    . Vagotomy    . Cystourethroscopy with stent removal.    . Irrigation and debridement sebaceous cyst      "off my back  . Colonoscopy    . Upper gastrointestinal endoscopy    . Excisional hemorrhoidectomy    . Polypectomy    . Coronary artery bypass graft  1996    CABG X2  . Prostate surgery      "took the center of my prostate out"  . Cystoscopy with ureteroscopy, stone basketry and stent placement    . Ankle fracture surgery Left ~ 2012  . Hiatal hernia repair     Family History  Problem Relation Age of Onset  . Heart disease Brother   . Diabetes Brother   . Alcohol abuse Father   . Heart disease Paternal Grandmother   .  Colon cancer Neg Hx   . Stomach cancer Neg Hx    Social History  Substance Use Topics  . Smoking status: Former Smoker -- 0.50 packs/day    Types: Cigarettes    Quit date: 10/25/1962  . Smokeless tobacco: Former Systems developer    Types: Chew  . Alcohol Use: No    Review of Systems  Respiratory: Negative for shortness of breath.   Cardiovascular: Negative for chest pain.  Gastrointestinal: Negative for abdominal pain.  Neurological: Positive for weakness. Negative for headaches.  All other systems reviewed and are negative.     Allergies  Beta adrenergic blockers; Gabapentin; Prozac; and Soma  Home Medications   Prior to Admission medications   Medication Sig Start  Date End Date Taking? Authorizing Provider  ALPRAZolam Duanne Moron) 1 MG tablet TAKE ONE TABLET BY MOUTH THREE TIMES DAILY FOR ANXIETY 12/30/14  Yes Unk Pinto, MD  aspirin 81 MG tablet Take 81 mg by mouth daily.     Yes Historical Provider, MD  Cholecalciferol (VITAMIN D3) 2000 UNITS capsule Take 8,000 Units by mouth daily.    Yes Historical Provider, MD  clopidogrel (PLAVIX) 75 MG tablet Take 1 tablet (75 mg total) by mouth daily. 03/11/15  Yes Unk Pinto, MD  fludrocortisone (FLORINEF) 0.1 MG tablet Take 1 tablet 2 x day or as directed for Low BP Patient taking differently: Take 0.1 mg by mouth 2 (two) times daily.  07/04/15 01/01/16 Yes Unk Pinto, MD  FREESTYLE LITE test strip CHECK BLOOD SUGAR 2 TIMES A DAY 11/27/14  Yes Unk Pinto, MD  Lancets (FREESTYLE) lancets 1 each by Other route as needed for other. Use as instructed   Yes Historical Provider, MD  loperamide (IMODIUM) 2 MG capsule Take 2 mg by mouth as needed for diarrhea or loose stools.   Yes Historical Provider, MD  Melatonin 5 MG CAPS Take 5 mg by mouth at bedtime.   Yes Historical Provider, MD  nitroGLYCERIN (NITROSTAT) 0.4 MG SL tablet Sig: 1 tablet under tongue every 3 to 5 minutes as needed for Angina - Please Dispense # 2 bottles of # 25 tabs 11/08/13  Yes Unk Pinto, MD  pantoprazole (PROTONIX) 40 MG tablet Take 1 tablet (40 mg total) by mouth daily. 02/10/15 02/10/16 Yes Unk Pinto, MD  potassium citrate (UROCIT-K) 10 MEQ (1080 MG) SR tablet Take 1 tablet (10 mEq total) by mouth 2 (two) times daily. 02/10/15  Yes Unk Pinto, MD  prednisoLONE acetate (PRED FORTE) 1 % ophthalmic suspension 1 to 2 drops to affected eye 4 x day as needed for allergies Patient taking differently: Place 1 drop into both eyes every 4 (four) hours as needed (allergies).  03/04/15 03/03/16 Yes Unk Pinto, MD  prochlorperazine (COMPAZINE) 5 MG tablet Take 1-2 tablets TID PRN for nausea. Patient taking differently: Take 5 mg by mouth  every 8 (eight) hours as needed for nausea.  07/09/14  Yes Unk Pinto, MD  promethazine (PHENERGAN) 25 MG tablet Take 1 to 2 tablets every 4 hours as needed for nausea or vomitting 04/02/14  Yes Unk Pinto, MD  QUEtiapine (SEROQUEL) 25 MG tablet TAKE ONE TABLET BY MOUTH THREE TIMES DAILY Patient taking differently: TAKE ONE TABLET BY MOUTH AT BEDTIME 02/13/15  Yes Unk Pinto, MD  simvastatin (ZOCOR) 80 MG tablet Take 1 tablet (80 mg total) by mouth at bedtime. 03/11/15  Yes Unk Pinto, MD  VIGAMOX 0.5 % ophthalmic solution Place 1 drop into both eyes daily as needed (INFECTION(S)).  08/16/14  Yes  Historical Provider, MD   BP 133/75 mmHg  Pulse 73  Temp(Src) 98.2 F (36.8 C) (Oral)  Resp 16  Wt 155 lb (70.308 kg)  SpO2 98% Physical Exam  Constitutional: He is oriented to person, place, and time. He appears well-developed and well-nourished. No distress.  HENT:  Head: Normocephalic and atraumatic.  Mouth/Throat: Oropharynx is clear and moist.  Eyes: Conjunctivae and EOM are normal. Pupils are equal, round, and reactive to light.  Neck: Normal range of motion. Neck supple.  Cardiovascular: Normal rate, regular rhythm and intact distal pulses.   No murmur heard. Pulmonary/Chest: Effort normal and breath sounds normal. No respiratory distress. He has no wheezes. He has no rales.  Abdominal: Soft. He exhibits no distension. There is no tenderness. There is no rebound and no guarding.  Musculoskeletal: Normal range of motion. He exhibits no edema or tenderness.  Neurological: He is alert and oriented to person, place, and time. He has normal strength. No sensory deficit.  Intention tremor of the left upper extremity. Some difficulty with heel-to-shin and finger to nose testing but is able to perform. Gait is slow and purposeful with mild shuffling and one episode of falling to the right. No visual field cuts. Patient complains of double vision when looking to the right which  resolves when the left eye is covered.  Skin: Skin is warm and dry. No rash noted. No erythema.  Psychiatric: He has a normal mood and affect. His behavior is normal.  Nursing note and vitals reviewed.   ED Course  Procedures (including critical care time) Labs Review Labs Reviewed  CBC - Abnormal; Notable for the following:    RBC 4.20 (*)    All other components within normal limits  COMPREHENSIVE METABOLIC PANEL - Abnormal; Notable for the following:    Potassium 3.4 (*)    Glucose, Bld 121 (*)    Creatinine, Ser 1.32 (*)    Total Protein 6.3 (*)    GFR calc non Af Amer 50 (*)    GFR calc Af Amer 58 (*)    All other components within normal limits  I-STAT CHEM 8, ED - Abnormal; Notable for the following:    Potassium 3.4 (*)    Glucose, Bld 116 (*)    All other components within normal limits  PROTIME-INR  APTT  DIFFERENTIAL  URINALYSIS, ROUTINE W REFLEX MICROSCOPIC (NOT AT [facility])  I-STAT TROPOININ, ED  CBG MONITORING, ED    Imaging Review Ct Head Wo Contrast  09/10/2015  CLINICAL DATA:  Multiple recent falls with head injury and loss of consciousness. History of stroke and leukodystrophy. Initial encounter. EXAM: CT HEAD WITHOUT CONTRAST TECHNIQUE: Contiguous axial images were obtained from the base of the skull through the vertex without intravenous contrast. COMPARISON:  Head CT 03/20/2014 and 10/15/2010. FINDINGS: There is no evidence of acute intracranial hemorrhage, mass lesion, brain edema or extra-axial fluid collection. The ventricles and subarachnoid spaces are appropriately sized for age. There is no CT evidence of acute cortical infarction. There are stable chronic hypodensities within the periventricular white matter and basal ganglia bilaterally. The visualized paranasal sinuses, mastoid air cells and middle ears are clear. The calvarium is intact. IMPRESSION: Stable examination with stable periventricular white matter disease and hypodensities in the basal  ganglia. No acute posttraumatic findings. Electronically Signed   By: Richardean Sale M.D.   On: 09/10/2015 15:15   I have personally reviewed and evaluated these images and lab results as part of my medical decision-making.  EKG Interpretation   Date/Time:  Wednesday September 10 2015 13:19:48 EST Ventricular Rate:  93 PR Interval:  172 QRS Duration: 82 QT Interval:  362 QTC Calculation: 450 R Axis:   62 Text Interpretation:  Normal sinus rhythm Minimal voltage criteria for  LVH, may be normal variant Nonspecific ST and T wave abnormality Artifact  Confirmed by Maryan Rued  MD, Loree Fee ([id number]) on 09/10/2015 2:04:12 PM      MDM   Final diagnoses:  Neuro-degenerative disorders    Patient presents today with multiple recurrent falls which is new in the last 12 hours. Sent here for concern for possible stroke. Family also states the patient has been confused. Here patient is awake alert and oriented and able to answer questions appropriately but then when asked other questions his answers topics since. No evidence of aphasia but some difficulty with gait here and some upper extremity rigidity and tremor. Patient has no evidence of infectious etiology at this time. Labs and vital signs are without acute findings. Neurology evaluated the patient and at this time feels that he has evidence of degenerative process going on in his brain similar to Parkinson's disease. Given his multiple falls in the last 12 hours and new confusion patient requires admission for MRI and further workup area patient will need PT evaluation and possible rehabilitation placement.    Blanchie Dessert, MD 09/10/15 [phone]

## 2015-09-10 NOTE — ED Notes (Signed)
Attempted report 

## 2015-09-10 NOTE — Consult Note (Signed)
Pt seen for neuro consult.  Repeated falls, dementia.  Newly diagnosed parkinson's by me in ER.  Ct brain showed b/l basal ganglia degenerative changes.   recs Mri brain amantadine 100  Mg bid Pt for gait, may need rehab vs snf d/c

## 2015-09-10 NOTE — ED Notes (Signed)
Pt attempted to void.  Was unable at this time. Urinal at bedside

## 2015-09-10 NOTE — Progress Notes (Signed)
Patient arrived to [phone] [id number]. Patient alert and oriented X4 with no c/o pain. Vital signs taken and charted. IVF started. Telemetry box 7 applied and CCMD notified. Oriented to room with bed alarm on . Diet ordered. Family at bedside.

## 2015-09-10 NOTE — ED Notes (Signed)
Attempted report X2 

## 2015-09-10 NOTE — ED Notes (Signed)
Pt returned from MRI. Admitting MD at bedside.

## 2015-09-10 NOTE — ED Notes (Signed)
Patient transported to MRI 

## 2015-09-11 DIAGNOSIS — R2681 Unsteadiness on feet: Secondary | ICD-10-CM

## 2015-09-11 DIAGNOSIS — W19XXXS Unspecified fall, sequela: Secondary | ICD-10-CM | POA: Diagnosis not present

## 2015-09-11 DIAGNOSIS — G20 Parkinson's disease: Secondary | ICD-10-CM | POA: Diagnosis not present

## 2015-09-11 LAB — COMPREHENSIVE METABOLIC PANEL
ALBUMIN: 3.4 g/dL — AB (ref 3.5–5.0)
ALT: 25 U/L (ref 17–63)
ANION GAP: 6 (ref 5–15)
AST: 32 U/L (ref 15–41)
Alkaline Phosphatase: 62 U/L (ref 38–126)
BUN: 14 mg/dL (ref 6–20)
CHLORIDE: 108 mmol/L (ref 101–111)
CO2: 29 mmol/L (ref 22–32)
Calcium: 9 mg/dL (ref 8.9–10.3)
Creatinine, Ser: 1.27 mg/dL — ABNORMAL HIGH (ref 0.61–1.24)
GFR calc non Af Amer: 52 mL/min — ABNORMAL LOW (ref >60)
GLUCOSE: 103 mg/dL — AB (ref 65–99)
Potassium: 4.2 mmol/L (ref 3.5–5.1)
Sodium: 143 mmol/L (ref 135–145)
Total Bilirubin: 0.6 mg/dL (ref 0.3–1.2)
Total Protein: 6 g/dL — ABNORMAL LOW (ref 6.5–8.1)

## 2015-09-11 LAB — FOLATE RBC
Folate, Hemolysate: >620 ng/mL
HEMATOCRIT: 39 % (ref 37.5–51.0)

## 2015-09-11 LAB — HEMOGLOBIN A1C
HEMOGLOBIN A1C: 5.4 % (ref 4.8–5.6)
MEAN PLASMA GLUCOSE: 108 mg/dL

## 2015-09-11 LAB — RPR: RPR Ser Ql: NONREACTIVE

## 2015-09-11 MED ORDER — FLUDROCORTISONE ACETATE 0.1 MG PO TABS: 0.1000 mg | ORAL_TABLET | Freq: Two times a day (BID) | ORAL | Status: DC

## 2015-09-11 MED ORDER — ALPRAZOLAM 0.5 MG PO TABS
0.5000 mg | ORAL_TABLET | Freq: Every evening | ORAL | Status: DC | PRN
Start: 2015-09-11 — End: 2015-09-25

## 2015-09-11 MED ORDER — AMANTADINE HCL 100 MG PO CAPS: 100.0000 mg | ORAL_CAPSULE | Freq: Two times a day (BID) | ORAL | Status: DC

## 2015-09-11 NOTE — Progress Notes (Signed)
Discharge orders received. Pt and spouse educated on discharge instructions. Pt given discharge packet and prescriptions. IV and tele removed. Pt dressed and belongings packed. Pt taken downstairs by staff via wheelchair.

## 2015-09-11 NOTE — Care Management Note (Signed)
Case Management Note  Patient Details  Name: John [name] MRN: [id number] Date of Birth: [birth year]  Subjective/Objective:                    Action/Plan: Patient being discharged home today with home health services. CM met with the patient and his wife and provided them a list of agencies covered by [facility] [employer]. They selected [facility]. Miranda with [facility] notified and accepted the referral. Will update the bedside RN.   Expected Discharge Date:                  Expected Discharge Plan:  [place]  In-House Referral:     Discharge planning Services  CM Consult  Post Acute Care Choice:  Home Health Choice offered to:  Patient, Spouse  DME Arranged:    DME Agency:     HH Arranged:  PT [place]:  [facility]  Status of Service:  Completed, signed off  Medicare Important Message Given:    Date Medicare IM Given:    Medicare IM give by:    Date Additional Medicare IM Given:    Additional Medicare Important Message give by:     If discussed at [place] of Stay Meetings, dates discussed:    Additional Comments:  Pollie Friar, RN 09/11/2015, 3:57 PM

## 2015-09-11 NOTE — Discharge Summary (Addendum)
Physician Discharge Summary  John [name] [id number] DOB: 11-28-1934 DOA: 09/10/2015  PCP: Alesia Richards, MD  Admit date: 09/10/2015 Discharge date: 09/11/2015  Time spent: 35 minutes  Recommendations for Outpatient Follow-up:  1. Neurology follow up 2. Home health   Discharge Diagnoses:  Principal Problem:   Encephalopathy Active Problems:   Type II diabetes mellitus with renal manifestations (HCC)   CKD (chronic kidney disease)   Frequent falls   Unsteady gait   Hypokalemia   Falls   Discharge Condition: improved  Diet recommendation: regular  Filed Weights   09/10/15 1323 09/11/15 0500  Weight: 70.308 kg (155 lb) 67.903 kg (149 lb 11.2 oz)    History of present illness:  John [name] is a 80 y.o. male lowered with a past medical history of TIA, hyperlipidemia, CAD, orthostatic hypertension, diabetes, COPD presents emergency department from his PCPs office with the chief complaint 3 falls in the last 24 hours. Initial evaluation in the emergency department reveals an abnormal head CT, unsteady gait and evaluation by neuro who opines newly diagnosed Parkinson's.  Information is obtained from the family who is at the bedside. Wife says her fall was really him rolling out of bed in his sleep. He was awakened and unable to get up off the floor. He was assisted to his feet and placed in the chair. Shortly thereafter he fell while trying to get out of the chair. Third fall was in the bathroom brushing his teeth. Associated symptoms include intermittent mildly slurred speech and confusion. He denies any episodes of dizziness chest pain palpitations syncope or near-syncope. He remained alert and oriented during all these episodes. There is no shortness of breath diaphoresis abdominal pain nausea vomiting or diarrhea. He states that his legs just feel "jerky". His baseline is ambulating without any assistance. No recent travel or sick contacts.  Workup in the  emergency department includes a CT of the brain showing basal ganglia degenerative changes, complete metabolic panel significant for potassium of 3.4 creatinine 1.3 serum glucose of 121, CBC unremarkable, PT and INR unremarkable initial troponin 0.01. EKG with normal sinus rhythm.  While in the emergency department he is afebrile hemodynamically stable and not hypoxic   Hospital Course:  Parkinson symptoms, recommend starting amantadine 100 mg twice a day after breakfast and after lunch. -outpatient neurology follow up Home health MRI negative   Procedures:    Consultations:  neuro  Discharge Exam: Filed Vitals:   09/11/15 1320  BP: 150/73  Pulse: 93  Temp: 98.4 F (36.9 C)  Resp: 20    General: awake, pleasant/cooperative   Discharge Instructions   Discharge Instructions    Ambulatory referral to Neurology    Complete by:  As directed   An appointment is requested in approximately: 1 week- 2 weeks for new parkinson diagnosis     Diet general    Complete by:  As directed      Discharge instructions    Complete by:  As directed   Home health     Increase activity slowly    Complete by:  As directed           Current Discharge Medication List    START taking these medications   Details  amantadine (SYMMETREL) 100 MG capsule Take 1 capsule (100 mg total) by mouth 2 (two) times daily. Qty: 60 capsule, Refills: 0      CONTINUE these medications which have CHANGED   Details  ALPRAZolam (XANAX) 0.5 MG tablet Take 1  tablet (0.5 mg total) by mouth at bedtime as needed for anxiety. Refills: 0    fludrocortisone (FLORINEF) 0.1 MG tablet Take 1 tablet (0.1 mg total) by mouth 2 (two) times daily. Qty: 60 tablet, Refills: 5   Associated Diagnoses: Autonomic postural hypotension      CONTINUE these medications which have NOT CHANGED   Details  aspirin 81 MG tablet Take 81 mg by mouth daily.      Cholecalciferol (VITAMIN D3) 2000 UNITS capsule Take 8,000 Units  by mouth daily.     clopidogrel (PLAVIX) 75 MG tablet Take 1 tablet (75 mg total) by mouth daily. Qty: 90 tablet, Refills: 3    FREESTYLE LITE test strip CHECK BLOOD SUGAR 2 TIMES A DAY Qty: 150 each, Refills: PRN    Lancets (FREESTYLE) lancets 1 each by Other route as needed for other. Use as instructed    loperamide (IMODIUM) 2 MG capsule Take 2 mg by mouth as needed for diarrhea or loose stools.    Melatonin 5 MG CAPS Take 5 mg by mouth at bedtime.    nitroGLYCERIN (NITROSTAT) 0.4 MG SL tablet Sig: 1 tablet under tongue every 3 to 5 minutes as needed for Angina - Please Dispense # 2 bottles of # 25 tabs Qty: 50 tablet, Refills: 99   Associated Diagnoses: Coronary atherosclerosis of unspecified type of vessel, native or graft    pantoprazole (PROTONIX) 40 MG tablet Take 1 tablet (40 mg total) by mouth daily. Qty: 90 tablet, Refills: 3    potassium citrate (UROCIT-K) 10 MEQ (1080 MG) SR tablet Take 1 tablet (10 mEq total) by mouth 2 (two) times daily. Qty: 180 tablet, Refills: 3    prednisoLONE acetate (PRED FORTE) 1 % ophthalmic suspension 1 to 2 drops to affected eye 4 x day as needed for allergies Qty: 15 mL, Refills: 1   Associated Diagnoses: Allergic conjunctivitis, unspecified laterality    prochlorperazine (COMPAZINE) 5 MG tablet Take 1-2 tablets TID PRN for nausea. Qty: 50 tablet, Refills: 0    simvastatin (ZOCOR) 80 MG tablet Take 1 tablet (80 mg total) by mouth at bedtime. Qty: 90 tablet, Refills: 3    VIGAMOX 0.5 % ophthalmic solution Place 1 drop into both eyes daily as needed (INFECTION(S)).  Refills: 0      STOP taking these medications     promethazine (PHENERGAN) 25 MG tablet      QUEtiapine (SEROQUEL) 25 MG tablet        Allergies  Allergen Reactions  . Beta Adrenergic Blockers Other (See Comments)    REACTION: Bradycardia  . Gabapentin Other (See Comments)    Severe confusion  . Prozac [Fluoxetine Hcl] Other (See Comments)    Patient state that  it does not agree with him  . Soma [Carisoprodol] Other (See Comments)    Patient stated that it does not agree with him   Follow-up Information    Follow up with Alesia Richards, MD In 1 week.   Specialty:  Internal Medicine   Contact information:   [address] [place]  [address] [phone]        The results of significant diagnostics from this hospitalization (including imaging, microbiology, ancillary and laboratory) are listed below for reference.    Significant Diagnostic Studies: Ct Head Wo Contrast  09/10/2015  CLINICAL DATA:  Multiple recent falls with head injury and loss of consciousness. History of stroke and leukodystrophy. Initial encounter. EXAM: CT HEAD WITHOUT CONTRAST TECHNIQUE: Contiguous axial images were obtained from  the base of the skull through the vertex without intravenous contrast. COMPARISON:  Head CT 03/20/2014 and 10/15/2010. FINDINGS: There is no evidence of acute intracranial hemorrhage, mass lesion, brain edema or extra-axial fluid collection. The ventricles and subarachnoid spaces are appropriately sized for age. There is no CT evidence of acute cortical infarction. There are stable chronic hypodensities within the periventricular white matter and basal ganglia bilaterally. The visualized paranasal sinuses, mastoid air cells and middle ears are clear. The calvarium is intact. IMPRESSION: Stable examination with stable periventricular white matter disease and hypodensities in the basal ganglia. No acute posttraumatic findings. Electronically Signed   By: Richardean Sale M.D.   On: 09/10/2015 15:15   Mr Brain Wo Contrast  09/10/2015  CLINICAL DATA:  Multiple falls today. Rule out stroke. Generalized weakness. Neuro degenerative disorder. EXAM: MRI HEAD WITHOUT CONTRAST TECHNIQUE: Multiplanar, multiecho pulse sequences of the brain and surrounding structures were obtained without intravenous contrast. COMPARISON:  CT head  09/10/2015 FINDINGS: Negative for acute infarct. Moderate atrophy. Moderate chronic microvascular ischemic changes in the white matter and pons. Hyperintensity in the globus pallidus bilaterally corresponds to the CT hypodensity. This is symmetric and most likely related to a benign process such as perivascular spaces or mineral deposition or degenerative change. Negative for intracranial hemorrhage or fluid collection Negative for mass or edema.  No shift of the midline structures Paranasal sinuses are clear.  Negative for orbital mass. IMPRESSION: Atrophy and chronic microvascular ischemia.  No acute abnormality. Electronically Signed   By: Franchot Gallo M.D.   On: 09/10/2015 17:33    Microbiology: No results found for this or any previous visit (from the past 240 hour(s)).   Labs: Basic Metabolic Panel:  Recent Labs Lab 09/10/15 1327 09/10/15 1332 09/10/15 1948 09/11/15 0625  NA 141 143  --  143  K 3.4* 3.4*  --  4.2  CL 107 103  --  108  CO2 25  --   --  29  GLUCOSE 121* 116*  --  103*  BUN 9 11  --  14  CREATININE 1.32* 1.20  --  1.27*  CALCIUM 8.9  --   --  9.0  MG  --   --  1.8  --    Liver Function Tests:  Recent Labs Lab 09/10/15 1327 09/11/15 0625  AST 35 32  ALT 29 25  ALKPHOS 65 62  BILITOT 0.6 0.6  PROT 6.3* 6.0*  ALBUMIN 3.7 3.4*   No results for input(s): LIPASE, AMYLASE in the last 168 hours. No results for input(s): AMMONIA in the last 168 hours. CBC:  Recent Labs Lab 09/10/15 1327 09/10/15 1332 09/10/15 1948  WBC 8.1  --   --   NEUTROABS 6.4  --   --   HGB 13.5 14.6  --   HCT 40.5 43.0 39.0  MCV 96.4  --   --   PLT 166  --   --    Cardiac Enzymes: No results for input(s): CKTOTAL, CKMB, CKMBINDEX, TROPONINI in the last 168 hours. BNP: BNP (last 3 results) No results for input(s): BNP in the last 8760 hours.  ProBNP (last 3 results) No results for input(s): PROBNP in the last 8760 hours.  CBG:  Recent Labs Lab 09/10/15 2109  GLUCAP  114*       Signed:  Tashaya Ancrum  [facility] 09/11/2015, 5:29 PM

## 2015-09-11 NOTE — Evaluation (Signed)
Physical Therapy Evaluation Patient Details Name: John [name] MRN: [id number] DOB: 1935-10-06 Today's Date: 09/11/2015   History of Present Illness  Came to ED due to multiple recent falls and confusion. Neurology consult diagnosed pt with Parkinson's disease, likely mild-moderate dementia and initiated meds PMHx-TIA, OA, CAD, COPD    Clinical Impression  Patient did surprisingly well with normal, functional mobility and gait (no losses of balance). With higher level balance testing, pt did have decreased balance with staggering, stepping and postural instability. He scored 41/56 on Berg Balance test (indicative of need for cane indoors--although with Parkinson's, the use of cane is often more problematic than helpful). Recommend HHPT for home safety evaluation, balance training, and ultimately progression to OPPT (specifically Parkinson's clinic).  Patient and wife comfortable with this plan as they state he is much better today.     Follow Up Recommendations Home health PT;Supervision for mobility/OOB (with progression to OPPT Parkinson't program)    Equipment Recommendations  None recommended by PT    Recommendations for Other Services       Precautions / Restrictions Precautions Precautions: Fall Precaution Comments: multiple recent falls      Mobility  Bed Mobility Overal bed mobility: Modified Independent             General bed mobility comments: incr time  Transfers Overall transfer level: Needs assistance Equipment used: None Transfers: Sit to/from Stand Sit to Stand: Supervision (with UE support; min assist without UEs)         General transfer comment: posterior stagger step without use of UEs  Ambulation/Gait Ambulation/Gait assistance: Min guard Ambulation Distance (Feet): 300 Feet Assistive device: None Gait Pattern/deviations: WFL(Within Functional Limits)   Gait velocity interpretation: at or above normal speed for age/gender General Gait  Details: maintaining straight path with head turns, incr and decr velocity, with 180 degree turns  Stairs            Wheelchair Mobility    Modified Rankin (Stroke Patients Only)       Balance Overall balance assessment: Needs assistance                           High level balance activites: Head turns;Sudden stops;Turns;Direction changes;Backward walking High Level Balance Comments: No loss of balance with higher level functional tasks Standardized Balance Assessment Standardized Balance Assessment : Berg Balance Test Berg Balance Test Sit to Stand: Able to stand  independently using hands Standing Unsupported: Able to stand safely 2 minutes Sitting with Back Unsupported but Feet Supported on Floor or Stool: Able to sit safely and securely 2 minutes Stand to Sit: Sits safely with minimal use of hands Transfers: Able to transfer safely, definite need of hands Standing Unsupported with Eyes Closed: Able to stand 10 seconds safely Standing Ubsupported with Feet Together: Needs help to attain position but able to stand for 30 seconds with feet together From Standing, Reach Forward with Outstretched Arm: Can reach confidently >25 cm (10") From Standing Position, Pick up Object from Floor: Able to pick up shoe, needs supervision From Standing Position, Turn to Look Behind Over each Shoulder: Looks behind from both sides and weight shifts well Turn 360 Degrees: Able to turn 360 degrees safely in 4 seconds or less Standing Unsupported, Alternately Place Feet on Step/Stool: Able to complete >2 steps/needs minimal assist Standing Unsupported, One Foot in Front: Needs help to step but can hold 15 seconds Standing on One Leg: Tries to lift leg/unable  to hold 3 seconds but remains standing independently Total Score: 41         Pertinent Vitals/Pain Pain Assessment: Faces Faces Pain Scale: Hurts a little bit Pain Location: Left thigh Pain Descriptors / Indicators:  Sore Pain Intervention(s): Limited activity within patient's tolerance;Monitored during session    Home Living Family/patient expects to be discharged to:: Private residence Living Arrangements: Spouse/significant other Available Help at Discharge: Family;Available 24 hours/day           Home Equipment: Walker - 2 wheels;Cane - single point;Shower seat - built in;Wheelchair - manual (DME due to past ankle fx)      Prior Function Level of Independence: Independent         Comments: states recent falls have typically been due to posterior LOB (denies pre-syncope)     Hand Dominance   Dominant Hand: Right    Extremity/Trunk Assessment   Upper Extremity Assessment: Overall WFL for tasks assessed (noted intention tremor (minor) )           Lower Extremity Assessment: Overall WFL for tasks assessed (a bit hypertonic)      Cervical / Trunk Assessment: Normal  Communication   Communication: No difficulties  Cognition Arousal/Alertness: Awake/alert Behavior During Therapy: WFL for tasks assessed/performed Overall Cognitive Status: Within Functional Limits for tasks assessed       Memory: Decreased short-term memory              General Comments General comments (skin integrity, edema, etc.): wife present    Exercises        Assessment/Plan    PT Assessment All further PT needs can be met in the next venue of care (if pt remains hospitalized for medical reasons, will f/u)  PT Diagnosis Difficulty walking   PT Problem List Decreased balance;Decreased knowledge of use of DME;Impaired tone  PT Treatment Interventions     PT Goals (Current goals can be found in the Care Plan section) Acute Rehab PT Goals Patient Stated Goal: return home PT Goal Formulation: With patient/family Time For Goal Achievement: 09/18/15 Potential to Achieve Goals: Good    Frequency     Barriers to discharge        Co-evaluation               End of Session  Equipment Utilized During Treatment: Gait belt Activity Tolerance: Patient tolerated treatment well Patient left: in chair;with call bell/phone within reach;with chair alarm set;with family/visitor present Nurse Communication: Mobility status    Functional Assessment Tool Used: clinical judgement Functional Limitation: Mobility: Walking and moving around Mobility: Walking and Moving Around Current Status [phone]): At least 20 percent but less than 40 percent impaired, limited or restricted Mobility: Walking and Moving Around Goal Status [phone]): At least 1 percent but less than 20 percent impaired, limited or restricted    Time: 8657-8469 PT Time Calculation (min) (ACUTE ONLY): 31 min   Charges:   PT Evaluation $Initial PT Evaluation Tier I: 1 Procedure PT Treatments $Gait Training: 8-22 mins   PT G Codes:   PT G-Codes **NOT FOR INPATIENT CLASS** Functional Assessment Tool Used: clinical judgement Functional Limitation: Mobility: Walking and moving around Mobility: Walking and Moving Around Current Status (G2952): At least 20 percent but less than 40 percent impaired, limited or restricted Mobility: Walking and Moving Around Goal Status [phone]): At least 1 percent but less than 20 percent impaired, limited or restricted    John [name] 09/11/2015, 3:24 PM Pager [phone]

## 2015-09-11 NOTE — Care Management Note (Signed)
Case Management Note  Patient Details  Name: John [name] MRN: [id number] Date of Birth: [birth year]  Subjective/Objective:    Patient admitted with Encephalopathy and has had multiple falls at home. Neuro consult shows a new diagnosis of Parkinsons. Patient is from home with his spouse.         Action/Plan:  Awaiting PT/OT recommendations. CM will continue to follow for discharge needs.  Expected Discharge Date:                  Expected Discharge Plan:     In-House Referral:     Discharge planning Services     Post Acute Care Choice:    Choice offered to:     DME Arranged:    DME Agency:     HH Arranged:    HH Agency:     Status of Service:  In process, will continue to follow  Medicare Important Message Given:    Date Medicare IM Given:    Medicare IM give by:    Date Additional Medicare IM Given:    Additional Medicare Important Message give by:     If discussed at [place] of Stay Meetings, dates discussed:    Additional Comments:  Pollie Friar, RN 09/11/2015, 11:34 AM

## 2015-09-12 DIAGNOSIS — E1122 Type 2 diabetes mellitus with diabetic chronic kidney disease: Secondary | ICD-10-CM | POA: Diagnosis not present

## 2015-09-12 DIAGNOSIS — G9340 Encephalopathy, unspecified: Secondary | ICD-10-CM | POA: Diagnosis not present

## 2015-09-12 DIAGNOSIS — R269 Unspecified abnormalities of gait and mobility: Secondary | ICD-10-CM | POA: Diagnosis not present

## 2015-09-12 DIAGNOSIS — R296 Repeated falls: Secondary | ICD-10-CM | POA: Diagnosis not present

## 2015-09-22 ENCOUNTER — Ambulatory Visit (INDEPENDENT_AMBULATORY_CARE_PROVIDER_SITE_OTHER): Payer: PPO | Admitting: Neurology

## 2015-09-22 ENCOUNTER — Encounter: Payer: Self-pay | Admitting: Neurology

## 2015-09-22 VITALS — BP 142/62 | HR 70 | Resp 16 | Ht 71.0 in | Wt 153.0 lb

## 2015-09-22 DIAGNOSIS — G214 Vascular parkinsonism: Secondary | ICD-10-CM

## 2015-09-22 DIAGNOSIS — G479 Sleep disorder, unspecified: Secondary | ICD-10-CM

## 2015-09-22 DIAGNOSIS — R413 Other amnesia: Secondary | ICD-10-CM | POA: Diagnosis not present

## 2015-09-22 MED ORDER — AMANTADINE HCL 100 MG PO CAPS: 100.0000 mg | ORAL_CAPSULE | Freq: Two times a day (BID) | ORAL | Status: DC

## 2015-09-22 NOTE — Patient Instructions (Addendum)
You may have vascular parkinsonism.  Use your cane for safety.  Exercise regularly.  Drink more water.  We will continue with amantadine 100 mg twice daily.  We will monitor your symptoms.  Follow up in about 4 months.  Have your wife or children observe your driving.

## 2015-09-22 NOTE — Progress Notes (Signed)
Subjective:    Patient ID: John [name] is a 80 y.o. male.  HPI     Star Age, MD, PhD [facility] [address] [place], [place] [address]  Dear Dr. Melford Aase,   I saw your patient, John [name], upon your kind request, in my neurologic clinic today for initial consultation of his parkinsonism. The patient is accompanied by his daughter today. As you know, John [name] is a 80 year old right-handed gentleman with an underlying medical history of hypertension, hyperlipidemia, remote history of smoking, current history of chewing tobacco, type 2 diabetes, vitamin D deficiency, coronary artery disease, status post CABG (two-vessel, 1996), reflux disease, depression, chronic kidney disease, history of TIA, asthma, COPD, history of kidney stones, history of SVT, arthritis and obstructive sleep apnea, who was recently admitted to the hospital on 09/10/2015 secondary to encephalopathy. He was discharged on 09/11/2015 and I reviewed the hospital records including the discharge summary. He was admitted from the emergency room after a fall at home. His wife was unable to help him after his fall. He rolled out of bed. He had recurrent falls at home prior to that. CT head without contrast in the emergency room on 09/10/2015 showed: Stable examination with stable periventricular white matter disease and hypodensities in the basal ganglia. No acute posttraumatic findings.  Postadmission he had a brain MRI without contrast on 09/10/2015: Atrophy and chronic microvascular ischemia.  No acute abnormality. In addition, I personally reviewed the images through the PACS system. I agree with the findings.  During his hospitalization, neurology was consulted and suggested he start amantadine 100 mg twice daily for parkinsonism.   He was discharged to home with home health physical therapy which he is currently enrolled in.    I reviewed your office note from  09/04/2015.  He has had a tremor in both hands for a few months, some memory loss. His daughter, John [name], reports that he has had some instances of missing a turn recently. He is currently not driving but would like to go back to driving. He has had a history of orthostatic hypotension with improvement after he was started on Florinef several months ago. He has no family history of Parkinson's disease. He feels that the amantadine has helped. He denies any side effects. He has a history of problems sleeping at night and has been on Xanax at night for quite some time. About a year ago he was started on Seroquel at night. He has no history of dream enactments.   His Past Medical History Is Significant For: Past Medical History  Diagnosis Date  . TIA   . HYPERLIPIDEMIA   . CAD, ARTERY BYPASS GRAFT   . SUPRAVENTRICULAR TACHYCARDIA, HX OF   . Leukodystrophy   . GASTROESOPHAGEAL REFLUX DISEASE   . ASTHMA   . Diverticulosis   . Insomnia   . Anxiety   . Blood transfusion     "related to OHS, ulcers"  . Ulcer   . Colon polyps     adenomatous polyps  . COPD   . Sleep apnea     "don't always wear it" (03/20/2014)  . Arthritis     "lower back; left ankle" (03/20/2014)  . Nephrolithiasis     "I've had over 320 kidney stones" (03/20/2014)    His Past Surgical History Is Significant For: Past Surgical History  Procedure Laterality Date  . Cardiac catheterization    . Gastrectomy    . Vagotomy    .  Cystourethroscopy with stent removal.    . Irrigation and debridement sebaceous cyst      "off my back  . Colonoscopy    . Upper gastrointestinal endoscopy    . Excisional hemorrhoidectomy    . Polypectomy    . Coronary artery bypass graft  1996    CABG X2  . Prostate surgery      "took the center of my prostate out"  . Cystoscopy with ureteroscopy, stone basketry and stent placement    . Ankle fracture surgery Left ~ 2012  . Hiatal hernia repair      His Family History Is Significant  For: Family History  Problem Relation Age of Onset  . Heart disease Brother   . Diabetes Brother   . Alcohol abuse Father   . Heart disease Paternal Grandmother   . Colon cancer Neg Hx   . Stomach cancer Neg Hx   . Stroke Mother     His Social History Is Significant For: Social History   Social History  . Marital Status: Married    Spouse Name: N/A  . Number of Children: 2  . Years of Education: 14   Occupational History  . retired-maintenance dept at [employer] Topics  . Smoking status: Former Smoker -- 0.50 packs/day    Types: Cigarettes    Quit date: 10/25/1962  . Smokeless tobacco: Current User    Types: Chew  . Alcohol Use: No  . Drug Use: No  . Sexual Activity: Yes   Other Topics Concern  . None   Social History Narrative   Denies caffeine use     His Allergies Are:  Allergies  Allergen Reactions  . Beta Adrenergic Blockers Other (See Comments)    REACTION: Bradycardia  . Gabapentin Other (See Comments)    Severe confusion  . Prozac [Fluoxetine Hcl] Other (See Comments)    Patient state that it does not agree with him  . Soma [Carisoprodol] Other (See Comments)    Patient stated that it does not agree with him  :   His Current Medications Are:  Outpatient Encounter Prescriptions as of 09/22/2015  Medication Sig  . ALPRAZolam (XANAX) 0.5 MG tablet Take 1 tablet (0.5 mg total) by mouth at bedtime as needed for anxiety. (Patient taking differently: Take 1 mg by mouth at bedtime as needed for anxiety. )  . amantadine (SYMMETREL) 100 MG capsule Take 1 capsule (100 mg total) by mouth 2 (two) times daily.  [name] aspirin 81 MG tablet Take 81 mg by mouth daily.    . Cholecalciferol (VITAMIN D3) 2000 UNITS capsule Take 8,000 Units by mouth daily.   . clopidogrel (PLAVIX) 75 MG tablet Take 1 tablet (75 mg total) by mouth daily.  . fludrocortisone (FLORINEF) 0.1 MG tablet Take 1 tablet (0.1 mg total) by mouth 2 (two) times daily.  [name] FREESTYLE  LITE test strip CHECK BLOOD SUGAR 2 TIMES A DAY  . Lancets (FREESTYLE) lancets 1 each by Other route as needed for other. Use as instructed  . loperamide (IMODIUM) 2 MG capsule Take 2 mg by mouth as needed for diarrhea or loose stools.  . Melatonin 5 MG CAPS Take 5 mg by mouth at bedtime.  . nitroGLYCERIN (NITROSTAT) 0.4 MG SL tablet Sig: 1 tablet under tongue every 3 to 5 minutes as needed for Angina - Please Dispense # 2 bottles of # 25 tabs  . pantoprazole (PROTONIX) 40 MG tablet Take 1 tablet (40 mg total)  by mouth daily.  . potassium citrate (UROCIT-K) 10 MEQ (1080 MG) SR tablet Take 1 tablet (10 mEq total) by mouth 2 (two) times daily.  . prednisoLONE acetate (PRED FORTE) 1 % ophthalmic suspension 1 to 2 drops to affected eye 4 x day as needed for allergies (Patient taking differently: Place 1 drop into both eyes every 4 (four) hours as needed (allergies). )  . prochlorperazine (COMPAZINE) 5 MG tablet Take 1-2 tablets TID PRN for nausea. (Patient taking differently: Take 5 mg by mouth every 8 (eight) hours as needed for nausea. )  . simvastatin (ZOCOR) 80 MG tablet Take 1 tablet (80 mg total) by mouth at bedtime.  [name] VIGAMOX 0.5 % ophthalmic solution Place 1 drop into both eyes daily as needed (INFECTION(S)).    No facility-administered encounter medications on file as of 09/22/2015.  :   Review of Systems:  Out of a complete 14 point review of systems, all are reviewed and negative with the exception of these symptoms as listed below:   Review of Systems  Neurological: Positive for tremors.       Patient reports having falls recently, some falls involved injury. Last fall he went to Primary care who then sent patient to emergency room. Tremors noticed in both hands per daughter. Patient is currently getting PT at home.   Hematological: Bruises/bleeds easily.  Psychiatric/Behavioral: Positive for confusion.       Anxiety    Objective:  Neurologic Exam  Physical Exam Physical  Examination:   Filed Vitals:   09/22/15 1400  BP: 142/62  Pulse: 70  Resp: 16    General Examination: The patient is a very pleasant 80 y.o. male in no acute distress.  HEENT: Normocephalic, atraumatic, pupils are equal, round and reactive to light and accommodation. Funduscopic exam is normal with sharp disc margins noted. Extraocular tracking shows mild saccadic breakdown without nystagmus noted. There is limitation to upper gaze. There is no significant decrease in eye blink rate. Hearing is intact. Face is symmetric with no significant facial masking and normal facial sensation. There is no lip, neck or jaw tremor. Neck is mildly rigid with intact passive ROM. There are no carotid bruits on auscultation. Oropharynx exam reveals moderate mouth dryness and moderate airway crowding noted. Mallampati is class II. Tongue protrudes centrally and palate elevates symmetrically.   Chest: is clear to auscultation without wheezing, rhonchi or crackles noted.  Heart: sounds are regular and normal without murmurs, rubs or gallops noted.   Abdomen: is soft, non-tender and non-distended with normal bowel sounds appreciated on auscultation.  Extremities: There is no pitting edema in the distal lower extremities bilaterally. Pedal pulses are intact. Some smaller bruises are noticed on the distal upper extremities.  Skin: is warm and dry with no trophic changes noted.    Musculoskeletal: exam reveals no obvious joint deformities, tenderness, joint swelling or erythema.  Neurologically:  Mental status: The patient is awake and alert, paying good  attention. He is able to partially provide the history. Daughter provides details. His memory, attention, language and knowledge are mildly impaired. There is no aphasia, agnosia, apraxia or anomia. There is a mild degree of bradyphrenia. Speech is mildly hypophonic with no dysarthria noted. Mood is congruent and affect is normal.   Cranial nerves are as  described above under HEENT exam. In addition, shoulder shrug is normal with equal shoulder height noted.  Motor exam: Normal bulk, and strength for age is noted. There are no dyskinesias noted.  Tone  is very mildly rigid. There is no significant evidence of cogwheeling. There is no resting tremor. He has a minimal postural tremor in the left upper extremity only. He has on fine motor skill testing in the upper extremities mild difficulty, probably a little bit worse on the left. In the lower extremities he has mild to moderate difficulty with fine motor skills including foot taps and foot agility. He stands without significant problems. He has no cane or walker with him today. His posture is age-appropriate. He walks with slightly decreased stride length and pace and preserved arm swing bilaterally with some reduction on the left noted. He turns in 3 steps. Tandem walk is not possible.  Sensory exam is intact  in the upper and lower extremities bilaterally. Reflexes are about 1+ throughout, absent in the ankles.   Assessment and Plan:   In summary, John [name] is a very pleasant 80 y.o.-year old male with an underlying medical history of hypertension, hyperlipidemia, remote history of smoking, current history of chewing tobacco, type 2 diabetes, vitamin D deficiency, coronary artery disease, status post CABG (two-vessel, 1996), reflux disease, depression, chronic kidney disease, history of TIA, asthma, COPD, history of kidney stones, history of SVT, arthritis and obstructive sleep apnea, who was recently hospitalized after recurrent falls at home. During his hospitalization he was found to have symptoms and signs of parkinsonism. Today, on examination he has very mild signs of parkinsonism bilaterally. He has perhaps slight lateralization to the left. His head CT and brain MRI showed white matter changes including hypodensities in both basal ganglia. He is at risk for vascular parkinsonism and  vascular dementia. He has multiple vascular risk factors. Today, I talked at length with the patient and any about his condition and his medications. I talked to him about the importance of staying well hydrated and physical exercise. He is advised to continue with home health physical therapy and use his cane for safety. He has been on Seroquel for the past year. I would not suggest escalating this. He has been on Xanax at night for a long time. I did not suggest any new medications. We will monitor his symptoms including his memory. We also talked about his driving today. I suggested until next visit no driving and we will probably in the future limit his driving. I have asked his daughter to observe his driving, this can be done by his son as well. I told him in particular that he should stay away from highways and nighttime driving. For now, I have requested that he not drive. I renewed his prescription for amantadine. I will see him back routinely in 4 months, sooner if needed. I answered all her questions today and the patient and his daughter were in agreement.  Thank you very much for allowing me to participate in the care of this nice patient. If I can be of any further assistance to you please do not hesitate to call me at [phone].  Sincerely,   Star Age, MD, PhD

## 2015-09-25 ENCOUNTER — Other Ambulatory Visit: Payer: Self-pay | Admitting: Internal Medicine

## 2015-09-25 NOTE — Telephone Encounter (Signed)
Rx called into [facility].

## 2015-10-06 ENCOUNTER — Encounter: Payer: Self-pay | Admitting: Cardiovascular Disease

## 2015-10-06 ENCOUNTER — Ambulatory Visit (INDEPENDENT_AMBULATORY_CARE_PROVIDER_SITE_OTHER): Payer: PPO | Admitting: Cardiovascular Disease

## 2015-10-06 VITALS — BP 122/70 | HR 69 | Ht 71.0 in | Wt 156.8 lb

## 2015-10-06 DIAGNOSIS — I2510 Atherosclerotic heart disease of native coronary artery without angina pectoris: Secondary | ICD-10-CM

## 2015-10-06 DIAGNOSIS — I6523 Occlusion and stenosis of bilateral carotid arteries: Secondary | ICD-10-CM

## 2015-10-06 DIAGNOSIS — I493 Ventricular premature depolarization: Secondary | ICD-10-CM

## 2015-10-06 DIAGNOSIS — E785 Hyperlipidemia, unspecified: Secondary | ICD-10-CM

## 2015-10-06 NOTE — Patient Instructions (Signed)

## 2015-10-06 NOTE — Progress Notes (Signed)
Chief Complaint  Patient presents with  . Follow-up    CAD/ PT HAS NO COMPLAINTS      History of Present Illness: 80 yo male with history of CAD s/p 2 V CABG 1996 , DM, HLD, TIA here today for cardiac follow up. He has been followed in the past by Dr. Lia Foyer. Last cath 2002 with patent LIMA to LAD. He is on Plavix for stroke prevention per primary care. Echo November 2014 with normal LV systolic function, mild AI. Carotid dopplers November 2014 with mild bilateral disease. He was last seen in my office early in May 2015 and was doing well with some fatigue. Stress myoview May 2015 with no ischemia. Admitted to [facility] May 2015 with syncope/diarrhea. Found to have C. Diff and was treated. He was admitted to [facility] in November 2016 and diagnosed with Parkinson's disease.   He has been doing well. No chest pain or SOB. No near syncope or syncope. He is having some balance issues.   Primary Care Physician: Melford Aase  Last Lipid Profile: Followed in primary care.    Past Medical History  Diagnosis Date  . TIA   . HYPERLIPIDEMIA   . CAD, ARTERY BYPASS GRAFT   . SUPRAVENTRICULAR TACHYCARDIA, HX OF   . Leukodystrophy   . GASTROESOPHAGEAL REFLUX DISEASE   . ASTHMA   . Diverticulosis   . Insomnia   . Anxiety   . Blood transfusion     "related to OHS, ulcers"  . Ulcer   . Colon polyps     adenomatous polyps  . COPD   . Sleep apnea     "don't always wear it" (03/20/2014)  . Arthritis     "lower back; left ankle" (03/20/2014)  . Nephrolithiasis     "I've had over 320 kidney stones" (03/20/2014)    Past Surgical History  Procedure Laterality Date  . Cardiac catheterization    . Gastrectomy    . Vagotomy    . Cystourethroscopy with stent removal.    . Irrigation and debridement sebaceous cyst      "off my back  . Colonoscopy    . Upper gastrointestinal endoscopy    . Excisional hemorrhoidectomy    . Polypectomy    . Coronary artery bypass graft  1996    CABG X2  . Prostate  surgery      "took the center of my prostate out"  . Cystoscopy with ureteroscopy, stone basketry and stent placement    . Ankle fracture surgery Left ~ 2012  . Hiatal hernia repair      Current Outpatient Prescriptions  Medication Sig Dispense Refill  . ALPRAZolam (XANAX) 1 MG tablet TAKE ONE TABLET BY MOUTH THREE TIMES DAILY AS NEEDED FOR ANXIETY 270 tablet 0  . amantadine (SYMMETREL) 100 MG capsule Take 1 capsule (100 mg total) by mouth 2 (two) times daily. 180 capsule 3  . aspirin 81 MG tablet Take 81 mg by mouth daily.      . Cholecalciferol (VITAMIN D3) 2000 UNITS capsule Take 8,000 Units by mouth daily.     . clopidogrel (PLAVIX) 75 MG tablet Take 1 tablet (75 mg total) by mouth daily. 90 tablet 3  . fludrocortisone (FLORINEF) 0.1 MG tablet Take 1 tablet (0.1 mg total) by mouth 2 (two) times daily. 60 tablet 5  . FREESTYLE LITE test strip CHECK BLOOD SUGAR 2 TIMES A DAY 150 each PRN  . Lancets (FREESTYLE) lancets 1 each by Other route as needed for other.  Use as instructed    . loperamide (IMODIUM) 2 MG capsule Take 2 mg by mouth as needed for diarrhea or loose stools.    . Melatonin 5 MG CAPS Take 5 mg by mouth at bedtime.    . nitroGLYCERIN (NITROSTAT) 0.4 MG SL tablet Sig: 1 tablet under tongue every 3 to 5 minutes as needed for Angina - Please Dispense # 2 bottles of # 25 tabs 50 tablet 99  . pantoprazole (PROTONIX) 40 MG tablet Take 1 tablet (40 mg total) by mouth daily. 90 tablet 3  . potassium citrate (UROCIT-K) 10 MEQ (1080 MG) SR tablet Take 1 tablet (10 mEq total) by mouth 2 (two) times daily. 180 tablet 3  . prednisoLONE acetate (PRED FORTE) 1 % ophthalmic suspension 1 to 2 drops to affected eye 4 x day as needed for allergies (Patient taking differently: Place 1 drop into both eyes every 4 (four) hours as needed (allergies). ) 15 mL 1  . prochlorperazine (COMPAZINE) 5 MG tablet Take 1-2 tablets TID PRN for nausea. (Patient taking differently: Take 5 mg by mouth every 8  (eight) hours as needed for nausea. ) 50 tablet 0  . simvastatin (ZOCOR) 80 MG tablet Take 1 tablet (80 mg total) by mouth at bedtime. (Patient taking differently: Take 40 mg by mouth at bedtime. ) 90 tablet 3  . VIGAMOX 0.5 % ophthalmic solution Place 1 drop into both eyes daily as needed (INFECTION(S)).   0   No current facility-administered medications for this visit.    Allergies  Allergen Reactions  . Gabapentin Other (See Comments)    Severe confusion  . Prozac [Fluoxetine Hcl] Other (See Comments)    Patient state that it does not agree with him  . Soma [Carisoprodol] Other (See Comments)    Patient stated that it does not agree with him  . Beta Adrenergic Blockers Other (See Comments)    REACTION: Bradycardia    Social History   Social History  . Marital Status: Married    Spouse Name: N/A  . Number of Children: 2  . Years of Education: 14   Occupational History  . retired-maintenance dept at [employer]  . Smoking status: Former Smoker -- 0.50 packs/day    Types: Cigarettes    Quit date: 10/25/1962  . Smokeless tobacco: Current User    Types: Chew  . Alcohol Use: No  . Drug Use: No  . Sexual Activity: Yes   Other Topics Concern  . Not on file   Social History Narrative   Denies caffeine use     Family History  Problem Relation Age of Onset  . Heart disease Brother   . Diabetes Brother   . Alcohol abuse Father   . Heart disease Paternal Grandmother   . Colon cancer Neg Hx   . Stomach cancer Neg Hx   . Stroke Mother     Review of Systems:  As stated in the HPI and otherwise negative.   BP 122/70 mmHg  Pulse 69  Ht 5\' 11"  (1.803 m)  Wt 156 lb 12.8 oz (71.124 kg)  BMI 21.88 kg/m2  SpO2 99%  Physical Examination: General: Well developed, well nourished, NAD HEENT: OP clear, mucus membranes moist SKIN: warm, dry. No rashes. Neuro: No focal deficits Musculoskeletal: Muscle strength 5/5 all ext Psychiatric: Mood and  affect normal Neck: No JVD, no carotid bruits, no thyromegaly, no lymphadenopathy. Lungs:Clear bilaterally, no wheezes, rhonci, crackles Cardiovascular: Regular rate and  rhythm. No murmurs, gallops or rubs. Abdomen:Soft. Bowel sounds present. Non-tender.  Extremities: No lower extremity edema. Pulses are 2 + in the bilateral DP/PT  Cardiac cath March 2002:  The left main coronary artery is free of significant  disease.  The LAD coursed to the apex. There was about 80% segmental narrowing at the  origin of the large diagonal branch. The large diagonal branch itself had an  eccentric 80% plaque.  The internal mammary to the diagonal subsequently going to the LAD was patent.  There was perhaps irregularity beyond the insertion site at the diagonal  leading into the LAD segment but this was not focally significant. Runoff  into both diagonal and LAD distally was without significant narrowing.  The circumflex proper provided both two marginal branches and an AV  circumflex. There was minimal luminal irregularity but no high-grade  stenosis.  The right coronary artery was a dominant vessel providing posterior descending  and posterolateral branch. There is perhaps 30-40% narrowing throughout the  whole proximal and mid segment. However, high-grade focal disease was not  noted and this does not appear to be flow-limiting.  Echo 09/04/13:  Left ventricle: The cavity size was normal. Wall thickness was normal. Systolic function was normal. The estimated ejection fraction was in the range of 50% to 55%. Wall motion was normal; there were no regional wall motion abnormalities. Doppler parameters are consistent with abnormal left ventricular relaxation (grade 1 diastolic dysfunction). - Aortic valve: Mild regurgitation. - Mitral valve: There was mild systolic anterior motion of the chordal structures. - Left atrium: The atrium was mildly dilated.  Stress myoview 03/05/14: Stress Procedure:  The patient received IV Lexiscan 0.4 mg over 15-seconds with concurrent low level exercise and then Technetium 63m Sestamibi was injected at 30-seconds while the patient continued walking one more minute. The patient complained of headache with Lexiscan. Quantitative spect images were obtained after a 45-minute delay. Stress ECG: Sinus rhythm with T wave inversion in lead V3 through V6, mild. QPS Raw Data Images: Mild diaphragmatic attenuation. Normal left ventricular size. Stress Images: There is decreased uptake in the inferior wall. Rest Images: There is decreased uptake in the inferior wall. Subtraction (SDS): No evidence of ischemia. Transient Ischemic Dilatation (Normal <1.22): 1.06 Lung/Heart Ratio (Normal <0.45): 0.25 Quantitative Gated Spect Images QGS EDV: 138 ml QGS ESV: 59 ml Impression Exercise Capacity: Lexiscan with low level exercise. BP Response: Normal blood pressure response. Clinical Symptoms: Typical symptoms with Lexiscan. ECG Impression: Nondiagnostic mild T wave inversion at low level exercise. Comparison with Prior Nuclear Study: No images to compare Overall Impression: Low risk stress nuclear study With no areas of significant ischemia identified. Inferior wall decreased uptake likely secondary to diaphragmatic attenuation.. LV Ejection Fraction: 57%. LV Wall Motion: NL LV Function; NL Wall Motion  EKG:  EKG is not ordered today.  Recent Labs: 09/10/2015: Hemoglobin 14.6; Magnesium 1.8; Platelets 166; TSH 1.262 09/11/2015: ALT 25; BUN 14; Creatinine, Ser 1.27*; Potassium 4.2; Sodium 143   Lipid Panel    Component Value Date/Time   CHOL 145 09/10/2015 1948   TRIG 69 09/10/2015 1948   HDL 57 09/10/2015 1948   CHOLHDL 2.5 09/10/2015 1948   VLDL 14 09/10/2015 1948   LDLCALC 74 09/10/2015 1948     Wt Readings from Last 3 Encounters:  10/06/15 156 lb 12.8 oz (71.124 kg)  09/22/15 153 lb (69.4 kg)  09/11/15 149 lb 11.2 oz (67.903 kg)      Other studies Reviewed: Additional studies/ records  that were reviewed today include:. Review of the above records demonstrates:    Assessment and Plan:   1.CAD: Stable. He is on ASA and Plavix daily and statin. He does not tolerate beta blockers. Echo 09/04/13 with normal LV function. Stress myoview May 2015 with no ischemia.   2. HLD: On a statin. Lipids well controlled and followed in primary care. No changes.   3. Carotid artery disease: Mild by dopplers today April 2016. Repeat in April 2018.  4. PVCs: No symptoms. He stopped Cardizem and is doing well.   Current medicines are reviewed at length with the patient today.  The patient does not have concerns regarding medicines.  The following changes have been made:  no change  Labs/ tests ordered today include: None No orders of the defined types were placed in this encounter.    Disposition:   FU with me  in 12 months  Signed, Lauree Chandler, MD 10/06/2015 4:25 PM    [facility] [address], [place], [place]  [address] Phone: [phone]; Fax: [phone]

## 2015-11-07 DIAGNOSIS — Z85828 Personal history of other malignant neoplasm of skin: Secondary | ICD-10-CM | POA: Diagnosis not present

## 2015-11-07 DIAGNOSIS — C44329 Squamous cell carcinoma of skin of other parts of face: Secondary | ICD-10-CM | POA: Diagnosis not present

## 2015-11-07 DIAGNOSIS — L821 Other seborrheic keratosis: Secondary | ICD-10-CM | POA: Diagnosis not present

## 2015-11-07 DIAGNOSIS — L570 Actinic keratosis: Secondary | ICD-10-CM | POA: Diagnosis not present

## 2015-11-07 DIAGNOSIS — D485 Neoplasm of uncertain behavior of skin: Secondary | ICD-10-CM | POA: Diagnosis not present

## 2015-11-07 DIAGNOSIS — D1801 Hemangioma of skin and subcutaneous tissue: Secondary | ICD-10-CM | POA: Diagnosis not present

## 2015-11-17 ENCOUNTER — Ambulatory Visit (INDEPENDENT_AMBULATORY_CARE_PROVIDER_SITE_OTHER): Payer: PPO | Admitting: Neurology

## 2015-11-17 ENCOUNTER — Encounter: Payer: Self-pay | Admitting: Neurology

## 2015-11-17 VITALS — BP 112/58 | HR 70 | Resp 16 | Ht 71.0 in | Wt 159.0 lb

## 2015-11-17 DIAGNOSIS — R413 Other amnesia: Secondary | ICD-10-CM

## 2015-11-17 DIAGNOSIS — G214 Vascular parkinsonism: Secondary | ICD-10-CM | POA: Diagnosis not present

## 2015-11-17 NOTE — Progress Notes (Signed)
Subjective:    Patient ID: John [name] is a 81 y.o. male.  HPI     Interim history:   Mr. Harmon is a 81 year old right-handed gentleman with an underlying medical history of hypertension, hyperlipidemia, remote history of smoking, current history of chewing tobacco, type 2 diabetes, vitamin D deficiency, coronary artery disease, status post CABG (two-vessel, 1996), reflux disease, depression, chronic kidney disease, history of TIA, asthma, COPD, history of kidney stones, history of SVT, arthritis and obstructive sleep apnea, who presents for follow-up consultation of his parkinsonism. He is unaccompanied today. I first met him on 09/22/2015 after a fall and a recent hospitalization for this. I kept him on amantadine 100 mg twice daily at the time.  Today, 11/17/2015: He reports no new symptoms and no new complaints. He continues to take amantadine 100 mg twice daily. He tries to hydrate well. He does not drink much caffeine. He has not had any recent falls thankfully. He lives with his wife. He feels that his memory is not as good as it used to especially since his hospital stay in November 2016.  Previously:  09/22/2015: He was recently admitted to the hospital on 09/10/2015 secondary to encephalopathy. He was discharged on 09/11/2015 and I reviewed the hospital records including the discharge summary. He was admitted from the emergency room after a fall at home. His wife was unable to help him after his fall. He rolled out of bed. He had recurrent falls at home prior to that. CT head without contrast in the emergency room on 09/10/2015 showed: Stable examination with stable periventricular white matter disease and hypodensities in the basal ganglia. No acute posttraumatic findings.  Postadmission he had a brain MRI without contrast on 09/10/2015: Atrophy and chronic microvascular ischemia.  No acute abnormality. In addition, I personally reviewed the images through the PACS system. I  agree with the findings.  During his hospitalization, neurology was consulted and suggested he start amantadine 100 mg twice daily for parkinsonism.   He was discharged to home with home health physical therapy which he is currently enrolled in.    I reviewed your office note from 09/04/2015.  He has had a tremor in both hands for a few months, some memory loss. His daughter, [name], reports that he has had some instances of missing a turn recently. He is currently not driving but would like to go back to driving. He has had a history of orthostatic hypotension with improvement after he was started on Florinef several months ago. He has no family history of Parkinson's disease. He feels that the amantadine has helped. He denies any side effects. He has a history of problems sleeping at night and has been on Xanax at night for quite some time. About a year ago he was started on Seroquel at night. He has no history of dream enactments.    His Past Medical History Is Significant For: Past Medical History  Diagnosis Date  . TIA   . HYPERLIPIDEMIA   . CAD, ARTERY BYPASS GRAFT   . SUPRAVENTRICULAR TACHYCARDIA, HX OF   . Leukodystrophy   . GASTROESOPHAGEAL REFLUX DISEASE   . ASTHMA   . Diverticulosis   . Insomnia   . Anxiety   . Blood transfusion     "related to OHS, ulcers"  . Ulcer   . Colon polyps     adenomatous polyps  . COPD   . Sleep apnea     "don't always wear it" (03/20/2014)  . Arthritis     "  lower back; left ankle" (03/20/2014)  . Nephrolithiasis     "I've had over 320 kidney stones" (03/20/2014)    His Past Surgical History Is Significant For: Past Surgical History  Procedure Laterality Date  . Cardiac catheterization    . Gastrectomy    . Vagotomy    . Cystourethroscopy with stent removal.    . Irrigation and debridement sebaceous cyst      "off my back  . Colonoscopy    . Upper gastrointestinal endoscopy    . Excisional hemorrhoidectomy    . Polypectomy    .  Coronary artery bypass graft  1996    CABG X2  . Prostate surgery      "took the center of my prostate out"  . Cystoscopy with ureteroscopy, stone basketry and stent placement    . Ankle fracture surgery Left ~ 2012  . Hiatal hernia repair      His Family History Is Significant For: Family History  Problem Relation Age of Onset  . Heart disease Brother   . Diabetes Brother   . Alcohol abuse Father   . Heart disease Paternal Grandmother   . Colon cancer Neg Hx   . Stomach cancer Neg Hx   . Stroke Mother     His Social History Is Significant For: Social History   Social History  . Marital Status: Married    Spouse Name: N/A  . Number of Children: 2  . Years of Education: 14   Occupational History  . retired-maintenance dept at [employer]    Social History Main Topics  . Smoking status: Former Smoker -- 0.50 packs/day    Types: Cigarettes    Quit date: 10/25/1962  . Smokeless tobacco: Current User    Types: Chew  . Alcohol Use: No  . Drug Use: No  . Sexual Activity: Yes   Other Topics Concern  . None   Social History Narrative   Denies caffeine use     His Allergies Are:  Allergies  Allergen Reactions  . Gabapentin Other (See Comments)    Severe confusion  . Prozac [Fluoxetine Hcl] Other (See Comments)    Patient state that it does not agree with him  . Soma [Carisoprodol] Other (See Comments)    Patient stated that it does not agree with him  . Beta Adrenergic Blockers Other (See Comments)    REACTION: Bradycardia  :   His Current Medications Are:  Outpatient Encounter Prescriptions as of 11/17/2015  Medication Sig  . ALPRAZolam (XANAX) 1 MG tablet TAKE ONE TABLET BY MOUTH THREE TIMES DAILY AS NEEDED FOR ANXIETY  . amantadine (SYMMETREL) 100 MG capsule Take 1 capsule (100 mg total) by mouth 2 (two) times daily.  [name] aspirin 81 MG tablet Take 81 mg by mouth daily.    . Cholecalciferol (VITAMIN D3) 2000 UNITS capsule Take 8,000 Units by mouth daily.   .  clopidogrel (PLAVIX) 75 MG tablet Take 1 tablet (75 mg total) by mouth daily.  . fludrocortisone (FLORINEF) 0.1 MG tablet Take 1 tablet (0.1 mg total) by mouth 2 (two) times daily.  [name] FREESTYLE LITE test strip CHECK BLOOD SUGAR 2 TIMES A DAY  . Lancets (FREESTYLE) lancets 1 each by Other route as needed for other. Use as instructed  . loperamide (IMODIUM) 2 MG capsule Take 2 mg by mouth as needed for diarrhea or loose stools.  . Melatonin 5 MG CAPS Take 5 mg by mouth at bedtime.  . nitroGLYCERIN (NITROSTAT) 0.4 MG SL tablet  Sig: 1 tablet under tongue every 3 to 5 minutes as needed for Angina - Please Dispense # 2 bottles of # 25 tabs  . pantoprazole (PROTONIX) 40 MG tablet Take 1 tablet (40 mg total) by mouth daily.  . potassium citrate (UROCIT-K) 10 MEQ (1080 MG) SR tablet Take 1 tablet (10 mEq total) by mouth 2 (two) times daily.  . prednisoLONE acetate (PRED FORTE) 1 % ophthalmic suspension 1 to 2 drops to affected eye 4 x day as needed for allergies (Patient taking differently: Place 1 drop into both eyes every 4 (four) hours as needed (allergies). )  . prochlorperazine (COMPAZINE) 5 MG tablet Take 1-2 tablets TID PRN for nausea. (Patient taking differently: Take 5 mg by mouth every 8 (eight) hours as needed for nausea. )  . simvastatin (ZOCOR) 80 MG tablet Take 1 tablet (80 mg total) by mouth at bedtime. (Patient taking differently: Take 40 mg by mouth at bedtime. )  . VIGAMOX 0.5 % ophthalmic solution Place 1 drop into both eyes daily as needed (INFECTION(S)).    No facility-administered encounter medications on file as of 11/17/2015.  :  Review of Systems:  Out of a complete 14 point review of systems, all are reviewed and negative with the exception of these symptoms as listed below:   Review of Systems  Neurological:       Patient is here to get medication refill. No new concerns.     Objective:  Neurologic Exam  Physical Exam Physical Examination:   Filed Vitals:   11/17/15  1513  BP: 112/58  Pulse: 70  Resp: 16    General Examination: The patient is a very pleasant 81 y.o. male in no acute distress.  HEENT: Normocephalic, atraumatic, pupils are equal, round and reactive to light and accommodation. Funduscopic exam is normal with sharp disc margins noted. He is status post bilateral cataract repairs. Extraocular tracking shows mild saccadic breakdown without nystagmus noted. There is limitation to upper gaze. There is no significant decrease in eye blink rate. Hearing is intact. Face is symmetric with no significant facial masking and normal facial sensation. There is no lip, neck or jaw tremor. Neck is mildly rigid with intact passive ROM. There are no carotid bruits on auscultation. Oropharynx exam reveals moderate mouth dryness and moderate airway crowding noted. Mallampati is class II. Tongue protrudes centrally and palate elevates symmetrically.   Chest: is clear to auscultation without wheezing, rhonchi or crackles noted.  Heart: sounds are regular and normal without murmurs, rubs or gallops noted.   Abdomen: is soft, non-tender and non-distended with normal bowel sounds appreciated on auscultation.  Extremities: There is no pitting edema in the distal lower extremities bilaterally. Pedal pulses are intact.   Skin: is warm and dry with no trophic changes noted.    Musculoskeletal: exam reveals no obvious joint deformities, tenderness, joint swelling or erythema.  Neurologically:  Mental status: The patient is awake and alert, paying good  attention. He is able to give a fairly good history. His memory, attention, language and knowledge are very mildly impaired. There is no aphasia, agnosia, apraxia or anomia. There is a mild degree of bradyphrenia. Speech is mildly hypophonic with no dysarthria noted. Mood is congruent and affect is normal.   Cranial nerves are as described above under HEENT exam. In addition, shoulder shrug is normal with equal shoulder  height noted.  Motor exam: Normal bulk, and strength for age is noted. There are no dyskinesias noted.  Tone is very  mildly rigid. There is no significant evidence of cogwheeling. There is no resting tremor. He has a minimal postural tremor in the left upper extremity only. He has mild difficulty with fine motor testing in the upper extremities, little bit worse in the lower extremities. Not much lateralization is noted today. He stands up with mild difficulty and pushes himself up. Posture is age-appropriate. He walks with preserved arm swing bilaterally, no freezing is noted. He is slightly insecure with turns. Tandem walk is not possible for safety reasons. Sensory exam is intact in the upper and lower extremities bilaterally. Reflexes are about 1+ throughout, absent in the ankles.   Assessment and Plan:   In summary, KAVON VALENZA is a very pleasant 81 year old male with an underlying medical history of hypertension, hyperlipidemia, remote history of smoking, current history of chewing tobacco, type 2 diabetes, vitamin D deficiency, coronary artery disease, status post CABG (two-vessel, 1996), reflux disease, depression, chronic kidney disease, history of TIA, asthma, COPD, history of kidney stones, history of SVT, arthritis and obstructive sleep apnea, who presents for follow up consultation of his parkinsonism. He was hospitalized in November 2016 and was noted to have some signs of parkinsonism. He was placed on amantadine 100 mg twice daily which I maintained when I first met him on 09/22/2015. We will continue with his medication at this time. He has no significant signs or symptoms of Parkinson's disease. 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 His head CT and brain MRI showed white matter changes including hypodensities in both basal ganglia. He is at risk for vascular parkinsonism and vascular dementia. He has multiple vascular risk factors. I talked to him about the importance of staying well  hydrated and physical exercise. He  has finished home health physical therapy. He has been off of Seroquel. He does use Xanax at night and has been on it for quite some time. I suggested no new medications and a four-month checkup routinely, sooner if needed. I answered all his questions today and the patient was in agreement.  I spent 20 minutes in total face-to-face time with the patient, more than 50% of which was spent in counseling and coordination of care, reviewing test results, reviewing medication and discussing or reviewing the diagnosis of parkinsonism, its prognosis and treatment options.

## 2015-11-17 NOTE — Patient Instructions (Signed)
We will monitor your symptoms.  We will continue the amantadine 100 mg twice daily for now.  Stay well hydrated with water and change positions slowly.  I will see you back in 4 months.

## 2015-11-21 DIAGNOSIS — L820 Inflamed seborrheic keratosis: Secondary | ICD-10-CM | POA: Diagnosis not present

## 2015-11-21 DIAGNOSIS — L570 Actinic keratosis: Secondary | ICD-10-CM | POA: Diagnosis not present

## 2015-11-21 DIAGNOSIS — D485 Neoplasm of uncertain behavior of skin: Secondary | ICD-10-CM | POA: Diagnosis not present

## 2015-11-24 ENCOUNTER — Other Ambulatory Visit: Payer: Self-pay | Admitting: *Deleted

## 2015-11-24 DIAGNOSIS — Z1212 Encounter for screening for malignant neoplasm of rectum: Secondary | ICD-10-CM

## 2015-11-24 LAB — POC HEMOCCULT BLD/STL (HOME/3-CARD/SCREEN)
Card #3 Fecal Occult Blood, POC: NEGATIVE
FECAL OCCULT BLD: NEGATIVE
Fecal Occult Blood, POC: NEGATIVE

## 2015-11-25 DIAGNOSIS — C44329 Squamous cell carcinoma of skin of other parts of face: Secondary | ICD-10-CM | POA: Diagnosis not present

## 2015-12-10 ENCOUNTER — Ambulatory Visit: Payer: Self-pay | Admitting: Internal Medicine

## 2015-12-10 ENCOUNTER — Encounter: Payer: Self-pay | Admitting: Physician Assistant

## 2015-12-10 ENCOUNTER — Ambulatory Visit (INDEPENDENT_AMBULATORY_CARE_PROVIDER_SITE_OTHER): Payer: PPO | Admitting: Physician Assistant

## 2015-12-10 VITALS — BP 108/70 | HR 74 | Temp 97.5°F | Resp 16 | Ht 71.0 in | Wt 157.2 lb

## 2015-12-10 DIAGNOSIS — I25709 Atherosclerosis of coronary artery bypass graft(s), unspecified, with unspecified angina pectoris: Secondary | ICD-10-CM | POA: Diagnosis not present

## 2015-12-10 DIAGNOSIS — R6889 Other general symptoms and signs: Secondary | ICD-10-CM

## 2015-12-10 DIAGNOSIS — K219 Gastro-esophageal reflux disease without esophagitis: Secondary | ICD-10-CM | POA: Diagnosis not present

## 2015-12-10 DIAGNOSIS — Z0001 Encounter for general adult medical examination with abnormal findings: Secondary | ICD-10-CM | POA: Diagnosis not present

## 2015-12-10 DIAGNOSIS — E559 Vitamin D deficiency, unspecified: Secondary | ICD-10-CM

## 2015-12-10 DIAGNOSIS — I951 Orthostatic hypotension: Secondary | ICD-10-CM

## 2015-12-10 DIAGNOSIS — I10 Essential (primary) hypertension: Secondary | ICD-10-CM | POA: Diagnosis not present

## 2015-12-10 DIAGNOSIS — Z0000 Encounter for general adult medical examination without abnormal findings: Secondary | ICD-10-CM

## 2015-12-10 DIAGNOSIS — N182 Chronic kidney disease, stage 2 (mild): Secondary | ICD-10-CM

## 2015-12-10 DIAGNOSIS — E1121 Type 2 diabetes mellitus with diabetic nephropathy: Secondary | ICD-10-CM

## 2015-12-10 DIAGNOSIS — E782 Mixed hyperlipidemia: Secondary | ICD-10-CM | POA: Diagnosis not present

## 2015-12-10 DIAGNOSIS — Z79899 Other long term (current) drug therapy: Secondary | ICD-10-CM

## 2015-12-10 DIAGNOSIS — I25700 Atherosclerosis of coronary artery bypass graft(s), unspecified, with unstable angina pectoris: Secondary | ICD-10-CM

## 2015-12-10 LAB — CBC WITH DIFFERENTIAL/PLATELET
BASOS ABS: 0 10*3/uL (ref 0.0–0.1)
Basophils Relative: 1 % (ref 0–1)
Eosinophils Absolute: 0.2 10*3/uL (ref 0.0–0.7)
Eosinophils Relative: 4 % (ref 0–5)
HEMATOCRIT: 44.1 % (ref 39.0–52.0)
HEMOGLOBIN: 15 g/dL (ref 13.0–17.0)
LYMPHS ABS: 1.6 10*3/uL (ref 0.7–4.0)
LYMPHS PCT: 39 % (ref 12–46)
MCH: 32.1 pg (ref 26.0–34.0)
MCHC: 34 g/dL (ref 30.0–36.0)
MCV: 94.4 fL (ref 78.0–100.0)
MONOS PCT: 10 % (ref 3–12)
MPV: 9.5 fL (ref 8.6–12.4)
Monocytes Absolute: 0.4 10*3/uL (ref 0.1–1.0)
NEUTROS ABS: 1.9 10*3/uL (ref 1.7–7.7)
NEUTROS PCT: 46 % (ref 43–77)
Platelets: 203 10*3/uL (ref 150–400)
RBC: 4.67 MIL/uL (ref 4.22–5.81)
RDW: 14.1 % (ref 11.5–15.5)
WBC: 4.2 10*3/uL (ref 4.0–10.5)

## 2015-12-10 MED ORDER — OXAZEPAM 30 MG PO CAPS: 30.0000 mg | ORAL_CAPSULE | Freq: Every evening | ORAL | Status: DC | PRN

## 2015-12-10 NOTE — Progress Notes (Signed)
Patient ID: John [name], male   DOB: August 17, 1935, 81 y.o.   MRN: [id number]  Medicare wellness and 3 month OV   Assessment:   1. Essential hypertension - CBC with Differential/Platelet - BASIC METABOLIC PANEL WITH GFR - Hepatic function panel - TSH  2. Mixed hyperlipidemia - Lipid panel  3. Type 2 diabetes mellitus with diabetic nephropathy, without long-term current use of insulin (John [name]) resolved  4. Vitamin D deficiency - VITAMIN D 25 Hydroxy (Vit-D Deficiency, Fractures)  5. Autonomic postural hypotension Continue florinef  6. 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   7. Gastroesophageal reflux disease, esophagitis presence not specified Continue PPI/H2 blocker, diet discussed  8. Medication management - Magnesium  9.  CKD (chronic kidney disease), stage 2 (mild) Increase fluids, avoid NSAIDS, monitor sugars, will monitor  10. parkinsonism Continue neuro follow up, will stop seroquel  11. Insomnia Stop seroquel, try serax 30mg , discussed with Dr. Melford Aase.    Plan:   During the course of the visit the patient was educated and counseled about appropriate screening and preventive services including:    Pneumococcal vaccine   Influenza vaccine  Td vaccine  Screening electrocardiogram  Bone densitometry screening  Colorectal cancer screening  Diabetes screening  Glaucoma screening  Nutrition counseling   Advanced directives: requested  Conditions/risks identified: BMI: Discussed weight loss, diet, and increase physical activity.  Increase physical activity: AHA recommends 150 minutes of physical activity a week.  Medications reviewed Diabetes is at goal, ACE/ARB therapy: not indicated Urinary Incontinence is an issue: discussed non pharmacology and pharmacology options.  Fall risk: moderate- discussed PT, home fall  assessment, medications.   Subjective:    John [name]  presents for [employer] Visit and 3 month follow up. Date of last medicare wellness visit was 08/27/2014.  He has had elevated blood pressure circa 1960's. He has had a long hx/o labile BP wit dysautonomia and postural hypotension and has been weaned off of BP meds and transitioned to Florinef which seems to be controlin his postural hypotension.   His blood pressure has been controlled at home, today his BP is BP: 108/70 mmHg.  He was admitted to the hospital 09/10/2015 due to secondary encephalopathy and discharged 09/11/2015, while in the hospital, he started on amantadine 100mg  for parkinsonism, he is now following with Dr. 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 He is on seroquel and xanax at night for sleep.  In 1996, he had a 2-V CABG and has done well since, remains on plavix for MI/stroke prevention.  He uses a treadmill occasionally. He denies chest pain, shortness of breath, dizziness.  He is on cholesterol medication and denies myalgias. His cholesterol is near, but  is not at goal. The cholesterol last visit was:  Lab Results  Component Value Date   CHOL 145 09/10/2015   HDL 57 09/10/2015   LDLCALC 74 09/10/2015   TRIG 69 09/10/2015   CHOLHDL 2.5 09/10/2015   He has had diabetes for 20 years since 1995. He has been working on diet and exercise for diabetes and denies foot ulcerations, hyperglycemia, hyperglycemia  , increased appetite, nausea, paresthesia of the feet, polydipsia and visual disturbances. Last A1C in the office was:  Lab Results  Component Value Date   HGBA1C 5.4 09/10/2015   Patient is on Vitamin D supplement.   Lab Results  Component Value Date   VD25OH 2  09/04/2015     He follows with skin surgery center, had recent skin cancer removed on right head 3 weeks ago, has follow up.   Names of Other Physician/Practitioners you currently use: 1. [facility] here for primary care 2. Dr's Hollander & Lyles, eye doctor, last visit Oct 2016 (Cat surgery)  3. Dr Cottie Banda, DDS, dentist, last visit 2016 - up to date - every 6 month  Patient Care Team: Unk Pinto, MD as PCP - General Burnell Blanks, MD as Consulting Physician (Cardiology) Inda Castle, MD as Consulting Physician (Gastroenterology) Jannette Spanner, MD as Referring Physician (Dermatology)  Medication Review:   Medication List       This list is accurate as of: 12/10/15  2:44 PM.  Always use your most recent med list.               ALPRAZolam 1 MG tablet  Commonly known as:  XANAX  TAKE ONE TABLET BY MOUTH THREE TIMES DAILY AS NEEDED FOR ANXIETY     amantadine 100 MG capsule  Commonly known as:  SYMMETREL  Take 1 capsule (100 mg total) by mouth 2 (two) times daily.     aspirin 81 MG tablet  Take 81 mg by mouth daily.     clopidogrel 75 MG tablet  Commonly known as:  PLAVIX  Take 1 tablet (75 mg total) by mouth daily.     fludrocortisone 0.1 MG tablet  Commonly known as:  FLORINEF  Take 1 tablet (0.1 mg total) by mouth 2 (two) times daily.     freestyle lancets  1 each by Other route as needed for other. Use as instructed     FREESTYLE LITE test strip  Generic drug:  glucose blood  CHECK BLOOD SUGAR 2 TIMES A DAY     loperamide 2 MG capsule  Commonly known as:  IMODIUM  Take 2 mg by mouth as needed for diarrhea or loose stools.     Melatonin 5 MG Caps  Take 5 mg by mouth at bedtime.     nitroGLYCERIN 0.4 MG SL tablet  Commonly known as:  NITROSTAT  Sig: 1 tablet under tongue every 3 to 5 minutes as needed for Angina - Please Dispense # 2 bottles of # 25 tabs     pantoprazole 40 MG tablet  Commonly known as:  PROTONIX  Take 1 tablet (40 mg total) by mouth daily.     potassium citrate 10 MEQ (1080 MG) SR tablet  Commonly known as:  UROCIT-K  Take 1 tablet (10 mEq total) by mouth 2 (two) times daily.      prednisoLONE acetate 1 % ophthalmic suspension  Commonly known as:  PRED FORTE  1 to 2 drops to affected eye 4 x day as needed for allergies     prochlorperazine 5 MG tablet  Commonly known as:  COMPAZINE  Take 1-2 tablets TID PRN for nausea.     simvastatin 80 MG tablet  Commonly known as:  ZOCOR  Take 1 tablet (80 mg total) by mouth at bedtime.     VIGAMOX 0.5 % ophthalmic solution  Generic drug:  moxifloxacin  Place 1 drop into both eyes daily as needed (INFECTION(S)).     Vitamin D3 2000 units capsule  Take 8,000 Units by mouth daily.        Allergies  Allergen Reactions  . Gabapentin Other (See Comments)    Severe confusion  . Prozac [Fluoxetine Hcl] Other (  See Comments)    Patient state that it does not agree with him  . Soma [Carisoprodol] Other (See Comments)    Patient stated that it does not agree with him  . Beta Adrenergic Blockers Other (See Comments)    REACTION: Bradycardia   Current Problems (verified) Patient Active Problem List   Diagnosis Date Noted  . Frequent falls 09/10/2015  . Encephalopathy 09/10/2015  . Unsteady gait 09/10/2015  . Delirium 09/10/2015  . Hypokalemia 09/10/2015  . Falls 09/10/2015  . Body mass index (BMI) of 21.0-21.9 in adult 09/04/2015  . Esophageal reflux 07/09/2015  . Autonomic postural hypotension 02/06/2015  . C. difficile diarrhea 03/21/2014  . CKD (chronic kidney disease) 03/20/2014  . Medication management 02/06/2014  . Vitamin D deficiency 02/06/2014  . Gastric AV malformation 10/30/2012  . Duodenal ulcer without hemorrhage or perforation 12/29/2011  . Personal history of colonic polyps 09/14/2011  . Type II diabetes mellitus with renal manifestations ([place]) 03/12/2009  . Mixed hyperlipidemia 03/12/2009  . Essential hypertension 03/12/2009  . ASCAD/CABG 03/12/2009  . TIA 03/12/2009  . ASTHMA 03/12/2009  . COPD 03/12/2009  . GERD 03/12/2009  . SUPRAVENTRICULAR TACHYCARDIA, HX OF 03/12/2009  .  NEPHROLITHIASIS, HX OF 03/12/2009   Screening Tests Health Maintenance  Topic Date Due  . OPHTHALMOLOGY EXAM  08/16/2015  . HEMOGLOBIN A1C  03/09/2016  . INFLUENZA VACCINE  05/25/2016  . FOOT EXAM  09/03/2016  . URINE MICROALBUMIN  09/03/2016  . COLONOSCOPY  09/06/2018  . TETANUS/TDAP  08/02/2023  . ZOSTAVAX  Completed  . PNA vac Low Risk Adult  Completed   Immunization History  Administered Date(s) Administered  . Influenza, High Dose Seasonal PF 08/13/2013, 08/05/2014, 07/23/2015  . Pneumococcal Conjugate-13 08/27/2014  . Pneumococcal Polysaccharide-23 08/06/2012  . Tdap 08/01/2013  . Zoster 05/02/2006   Preventative care: Last colonoscopy: 09/06/2013 recommend 5 years   Past Surgical History  Procedure Laterality Date  . Cardiac catheterization    . Gastrectomy    . Vagotomy    . Cystourethroscopy with stent removal.    . Irrigation and debridement sebaceous cyst      "off my back  . Colonoscopy    . Upper gastrointestinal endoscopy    . Excisional hemorrhoidectomy    . Polypectomy    . Coronary artery bypass graft  1996    CABG X2  . Prostate surgery      "took the center of my prostate out"  . Cystoscopy with ureteroscopy, stone basketry and stent placement    . Ankle fracture surgery Left ~ 2012  . Hiatal hernia repair      Risk Factors: Tobacco Social History  Substance Use Topics  . Smoking status: Former Smoker -- 0.50 packs/day    Types: Cigarettes    Quit date: 10/25/1962  . Smokeless tobacco: Current User    Types: Chew  . Alcohol Use: No   He does not smoke.  Patient is a former smoker. Are there smokers in your home (other than you)?  No  Alcohol Current alcohol use: none  Caffeine Current caffeine use: denies use  Exercise Current exercise: treadmill occasionally  Nutrition/Diet Current diet: in general, a "healthy" diet    Cardiac risk factors: advanced age (older than 91 for men, 58 for women), diabetes mellitus, dyslipidemia,  hypertension, male gender, sedentary lifestyle and smoking/ tobacco exposure.  Depression Screen (Note: if answer to either of the following is "Yes", a more complete depression screening is indicated)   Q1: Over the  past two weeks, have you felt down, depressed or hopeless? No  Q2: Over the past two weeks, have you felt little interest or pleasure in doing things? No  Have you lost interest or pleasure in daily life? No  Do you often feel hopeless? No  Do you cry easily over simple problems? No  Activities of Daily Living In your present state of health, do you have any difficulty performing the following activities?:  Driving? Yes Managing money?  No Feeding yourself? No Getting from bed to chair? No Climbing a flight of stairs? Yes Preparing food and eating?: No Bathing or showering? No Getting dressed: No Getting to the toilet? No Using the toilet:No Moving around from place to place: No In the past year have you fallen or had a near fall?:Yes   Vision Difficulties: No  Hearing Difficulties: No Do you often ask people to speak up or repeat themselves? No Do you experience ringing or noises in your ears? No Do you have difficulty understanding soft or whispered voices? No  Cognition  Do you feel that you have a problem with memory?Yes  Do you often misplace items? Yes  Do you feel safe at home?  Yes  Advanced directives Does patient have a [place]? Yes Does patient have a Living Will? Yes   Objective:     BP 108/70 mmHg  Pulse 74  Temp(Src) 97.5 F (36.4 C) (Temporal)  Resp 16  Ht 5\' 11"  (1.803 m)  Wt 157 lb 3.2 oz (71.305 kg)  BMI 21.93 kg/m2  SpO2 97%  General Appearance:  Alert  WD/WN, male  in no apparent distress. Eyes: PERRLA, EOMs nl, conjunctiva normal, normal fundi and vessels. Sinuses: No frontal/maxillary tenderness ENT/Mouth: EACs patent / TMs  nl. Nares clear without erythema, swelling, mucoid exudates. Oral hygiene is  good. No erythema, swelling, or exudate. Tongue normal, non-obstructing. Tonsils not swollen or erythematous. Hearing intact Neck: Supple, thyroid normal. No bruits, nodes or JVD. Respiratory: Respiratory effort normal.  BS equal and clear bilateral without rales, rhonci, wheezing or stridor. Cardio: Heart sounds are normal with regular rate and rhythm and no murmurs, rubs or gallops. Peripheral pulses are normal and equal bilaterally without edema. No aortic or femoral bruits. Chest: symmetric with normal excursions and percussion.  Abdomen: Flat, soft, with nl bowel sounds. Nontender, no guarding, rebound, hernias, masses, or organomegaly.  Lymphatics: Non tender without lymphadenopathy.  Genitourinary: defer. Musculoskeletal: Full ROM all peripheral extremities, joint stability, 5/5 strength, and normal gait. Skin: Warm and dry without rashes, lesions, cyanosis, clubbing or  ecchymosis.  Neuro: Cranial nerves intact, reflexes equal bilaterally. Normal muscle tone, no cerebellar symptoms. Sensation intact to touch. Pysch: Alert and oriented X 3 with normal affect, insight and judgment appropriate.   Cognitive Testing  Alert? Yes  Normal Appearance? Yes  Oriented to person? Yes  Place? Yes   Time? Yes  Recall of three objects?  Yes  Can perform simple calculations? Yes  Displays appropriate judgment? Yes  Can read the correct time from a watch/clock? Yes  Medicare Attestation I have personally reviewed: The patient's medical and social history Their use of alcohol, tobacco or illicit drugs Their current medications and supplements The patient's functional ability including ADLs,fall risks, home safety risks, cognitive, and hearing and visual impairment Diet and physical activities Evidence for depression or mood disorders  The patient's weight, height, BMI, and visual acuity have been recorded in the chart.  I have made referrals, counseling, and  provided education to the patient  based on review of the above and I have provided the patient with a written personalized care plan for preventive services.  Over 40 minutes of exam, counseling, chart review was performed.  Vicie Mutters, PA-C   12/10/2015

## 2015-12-10 NOTE — Patient Instructions (Signed)
Stop the seroquel and stop the xanax Start the serax 30mg  tablet once at night Can take xanax with it 1/2 a pill if not able to sleep If it makes you dizzy or does not help than stop it and call the office.   Preventive Care for Adults A healthy lifestyle and preventive care can promote health and wellness. Preventive health guidelines for men include the following key practices:  A routine yearly physical is a good way to check with your health care provider about your health and preventative screening. It is a chance to share any concerns and updates on your health and to receive a thorough exam.  Visit your dentist for a routine exam and preventative care every 6 months. Brush your teeth twice a day and floss once a day. Good oral hygiene prevents tooth decay and gum disease.  The frequency of eye exams is based on your age, health, family medical history, use of contact lenses, and other factors. Follow your health care provider's recommendations for frequency of eye exams.  Eat a healthy diet. Foods such as vegetables, fruits, whole grains, low-fat dairy products, and lean protein foods contain the nutrients you need without too many calories. Decrease your intake of foods high in solid fats, added sugars, and salt. Eat the right amount of calories for you.Get information about a proper diet from your health care provider, if necessary.  Regular physical exercise is one of the most important things you can do for your health. Most adults should get at least 150 minutes of moderate-intensity exercise (any activity that increases your heart rate and causes you to sweat) each week. In addition, most adults need muscle-strengthening exercises on 2 or more days a week.  Maintain a healthy weight. The body mass index (BMI) is a screening tool to identify possible weight problems. It provides an estimate of body fat based on height and weight. Your health care provider can find your BMI and can help  you achieve or maintain a healthy weight.For adults 20 years and older:  A BMI below 18.5 is considered underweight.  A BMI of 18.5 to 24.9 is normal.  A BMI of 25 to 29.9 is considered overweight.  A BMI of 30 and above is considered obese.  Maintain normal blood lipids and cholesterol levels by exercising and minimizing your intake of saturated fat. Eat a balanced diet with plenty of fruit and vegetables. Blood tests for lipids and cholesterol should begin at age 51 and be repeated every 5 years. If your lipid or cholesterol levels are high, you are over 50, or you are at high risk for heart disease, you may need your cholesterol levels checked more frequently.Ongoing high lipid and cholesterol levels should be treated with medicines if diet and exercise are not working.  If you smoke, find out from your health care provider how to quit. If you do not use tobacco, do not start.  Lung cancer screening is recommended for adults aged 20-80 years who are at high risk for developing lung cancer because of a history of smoking. A yearly low-dose CT scan of the lungs is recommended for people who have at least a 30-pack-year history of smoking and are a current smoker or have quit within the past 15 years. A pack year of smoking is smoking an average of 1 pack of cigarettes a day for 1 year (for example: 1 pack a day for 30 years or 2 packs a day for 15 years). Yearly screening  should continue until the smoker has stopped smoking for at least 15 years. Yearly screening should be stopped for people who develop a health problem that would prevent them from having lung cancer treatment.  If you choose to drink alcohol, do not have more than 2 drinks per day. One drink is considered to be 12 ounces (355 mL) of beer, 5 ounces (148 mL) of wine, or 1.5 ounces (44 mL) of liquor.  Avoid use of street drugs. Do not share needles with anyone. Ask for help if you need support or instructions about stopping the  use of drugs.  High blood pressure causes heart disease and increases the risk of stroke. Your blood pressure should be checked at least every 1-2 years. Ongoing high blood pressure should be treated with medicines, if weight loss and exercise are not effective.  If you are 32-6 years old, ask your health care provider if you should take aspirin to prevent heart disease.  Diabetes screening involves taking a blood sample to check your fasting blood sugar level. Testing should be considered at a younger age or be carried out more frequently if you are overweight and have at least 1 risk factor for diabetes.  Colorectal cancer can be detected and often prevented. Most routine colorectal cancer screening begins at the age of 60 and continues through age 37. However, your health care provider may recommend screening at an earlier age if you have risk factors for colon cancer. On a yearly basis, your health care provider may provide home test kits to check for hidden blood in the stool. Use of a small camera at the end of a tube to directly examine the colon (sigmoidoscopy or colonoscopy) can detect the earliest forms of colorectal cancer. Talk to your health care provider about this at age 18, when routine screening begins. Direct exam of the colon should be repeated every 5-10 years through age 61, unless early forms of precancerous polyps or small growths are found.  Hepatitis C blood testing is recommended for all people born from [birth year] through 1963 and any individual with known risks for hepatitis C.  New guidelines recommend a once time screening for HIV.   Screening for abdominal aortic aneurysm (AAA)  by ultrasound is recommended for people who have history of high blood pressure or who are current or former smokers.  Healthy men should  receive prostate-specific antigen (PSA) blood tests as part of routine cancer screening. Talk with your health care provider about prostate cancer  screening.  Testicular cancer screening is  recommended for adult males. Screening includes self-exam, a health care provider exam, and other screening tests. Consult with your health care provider about any symptoms you have or any concerns you have about testicular cancer.  Use sunscreen. Apply sunscreen liberally and repeatedly throughout the day. You should seek shade when your shadow is shorter than you. Protect yourself by wearing long sleeves, pants, a wide-brimmed hat, and sunglasses year round, whenever you are outdoors.  Once a month, do a whole-body skin exam, using a mirror to look at the skin on your back. Tell your health care provider about new moles, moles that have irregular borders, moles that are larger than a pencil eraser, or moles that have changed in shape or color.  Stay current with required vaccines (immunizations).  Influenza vaccine. All adults should be immunized every year.  Tetanus, diphtheria, and acellular pertussis (Td, Tdap) vaccine. An adult who has not previously received Tdap or who does not  know his vaccine status should receive 1 dose of Tdap. This initial dose should be followed by tetanus and diphtheria toxoids (Td) booster doses every 10 years. Adults with an unknown or incomplete history of completing a 3-dose immunization series with Td-containing vaccines should begin or complete a primary immunization series including a Tdap dose. Adults should receive a Td booster every 10 years.  Zoster vaccine. One dose is recommended for adults aged 44 years or older unless certain conditions are present.    PREVNAR - Pneumococcal 13-valent conjugate (PCV13) vaccine. When indicated, a person who is uncertain of his immunization history and has no record of immunization should receive the PCV13 vaccine. An adult aged 63 years or older who has certain medical conditions and has not been previously immunized should receive 1 dose of PCV13 vaccine. This PCV13 should be  followed with a dose of pneumococcal polysaccharide (PPSV23) vaccine. The PPSV23 vaccine dose should be obtained at least 8 weeks after the dose of PCV13 vaccine. An adult aged 61 years or older who has certain medical conditions and previously received 1 or more doses of PPSV23 vaccine should receive 1 dose of PCV13. The PCV13 vaccine dose should be obtained 1 or more years after the last PPSV23 vaccine dose.    PNEUMOVAX - Pneumococcal polysaccharide (PPSV23) vaccine. When PCV13 is also indicated, PCV13 should be obtained first. All adults aged 3 years and older should be immunized. An adult younger than age 71 years who has certain medical conditions should be immunized. Any person who resides in a nursing home or long-term care facility should be immunized. An adult smoker should be immunized. People with an immunocompromised condition and certain other conditions should receive both PCV13 and PPSV23 vaccines. People with human immunodeficiency virus (HIV) infection should be immunized as soon as possible after diagnosis. Immunization during chemotherapy or radiation therapy should be avoided. Routine use of PPSV23 vaccine is not recommended for American Indians, [place] Natives, or people younger than 65 years unless there are medical conditions that require PPSV23 vaccine. When indicated, people who have unknown immunization and have no record of immunization should receive PPSV23 vaccine. One-time revaccination 5 years after the first dose of PPSV23 is recommended for people aged 19-64 years who have chronic kidney failure, nephrotic syndrome, asplenia, or immunocompromised conditions. People who received 1-2 doses of PPSV23 before age 96 years should receive another dose of PPSV23 vaccine at age 77 years or later if at least 5 years have passed since the previous dose. Doses of PPSV23 are not needed for people immunized with PPSV23 at or after age 5 years.    Hepatitis A vaccine. Adults who wish to  be protected from this disease, have certain high-risk conditions, work with hepatitis A-infected animals, work in hepatitis A research labs, or travel to or work in countries with a high rate of hepatitis A should be immunized. Adults who were previously unvaccinated and who anticipate close contact with an international adoptee during the first 60 days after arrival in the [place] from a country with a high rate of hepatitis A should be immunized.    Hepatitis B vaccine. Adults should be immunized if they wish to be protected from this disease, have certain high-risk conditions, may be exposed to blood or other infectious body fluids, are household contacts or sex partners of hepatitis B positive people, are clients or workers in certain care facilities, or travel to or work in countries with a high rate of hepatitis B.  Preventive Service / Frequency   Ages 39 and over  Blood pressure check.  Lipid and cholesterol check.  Lung cancer screening. / Every year if you are aged 35-80 years and have a 30-pack-year history of smoking and currently smoke or have quit within the past 15 years. Yearly screening is stopped once you have quit smoking for at least 15 years or develop a health problem that would prevent you from having lung cancer treatment.  Fecal occult blood test (FOBT) of stool. You may not have to do this test if you get a colonoscopy every 10 years.  Flexible sigmoidoscopy** or colonoscopy.** / Every 5 years for a flexible sigmoidoscopy or every 10 years for a colonoscopy beginning at age 75 and continuing until age 32.  Hepatitis C blood test.** / For all people born from [birth year] through 1963 and any individual with known risks for hepatitis C.  Abdominal aortic aneurysm (AAA) screening./ Screening current or former smokers or have Hypertension.  Skin self-exam. / Monthly.  Influenza vaccine. / Every year.  Tetanus, diphtheria, and acellular pertussis (Tdap/Td)  vaccine.** / 1 dose of Td every 10 years.   Zoster vaccine.** / 1 dose for adults aged 74 years or older.         Pneumococcal 13-valent conjugate (PCV13) vaccine.    Pneumococcal polysaccharide (PPSV23) vaccine.     Hepatitis A vaccine.** / Consult your health care provider.  Hepatitis B vaccine.** / Consult your health care provider. Screening for abdominal aortic aneurysm (AAA)  by ultrasound is recommended for people who have history of high blood pressure or who are current or former smokers.

## 2015-12-11 ENCOUNTER — Other Ambulatory Visit: Payer: Self-pay | Admitting: Physician Assistant

## 2015-12-11 LAB — BASIC METABOLIC PANEL WITH GFR
BUN: 12 mg/dL (ref 7–25)
CALCIUM: 9.4 mg/dL (ref 8.6–10.3)
CHLORIDE: 105 mmol/L (ref 98–110)
CO2: 31 mmol/L (ref 20–31)
Creat: 1.2 mg/dL — ABNORMAL HIGH (ref 0.70–1.18)
GFR, EST AFRICAN AMERICAN: 67 mL/min (ref >=60)
GFR, Est Non African American: 58 mL/min — ABNORMAL LOW (ref >=60)
Glucose, Bld: 90 mg/dL (ref 65–99)
POTASSIUM: 4.1 mmol/L (ref 3.5–5.3)
Sodium: 146 mmol/L (ref 135–146)

## 2015-12-11 LAB — VITAMIN D 25 HYDROXY (VIT D DEFICIENCY, FRACTURES): Vit D, 25-Hydroxy: 79 ng/mL (ref 30–100)

## 2015-12-11 LAB — LIPID PANEL
CHOL/HDL RATIO: 2.3 ratio (ref <=5.0)
CHOLESTEROL: 145 mg/dL (ref 125–200)
HDL: 62 mg/dL (ref >=40)
LDL CALC: 65 mg/dL (ref <130)
TRIGLYCERIDES: 88 mg/dL (ref <150)
VLDL: 18 mg/dL (ref <30)

## 2015-12-11 LAB — HEPATIC FUNCTION PANEL
ALK PHOS: 67 U/L (ref 40–115)
ALT: 19 U/L (ref 9–46)
AST: 17 U/L (ref 10–35)
Albumin: 4 g/dL (ref 3.6–5.1)
BILIRUBIN DIRECT: 0.2 mg/dL (ref <=0.2)
BILIRUBIN INDIRECT: 0.5 mg/dL (ref 0.2–1.2)
BILIRUBIN TOTAL: 0.7 mg/dL (ref 0.2–1.2)
TOTAL PROTEIN: 6.7 g/dL (ref 6.1–8.1)

## 2015-12-11 LAB — MAGNESIUM: Magnesium: 2 mg/dL (ref 1.5–2.5)

## 2015-12-11 LAB — TSH: TSH: 1.82 mIU/L (ref 0.40–4.50)

## 2015-12-26 ENCOUNTER — Other Ambulatory Visit: Payer: Self-pay | Admitting: Internal Medicine

## 2016-01-09 ENCOUNTER — Other Ambulatory Visit: Payer: Self-pay | Admitting: Internal Medicine

## 2016-01-09 NOTE — Telephone Encounter (Signed)
Rx called into [facility].

## 2016-01-20 ENCOUNTER — Ambulatory Visit: Payer: PPO | Admitting: Neurology

## 2016-02-17 ENCOUNTER — Other Ambulatory Visit: Payer: Self-pay | Admitting: Internal Medicine

## 2016-03-01 ENCOUNTER — Other Ambulatory Visit: Payer: Self-pay | Admitting: Internal Medicine

## 2016-03-04 ENCOUNTER — Encounter: Payer: Self-pay | Admitting: Neurology

## 2016-03-04 ENCOUNTER — Ambulatory Visit (INDEPENDENT_AMBULATORY_CARE_PROVIDER_SITE_OTHER): Payer: PPO | Admitting: Neurology

## 2016-03-04 VITALS — BP 130/68 | HR 80 | Resp 16 | Ht 71.0 in | Wt 164.0 lb

## 2016-03-04 DIAGNOSIS — G214 Vascular parkinsonism: Secondary | ICD-10-CM

## 2016-03-04 DIAGNOSIS — R413 Other amnesia: Secondary | ICD-10-CM | POA: Diagnosis not present

## 2016-03-04 NOTE — Patient Instructions (Addendum)
We can continue with your amantadine, you have very mild signs of parkinsonism.   We will monitor your memory as well.

## 2016-03-04 NOTE — Progress Notes (Signed)
Subjective:    Patient ID: John [name] is a 81 y.o. male.  HPI     Interim history:   John [name] is a 81 year old right-handed gentleman with an underlying medical history of hypertension, hyperlipidemia, remote history of smoking, current history of chewing tobacco, type 2 diabetes, vitamin D deficiency, coronary artery disease, status post CABG (two-vessel, 1996), reflux disease, depression, chronic kidney disease, history of TIA, asthma, COPD, history of kidney stones, history of SVT, arthritis and obstructive sleep apnea, who presents for follow-up consultation of John [name] parkinsonism. He is unaccompanied today. I last saw John [name] on 11/17/2015, at which time he reported No new symptoms and no new complaints. He was taking amantadine 100 mg twice daily, he was trying to hydrate well. He had no recent falls. He felt John [name] memory was not as good since John [name] hospital stay in November 2016 but had no other specific new memory related complaints. He lives with John [name] wife. John [name] exam was fairly stable. We mutually agreed to maintain John [name] on amantadine 100 mg twice daily, exam was not in keeping with idiopathic Parkinson's disease.  Today, 03/04/2016: He reports feeling mostly stable, some memory issues. No driving issue, no disorientation, no hallucinations, but some word finding and enunciation problems, still trouble with sleep, going and staying asleep. He was briefly tried serax but it did not help, back on Seroquel 25 mg qHS and 1 mg of Xanax. No recent falls, has had some interim stressor with wife's medical issues.   Previously:   I first met John [name] on 09/22/2015 after a fall and a recent hospitalization for this. I kept John [name] on amantadine 100 mg twice daily at the time.  09/22/2015: He was recently admitted to the hospital on 09/10/2015 secondary to encephalopathy. He was discharged on 09/11/2015 and I reviewed the hospital records including the discharge summary. He was admitted from the emergency room  after a fall at home. John [name] wife was unable to help John [name] after John [name] fall. He rolled out of bed. He had recurrent falls at home prior to that. CT head without contrast in the emergency room on 09/10/2015 showed: Stable examination with stable periventricular white matter disease and hypodensities in the basal ganglia. No acute posttraumatic findings.  Postadmission he had a brain MRI without contrast on 09/10/2015: Atrophy and chronic microvascular ischemia.  No acute abnormality. In addition, I personally reviewed the images through the PACS system. I agree with the findings.  During John [name] hospitalization, neurology was consulted and suggested he start amantadine 100 mg twice daily for parkinsonism.   He was discharged to home with home health physical therapy which he is currently enrolled in.    I reviewed your office note from 09/04/2015.  He has had a tremor in both hands for a few months, some memory loss. John [name] daughter, [name], reports that he has had some instances of missing a turn recently. He is currently not driving but would like to go back to driving. He has had a history of orthostatic hypotension with improvement after he was started on Florinef several months ago. He has no family history of Parkinson's disease. He feels that the amantadine has helped. He denies any side effects. He has a history of problems sleeping at night and has been on Xanax at night for quite some time. About a year ago he was started on Seroquel at night. He has no history of dream enactments.  John [name] Past Medical History Is Significant For: Past Medical History  Diagnosis Date  .  TIA   . HYPERLIPIDEMIA   . CAD, ARTERY BYPASS GRAFT   . SUPRAVENTRICULAR TACHYCARDIA, HX OF   . Leukodystrophy   . GASTROESOPHAGEAL REFLUX DISEASE   . ASTHMA   . Diverticulosis   . Insomnia   . Anxiety   . Blood transfusion     "related to OHS, ulcers"  . Ulcer   . Colon polyps     adenomatous polyps  . COPD   . Sleep apnea      "don't always wear it" (03/20/2014)  . Arthritis     "lower back; left ankle" (03/20/2014)  . Nephrolithiasis     "I've had over 320 kidney stones" (03/20/2014)    John [name] Past Surgical History Is Significant For: Past Surgical History  Procedure Laterality Date  . Cardiac catheterization    . Gastrectomy    . Vagotomy    . Cystourethroscopy with stent removal.    . Irrigation and debridement sebaceous cyst      "off my back  . Colonoscopy    . Upper gastrointestinal endoscopy    . Excisional hemorrhoidectomy    . Polypectomy    . Coronary artery bypass graft  1996    CABG X2  . Prostate surgery      "took the center of my prostate out"  . Cystoscopy with ureteroscopy, stone basketry and stent placement    . Ankle fracture surgery Left ~ 2012  . Hiatal hernia repair      John [name] Family History Is Significant For: Family History  Problem Relation Age of Onset  . Heart disease Brother   . Diabetes Brother   . Alcohol abuse Father   . Heart disease Paternal Grandmother   . Colon cancer Neg Hx   . Stomach cancer Neg Hx   . Stroke Mother     John [name] Social History Is Significant For: Social History   Social History  . Marital Status: Married    Spouse Name: N/A  . Number of Children: 2  . Years of Education: 14   Occupational History  . 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 Smoking status: Former Smoker -- 0.50 packs/day    Types: Cigarettes    Quit date: 10/25/1962  . Smokeless tobacco: Current User    Types: Chew  . Alcohol Use: No  . Drug Use: No  . Sexual Activity: Yes   Other Topics Concern  . None   Social History Narrative   Denies caffeine use     John [name] Allergies Are:  Allergies  Allergen Reactions  . Gabapentin Other (See Comments)    Severe confusion  . Prozac [Fluoxetine Hcl] Other (See Comments)    Patient state that it does not agree with John [name]  . Soma [Carisoprodol] Other (See Comments)    Patient stated that it does  not agree with John [name]  . Beta Adrenergic Blockers Other (See Comments)    REACTION: Bradycardia  :   John [name] Current Medications Are:  Outpatient Encounter Prescriptions as of 03/04/2016  Medication Sig  . ALPRAZolam (XANAX) 1 MG tablet TAKE ONE TABLET BY MOUTH THREE TIMES DAILY AS NEEDED FOR ANXIETY  . amantadine (SYMMETREL) 100 MG capsule Take 1 capsule (100 mg total) by mouth 2 (two) times daily.  [name] aspirin 81 MG tablet Take 81 mg by mouth daily.    . Cholecalciferol (VITAMIN D3) 2000 UNITS capsule Take 8,000 Units by mouth daily.   . clopidogrel (PLAVIX) 75 MG tablet Take 1  tablet (75 mg total) by mouth daily.  . fludrocortisone (FLORINEF) 0.1 MG tablet Take 1 tablet (0.1 mg total) by mouth 2 (two) times daily.  [name] FREESTYLE LITE test strip TEST twice a day  . Lancets (FREESTYLE) lancets 1 each by Other route as needed for other. Use as instructed  . loperamide (IMODIUM) 2 MG capsule Take 2 mg by mouth as needed for diarrhea or loose stools.  . Melatonin 5 MG CAPS Take 5 mg by mouth at bedtime.  . nitroGLYCERIN (NITROSTAT) 0.4 MG SL tablet Sig: 1 tablet under tongue every 3 to 5 minutes as needed for Angina - Please Dispense # 2 bottles of # 25 tabs  . oxazepam (SERAX) 30 MG capsule Take 1 capsule (30 mg total) by mouth at bedtime as needed for sleep or anxiety.  . pantoprazole (PROTONIX) 40 MG tablet TAKE ONE TABLET BY MOUTH ONCE DAILY  . potassium citrate (UROCIT-K) 10 MEQ (1080 MG) SR tablet Take 1 tablet (10 mEq total) by mouth 2 (two) times daily.  . prochlorperazine (COMPAZINE) 5 MG tablet Take 1-2 tablets TID PRN for nausea. (Patient taking differently: Take 5 mg by mouth every 8 (eight) hours as needed for nausea. )  . QUEtiapine (SEROQUEL) 25 MG tablet TAKE ONE TABLET BY MOUTH THREE TIMES DAILY  . simvastatin (ZOCOR) 80 MG tablet Take 1 tablet (80 mg total) by mouth at bedtime. (Patient taking differently: Take 40 mg by mouth at bedtime. )  . VIGAMOX 0.5 % ophthalmic solution Place 1  drop into both eyes daily as needed (INFECTION(S)).    No facility-administered encounter medications on file as of 03/04/2016.  :  Review of Systems:  Out of a complete 14 point review of systems, all are reviewed and negative with the exception of these symptoms as listed below:   Review of Systems  Neurological:       Patient reports that he is doing the same as last visit. States that he has memory problems.     Objective:  Neurologic Exam  Physical Exam Physical Examination:   Filed Vitals:   03/04/16 1445  BP: 130/68  Pulse: 80  Resp: 16    General Examination: The patient is a very pleasant 81 y.o. male in no acute distress. He is in good spirits today.  HEENT: Normocephalic, atraumatic, pupils are equal, round and reactive to light and accommodation. He is status post bilateral cataract repairs. Extraocular tracking shows mild saccadic breakdown without nystagmus noted. There is limitation to upper gaze. There is no significant decrease in eye blink rate. Hearing is intact. Face is symmetric with no significant facial masking and normal facial sensation. There is no lip, neck or jaw tremor. Neck is mildly rigid with intact passive ROM. There are no carotid bruits on auscultation. Oropharynx exam reveals moderate mouth dryness and moderate airway crowding noted. Mallampati is class II. Tongue protrudes centrally and palate elevates symmetrically.   Chest: is clear to auscultation without wheezing, rhonchi or crackles noted.  Heart: sounds are regular and normal without murmurs, rubs or gallops noted.   Abdomen: is soft, non-tender and non-distended with normal bowel sounds appreciated on auscultation.  Extremities: There is no pitting edema in the distal lower extremities bilaterally. Pedal pulses are intact.   Skin: is warm and dry with no trophic changes noted.    Musculoskeletal: exam reveals no obvious joint deformities, tenderness, joint swelling or  erythema.  Neurologically:  Mental status: The patient is awake and alert, paying good  attention. He is able to give a fairly good history. John [name] memory, attention, language and knowledge are very mildly impaired. There is no aphasia, agnosia, apraxia or anomia. There is a mild degree of bradyphrenia. Speech is mildly hypophonic with no dysarthria noted. Mood is congruent and affect is normal.   On 03/04/2016: MMSE 28/30, CDT: 4/4, AFT: 15/min.  Cranial nerves are as described above under HEENT exam. In addition, shoulder shrug is normal with equal shoulder height noted.  Motor exam: Normal bulk, and strength for age is noted. There are no dyskinesias noted.  Tone is very mildly rigid. There is no significant evidence of cogwheeling. There is no resting tremor. He has a minimal postural tremor in the left upper extremity only. He has mild difficulty with fine motor testing in the upper extremities and lower extremities. Mild lateralization to left is noted today. he stands up with mild difficulty and pushes himself up. Posture is age-appropriate. He walks with fairly preserved arm swing, maybe slightly less on the left. He is slightly insecure with John [name] turns. Tandem walk is not possible for John [name]. Sensory exam is intact in the upper and lower extremities bilaterally. Reflexes are about 1+ throughout, absent in the ankles.   Assessment and Plan:   In summary, John [name] is a very pleasant 81 year old male with an underlying medical history of hypertension, hyperlipidemia, very remote history of smoking, current history of chewing tobacco, type 2 diabetes, vitamin D deficiency, coronary artery disease, status post CABG (two-vessel, 1996), reflux disease, depression, chronic kidney disease, history of TIA, asthma, COPD, history of kidney stones, history of SVT, arthritis and obstructive sleep apnea, who presents for follow up consultation of John [name] parkinsonism. He was hospitalized in November 2016 and  was noted to have some signs of parkinsonism. He was placed on amantadine 100 mg twice daily and felt improved and we maintained the medication when I first met John [name] on 09/22/2015. We will continue with John [name] medication at this time. He has no significant signs or symptoms of idiopathic Parkinson's disease. 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 John [name] head CT and brain MRI showed white matter changes including hypodensities in both basal ganglia. He is at risk for vascular parkinsonism and vascular dementia. He has multiple vascular risk factors. I talked to John [name] about the importance of staying well hydrated and physical exercise. He had home health physical therapy. Memory scores a fairly good, we will continue to monitor. He has mild symptoms and signs of parkinsonism, maybe slight lateralization to the left, but overall no telltale signs of idiopathic Parkinson's disease. He was tried on oxazepam for sleep recently by John [name] primary care physician but is back on 25 mg of Seroquel at night and 1 mg of Xanax at night. He is advised to stay active mentally and physically. He did not need a refill on John [name] amantadine today. I will see John [name] back routinely in 6 months, sooner if needed, I answered all John [name] questions today and he was in agreement. I spent 25 minutes in total face-to-face time with the patient, more than 50% of which was spent in counseling and coordination of care, reviewing test results, reviewing medication and discussing or reviewing the diagnosis of parkinsonism, its prognosis and treatment options.

## 2016-03-11 ENCOUNTER — Encounter: Payer: Self-pay | Admitting: Internal Medicine

## 2016-03-11 ENCOUNTER — Ambulatory Visit (INDEPENDENT_AMBULATORY_CARE_PROVIDER_SITE_OTHER): Payer: PPO | Admitting: Internal Medicine

## 2016-03-11 ENCOUNTER — Other Ambulatory Visit: Payer: Self-pay | Admitting: *Deleted

## 2016-03-11 VITALS — BP 130/82 | HR 76 | Temp 97.3°F | Resp 16 | Ht 71.0 in | Wt 162.2 lb

## 2016-03-11 DIAGNOSIS — E782 Mixed hyperlipidemia: Secondary | ICD-10-CM | POA: Diagnosis not present

## 2016-03-11 DIAGNOSIS — I10 Essential (primary) hypertension: Secondary | ICD-10-CM

## 2016-03-11 DIAGNOSIS — Z79899 Other long term (current) drug therapy: Secondary | ICD-10-CM

## 2016-03-11 DIAGNOSIS — E1122 Type 2 diabetes mellitus with diabetic chronic kidney disease: Secondary | ICD-10-CM | POA: Diagnosis not present

## 2016-03-11 DIAGNOSIS — E559 Vitamin D deficiency, unspecified: Secondary | ICD-10-CM | POA: Diagnosis not present

## 2016-03-11 DIAGNOSIS — K219 Gastro-esophageal reflux disease without esophagitis: Secondary | ICD-10-CM | POA: Diagnosis not present

## 2016-03-11 DIAGNOSIS — E1129 Type 2 diabetes mellitus with other diabetic kidney complication: Secondary | ICD-10-CM | POA: Diagnosis not present

## 2016-03-11 DIAGNOSIS — N182 Chronic kidney disease, stage 2 (mild): Secondary | ICD-10-CM | POA: Diagnosis not present

## 2016-03-11 LAB — CBC WITH DIFFERENTIAL/PLATELET
BASOS ABS: 41 {cells}/uL (ref 0–200)
BASOS PCT: 1 %
EOS ABS: 205 {cells}/uL (ref 15–500)
Eosinophils Relative: 5 %
HEMATOCRIT: 42.3 % (ref 38.5–50.0)
HEMOGLOBIN: 14.4 g/dL (ref 13.2–17.1)
LYMPHS ABS: 1722 {cells}/uL (ref 850–3900)
Lymphocytes Relative: 42 %
MCH: 31.9 pg (ref 27.0–33.0)
MCHC: 34 g/dL (ref 32.0–36.0)
MCV: 93.6 fL (ref 80.0–100.0)
MONO ABS: 328 {cells}/uL (ref 200–950)
MPV: 9.5 fL (ref 7.5–12.5)
Monocytes Relative: 8 %
NEUTROS ABS: 1804 {cells}/uL (ref 1500–7800)
Neutrophils Relative %: 44 %
Platelets: 195 10*3/uL (ref 140–400)
RBC: 4.52 MIL/uL (ref 4.20–5.80)
RDW: 14.4 % (ref 11.0–15.0)
WBC: 4.1 10*3/uL (ref 3.8–10.8)

## 2016-03-11 LAB — BASIC METABOLIC PANEL WITH GFR
BUN: 15 mg/dL (ref 7–25)
CHLORIDE: 109 mmol/L (ref 98–110)
CO2: 27 mmol/L (ref 20–31)
CREATININE: 1.21 mg/dL — AB (ref 0.70–1.18)
Calcium: 8.9 mg/dL (ref 8.6–10.3)
GFR, Est African American: 65 mL/min (ref >=60)
GFR, Est Non African American: 57 mL/min — ABNORMAL LOW (ref >=60)
Glucose, Bld: 100 mg/dL — ABNORMAL HIGH (ref 65–99)
POTASSIUM: 4 mmol/L (ref 3.5–5.3)
Sodium: 145 mmol/L (ref 135–146)

## 2016-03-11 LAB — LIPID PANEL
CHOL/HDL RATIO: 1.9 ratio (ref <=5.0)
Cholesterol: 126 mg/dL (ref 125–200)
HDL: 65 mg/dL (ref >=40)
LDL CALC: 49 mg/dL (ref <130)
Triglycerides: 58 mg/dL (ref <150)
VLDL: 12 mg/dL (ref <30)

## 2016-03-11 LAB — HEPATIC FUNCTION PANEL
ALBUMIN: 4 g/dL (ref 3.6–5.1)
ALK PHOS: 55 U/L (ref 40–115)
ALT: 22 U/L (ref 9–46)
AST: 24 U/L (ref 10–35)
BILIRUBIN DIRECT: 0.2 mg/dL (ref <=0.2)
Indirect Bilirubin: 0.6 mg/dL (ref 0.2–1.2)
Total Bilirubin: 0.8 mg/dL (ref 0.2–1.2)
Total Protein: 6.6 g/dL (ref 6.1–8.1)

## 2016-03-11 LAB — HEMOGLOBIN A1C
HEMOGLOBIN A1C: 5.6 % (ref <5.7)
Mean Plasma Glucose: 114 mg/dL

## 2016-03-11 LAB — MAGNESIUM: MAGNESIUM: 1.9 mg/dL (ref 1.5–2.5)

## 2016-03-11 LAB — TSH: TSH: 1.3 m[IU]/L (ref 0.40–4.50)

## 2016-03-11 MED ORDER — NITROGLYCERIN 0.4 MG SL SUBL: SUBLINGUAL_TABLET | SUBLINGUAL | Status: DC

## 2016-03-11 NOTE — Progress Notes (Signed)
Patient ID: John [name], male   DOB: 1934-10-30, 81 y.o.   MRN: [id number]  [facility]                       Unk Pinto, M.D.        Uvaldo Bristle. Silverio Lay, P.A.-C       Starlyn Skeans, P.A.-C   [facility]                [address] [place], [place] [id number] Telephone [phone] Telefax [phone] _________________________________________________________________________   This very nice 81 y.o. MWM presents for 79 month follow up with Hypertension, ASCAD/CABG, ASCVD/hx/o TIA, Hyperlipidemia, Pre-Diabetes, and Vitamin D Deficiency. Patient also has a question of mild parkinsonism possibly related to Rx Seroquel for his refractory insomnia.    Patient is treated for HTN and has diabetic related Dysautonomia with orthostatic Hypotension compensated with Florinef & BP has been controlled at home.  In 1996 , he underwent CABG and has done well since. Today's BP: 130/82 mmHg. Patient has had no complaints of any cardiac type chest pain, palpitations, dyspnea/orthopnea/PND, dizziness, claudication, or dependent edema. He also has hx/o GERD controlled on current meds. Further he has hx/o recurrent kidney stones (>200+).    Hyperlipidemia is controlled with diet & meds. Patient denies myalgias or other med SE's. Last Lipids were Cholesterol 145; HDL 62; LDL 65; Triglycerides 88 on 12/10/2015.   Also, the patient has history of T2_DM since 1999 improving to PreDiabetes with weight loss and has had no symptoms of reactive hypoglycemia, diabetic polys, paresthesias or visual blurring.  Last A1c was 5.4% on 09/10/2015.   Further, the patient also has history of Vitamin D Deficiency of "18" in 2008  and supplements vitamin D without any suspected side-effects. Last vitamin D was  79 on 12/10/2015.     Medication Sig  . ALPRAZolam 1 MG tablet TAKE ONE TABLET BY MOUTH THREE TIMES DAILY AS NEEDED  . amantadine   100 MG  Take 1 capsule (100 mg total) by mouth 2 (two) times daily.  [name] aspirin 81 MG  Take 81 mg by mouth daily.    [name] VITAMIN D 2000 UNITS  Take 8,000 Units by mouth daily.   . clopidogrel  75 MG Take 1 tablet (75 mg total) by mouth daily.  [name] FLORINEF 0.1 MG  Take 1 tablet (0.1 mg total) by mouth 2 (two) times daily.  . Loperamide  2 MG Take 2 mg by mouth as needed for diarrhea or loose stools.  . Melatonin 5 MG  Take 5 mg by mouth at bedtime.  [name] NITROSTAT 0.4 MG SL  Sig: 1 tablet under tongue every 3 to 5 minutes as needed for Angina   . oxazepam 30 MG  Take 1 capsule (30 mg total) by mouth at bedtime as needed for sleep or anxiety.  . pantoprazole  40 MG  TAKE ONE TABLET BY MOUTH ONCE DAILY  . UROCIT-K 10 MEQ  SR  Take 1 tablet (10 mEq total) by mouth 2 (two) times daily.  . prochlorperazine  5 MG  Take 1-2 tablets TID PRN for nausea.  [name] QUEtiapine  25 MG TAKE ONE TABLET BY MOUTH THREE TIMES DAILY- take at Bedtime  . simvastatin  80 MG tablet Take 40 mg by mouth at bedtime.  - takes 1/2 tab 3  x/week  . VIGAMOX 0.5 % ophthalmic soln Place 1 drop into both eyes daily as needed (INFECTION(S)).    Allergies  Allergen Reactions  . Gabapentin Other (See Comments)    Severe confusion  . Prozac [Fluoxetine Hcl] Other (See Comments)    Patient state that it does not agree with him  . Soma [Carisoprodol] Other (See Comments)    Patient stated that it does not agree with him  . Beta Adrenergic Blockers Other (See Comments)    REACTION: Bradycardia    PMHx:   Past Medical History  Diagnosis Date  . TIA   . HYPERLIPIDEMIA   . CAD, ARTERY BYPASS GRAFT   . SUPRAVENTRICULAR TACHYCARDIA, HX OF   . Leukodystrophy   . GASTROESOPHAGEAL REFLUX DISEASE   . ASTHMA   . Diverticulosis   . Insomnia   . Anxiety   . Blood transfusion     "related to OHS, ulcers"  . Ulcer   . Colon polyps     adenomatous polyps  . COPD   . Sleep apnea     "don't always wear it" (03/20/2014)  . Arthritis      "lower back; left ankle" (03/20/2014)  . Nephrolithiasis     "I've had over 320 kidney stones" (03/20/2014)   Immunization History  Administered Date(s) Administered  . Influenza, High Dose Seasonal PF 08/13/2013, 08/05/2014, 07/23/2015  . Pneumococcal Conjugate-13 08/27/2014  . Pneumococcal Polysaccharide-23 08/06/2012  . Tdap 08/01/2013  . Zoster 05/02/2006   Past Surgical History  Procedure Laterality Date  . Cardiac catheterization    . Gastrectomy    . Vagotomy    . Cystourethroscopy with stent removal.    . Irrigation and debridement sebaceous cyst      "off my back  . Colonoscopy    . Upper gastrointestinal endoscopy    . Excisional hemorrhoidectomy    . Polypectomy    . Coronary artery bypass graft  1996    CABG X2  . Prostate surgery      "took the center of my prostate out"  . Cystoscopy with ureteroscopy, stone basketry and stent placement    . Ankle fracture surgery Left ~ 2012  . Hiatal hernia repair     FHx:    Reviewed / unchanged  SHx:    Reviewed / unchanged  Systems Review:  Constitutional: Denies fever, chills, wt changes, headaches, insomnia, fatigue, night sweats, change in appetite. Eyes: Denies redness, blurred vision, diplopia, discharge, itchy, watery eyes.  ENT: Denies discharge, congestion, post nasal drip, epistaxis, sore throat, earache, hearing loss, dental pain, tinnitus, vertigo, sinus pain, snoring.  CV: Denies chest pain, palpitations, irregular heartbeat, syncope, dyspnea, diaphoresis, orthopnea, PND, claudication or edema. Respiratory: denies cough, dyspnea, DOE, pleurisy, hoarseness, laryngitis, wheezing.  Gastrointestinal: Denies dysphagia, odynophagia, heartburn, reflux, water brash, abdominal pain or cramps, nausea, vomiting, bloating, diarrhea, constipation, hematemesis, melena, hematochezia  or hemorrhoids. Genitourinary: Denies dysuria, frequency, urgency, nocturia, hesitancy, discharge, hematuria or flank pain. Musculoskeletal:  Denies arthralgias, myalgias, stiffness, jt. swelling, pain, limping or strain/sprain.  Skin: Denies pruritus, rash, hives, warts, acne, eczema or change in skin lesion(s). Neuro: No weakness, tremor, incoordination, spasms, paresthesia or pain. Psychiatric: Denies confusion, memory loss or sensory loss. Endo: Denies change in weight, skin or hair change.  Heme/Lymph: No excessive bleeding, bruising or enlarged lymph nodes.  Physical Exam  BP 130/82 mmHg  Pulse 76  Temp(Src) 97.3 F (36.3 C)  Resp 16  Ht 5\' 11"  (1.803 m)  Wt 162 lb 3.2  oz (73.573 kg)  BMI 22.63 kg/m2  Appears well nourished and in no distress. Eyes: PERRLA, EOMs, conjunctiva no swelling or erythema. Sinuses: No frontal/maxillary tenderness ENT/Mouth: EAC's clear, TM's nl w/o erythema, bulging. Nares clear w/o erythema, swelling, exudates. Oropharynx clear without erythema or exudates. Oral hygiene is good. Tongue normal, non obstructing. Hearing intact.  Neck: Supple. Thyroid nl. Car 2+/2+ without bruits, nodes or JVD. Chest: Respirations nl with BS clear & equal w/o rales, rhonchi, wheezing or stridor.  Cor: Heart sounds normal w/ regular rate and rhythm without sig. murmurs, gallops, clicks, or rubs. Peripheral pulses normal and equal  without edema.  Abdomen: Soft & bowel sounds normal. Non-tender w/o guarding, rebound, hernias, masses, or organomegaly.  Lymphatics: Unremarkable.  Musculoskeletal: Full ROM all peripheral extremities, joint stability, 5/5 strength, and normal gait.  Skin: Warm, dry without exposed rashes, lesions or ecchymosis apparent.  Neuro: Cranial nerves intact, reflexes equal bilaterally. Sensory-motor testing grossly intact. Tendon reflexes grossly intact.  Pysch: Alert & oriented x 3.  Insight and judgement nl & appropriate. No ideations.  Assessment and Plan:  1. Essential hypertension  - TSH  2. Mixed hyperlipidemia  - Lipid panel - TSH  3. Type 2 diabetes mellitus with stage 2  chronic kidney disease, without long-term current use of insulin (HCC)  - Hemoglobin A1c - Insulin, random  4. Vitamin D deficiency  - VITAMIN D 25 Hydroxy   5. Gastroesophageal reflux disease   6. Medication management  - CBC with Differential/Platelet - BASIC METABOLIC PANEL WITH GFR - Hepatic function panel - Magnesium   Recommended regular exercise, BP monitoring, weight control, and discussed med and SE's. Recommended labs to assess and monitor clinical status. Further disposition pending results of labs. Over 30 minutes of exam, counseling, chart review was performed

## 2016-03-11 NOTE — Patient Instructions (Signed)

## 2016-03-12 ENCOUNTER — Encounter: Payer: Self-pay | Admitting: Internal Medicine

## 2016-03-12 LAB — INSULIN, RANDOM: Insulin: 7.1 u[IU]/mL (ref 2.0–19.6)

## 2016-03-12 LAB — VITAMIN D 25 HYDROXY (VIT D DEFICIENCY, FRACTURES): VIT D 25 HYDROXY: 83 ng/mL (ref 30–100)

## 2016-03-16 ENCOUNTER — Ambulatory Visit: Payer: PPO | Admitting: Neurology

## 2016-05-11 ENCOUNTER — Other Ambulatory Visit: Payer: Self-pay | Admitting: Internal Medicine

## 2016-05-27 ENCOUNTER — Other Ambulatory Visit: Payer: Self-pay | Admitting: Internal Medicine

## 2016-05-27 DIAGNOSIS — N200 Calculus of kidney: Secondary | ICD-10-CM

## 2016-05-27 DIAGNOSIS — E785 Hyperlipidemia, unspecified: Secondary | ICD-10-CM

## 2016-05-27 DIAGNOSIS — I2510 Atherosclerotic heart disease of native coronary artery without angina pectoris: Secondary | ICD-10-CM

## 2016-06-01 ENCOUNTER — Other Ambulatory Visit: Payer: Self-pay | Admitting: Internal Medicine

## 2016-06-22 ENCOUNTER — Encounter: Payer: Self-pay | Admitting: Physician Assistant

## 2016-06-22 ENCOUNTER — Ambulatory Visit (INDEPENDENT_AMBULATORY_CARE_PROVIDER_SITE_OTHER): Payer: PPO | Admitting: Physician Assistant

## 2016-06-22 VITALS — BP 116/72 | HR 58 | Temp 97.6°F | Resp 14 | Ht 71.0 in | Wt 164.2 lb

## 2016-06-22 DIAGNOSIS — N182 Chronic kidney disease, stage 2 (mild): Secondary | ICD-10-CM

## 2016-06-22 DIAGNOSIS — Z23 Encounter for immunization: Secondary | ICD-10-CM | POA: Diagnosis not present

## 2016-06-22 DIAGNOSIS — E1122 Type 2 diabetes mellitus with diabetic chronic kidney disease: Secondary | ICD-10-CM | POA: Diagnosis not present

## 2016-06-22 DIAGNOSIS — E782 Mixed hyperlipidemia: Secondary | ICD-10-CM | POA: Diagnosis not present

## 2016-06-22 DIAGNOSIS — E559 Vitamin D deficiency, unspecified: Secondary | ICD-10-CM

## 2016-06-22 DIAGNOSIS — Z79899 Other long term (current) drug therapy: Secondary | ICD-10-CM | POA: Diagnosis not present

## 2016-06-22 DIAGNOSIS — I10 Essential (primary) hypertension: Secondary | ICD-10-CM

## 2016-06-22 DIAGNOSIS — I25700 Atherosclerosis of coronary artery bypass graft(s), unspecified, with unstable angina pectoris: Secondary | ICD-10-CM | POA: Diagnosis not present

## 2016-06-22 DIAGNOSIS — J449 Chronic obstructive pulmonary disease, unspecified: Secondary | ICD-10-CM | POA: Diagnosis not present

## 2016-06-22 LAB — CBC WITH DIFFERENTIAL/PLATELET
Basophils Absolute: 37 cells/uL (ref 0–200)
Basophils Relative: 1 %
EOS PCT: 4 %
Eosinophils Absolute: 148 cells/uL (ref 15–500)
HCT: 41.5 % (ref 38.5–50.0)
Hemoglobin: 14.2 g/dL (ref 13.2–17.1)
LYMPHS ABS: 1332 {cells}/uL (ref 850–3900)
LYMPHS PCT: 36 %
MCH: 32.1 pg (ref 27.0–33.0)
MCHC: 34.2 g/dL (ref 32.0–36.0)
MCV: 93.9 fL (ref 80.0–100.0)
MONOS PCT: 9 %
MPV: 9.8 fL (ref 7.5–12.5)
Monocytes Absolute: 333 cells/uL (ref 200–950)
NEUTROS PCT: 50 %
Neutro Abs: 1850 cells/uL (ref 1500–7800)
PLATELETS: 174 10*3/uL (ref 140–400)
RBC: 4.42 MIL/uL (ref 4.20–5.80)
RDW: 13.7 % (ref 11.0–15.0)
WBC: 3.7 10*3/uL — AB (ref 3.8–10.8)

## 2016-06-22 LAB — LIPID PANEL
CHOL/HDL RATIO: 2.2 ratio (ref <=5.0)
Cholesterol: 143 mg/dL (ref 125–200)
HDL: 64 mg/dL (ref >=40)
LDL Cholesterol: 64 mg/dL (ref <130)
Triglycerides: 74 mg/dL (ref <150)
VLDL: 15 mg/dL (ref <30)

## 2016-06-22 LAB — BASIC METABOLIC PANEL WITH GFR
BUN: 11 mg/dL (ref 7–25)
CALCIUM: 9.6 mg/dL (ref 8.6–10.3)
CO2: 30 mmol/L (ref 20–31)
CREATININE: 1.22 mg/dL — AB (ref 0.70–1.18)
Chloride: 107 mmol/L (ref 98–110)
GFR, EST NON AFRICAN AMERICAN: 56 mL/min — AB (ref >=60)
GFR, Est African American: 65 mL/min (ref >=60)
GLUCOSE: 99 mg/dL (ref 65–99)
Potassium: 4.6 mmol/L (ref 3.5–5.3)
SODIUM: 142 mmol/L (ref 135–146)

## 2016-06-22 LAB — HEPATIC FUNCTION PANEL
ALBUMIN: 3.8 g/dL (ref 3.6–5.1)
ALT: 17 U/L (ref 9–46)
AST: 16 U/L (ref 10–35)
Alkaline Phosphatase: 45 U/L (ref 40–115)
Bilirubin, Direct: 0.2 mg/dL (ref <=0.2)
Indirect Bilirubin: 0.5 mg/dL (ref 0.2–1.2)
TOTAL PROTEIN: 6.3 g/dL (ref 6.1–8.1)
Total Bilirubin: 0.7 mg/dL (ref 0.2–1.2)

## 2016-06-22 LAB — MAGNESIUM: MAGNESIUM: 1.7 mg/dL (ref 1.5–2.5)

## 2016-06-22 LAB — TSH: TSH: 1.77 m[IU]/L (ref 0.40–4.50)

## 2016-06-22 NOTE — Progress Notes (Signed)
Patient ID: John [name], male   DOB: 22-Jan-1935, 81 y.o.   MRN: [id number]  Assessment and Plan:  Hypertension:  -Continue medication,  -monitor blood pressure at home.  -Continue DASH diet.   -Reminder to go to the ER if any CP, SOB, nausea, dizziness, severe HA, changes vision/speech, left arm numbness and tingling, and jaw pain.  Cholesterol: -Continue diet and exercise.  -Check cholesterol.   Diabetes resolved with weight loss -Continue diet and exercise.   Vitamin D Def: -check level -continue medications.   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 Further disposition pending results of labs.  HPI 81 y.o. male  presents for 3 month follow up with hypertension, hyperlipidemia, prediabetes and vitamin D.   His blood pressure has been controlled at home, today their BP is BP: 116/72.   He does workout. He denies chest pain, shortness of breath, dizziness.  He reports that this is his normal blood pressure.  He reports that he is not walking as often.     He is on cholesterol medication and denies myalgias. His cholesterol is at goal. The cholesterol last visit was:   Lab Results  Component Value Date   CHOL 126 03/11/2016   HDL 65 03/11/2016   LDLCALC 49 03/11/2016   TRIG 58 03/11/2016   CHOLHDL 1.9 03/11/2016    He has been working on diet and exercise for prediabetes, and denies foot ulcerations, hyperglycemia, hypoglycemia , increased appetite, nausea, paresthesia of the feet, polydipsia, polyuria, visual disturbances, vomiting and weight loss. Last A1C in the office was:  Lab Results  Component Value Date   HGBA1C 5.6 03/11/2016   Patient is on Vitamin D supplement.  Lab Results  Component Value Date   VD25OH 83 03/11/2016     BMI is Body mass index is 22.9 kg/m., he is working on diet and exercise. Wt Readings from Last 3 Encounters:  06/22/16 164 lb 3.2 oz (74.5 kg)  03/11/16 162 lb 3.2 oz (73.6 kg)  03/04/16 164 lb (74.4 kg)    Current Medications:  Current Outpatient Prescriptions on File Prior to Visit  Medication Sig Dispense Refill  . ALPRAZolam (XANAX) 1 MG tablet TAKE ONE TABLET BY MOUTH THREE TIMES DAILY AS NEEDED FOR ANXIETY 270 tablet 0  . amantadine (SYMMETREL) 100 MG capsule Take 1 capsule (100 mg total) by mouth 2 (two) times daily. 180 capsule 3  . aspirin 81 MG tablet Take 81 mg by mouth daily.      . Cholecalciferol (VITAMIN D3) 2000 UNITS capsule Take 8,000 Units by mouth daily.     . clopidogrel (PLAVIX) 75 MG tablet TAKE ONE TABLET (75 MG TOTAL) BY MOUTH ONCE DAILY 90 tablet 1  . fludrocortisone (FLORINEF) 0.1 MG tablet Take 1 tablet (0.1 mg total) by mouth 2 (two) times daily. 60 tablet 5  . FREESTYLE LITE test strip TEST twice a day 150 each PRN  . Lancets (FREESTYLE) lancets 1 each by Other route as needed for other. Use as instructed    . loperamide (IMODIUM) 2 MG capsule Take 2 mg by mouth  as needed for diarrhea or loose stools.    . Melatonin 5 MG CAPS Take 5 mg by mouth at bedtime.    . nitroGLYCERIN (NITROSTAT) 0.4 MG SL tablet Sig: 1 tablet under tongue every 3 to 5 minutes as needed for Angina - Please Dispense # 2 bottles of # 25 tabs 50 tablet 99  . oxazepam (SERAX) 30 MG capsule Take 1 capsule (30 mg total) by mouth at bedtime as needed for sleep or anxiety. 30 capsule 0  . pantoprazole (PROTONIX) 40 MG tablet TAKE ONE TABLET BY MOUTH ONCE DAILY 90 tablet 3  . potassium citrate (UROCIT-K) 10 MEQ (1080 MG) SR tablet TAKE ONE TABLET BY MOUTH TWICE DAILY 180 tablet 1  . prochlorperazine (COMPAZINE) 5 MG tablet Take 1-2 tablets TID PRN for nausea. (Patient  taking differently: Take 5 mg by mouth every 8 (eight) hours as needed for nausea. ) 50 tablet 0  . QUEtiapine (SEROQUEL) 25 MG tablet TAKE ONE TABLET BY MOUTH THREE TIMES DAILY 90 tablet 0  . simvastatin (ZOCOR) 80 MG tablet TAKE ONE TABLET BY MOUTH AT BEDTIME 90 tablet 1  . VIGAMOX 0.5 % ophthalmic solution Place 1 drop into both eyes daily as needed (INFECTION(S)).   0   No current facility-administered medications on file prior to visit.     Medical History:  Past Medical History:  Diagnosis Date  . Anxiety   . Arthritis    "lower back; left ankle" (03/20/2014)  . ASTHMA   . Blood transfusion    "related to OHS, ulcers"  . CAD, ARTERY BYPASS GRAFT   . Colon polyps    adenomatous polyps  . COPD   . Diverticulosis   . GASTROESOPHAGEAL REFLUX DISEASE   . HYPERLIPIDEMIA   . Insomnia   . Leukodystrophy   . Nephrolithiasis    "I've had over 320 kidney stones" (03/20/2014)  . Sleep apnea    "don't always wear it" (03/20/2014)  . SUPRAVENTRICULAR TACHYCARDIA, HX OF   . TIA   . Ulcer     Allergies:  Allergies  Allergen Reactions  . Gabapentin Other (See Comments)    Severe confusion  . Prozac [Fluoxetine Hcl] Other (See Comments)    Patient state that it does not agree with him  . Soma [Carisoprodol] Other (See Comments)    Patient stated that it does not agree with him  . Beta Adrenergic Blockers Other (See Comments)    REACTION: Bradycardia     Review of Systems:  Review of Systems  Constitutional: Negative for chills, diaphoresis, fever, malaise/fatigue and weight loss.  HENT: Negative for congestion, ear pain and sore throat.   Eyes: Negative for blurred vision.  Respiratory: Negative for cough, shortness of breath and wheezing.   Cardiovascular: Negative for chest pain, palpitations and leg swelling.  Gastrointestinal: Negative for blood in stool, constipation, diarrhea, heartburn and melena.  Genitourinary: Negative.   Skin: Negative.   Neurological: Negative  for dizziness, loss of consciousness, weakness and headaches.  Psychiatric/Behavioral: Negative for depression. The patient is not nervous/anxious and does not have insomnia.     Family history- Review and unchanged  Social history- Review and unchanged  Physical Exam: BP 116/72   Pulse (!) 58   Temp 97.6 F (36.4 C)   Resp 14   Ht 5\' 11"  (1.803 m)   Wt 164 lb 3.2 oz (74.5 kg)   SpO2 96%   BMI 22.90 kg/m  Wt Readings from Last 3 Encounters:  06/22/16 164 lb  3.2 oz (74.5 kg)  03/11/16 162 lb 3.2 oz (73.6 kg)  03/04/16 164 lb (74.4 kg)    General Appearance: Well nourished well developed, in no apparent distress. Eyes: PERRLA, EOMs, conjunctiva no swelling or erythema ENT/Mouth: Ear canals normal without obstruction, swelling, erythma, discharge.  TMs normal bilaterally.  Oropharynx moist, clear, without exudate, or postoropharyngeal swelling. Neck: Supple, thyroid normal,no cervical adenopathy  Respiratory: Respiratory effort normal, Breath sounds clear A&P without rhonchi, wheeze, or rale.  No retractions, no accessory usage. Cardio: RRR with no MRGs. Brisk peripheral pulses without edema.  Abdomen: Soft, + BS,  Non tender, no guarding, rebound, hernias, masses. Musculoskeletal: Full ROM, 5/5 strength, Normal gait Skin: Warm, dry without rashes, lesions, ecchymosis.  Neuro: Awake and oriented X 3, Cranial nerves intact. Normal muscle tone, no cerebellar symptoms. Psych: Normal affect, Insight and Judgment appropriate.    Vicie Mutters, PA-C 2:48 PM [facility]

## 2016-07-07 ENCOUNTER — Encounter: Payer: Self-pay | Admitting: Gastroenterology

## 2016-08-03 ENCOUNTER — Other Ambulatory Visit: Payer: Self-pay | Admitting: Internal Medicine

## 2016-08-26 ENCOUNTER — Encounter: Payer: Self-pay | Admitting: Neurology

## 2016-08-26 ENCOUNTER — Ambulatory Visit (INDEPENDENT_AMBULATORY_CARE_PROVIDER_SITE_OTHER): Payer: PPO | Admitting: Neurology

## 2016-08-26 VITALS — BP 92/46 | HR 62 | Resp 14 | Ht 71.0 in | Wt 156.0 lb

## 2016-08-26 DIAGNOSIS — G479 Sleep disorder, unspecified: Secondary | ICD-10-CM | POA: Diagnosis not present

## 2016-08-26 DIAGNOSIS — G214 Vascular parkinsonism: Secondary | ICD-10-CM | POA: Diagnosis not present

## 2016-08-26 DIAGNOSIS — R413 Other amnesia: Secondary | ICD-10-CM | POA: Diagnosis not present

## 2016-08-26 NOTE — Patient Instructions (Addendum)
You have been on amantadine 100 mg once daily, per family doctor. Please try to go without it: let's stop the amantadine at this time. You may have less mental fogginess without it. It you have significant tremor increase, you may go back to one pill a day of the amantadine.   Please get your eyes checked.   Please drink plenty of water, about 6 glasses.

## 2016-08-26 NOTE — Progress Notes (Signed)
Subjective:    Patient ID: John [name] is a 81 y.o. male.  HPI     Interim history:   John [name] is a 81 year old right-handed gentleman with an underlying medical history of hypertension, hyperlipidemia, remote history of smoking, current history of chewing tobacco, type 2 diabetes, vitamin D deficiency, coronary artery disease, status post CABG (two-vessel, 1996), reflux disease, depression, chronic kidney disease, history of TIA, asthma, COPD, history of kidney stones, history of SVT, arthritis and obstructive sleep apnea, who presents for follow-up consultation of his parkinsonism. He is unaccompanied today. I last saw him on 03/04/2016, at which time his exam was fairly stable. I suggested we monitor his memory and continuous amantadine at the current dose. Today, 03/04/2016: He had some memory issues. No driving issues, no disorientation, no hallucinations, but some word finding and enunciation problems, still trouble with sleep, going to sleep and staying asleep. He was on Seroquel 25 mg qHS and 1 mg of Xanax. No recent falls, has had some interim stressor with wife's medical issues.   Today, 08/26/2016: He reports still having issues with sleep. Goes to sleep fairly good, continues to take Xanax 1 mg and Seroquel 25 mg, no more Serax. Short term memory not as good. Also takes Melatonin at night. No recent falls, tries to be careful. Has a treadmill, uses it sometimes. Has been on amantadine 100 mg once daily per PCP.   Previously:   I saw him on 11/17/2015, at which time he reported no new symptoms and no new complaints. He was taking amantadine 100 mg twice daily, he was trying to hydrate well. He had no recent falls. He felt his memory was not as good since his hospital stay in November 2016 but had no other specific new memory related complaints. He lives with his wife. His exam was fairly stable. We mutually agreed to maintain him on amantadine 100 mg twice daily, exam was not in  keeping with idiopathic Parkinson's disease.   I first met him on 09/22/2015 after a fall and a recent hospitalization for this. I kept him on amantadine 100 mg twice daily at the time.   09/22/2015: He was recently admitted to the hospital on 09/10/2015 secondary to encephalopathy. He was discharged on 09/11/2015 and I reviewed the hospital records including the discharge summary. He was admitted from the emergency room after a fall at home. His wife was unable to help him after his fall. He rolled out of bed. He had recurrent falls at home prior to that. CT head without contrast in the emergency room on 09/10/2015 showed: Stable examination with stable periventricular white matter disease and hypodensities in the basal ganglia. No acute posttraumatic findings.   Postadmission he had a brain MRI without contrast on 09/10/2015: Atrophy and chronic microvascular ischemia.  No acute abnormality. In addition, I personally reviewed the images through the PACS system. I agree with the findings.   During his hospitalization, neurology was consulted and suggested he start amantadine 100 mg twice daily for parkinsonism.    He was discharged to home with home health physical therapy which he is currently enrolled in.     I reviewed your office note from 09/04/2015.   He has had a tremor in both hands for a few months, some memory loss. His daughter, John [name], reports that he has had some instances of missing a turn recently. He is currently not driving but would like to go back to driving. He has had a history of  orthostatic hypotension with improvement after he was started on Florinef several months ago. He has no family history of Parkinson's disease. He feels that the amantadine has helped. He denies any side effects. He has a history of problems sleeping at night and has been on Xanax at night for quite some time. About a year ago he was started on Seroquel at night. He has no history of dream enactments.    His Past Medical History Is Significant For: Past Medical History:  Diagnosis Date  . Anxiety   . Arthritis    "lower back; left ankle" (03/20/2014)  . ASTHMA   . Blood transfusion    "related to OHS, ulcers"  . CAD, ARTERY BYPASS GRAFT   . Colon polyps    adenomatous polyps  . COPD   . Diverticulosis   . GASTROESOPHAGEAL REFLUX DISEASE   . HYPERLIPIDEMIA   . Insomnia   . Leukodystrophy   . Nephrolithiasis    "I've had over 320 kidney stones" (03/20/2014)  . Sleep apnea    "don't always wear it" (03/20/2014)  . SUPRAVENTRICULAR TACHYCARDIA, HX OF   . TIA   . Ulcer ([place])     His Past Surgical History Is Significant For: Past Surgical History:  Procedure Laterality Date  . ANKLE FRACTURE SURGERY Left ~ 2012  . CARDIAC CATHETERIZATION    . COLONOSCOPY    . CORONARY ARTERY BYPASS GRAFT  1996   CABG X2  . CYSTOSCOPY WITH URETEROSCOPY, STONE BASKETRY AND STENT PLACEMENT    . Cystourethroscopy with stent removal.    . EXCISIONAL HEMORRHOIDECTOMY    . GASTRECTOMY    . HIATAL HERNIA REPAIR    . IRRIGATION AND DEBRIDEMENT SEBACEOUS CYST     "off my back  . POLYPECTOMY    . PROSTATE SURGERY     "took the center of my prostate out"  . UPPER GASTROINTESTINAL ENDOSCOPY    . VAGOTOMY      His Family History Is Significant For: Family History  Problem Relation Age of Onset  . Alcohol abuse Father   . Stroke Mother   . Heart disease Brother   . Diabetes Brother   . Heart disease Paternal Grandmother   . Colon cancer Neg Hx   . Stomach cancer Neg Hx     His Social History Is Significant For: Social History   Social History  . Marital status: Married    Spouse name: N/A  . Number of children: 2  . Years of education: 14   Occupational History  . 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 Smoking status: Former Smoker    Packs/day: 0.50    Types: Cigarettes    Quit date: 10/25/1962  . Smokeless tobacco: Current User    Types:  Chew  . Alcohol use No  . Drug use: No  . Sexual activity: Yes   Other Topics Concern  . None   Social History Narrative   Denies caffeine use     His Allergies Are:  Allergies  Allergen Reactions  . Gabapentin Other (See Comments)    Severe confusion  . Prozac [Fluoxetine Hcl] Other (See Comments)    Patient state that it does not agree with him  . Soma [Carisoprodol] Other (See Comments)    Patient stated that it does not agree with him  . Beta Adrenergic Blockers Other (See Comments)    REACTION: Bradycardia  :   His Current Medications Are:  Outpatient Encounter  Prescriptions as of 08/26/2016  Medication Sig  . ALPRAZolam (XANAX) 1 MG tablet TAKE ONE TABLET BY MOUTH THREE TIMES DAILY AS NEEDED FOR ANXIETY  . amantadine (SYMMETREL) 100 MG capsule Take 1 capsule (100 mg total) by mouth 2 (two) times daily.  [name] aspirin 81 MG tablet Take 81 mg by mouth daily.    . Cholecalciferol (VITAMIN D3) 2000 UNITS capsule Take 8,000 Units by mouth daily.   . clopidogrel (PLAVIX) 75 MG tablet TAKE ONE TABLET (75 MG TOTAL) BY MOUTH ONCE DAILY  . fludrocortisone (FLORINEF) 0.1 MG tablet Take 1 tablet (0.1 mg total) by mouth 2 (two) times daily.  [name] FREESTYLE LITE test strip TEST twice a day  . Lancets (FREESTYLE) lancets 1 each by Other route as needed for other. Use as instructed  . loperamide (IMODIUM) 2 MG capsule Take 2 mg by mouth as needed for diarrhea or loose stools.  . Melatonin 5 MG CAPS Take 5 mg by mouth at bedtime.  . nitroGLYCERIN (NITROSTAT) 0.4 MG SL tablet Sig: 1 tablet under tongue every 3 to 5 minutes as needed for Angina - Please Dispense # 2 bottles of # 25 tabs  . oxazepam (SERAX) 30 MG capsule Take 1 capsule (30 mg total) by mouth at bedtime as needed for sleep or anxiety.  . pantoprazole (PROTONIX) 40 MG tablet TAKE ONE TABLET BY MOUTH ONCE DAILY  . potassium citrate (UROCIT-K) 10 MEQ (1080 MG) SR tablet TAKE ONE TABLET BY MOUTH TWICE DAILY  . prochlorperazine  (COMPAZINE) 5 MG tablet Take 1-2 tablets TID PRN for nausea. (Patient taking differently: Take 5 mg by mouth every 8 (eight) hours as needed for nausea. )  . QUEtiapine (SEROQUEL) 25 MG tablet TAKE ONE TABLET BY MOUTH THREE TIMES DAILY  . simvastatin (ZOCOR) 80 MG tablet TAKE ONE TABLET BY MOUTH AT BEDTIME  . VIGAMOX 0.5 % ophthalmic solution Place 1 drop into both eyes daily as needed (INFECTION(S)).    No facility-administered encounter medications on file as of 08/26/2016.   :  Review of Systems:  Out of a complete 14 point review of systems, all are reviewed and negative with the exception of these symptoms as listed below: Review of Systems  Neurological:       Patient has trouble staying asleep at night, he will address with PCP.  No new changes.     Objective:  Neurologic Exam  Physical Exam Physical Examination:   Vitals:   08/26/16 1543  BP: (!) 92/46  Pulse: 62  Resp: 14   General Examination: The patient is a very pleasant 81 y.o. male in no acute distress. He is in good spirits today. Reports no lightheadedness.  HEENT: Normocephalic, atraumatic, pupils are equal, round and reactive to light and accommodation. He is status post bilateral cataract repairs. Extraocular tracking shows mild saccadic breakdown without nystagmus noted. There is limitation to upper gaze. There is no significant decrease in eye blink rate. Hearing is intact. Face is symmetric with no significant facial masking and normal facial sensation. There is no lip, neck or jaw tremor. Neck is mildly rigid with intact passive ROM. There are no carotid bruits on auscultation. Oropharynx exam reveals moderate mouth dryness and moderate airway crowding noted. Mallampati is class II. Tongue protrudes centrally and palate elevates symmetrically.   Chest: is clear to auscultation without wheezing, rhonchi or crackles noted.  Heart: sounds are regular and normal without murmurs, rubs or gallops noted.   Abdomen:  is soft, non-tender and non-distended  with normal bowel sounds appreciated on auscultation.  Extremities: There is no pitting edema in the distal lower extremities bilaterally. Pedal pulses are intact.   Skin: is warm and dry with no trophic changes noted.    Musculoskeletal: exam reveals no obvious joint deformities, tenderness, joint swelling or erythema.  Neurologically:  Mental status: The patient is awake and alert, paying good  attention. He is able to give a fairly good history. His memory, attention, language and knowledge are very mildly impaired. There is no aphasia, agnosia, apraxia or anomia. There is a mild degree of bradyphrenia. Speech is mildly hypophonic with no dysarthria noted. Mood is congruent and affect is normal.   On 03/04/2016: MMSE 28/30, CDT: 4/4, AFT: 15/min.  On 08/26/2016: MMSE: 28/30, CDT: 3/4, AFT: 14/min.   Cranial nerves are as described above under HEENT exam. In addition, shoulder shrug is normal with equal shoulder height noted.  Motor exam: Normal bulk, and strength for age is noted. There are no dyskinesias noted.  Tone is very mildly rigid. There is no significant evidence of cogwheeling, no significant resting tremor, no significant postural or action tremor. Fine motor skills are mildly impaired throughout, no significant lateralization is noted today. He stands up slowly with mild difficulty and pushes himself up, posture is age-appropriate. He walks slowly and cautiously, no shuffling, fairly well preserved arm swing, turns slowly, tandem walk is not possible. Sensory exam is intact in the upper and lower extremities bilaterally. Reflexes are about 1+ throughout, absent in the ankles.   Assessment and Plan:   In summary, John [name] is a very pleasant 81 year old male with an underlying medical history of hypertension, hyperlipidemia, very remote history of smoking, current history of chewing tobacco, type 2 diabetes, vitamin D deficiency,  coronary artery disease, status post CABG (two-vessel, 1996), reflux disease, depression, chronic kidney disease, history of TIA, asthma, COPD, history of kidney stones, history of SVT, arthritis and obstructive sleep apnea, who presents for follow up consultation of his mild parkinsonism. He was hospitalized in November 2016 and was noted to have some signs of parkinsonism. He was placed on amantadine 100 mg twice daily and felt improved and we maintained the medication when I first met him on 09/22/2015. He has no overt signs of idiopathic Parkinson's disease and no significant progression of his parkinsonism. Signs of very mild, in fact, he has no significant tremor, no lateralization today and from what I understand his primary care physician has already reduced his amantadine to once daily. 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 He is at risk for vascular parkinsonism and vascular dementia. His memory scores are stable and we will continue to monitor. He has ongoing issues with his sleep but is on a combination of Xanax and Seroquel. He also takes melatonin.  Today, I suggested that we take him completely off of amantadine, maybe will help by having less mental fogginess perhaps. There are some side effects associated with amantadine even though he is on low dose. To that and, I asked him to stop it and monitor his symptoms, should he have significant tremors or changes in his mobility we can always restart it at 1 pill once daily. He recently filled a prescription so would not need another prescription anytime soon. I suggested a 6 month follow-up, sooner as needed, I answered all his questions today and he was in  agreement.  I spent 25 minutes in total face-to-face time with the patient, more than 50% of which was spent in counseling and coordination of care, reviewing test  results, reviewing medication and discussing or reviewing the diagnosis of parkinsonism, its prognosis and treatment options.

## 2016-08-31 ENCOUNTER — Other Ambulatory Visit: Payer: Self-pay | Admitting: Internal Medicine

## 2016-09-06 ENCOUNTER — Ambulatory Visit: Payer: PPO | Admitting: Neurology

## 2016-09-24 ENCOUNTER — Other Ambulatory Visit: Payer: Self-pay | Admitting: Internal Medicine

## 2016-10-11 DIAGNOSIS — R310 Gross hematuria: Secondary | ICD-10-CM | POA: Diagnosis not present

## 2016-10-11 DIAGNOSIS — N3001 Acute cystitis with hematuria: Secondary | ICD-10-CM | POA: Diagnosis not present

## 2016-10-11 DIAGNOSIS — R1084 Generalized abdominal pain: Secondary | ICD-10-CM | POA: Diagnosis not present

## 2016-10-11 DIAGNOSIS — N201 Calculus of ureter: Secondary | ICD-10-CM | POA: Diagnosis not present

## 2016-10-11 DIAGNOSIS — R109 Unspecified abdominal pain: Secondary | ICD-10-CM | POA: Diagnosis not present

## 2016-10-11 DIAGNOSIS — N200 Calculus of kidney: Secondary | ICD-10-CM | POA: Diagnosis not present

## 2016-10-12 ENCOUNTER — Other Ambulatory Visit: Payer: Self-pay | Admitting: Internal Medicine

## 2016-10-12 ENCOUNTER — Ambulatory Visit (INDEPENDENT_AMBULATORY_CARE_PROVIDER_SITE_OTHER): Payer: PPO | Admitting: Internal Medicine

## 2016-10-12 VITALS — BP 96/48 | HR 80 | Temp 97.7°F | Resp 16 | Ht 71.0 in | Wt 158.0 lb

## 2016-10-12 DIAGNOSIS — N182 Chronic kidney disease, stage 2 (mild): Secondary | ICD-10-CM | POA: Diagnosis not present

## 2016-10-12 DIAGNOSIS — N390 Urinary tract infection, site not specified: Secondary | ICD-10-CM | POA: Diagnosis not present

## 2016-10-12 DIAGNOSIS — Z0001 Encounter for general adult medical examination with abnormal findings: Secondary | ICD-10-CM

## 2016-10-12 DIAGNOSIS — K219 Gastro-esophageal reflux disease without esophagitis: Secondary | ICD-10-CM | POA: Diagnosis not present

## 2016-10-12 DIAGNOSIS — I25812 Atherosclerosis of bypass graft of coronary artery of transplanted heart without angina pectoris: Secondary | ICD-10-CM

## 2016-10-12 DIAGNOSIS — E1122 Type 2 diabetes mellitus with diabetic chronic kidney disease: Secondary | ICD-10-CM

## 2016-10-12 DIAGNOSIS — I951 Orthostatic hypotension: Secondary | ICD-10-CM | POA: Diagnosis not present

## 2016-10-12 DIAGNOSIS — E782 Mixed hyperlipidemia: Secondary | ICD-10-CM

## 2016-10-12 DIAGNOSIS — E559 Vitamin D deficiency, unspecified: Secondary | ICD-10-CM | POA: Diagnosis not present

## 2016-10-12 DIAGNOSIS — R972 Elevated prostate specific antigen [PSA]: Secondary | ICD-10-CM | POA: Diagnosis not present

## 2016-10-12 DIAGNOSIS — Z125 Encounter for screening for malignant neoplasm of prostate: Secondary | ICD-10-CM | POA: Diagnosis not present

## 2016-10-12 DIAGNOSIS — R6889 Other general symptoms and signs: Secondary | ICD-10-CM | POA: Diagnosis not present

## 2016-10-12 DIAGNOSIS — Z136 Encounter for screening for cardiovascular disorders: Secondary | ICD-10-CM

## 2016-10-12 DIAGNOSIS — I10 Essential (primary) hypertension: Secondary | ICD-10-CM

## 2016-10-12 DIAGNOSIS — Z79899 Other long term (current) drug therapy: Secondary | ICD-10-CM | POA: Diagnosis not present

## 2016-10-12 DIAGNOSIS — Z1212 Encounter for screening for malignant neoplasm of rectum: Secondary | ICD-10-CM

## 2016-10-12 LAB — CBC WITH DIFFERENTIAL/PLATELET
BASOS PCT: 0 %
Basophils Absolute: 0 cells/uL (ref 0–200)
EOS PCT: 1 %
Eosinophils Absolute: 86 cells/uL (ref 15–500)
HCT: 38.3 % — ABNORMAL LOW (ref 38.5–50.0)
Hemoglobin: 12.8 g/dL — ABNORMAL LOW (ref 13.2–17.1)
LYMPHS ABS: 1634 {cells}/uL (ref 850–3900)
Lymphocytes Relative: 19 %
MCH: 32.1 pg (ref 27.0–33.0)
MCHC: 33.4 g/dL (ref 32.0–36.0)
MCV: 96 fL (ref 80.0–100.0)
MPV: 9.6 fL (ref 7.5–12.5)
Monocytes Absolute: 774 cells/uL (ref 200–950)
Monocytes Relative: 9 %
NEUTROS ABS: 6106 {cells}/uL (ref 1500–7800)
NEUTROS PCT: 71 %
Platelets: 173 10*3/uL (ref 140–400)
RBC: 3.99 MIL/uL — AB (ref 4.20–5.80)
RDW: 13.8 % (ref 11.0–15.0)
WBC: 8.6 10*3/uL (ref 3.8–10.8)

## 2016-10-12 LAB — PSA: PSA: 9.2 ng/mL — ABNORMAL HIGH (ref <=4.0)

## 2016-10-12 LAB — HEMOGLOBIN A1C
Hgb A1c MFr Bld: 5 % (ref <5.7)
Mean Plasma Glucose: 97 mg/dL

## 2016-10-12 LAB — TSH: TSH: 3.33 mIU/L (ref 0.40–4.50)

## 2016-10-12 NOTE — Patient Instructions (Signed)

## 2016-10-12 NOTE — Progress Notes (Signed)
[facility]   Unk Pinto, M.D.    Uvaldo Bristle. Silverio Lay, P.A.-C      Starlyn Skeans, P.A.-C  [facility]                [address] [place], [place] [id number] Telephone [phone] Telefax [phone] Annual  Screening/Preventative Visit  & Comprehensive Evaluation & Examination     This very nice 81 y.o. MWM presents for a Screening/Preventative Visit & comprehensive evaluation and management of multiple medical co-morbidities.  Patient has been followed for HTN, T2_NIDDM, Hyperlipidemia and Vitamin D Deficiency. Further patient has hx/o mild dementia with poor ST recall, but for the most part continues to remain functional. He is also followed by Dr Rexene Alberts of Neurology for suspect possible early or atypical Parkinson's Dz.      Patient has hx/o recurrent kidney stones (>200+)  also relates a 2 day hx/o bilat groin pains and was seen by Urology yesterday and given an Rx Flomax suspecting a kidney stone.      HTN predates since the 1960's.   In 1996, he had a 2-V CABG and has done well since then. Patient also has dysautonomia with labile postural hypotension  and has been weaned off of BP meds and transitioned to Florinef which seems to be controlling his postural hypotension. Today's BP is low at 96/48. Patient denies any cardiac symptoms as chest pain, palpitations, shortness of breath, dizziness or ankle swelling.     Patient's hyperlipidemia is controlled with diet and medications. Patient denies myalgias or other medication SE's. Last lipids were at goal: Lab Results  Component Value Date   CHOL 138 10/12/2016   HDL 65 10/12/2016   LDLCALC 59 10/12/2016   TRIG 68 10/12/2016   CHOLHDL 2.1 10/12/2016      Patient has T2_NIDDM since 1995 and with significant weight loss [name] normalized and he has remained off medications. Patient has CKD3 (GFR 52).  Patient denies reactive hypoglycemic  symptoms, visual blurring, diabetic polys or paresthesias. Last A1c was at goal:  Lab Results  Component Value Date   HGBA1C 5.0 10/12/2016       Finally, patient has history of Vitamin D Deficiency in 2008 of "18"  and last vitamin D was at goal:  Lab Results  Component Value Date   VD25OH 72 10/12/2016   Current Outpatient Prescriptions on File Prior to Visit  Medication Sig  . ALPRAZolam (XANAX) 1 MG tablet TAKE ONE TABLET BY MOUTH THREE TIMES DAILY AS NEEDED FOR ANXIETY  . aspirin 81 MG tablet Take 81 mg by mouth daily.    . Cholecalciferol (VITAMIN D3) 2000 UNITS capsule Take 8,000 Units by mouth daily.   . clopidogrel (PLAVIX) 75 MG tablet TAKE ONE TABLET (75 MG TOTAL) BY MOUTH ONCE DAILY  . FREESTYLE LITE test strip TEST twice a day  . Lancets (FREESTYLE) lancets 1 each by Other route as needed for other. Use as instructed  . loperamide (IMODIUM) 2 MG capsule Take 2 mg by mouth as needed for diarrhea or loose stools.  . Melatonin 5 MG CAPS Take 5 mg by mouth at bedtime.  . nitroGLYCERIN (NITROSTAT) 0.4 MG SL tablet Sig: 1 tablet under tongue every 3 to 5 minutes as needed for Angina - Please Dispense # 2 bottles of # 25 tabs  . pantoprazole (PROTONIX) 40 MG tablet TAKE ONE TABLET  BY MOUTH ONCE DAILY  . prochlorperazine (COMPAZINE) 5 MG tablet TAKE 1-2 TABS THREE TIMES A DAY AS NEEDED FOR NAUSEA  . QUEtiapine (SEROQUEL) 25 MG tablet TAKE ONE TABLET BY MOUTH THREE TIMES DAILY  . simvastatin (ZOCOR) 80 MG tablet TAKE ONE TABLET BY MOUTH AT BEDTIME  . fludrocortisone (FLORINEF) 0.1 MG tablet Take 1 tablet (0.1 mg total) by mouth 2 (two) times daily. (Patient not taking: Reported on 10/12/2016)  . oxazepam (SERAX) 30 MG capsule Take 1 capsule (30 mg total) by mouth at bedtime as needed for sleep or anxiety. (Patient not taking: Reported on 10/12/2016)  . potassium citrate (UROCIT-K) 10 MEQ (1080 MG) SR tablet TAKE ONE TABLET BY MOUTH TWICE DAILY (Patient not taking: Reported on  10/12/2016)   No current facility-administered medications on file prior to visit.    Allergies  Allergen Reactions  . Gabapentin Other (See Comments)    Severe confusion  . Prozac [Fluoxetine Hcl] Other (See Comments)    Patient state that it does not agree with him  . Soma [Carisoprodol] Other (See Comments)    Patient stated that it does not agree with him  . Beta Adrenergic Blockers Other (See Comments)    REACTION: Bradycardia   Past Medical History:  Diagnosis Date  . Anxiety   . Arthritis    "lower back; left ankle" (03/20/2014)  . ASTHMA   . Blood transfusion    "related to OHS, ulcers"  . CAD, ARTERY BYPASS GRAFT   . Colon polyps    adenomatous polyps  . COPD   . Diverticulosis   . GASTROESOPHAGEAL REFLUX DISEASE   . HYPERLIPIDEMIA   . Insomnia   . Leukodystrophy   . Nephrolithiasis    "I've had over 320 kidney stones" (03/20/2014)  . Sleep apnea    "don't always wear it" (03/20/2014)  . SUPRAVENTRICULAR TACHYCARDIA, HX OF   . TIA   . Ulcer [facility])    Health Maintenance  Topic Date Due  . OPHTHALMOLOGY EXAM  08/16/2015  . HEMOGLOBIN A1C  04/12/2017  . FOOT EXAM  10/12/2017  . URINE MICROALBUMIN  10/12/2017  . COLONOSCOPY  09/06/2018  . TETANUS/TDAP  08/02/2023  . INFLUENZA VACCINE  Completed  . ZOSTAVAX  Completed  . PNA vac Low Risk Adult  Completed   Immunization History  Administered Date(s) Administered  . Influenza, High Dose Seasonal PF 08/13/2013, 08/05/2014, 07/23/2015, 06/22/2016  . Pneumococcal Conjugate-13 08/27/2014  . Pneumococcal Polysaccharide-23 08/06/2012  . Tdap 08/01/2013  . Zoster 05/02/2006   Past Surgical History:  Procedure Laterality Date  . ANKLE FRACTURE SURGERY Left ~ 2012  . CARDIAC CATHETERIZATION    . COLONOSCOPY    . CORONARY ARTERY BYPASS GRAFT  1996   CABG X2  . CYSTOSCOPY WITH URETEROSCOPY, STONE BASKETRY AND STENT PLACEMENT    . Cystourethroscopy with stent removal.    . EXCISIONAL HEMORRHOIDECTOMY    .  GASTRECTOMY    . HIATAL HERNIA REPAIR    . IRRIGATION AND DEBRIDEMENT SEBACEOUS CYST     "off my back  . POLYPECTOMY    . PROSTATE SURGERY     "took the center of my prostate out"  . UPPER GASTROINTESTINAL ENDOSCOPY    . VAGOTOMY     Family History  Problem Relation Age of Onset  . Alcohol abuse Father   . Stroke Mother   . Heart disease Brother   . Diabetes Brother   . Heart disease Paternal Grandmother   . Colon cancer Neg  Hx   . Stomach cancer Neg Hx    Social History   Social History  . Marital status: Married    Spouse name: N/A  . Number of children: 2  . Years of education: 14   Occupational History  . 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 Smoking status: Former Smoker    Packs/day: 0.50    Types: Cigarettes    Quit date: 10/25/1962  . Smokeless tobacco: Current User    Types: Chew  . Alcohol use No  . Drug use: No  . Sexual activity: Yes   Other Topics Concern  . Not on file   Social History Narrative   Denies caffeine use     ROS Constitutional: Denies fever, chills, weight loss/gain, headaches, insomnia,  night sweats or change in appetite. Does c/o fatigue. Eyes: Denies redness, blurred vision, diplopia, discharge, itchy or watery eyes.  ENT: Denies discharge, congestion, post nasal drip, epistaxis, sore throat, earache, hearing loss, dental pain, Tinnitus, Vertigo, Sinus pain or snoring.  Cardio: Denies chest pain, palpitations, irregular heartbeat, syncope, dyspnea, diaphoresis, orthopnea, PND, claudication or edema Respiratory: denies cough, dyspnea, DOE, pleurisy, hoarseness, laryngitis or wheezing.  Gastrointestinal: Denies dysphagia, heartburn, reflux, water brash, pain, cramps, nausea, vomiting, bloating, diarrhea, constipation, hematemesis, melena, hematochezia, jaundice or hemorrhoids Genitourinary: Denies dysuria, frequency, urgency, nocturia, hesitancy, discharge, hematuria or flank  pain Musculoskeletal: Denies arthralgia, myalgia, stiffness, Jt. Swelling, pain, limp or strain/sprain. Denies Falls. Skin: Denies puritis, rash, hives, warts, acne, eczema or change in skin lesion Neuro: No weakness, tremor, incoordination, spasms, paresthesia or pain Psychiatric: Denies confusion, memory loss or sensory loss. Denies Depression. Endocrine: Denies change in weight, skin, hair change, nocturia, and paresthesia, diabetic polys, visual blurring or hyper / hypo glycemic episodes.  Heme/Lymph: No excessive bleeding, bruising or enlarged lymph nodes.  Physical Exam  BP (!) 96/48   Pulse 80   Temp 97.7 F (36.5 C)   Resp 16   Ht 5\' 11"  (1.803 m)   Wt 158 lb (71.7 kg)   BMI 22.04 kg/m   General Appearance: Well nourished, in no apparent distress.  Eyes: PERRLA, EOMs, conjunctiva no swelling or erythema, normal fundi and vessels. Sinuses: No frontal/maxillary tenderness ENT/Mouth: EACs patent / TMs  nl. Nares clear without erythema, swelling, mucoid exudates. Oral hygiene is good. No erythema, swelling, or exudate. Tongue normal, non-obstructing. Tonsils not swollen or erythematous. Hearing normal.  Neck: Supple, thyroid normal. No bruits, nodes or JVD. Respiratory: Respiratory effort normal.  BS equal and clear bilateral without rales, rhonci, wheezing or stridor. Cardio: Heart sounds are normal with regular rate and rhythm and no murmurs, rubs or gallops. Peripheral pulses are normal and equal bilaterally without edema. No aortic or femoral bruits. Chest: symmetric with normal excursions and percussion.  Abdomen: Soft, with Nl bowel sounds. Nontender, no guarding, rebound, hernias, masses, or organomegaly.  Lymphatics: Non tender without lymphadenopathy.  Genitourinary: No hernias.Testes nl. DRE - deferred to Urology.  Musculoskeletal: Full ROM all peripheral extremities, joint stability, 5/5 strength, and normal gait. Skin: Warm and dry without rashes, lesions, cyanosis,  clubbing or  ecchymosis.  Neuro: Cranial nerves intact, reflexes equal bilaterally. Normal muscle tone, no cerebellar symptoms. Sensation intact to touch, Vibratory and Monofilament to the toes bilaterally. [name]  Pysch: Alert and oriented X 3 with normal affect, insight and judgment appropriate.   Assessment and Plan  1. Annual Preventative/Screening Exam    2. Essential hypertension  - Microalbumin / creatinine  urine ratio - EKG 12-Lead - [place], RETROPERITNL ABD,  LTD - Urinalysis, Routine w reflex microscopic - CBC with Differential/Platelet - BASIC METABOLIC PANEL WITH GFR - TSH  3. Mixed hyperlipidemia  - EKG 12-Lead - [place], RETROPERITNL ABD,  LTD - Hepatic function panel - Lipid panel - TSH  4. T_2NIDDM w/Stage 3 CKD  (HCC)  - Microalbumin / creatinine urine ratio - EKG 12-Lead - [place], RETROPERITNL ABD,  LTD - HM DIABETES FOOT EXAM - LOW EXTREMITY NEUR EXAM DOCUM - Hemoglobin A1c - Insulin, random  5. Vitamin D deficiency  - VITAMIN D 25 Hydroxy (Vit-D Deficiency, Fractures)  6. Autonomic postural hypotension   7. Gastroesophageal reflux disease, esophagitis presence not specified   8. Screening for rectal cancer  - POC Hemoccult Bld/Stl (3-Cd Home Screen); Future  9. Prostate cancer screening  - PSA  10. Screening for ischemic heart disease  - EKG 12-Lead  11. Screening for AAA (aortic abdominal aneurysm)  - [place], RETROPERITNL ABD,  LTD  12. Medication management  - Urinalysis, Routine w reflex microscopic - CBC with Differential/Platelet - BASIC METABOLIC PANEL WITH GFR - Hepatic function panel - Magnesium  13.  ASCAD, s/p CABG       Continue prudent diet as discussed, weight control, BP monitoring, regular exercise, and medications as discussed.  Discussed med effects and SE's. Routine screening labs and tests as requested with regular follow-up as recommended. Over 40 minutes of exam, counseling, chart review and high complex critical decision  making was performed

## 2016-10-13 LAB — BASIC METABOLIC PANEL WITH GFR
BUN: 23 mg/dL (ref 7–25)
CALCIUM: 9.2 mg/dL (ref 8.6–10.3)
CO2: 20 mmol/L (ref 20–31)
Chloride: 107 mmol/L (ref 98–110)
Creat: 1.3 mg/dL — ABNORMAL HIGH (ref 0.70–1.18)
GFR, EST AFRICAN AMERICAN: 60 mL/min (ref >=60)
GFR, EST NON AFRICAN AMERICAN: 52 mL/min — AB (ref >=60)
Glucose, Bld: 116 mg/dL — ABNORMAL HIGH (ref 65–99)
POTASSIUM: 3.8 mmol/L (ref 3.5–5.3)
SODIUM: 143 mmol/L (ref 135–146)

## 2016-10-13 LAB — URINALYSIS, ROUTINE W REFLEX MICROSCOPIC
BILIRUBIN URINE: NEGATIVE
Hgb urine dipstick: NEGATIVE
Ketones, ur: NEGATIVE
Nitrite: NEGATIVE
PH: 5 (ref 5.0–8.0)
Specific Gravity, Urine: 1.03 (ref 1.001–1.035)

## 2016-10-13 LAB — URINALYSIS, MICROSCOPIC ONLY
BACTERIA UA: NONE SEEN [HPF]
CASTS: NONE SEEN [LPF]
Crystals: NONE SEEN [HPF]
RBC / HPF: NONE SEEN RBC/HPF (ref <=2)
SQUAMOUS EPITHELIAL / LPF: NONE SEEN [HPF] (ref <=5)
WBC, UA: >60 WBC/HPF — AB (ref <=5)
YEAST: NONE SEEN [HPF]

## 2016-10-13 LAB — LIPID PANEL
CHOL/HDL RATIO: 2.1 ratio (ref <5.0)
Cholesterol: 138 mg/dL (ref <200)
HDL: 65 mg/dL (ref >40)
LDL Cholesterol: 59 mg/dL (ref <100)
TRIGLYCERIDES: 68 mg/dL (ref <150)
VLDL: 14 mg/dL (ref <30)

## 2016-10-13 LAB — HEPATIC FUNCTION PANEL
ALT: 13 U/L (ref 9–46)
AST: 16 U/L (ref 10–35)
Albumin: 3.5 g/dL — ABNORMAL LOW (ref 3.6–5.1)
Alkaline Phosphatase: 47 U/L (ref 40–115)
BILIRUBIN DIRECT: 0.1 mg/dL (ref <=0.2)
BILIRUBIN INDIRECT: 0.4 mg/dL (ref 0.2–1.2)
Total Bilirubin: 0.5 mg/dL (ref 0.2–1.2)
Total Protein: 6 g/dL — ABNORMAL LOW (ref 6.1–8.1)

## 2016-10-13 LAB — MICROALBUMIN / CREATININE URINE RATIO
Creatinine, Urine: 259 mg/dL (ref 20–370)
MICROALB/CREAT RATIO: 14 ug/mg{creat} (ref <30)
Microalb, Ur: 3.6 mg/dL

## 2016-10-13 LAB — VITAMIN D 25 HYDROXY (VIT D DEFICIENCY, FRACTURES): Vit D, 25-Hydroxy: 72 ng/mL (ref 30–100)

## 2016-10-13 LAB — INSULIN, RANDOM: Insulin: 15.3 u[IU]/mL (ref 2.0–19.6)

## 2016-10-13 LAB — MAGNESIUM: Magnesium: 1.4 mg/dL — ABNORMAL LOW (ref 1.5–2.5)

## 2016-10-14 ENCOUNTER — Other Ambulatory Visit: Payer: Self-pay | Admitting: Internal Medicine

## 2016-10-14 DIAGNOSIS — R972 Elevated prostate specific antigen [PSA]: Secondary | ICD-10-CM

## 2016-10-14 LAB — URINE CULTURE: Organism ID, Bacteria: NO GROWTH

## 2016-10-14 LAB — PSA, TOTAL AND FREE
PSA, Free: 2.3 ng/mL
PSA, Total: 10.9 ng/mL — ABNORMAL HIGH (ref <=4.0)

## 2016-10-15 ENCOUNTER — Encounter: Payer: Self-pay | Admitting: Internal Medicine

## 2016-10-26 ENCOUNTER — Other Ambulatory Visit: Payer: Self-pay | Admitting: Internal Medicine

## 2016-10-28 DIAGNOSIS — D1801 Hemangioma of skin and subcutaneous tissue: Secondary | ICD-10-CM | POA: Diagnosis not present

## 2016-10-28 DIAGNOSIS — L814 Other melanin hyperpigmentation: Secondary | ICD-10-CM | POA: Diagnosis not present

## 2016-10-28 DIAGNOSIS — Z85828 Personal history of other malignant neoplasm of skin: Secondary | ICD-10-CM | POA: Diagnosis not present

## 2016-10-28 DIAGNOSIS — C44301 Unspecified malignant neoplasm of skin of nose: Secondary | ICD-10-CM | POA: Diagnosis not present

## 2016-10-28 DIAGNOSIS — D485 Neoplasm of uncertain behavior of skin: Secondary | ICD-10-CM | POA: Diagnosis not present

## 2016-10-28 DIAGNOSIS — L821 Other seborrheic keratosis: Secondary | ICD-10-CM | POA: Diagnosis not present

## 2016-11-02 DIAGNOSIS — N200 Calculus of kidney: Secondary | ICD-10-CM | POA: Diagnosis not present

## 2016-11-09 DIAGNOSIS — C44321 Squamous cell carcinoma of skin of nose: Secondary | ICD-10-CM | POA: Diagnosis not present

## 2016-11-22 ENCOUNTER — Ambulatory Visit (INDEPENDENT_AMBULATORY_CARE_PROVIDER_SITE_OTHER): Payer: PPO | Admitting: Internal Medicine

## 2016-11-22 VITALS — BP 126/62 | HR 64 | Temp 97.3°F | Resp 16 | Ht 71.0 in | Wt 158.2 lb

## 2016-11-22 DIAGNOSIS — H00012 Hordeolum externum right lower eyelid: Secondary | ICD-10-CM | POA: Diagnosis not present

## 2016-11-22 DIAGNOSIS — R972 Elevated prostate specific antigen [PSA]: Secondary | ICD-10-CM

## 2016-11-22 MED ORDER — DOXYCYCLINE HYCLATE 100 MG PO CAPS: ORAL_CAPSULE | ORAL | 0 refills | Status: AC

## 2016-11-22 MED ORDER — NEOMYCIN-POLYMYXIN-DEXAMETH 3.5-10000-0.1 OP OINT: TOPICAL_OINTMENT | OPHTHALMIC | 1 refills | Status: DC

## 2016-11-22 NOTE — Patient Instructions (Signed)

## 2016-11-23 ENCOUNTER — Encounter: Payer: Self-pay | Admitting: Internal Medicine

## 2016-11-23 LAB — PSA, TOTAL AND FREE
PSA, % FREE: 25 % — AB (ref >25)
PSA, FREE: 0.2 ng/mL
PSA, Total: 0.8 ng/mL (ref <=4.0)

## 2016-11-27 ENCOUNTER — Encounter: Payer: Self-pay | Admitting: Internal Medicine

## 2016-11-27 NOTE — Progress Notes (Signed)
[place] [facility]   Unk Pinto, M.D.    Uvaldo Bristle. Silverio Lay, P.A.-C      Starlyn Skeans, P.A.-C  [facility]                [address] [place], [place] [phone] Telephone [phone] Telefax [phone]  Subjective:    Patient ID: John [name], male    DOB: 1935-09-05, 82 y.o.   MRN: [id number]  HPI  This nice 82 yo MWM w/HTN, HLD, ASHD/CABG, diet controlled T2_DM, Vit D def presents with c/o "bump" on his Rt eyelid x 4-5 days and also is in for f/u of elev PSA from 0.34 in 2016 to 9.2 and 10.9 in Jan 2018 whence he was treated for a prostate infection.   Medication Sig  . ALPRAZolam (XANAX) 1 MG tablet TAKE ONE TABLET BY MOUTH THREE TIMES DAILY AS NEEDED FOR ANXIETY  . aspirin 81 MG tablet Take 81 mg by mouth daily.    . Cholecalciferol (VITAMIN D3) 2000 UNITS capsule Take 8,000 Units by mouth daily.   . clopidogrel (PLAVIX) 75 MG tablet TAKE ONE TABLET (75 MG TOTAL) BY MOUTH ONCE DAILY  . fludrocortisone (FLORINEF) 0.1 MG tablet Take 1 tablet (0.1 mg total) by mouth 2 (two) times daily. (Patient not taking: Reported on 10/12/2016)  . FREESTYLE LITE test strip TEST twice a day  . Lancets (FREESTYLE) lancets 1 each by Other route as needed for other. Use as instructed  . loperamide (IMODIUM) 2 MG capsule Take 2 mg by mouth as needed for diarrhea or loose stools.  . Melatonin 5 MG CAPS Take 5 mg by mouth at bedtime.  . nitroGLYCERIN (NITROSTAT) 0.4 MG SL tablet Sig: 1 tablet under tongue every 3 to 5 minutes as needed for Angina - Please Dispense # 2 bottles of # 25 tabs  . pantoprazole (PROTONIX) 40 MG tablet TAKE ONE TABLET BY MOUTH ONCE DAILY  . potassium citrate (UROCIT-K) 10 MEQ (1080 MG) SR tablet TAKE ONE TABLET BY MOUTH TWICE DAILY (Patient not taking: Reported on 10/12/2016)  . prochlorperazine (COMPAZINE) 5 MG tablet TAKE 1-2 TABS THREE TIMES A DAY AS NEEDED FOR NAUSEA  . QUEtiapine  (SEROQUEL) 25 MG tablet TAKE ONE TABLET BY MOUTH THREE TIMES DAILY  . simvastatin (ZOCOR) 80 MG tablet TAKE ONE TABLET BY MOUTH AT BEDTIME  . oxazepam (SERAX) 30 MG capsule Take 1 capsule (30 mg total) by mouth at bedtime as needed for sleep or anxiety. (Patient not taking: Reported on 10/12/2016)   No facility-administered medications prior to visit.    Marland Kitchenallewrg Past Medical History:  Diagnosis Date  . Anxiety   . Arthritis    "lower back; left ankle" (03/20/2014)  . ASTHMA   . Blood transfusion    "related to OHS, ulcers"  . CAD, ARTERY BYPASS GRAFT   . Colon polyps    adenomatous polyps  . COPD   . Diverticulosis   . GASTROESOPHAGEAL REFLUX DISEASE   . HYPERLIPIDEMIA   . Insomnia   . Leukodystrophy   . Nephrolithiasis    "I've had over 320 kidney stones" (03/20/2014)  . Sleep apnea    "don't always wear it" (03/20/2014)  . SUPRAVENTRICULAR TACHYCARDIA, HX OF   . TIA   . Ulcer [facility])    Past Surgical History:  Procedure Laterality Date  . ANKLE FRACTURE SURGERY Left ~ 2012  .  CARDIAC CATHETERIZATION    . COLONOSCOPY    . CORONARY ARTERY BYPASS GRAFT  1996   CABG X2  . CYSTOSCOPY WITH URETEROSCOPY, STONE BASKETRY AND STENT PLACEMENT    . Cystourethroscopy with stent removal.    . EXCISIONAL HEMORRHOIDECTOMY    . GASTRECTOMY    . HIATAL HERNIA REPAIR    . IRRIGATION AND DEBRIDEMENT SEBACEOUS CYST     "off my back  . POLYPECTOMY    . PROSTATE SURGERY     "took the center of my prostate out"  . UPPER GASTROINTESTINAL ENDOSCOPY    . VAGOTOMY     Review of Systems  10 point systems review negative except as above.    Objective:   Physical Exam  BP 126/62   Pulse 64   Temp 97.3 F (36.3 C)   Resp 16   Ht 5\' 11"  (1.803 m)   Wt 158 lb 3.2 oz (71.8 kg)   BMI 22.06 kg/m   HEENT - Eac's patent. TM's Nl. EOM's full. PERRLA. Hordeolum noted with also erythema of the lateral lower Rt eyelid.  NasoOroPharynx clear. Neck - supple. Nl Thyroid. Carotids 2+ & No bruits,  nodes, JVD Chest - Clear. Cor - Nl HS. RRR w/o sig M. No edema. MS- FROM w/o deformities. Gait Nl. Neuro - No obvious Cr N abnormalities.  Nl w/o focal abnormalities.    Assessment & Plan:   1. Hordeolum externum of right lower eyelid  - neomycin-polymyxin b-dexamethasone (MAXITROL) 3.5-10000-0.1 OINT; Apply to Sty Right lower eyelid 2 to 3 x/day  Dispense: 3.5 g; Refill: 1  - doxycycline (VIBRAMYCIN) 100 MG capsule; Take 1 capsule 2 x/ day with food for infection  Dispense: 20 capsule; Refill: 0  2. Elevated PSA  - PSA, total and free

## 2016-11-29 ENCOUNTER — Other Ambulatory Visit (INDEPENDENT_AMBULATORY_CARE_PROVIDER_SITE_OTHER): Payer: PPO | Admitting: *Deleted

## 2016-11-29 DIAGNOSIS — Z1212 Encounter for screening for malignant neoplasm of rectum: Secondary | ICD-10-CM

## 2016-11-29 LAB — POC HEMOCCULT BLD/STL (HOME/3-CARD/SCREEN)
Card #2 Fecal Occult Blod, POC: NEGATIVE
FECAL OCCULT BLD: NEGATIVE
FECAL OCCULT BLD: NEGATIVE

## 2016-12-06 ENCOUNTER — Ambulatory Visit: Payer: PPO | Admitting: Internal Medicine

## 2016-12-06 VITALS — BP 114/62 | HR 72 | Temp 97.3°F | Resp 16 | Ht 71.0 in | Wt 157.6 lb

## 2016-12-06 DIAGNOSIS — D485 Neoplasm of uncertain behavior of skin: Secondary | ICD-10-CM

## 2016-12-06 NOTE — Progress Notes (Signed)
   Patient returns today for 2 week f/u of suspect (2)  lesion(s)  of his external R lower eye lid after tx with Doxycycline  & Maxitrol ung.  Examination is still dubious whether this represents a hordeolum vs a BCE of the lower lid -   Dx - suspect lesion of R lower eye lid  Plan - refer back to the [place] for opinion

## 2016-12-15 ENCOUNTER — Encounter: Payer: Self-pay | Admitting: *Deleted

## 2016-12-15 ENCOUNTER — Ambulatory Visit (INDEPENDENT_AMBULATORY_CARE_PROVIDER_SITE_OTHER): Payer: PPO | Admitting: Cardiovascular Disease

## 2016-12-15 ENCOUNTER — Encounter: Payer: Self-pay | Admitting: Cardiovascular Disease

## 2016-12-15 VITALS — BP 106/70 | HR 84 | Ht 71.0 in | Wt 156.0 lb

## 2016-12-15 DIAGNOSIS — E7800 Pure hypercholesterolemia, unspecified: Secondary | ICD-10-CM | POA: Diagnosis not present

## 2016-12-15 DIAGNOSIS — I6523 Occlusion and stenosis of bilateral carotid arteries: Secondary | ICD-10-CM | POA: Diagnosis not present

## 2016-12-15 DIAGNOSIS — I25110 Atherosclerotic heart disease of native coronary artery with unstable angina pectoris: Secondary | ICD-10-CM

## 2016-12-15 DIAGNOSIS — I493 Ventricular premature depolarization: Secondary | ICD-10-CM

## 2016-12-15 NOTE — Progress Notes (Signed)
Chief Complaint  Patient presents with  . Gastroesophageal Reflux    NO CP OR SWELLING.   [name] Shortness of Breath    WITH EXERTION  . Coronary Artery Disease  . Hyperlipidemia  . Follow-up      History of Present Illness: 82 yo male with history of CAD s/p 2 V CABG 1996 , DM, HLD, TIA here today for cardiac follow up. He has been followed in the past by Dr. Lia Foyer. Last cath 2002 with patent LIMA to LAD. He is on Plavix for stroke prevention per primary care. Echo November 2014 with normal LV systolic function, mild AI. Carotid dopplers November 2014 with mild bilateral disease. Stress myoview May 2015 with no ischemia. Admitted to [facility] May 2015 with syncope/diarrhea. Found to have C. Diff and was treated. He was admitted to [facility] in November 2016 and diagnosed with Parkinson's disease.   He is here today for follow up. He has had 4 episodes of chest pain over the last 4 weeks, each lasting 5 minutes and responding to sublingual NTG. No dyspnea. No near syncope or syncope.   Primary Care Physician: Alesia Richards, MD   Past Medical History:  Diagnosis Date  . Anxiety   . Arthritis    "lower back; left ankle" (03/20/2014)  . ASTHMA   . Blood transfusion    "related to OHS, ulcers"  . CAD, ARTERY BYPASS GRAFT   . Colon polyps    adenomatous polyps  . COPD   . Diverticulosis   . GASTROESOPHAGEAL REFLUX DISEASE   . HYPERLIPIDEMIA   . Insomnia   . Leukodystrophy   . Nephrolithiasis    "I've had over 320 kidney stones" (03/20/2014)  . Sleep apnea    "don't always wear it" (03/20/2014)  . SUPRAVENTRICULAR TACHYCARDIA, HX OF   . TIA   . Ulcer [facility])     Past Surgical History:  Procedure Laterality Date  . ANKLE FRACTURE SURGERY Left ~ 2012  . CARDIAC CATHETERIZATION    . COLONOSCOPY    . CORONARY ARTERY BYPASS GRAFT  1996   CABG X2  . CYSTOSCOPY WITH URETEROSCOPY, STONE BASKETRY AND STENT PLACEMENT    . Cystourethroscopy with stent removal.    . EXCISIONAL  HEMORRHOIDECTOMY    . GASTRECTOMY    . HIATAL HERNIA REPAIR    . IRRIGATION AND DEBRIDEMENT SEBACEOUS CYST     "off my back  . POLYPECTOMY    . PROSTATE SURGERY     "took the center of my prostate out"  . UPPER GASTROINTESTINAL ENDOSCOPY    . VAGOTOMY      Current Outpatient Prescriptions  Medication Sig Dispense Refill  . ALPRAZolam (XANAX) 1 MG tablet TAKE ONE TABLET BY MOUTH THREE TIMES DAILY AS NEEDED FOR ANXIETY 270 tablet 0  . aspirin 81 MG tablet Take 81 mg by mouth daily.      . Cholecalciferol (VITAMIN D3) 2000 UNITS capsule Take 8,000 Units by mouth daily.     . clopidogrel (PLAVIX) 75 MG tablet TAKE ONE TABLET (75 MG TOTAL) BY MOUTH ONCE DAILY 90 tablet 1  . FREESTYLE LITE test strip TEST twice a day 150 each PRN  . Lancets (FREESTYLE) lancets 1 each by Other route as needed for other. Use as instructed    . loperamide (IMODIUM) 2 MG capsule Take 2 mg by mouth as needed for diarrhea or loose stools.    . Melatonin 5 MG CAPS Take 5 mg by mouth at bedtime.    [name]  neomycin-polymyxin b-dexamethasone (MAXITROL) 3.5-10000-0.1 OINT Apply to Sty Right lower eyelid 2 to 3 x/day 3.5 g 1  . nitroGLYCERIN (NITROSTAT) 0.4 MG SL tablet Sig: 1 tablet under tongue every 3 to 5 minutes as needed for Angina - Please Dispense # 2 bottles of # 25 tabs 50 tablet 99  . pantoprazole (PROTONIX) 40 MG tablet TAKE ONE TABLET BY MOUTH ONCE DAILY 90 tablet 3  . prochlorperazine (COMPAZINE) 5 MG tablet TAKE 1-2 TABS THREE TIMES A DAY AS NEEDED FOR NAUSEA 50 tablet 0  . QUEtiapine (SEROQUEL) 25 MG tablet TAKE ONE TABLET BY MOUTH THREE TIMES DAILY 90 tablet 0  . simvastatin (ZOCOR) 80 MG tablet TAKE ONE TABLET BY MOUTH AT BEDTIME 90 tablet 1   No current facility-administered medications for this visit.     Allergies  Allergen Reactions  . Gabapentin Other (See Comments)    Severe confusion  . Prozac [Fluoxetine Hcl] Other (See Comments)    Patient state that it does not agree with him  . Soma  [Carisoprodol] Other (See Comments)    Patient stated that it does not agree with him  . Beta Adrenergic Blockers Other (See Comments)    REACTION: Bradycardia    Social History   Social History  . Marital status: Married    Spouse name: N/A  . Number of children: 2  . Years of education: 14   Occupational History  . 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 Smoking status: Former Smoker    Packs/day: 0.50    Types: Cigarettes    Quit date: 10/25/1962  . Smokeless tobacco: Current User    Types: Chew  . Alcohol use No  . Drug use: No  . Sexual activity: Yes   Other Topics Concern  . Not on file   Social History Narrative   Denies caffeine use     Family History  Problem Relation Age of Onset  . Alcohol abuse Father   . Stroke Mother   . Heart disease Brother   . Diabetes Brother   . Heart disease Paternal Grandmother   . Colon cancer Neg Hx   . Stomach cancer Neg Hx     Review of Systems:  As stated in the HPI and otherwise negative.   BP 106/70 (BP Location: Right Arm, Patient Position: Sitting, Cuff Size: Normal)   Pulse 84   Ht 5\' 11"  (1.803 m)   Wt 156 lb (70.8 kg)   SpO2 96%   BMI 21.76 kg/m   Physical Examination: General: Well developed, well nourished, NAD  HEENT: OP clear, mucus membranes moist  SKIN: warm, dry. No rashes. Neuro: No focal deficits  Musculoskeletal: Muscle strength 5/5 all ext  Psychiatric: Mood and affect normal  Neck: No JVD, no carotid bruits, no thyromegaly, no lymphadenopathy.  Lungs:Clear bilaterally, no wheezes, rhonci, crackles Cardiovascular: Regular rate and rhythm. No murmurs, gallops or rubs. Abdomen:Soft. Bowel sounds present. Non-tender.  Extremities: No lower extremity edema. Pulses are 2 + in the bilateral DP/PT  Cardiac cath March 2002:  The left main coronary artery is free of significant  disease.  The LAD coursed to the apex. There was about 80% segmental narrowing at  the  origin of the large diagonal branch. The large diagonal branch itself had an  eccentric 80% plaque.  The internal mammary to the diagonal subsequently going to the LAD was patent.  There was perhaps irregularity beyond the insertion site at the diagonal  leading into the LAD segment but this was not focally significant. Runoff  into both diagonal and LAD distally was without significant narrowing.  The circumflex proper provided both two marginal branches and an AV  circumflex. There was minimal luminal irregularity but no high-grade  stenosis.  The right coronary artery was a dominant vessel providing posterior descending  and posterolateral branch. There is perhaps 30-40% narrowing throughout the  whole proximal and mid segment. However, high-grade focal disease was not  noted and this does not appear to be flow-limiting.  Echo 09/04/13:  Left ventricle: The cavity size was normal. Wall thickness was normal. Systolic function was normal. The estimated ejection fraction was in the range of 50% to 55%. Wall motion was normal; there were no regional wall motion abnormalities. Doppler parameters are consistent with abnormal left ventricular relaxation (grade 1 diastolic dysfunction). - Aortic valve: Mild regurgitation. - Mitral valve: There was mild systolic anterior motion of the chordal structures. - Left atrium: The atrium was mildly dilated.  Stress myoview 03/05/14: Stress Procedure: The patient received IV Lexiscan 0.4 mg over 15-seconds with concurrent low level exercise and then Technetium 72m Sestamibi was injected at 30-seconds while the patient continued walking one more minute. The patient complained of headache with Lexiscan. Quantitative spect images were obtained after a 45-minute delay. Stress ECG: Sinus rhythm with T wave inversion in lead V3 through V6, mild. QPS Raw Data Images: Mild diaphragmatic attenuation. Normal left ventricular size. Stress Images:  There is decreased uptake in the inferior wall. Rest Images: There is decreased uptake in the inferior wall. Subtraction (SDS): No evidence of ischemia. Transient Ischemic Dilatation (Normal <1.22): 1.06 Lung/Heart Ratio (Normal <0.45): 0.25 Quantitative Gated Spect Images QGS EDV: 138 ml QGS ESV: 59 ml Impression Exercise Capacity: Lexiscan with low level exercise. BP Response: Normal blood pressure response. Clinical Symptoms: Typical symptoms with Lexiscan. ECG Impression: Nondiagnostic mild T wave inversion at low level exercise. Comparison with Prior Nuclear Study: No images to compare Overall Impression: Low risk stress nuclear study With no areas of significant ischemia identified. Inferior wall decreased uptake likely secondary to diaphragmatic attenuation.. LV Ejection Fraction: 57%. LV Wall Motion: NL LV Function; NL Wall Motion  EKG:  EKG is not  ordered today.  Recent Labs: 10/12/2016: ALT 13; BUN 23; Creat 1.30; Hemoglobin 12.8; Magnesium 1.4; Platelets 173; Potassium 3.8; Sodium 143; TSH 3.33   Lipid Panel    Component Value Date/Time   CHOL 138 10/12/2016 1427   TRIG 68 10/12/2016 1427   HDL 65 10/12/2016 1427   CHOLHDL 2.1 10/12/2016 1427   VLDL 14 10/12/2016 1427   LDLCALC 59 10/12/2016 1427     Wt Readings from Last 3 Encounters:  12/15/16 156 lb (70.8 kg)  12/06/16 157 lb 9.6 oz (71.5 kg)  11/22/16 158 lb 3.2 oz (71.8 kg)    Other studies Reviewed: Additional studies/ records that were reviewed today include:. Review of the above records demonstrates:    Assessment and Plan:   1.CAD with unstable angina: He is having chest pain worrisome for angina. He is known to have prior bypass (LIMA to Diagonal and LAD) in 1996 with last cath in 2002. He is on ASA, Plavix and statin. He does not tolerate beta blockers. Will arrange cardiac cath at [facility] with possible PCI on 12/23/16. Risks and benefits of cath reviewed with pt. Pre-cath labs today.     2. HLD: On a statin. Lipids well controlled and followed in primary care. No changes.  3. Carotid artery disease: Mild by dopplers today April 2016. Repeat in April 2018.  4. PVCs: No symptoms. He stopped Cardizem and is doing well.   Current medicines are reviewed at length with the patient today.  The patient does not have concerns regarding medicines.  The following changes have been made:  no change  Labs/ tests ordered today include: None  Orders Placed This Encounter  Procedures  . CBC w/Diff  . Basic Metabolic Panel (BMET)  . INR/PT    Disposition:   FU with me after his cath.   Signed, Lauree Chandler, MD 12/15/2016 5:18 PM    [facility] [address], [place], [place]  [address] Phone: [phone]; Fax: [phone]

## 2016-12-15 NOTE — Patient Instructions (Addendum)
Medication Instructions:  Your physician recommends that you continue on your current medications as directed. Please refer to the Current Medication list given to you today.   Labwork: Lab work to be done today--BMP, CBC, PT  Testing/Procedures: Your physician has requested that you have a cardiac catheterization. Cardiac catheterization is used to diagnose and/or treat various heart conditions. Doctors may recommend this procedure for a number of different reasons. The most common reason is to evaluate chest pain. Chest pain can be a symptom of coronary artery disease (CAD), and cardiac catheterization can show whether plaque is narrowing or blocking your heart's arteries. This procedure is also used to evaluate the valves, as well as measure the blood flow and oxygen levels in different parts of your heart. For further information please visit [email or web address]. Please follow instruction sheet, as given.    Follow-Up: Your physician recommends that you schedule a follow-up appointment in: one month with PA or NP. Scheduled for March 22,2018 at 1:15 with Ermalinda Barrios, PA    Any Other Special Instructions Will Be Listed Below (If Applicable).     If you need a refill on your cardiac medications before your next appointment, please call your pharmacy.

## 2016-12-16 ENCOUNTER — Other Ambulatory Visit: Payer: Self-pay | Admitting: Internal Medicine

## 2016-12-16 LAB — CBC WITH DIFFERENTIAL/PLATELET
BASOS: 1 %
Basophils Absolute: 0 10*3/uL (ref 0.0–0.2)
EOS (ABSOLUTE): 0.1 10*3/uL (ref 0.0–0.4)
EOS: 2 %
HEMATOCRIT: 39.2 % (ref 37.5–51.0)
Hemoglobin: 13.4 g/dL (ref 13.0–17.7)
Immature Grans (Abs): 0 10*3/uL (ref 0.0–0.1)
Immature Granulocytes: 0 %
LYMPHS ABS: 1.8 10*3/uL (ref 0.7–3.1)
Lymphs: 35 %
MCH: 31.5 pg (ref 26.6–33.0)
MCHC: 34.2 g/dL (ref 31.5–35.7)
MCV: 92 fL (ref 79–97)
MONOS ABS: 0.4 10*3/uL (ref 0.1–0.9)
Monocytes: 7 %
NEUTROS PCT: 55 %
Neutrophils Absolute: 2.8 10*3/uL (ref 1.4–7.0)
PLATELETS: 206 10*3/uL (ref 150–379)
RBC: 4.25 x10E6/uL (ref 4.14–5.80)
RDW: 13.4 % (ref 12.3–15.4)
WBC: 5 10*3/uL (ref 3.4–10.8)

## 2016-12-16 LAB — BASIC METABOLIC PANEL
BUN / CREAT RATIO: 19 (ref 10–24)
BUN: 21 mg/dL (ref 8–27)
CHLORIDE: 103 mmol/L (ref 96–106)
CO2: 22 mmol/L (ref 18–29)
Calcium: 9.1 mg/dL (ref 8.6–10.2)
Creatinine, Ser: 1.13 mg/dL (ref 0.76–1.27)
GFR, EST AFRICAN AMERICAN: 71 (ref >59)
GFR, EST NON AFRICAN AMERICAN: 61 (ref >59)
Glucose: 91 mg/dL (ref 65–99)
POTASSIUM: 4.2 mmol/L (ref 3.5–5.2)
SODIUM: 144 mmol/L (ref 134–144)

## 2016-12-16 LAB — PROTIME-INR
INR: 1.1 (ref 0.8–1.2)
PROTHROMBIN TIME: 11.5 s (ref 9.1–12.0)

## 2016-12-22 ENCOUNTER — Telehealth: Payer: Self-pay | Admitting: Cardiovascular Disease

## 2016-12-22 NOTE — Telephone Encounter (Signed)
Patient wanted to double check to make sure all of the paperwork is complete to be pre-approved with his insurance company for his catheterization tomorrow. The patient states that his insurance company told him there were a few more questions and that they were going to fax over the paperwork. Spoke with Dorian Pod who states that everything was taken care of on 12/16/16 and that she would call the insurance company to make sure they had everything they needed. Patient made aware and verbalized understanding.

## 2016-12-22 NOTE — Telephone Encounter (Signed)
Returning patient's call. Left a message for patient to call back.

## 2016-12-22 NOTE — Telephone Encounter (Signed)
New message    Pt wants to know if we have everything squared away for his surgery tomorrow with Dr. Angelena Form. He said that his insurance called and told him that they were faxing paperwork for the nurse or Dr to complete and he wants to make sure we got that.

## 2016-12-23 ENCOUNTER — Encounter (HOSPITAL_COMMUNITY)
Admission: RE | Disposition: A | Discharge status: Home / Self Care | Payer: Self-pay | Source: Ambulatory Visit | Attending: Cardiovascular Disease

## 2016-12-23 ENCOUNTER — Other Ambulatory Visit: Payer: Self-pay | Admitting: Internal Medicine

## 2016-12-23 ENCOUNTER — Ambulatory Visit (HOSPITAL_COMMUNITY)
Admission: RE | Admit: 2016-12-23 | Discharge: 2016-12-23 | Disposition: A | Discharge status: Home / Self Care | Payer: PPO | Source: Ambulatory Visit | Attending: Cardiovascular Disease | Admitting: Cardiovascular Disease

## 2016-12-23 DIAGNOSIS — G20 Parkinson's disease: Secondary | ICD-10-CM | POA: Diagnosis not present

## 2016-12-23 DIAGNOSIS — E785 Hyperlipidemia, unspecified: Secondary | ICD-10-CM | POA: Diagnosis not present

## 2016-12-23 DIAGNOSIS — Z823 Family history of stroke: Secondary | ICD-10-CM | POA: Diagnosis not present

## 2016-12-23 DIAGNOSIS — Z8249 Family history of ischemic heart disease and other diseases of the circulatory system: Secondary | ICD-10-CM | POA: Insufficient documentation

## 2016-12-23 DIAGNOSIS — F419 Anxiety disorder, unspecified: Secondary | ICD-10-CM | POA: Diagnosis not present

## 2016-12-23 DIAGNOSIS — M1990 Unspecified osteoarthritis, unspecified site: Secondary | ICD-10-CM | POA: Insufficient documentation

## 2016-12-23 DIAGNOSIS — I2510 Atherosclerotic heart disease of native coronary artery without angina pectoris: Secondary | ICD-10-CM | POA: Diagnosis not present

## 2016-12-23 DIAGNOSIS — Z8673 Personal history of transient ischemic attack (TIA), and cerebral infarction without residual deficits: Secondary | ICD-10-CM | POA: Diagnosis not present

## 2016-12-23 DIAGNOSIS — J449 Chronic obstructive pulmonary disease, unspecified: Secondary | ICD-10-CM | POA: Insufficient documentation

## 2016-12-23 DIAGNOSIS — K219 Gastro-esophageal reflux disease without esophagitis: Secondary | ICD-10-CM | POA: Diagnosis not present

## 2016-12-23 DIAGNOSIS — I25110 Atherosclerotic heart disease of native coronary artery with unstable angina pectoris: Secondary | ICD-10-CM | POA: Diagnosis not present

## 2016-12-23 DIAGNOSIS — R072 Precordial pain: Secondary | ICD-10-CM

## 2016-12-23 DIAGNOSIS — F17220 Nicotine dependence, chewing tobacco, uncomplicated: Secondary | ICD-10-CM | POA: Insufficient documentation

## 2016-12-23 DIAGNOSIS — G4730 Sleep apnea, unspecified: Secondary | ICD-10-CM | POA: Diagnosis not present

## 2016-12-23 DIAGNOSIS — E119 Type 2 diabetes mellitus without complications: Secondary | ICD-10-CM | POA: Insufficient documentation

## 2016-12-23 DIAGNOSIS — Z833 Family history of diabetes mellitus: Secondary | ICD-10-CM | POA: Insufficient documentation

## 2016-12-23 DIAGNOSIS — Z7902 Long term (current) use of antithrombotics/antiplatelets: Secondary | ICD-10-CM | POA: Insufficient documentation

## 2016-12-23 DIAGNOSIS — Z951 Presence of aortocoronary bypass graft: Secondary | ICD-10-CM | POA: Insufficient documentation

## 2016-12-23 DIAGNOSIS — Z7982 Long term (current) use of aspirin: Secondary | ICD-10-CM | POA: Insufficient documentation

## 2016-12-23 DIAGNOSIS — G4700 Insomnia, unspecified: Secondary | ICD-10-CM | POA: Insufficient documentation

## 2016-12-23 HISTORY — PX: LEFT HEART CATH AND CORS/GRAFTS ANGIOGRAPHY: CATH118250

## 2016-12-23 LAB — GLUCOSE, CAPILLARY: GLUCOSE-CAPILLARY: 95 mg/dL (ref 65–99)

## 2016-12-23 SURGERY — LEFT HEART CATH AND CORS/GRAFTS ANGIOGRAPHY
Anesthesia: LOCAL

## 2016-12-23 MED ORDER — VERAPAMIL HCL 2.5 MG/ML IV SOLN
INTRAVENOUS | Status: DC | PRN
  Administered 2016-12-23: 10 mL via INTRA_ARTERIAL

## 2016-12-23 MED ORDER — SODIUM CHLORIDE 0.9 % IV SOLN: 250.0000 mL | INTRAVENOUS | Status: DC | PRN

## 2016-12-23 MED ORDER — SODIUM CHLORIDE 0.9 % IV SOLN: INTRAVENOUS | Status: AC

## 2016-12-23 MED ORDER — SODIUM CHLORIDE 0.9 % IV SOLN
250.0000 mL | INTRAVENOUS | Status: DC | PRN
Start: 2016-12-23 — End: 2016-12-23

## 2016-12-23 MED ORDER — MIDAZOLAM HCL 2 MG/2ML IJ SOLN
INTRAMUSCULAR | Status: AC
  Filled 2016-12-23: qty 2

## 2016-12-23 MED ORDER — SODIUM CHLORIDE 0.9% FLUSH: 3.0000 mL | Freq: Two times a day (BID) | INTRAVENOUS | Status: DC

## 2016-12-23 MED ORDER — HEPARIN (PORCINE) IN NACL 2-0.9 UNIT/ML-% IJ SOLN
INTRAMUSCULAR | Status: AC
  Filled 2016-12-23: qty 1000

## 2016-12-23 MED ORDER — FENTANYL CITRATE (PF) 100 MCG/2ML IJ SOLN
INTRAMUSCULAR | Status: AC
  Filled 2016-12-23: qty 2

## 2016-12-23 MED ORDER — SODIUM CHLORIDE 0.9% FLUSH: 3.0000 mL | INTRAVENOUS | Status: DC | PRN

## 2016-12-23 MED ORDER — IOPAMIDOL (ISOVUE-370) INJECTION 76%
INTRAVENOUS | Status: DC | PRN
  Administered 2016-12-23: 100 mL via INTRA_ARTERIAL

## 2016-12-23 MED ORDER — MIDAZOLAM HCL 2 MG/2ML IJ SOLN
INTRAMUSCULAR | Status: DC | PRN
  Administered 2016-12-23: 1 mg via INTRAVENOUS

## 2016-12-23 MED ORDER — SODIUM CHLORIDE 0.9 % IV SOLN
INTRAVENOUS | Status: AC
  Administered 2016-12-23: 10:00:00 via INTRAVENOUS

## 2016-12-23 MED ORDER — LIDOCAINE HCL (PF) 1 % IJ SOLN
INTRAMUSCULAR | Status: DC | PRN
  Administered 2016-12-23: 2 mL via SUBCUTANEOUS

## 2016-12-23 MED ORDER — FENTANYL CITRATE (PF) 100 MCG/2ML IJ SOLN
INTRAMUSCULAR | Status: DC | PRN
  Administered 2016-12-23: 25 ug via INTRAVENOUS

## 2016-12-23 MED ORDER — HEPARIN SODIUM (PORCINE) 1000 UNIT/ML IJ SOLN
INTRAMUSCULAR | Status: AC
  Filled 2016-12-23: qty 1

## 2016-12-23 MED ORDER — HEPARIN (PORCINE) IN NACL 2-0.9 UNIT/ML-% IJ SOLN
INTRAMUSCULAR | Status: DC | PRN
  Administered 2016-12-23: 1000 mL

## 2016-12-23 MED ORDER — VERAPAMIL HCL 2.5 MG/ML IV SOLN
INTRAVENOUS | Status: AC
  Filled 2016-12-23: qty 2

## 2016-12-23 MED ORDER — HEPARIN SODIUM (PORCINE) 1000 UNIT/ML IJ SOLN
INTRAMUSCULAR | Status: DC | PRN
  Administered 2016-12-23: 3500 [IU] via INTRAVENOUS

## 2016-12-23 MED ORDER — LIDOCAINE HCL (PF) 1 % IJ SOLN
INTRAMUSCULAR | Status: AC
  Filled 2016-12-23: qty 30

## 2016-12-23 MED ORDER — ASPIRIN 81 MG PO CHEW
81.0000 mg | CHEWABLE_TABLET | ORAL | Status: DC
Start: 2016-12-24 — End: 2016-12-23

## 2016-12-23 SURGICAL SUPPLY — 10 items
CATH INFINITI 5FR MULTPACK ANG (CATHETERS) ×2 IMPLANT
DEVICE RAD COMP TR BAND LRG (VASCULAR PRODUCTS) ×2 IMPLANT
GLIDESHEATH SLEND SS 6F .021 (SHEATH) ×2 IMPLANT
GUIDEWIRE INQWIRE 1.5J.035X260 (WIRE) ×1 IMPLANT
INQWIRE 1.5J .035X260CM (WIRE) ×2
KIT HEART LEFT (KITS) ×2 IMPLANT
PACK CARDIAC CATHETERIZATION (CUSTOM PROCEDURE TRAY) ×2 IMPLANT
SYR MEDRAD MARK V 150ML (SYRINGE) ×2 IMPLANT
TRANSDUCER W/STOPCOCK (MISCELLANEOUS) ×2 IMPLANT
TUBING CIL FLEX 10 FLL-RA (TUBING) ×2 IMPLANT

## 2016-12-23 NOTE — Interval H&P Note (Signed)
History and Physical Interval Note:  12/23/2016 9:56 AM  John [name]  has presented today for surgery, with the diagnosis of CAD, unstable angina  The various methods of treatment have been discussed with the patient and family. After consideration of risks, benefits and other options for treatment, the patient has consented to  Procedure(s): Left Heart Cath and Cors/Grafts Angiography (N/A) as a surgical intervention .  The patient's history has been reviewed, patient examined, no change in status, stable for surgery.  I have reviewed the patient's chart and labs.  Questions were answered to the patient's satisfaction.    Cath Lab Visit (complete for each Cath Lab visit)  Clinical Evaluation Leading to the Procedure:   ACS: No.  Non-ACS:    Anginal Classification: CCS III  Anti-ischemic medical therapy: No Therapy  Non-Invasive Test Results: No non-invasive testing performed  Prior CABG: Previous CABG         Lauree Chandler

## 2016-12-23 NOTE — Discharge Instructions (Signed)
Radial Site Care °Refer to this sheet in the next few weeks. These instructions provide you with information about caring for yourself after your procedure. Your health care provider may also give you more specific instructions. Your treatment has been planned according to current medical practices, but problems sometimes occur. Call your health care provider if you have any problems or questions after your procedure. °What can I expect after the procedure? °After your procedure, it is typical to have the following: °· Bruising at the radial site that usually fades within 1-2 weeks. °· Blood collecting in the tissue (hematoma) that may be painful to the touch. It should usually decrease in size and tenderness within 1-2 weeks. °Follow these instructions at home: °· Take medicines only as directed by your health care provider. °· You may shower 24-48 hours after the procedure or as directed by your health care provider. Remove the bandage (dressing) and gently wash the site with plain soap and water. Pat the area dry with a clean towel. Do not rub the site, because this may cause bleeding. °· Do not take baths, swim, or use a hot tub until your health care provider approves. °· Check your insertion site every day for redness, swelling, or drainage. °· Do not apply powder or lotion to the site. °· Do not flex or bend the affected arm for 24 hours or as directed by your health care provider. °· Do not push or pull heavy objects with the affected arm for 24 hours or as directed by your health care provider. °· Do not lift over 10 lb (4.5 kg) for 5 days after your procedure or as directed by your health care provider. °· Ask your health care provider when it is okay to: °¨ Return to work or school. °¨ Resume usual physical activities or sports. °¨ Resume sexual activity. °· Do not drive home if you are discharged the same day as the procedure. Have someone else drive you. °· You may drive 24 hours after the procedure  unless otherwise instructed by your health care provider. °· Do not operate machinery or power tools for 24 hours after the procedure. °· If your procedure was done as an outpatient procedure, which means that you went home the same day as your procedure, a responsible adult should be with you for the first 24 hours after you arrive home. °· Keep all follow-up visits as directed by your health care provider. This is important. °Contact a health care provider if: °· You have a fever. °· You have chills. °· You have increased bleeding from the radial site. Hold pressure on the site. °Get help right away if: °· You have unusual pain at the radial site. °· You have redness, warmth, or swelling at the radial site. °· You have drainage (other than a small amount of blood on the dressing) from the radial site. °· The radial site is bleeding, and the bleeding does not stop after 30 minutes of holding steady pressure on the site. °· Your arm or hand becomes pale, cool, tingly, or numb. °This information is not intended to replace advice given to you by your health care provider. Make sure you discuss any questions you have with your health care provider. °Document Released: 11/13/2010 Document Revised: 03/18/2016 Document Reviewed: 04/29/2014 °Elsevier Interactive Patient Education © 2017 [employer] ° °

## 2016-12-23 NOTE — H&P (View-Only) (Signed)
Chief Complaint  Patient presents with  . Gastroesophageal Reflux    NO CP OR SWELLING.   [name] Shortness of Breath    WITH EXERTION  . Coronary Artery Disease  . Hyperlipidemia  . Follow-up      History of Present Illness: 82 yo male with history of CAD s/p 2 V CABG 1996 , DM, HLD, TIA here today for cardiac follow up. He has been followed in the past by Dr. Lia Foyer. Last cath 2002 with patent LIMA to LAD. He is on Plavix for stroke prevention per primary care. Echo November 2014 with normal LV systolic function, mild AI. Carotid dopplers November 2014 with mild bilateral disease. Stress myoview May 2015 with no ischemia. Admitted to [facility] May 2015 with syncope/diarrhea. Found to have C. Diff and was treated. He was admitted to [facility] in November 2016 and diagnosed with Parkinson's disease.   He is here today for follow up. He has had 4 episodes of chest pain over the last 4 weeks, each lasting 5 minutes and responding to sublingual NTG. No dyspnea. No near syncope or syncope.   Primary Care Physician: Alesia Richards, MD   Past Medical History:  Diagnosis Date  . Anxiety   . Arthritis    "lower back; left ankle" (03/20/2014)  . ASTHMA   . Blood transfusion    "related to OHS, ulcers"  . CAD, ARTERY BYPASS GRAFT   . Colon polyps    adenomatous polyps  . COPD   . Diverticulosis   . GASTROESOPHAGEAL REFLUX DISEASE   . HYPERLIPIDEMIA   . Insomnia   . Leukodystrophy   . Nephrolithiasis    "I've had over 320 kidney stones" (03/20/2014)  . Sleep apnea    "don't always wear it" (03/20/2014)  . SUPRAVENTRICULAR TACHYCARDIA, HX OF   . TIA   . Ulcer [facility])     Past Surgical History:  Procedure Laterality Date  . ANKLE FRACTURE SURGERY Left ~ 2012  . CARDIAC CATHETERIZATION    . COLONOSCOPY    . CORONARY ARTERY BYPASS GRAFT  1996   CABG X2  . CYSTOSCOPY WITH URETEROSCOPY, STONE BASKETRY AND STENT PLACEMENT    . Cystourethroscopy with stent removal.    . EXCISIONAL  HEMORRHOIDECTOMY    . GASTRECTOMY    . HIATAL HERNIA REPAIR    . IRRIGATION AND DEBRIDEMENT SEBACEOUS CYST     "off my back  . POLYPECTOMY    . PROSTATE SURGERY     "took the center of my prostate out"  . UPPER GASTROINTESTINAL ENDOSCOPY    . VAGOTOMY      Current Outpatient Prescriptions  Medication Sig Dispense Refill  . ALPRAZolam (XANAX) 1 MG tablet TAKE ONE TABLET BY MOUTH THREE TIMES DAILY AS NEEDED FOR ANXIETY 270 tablet 0  . aspirin 81 MG tablet Take 81 mg by mouth daily.      . Cholecalciferol (VITAMIN D3) 2000 UNITS capsule Take 8,000 Units by mouth daily.     . clopidogrel (PLAVIX) 75 MG tablet TAKE ONE TABLET (75 MG TOTAL) BY MOUTH ONCE DAILY 90 tablet 1  . FREESTYLE LITE test strip TEST twice a day 150 each PRN  . Lancets (FREESTYLE) lancets 1 each by Other route as needed for other. Use as instructed    . loperamide (IMODIUM) 2 MG capsule Take 2 mg by mouth as needed for diarrhea or loose stools.    . Melatonin 5 MG CAPS Take 5 mg by mouth at bedtime.    [name]  neomycin-polymyxin b-dexamethasone (MAXITROL) 3.5-10000-0.1 OINT Apply to Sty Right lower eyelid 2 to 3 x/day 3.5 g 1  . nitroGLYCERIN (NITROSTAT) 0.4 MG SL tablet Sig: 1 tablet under tongue every 3 to 5 minutes as needed for Angina - Please Dispense # 2 bottles of # 25 tabs 50 tablet 99  . pantoprazole (PROTONIX) 40 MG tablet TAKE ONE TABLET BY MOUTH ONCE DAILY 90 tablet 3  . prochlorperazine (COMPAZINE) 5 MG tablet TAKE 1-2 TABS THREE TIMES A DAY AS NEEDED FOR NAUSEA 50 tablet 0  . QUEtiapine (SEROQUEL) 25 MG tablet TAKE ONE TABLET BY MOUTH THREE TIMES DAILY 90 tablet 0  . simvastatin (ZOCOR) 80 MG tablet TAKE ONE TABLET BY MOUTH AT BEDTIME 90 tablet 1   No current facility-administered medications for this visit.     Allergies  Allergen Reactions  . Gabapentin Other (See Comments)    Severe confusion  . Prozac [Fluoxetine Hcl] Other (See Comments)    Patient state that it does not agree with him  . Soma  [Carisoprodol] Other (See Comments)    Patient stated that it does not agree with him  . Beta Adrenergic Blockers Other (See Comments)    REACTION: Bradycardia    Social History   Social History  . Marital status: Married    Spouse name: N/A  . Number of children: 2  . Years of education: 14   Occupational History  . 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 Smoking status: Former Smoker    Packs/day: 0.50    Types: Cigarettes    Quit date: 10/25/1962  . Smokeless tobacco: Current User    Types: Chew  . Alcohol use No  . Drug use: No  . Sexual activity: Yes   Other Topics Concern  . Not on file   Social History Narrative   Denies caffeine use     Family History  Problem Relation Age of Onset  . Alcohol abuse Father   . Stroke Mother   . Heart disease Brother   . Diabetes Brother   . Heart disease Paternal Grandmother   . Colon cancer Neg Hx   . Stomach cancer Neg Hx     Review of Systems:  As stated in the HPI and otherwise negative.   BP 106/70 (BP Location: Right Arm, Patient Position: Sitting, Cuff Size: Normal)   Pulse 84   Ht 5\' 11"  (1.803 m)   Wt 156 lb (70.8 kg)   SpO2 96%   BMI 21.76 kg/m   Physical Examination: General: Well developed, well nourished, NAD  HEENT: OP clear, mucus membranes moist  SKIN: warm, dry. No rashes. Neuro: No focal deficits  Musculoskeletal: Muscle strength 5/5 all ext  Psychiatric: Mood and affect normal  Neck: No JVD, no carotid bruits, no thyromegaly, no lymphadenopathy.  Lungs:Clear bilaterally, no wheezes, rhonci, crackles Cardiovascular: Regular rate and rhythm. No murmurs, gallops or rubs. Abdomen:Soft. Bowel sounds present. Non-tender.  Extremities: No lower extremity edema. Pulses are 2 + in the bilateral DP/PT  Cardiac cath March 2002:  The left main coronary artery is free of significant  disease.  The LAD coursed to the apex. There was about 80% segmental narrowing at  the  origin of the large diagonal branch. The large diagonal branch itself had an  eccentric 80% plaque.  The internal mammary to the diagonal subsequently going to the LAD was patent.  There was perhaps irregularity beyond the insertion site at the diagonal  leading into the LAD segment but this was not focally significant. Runoff  into both diagonal and LAD distally was without significant narrowing.  The circumflex proper provided both two marginal branches and an AV  circumflex. There was minimal luminal irregularity but no high-grade  stenosis.  The right coronary artery was a dominant vessel providing posterior descending  and posterolateral branch. There is perhaps 30-40% narrowing throughout the  whole proximal and mid segment. However, high-grade focal disease was not  noted and this does not appear to be flow-limiting.  Echo 09/04/13:  Left ventricle: The cavity size was normal. Wall thickness was normal. Systolic function was normal. The estimated ejection fraction was in the range of 50% to 55%. Wall motion was normal; there were no regional wall motion abnormalities. Doppler parameters are consistent with abnormal left ventricular relaxation (grade 1 diastolic dysfunction). - Aortic valve: Mild regurgitation. - Mitral valve: There was mild systolic anterior motion of the chordal structures. - Left atrium: The atrium was mildly dilated.  Stress myoview 03/05/14: Stress Procedure: The patient received IV Lexiscan 0.4 mg over 15-seconds with concurrent low level exercise and then Technetium 81m Sestamibi was injected at 30-seconds while the patient continued walking one more minute. The patient complained of headache with Lexiscan. Quantitative spect images were obtained after a 45-minute delay. Stress ECG: Sinus rhythm with T wave inversion in lead V3 through V6, mild. QPS Raw Data Images: Mild diaphragmatic attenuation. Normal left ventricular size. Stress Images:  There is decreased uptake in the inferior wall. Rest Images: There is decreased uptake in the inferior wall. Subtraction (SDS): No evidence of ischemia. Transient Ischemic Dilatation (Normal <1.22): 1.06 Lung/Heart Ratio (Normal <0.45): 0.25 Quantitative Gated Spect Images QGS EDV: 138 ml QGS ESV: 59 ml Impression Exercise Capacity: Lexiscan with low level exercise. BP Response: Normal blood pressure response. Clinical Symptoms: Typical symptoms with Lexiscan. ECG Impression: Nondiagnostic mild T wave inversion at low level exercise. Comparison with Prior Nuclear Study: No images to compare Overall Impression: Low risk stress nuclear study With no areas of significant ischemia identified. Inferior wall decreased uptake likely secondary to diaphragmatic attenuation.. LV Ejection Fraction: 57%. LV Wall Motion: NL LV Function; NL Wall Motion  EKG:  EKG is not  ordered today.  Recent Labs: 10/12/2016: ALT 13; BUN 23; Creat 1.30; Hemoglobin 12.8; Magnesium 1.4; Platelets 173; Potassium 3.8; Sodium 143; TSH 3.33   Lipid Panel    Component Value Date/Time   CHOL 138 10/12/2016 1427   TRIG 68 10/12/2016 1427   HDL 65 10/12/2016 1427   CHOLHDL 2.1 10/12/2016 1427   VLDL 14 10/12/2016 1427   LDLCALC 59 10/12/2016 1427     Wt Readings from Last 3 Encounters:  12/15/16 156 lb (70.8 kg)  12/06/16 157 lb 9.6 oz (71.5 kg)  11/22/16 158 lb 3.2 oz (71.8 kg)    Other studies Reviewed: Additional studies/ records that were reviewed today include:. Review of the above records demonstrates:    Assessment and Plan:   1.CAD with unstable angina: He is having chest pain worrisome for angina. He is known to have prior bypass (LIMA to Diagonal and LAD) in 1996 with last cath in 2002. He is on ASA, Plavix and statin. He does not tolerate beta blockers. Will arrange cardiac cath at [facility] with possible PCI on 12/23/16. Risks and benefits of cath reviewed with pt. Pre-cath labs today.     2. HLD: On a statin. Lipids well controlled and followed in primary care. No changes.  3. Carotid artery disease: Mild by dopplers today April 2016. Repeat in April 2018.  4. PVCs: No symptoms. He stopped Cardizem and is doing well.   Current medicines are reviewed at length with the patient today.  The patient does not have concerns regarding medicines.  The following changes have been made:  no change  Labs/ tests ordered today include: None  Orders Placed This Encounter  Procedures  . CBC w/Diff  . Basic Metabolic Panel (BMET)  . INR/PT    Disposition:   FU with me after his cath.   Signed, Lauree Chandler, MD 12/15/2016 5:18 PM    [facility] [address], [place], [place]  [address] Phone: [phone]; Fax: [phone]

## 2016-12-24 ENCOUNTER — Encounter (HOSPITAL_COMMUNITY): Payer: Self-pay | Admitting: Cardiovascular Disease

## 2016-12-28 ENCOUNTER — Encounter: Payer: Self-pay | Admitting: *Deleted

## 2017-01-11 ENCOUNTER — Encounter: Payer: Self-pay | Admitting: Cardiology

## 2017-01-11 ENCOUNTER — Ambulatory Visit (INDEPENDENT_AMBULATORY_CARE_PROVIDER_SITE_OTHER): Payer: PPO | Admitting: Cardiology

## 2017-01-11 VITALS — BP 118/58 | HR 73 | Ht 71.0 in | Wt 157.2 lb

## 2017-01-11 DIAGNOSIS — I493 Ventricular premature depolarization: Secondary | ICD-10-CM | POA: Diagnosis not present

## 2017-01-11 DIAGNOSIS — E7800 Pure hypercholesterolemia, unspecified: Secondary | ICD-10-CM

## 2017-01-11 DIAGNOSIS — I25110 Atherosclerotic heart disease of native coronary artery with unstable angina pectoris: Secondary | ICD-10-CM

## 2017-01-11 DIAGNOSIS — I10 Essential (primary) hypertension: Secondary | ICD-10-CM

## 2017-01-11 DIAGNOSIS — E782 Mixed hyperlipidemia: Secondary | ICD-10-CM

## 2017-01-11 NOTE — Progress Notes (Signed)
Cardiology Office Note    Date:  01/11/2017   ID:  RAFORD BRISSETT, DOB [birth year], MRN [id number]  PCP:  Alesia Richards, MD  Cardiologist: primary Dr Angelena Form, seen today by Ena Dawley, MD   Chief complain: post hospitalization visit  History of Present Illness:  John [name] is a 82 y.o. male with history of CAD s/p 2 V CABG 1996 , DM, HLD, TIA followed by Dr Angelena Form for CAD. Cath 2002 with patent LIMA to LAD. He is on Plavix for stroke prevention per primary care. Echo November 2014 with normal LV systolic function, mild AI. Carotid dopplers November 2014 with mild bilateral disease. Stress myoview May 2015 with no ischemia. Admitted to [facility] May 2015 with syncope/diarrhea. Found to have C. Diff and was treated. He was admitted to [facility] in November 2016 and diagnosed with Parkinson's disease.  Seen on 12/15/2016 and Complained of 4 episodes of chest pain over the last 4 weeks, each lasting 5 minutes and responding to sublingual NTG. No dyspnea. No near syncope or syncope. He was scheduled for left cardiac cath on March 1 that showed  1. Single vessel CAD s/p 2V CABG 2. Moderately severe stenosis mid LAD with patent LIMA to LAD and Diagonal. There is antegrade flow down the LAD and competitive flow from the graft.  3. Mild disease in the RCA 4. Normal LV systolic function  He is coming for posthospitalization follow-up. Since the discharge he has had one episode of nonexertional chest pain on March 4 that resolved with sublingual nitroglycerin within 2-3 minutes. No more chest pain since then. He is able to do all activities of daily living including taking care of his yard with no dyspnea on exertion. He has been compliant with his medications denies any dizziness or falls. No lower extremity edema. No muscle pain with simvastatin.   Past Medical History:  Diagnosis Date  . Anxiety   . Arthritis    "lower back; left ankle" (03/20/2014)  . ASTHMA   . Blood transfusion     "related to OHS, ulcers"  . CAD, ARTERY BYPASS GRAFT   . Colon polyps    adenomatous polyps  . COPD   . Diverticulosis   . GASTROESOPHAGEAL REFLUX DISEASE   . HYPERLIPIDEMIA   . Insomnia   . Leukodystrophy   . Nephrolithiasis    "I've had over 320 kidney stones" (03/20/2014)  . Sleep apnea    "don't always wear it" (03/20/2014)  . SUPRAVENTRICULAR TACHYCARDIA, HX OF   . TIA   . Ulcer [facility])     Past Surgical History:  Procedure Laterality Date  . ANKLE FRACTURE SURGERY Left ~ 2012  . CARDIAC CATHETERIZATION    . COLONOSCOPY    . CORONARY ARTERY BYPASS GRAFT  1996   CABG X2  . CYSTOSCOPY WITH URETEROSCOPY, STONE BASKETRY AND STENT PLACEMENT    . Cystourethroscopy with stent removal.    . EXCISIONAL HEMORRHOIDECTOMY    . GASTRECTOMY    . HIATAL HERNIA REPAIR    . IRRIGATION AND DEBRIDEMENT SEBACEOUS CYST     "off my back  . LEFT HEART CATH AND CORS/GRAFTS ANGIOGRAPHY N/A 12/23/2016   Procedure: Left Heart Cath and Cors/Grafts Angiography;  Surgeon: Burnell Blanks, MD;  Location: [facility];  Service: Cardiovascular;  Laterality: N/A;  . POLYPECTOMY    . PROSTATE SURGERY     "took the center of my prostate out"  . UPPER GASTROINTESTINAL ENDOSCOPY    . VAGOTOMY  Current Medications: Outpatient Medications Prior to Visit  Medication Sig Dispense Refill  . ALPRAZolam (XANAX) 1 MG tablet TAKE ONE TABLET BY MOUTH THREE TIMES DAILY AS NEEDED FOR ANXIETY (Patient taking differently: Take 1mg  daily at night) 270 tablet 0  . aspirin 81 MG tablet Take 81 mg by mouth daily.      . Cholecalciferol (VITAMIN D3) 2000 UNITS capsule Take 8,000 Units by mouth daily.     . clopidogrel (PLAVIX) 75 MG tablet TAKE ONE TABLET BY MOUTH ONCE DAILY 90 tablet 1  . FREESTYLE LITE test strip TEST twice a day 150 each PRN  . Lancets (FREESTYLE) lancets 1 each by Other route as needed for other. Use as instructed    . loperamide (IMODIUM) 2 MG capsule Take 4 mg by mouth as needed  for diarrhea or loose stools.     . Melatonin 5 MG CAPS Take 10 mg by mouth at bedtime.     [name] neomycin-polymyxin b-dexamethasone (MAXITROL) 3.5-10000-0.1 OINT Apply to Sty Right lower eyelid 2 to 3 x/day 3.5 g 1  . nitroGLYCERIN (NITROSTAT) 0.4 MG SL tablet Sig: 1 tablet under tongue every 3 to 5 minutes as needed for Angina - Please Dispense # 2 bottles of # 25 tabs 50 tablet 99  . pantoprazole (PROTONIX) 40 MG tablet TAKE ONE TABLET BY MOUTH ONCE DAILY 90 tablet 3  . potassium citrate (UROCIT-K 10) 10 MEQ (1080 MG) SR tablet Take 10 mEq by mouth daily.    . prochlorperazine (COMPAZINE) 5 MG tablet TAKE 1-2 TABS THREE TIMES A DAY AS NEEDED FOR NAUSEA 50 tablet 0  . QUEtiapine (SEROQUEL) 25 MG tablet TAKE ONE TABLET BY MOUTH THREE TIMES DAILY (Patient taking differently: Take 25mg s at night) 90 tablet 0  . simvastatin (ZOCOR) 80 MG tablet TAKE ONE TABLET BY MOUTH AT BEDTIME (Patient taking differently: Take 40mg s on Mondays, Wednesdays, and Fridays) 90 tablet 1   No facility-administered medications prior to visit.      Allergies:   Gabapentin; Prozac [fluoxetine hcl]; Soma [carisoprodol]; and Beta adrenergic blockers   Social History   Social History  . Marital status: Married    Spouse name: N/A  . Number of children: 2  . Years of education: 14   Occupational History  . 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 Smoking status: Former Smoker    Packs/day: 0.50    Types: Cigarettes    Quit date: 10/25/1962  . Smokeless tobacco: Current User    Types: Chew  . Alcohol use No  . Drug use: No  . Sexual activity: Yes   Other Topics Concern  . None   Social History Narrative   Denies caffeine use      Family History:  The patient's family history includes Alcohol abuse in his father; Diabetes in his brother; Heart disease in his brother and paternal grandmother; Stroke in his mother.   ROS:   Please see the history of present illness.      ROS All other systems reviewed and are negative.  PHYSICAL EXAM:   VS:  BP (!) 118/58   Pulse 73   Ht 5\' 11"  (1.803 m)   Wt 157 lb 3.2 oz (71.3 kg)   SpO2 100%   BMI 21.92 kg/m    GEN: Well nourished, well developed, in no acute distress  HEENT: normal  Neck: no JVD, carotid bruits, or masses Cardiac: RRR; no murmurs, rubs, or gallops,no edema  Respiratory:  clear to auscultation bilaterally, normal work of breathing GI: soft, nontender, nondistended, + BS MS: no deformity or atrophy  Skin: warm and dry, no rash Neuro:  Alert and Oriented x 3, Strength and sensation are intact Psych: euthymic mood, full affect  Wt Readings from Last 3 Encounters:  01/11/17 157 lb 3.2 oz (71.3 kg)  12/23/16 153 lb (69.4 kg)  12/15/16 156 lb (70.8 kg)      Studies/Labs Reviewed:   EKG:  EKG is ordered today.  The ekg ordered Was personally reviewed and showed normal sinus rhythm with LVH and nonspecific ST-T wave abnormalities. When compared to prior EKG from 10/12/2016 negative T waves in the lateral leads are no longer present.  Recent Labs: 10/12/2016: ALT 13; Hemoglobin 12.8; Magnesium 1.4; TSH 3.33 12/15/2016: BUN 21; Creatinine, Ser 1.13; Platelets 206; Potassium 4.2; Sodium 144   Lipid Panel    Component Value Date/Time   CHOL 138 10/12/2016 1427   TRIG 68 10/12/2016 1427   HDL 65 10/12/2016 1427   CHOLHDL 2.1 10/12/2016 1427   VLDL 14 10/12/2016 1427   LDLCALC 59 10/12/2016 1427    Additional studies/ records that were reviewed today include:      ASSESSMENT:    1. Essential hypertension   2. Coronary artery disease involving native coronary artery of native heart with unstable angina pectoris ([place])   3. Pure hypercholesterolemia   4. PVC (premature ventricular contraction)   5. Mixed hyperlipidemia      PLAN:  In order of problems listed above:  1.CAD, s/p LHC on 12/23/2016 that showed Single vessel CAD s/p 2V CABG, Moderately severe stenosis mid LAD with patent  LIMA to LAD and Diagonal, antegrade flow down the LAD and competitive flow from the graft, Mild disease in the RCA and Normal LV systolic function - Continue aspirin and Plavix, sublingual nitroglycerin when necessary, pantoprazole S some of his symptoms might be related to GERD and his aphasia spasm. Continue simvastatin. He does not tolerate beta blockers.   2. HLD: On a statin. Lipids well controlled and followed in primary care. No changes.   3. Carotid artery disease: Mild by dopplers today April 2016. Repeat in April 2018.  4. PVCs: No symptoms. He stopped Cardizem and is doing well.  Medication Adjustments/Labs and Tests Ordered: Current medicines are reviewed at length with the patient today.  Concerns regarding medicines are outlined above.  Medication changes, Labs and Tests ordered today are listed in the Patient Instructions below. Patient Instructions  Medication Instructions:   Your physician recommends that you continue on your current medications as directed. Please refer to the Current Medication list given to you today.    Follow-Up:  Your physician wants you to follow-up in: 6 MONTHS WITH DR. Angelena Form You will receive a reminder letter in the mail two months in advance. If you don't receive a letter, please call our office to schedule the follow-up appointment.         If you need a refill on your cardiac medications before your next appointment, please call your pharmacy.      Signed, Ena Dawley, MD  01/11/2017 1:51 PM    [facility] [place], [place], [place]  [address] Phone: [phone]; Fax: [phone]

## 2017-01-11 NOTE — Patient Instructions (Addendum)
Medication Instructions:   Your physician recommends that you continue on your current medications as directed. Please refer to the Current Medication list given to you today.    Follow-Up:  Your physician wants you to follow-up in: 6 MONTHS WITH DR. MCALHANY You will receive a reminder letter in the mail two months in advance. If you don't receive a letter, please call our office to schedule the follow-up appointment.        If you need a refill on your cardiac medications before your next appointment, please call your pharmacy.   

## 2017-01-13 ENCOUNTER — Ambulatory Visit: Payer: PPO | Admitting: Physician Assistant

## 2017-01-18 ENCOUNTER — Other Ambulatory Visit: Payer: Self-pay | Admitting: Internal Medicine

## 2017-01-20 ENCOUNTER — Ambulatory Visit (INDEPENDENT_AMBULATORY_CARE_PROVIDER_SITE_OTHER): Payer: PPO | Admitting: Internal Medicine

## 2017-01-20 ENCOUNTER — Encounter: Payer: Self-pay | Admitting: Internal Medicine

## 2017-01-20 VITALS — BP 118/62 | HR 86 | Temp 98.2°F | Resp 14 | Ht 71.0 in | Wt 155.0 lb

## 2017-01-20 DIAGNOSIS — Q2733 Arteriovenous malformation of digestive system vessel: Secondary | ICD-10-CM

## 2017-01-20 DIAGNOSIS — E559 Vitamin D deficiency, unspecified: Secondary | ICD-10-CM

## 2017-01-20 DIAGNOSIS — J449 Chronic obstructive pulmonary disease, unspecified: Secondary | ICD-10-CM | POA: Diagnosis not present

## 2017-01-20 DIAGNOSIS — K269 Duodenal ulcer, unspecified as acute or chronic, without hemorrhage or perforation: Secondary | ICD-10-CM

## 2017-01-20 DIAGNOSIS — I471 Supraventricular tachycardia: Secondary | ICD-10-CM

## 2017-01-20 DIAGNOSIS — Z79899 Other long term (current) drug therapy: Secondary | ICD-10-CM

## 2017-01-20 DIAGNOSIS — Z6821 Body mass index (BMI) 21.0-21.9, adult: Secondary | ICD-10-CM

## 2017-01-20 DIAGNOSIS — I10 Essential (primary) hypertension: Secondary | ICD-10-CM | POA: Diagnosis not present

## 2017-01-20 DIAGNOSIS — K2100 Gastro-esophageal reflux disease with esophagitis, without bleeding: Secondary | ICD-10-CM

## 2017-01-20 DIAGNOSIS — K219 Gastro-esophageal reflux disease without esophagitis: Secondary | ICD-10-CM

## 2017-01-20 DIAGNOSIS — R6889 Other general symptoms and signs: Secondary | ICD-10-CM | POA: Diagnosis not present

## 2017-01-20 DIAGNOSIS — N182 Chronic kidney disease, stage 2 (mild): Secondary | ICD-10-CM

## 2017-01-20 DIAGNOSIS — E1122 Type 2 diabetes mellitus with diabetic chronic kidney disease: Secondary | ICD-10-CM

## 2017-01-20 DIAGNOSIS — I25812 Atherosclerosis of bypass graft of coronary artery of transplanted heart without angina pectoris: Secondary | ICD-10-CM

## 2017-01-20 DIAGNOSIS — E782 Mixed hyperlipidemia: Secondary | ICD-10-CM

## 2017-01-20 DIAGNOSIS — Z87442 Personal history of urinary calculi: Secondary | ICD-10-CM

## 2017-01-20 DIAGNOSIS — R296 Repeated falls: Secondary | ICD-10-CM

## 2017-01-20 DIAGNOSIS — Z86010 Personal history of colonic polyps: Secondary | ICD-10-CM

## 2017-01-20 DIAGNOSIS — G459 Transient cerebral ischemic attack, unspecified: Secondary | ICD-10-CM

## 2017-01-20 DIAGNOSIS — R072 Precordial pain: Secondary | ICD-10-CM

## 2017-01-20 DIAGNOSIS — Z0001 Encounter for general adult medical examination with abnormal findings: Secondary | ICD-10-CM

## 2017-01-20 DIAGNOSIS — Z0000 Encounter for general adult medical examination without abnormal findings: Secondary | ICD-10-CM

## 2017-01-20 DIAGNOSIS — K31819 Angiodysplasia of stomach and duodenum without bleeding: Secondary | ICD-10-CM

## 2017-01-20 LAB — BASIC METABOLIC PANEL WITH GFR
BUN: 19 mg/dL (ref 7–25)
CHLORIDE: 109 mmol/L (ref 98–110)
CO2: 25 mmol/L (ref 20–31)
Calcium: 9.4 mg/dL (ref 8.6–10.3)
Creat: 1.37 mg/dL — ABNORMAL HIGH (ref 0.70–1.11)
GFR, EST AFRICAN AMERICAN: 56 mL/min — AB (ref >=60)
GFR, EST NON AFRICAN AMERICAN: 48 mL/min — AB (ref >=60)
GLUCOSE: 84 mg/dL (ref 65–99)
POTASSIUM: 4.4 mmol/L (ref 3.5–5.3)
Sodium: 143 mmol/L (ref 135–146)

## 2017-01-20 LAB — HEPATIC FUNCTION PANEL
ALK PHOS: 44 U/L (ref 40–115)
ALT: 20 U/L (ref 9–46)
AST: 20 U/L (ref 10–35)
Albumin: 3.6 g/dL (ref 3.6–5.1)
BILIRUBIN INDIRECT: 0.5 mg/dL (ref 0.2–1.2)
Bilirubin, Direct: 0.2 mg/dL (ref <=0.2)
Total Bilirubin: 0.7 mg/dL (ref 0.2–1.2)
Total Protein: 6.5 g/dL (ref 6.1–8.1)

## 2017-01-20 NOTE — Progress Notes (Signed)
MEDICARE ANNUAL WELLNESS VISIT AND FOLLOW UP Assessment:    1. Medication management -due at next visit - CBC with Differential/Platelet - BASIC METABOLIC PANEL WITH GFR - Hepatic function panel  2. 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 Angelena Form  3. Essential hypertension -well controlled -dash diet -exercise as tolerated -monitor at home -cont medications  4. Gastric AV malformation -followed by GI  5. Transient cerebral ischemia, unspecified type -followed by neurology -LDL at goal -cont plavix -cont ASA  6. Chronic obstructive pulmonary disease, unspecified COPD type ([place]) -former smoker -not currently on inhalers  7. Duodenal ulcer without hemorrhage or perforation -cont protonix -avoid trigger foods  8. Gastroesophageal reflux disease, esophagitis presence not specified -cont protonix  9. GERD -cont protonix -likely source of reflux  10. Type 2 diabetes mellitus with stage 2 chronic kidney disease, without long-term current use of insulin (HCC) -diet controlled  11. Stage 2 chronic kidney disease -cont adequate hydration  12. Body mass index (BMI) of 21.0-21.9 in adult -cont walking -well controlled  13. Frequent falls -followed by neurology -has parkisons like syndrome which causes him to be more off balance.  14. Mixed hyperlipidemia -cont statin -check lipid panel next visit  15. NEPHROLITHIASIS, HX OF -not currently symptomatic  16. History of colonic polyps -followed by GI  17. Precordial pain -resolved with protonix   18. SVT (supraventricular tachycardia) (HCC) -no longer symptomatic -rate controlled  19. Vitamin D deficiency -cont Vit D  Over 30 minutes of exam, counseling, chart review, and critical decision making was performed  Future Appointments Date Time Provider [place]  02/23/2017 1:00 PM Star Age, MD [facility] None   05/03/2017 2:30 PM Unk Pinto, MD [facility] None  11/08/2017 2:00 PM Unk Pinto, MD [facility] None     Plan:   During the course of the visit the patient was educated and counseled about appropriate screening and preventive services including:    Pneumococcal vaccine   Influenza vaccine  Prevnar 13  Td vaccine  Screening electrocardiogram  Colorectal cancer screening  Diabetes screening  Glaucoma screening  Nutrition counseling    Subjective:  John [name] is a 82 y.o. male who presents for Medicare Annual Wellness Visit and 3 month follow up for HTN, hyperlipidemia, prediabetes, and vitamin D Def.   His blood pressure has been controlled at home, today their BP is BP: 118/62 He does workout. He denies chest pain, shortness of breath, dizziness.  He reports that he is walking a lot lately.  He reports that he is doing well.  He has a normal heart cath.  They felt that chest pain was likely coming form esophageal spasms.  They started protonix and he has had no further pains since that time.     He is on cholesterol medication and denies myalgias. His cholesterol is at goal. The cholesterol last visit was:   Lab Results  Component Value Date   CHOL 138 10/12/2016   HDL 65 10/12/2016   LDLCALC 59 10/12/2016   TRIG 68 10/12/2016   CHOLHDL 2.1 10/12/2016   Patient has a history of diet controlled diabetes with sudden unexpected hypoglycemic episodes.  He is not currently having any issues with hypoglycemia.  He reports that he is exercising as often as possile.   :  Lab Results  Component Value Date   HGBA1C 5.0 10/12/2016  His blood sugars have been more stable recently.  He is eating small meals  frequently.   Last GFR Lab Results  Component Value Date   GFRNONAA 61 12/15/2016     Lab Results  Component Value Date   GFRAA 71 12/15/2016   Patient is on Vitamin D supplement.   Lab Results  Component Value Date   VD25OH 72 10/12/2016      He reports that he does see the neurologist in May.  He is not taking the sinemet any more due to confusions.  No problems with falls.      Medication Review: Current Outpatient Prescriptions on File Prior to Visit  Medication Sig Dispense Refill  . ALPRAZolam (XANAX) 1 MG tablet TAKE ONE TABLET BY MOUTH THREE TIMES DAILY AS NEEDED FOR ANXIETY (Patient taking differently: Take 1mg  daily at night) 270 tablet 0  . aspirin 81 MG tablet Take 81 mg by mouth daily.      . Cholecalciferol (VITAMIN D3) 2000 UNITS capsule Take 8,000 Units by mouth daily.     . clopidogrel (PLAVIX) 75 MG tablet TAKE ONE TABLET BY MOUTH ONCE DAILY 90 tablet 1  . FREESTYLE LITE test strip TEST twice a day 150 each PRN  . Lancets (FREESTYLE) lancets 1 each by Other route as needed for other. Use as instructed    . loperamide (IMODIUM) 2 MG capsule Take 4 mg by mouth as needed for diarrhea or loose stools.     . Melatonin 5 MG CAPS Take 10 mg by mouth at bedtime.     [name] neomycin-polymyxin b-dexamethasone (MAXITROL) 3.5-10000-0.1 OINT Apply to Sty Right lower eyelid 2 to 3 x/day 3.5 g 1  . nitroGLYCERIN (NITROSTAT) 0.4 MG SL tablet Sig: 1 tablet under tongue every 3 to 5 minutes as needed for Angina - Please Dispense # 2 bottles of # 25 tabs 50 tablet 99  . pantoprazole (PROTONIX) 40 MG tablet TAKE ONE TABLET BY MOUTH ONCE DAILY 90 tablet 3  . potassium citrate (UROCIT-K 10) 10 MEQ (1080 MG) SR tablet Take 10 mEq by mouth daily.    . prochlorperazine (COMPAZINE) 5 MG tablet TAKE 1-2 TABS THREE TIMES A DAY AS NEEDED FOR NAUSEA 50 tablet 0  . QUEtiapine (SEROQUEL) 25 MG tablet TAKE ONE TABLET BY MOUTH THREE TIMES DAILY 90 tablet 0  . simvastatin (ZOCOR) 80 MG tablet TAKE ONE TABLET BY MOUTH AT BEDTIME (Patient taking differently: Take 40mg s on Mondays, Wednesdays, and Fridays) 90 tablet 1   No current facility-administered medications on file prior to visit.     Allergies: Allergies  Allergen Reactions  . Gabapentin  Other (See Comments)    Severe confusion  . Prozac [Fluoxetine Hcl] Other (See Comments)    Patient state that it does not agree with him  . Soma [Carisoprodol] Other (See Comments)    Patient stated that it does not agree with him  . Beta Adrenergic Blockers Other (See Comments)    REACTION: Bradycardia    Current Problems (verified) has Type II diabetes mellitus with renal manifestations ([place]); Mixed hyperlipidemia; Essential hypertension; ASCAD/CABG; Transient cerebral ischemia; ASTHMA; COPD (chronic obstructive pulmonary disease) ([place]); GERD; SVT (supraventricular tachycardia) ([place]); NEPHROLITHIASIS, HX OF; History of colonic polyps; Duodenal ulcer without hemorrhage or perforation; Gastric AV malformation; Medication management; Vitamin D deficiency; CKD (chronic kidney disease); Esophageal reflux; Body mass index (BMI) of 21.0-21.9 in adult; Frequent falls; and Precordial pain on his problem list.  Screening Tests Immunization History  Administered Date(s) Administered  . Influenza, High Dose Seasonal PF 08/13/2013, 08/05/2014, 07/23/2015, 06/22/2016  . Pneumococcal Conjugate-13 08/27/2014  .  Pneumococcal Polysaccharide-23 08/06/2012  . Tdap 08/01/2013  . Zoster 05/02/2006    Preventative care: Last colonoscopy: 2014  Names of Other Physician/Practitioners you currently use: 1. [facility] here for primary care 2. Dr. Claudean Kinds , eye doctor, last visit 2016 3. Dr. Luretha Rued and Eliezer Bottom, dentist, last visit 2017 Patient Care Team: Unk Pinto, MD as PCP - General Burnell Blanks, MD as Consulting Physician (Cardiology) Inda Castle, MD as Consulting Physician (Gastroenterology) Jannette Spanner, MD as Referring Physician (Dermatology)  Surgical: He  has a past surgical history that includes Cardiac catheterization; Gastrectomy; Vagotomy; Cystourethroscopy with stent removal.; Irrigation and debridement sabaceous cyst;  Colonoscopy; Upper gastrointestinal endoscopy; Excisional hemorrhoidectomy; Polypectomy; Coronary artery bypass graft (1996); Prostate surgery; Cystoscopy with ureteroscopy, stone basketry and stent placement; Ankle fracture surgery (Left, ~ 2012); Hiatal hernia repair; and Left Heart Cath and Cors/Grafts Angiography (N/A, 12/23/2016). Family His family history includes Alcohol abuse in his father; Diabetes in his brother; Heart disease in his brother and paternal grandmother; Stroke in his mother. Social history  He reports that he quit smoking about 54 years ago. His smoking use included Cigarettes. He smoked 0.50 packs per day. His smokeless tobacco use includes Chew. He reports that he does not drink alcohol or use drugs.  MEDICARE WELLNESS OBJECTIVES: Physical activity: Current Exercise Habits: Home exercise routine, Type of exercise: walking, Time (Minutes): 45, Frequency (Times/Week): 7, Weekly Exercise (Minutes/Week): 315, Intensity: Moderate Cardiac risk factors: Cardiac Risk Factors include: advanced age (>92men, >49 women);diabetes mellitus;dyslipidemia Depression/mood screen:   Depression screen [facility] 2/9 01/20/2017  Decreased Interest 0  Down, Depressed, Hopeless 0  PHQ - 2 Score 0    ADLs:  In your present state of health, do you have any difficulty performing the following activities: 01/20/2017 12/23/2016  Hearing? N N  Vision? N N  Difficulty concentrating or making decisions? N N  Walking or climbing stairs? N N  Dressing or bathing? N N  Doing errands, shopping? N -  Preparing Food and eating ? N -  Using the Toilet? N -  In the past six months, have you accidently leaked urine? N -  Do you have problems with loss of bowel control? N -  Managing your Medications? N -  Managing your Finances? N -  Housekeeping or managing your Housekeeping? N -  Some recent data might be hidden     Cognitive Testing  Alert? Yes  Normal Appearance?Yes  Oriented to person? Yes  Place? Yes    Time? Yes  Recall of three objects?  Yes  Can perform simple calculations? Yes  Displays appropriate judgment?Yes  Can read the correct time from a watch face?Yes  EOL planning:     Objective:   Today's Vitals   01/20/17 1444  BP: 118/62  Pulse: 86  Resp: 14  Temp: 98.2 F (36.8 C)  TempSrc: Temporal  Weight: 155 lb (70.3 kg)  Height: 5\' 11"  (1.803 m)   Body mass index is 21.62 kg/m.  General appearance: alert, no distress, WD/WN, male HEENT: normocephalic, sclerae anicteric, TMs pearly, nares patent, no discharge or erythema, pharynx normal Oral cavity: MMM, no lesions Neck: supple, no lymphadenopathy, no thyromegaly, no masses Heart: RRR, normal S1, S2, no murmurs Lungs: CTA bilaterally, no wheezes, rhonchi, or rales Abdomen: +bs, soft, non tender, non distended, no masses, no hepatomegaly, no splenomegaly Musculoskeletal: nontender, no swelling, no obvious deformity Extremities: no edema, no cyanosis, no clubbing Pulses: 2+ symmetric, upper and lower extremities, normal  cap refill Neurological: alert, oriented x 3, CN2-12 intact, strength normal upper extremities and lower extremities, sensation normal throughout, DTRs 2+ throughout, no cerebellar signs, gait normal Psychiatric: normal affect, behavior normal, pleasant   Medicare Attestation I have personally reviewed: The patient's medical and social history Their use of alcohol, tobacco or illicit drugs Their current medications and supplements The patient's functional ability including ADLs,fall risks, home safety risks, cognitive, and hearing and visual impairment Diet and physical activities Evidence for depression or mood disorders  The patient's weight, height, BMI, and visual acuity have been recorded in the chart.  I have made referrals, counseling, and provided education to the patient based on review of the above and I have provided the patient with a written personalized care plan for preventive  services.     Starlyn Skeans, PA-C   01/20/2017

## 2017-01-21 LAB — CBC WITH DIFFERENTIAL/PLATELET
BASOS PCT: 1 %
Basophils Absolute: 54 cells/uL (ref 0–200)
EOS ABS: 108 {cells}/uL (ref 15–500)
Eosinophils Relative: 2 %
HEMATOCRIT: 42.6 % (ref 38.5–50.0)
HEMOGLOBIN: 14.5 g/dL (ref 13.2–17.1)
LYMPHS ABS: 1782 {cells}/uL (ref 850–3900)
Lymphocytes Relative: 33 %
MCH: 33.3 pg — ABNORMAL HIGH (ref 27.0–33.0)
MCHC: 34 g/dL (ref 32.0–36.0)
MCV: 97.9 fL (ref 80.0–100.0)
MONO ABS: 540 {cells}/uL (ref 200–950)
MPV: 10.4 fL (ref 7.5–12.5)
Monocytes Relative: 10 %
Neutro Abs: 2916 cells/uL (ref 1500–7800)
Neutrophils Relative %: 54 %
Platelets: 198 10*3/uL (ref 140–400)
RBC: 4.35 MIL/uL (ref 4.20–5.80)
RDW: 14.1 % (ref 11.0–15.0)
WBC: 5.4 10*3/uL (ref 3.8–10.8)

## 2017-01-24 ENCOUNTER — Encounter: Payer: Self-pay | Admitting: *Deleted

## 2017-01-27 DIAGNOSIS — L570 Actinic keratosis: Secondary | ICD-10-CM | POA: Diagnosis not present

## 2017-01-27 DIAGNOSIS — H0012 Chalazion right lower eyelid: Secondary | ICD-10-CM | POA: Diagnosis not present

## 2017-02-23 ENCOUNTER — Encounter: Payer: Self-pay | Admitting: Psychology

## 2017-02-23 ENCOUNTER — Ambulatory Visit (INDEPENDENT_AMBULATORY_CARE_PROVIDER_SITE_OTHER): Payer: PPO | Admitting: Neurology

## 2017-02-23 ENCOUNTER — Encounter: Payer: Self-pay | Admitting: Neurology

## 2017-02-23 VITALS — BP 112/62 | HR 58 | Resp 14 | Ht 71.0 in | Wt 159.0 lb

## 2017-02-23 DIAGNOSIS — R413 Other amnesia: Secondary | ICD-10-CM | POA: Diagnosis not present

## 2017-02-23 DIAGNOSIS — G214 Vascular parkinsonism: Secondary | ICD-10-CM

## 2017-02-23 NOTE — Patient Instructions (Signed)
We will request a formal neuropsychological test (aka cognitive testing) for your memory complaints. This requires a referral to a trained and licensed neuropsychologist and will be a separate appointment at a different clinic.  If you don't hear back about this appointment within a week, call us back.

## 2017-02-23 NOTE — Progress Notes (Signed)
Subjective:    Patient ID: John [name] is a 82 y.o. male.  HPI     Interim history:   John [name] is an 82 year old right-handed gentleman with an underlying medical history of hypertension, hyperlipidemia, remote history of smoking, current history of chewing tobacco, type 2 diabetes, vitamin D deficiency, coronary artery disease, status post CABG (two-vessel, 1996), reflux disease, depression, chronic kidney disease, history of TIA, asthma, COPD, history of kidney stones, history of SVT, arthritis and obstructive sleep apnea, who presents for follow-up consultation of his parkinsonism. He is unaccompanied today. I last saw him on 08/26/2016, at which time he was still having sleep issues. He was going to sleep okay but not staying asleep as well. He had some short-term memory issues. He was taking melatonin at night, also Xanax 1 mg and Seroquel 25 mg at night, was no longer on Serax. He had no recent falls, he was trying to be more careful. He was on amantadine 100 mg once daily per PCP. I suggested he stay better hydrated with water, I asked him to get an eye check, he was also advised to try to stop the amantadine and see how it goes.  Today, 02/23/2017: He reports doing about the same, came off the amantadine without repercussions or changes. Sleep is better. Stress level okay. Appetite okay. Weight stable. Driving okay, has had no falls recently. Blood from 12/17 was reviewed and looked good.   The patient's allergies, current medications, family history, past medical history, past social history, past surgical history and problem list were reviewed and updated as appropriate.   Previously (copied from previous notes for reference):   I saw him on 03/04/2016, at which time his exam was fairly stable. I suggested we monitor his memory and continue his amantadine at the current dose. Today, 03/04/2016: He had some memory issues. No driving issues, no disorientation, no hallucinations,  but some word finding and enunciation problems, still trouble with sleep, going to sleep and staying asleep. He was on Seroquel 25 mg qHS and 1 mg of Xanax. No recent falls, has had some interim stressor with wife's medical issues.    I saw him on 11/17/2015, at which time he reported no new symptoms and no new complaints. He was taking amantadine 100 mg twice daily, he was trying to hydrate well. He had no recent falls. He felt his memory was not as good since his hospital stay in November 2016 but had no other specific new memory related complaints. He lives with his wife. His exam was fairly stable. We mutually agreed to maintain him on amantadine 100 mg twice daily, exam was not in keeping with idiopathic Parkinson's disease.   I first met him on 09/22/2015 after a fall and a recent hospitalization for this. I kept him on amantadine 100 mg twice daily at the time.   09/22/2015: He was recently admitted to the hospital on 09/10/2015 secondary to encephalopathy. He was discharged on 09/11/2015 and I reviewed the hospital records including the discharge summary. He was admitted from the emergency room after a fall at home. His wife was unable to help him after his fall. He rolled out of bed. He had recurrent falls at home prior to that. CT head without contrast in the emergency room on 09/10/2015 showed: Stable examination with stable periventricular white matter disease and hypodensities in the basal ganglia. No acute posttraumatic findings.   Postadmission he had a brain MRI without contrast on 09/10/2015: Atrophy and chronic  microvascular ischemia.  No acute abnormality. In addition, I personally reviewed the images through the PACS system. I agree with the findings.   During his hospitalization, neurology was consulted and suggested he start amantadine 100 mg twice daily for parkinsonism.    He was discharged to home with home health physical therapy which he is currently enrolled in.     I  reviewed your office note from 09/04/2015.   He has had a tremor in both hands for a few months, some memory loss. His daughter, John [name], reports that he has had some instances of missing a turn recently. He is currently not driving but would like to go back to driving. He has had a history of orthostatic hypotension with improvement after he was started on Florinef several months ago. He has no family history of Parkinson's disease. He feels that the amantadine has helped. He denies any side effects. He has a history of problems sleeping at night and has been on Xanax at night for quite some time. About a year ago he was started on Seroquel at night. He has no history of dream enactments.  His Past Medical History Is Significant For: Past Medical History:  Diagnosis Date  . Anxiety   . Arthritis    "lower back; left ankle" (03/20/2014)  . ASTHMA   . Blood transfusion    "related to OHS, ulcers"  . CAD, ARTERY BYPASS GRAFT   . Colon polyps    adenomatous polyps  . COPD   . Diverticulosis   . GASTROESOPHAGEAL REFLUX DISEASE   . HYPERLIPIDEMIA   . Insomnia   . Leukodystrophy   . Nephrolithiasis    "I've had over 320 kidney stones" (03/20/2014)  . Sleep apnea    "don't always wear it" (03/20/2014)  . SUPRAVENTRICULAR TACHYCARDIA, HX OF   . TIA   . Ulcer     His Past Surgical History Is Significant For: Past Surgical History:  Procedure Laterality Date  . ANKLE FRACTURE SURGERY Left ~ 2012  . CARDIAC CATHETERIZATION    . COLONOSCOPY    . CORONARY ARTERY BYPASS GRAFT  1996   CABG X2  . CYSTOSCOPY WITH URETEROSCOPY, STONE BASKETRY AND STENT PLACEMENT    . Cystourethroscopy with stent removal.    . EXCISIONAL HEMORRHOIDECTOMY    . GASTRECTOMY    . HIATAL HERNIA REPAIR    . IRRIGATION AND DEBRIDEMENT SEBACEOUS CYST     "off my back  . LEFT HEART CATH AND CORS/GRAFTS ANGIOGRAPHY N/A 12/23/2016   Procedure: Left Heart Cath and Cors/Grafts Angiography;  Surgeon: Burnell Blanks,  MD;  Location: [facility];  Service: Cardiovascular;  Laterality: N/A;  . POLYPECTOMY    . PROSTATE SURGERY     "took the center of my prostate out"  . UPPER GASTROINTESTINAL ENDOSCOPY    . VAGOTOMY      His Family History Is Significant For: Family History  Problem Relation Age of Onset  . Alcohol abuse Father   . Stroke Mother   . Heart disease Brother   . Diabetes Brother   . Heart disease Paternal Grandmother   . Colon cancer Neg Hx   . Stomach cancer Neg Hx     His Social History Is Significant For: Social History   Social History  . Marital status: Married    Spouse name: N/A  . Number of children: 2  . Years of education: 14   Occupational History  . 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 Smoking status: Former Smoker    Packs/day: 0.50    Types: Cigarettes    Quit date: 10/25/1962  . Smokeless tobacco: Current User    Types: Chew  . Alcohol use No  . Drug use: No  . Sexual activity: Yes   Other Topics Concern  . None   Social History Narrative   Denies caffeine use     His Allergies Are:  Allergies  Allergen Reactions  . Gabapentin Other (See Comments)    Severe confusion  . Prozac [Fluoxetine Hcl] Other (See Comments)    Patient state that it does not agree with him  . Soma [Carisoprodol] Other (See Comments)    Patient stated that it does not agree with him  . Beta Adrenergic Blockers Other (See Comments)    REACTION: Bradycardia  :   His Current Medications Are:  Outpatient Encounter Prescriptions as of 02/23/2017  Medication Sig  . ALPRAZolam (XANAX) 1 MG tablet TAKE ONE TABLET BY MOUTH THREE TIMES DAILY AS NEEDED FOR ANXIETY (Patient taking differently: Take '1mg'$  daily at night)  . aspirin 81 MG tablet Take 81 mg by mouth daily.    . Cholecalciferol (VITAMIN D3) 2000 UNITS capsule Take 8,000 Units by mouth daily.   . clopidogrel (PLAVIX) 75 MG tablet TAKE ONE TABLET BY MOUTH ONCE DAILY  .  FREESTYLE LITE test strip TEST twice a day  . Lancets (FREESTYLE) lancets 1 each by Other route as needed for other. Use as instructed  . loperamide (IMODIUM) 2 MG capsule Take 4 mg by mouth as needed for diarrhea or loose stools.   . Melatonin 5 MG CAPS Take 10 mg by mouth at bedtime.   [name] neomycin-polymyxin b-dexamethasone (MAXITROL) 3.5-10000-0.1 OINT Apply to Sty Right lower eyelid 2 to 3 x/day  . nitroGLYCERIN (NITROSTAT) 0.4 MG SL tablet Sig: 1 tablet under tongue every 3 to 5 minutes as needed for Angina - Please Dispense # 2 bottles of # 25 tabs  . pantoprazole (PROTONIX) 40 MG tablet TAKE ONE TABLET BY MOUTH ONCE DAILY  . potassium citrate (UROCIT-K 10) 10 MEQ (1080 MG) SR tablet Take 10 mEq by mouth daily.  . prochlorperazine (COMPAZINE) 5 MG tablet TAKE 1-2 TABS THREE TIMES A DAY AS NEEDED FOR NAUSEA  . QUEtiapine (SEROQUEL) 25 MG tablet TAKE ONE TABLET BY MOUTH THREE TIMES DAILY  . simvastatin (ZOCOR) 80 MG tablet TAKE ONE TABLET BY MOUTH AT BEDTIME (Patient taking differently: Take '40mg'$ s on Mondays, Wednesdays, and Fridays)   No facility-administered encounter medications on file as of 02/23/2017.   :  Review of Systems:  Out of a complete 14 point review of systems, all are reviewed and negative with the exception of these symptoms as listed below:  Review of Systems  Neurological:       Patient states that he stopped the Amantadine and feels like it has helps some. He feels that his memory loss is the same as last visit.     Objective:  Neurologic Exam  Physical Exam Physical Examination:   Vitals:   02/23/17 1258  BP: 112/62  Pulse: (!) 58  Resp: 14    General Examination: The patient is a very pleasant 82 y.o. male in no acute distress. He appears well-developed and well-nourished and well groomed.   HEENT: Normocephalic, atraumatic, pupils are equal, round and reactive to light and accommodation. He is status post bilateral cataract repairs. He has eyeglasses but  is not wearing them.  Extraocular tracking is fairly good today, mild saccadic breakdown. He has no change in his stable limitation to upper gaze. He is no significant facial masking or decrease in eye blink rate. Neck is perhaps mildly rigid. He has mild mouth dryness, otherwise stable findings, tongue and palate are central and symmetrical, respectively.   Chest: is clear to auscultation without wheezing, rhonchi or crackles noted.  Heart: sounds are regular and normal without murmurs, rubs or gallops noted.   Abdomen: is soft, non-tender and non-distended with normal bowel sounds appreciated on auscultation.  Extremities: There is no pitting edema in the distal lower extremities bilaterally. Pedal pulses are intact.   Skin: is warm and dry with no trophic changes noted.    Musculoskeletal: exam reveals no obvious joint deformities, tenderness, joint swelling or erythema.  Neurologically:  Mental status: The patient is awake and alert, paying good attention. He is able to give a fairly good history. His memory, attention, language and knowledge are very mildly impaired. There is no aphasia, agnosia, apraxia or anomia. There is a mild degree of bradyphrenia. Speech is mildly hypophonic with no dysarthria noted. Mood is congruent and affect is normal.   On 03/04/2016: MMSE 28/30, CDT: 4/4, AFT: 15/min.  On 08/26/2016: MMSE: 28/30, CDT: 3/4, AFT: 14/min.   On 02/23/2017: MMSE: 29/30, CDT: 4/4, AFT: 13/min.   Cranial nerves are as described above under HEENT exam. In addition, shoulder shrug is normal with equal shoulder height noted.  Motor exam: Normal bulk, and strength for age is noted. There are no dyskinesias noted. Reflexes are 1+ in the UEs, absent in both ankles, trace in the knees. Tone is normal to mildly rigid globally. He has no significant cogwheeling, no resting tremor, no postural or action tremor, fine motor skills mildly impaired globally, no lateralization. He stands up  without significant difficulty, does push himself up but posture is stable, age-appropriate. He walks slowly and cautiously, no one-sided limp or veering to one side.  Assessment and Plan:   In summary, John [name] is a very pleasant 82 year old male with an underlying medical history of hypertension, hyperlipidemia, very remote history of smoking, current history of chewing tobacco, type 2 diabetes, vitamin D deficiency, coronary artery disease, status post CABG (two-vessel, 1996), reflux disease, depression, chronic kidney disease, history of TIA, asthma, COPD, history of kidney stones, history of SVT, arthritis and obstructive sleep apnea, who presents for follow up consultation of his Gait disorder and memory loss. 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 When he was hospitalized in November 2016 after several falls, he was placed on amantadine which we have gradually tapered off. He had no problems coming off of the once daily amantadine last time. He has had some memory loss which is stable. He does have vascular risk factors, denies a family history of Alzheimer's disease. We will monitor his memory. In addition, we will request formal neurocognitive evaluation with neuropsychology. I made a referral in that regard. He is agreeable. We talked about healthy lifestyle, staying active mentally and physically and good nutrition good hydration again today, as well as fall risk. He is sleeping a little better. He had no recent medication changes and had blood work some 4 months ago which I reviewed. I suggested a 6 month follow-up, sooner if needed. I answered all his questions today and he was in agreement.  Of note, he had a head  CT and brain MRI in Nov 2016, which  showed moderate atrophy and moderate white matter changes including hypodensities in both basal ganglia. He is at risk for  vascular parkinsonism and vascular dementia.  I spent 20 minutes in total face-to-face time with the patient, more than 50% of which was spent in counseling and coordination of care, reviewing test results, reviewing medication and discussing or reviewing the diagnosis of memory loss and vascular parkinsonism, prognosis and treatment options. Pertinent laboratory and imaging test results that were available during this visit with the patient were reviewed by me and considered in my medical decision making (see chart for details).

## 2017-03-14 ENCOUNTER — Other Ambulatory Visit: Payer: Self-pay | Admitting: Internal Medicine

## 2017-03-28 ENCOUNTER — Other Ambulatory Visit: Payer: Self-pay | Admitting: *Deleted

## 2017-03-28 MED ORDER — FLUDROCORTISONE ACETATE 0.1 MG PO TABS: 0.1000 mg | ORAL_TABLET | Freq: Two times a day (BID) | ORAL | 1 refills | Status: DC

## 2017-04-13 ENCOUNTER — Other Ambulatory Visit: Payer: Self-pay | Admitting: *Deleted

## 2017-04-13 MED ORDER — QUETIAPINE FUMARATE 25 MG PO TABS: 25.0000 mg | ORAL_TABLET | Freq: Three times a day (TID) | ORAL | 0 refills | Status: DC

## 2017-04-18 ENCOUNTER — Encounter: Payer: PPO | Admitting: Psychology

## 2017-04-18 ENCOUNTER — Ambulatory Visit (INDEPENDENT_AMBULATORY_CARE_PROVIDER_SITE_OTHER): Payer: PPO | Admitting: Psychology

## 2017-04-18 ENCOUNTER — Encounter: Payer: Self-pay | Admitting: Psychology

## 2017-04-18 DIAGNOSIS — R413 Other amnesia: Secondary | ICD-10-CM

## 2017-04-18 NOTE — Progress Notes (Signed)
   Neuropsychology Note  John [name] came in today for 1 hour of neuropsychological testing with technician, Milana Kidney, BS, under the supervision of Dr. Macarthur Critchley. The patient did not appear overtly distressed by the testing session, per behavioral observation or via self-report to the technician. Rest breaks were offered. Mindi Slicker Boer will return within 2 weeks for a feedback session with Dr. Si Raider at which time his test performances, clinical impressions and treatment recommendations will be reviewed in detail. The patient understands he can contact our office should he require our assistance before this time.  Full report to follow.

## 2017-04-18 NOTE — Progress Notes (Signed)
NEUROPSYCHOLOGICAL INTERVIEW (CPT: [id number])  Name: John [name] Date of Birth: 10-22-1935 Date of Interview: 04/18/2017  Reason for Referral:  John [name] is a 82 y.o. right-handed male who is referred for neuropsychological evaluation by Dr. Star Age of [facility] due to concerns about memory loss. This patient is unaccompanied in the office for today's appointment.  History of Presenting Problem:  John [name] has been seeing Dr. Danae Orleans since 09/22/2015, after he was admitted to the hospital on 09/10/2015 for encephalopathy after a fall. He had a prior history of recurrent falls, as well as bilateral hand tremor for a few months, and he was started on amantadine in the hospital for parkinsonism. MMSE's have been stable over time with Dr. Rexene Alberts (scores of 28 and 29 over the past 1 1/2 years). MRI of the brain from 09/10/2015 reportedly revealed moderate atrophy and moderate chronic microvascular ischemic changes in the white matter and pons. Dr. 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 He also has a history of orthostatic hypotension, and a history of sleep difficulty. He has been on Xanax 1 mg at night for a long time, and Seroquel was added for sleep, approximately 3 years ago. He notes that he has a CPAP but does not use it because when he shifts position, it loses its seal and air escapes.  At today's appointment, the patient complains primarily of word finding difficulty. The word usually comes back to him but sometimes not until hours later or the next day. The patient also states that sometimes he can see the word in his head but can't recall how to pronounce it. He has had trouble with this for the last 3 years. 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 He had taught for 54 years. 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   Upon direct questioning, the patient also reported the following with regard to current cognitive functioning:   Forgetting recent conversations/events: Not conversations, sometimes events such as what he ate the night before Repeating statements/questions: Yes "my wife says I do" Misplacing/losing items: No  Difficulty concentrating: No Starting but not finishing tasks: No Processing information more slowly: Yes  Writing difficulty: "I have Parkinson's, my hand shakes, I have to print" - He notices sometimes that his handwriting is smaller but if he concentrates he can make it larger  Getting lost when driving: No Making wrong turns when driving: No Uncertain about directions when driving or passenger: No, and he can use his GPS if he is going somewhere new/unfamiliar  There is no family history of PD or dementia that he knows of; however, he does not know his mother's family history. He notes he was "given away" at age 70 to be raised by his paternal grandmother.  The patient has not had any falls recently but notes several in the past, which he feels were related to low blood sugar. He used to have episodes of severe hypoglycemia. He has hit his head several times in the context of falls and has had to have stitches on his scalp. He states that one time, he passed out due to low blood sugar, and was unconscious for 20 minutes until EMS arrived. He was given glucose and came to. He ate protein and started feeling better.  He chose not to go to the hospital. There was another occasion where he passed out, causing him to fall. He came to quickly, but was unable to get himself up. He was hospitalized after that fall.  He has a history of delirium with hallucinations on two occasions. He reported that during one hospitalization he had a UTI and saw 3 men talking to him in his hospital room who were not really there. He also  reported that during another hospitalization, he apparently thought that a nurse had called security and was going to have him leave, so he took out his IVs and was trying to leave.  Current Functioning: John [name] lives with his wife in their own home. He continues to manage all instrumental ADLs. He denies any difficulty with driving. He manages his medications with a weekly pill planner; he does miss doses occasionally. His daughter assists with management of the finances, and most of his bills are set on auto-pay. He has not missed any bill payments. He has not missed any appointments. He writes them on the calendar and his wife helps remind him. His wife has always done all the cooking. He continues to do yardwork without any difficulty.  Physically, he denies any major complaints. He is able to be active for several hours at a time in his yard, he feels his energy level is relatively good. He does not think his balance is good, but he has been being much more careful when he walks. He recently stopped amantadine and has not noticed any change in walking or physical functioning.   As noted previously, he has a history of sleep difficulties which persist. These are characterized by awakening a few hours after falling asleep and not being able to return to sleep. He is not able to take daytime naps. In the past few nights, however, he has achieved 5-6 hours of sleep which is good for him.  He feels his appetite is pretty good, but he does not eat nearly as much at one time as he used to. He notes that he used to not eat all day until dinner but he realized that was causing his blood sugar to drop so he tries to eat breakfast now.  He reports his mood is good. He does not think he has any history of depression "but my family doctor seems to think I have at times". He does admit to a tendency to worry about things until they are solved. He denies any current stressors. He denies suicidal ideation or  intention.   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 Anxiety   . Arthritis    "lower back; left ankle" (03/20/2014)  . ASTHMA   . Blood transfusion    "related to OHS, ulcers"  . CAD, ARTERY BYPASS GRAFT   . Colon polyps    adenomatous polyps  . COPD   . Diverticulosis   . GASTROESOPHAGEAL REFLUX DISEASE   . HYPERLIPIDEMIA   . Insomnia   . Leukodystrophy   . Nephrolithiasis    "I've had over 320 kidney stones" (03/20/2014)  . Sleep apnea    "  don't always wear it" (03/20/2014)  . SUPRAVENTRICULAR TACHYCARDIA, HX OF   . TIA   . Ulcer      Current Medications:  Outpatient Encounter Prescriptions as of 04/18/2017  Medication Sig  . ALPRAZolam (XANAX) 1 MG tablet TAKE ONE TABLET BY MOUTH THREE TIMES DAILY AS NEEDED FOR ANXIETY (Patient taking differently: Take 1mg  daily at night)  . aspirin 81 MG tablet Take 81 mg by mouth daily.    . Cholecalciferol (VITAMIN D3) 2000 UNITS capsule Take 8,000 Units by mouth daily.   . clopidogrel (PLAVIX) 75 MG tablet TAKE ONE TABLET BY MOUTH ONCE DAILY  . fludrocortisone (FLORINEF) 0.1 MG tablet Take 1 tablet (0.1 mg total) by mouth 2 (two) times daily.  [name] glucose blood (FREESTYLE LITE) test strip Test glucose 1 x / day  . Lancets (FREESTYLE) lancets 1 each by Other route as needed for other. Use as instructed  . loperamide (IMODIUM) 2 MG capsule Take 4 mg by mouth as needed for diarrhea or loose stools.   . Melatonin 5 MG CAPS Take 10 mg by mouth at bedtime.   [name] neomycin-polymyxin b-dexamethasone (MAXITROL) 3.5-10000-0.1 OINT Apply to Sty Right lower  eyelid 2 to 3 x/day  . nitroGLYCERIN (NITROSTAT) 0.4 MG SL tablet Sig: 1 tablet under tongue every 3 to 5 minutes as needed for Angina - Please Dispense # 2 bottles of # 25 tabs  . pantoprazole (PROTONIX) 40 MG tablet TAKE ONE TABLET BY MOUTH ONCE DAILY  . potassium citrate (UROCIT-K 10) 10 MEQ (1080 MG) SR tablet Take 10 mEq by mouth daily.  . prochlorperazine (COMPAZINE) 5 MG tablet TAKE 1-2 TABS THREE TIMES A DAY AS NEEDED FOR NAUSEA  . QUEtiapine (SEROQUEL) 25 MG tablet Take 1 tablet (25 mg total) by mouth 3 (three) times daily.  . simvastatin (ZOCOR) 80 MG tablet TAKE ONE TABLET BY MOUTH AT BEDTIME (Patient taking differently: Take 40mg s on Mondays, Wednesdays, and Fridays)   No facility-administered encounter medications on file as of 04/18/2017.      Behavioral Observations:   Appearance: Neatly and appropriately dressed and groomed Gait: Ambulated independently, no gross abnormalities observed Speech: Fluent; normal rate, rhythm and volume. No word finding difficulty observed during conversational speech. Thought process: Linear, goal directed Affect: Full, euthymic Interpersonal: Pleasant, appropriate   TESTING: There is medical necessity to proceed with neuropsychological assessment as the results will be used to aid in differential diagnosis and clinical decision-making and to inform specific treatment recommendations. Per the patient and medical records reviewed, there has been a change in cognitive functioning and a reasonable suspicion of dementia or mild neurocognitive disorder.  Following the clinical interview, the patient completed a full battery of neuropsychological testing with my psychometrician under my supervision.   PLAN: The patient will return to see me for a follow-up session at which time his test performances and my impressions and treatment recommendations will be reviewed in detail.  Full report to follow.

## 2017-04-28 DIAGNOSIS — Z85828 Personal history of other malignant neoplasm of skin: Secondary | ICD-10-CM | POA: Diagnosis not present

## 2017-04-28 DIAGNOSIS — L814 Other melanin hyperpigmentation: Secondary | ICD-10-CM | POA: Diagnosis not present

## 2017-04-28 DIAGNOSIS — L821 Other seborrheic keratosis: Secondary | ICD-10-CM | POA: Diagnosis not present

## 2017-04-28 DIAGNOSIS — D1801 Hemangioma of skin and subcutaneous tissue: Secondary | ICD-10-CM | POA: Diagnosis not present

## 2017-05-02 ENCOUNTER — Encounter: Payer: Self-pay | Admitting: Internal Medicine

## 2017-05-02 NOTE — Progress Notes (Signed)
This very nice 82 y.o. MWM presents for 6 month follow up with Hypertension, Hyperlipidemia, T2_NIDDM and Vitamin D Deficiency. Patient is being followed by Dr Rexene Alberts for mild Dementia an atypical Parkinson's Dz and recently underwent neuropsych testing. Patient remains functional & independent in ADL's and driving with main issues of occasional difficulty with dysnomia and ST recall.  Patient also has hx/o recurrent kidney stones (>200+).      Patient is treated for HTN (1960's) & BP has been controlled at home. Today's BP is at goal - 136/74.  Patient had 2V CABG in 1996. Also, patient has dysautonomia with labile postural hypotension currently stable on Florinef.  Patient has had no complaints of any cardiac type chest pain, palpitations, dyspnea/orthopnea/PND, dizziness, claudication, or dependent edema.     Hyperlipidemia is controlled with diet & meds. Patient denies myalgias or other med SE's. Last Lipids were at goal:  Lab Results  Component Value Date   CHOL 138 10/12/2016   HDL 65 10/12/2016   LDLCALC 59 10/12/2016   TRIG 68 10/12/2016   CHOLHDL 2.1 10/12/2016      Also, the patient has history of T2_NIDDM (1995) w/ CKD3 (GFR 52).  He denies  symptoms of reactive hypoglycemia, diabetic polys, paresthesias or visual blurring.  Last A1c was at goal: Lab Results  Component Value Date   HGBA1C 5.0 10/12/2016      Further, the patient also has history of Vitamin D Deficiency ("18" in 2008) and supplements vitamin D without any suspected side-effects. Last vitamin D was at goal:   Lab Results  Component Value Date   VD25OH 72 10/12/2016   Current Outpatient Prescriptions on File Prior to Visit  Medication Sig  . ALPRAZolam (XANAX) 1 MG tablet TAKE ONE TABLET BY MOUTH THREE TIMES DAILY AS NEEDED FOR ANXIETY (Patient taking differently: Take 1mg  daily at night)  . aspirin 81 MG tablet Take 81 mg by mouth daily.    . Cholecalciferol (VITAMIN D3) 2000 UNITS capsule Take 8,000 Units  by mouth daily.   . clopidogrel (PLAVIX) 75 MG tablet TAKE ONE TABLET BY MOUTH ONCE DAILY  . fludrocortisone (FLORINEF) 0.1 MG tablet Take 1 tablet (0.1 mg total) by mouth 2 (two) times daily.  [name] glucose blood (FREESTYLE LITE) test strip Test glucose 1 x / day  . Lancets (FREESTYLE) lancets 1 each by Other route as needed for other. Use as instructed  . loperamide (IMODIUM) 2 MG capsule Take 4 mg by mouth as needed for diarrhea or loose stools.   . Melatonin 5 MG CAPS Take 10 mg by mouth at bedtime.   . nitroGLYCERIN (NITROSTAT) 0.4 MG SL tablet Sig: 1 tablet under tongue every 3 to 5 minutes as needed for Angina - Please Dispense # 2 bottles of # 25 tabs  . pantoprazole (PROTONIX) 40 MG tablet TAKE ONE TABLET BY MOUTH ONCE DAILY  . potassium citrate (UROCIT-K 10) 10 MEQ (1080 MG) SR tablet Take 10 mEq by mouth daily.  . prochlorperazine (COMPAZINE) 5 MG tablet TAKE 1-2 TABS THREE TIMES A DAY AS NEEDED FOR NAUSEA  . QUEtiapine (SEROQUEL) 25 MG tablet Take 1 tablet (25 mg total) by mouth 3 (three) times daily.  . simvastatin (ZOCOR) 80 MG tablet TAKE ONE TABLET BY MOUTH AT BEDTIME (Patient taking differently: Take 40mg s on Mondays, Wednesdays, and Fridays)   No current facility-administered medications on file prior to visit.    Allergies  Allergen Reactions  . Gabapentin Other (See Comments)  Severe confusion  . Prozac [Fluoxetine Hcl] Other (See Comments)    Patient state that it does not agree with him  . Soma [Carisoprodol] Other (See Comments)    Patient stated that it does not agree with him  . Beta Adrenergic Blockers Other (See Comments)    REACTION: Bradycardia   PMHx:   Past Medical History:  Diagnosis Date  . Anxiety   . Arthritis    "lower back; left ankle" (03/20/2014)  . ASTHMA   . Blood transfusion    "related to OHS, ulcers"  . CAD, ARTERY BYPASS GRAFT   . Colon polyps    adenomatous polyps  . COPD   . Diverticulosis   . GASTROESOPHAGEAL REFLUX DISEASE   .  HYPERLIPIDEMIA   . Insomnia   . Leukodystrophy   . Nephrolithiasis    "I've had over 320 kidney stones" (03/20/2014)  . Sleep apnea    "don't always wear it" (03/20/2014)  . SUPRAVENTRICULAR TACHYCARDIA, HX OF   . TIA   . Ulcer    Immunization History  Administered Date(s) Administered  . Influenza, High Dose Seasonal PF 08/13/2013, 08/05/2014, 07/23/2015, 06/22/2016  . Pneumococcal Conjugate-13 08/27/2014  . Pneumococcal Polysaccharide-23 08/06/2012  . Tdap 08/01/2013  . Zoster 05/02/2006   Past Surgical History:  Procedure Laterality Date  . ANKLE FRACTURE SURGERY Left ~ 2012  . CARDIAC CATHETERIZATION    . COLONOSCOPY    . CORONARY ARTERY BYPASS GRAFT  1996   CABG X2  . CYSTOSCOPY WITH URETEROSCOPY, STONE BASKETRY AND STENT PLACEMENT    . Cystourethroscopy with stent removal.    . EXCISIONAL HEMORRHOIDECTOMY    . GASTRECTOMY    . HIATAL HERNIA REPAIR    . IRRIGATION AND DEBRIDEMENT SEBACEOUS CYST     "off my back  . LEFT HEART CATH AND CORS/GRAFTS ANGIOGRAPHY N/A 12/23/2016   Procedure: Left Heart Cath and Cors/Grafts Angiography;  Surgeon: Burnell Blanks, MD;  Location: [facility] [facility];  Service: Cardiovascular;  Laterality: N/A;  . POLYPECTOMY    . PROSTATE SURGERY     "took the center of my prostate out"  . UPPER GASTROINTESTINAL ENDOSCOPY    . VAGOTOMY     FHx:    Reviewed / unchanged  SHx:    Reviewed / unchanged  Systems Review:  Constitutional: Denies fever, chills, wt changes, headaches, insomnia, fatigue, night sweats, change in appetite. Eyes: Denies redness, blurred vision, diplopia, discharge, itchy, watery eyes.  ENT: Denies discharge, congestion, post nasal drip, epistaxis, sore throat, earache, hearing loss, dental pain, tinnitus, vertigo, sinus pain, snoring.  CV: Denies chest pain, palpitations, irregular heartbeat, syncope, dyspnea, diaphoresis, orthopnea, PND, claudication or edema. Respiratory: denies cough, dyspnea, DOE, pleurisy,  hoarseness, laryngitis, wheezing.  Gastrointestinal: Denies dysphagia, odynophagia, heartburn, reflux, water brash, abdominal pain or cramps, nausea, vomiting, bloating, diarrhea, constipation, hematemesis, melena, hematochezia  or hemorrhoids. Genitourinary: Denies dysuria, frequency, urgency, nocturia, hesitancy, discharge, hematuria or flank pain. Musculoskeletal: Denies arthralgias, myalgias, stiffness, jt. swelling, pain, limping or strain/sprain.  Skin: Denies pruritus, rash, hives, warts, acne, eczema or change in skin lesion(s). Neuro: No weakness, tremor, incoordination, spasms, paresthesia or pain. Psychiatric: Denies confusion, memory loss or sensory loss. Endo: Denies change in weight, skin or hair change.  Heme/Lymph: No excessive bleeding, bruising or enlarged lymph nodes.  Physical Exam  BP 136/74   Pulse 64   Temp (!) 97.3 F (36.3 C)   Resp 16   Ht 5\' 11"  (1.803 m)   Wt 162 lb 3.2  oz (73.6 kg)   BMI 22.62 kg/m   Appears well nourished, well groomed  and in no distress.  Eyes: PERRLA, EOMs, conjunctiva no swelling or erythema. Sinuses: No frontal/maxillary tenderness ENT/Mouth: EAC's clear, TM's nl w/o erythema, bulging. Nares clear w/o erythema, swelling, exudates. Oropharynx clear without erythema or exudates. Oral hygiene is good. Tongue normal, non obstructing. Hearing intact.  Neck: Supple. Thyroid nl. Car 2+/2+ without bruits, nodes or JVD. Chest: Respirations nl with BS clear & equal w/o rales, rhonchi, wheezing or stridor.  Cor: Heart sounds normal w/ regular rate and rhythm without sig. murmurs, gallops, clicks or rubs. Peripheral pulses normal and equal  without edema.  Abdomen: Soft & bowel sounds normal. Non-tender w/o guarding, rebound, hernias, masses or organomegaly.  Lymphatics: Unremarkable.  Musculoskeletal: Full ROM all peripheral extremities, joint stability, 5/5 strength and normal gait.  Skin: Warm, dry without exposed rashes, lesions or  ecchymosis apparent.  Neuro: Cranial nerves intact, reflexes equal bilaterally. Sensory-motor testing grossly intact. Tendon reflexes grossly intact.  Pysch: Alert & oriented x 3.  Insight and judgement nl & appropriate. No ideations.  Assessment and Plan:  1. Autonomic postural hypotension  - Continue medication, monitor blood pressure at home.  - Continue DASH diet. Reminder to go to the ER if any CP,  SOB, nausea, dizziness, severe HA, changes vision/speech.  - CBC with Differential/Platelet - BASIC METABOLIC PANEL WITH GFR - Magnesium - TSH  2. Hyperlipidemia, mixed  - Continue diet/meds, exercise,& lifestyle modifications.  - Continue monitor periodic cholesterol/liver & renal functions   - Hepatic function panel - Lipid panel - TSH  3. Type 2 diabetes mellitus with stage 3 chronic kidney disease, without long-term current use of insulin (HCC)  - Continue diet, exercise, lifestyle modifications.  - Monitor appropriate labs.  - Hemoglobin A1c - Insulin, random  4. Vitamin D deficiency  - Continue supplementation.  - VITAMIN D 25 Hydroxy   5. 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 GERD   7. Medication management  - CBC with Differential/Platelet - BASIC METABOLIC PANEL WITH GFR - Hepatic function panel - Magnesium - Lipid panel - TSH - Hemoglobin A1c - Insulin, random - VITAMIN D 25 Hydroxy       Discussed  regular exercise, BP monitoring, weight control to achieve/maintain BMI less than 25 and discussed med and SE's. Recommended labs to assess and monitor clinical status with further disposition pending results of labs. Over 30 minutes of exam, counseling, chart review was performed.

## 2017-05-02 NOTE — Patient Instructions (Signed)

## 2017-05-03 ENCOUNTER — Ambulatory Visit (INDEPENDENT_AMBULATORY_CARE_PROVIDER_SITE_OTHER): Payer: PPO | Admitting: Internal Medicine

## 2017-05-03 ENCOUNTER — Encounter: Payer: Self-pay | Admitting: Internal Medicine

## 2017-05-03 VITALS — BP 136/74 | HR 64 | Temp 97.3°F | Resp 16 | Ht 71.0 in | Wt 162.2 lb

## 2017-05-03 DIAGNOSIS — Z79899 Other long term (current) drug therapy: Secondary | ICD-10-CM

## 2017-05-03 DIAGNOSIS — E1122 Type 2 diabetes mellitus with diabetic chronic kidney disease: Secondary | ICD-10-CM

## 2017-05-03 DIAGNOSIS — K2100 Gastro-esophageal reflux disease with esophagitis, without bleeding: Secondary | ICD-10-CM

## 2017-05-03 DIAGNOSIS — I951 Orthostatic hypotension: Secondary | ICD-10-CM

## 2017-05-03 DIAGNOSIS — I25810 Atherosclerosis of coronary artery bypass graft(s) without angina pectoris: Secondary | ICD-10-CM | POA: Diagnosis not present

## 2017-05-03 DIAGNOSIS — E782 Mixed hyperlipidemia: Secondary | ICD-10-CM | POA: Diagnosis not present

## 2017-05-03 DIAGNOSIS — N183 Chronic kidney disease, stage 3 (moderate): Secondary | ICD-10-CM

## 2017-05-03 DIAGNOSIS — E559 Vitamin D deficiency, unspecified: Secondary | ICD-10-CM

## 2017-05-03 LAB — CBC WITH DIFFERENTIAL/PLATELET
BASOS ABS: 45 {cells}/uL (ref 0–200)
Basophils Relative: 1 %
Eosinophils Absolute: 135 cells/uL (ref 15–500)
Eosinophils Relative: 3 %
HCT: 41.6 % (ref 38.5–50.0)
Hemoglobin: 13.9 g/dL (ref 13.2–17.1)
LYMPHS ABS: 1485 {cells}/uL (ref 850–3900)
Lymphocytes Relative: 33 %
MCH: 32 pg (ref 27.0–33.0)
MCHC: 33.4 g/dL (ref 32.0–36.0)
MCV: 95.9 fL (ref 80.0–100.0)
MONOS PCT: 7 %
MPV: 9.8 fL (ref 7.5–12.5)
Monocytes Absolute: 315 cells/uL (ref 200–950)
NEUTROS ABS: 2520 {cells}/uL (ref 1500–7800)
NEUTROS PCT: 56 %
PLATELETS: 194 10*3/uL (ref 140–400)
RBC: 4.34 MIL/uL (ref 4.20–5.80)
RDW: 13.9 % (ref 11.0–15.0)
WBC: 4.5 10*3/uL (ref 3.8–10.8)

## 2017-05-04 ENCOUNTER — Encounter: Payer: Self-pay | Admitting: *Deleted

## 2017-05-04 LAB — MAGNESIUM: MAGNESIUM: 2 mg/dL (ref 1.5–2.5)

## 2017-05-04 LAB — BASIC METABOLIC PANEL WITH GFR
BUN: 19 mg/dL (ref 7–25)
CALCIUM: 8.7 mg/dL (ref 8.6–10.3)
CO2: 25 mmol/L (ref 20–31)
CREATININE: 1.16 mg/dL — AB (ref 0.70–1.11)
Chloride: 108 mmol/L (ref 98–110)
GFR, EST NON AFRICAN AMERICAN: 59 mL/min — AB (ref >=60)
GFR, Est African American: 68 mL/min (ref >=60)
Glucose, Bld: 124 mg/dL — ABNORMAL HIGH (ref 65–99)
Potassium: 4.5 mmol/L (ref 3.5–5.3)
SODIUM: 143 mmol/L (ref 135–146)

## 2017-05-04 LAB — HEPATIC FUNCTION PANEL
ALT: 19 U/L (ref 9–46)
AST: 17 U/L (ref 10–35)
Albumin: 3.8 g/dL (ref 3.6–5.1)
Alkaline Phosphatase: 47 U/L (ref 40–115)
BILIRUBIN DIRECT: 0.1 mg/dL (ref <=0.2)
BILIRUBIN INDIRECT: 0.5 mg/dL (ref 0.2–1.2)
BILIRUBIN TOTAL: 0.6 mg/dL (ref 0.2–1.2)
Total Protein: 6.4 g/dL (ref 6.1–8.1)

## 2017-05-04 LAB — HEMOGLOBIN A1C
Hgb A1c MFr Bld: 5.1 % (ref <5.7)
Mean Plasma Glucose: 100 mg/dL

## 2017-05-04 LAB — LIPID PANEL
Cholesterol: 163 mg/dL (ref <200)
HDL: 64 mg/dL (ref >40)
LDL Cholesterol: 83 mg/dL (ref <100)
Total CHOL/HDL Ratio: 2.5 Ratio (ref <5.0)
Triglycerides: 81 mg/dL (ref <150)
VLDL: 16 mg/dL (ref <30)

## 2017-05-04 LAB — INSULIN, RANDOM: Insulin: 12.2 u[IU]/mL (ref 2.0–19.6)

## 2017-05-04 LAB — VITAMIN D 25 HYDROXY (VIT D DEFICIENCY, FRACTURES): VIT D 25 HYDROXY: 78 ng/mL (ref 30–100)

## 2017-05-04 LAB — TSH: TSH: 1.52 mIU/L (ref 0.40–4.50)

## 2017-05-12 ENCOUNTER — Encounter: Payer: PPO | Admitting: Psychology

## 2017-05-16 NOTE — Progress Notes (Signed)
NEUROPSYCHOLOGICAL EVALUATION   Name:    John [name]  Date of Birth:   Feb 28, 1935 Date of Interview:  04/18/2017 Date of Testing:  04/18/2017   Date of Feedback:  05/19/2017       Background Information:  Reason for Referral:  John [name] is a 82 y.o. right handed male referred by Dr. Star Age of [facility] to assess his current level of cognitive functioning and assist in differential diagnosis. The current evaluation consisted of a review of available medical records, an interview with the patient, and the completion of a neuropsychological testing battery. Informed consent was obtained.  History of Presenting Problem:  John [name] has been seeing Dr. Danae Orleans since 09/22/2015, after he was admitted to the hospital on 09/10/2015 for encephalopathy after a fall. He had a prior history of recurrent falls, as well as bilateral hand tremor for a few months, and he was started on amantadine in the hospital for parkinsonism. MMSE's have been stable over time with Dr. Rexene Alberts (scores of 28 and 29 over the past 1 1/2 years). MRI of the brain from 09/10/2015 reportedly revealed moderate atrophy and moderate chronic microvascular ischemic changes in the white matter and pons. Dr. 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 He also has a history of orthostatic hypotension and a history of sleep difficulty. He has been on Xanax 1 mg at night for a long time, and Seroquel was added for sleep, approximately 3 years ago. He notes that he has a CPAP but does not use it because when he shifts position, it loses its seal and air escapes.  At today's appointment, the patient complains primarily of word finding difficulty. The word usually comes back to him but sometimes not until hours later or the next day. The patient also states that sometimes he can see the word in his head but can't recall how to pronounce it. He has had trouble with  this for the last 3 years. 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 He had taught for 54 years. 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   Upon direct questioning, the patient also reported the following with regard to current cognitive functioning:   Forgetting recent conversations/events: Not conversations, sometimes events such as what he ate the night before Repeating statements/questions: Yes "my wife says I do" Misplacing/losing items: No  Difficulty concentrating: No Starting but not finishing tasks: No Processing information more slowly: Yes  Writing difficulty: "I have Parkinson's, my hand shakes, I have to print" - He notices sometimes that his handwriting is smaller but if he concentrates he can make it larger  Getting lost when driving: No Making wrong turns when driving: No Uncertain about directions when driving or passenger: No, and he can use his GPS if he is going somewhere new/unfamiliar  There is no family history of PD or dementia that he knows of; however, he does not know his mother's family history. He notes he was "given away" at age 74 to be raised by his paternal grandmother.  The patient has not had any falls recently but notes several in the past, which he feels were related to low blood sugar. He used to have episodes of severe hypoglycemia. He has hit his head several times in the context of  falls and has had to have stitches on his scalp. He states that one time, he passed out due to low blood sugar, and was unconscious for 20 minutes until EMS arrived. He was given glucose and came to. He ate protein and started feeling better. He chose not to go to the hospital. There was another occasion where he passed out, causing him to fall. He came to quickly, but was unable to get himself up. He was hospitalized after that  fall.  He has a history of delirium with hallucinations on two occasions. He reported that during one hospitalization he had a UTI and saw 3 men talking to him in his hospital room who were not really there. He also reported that during another hospitalization, he apparently thought that a nurse had called security and was going to have him leave, so he took out his IVs and was trying to leave.  Current Functioning: John [name] lives with his wife in their own home. He continues to manage all instrumental ADLs. He denies any difficulty with driving. He manages his medications with a weekly pill planner; he does miss doses occasionally. His daughter assists with management of the finances, and most of his bills are set on auto-pay. He has not missed any bill payments. He has not missed any appointments. He writes them on the calendar and his wife helps remind him. His wife has always done all the cooking. He continues to do yardwork without any difficulty.  Physically, he denies any major complaints. He is able to be active for several hours at a time in his yard, he feels his energy level is relatively good. He does not think his balance is good, but he has been being much more careful when he walks. He recently stopped amantadine and has not noticed any change in walking or physical functioning.   As noted previously, he has a history of sleep difficulties which persist. These are characterized by awakening a few hours after falling asleep and not being able to return to sleep. He is not able to take daytime naps. In the past few nights, however, he has achieved 5-6 hours of sleep which is good for him.  He feels his appetite is pretty good, but he does not eat nearly as much at one time as he used to. He notes that he used to not eat all day until dinner but he realized that was causing his blood sugar to drop so he tries to eat breakfast now.  He reports his mood is good. He does not think he  has any history of depression "but my family doctor seems to think I have at times". He does admit to a tendency to worry about things until they are solved. He denies any current stressors. He denies suicidal ideation or intention.   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 Anxiety   . Arthritis    "lower back; left ankle" (03/20/2014)  . ASTHMA   . Blood transfusion    "related to OHS, ulcers"  . CAD, ARTERY BYPASS GRAFT   . Colon polyps    adenomatous  polyps  . COPD   . Diverticulosis   . GASTROESOPHAGEAL REFLUX DISEASE   . HYPERLIPIDEMIA   . Insomnia   . Leukodystrophy   . Nephrolithiasis    "I've had over 320 kidney stones" (03/20/2014)  . Sleep apnea    "don't always wear it" (03/20/2014)  . SUPRAVENTRICULAR TACHYCARDIA, HX OF   . TIA   . Ulcer     Current medications:  Outpatient Encounter Prescriptions as of 05/19/2017  Medication Sig  . ALPRAZolam (XANAX) 1 MG tablet TAKE ONE TABLET BY MOUTH THREE TIMES DAILY AS NEEDED FOR ANXIETY (Patient taking differently: Take 1mg  daily at night)  . aspirin 81 MG tablet Take 81 mg by mouth daily.    . Cholecalciferol (VITAMIN D3) 2000 UNITS capsule Take 8,000 Units by mouth daily.   . clopidogrel (PLAVIX) 75 MG tablet TAKE ONE TABLET BY MOUTH ONCE DAILY  . fludrocortisone (FLORINEF) 0.1 MG tablet Take 1 tablet (0.1 mg total) by mouth 2 (two) times daily.  [name] glucose blood (FREESTYLE LITE) test strip Test glucose 1 x / day  . Lancets (FREESTYLE) lancets 1 each by Other route as needed for other. Use as instructed  . loperamide (IMODIUM) 2 MG  capsule Take 4 mg by mouth as needed for diarrhea or loose stools.   . Melatonin 5 MG CAPS Take 10 mg by mouth at bedtime.   . nitroGLYCERIN (NITROSTAT) 0.4 MG SL tablet Sig: 1 tablet under tongue every 3 to 5 minutes as needed for Angina - Please Dispense # 2 bottles of # 25 tabs  . pantoprazole (PROTONIX) 40 MG tablet TAKE ONE TABLET BY MOUTH ONCE DAILY  . potassium citrate (UROCIT-K 10) 10 MEQ (1080 MG) SR tablet Take 10 mEq by mouth daily.  . prochlorperazine (COMPAZINE) 5 MG tablet TAKE 1-2 TABS THREE TIMES A DAY AS NEEDED FOR NAUSEA  . QUEtiapine (SEROQUEL) 25 MG tablet Take 1 tablet (25 mg total) by mouth 3 (three) times daily.  . simvastatin (ZOCOR) 80 MG tablet TAKE ONE TABLET BY MOUTH AT BEDTIME (Patient taking differently: Take 40mg s on Mondays, Wednesdays, and Fridays)   No facility-administered encounter medications on file as of 05/19/2017.      Current Examination:  Behavioral Observations:   Appearance: Neatly and appropriately dressed and groomed Gait: Ambulated independently, no gross abnormalities observed Speech: Fluent; normal rate, rhythm and volume. No word finding difficulty observed during conversational speech. Thought process: Linear, goal directed Affect: Full, euthymic Interpersonal: Pleasant, appropriate Orientation: Oriented to all spheres. Accurately named the current President and his predecessor.   Tests Administered: . Test of Premorbid Functioning (TOPF) . Wechsler Adult Intelligence Scale-Fourth Edition (WAIS-IV): Similarities, Music therapist, Coding and Digit Span subtests . Wechsler Memory Scale-Fourth Edition (WMS-IV) Older Adult Version (ages 71-90): Logical Memory I, II and Recognition subtests  . [employer] Verbal Learning Test - 2nd Edition (CVLT-2) Short Form . Repeatable Battery for the Assessment of Neuropsychological Status (RBANS) Form A:  Figure Copy and Recall subtests and Semantic Fluency subtest . Neuropsychological Assessment Battery  (NAB) Language Module, Form 1: Naming subtest . Boston Diagnostic Aphasia Examination: Complex Ideational Material subtest . Controlled Oral Word Association Test (COWAT) . Trail Making Test A and B . Clock drawing test . Geriatric Depression Scale (GDS) 15 Item . Generalized Anxiety Disorder - 7 item screener (GAD-7)  Test Results: Note: Standardized scores are presented only for use by appropriately trained professionals and to allow for any future test-retest comparison. These scores should not be interpreted without  consideration of all the information that is contained in the rest of the report. The most recent standardization samples from the test publisher or other sources were used whenever possible to derive standard scores; scores were corrected for age, gender, ethnicity and education when available.   Test Scores:  Test Name Raw Score Standardized Score Descriptor  TOPF 20/70 SS= 84 Low average  WAIS-IV Subtests     Similarities 19/36 ss= 9 Average  Block Design 28/66 ss= 11 Average  Coding 30/135 ss= 8 Average  Digit Span Forward 11/16 ss= 12 High average  Digit Span Backward 6/16 ss= 8 Average  WMS-IV Subtests     LM I 19/53 ss= 7 Low average  LM II 6/39 ss= 7 Low average  LM II Recognition 17/23 Cum %: 26-50 WNL  RBANS Subtests     Figure Copy 17/20 Z= -0.2 Average  Figure Recall 12/20 Z= 0.2 Average   Semantic Fluency 12 Z= -1.5 Borderline  CVLT-II Scores     Trial 1 4/9 Z= -0.5 Average  Trial 4 5/9 Z= -1.5 Borderline  Trials 1-4 total 20/36 T= 50 Average  SD Free Recall 4/9 Z= -1 Low average  LD Free Recall 4/9 Z= 0 Average  LD Cued Recall 4/9 Z= 0 Average  Recognition Discriminability 9/9 hits, 0 false positives Z= 1.5 Superior  Forced Choice Recognition 9/9  WNL  NAB Naming 26/31 T= 35 Borderline  BDAE Complex Ideational Material 11/12  WNL  COWAT-FAS 23 T= 38 Low average  COWAT-Animals 15 T= 47 Average  Trail Making Test A  102"  0 errors T= 34  Borderline  Trail Making Test B  142" 0 errors T= 51 Average  Clock Drawing   WNL  GDS-15 4/15  WNL  GAD-7 6/21  Mild       Description of Test Results:  Premorbid verbal intellectual abilities were estimated to have been within the low average range based on a test of word reading. Psychomotor processing speed was variable, ranging from average on a coding task to borderline impaired on Trails A. Auditory attention and working memory were high average to average, respectively. Visual-spatial construction was average. Language abilities were variable. Specifically, confrontation naming was borderline impaired, and semantic verbal fluency ranged from borderline to average. Auditory comprehension of complex ideational material was within normal limits. With regard to verbal memory, encoding and acquisition of non-contextual information (i.e., word list) was average across four learning trials but with a relatively flat learning curve. After a brief distracter task, free recall was low average (4/9 items recalled). After a delay, free recall was average with 100% retention of previously encoded information (4/9 items recalled. He did not benefit from semantic cueing to aid any additional recall. Performance on a yes/no recognition task was superior with 100% accuracy. On another verbal memory test, encoding and acquisition of contextual auditory information (i.e., short stories) was low average. After a delay, free recall was low average. Performance on a yes/no recognition task was within normal limits. With regard to non-verbal memory, delayed free recall of visual information was average. Performances on tasks measuring various aspects of executive functioning were within normal limits. Mental flexibility and set-shifting were average on Trails B. Verbal fluency with phonemic search restrictions was low average. Verbal abstract reasoning was average. Performance on a clock drawing task was intact. On a  self-report measure of mood, the patient's responses were not indicative of clinically significant depression at the present time.  On a self-report measure of  anxiety, the patient did endorse mild generalized anxiety at the present time, characterized by excessive worrying and inability to control worrying.   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 Results of the current cognitive evaluation are largely within normal limits, with most areas of function consistent with estimated premorbid intellectual abilities. However, there are a few areas of mild impairment suggestive of mild fronto-subcortical dysfunction (e.g., reduced encoding abilities but good retrieval; reduced confrontation naming and mildly reduced verbal fluency). His testing results and current level of functioning both do not warrant a diagnosis of dementia; however, a diagnosis mild cognitive impairment is appropriate at this time. I do not suspect underlying Alzheimer's disease at this time but it cannot be completely ruled out. I do suspect untreated sleep apnea is a culprit, and there may be a vascular component as well given his vascular risk factors (CAD, hyperlipidemia) and chronic small vessel ischemic changes on neuroimaging. While he does appear to have at least a mild level of longstanding generalized anxiety disorder, I do not believe this would account for his cognitive deficits.   Recommendations/Plan: Based on the findings of the present evaluation, the following recommendations are offered:  1. Treatment of sleep apnea is recommended in order to improve sleep and enhance cognitive functioning. It is recommended that John [name] be referred to a sleep specialist in order to troubleshoot CPAP fitting.  2. Optimal control of vascular risk factors is also recommended in order to reduce the risk of vascular related cognitive decline. 3. He should continue to engage in  activities that provide safe cardiovascular exercise, mental stimulation and social interaction. 4. Anxiety level should be monitored. His generalized anxiety appears chronic and relatively mild but if it worsens, treatment should be considered. 5. Neuropsychological re-evaluation in one year is recommended in order to monitor cognitive functioning, track any progression of symptoms and further assist with treatment planning.   Feedback to Patient: John [name] returned for a feedback appointment on 05/19/2017 to review the results of his neuropsychological evaluation with this provider. 15 minutes face-to-face time was spent reviewing his test results, my impressions and my recommendations as detailed above.    Total time spent on this patient's case: [id number] unit for interview with psychologist; [phone] units of testing by psychometrician under psychologist's supervision; [phone] units for medical record review, scoring of neuropsychological tests, interpretation of test results, preparation of this report, and review of results to the patient by psychologist.      Thank you for your referral of [place]. Please feel free to contact me if you have any questions or concerns regarding this report.

## 2017-05-19 ENCOUNTER — Other Ambulatory Visit: Payer: Self-pay | Admitting: Internal Medicine

## 2017-05-19 ENCOUNTER — Ambulatory Visit (INDEPENDENT_AMBULATORY_CARE_PROVIDER_SITE_OTHER): Payer: PPO | Admitting: Psychology

## 2017-05-19 ENCOUNTER — Encounter: Payer: Self-pay | Admitting: Psychology

## 2017-05-19 DIAGNOSIS — G3184 Mild cognitive impairment, so stated: Secondary | ICD-10-CM

## 2017-05-19 DIAGNOSIS — R413 Other amnesia: Secondary | ICD-10-CM | POA: Diagnosis not present

## 2017-05-20 ENCOUNTER — Telehealth: Payer: Self-pay | Admitting: Neurology

## 2017-05-20 NOTE — Telephone Encounter (Signed)
Pt calling to inform that he was advised by Kandis Nab @ [facility] to contact Dr Rexene Alberts  To request another sleep apnea test, he is asking to be called

## 2017-05-20 NOTE — Telephone Encounter (Signed)
Please advise patient, that he has 2 options for his OSA:  He can see his sleep specialist back in FU.  If he has not had a sleep specialist for over 3 years and would like to consider CPAP again, he can schedule an appt with me for a face to face visit for OSA and I will order a sleep study after the visit.

## 2017-05-23 NOTE — Telephone Encounter (Signed)
I called pt to discuss. Pt's home number is in not in service right now. Will try again later.

## 2017-05-23 NOTE — Telephone Encounter (Signed)
I called pt. Pt reports that he has not seen his sleep specialist in over 5 years and does not remember who it was. He is agreeable for a face to face visit with Dr. Rexene Alberts tomorrow at 11:30am. Pt says that his cpap is "very old and has mold in it." I advised him that he does not need to bring it because of it's age. Pt verbalized understanding of appt date and time.

## 2017-05-24 ENCOUNTER — Encounter: Payer: Self-pay | Admitting: Neurology

## 2017-05-24 ENCOUNTER — Ambulatory Visit (INDEPENDENT_AMBULATORY_CARE_PROVIDER_SITE_OTHER): Payer: PPO | Admitting: Neurology

## 2017-05-24 VITALS — BP 145/73 | HR 66 | Ht 71.5 in | Wt 162.0 lb

## 2017-05-24 DIAGNOSIS — G4733 Obstructive sleep apnea (adult) (pediatric): Secondary | ICD-10-CM

## 2017-05-24 DIAGNOSIS — G479 Sleep disorder, unspecified: Secondary | ICD-10-CM | POA: Diagnosis not present

## 2017-05-24 DIAGNOSIS — G478 Other sleep disorders: Secondary | ICD-10-CM | POA: Diagnosis not present

## 2017-05-24 DIAGNOSIS — R413 Other amnesia: Secondary | ICD-10-CM

## 2017-05-24 NOTE — Progress Notes (Signed)
Subjective:    Patient ID: John [name] is a 82 y.o. male.  HPI     John Age, MD, PhD [facility] [address] [place], [place] [address]  John [name] is an 82 year old right-handed gentleman with an underlying medical history of hypertension, hyperlipidemia, remote history of smoking, current history of chewing tobacco, type 2 diabetes, vitamin D deficiency, coronary artery disease, status post CABG (two-vessel, 1996), reflux disease, depression, chronic kidney disease, history of TIA, asthma, COPD, history of kidney stones, history of SVT, arthritis and obstructive sleep apnea, who presents for evaluation of his prior diagnosis of OSA. He is unaccompanied today. I last saw him on 02/23/2017, at which time we talked about his tremors and memory loss. He had discontinued amantadine without repercussions or new symptoms. Weight was stable. He had no recent falls. His MMSE was 29 out of 30 at the time. 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 Results of the current cognitive evaluation are largely within normal limits, with most areas of function consistent with estimated premorbid intellectual abilities. However, there are a few areas of mild impairment suggestive of mild fronto-subcortical dysfunction (e.g., reduced encoding abilities but good retrieval; reduced confrontation naming and mildly reduced verbal fluency). His testing results and current level of functioning both do not warrant a diagnosis of dementia; however, a diagnosis mild cognitive impairment is appropriate at this time. I do not suspect underlying Alzheimer's disease at this time but it cannot be completely ruled out. I do suspect untreated sleep apnea is a culprit, and  there may be a vascular component as well given his vascular risk factors (CAD, hyperlipidemia) and chronic small vessel ischemic changes on neuroimaging. While he does appear to have at least a mild level of longstanding generalized anxiety disorder, I do not believe this would account for his cognitive deficits.     Recommendations/Plan: Based on the findings of the present evaluation, the following recommendations are offered:   1. Treatment of sleep apnea is recommended in order to improve sleep and enhance cognitive functioning. It is recommended that John [name] be referred to a sleep specialist in order to troubleshoot CPAP fitting.  2. Optimal control of vascular risk factors is also recommended in order to reduce the risk of vascular related cognitive decline. 3. He should continue to engage in activities that provide safe cardiovascular exercise, mental stimulation and social interaction. 4. Anxiety level should be monitored. His generalized anxiety appears chronic and relatively mild but if it worsens, treatment should be considered. 5. Neuropsychological re-evaluation in one year is recommended in order to monitor cognitive functioning, track any progression of symptoms and further assist with treatment planning. >>    Today, 05/24/2017 (all dictated new, as well as above notes, some dictation done in note pad or Word, outside of chart, may appear as copied):  He reports that he had a sleep study over 5 or 6 years ago. Prior test results are not available for my review today. He has a very old CPAP machine which he is not using. A CPAP compliance download his not available for my review today. He stopped using CPAP about 3 years ago, used it perhaps 2 or 3 years. He felt uncomfortable with it, he had a full facemask. He sleeps with the noise machine and watches  TV in bed. He has trouble going to sleep and currently takes melatonin 10 mg, Xanax 1 mg at night, Seroquel 25 mg at night. Despite  this, he may be awake for a couple of hours. He tries to go to bed around 10:30 when he takes his medications and may fall asleep around midnight, wakeup time at the latest will be 6:30 AM. He does not have night to night nocturia or restless leg symptoms. He does not typically sleep in the same bedroom as his wife. He denies morning headaches. His brother had sleep apnea and used BiPAP machine he recalls. For the past away at [name] 24. He would be willing to try CPAP therapy again.  The patient's allergies, current medications, family history, past medical history, past social history, past surgical history and problem list were reviewed and updated as appropriate.    Previously (copied from previous notes for reference):    I saw him on 08/26/2016, at which time he was still having sleep issues. He was going to sleep okay but not staying asleep as well. He had some short-term memory issues. He was taking melatonin at night, also Xanax 1 mg and Seroquel 25 mg at night, was no longer on Serax. He had no recent falls, he was trying to be more careful. He was on amantadine 100 mg once daily per PCP. I suggested he stay better hydrated with water, I asked him to get an eye check, he was also advised to try to stop the amantadine and see how it goes.   I saw him on 03/04/2016, at which time his exam was fairly stable. I suggested we monitor his memory and continue his amantadine at the current dose. Today, 03/04/2016: He had some memory issues. No driving issues, no disorientation, no hallucinations, but some word finding and enunciation problems, still trouble with sleep, going to sleep and staying asleep. He was on Seroquel 25 mg qHS and 1 mg of Xanax. No recent falls, has had some interim stressor with wife's medical issues.    I saw him on 11/17/2015, at which time he reported no new symptoms and no new complaints. He was taking amantadine 100 mg twice daily, he was trying to hydrate well. He had no recent  falls. He felt his memory was not as good since his hospital stay in November 2016 but had no other specific new memory related complaints. He lives with his wife. His exam was fairly stable. We mutually agreed to maintain him on amantadine 100 mg twice daily, exam was not in keeping with idiopathic Parkinson's disease.   I first met him on 09/22/2015 after a fall and a recent hospitalization for this. I kept him on amantadine 100 mg twice daily at the time.   09/22/2015: He was recently admitted to the hospital on 09/10/2015 secondary to encephalopathy. He was discharged on 09/11/2015 and I reviewed the hospital records including the discharge summary. He was admitted from the emergency room after a fall at home. His wife was unable to help him after his fall. He rolled out of bed. He had recurrent falls at home prior to that. CT head without contrast in the emergency room on 09/10/2015 showed: Stable examination with stable periventricular white matter disease and hypodensities in the basal ganglia. No acute posttraumatic findings.   Postadmission he had a brain MRI without contrast on 09/10/2015: Atrophy and chronic microvascular ischemia.  No acute abnormality. In addition, I personally reviewed the images through the PACS system.  I agree with the findings.   During his hospitalization, neurology was consulted and suggested he start amantadine 100 mg twice daily for parkinsonism.    He was discharged to home with home health physical therapy which he is currently enrolled in.     I reviewed your office note from 09/04/2015.   He has had a tremor in both hands for a few months, some memory loss. His daughter, John [name], reports that he has had some instances of missing a turn recently. He is currently not driving but would like to go back to driving. He has had a history of orthostatic hypotension with improvement after he was started on Florinef several months ago. He has no family history of  Parkinson's disease. He feels that the amantadine has helped. He denies any side effects. He has a history of problems sleeping at night and has been on Xanax at night for quite some time. About a year ago he was started on Seroquel at night. He has no history of dream enactments.  His Past Medical History Is Significant For: Past Medical History:  Diagnosis Date  . Anxiety   . Arthritis    "lower back; left ankle" (03/20/2014)  . ASTHMA   . Blood transfusion    "related to OHS, ulcers"  . CAD, ARTERY BYPASS GRAFT   . Colon polyps    adenomatous polyps  . COPD   . Diverticulosis   . GASTROESOPHAGEAL REFLUX DISEASE   . HYPERLIPIDEMIA   . Insomnia   . Leukodystrophy   . Nephrolithiasis    "I've had over 320 kidney stones" (03/20/2014)  . Sleep apnea    "don't always wear it" (03/20/2014)  . SUPRAVENTRICULAR TACHYCARDIA, HX OF   . TIA   . Ulcer     His Past Surgical History Is Significant For: Past Surgical History:  Procedure Laterality Date  . ANKLE FRACTURE SURGERY Left ~ 2012  . CARDIAC CATHETERIZATION    . COLONOSCOPY    . CORONARY ARTERY BYPASS GRAFT  1996   CABG X2  . CYSTOSCOPY WITH URETEROSCOPY, STONE BASKETRY AND STENT PLACEMENT    . Cystourethroscopy with stent removal.    . EXCISIONAL HEMORRHOIDECTOMY    . GASTRECTOMY    . HIATAL HERNIA REPAIR    . IRRIGATION AND DEBRIDEMENT SEBACEOUS CYST     "off my back  . LEFT HEART CATH AND CORS/GRAFTS ANGIOGRAPHY N/A 12/23/2016   Procedure: Left Heart Cath and Cors/Grafts Angiography;  Surgeon: Burnell Blanks, MD;  Location: [facility];  Service: Cardiovascular;  Laterality: N/A;  . POLYPECTOMY    . PROSTATE SURGERY     "took the center of my prostate out"  . UPPER GASTROINTESTINAL ENDOSCOPY    . VAGOTOMY      His Family History Is Significant For: Family History  Problem Relation [name] of Onset  . Alcohol abuse Father   . Stroke Mother   . Heart disease Brother   . Diabetes Brother   . Heart disease  Paternal Grandmother   . Colon cancer Neg Hx   . Stomach cancer Neg Hx     His Social History Is Significant For: Social History   Social History  . Marital status: Married    Spouse name: N/A  . Number of children: 2  . Years of education: 14   Occupational History  . 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 Smoking status: Former Smoker    Packs/day: 0.50  Types: Cigarettes    Quit date: 10/25/1962  . Smokeless tobacco: Current User    Types: Chew  . Alcohol use No  . Drug use: No  . Sexual activity: Yes   Other Topics Concern  . None   Social History Narrative   Denies caffeine use     His Allergies Are:  Allergies  Allergen Reactions  . Gabapentin Other (See Comments)    Severe confusion  . Prozac [Fluoxetine Hcl] Other (See Comments)    Patient state that it does not agree with him  . Soma [Carisoprodol] Other (See Comments)    Patient stated that it does not agree with him  . Beta Adrenergic Blockers Other (See Comments)    REACTION: Bradycardia  :   His Current Medications Are:  Outpatient Encounter Prescriptions as of 05/24/2017  Medication Sig  . ALPRAZolam (XANAX) 1 MG tablet TAKE ONE TABLET BY MOUTH THREE TIMES DAILY AS NEEDED FOR ANXIETY (Patient taking differently: Take '1mg'$  daily at night)  . aspirin 81 MG tablet Take 81 mg by mouth daily.    . Cholecalciferol (VITAMIN D3) 2000 UNITS capsule Take 8,000 Units by mouth daily.   . clopidogrel (PLAVIX) 75 MG tablet TAKE ONE TABLET BY MOUTH ONCE DAILY  . fludrocortisone (FLORINEF) 0.1 MG tablet Take 1 tablet (0.1 mg total) by mouth 2 (two) times daily.  [name] glucose blood (FREESTYLE LITE) test strip Test glucose 1 x / day  . Lancets (FREESTYLE) lancets 1 each by Other route as needed for other. Use as instructed  . loperamide (IMODIUM) 2 MG capsule Take 4 mg by mouth as needed for diarrhea or loose stools.   . Melatonin 5 MG CAPS Take 10 mg by mouth at bedtime.   .  nitroGLYCERIN (NITROSTAT) 0.4 MG SL tablet Sig: 1 tablet under tongue every 3 to 5 minutes as needed for Angina - Please Dispense # 2 bottles of # 25 tabs  . pantoprazole (PROTONIX) 40 MG tablet TAKE ONE TABLET BY MOUTH ONCE DAILY  . potassium citrate (UROCIT-K 10) 10 MEQ (1080 MG) SR tablet Take 10 mEq by mouth daily.  . prochlorperazine (COMPAZINE) 5 MG tablet TAKE 1-2 TABS THREE TIMES A DAY AS NEEDED FOR NAUSEA  . QUEtiapine (SEROQUEL) 25 MG tablet Take 1 tablet (25 mg total) by mouth 3 (three) times daily.  . simvastatin (ZOCOR) 80 MG tablet TAKE ONE TABLET BY MOUTH AT BEDTIME (Patient taking differently: Take '40mg'$ s on Mondays, Wednesdays, and Fridays)   No facility-administered encounter medications on file as of 05/24/2017.   :  Review of Systems:  Out of a complete 14 point review of systems, all are reviewed and negative with the exception of these symptoms as listed below: Review of Systems  Neurological:       Pt presents today to discuss getting a new sleep and study and a new cpap. Pt has had a cpap in the past but it is quite old. Pt does endorse snoring.  Epworth Sleepiness Scale 0= would never doze 1= slight chance of dozing 2= moderate chance of dozing 3= high chance of dozing  Sitting and reading: 1 Watching TV: 1 Sitting inactive in a public place (ex. Theater or meeting): 0 As a passenger in a car for an hour without a break: 0 Lying down to rest in the afternoon: 1 Sitting and talking to someone: 0 Sitting quietly after lunch (no alcohol): 0 In a car, while stopped in traffic: 0 Total: 3  Objective:  Neurological Exam  Physical Exam Physical Examination:   Vitals:   05/24/17 1142  BP: (!) 145/73  Pulse: 66   General Examination: The patient is a very pleasant 82 y.o. male in no acute distress. He appears well-developed and well-nourished and well groomed.   HEENT:Normocephalic, atraumatic, pupils are equal, round and reactive to light and  accommodation. He is status post bilateral cataract repairs. He has eyeglasses but is not wearing them. Extraocular tracking is fairly good today, mild saccadic breakdown. He has no change in his stable limitation to upper gaze. He is no significant facial masking or decrease in eye blink rate. Neck is perhaps mildly rigid. He has mild mouth dryness, otherwise stable findings, tongue and palate are central and symmetrical, respectively. Airway examination reveals a neck circumference of 17 and three-quarter inches. He has a mild to moderate airway crowding secondary to larger uvula, Mallampati class II, redundant soft palate and tonsils of about 1+.  Chest:is clear to auscultation without wheezing, rhonchi or crackles noted.  Heart:sounds are regular and normal without murmurs, rubs or gallops noted.   Abdomen:is soft, non-tender and non-distended with normal bowel sounds appreciated on auscultation.  Extremities:There is no pitting edema in the distal lower extremities bilaterally. Pedal pulses are intact.   Skin: is warm and dry with no trophic changes noted.   Musculoskeletal: exam reveals no obvious joint deformities, tenderness, joint swelling or erythema.  Neurologically:  Mental status: The patient is awake and alert, paying good attention. He is able to give a fairly good history. His memory, attention, language and knowledge are very mildly impaired. There is no aphasia, agnosia, apraxia or anomia. There is a mild degree of bradyphrenia. Speech is mildly hypophonic with no dysarthria noted. Mood is congruent and affect is normal.   On 03/04/2016: MMSE 28/30, CDT: 4/4, AFT: 15/min.  On 08/26/2016: MMSE: 28/30, CDT: 3/4, AFT: 14/min.   On 02/23/2017: MMSE: 29/30, CDT: 4/4, AFT: 13/min.   Cranial nerves are as described above under HEENT exam. In addition, shoulder shrug is normal with equal shoulder height noted.  Motor exam: Normal bulk, and strength for [name] is noted.  There are no dyskinesias noted. Reflexes are 1+ in the UEs, absent in both ankles, trace in the knees. Tone is normal to mildly rigid globally. He has no significant cogwheeling, no resting tremor, no postural or action tremor, fine motor skills mildly impaired globally, no lateralization. He stands up without significant difficulty, does push himself up but posture is stable, [name]-appropriate. He walks slowly and cautiously, no one-sided limp or veering to one side.  Assessment and Plan:   In summary, AVEION NGUYEN is a very pleasant 82 year old male with an underlying medical history of hypertension, hyperlipidemia,very remote history of smoking, current history of chewing tobacco,type 2 diabetes, vitamin D deficiency, coronary artery disease, status post CABG(two-vessel, 1996), reflux disease, depression, chronic kidney disease, history of TIA, asthma, COPD, history of kidney stones, history of SVT, arthritis and obstructive sleep apnea, who presents for reevaluation of his prior diagnosis of OSA. He has a history of gait disorder and memory complaints. He recently had neuropsychological testing and was recommended to pursue sleep apnea testing again. He has tried CPAP some years ago and would be willing to try again. He has a family history of OSA. Physical exam is stable, no telltale signs of Parkinson's disease or changes in his overall mild appearing parkinsonism, questionable at this time. Of note, he was hospitalized in November 2016  after several falls but he has been cautious. We talked about sleep apnea, its treatment options and prognosis and implications as far as his overall cardiovascular risk and also risk for dementia if OSA remains untreated. I will request an overnight polysomnogram and we will proceed from there. I answered all his questions today and he was in agreement with the plan.  Of note, he has vascular risk factors, denies a family history of Alzheimer's disease. We will  monitor his memory. We talked about healthy lifestyle, staying active mentally and physically and good nutrition good hydration again today, as well as fall risk. Notably, he is taking several potentially sedating medications. He had a head CT and brain MRI in Nov 2016, which showed moderate atrophy and moderate white matter changes including hypodensities in both basal ganglia. He is at risk for vascular parkinsonism and vascular dementia.  I spent 20 minutes in total face-to-face time with the patient, more than 50% of which was spent in counseling and coordination of care, reviewing test results, reviewing medication and discussing or reviewing the diagnosis of OSA, its prognosis and treatment options. Pertinent laboratory and imaging test results that were available during this visit with the patient were reviewed by me and considered in my medical decision making (see chart for details).

## 2017-05-24 NOTE — Patient Instructions (Signed)

## 2017-05-26 ENCOUNTER — Other Ambulatory Visit: Payer: Self-pay | Admitting: Internal Medicine

## 2017-06-22 ENCOUNTER — Ambulatory Visit (INDEPENDENT_AMBULATORY_CARE_PROVIDER_SITE_OTHER): Payer: PPO | Admitting: Neurology

## 2017-06-22 DIAGNOSIS — R0683 Snoring: Secondary | ICD-10-CM

## 2017-06-22 DIAGNOSIS — G4720 Circadian rhythm sleep disorder, unspecified type: Secondary | ICD-10-CM

## 2017-06-22 DIAGNOSIS — G4733 Obstructive sleep apnea (adult) (pediatric): Secondary | ICD-10-CM | POA: Diagnosis not present

## 2017-06-22 DIAGNOSIS — R9431 Abnormal electrocardiogram [ECG] [EKG]: Secondary | ICD-10-CM

## 2017-06-23 ENCOUNTER — Telehealth: Payer: Self-pay

## 2017-06-23 NOTE — Telephone Encounter (Signed)
Pt returned my call. I advised him that his sleep study did not show any significant osa but that he did have snoring. Pt did have near-absence of supine sleep and absence of REM sleep during this study which may cause the sleep study results to underestimate his sleep disordered breathing. I advised him that the study showed some irregularities in his EKG and Dr. Rexene Alberts recommends clinical correlation and cardiology consultation. Pt says that he has not seen cardiology in over one year but will follow up again. I asked pt to keep his appt as scheduled with Dr. Rexene Alberts in November. Pt verbalized understanding of results. Pt had no questions at this time but was encouraged to call back if questions arise.

## 2017-06-23 NOTE — Procedures (Signed)
PATIENT'S NAME:  John [name], John [name] DOB:      05/09/1935      MR#:    [id number]     DATE OF RECORDING: 06/22/2017 REFERRING M.D.:  Unk Pinto, MD Study Performed:   Baseline Polysomnogram HISTORY: 82 year old man with a history of hypertension, hyperlipidemia, remote history of smoking, current history of chewing tobacco, type 2 diabetes, vitamin D deficiency, coronary artery disease, status post CABG,   reflux disease, depression, chronic kidney disease, history of TIA, asthma, COPD, history of kidney stones, history of SVT, arthritis and obstructive sleep apnea, who presents for evaluation of his prior diagnosis of OSA. The patient endorsed the Epworth Sleepiness Scale at 3 points. The patient's weight 161 pounds with a height of 71 (inches), resulting in a BMI of 22.5 kg/m2. The patient's neck circumference measured 17 inches.  CURRENT MEDICATIONS: Xanax, Aspirin, Plavix, Nitrostat, Melatonin, Protonix, Compazine, Seroquel, Zocor   PROCEDURE:  This is a multichannel digital polysomnogram utilizing the Somnostar 11.2 system.  Electrodes and sensors were applied and monitored per AASM Specifications.   EEG, EOG, Chin and Limb EMG, were sampled at 200 Hz.  ECG, Snore and Nasal Pressure, Thermal Airflow, Respiratory Effort, CPAP Flow and Pressure, Oximetry was sampled at 50 Hz. Digital video and audio were recorded.      BASELINE STUDY  Lights Out was at 22:56 and Lights On at 05:00.  Total recording time (TRT) was 364 minutes, with a total sleep time (TST) of  199.5 minutes.   The patient's sleep latency was 56 minutes, which is delayed. REM sleep was absent. The sleep efficiency was 54.8%, which is markedly reduced.    SLEEP ARCHITECTURE: WASO (Wake after sleep onset) was 91 minutes with moderate sleep fragmentation noted and several longer periods of wakefulness. There were 41.5 minutes in Stage N1, 132.5 minutes Stage N2, 25.5 minutes Stage N3 and 0 minutes in Stage REM.  The percentage of  Stage N1 was 20.8%, which is markedly increased, Stage N2 was 66.4%, which is increased, Stage N3 was 12.8% and Stage R (REM sleep) was absent. The arousals were noted as: 15 were spontaneous, 0 were associated with PLMs, 4 were associated with respiratory events.    Audio and video analysis did not show any abnormal or unusual movements, behaviors, phonations or vocalizations. The patient took no bathroom breaks. Mild to moderate snoring was noted. The EKG showed some irregularities.   RESPIRATORY ANALYSIS:  There were a total of 7 respiratory events:  1 obstructive apneas, 0 central apneas and 0 mixed apneas with a total of 1 apneas and an apnea index (AI) of .3 /hour. There were 6 hypopneas with a hypopnea index of 1.8 /hour. The patient also had 1 respiratory event related arousals (RERAs).      The total APNEA/HYPOPNEA INDEX (AHI) was 2.1/hour and the total RESPIRATORY DISTURBANCE INDEX was 2.4 /hour.  0 events occurred in REM sleep and 12 events in NREM. The REM AHI was 0 /hour, versus a non-REM AHI of 2.1. The patient spent 12 minutes of total sleep time in the supine position and 188 minutes in non-supine.. The supine AHI was 0.0 versus a non-supine AHI of 2.2.  OXYGEN SATURATION & C02:  The Wake baseline 02 saturation was 95%, with the lowest being 89%. Time spent below 89% saturation equaled 0 minutes.  PERIODIC LIMB MOVEMENTS: The patient had a total of 0 Periodic Limb Movements.  The Periodic Limb Movement (PLM) index was 0 and the PLM Arousal index was 0/hour.  Post-study, the patient indicated that sleep was better than usual.   IMPRESSION:  1. Primary Snoring 2. Nonspecific abnormal EKG 3. Dysfunctions associated with sleep stages or arousal from sleep  RECOMMENDATIONS:  1. This study does not demonstrate any significant obstructive or central sleep disordered breathing. Please note, however, that the near-absence of supine sleep and absence of REM sleep during this study may  underestimate his sleep disordered breathing.  2. The study showed some irregularities in his single lead EKG; clinical correlation is recommended and consultation with cardiology may be feasible.  3. This study shows sleep fragmentation and abnormal sleep stage percentages; these are nonspecific findings and per se do not signify an intrinsic sleep disorder or a cause for the patient's sleep-related symptoms. Causes include (but are not limited to) the first night effect of the sleep study, circadian rhythm disturbances, medication effect or an underlying mood disorder or medical problem.  4. The patient should be cautioned not to drive, work at heights, or operate dangerous or heavy equipment when tired or sleepy. Review and reiteration of good sleep hygiene measures should be pursued with any patient. 5. The patient will be seen in follow-up by Dr. Rexene Alberts at [facility] for discussion of the test results and further management strategies. The referring provider will be notified of the test results.  I certify that I have reviewed the entire raw data recording prior to the issuance of this report in accordance with the Standards of Accreditation of the American Academy of Sleep Medicine (AASM)   Star Age, MD, PhD Diplomat, American Board of Psychiatry and Neurology (Neurology and Sleep Medicine)

## 2017-06-23 NOTE — Progress Notes (Signed)
Patient last seen by me on 05/24/17, diagnostic PSG on 06/22/17.   Please call and notify the patient that the recent sleep study did not show any significant obstructive sleep apnea. He had snoring. Please note, however, that the near-absence of supine sleep and absence of REM sleep during this study may underestimate his sleep disordered breathing.  The study showed some irregularities in his single lead EKG; clinical correlation is recommended and consultation with cardiology may be feasible. He follows with cardiology on a regular basis.  Please inform patient that we can keep the follow up appointment, as scheduled in Nov.   Once you have spoken to patient, you can close this encounter.   Thanks,  Star Age, MD, PhD [facility])

## 2017-06-23 NOTE — Telephone Encounter (Signed)
I called pt to discuss his sleep study results. No answer, left a message asking him to call me back. 

## 2017-06-23 NOTE — Telephone Encounter (Signed)
-----   Message from Star Age, MD sent at 06/23/2017  8:11 AM EDT ----- Patient last seen by me on 05/24/17, diagnostic PSG on 06/22/17.   Please call and notify the patient that the recent sleep study did not show any significant obstructive sleep apnea. He had snoring. Please note, however, that the near-absence of supine sleep and absence of REM sleep during this study may underestimate his sleep disordered breathing.  The study showed some irregularities in his single lead EKG; clinical correlation is recommended and consultation with cardiology may be feasible. He follows with cardiology on a regular basis.  Please inform patient that we can keep the follow up appointment, as scheduled in Nov.   Once you have spoken to patient, you can close this encounter.   Thanks,  Star Age, MD, PhD [facility])

## 2017-06-24 NOTE — Telephone Encounter (Signed)
Please update pt in AM on Tue re: EKG on sleep study: Most likely the changes were in keeping with occasional extrabeats, which are called PACs, but I did not see anything more serious like afib ("irregular heartbeat"). Unfortunately, the EKG during a sleep study gives an idea, but not more specific information about the nature of the heart function or the EKG as it is not a full fledged 12 lead EKG. Hope, this helps.

## 2017-06-24 NOTE — Telephone Encounter (Addendum)
Pt called he is wanting to know what irregularities were seen in the EKG during sleep. Pt has appt with cardiologist on Tuesday at 11:30 . I advised him Cyril Mourning nor Dr Rexene Alberts was here today and the clinic closes at noon. I told him Dr Rexene Alberts note read there were some "irregularities in his single lead EKG". He wrote this down and said he will talk with cardiologist about it on Tuesday. He was appreciative.   FYI

## 2017-06-26 ENCOUNTER — Encounter: Payer: Self-pay | Admitting: Physician Assistant
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
## 2017-06-28 ENCOUNTER — Encounter: Payer: Self-pay | Admitting: Physician Assistant

## 2017-06-28 ENCOUNTER — Ambulatory Visit (INDEPENDENT_AMBULATORY_CARE_PROVIDER_SITE_OTHER): Payer: PPO | Admitting: Physician Assistant

## 2017-06-28 VITALS — BP 138/74 | HR 76 | Ht 71.5 in | Wt 166.0 lb

## 2017-06-28 DIAGNOSIS — I499 Cardiac arrhythmia, unspecified: Secondary | ICD-10-CM

## 2017-06-28 DIAGNOSIS — I2510 Atherosclerotic heart disease of native coronary artery without angina pectoris: Secondary | ICD-10-CM

## 2017-06-28 DIAGNOSIS — G4733 Obstructive sleep apnea (adult) (pediatric): Secondary | ICD-10-CM | POA: Diagnosis not present

## 2017-06-28 DIAGNOSIS — I951 Orthostatic hypotension: Secondary | ICD-10-CM | POA: Diagnosis not present

## 2017-06-28 NOTE — Patient Instructions (Addendum)
Medication Instructions:  Your physician recommends that you continue on your current medications as directed. Please refer to the Current Medication list given to you today.  Labwork: None ordered  Testing/Procedures: Your physician has recommended that you wear an event monitor. Event monitors are medical devices that record the heart's electrical activity. Doctors most often [place] these monitors to diagnose arrhythmias. Arrhythmias are problems with the speed or rhythm of the heartbeat. The monitor is a small, portable device. You can wear one while you do your normal daily activities. This is usually used to diagnose what is causing palpitations/syncope (passing out).    Follow-Up: Your physician recommends that you schedule a follow-up appointment in: 2 MONTHS WITH DAYNA DUNN, PA-C   Any Other Special Instructions Will Be Listed Below (If Applicable).    Cardiac Event Monitoring A cardiac event monitor is a small recording device that is used to detect abnormal heart rhythms (arrhythmias). The monitor is used to record your heart rhythm when you have symptoms, such as:  Fast heartbeats (palpitations), such as heart racing or fluttering.  Dizziness.  Fainting or light-headedness.  Unexplained weakness.  Some monitors are wired to electrodes placed on your chest. Electrodes are flat, sticky disks that attach to your skin. Other monitors may be hand-held or worn on the wrist. The monitor can be worn for up to 30 days. If the monitor is attached to your chest, a technician will prepare your chest for the electrode placement and show you how to work the monitor. Take time to practice using the monitor before you leave the office. Make sure you understand how to send the information from the monitor to your health care provider. In some cases, you may need to use a landline telephone instead of a cell phone. What are the risks? Generally, this device is safe to use, but it possible that  the skin under the electrodes will become irritated. How to use your cardiac event monitor  Wear your monitor at all times, except when you are in water: ? Do not let the monitor get wet. ? Take the monitor off when you bathe. Do not swim or use a hot tub with it on.  Keep your skin clean. Do not put body lotion or moisturizer on your chest.  Change the electrodes as told by your health care provider or any time they stop sticking to your skin. You may need to use medical tape to keep them on.  Try to put the electrodes in slightly different places on your chest to help prevent skin irritation. They must remain in the area under your left breast and in the upper right section of your chest.  Make sure the monitor is safely clipped to your clothing or in a location close to your body that your health care provider recommends.  Press the button to record as soon as you feel heart-related symptoms, such as: ? Dizziness. ? Weakness. ? Light-headedness. ? Palpitations. ? Thumping or pounding in your chest. ? Shortness of breath. ? Unexplained weakness.  Keep a diary of your activities, such as walking, doing chores, and taking medicine. It is very important to note what you were doing when you pushed the button to record your symptoms. This will help your health care provider determine what might be contributing to your symptoms.  Send the recorded information as recommended by your health care provider. It may take some time for your health care provider to process the results.  Change the batteries  as told by your health care provider.  Keep electronic devices away from your monitor. This includes: ? Tablets. ? MP3 players. ? Cell phones.  While wearing your monitor you should avoid: ? Electric blankets. ? Armed forces operational officer. ? Electric toothbrushes. ? Microwave ovens. ? Magnets. ? Metal detectors. Get help right away if:  You have chest pain.  You have extreme difficulty  breathing or shortness of breath.  You develop a very fast heartbeat that persists.  You develop dizziness that does not go away.  You faint or constantly feel like you are about to faint. Summary  A cardiac event monitor is a small recording device that is used to help detect abnormal heart rhythms (arrhythmias).  The monitor is used to record your heart rhythm when you have heart-related symptoms.  Make sure you understand how to send the information from the monitor to your health care provider.  It is important to press the button on the monitor when you have any heart-related symptoms.  Keep a diary of your activities, such as walking, doing chores, and taking medicine. It is very important to note what you were doing when you pushed the button to record your symptoms. This will help your health care provider learn what might be causing your symptoms. This information is not intended to replace advice given to you by your health care provider. Make sure you discuss any questions you have with your health care provider. Document Released: 07/20/2008 Document Revised: 09/25/2016 Document Reviewed: 09/25/2016 Elsevier Interactive Patient Education  2017 [employer].  If you need a refill on your cardiac medications before your next appointment, please call your pharmacy.

## 2017-06-28 NOTE — Telephone Encounter (Signed)
I called pt and explained Dr. Guadelupe Sabin findings. Pt says that he saw cardiology this morning and he is to wear a heart monitor. Pt does not have any further concerns and will follow up as scheduled with Dr. Rexene Alberts in November.

## 2017-07-11 ENCOUNTER — Other Ambulatory Visit: Payer: Self-pay | Admitting: Physician Assistant

## 2017-07-11 DIAGNOSIS — I499 Cardiac arrhythmia, unspecified: Secondary | ICD-10-CM

## 2017-07-11 DIAGNOSIS — I491 Atrial premature depolarization: Secondary | ICD-10-CM

## 2017-07-12 ENCOUNTER — Ambulatory Visit (INDEPENDENT_AMBULATORY_CARE_PROVIDER_SITE_OTHER): Payer: PPO

## 2017-07-12 DIAGNOSIS — I499 Cardiac arrhythmia, unspecified: Secondary | ICD-10-CM

## 2017-07-12 DIAGNOSIS — I491 Atrial premature depolarization: Secondary | ICD-10-CM | POA: Diagnosis not present

## 2017-07-18 ENCOUNTER — Ambulatory Visit (INDEPENDENT_AMBULATORY_CARE_PROVIDER_SITE_OTHER): Payer: PPO

## 2017-07-18 DIAGNOSIS — Z23 Encounter for immunization: Secondary | ICD-10-CM | POA: Diagnosis not present

## 2017-07-19 ENCOUNTER — Ambulatory Visit: Payer: Self-pay

## 2017-07-29 ENCOUNTER — Other Ambulatory Visit: Payer: Self-pay | Admitting: Internal Medicine

## 2017-08-04 ENCOUNTER — Ambulatory Visit: Payer: Self-pay | Admitting: Physician Assistant

## 2017-08-07 ENCOUNTER — Encounter: Payer: Self-pay | Admitting: Adult Health

## 2017-08-07 NOTE — Progress Notes (Deleted)
FOLLOW UP  Assessment and Plan:    Hypertension  Currently monitoring without treatment secondary to concerns about dysautonomia with labile hypotension  Monitor blood pressure at home.  Continue DASH diet.    Reminder to go to the ER if any CP, SOB, nausea, dizziness, severe HA, changes vision/speech, left arm numbness and tingling and jaw pain.  Cholesterol  Continue medications: simvastatin 80 mg  Continue diet and exercise.   Check lipid panel.    Prediabetes  Currently well managed by lifestyle  Continue diet and exercise.   Check A1C  Vitamin D Def  Continue medications: cholecalciferol 8000 IU  Check Vit D level  CKD stage III  BMP with GFR  GERD  Well managed on current medications: protonix 40 mg  Discussed diet, avoiding triggers and other lifestyle changes   Continue diet and meds as discussed. Further disposition pending results of labs. Discussed med's effects and SE's.   Over 30 minutes of exam, counseling, chart review, and critical decision making was performed.   Future Appointments Date Time Provider [place]  08/08/2017 2:30 PM Liane Comber, NP [facility] None  08/30/2017 11:30 AM Star Age, MD [facility] None  09/09/2017 2:20 PM Burnell Blanks, MD [facility]  11/21/2017 11:00 AM Unk Pinto, MD [facility] None    ----------------------------------------------------------------------------------------------------------------------  HPI 82 y.o. male  presents for 3 month follow up on hypertension with recent labile hypotension, cholesterol, prediabetes, GERD and vitamin D deficiency.   Seroquel/compazine??  GERD on PPI- hx ulcer  His blood pressure {HAS HAS NOT:18834} been controlled at home, today their BP is    He {DOES_DOES QAS:34196} workout. He denies chest pain, shortness of breath, dizziness.   He is on cholesterol medication and denies myalgias. His cholesterol is at goal. The  cholesterol last visit was:   Lab Results  Component Value Date   CHOL 163 05/03/2017   HDL 64 05/03/2017   LDLCALC 83 05/03/2017   TRIG 81 05/03/2017   CHOLHDL 2.5 05/03/2017    He {Has/has not:18111} been working on diet and exercise for prediabetes, and denies {Symptoms; diabetes w/o none:19199}. Last A1C in the office was:  Lab Results  Component Value Date   HGBA1C 5.1 05/03/2017   Patient is on Vitamin D supplement and at goal.   Lab Results  Component Value Date   VD25OH 78 05/03/2017       Lab Results  Component Value Date   GFRNONAA 59 (L) 05/03/2017          Current Medications:  Current Outpatient Prescriptions on File Prior to Visit  Medication Sig  . ALPRAZolam (XANAX) 1 MG tablet TAKE ONE TABLET BY MOUTH THREE TIMES DAILY AS NEEDED FOR ANXIETY  . aspirin 81 MG tablet Take 81 mg by mouth daily.    . Cholecalciferol (VITAMIN D3) 2000 UNITS capsule Take 8,000 Units by mouth daily.   . clopidogrel (PLAVIX) 75 MG tablet TAKE ONE TABLET BY MOUTH ONCE DAILY  . fludrocortisone (FLORINEF) 0.1 MG tablet Take 1 tablet (0.1 mg total) by mouth 2 (two) times daily.  [name] glucose blood (FREESTYLE LITE) test strip Test glucose 1 x / day  . Lancets (FREESTYLE) lancets 1 each by Other route as needed for other. Use as instructed  . loperamide (IMODIUM) 2 MG capsule Take 4 mg by mouth as needed for diarrhea or loose stools.   . Melatonin 5 MG CAPS Take 10 mg by mouth at bedtime.   . nitroGLYCERIN (NITROSTAT) 0.4 MG SL  tablet Sig: 1 tablet under tongue every 3 to 5 minutes as needed for Angina - Please Dispense # 2 bottles of # 25 tabs  . pantoprazole (PROTONIX) 40 MG tablet TAKE ONE TABLET BY MOUTH ONCE DAILY  . potassium citrate (UROCIT-K 10) 10 MEQ (1080 MG) SR tablet Take 10 mEq by mouth daily.  . prochlorperazine (COMPAZINE) 5 MG tablet TAKE 1-2 TABS THREE TIMES A DAY AS NEEDED FOR NAUSEA  . QUEtiapine (SEROQUEL) 25 MG tablet Take 1 tablet (25 mg total) by mouth 3 (three)  times daily.  . simvastatin (ZOCOR) 80 MG tablet Take 80 mg by mouth daily. Patient takes 40 mg by mouth on Monday, Wednesday, Friday.   No current facility-administered medications on file prior to visit.      Allergies:  Allergies  Allergen Reactions  . Gabapentin Other (See Comments)    Severe confusion  . Prozac [Fluoxetine Hcl] Other (See Comments)    Patient state that it does not agree with him  . Soma [Carisoprodol] Other (See Comments)    Patient stated that it does not agree with him  . Beta Adrenergic Blockers Other (See Comments)    REACTION: Bradycardia     Medical History:  Past Medical History:  Diagnosis Date  . Anxiety   . Arthritis    "lower back; left ankle" (03/20/2014)  . ASTHMA   . Blood transfusion    "related to OHS, ulcers"  . CAD (coronary artery disease)    a. s/p 2V CABG 1996. b. Last cath 12/2016 -> patent grafts, med rx.  . CAD, ARTERY BYPASS GRAFT   . CKD (chronic kidney disease), stage II   . Colon polyps    adenomatous polyps  . COPD   . Diverticulosis   . GASTROESOPHAGEAL REFLUX DISEASE   . HYPERLIPIDEMIA   . Insomnia   . Leukodystrophy   . Nephrolithiasis    "I've had over 320 kidney stones" (03/20/2014)  . Parkinsonism ([place])   . Sleep apnea    "don't always wear it" (03/20/2014)  . SUPRAVENTRICULAR TACHYCARDIA, HX OF   . TIA   . Ulcer    Family history- Reviewed and unchanged Social history- Reviewed and unchanged   Review of Systems:  ROS    Physical Exam: There were no vitals taken for this visit. Wt Readings from Last 3 Encounters:  06/28/17 166 lb (75.3 kg)  05/24/17 162 lb (73.5 kg)  05/03/17 162 lb 3.2 oz (73.6 kg)   General Appearance: Well nourished, in no apparent distress. Eyes: PERRLA, EOMs, conjunctiva no swelling or erythema Sinuses: No Frontal/maxillary tenderness ENT/Mouth: Ext aud canals clear, TMs without erythema, bulging. No erythema, swelling, or exudate on post pharynx.  Tonsils not swollen or  erythematous. Hearing normal.  Neck: Supple, thyroid normal.  Respiratory: Respiratory effort normal, BS equal bilaterally without rales, rhonchi, wheezing or stridor.  Cardio: RRR with no MRGs. Brisk peripheral pulses without edema.  Abdomen: Soft, + BS.  Non tender, no guarding, rebound, hernias, masses. Lymphatics: Non tender without lymphadenopathy.  Musculoskeletal: Full ROM, 5/5 strength, {PSY - GAIT AND STATION:22860} gait Skin: Warm, dry without rashes, lesions, ecchymosis.  Neuro: Cranial nerves intact. No cerebellar symptoms.  Psych: Awake and oriented X 3, normal affect, Insight and Judgment appropriate.    Izora Ribas, NP 2:28 PM [facility]

## 2017-08-08 ENCOUNTER — Ambulatory Visit: Payer: Self-pay | Admitting: Adult Health

## 2017-08-18 ENCOUNTER — Ambulatory Visit (INDEPENDENT_AMBULATORY_CARE_PROVIDER_SITE_OTHER): Payer: PPO | Admitting: Physician Assistant

## 2017-08-18 ENCOUNTER — Encounter: Payer: Self-pay | Admitting: Physician Assistant

## 2017-08-18 VITALS — BP 126/84 | HR 73 | Temp 97.3°F | Resp 16 | Ht 71.5 in | Wt 159.8 lb

## 2017-08-18 DIAGNOSIS — I10 Essential (primary) hypertension: Secondary | ICD-10-CM | POA: Diagnosis not present

## 2017-08-18 DIAGNOSIS — E782 Mixed hyperlipidemia: Secondary | ICD-10-CM

## 2017-08-18 DIAGNOSIS — I471 Supraventricular tachycardia: Secondary | ICD-10-CM

## 2017-08-18 DIAGNOSIS — Z79899 Other long term (current) drug therapy: Secondary | ICD-10-CM

## 2017-08-18 DIAGNOSIS — J449 Chronic obstructive pulmonary disease, unspecified: Secondary | ICD-10-CM | POA: Diagnosis not present

## 2017-08-18 LAB — CBC WITH DIFFERENTIAL/PLATELET
BASOS PCT: 0.9 %
Basophils Absolute: 42 cells/uL (ref 0–200)
EOS ABS: 99 {cells}/uL (ref 15–500)
EOS PCT: 2.1 %
HCT: 45.9 % (ref 38.5–50.0)
HEMOGLOBIN: 15.9 g/dL (ref 13.2–17.1)
Lymphs Abs: 1880 cells/uL (ref 850–3900)
MCH: 32.3 pg (ref 27.0–33.0)
MCHC: 34.6 g/dL (ref 32.0–36.0)
MCV: 93.3 fL (ref 80.0–100.0)
MONOS PCT: 8.8 %
MPV: 10.2 fL (ref 7.5–12.5)
NEUTROS ABS: 2265 {cells}/uL (ref 1500–7800)
Neutrophils Relative %: 48.2 %
PLATELETS: 194 10*3/uL (ref 140–400)
RBC: 4.92 10*6/uL (ref 4.20–5.80)
RDW: 12.8 % (ref 11.0–15.0)
Total Lymphocyte: 40 %
WBC mixed population: 414 cells/uL (ref 200–950)
WBC: 4.7 10*3/uL (ref 3.8–10.8)

## 2017-08-18 LAB — MAGNESIUM: Magnesium: 1.9 mg/dL (ref 1.5–2.5)

## 2017-08-18 LAB — HEPATIC FUNCTION PANEL
AG RATIO: 1.6 (calc) (ref 1.0–2.5)
ALBUMIN MSPROF: 4.2 g/dL (ref 3.6–5.1)
ALKALINE PHOSPHATASE (APISO): 65 U/L (ref 40–115)
ALT: 33 U/L (ref 9–46)
AST: 25 U/L (ref 10–35)
BILIRUBIN TOTAL: 0.8 mg/dL (ref 0.2–1.2)
Bilirubin, Direct: 0.2 mg/dL (ref 0.0–0.2)
GLOBULIN: 2.7 g/dL (ref 1.9–3.7)
Indirect Bilirubin: 0.6 mg/dL (calc) (ref 0.2–1.2)
TOTAL PROTEIN: 6.9 g/dL (ref 6.1–8.1)

## 2017-08-18 LAB — LIPID PANEL
CHOL/HDL RATIO: 2.8 (calc) (ref <5.0)
CHOLESTEROL: 152 mg/dL (ref <200)
HDL: 55 mg/dL (ref >40)
LDL Cholesterol (Calc): 76 mg/dL (calc)
Non-HDL Cholesterol (Calc): 97 mg/dL (calc) (ref <130)
TRIGLYCERIDES: 124 mg/dL (ref <150)

## 2017-08-18 LAB — BASIC METABOLIC PANEL WITH GFR
BUN/Creatinine Ratio: 15 (calc) (ref 6–22)
BUN: 22 mg/dL (ref 7–25)
CALCIUM: 10.1 mg/dL (ref 8.6–10.3)
CO2: 28 mmol/L (ref 20–32)
CREATININE: 1.45 mg/dL — AB (ref 0.70–1.11)
Chloride: 106 mmol/L (ref 98–110)
GFR, EST NON AFRICAN AMERICAN: 45 mL/min/{1.73_m2} — AB (ref >=60)
GFR, Est African American: 52 mL/min/{1.73_m2} — ABNORMAL LOW (ref >=60)
GLUCOSE: 115 mg/dL — AB (ref 65–99)
Potassium: 5.2 mmol/L (ref 3.5–5.3)
Sodium: 141 mmol/L (ref 135–146)

## 2017-08-18 LAB — TSH: TSH: 3.03 mIU/L (ref 0.40–4.50)

## 2017-08-18 NOTE — Progress Notes (Signed)
Patient ID: John [name], male   DOB: 10-Jan-1935, 82 y.o.   MRN: [id number]  Assessment and Plan:  Hypertension:  -Continue medication,  -monitor blood pressure at home.  -Continue DASH diet.   -Reminder to go to the ER if any CP, SOB, nausea, dizziness, severe HA, changes vision/speech, left arm numbness and tingling, and jaw pain.  Cholesterol: -Continue diet and exercise.  -Check cholesterol.   preDM  -Continue diet and exercise.   Vitamin D Def: -check level -continue medications.   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   Chronic obstructive pulmonary disease, unspecified COPD type ([place]) Controlled Monitor for symptoms, avoid trigrers   Continue diet and meds as discussed. Further disposition pending results of labs.  HPI 82 y.o. male  presents for 3 month follow up with hypertension, hyperlipidemia, prediabetes and vitamin D.   His blood pressure has been controlled at home, today their BP is BP: 126/84.   He does workout. He denies chest pain, shortness of breath, dizziness.  He reports that this is his normal blood pressure.  He reports that he is not walking as often.   HE had heart cath 12/2016, showed moderately severe stenosis mid LAD with patent LIMA to LAD and Diagonal, antegrade flow down the LAD and competitive flow from the graft, mild disease in RCA, normal EF, normal LVEDP - med rx recommended and normal holter monitor 06/2017 for 30 days.   He is on cholesterol medication and denies myalgias. His cholesterol is at goal. The cholesterol last visit was:   Lab Results  Component Value Date   CHOL 163 05/03/2017   HDL 64 05/03/2017   LDLCALC 83 05/03/2017   TRIG 81 05/03/2017   CHOLHDL 2.5 05/03/2017    He has been working on diet and exercise for prediabetes, and denies foot ulcerations, hyperglycemia,  hypoglycemia , increased appetite, nausea, paresthesia of the feet, polydipsia, polyuria, visual disturbances, vomiting and weight loss. Last A1C in the office was:  Lab Results  Component Value Date   HGBA1C 5.1 05/03/2017   Patient is on Vitamin D supplement.  Lab Results  Component Value Date   VD25OH 78 05/03/2017     BMI is Body mass index is 21.98 kg/m., he is working on diet and exercise. Wt Readings from Last 3 Encounters:  08/18/17 159 lb 12.8 oz (72.5 kg)  06/28/17 166 lb (75.3 kg)  05/24/17 162 lb (73.5 kg)    Current Medications:  Current Outpatient Prescriptions on File Prior to Visit  Medication Sig Dispense Refill  . ALPRAZolam (XANAX) 1 MG tablet TAKE ONE TABLET BY MOUTH THREE TIMES DAILY AS NEEDED FOR ANXIETY 270 tablet 0  . aspirin 81 MG tablet Take 81 mg by mouth daily.      . Cholecalciferol (VITAMIN D3) 2000 UNITS capsule Take 8,000 Units by mouth daily.     . clopidogrel (PLAVIX) 75 MG tablet TAKE ONE TABLET BY MOUTH ONCE DAILY 90 tablet 1  . fludrocortisone (FLORINEF) 0.1 MG tablet Take 1 tablet (0.1 mg total) by mouth 2 (two) times daily. 180 tablet 1  . glucose blood (FREESTYLE LITE) test strip Test glucose 1 x / day 90 each 1  . Lancets (FREESTYLE) lancets 1 each by Other route as needed for other. Use as instructed    . loperamide (IMODIUM) 2 MG capsule Take 4 mg by mouth as  needed for diarrhea or loose stools.     . Melatonin 5 MG CAPS Take 10 mg by mouth at bedtime.     . nitroGLYCERIN (NITROSTAT) 0.4 MG SL tablet Sig: 1 tablet under tongue every 3 to 5 minutes as needed for Angina - Please Dispense # 2 bottles of # 25 tabs 50 tablet 99  . pantoprazole (PROTONIX) 40 MG tablet TAKE ONE TABLET BY MOUTH ONCE DAILY 90 tablet 3  . potassium citrate (UROCIT-K 10) 10 MEQ (1080 MG) SR tablet Take 10 mEq by mouth daily.    . prochlorperazine (COMPAZINE) 5 MG tablet TAKE 1-2 TABS THREE TIMES A DAY AS NEEDED FOR NAUSEA 50 tablet 0  . QUEtiapine (SEROQUEL) 25 MG  tablet Take 1 tablet (25 mg total) by mouth 3 (three) times daily. 270 tablet 0  . simvastatin (ZOCOR) 80 MG tablet Take 80 mg by mouth daily. Patient takes 40 mg by mouth on Monday, Wednesday, Friday.     No current facility-administered medications on file prior to visit.     Medical History:  Past Medical History:  Diagnosis Date  . Anxiety   . Arthritis    "lower back; left ankle" (03/20/2014)  . ASTHMA   . Blood transfusion    "related to OHS, ulcers"  . CAD (coronary artery disease)    a. s/p 2V CABG 1996. b. Last cath 12/2016 -> patent grafts, med rx.  . CAD, ARTERY BYPASS GRAFT   . CKD (chronic kidney disease), stage II   . Colon polyps    adenomatous polyps  . COPD   . Diverticulosis   . Duodenal ulcer without hemorrhage or perforation 12/29/2011  . GASTROESOPHAGEAL REFLUX DISEASE   . HYPERLIPIDEMIA   . Insomnia   . Leukodystrophy   . Nephrolithiasis    "I've had over 320 kidney stones" (03/20/2014)  . Parkinsonism ([place])   . Sleep apnea    "don't always wear it" (03/20/2014)  . SUPRAVENTRICULAR TACHYCARDIA, HX OF   . TIA   . Ulcer     Allergies:  Allergies  Allergen Reactions  . Gabapentin Other (See Comments)    Severe confusion  . Prozac [Fluoxetine Hcl] Other (See Comments)    Patient state that it does not agree with him  . Soma [Carisoprodol] Other (See Comments)    Patient stated that it does not agree with him  . Beta Adrenergic Blockers Other (See Comments)    REACTION: Bradycardia     Review of Systems:  Review of Systems  Constitutional: Negative for chills, diaphoresis, fever, malaise/fatigue and weight loss.  HENT: Negative for congestion, ear pain and sore throat.   Eyes: Negative for blurred vision.  Respiratory: Negative for cough, shortness of breath and wheezing.   Cardiovascular: Negative for chest pain, palpitations and leg swelling.  Gastrointestinal: Negative for blood in stool, constipation, diarrhea, heartburn and melena.   Genitourinary: Negative.   Skin: Negative.   Neurological: Negative for dizziness, loss of consciousness, weakness and headaches.  Psychiatric/Behavioral: Negative for depression. The patient is not nervous/anxious and does not have insomnia.     Family history- Review and unchanged  Social history- Review and unchanged  Physical Exam: BP 126/84   Pulse 73   Temp (!) 97.3 F (36.3 C)   Resp 16   Ht 5' 11.5" (1.816 m)   Wt 159 lb 12.8 oz (72.5 kg)   SpO2 96%   BMI 21.98 kg/m  Wt Readings from Last 3 Encounters:  08/18/17 159 lb 12.8  oz (72.5 kg)  06/28/17 166 lb (75.3 kg)  05/24/17 162 lb (73.5 kg)    General Appearance: Well nourished well developed, in no apparent distress. Eyes: PERRLA, EOMs, conjunctiva no swelling or erythema ENT/Mouth: Ear canals normal without obstruction, swelling, erythma, discharge.  TMs normal bilaterally.  Oropharynx moist, clear, without exudate, or postoropharyngeal swelling. Neck: Supple, thyroid normal,no cervical adenopathy  Respiratory: Respiratory effort normal, Breath sounds clear A&P without rhonchi, wheeze, or rale.  No retractions, no accessory usage. Cardio: RRR with no MRGs. Brisk peripheral pulses without edema.  Abdomen: Soft, + BS,  Non tender, no guarding, rebound, hernias, masses. Musculoskeletal: Full ROM, 5/5 strength, Normal gait Skin: Warm, dry without rashes, lesions, ecchymosis.  Neuro: Awake and oriented X 3, Cranial nerves intact. Normal muscle tone, no cerebellar symptoms. Psych: Normal affect, Insight and Judgment appropriate.    Vicie Mutters, PA-C 12:04 PM [facility]

## 2017-08-18 NOTE — Patient Instructions (Signed)
Premature Ventricular Contraction A premature ventricular contraction (PVC) is a common irregularity in the normal heart rhythm. These contractions are extra heartbeats that start in the heart ventricles and occur too early in the normal sequence. During the PVC, the heart's normal electrical pathway is not used, so the beat is shorter and less effective. In most cases, these contractions come and go and do not require treatment. What are the causes? In many cases, the cause may not be known. Common causes of the condition include:  Smoking.  Drinking alcohol.  Caffeine.  Certain medicines.  Some illegal drugs.  Stress.   What are the signs or symptoms? The main symptom of this condition is a fast or skipped heartbeat (palpitations). Other symptoms include:  Chest pain.  Shortness of breath.  Feeling tired.  Dizziness.  In some cases, there are no symptoms. How is this diagnosed? This condition may be diagnosed based on:  Your medical history.  A physical exam. During the exam, the health care provider will check for irregular heartbeats.  Tests, such as: ? An ECG (electrocardiogram) to monitor the electrical activity of your heart. ? Holter monitor testing. This involves wearing a device that clips to your clothing and monitors the electrical activity of your heart over longer periods of time. ? Stress tests to see how exercise affects your heart rhythm and blood supply. ? Echocardiogram. This test uses sound waves (ultrasound) to produce an image of your heart. ? Electrophysiology study. This test checks the electric pathways in your heart.  How is this treated? Treatment depends on any underlying conditions, the type of PVCs that you are having, and how much the symptoms are interfering with your daily life. Possible treatments include:  Avoiding things that can trigger the premature contractions, such as caffeine or alcohol.  Medicines. These may be given if  symptoms are severe or if the extra heartbeats are frequent.   In some cases, no treatment is required. Follow these instructions at home: Lifestyle Follow these instructions as told by your health care provider:  Do not use any products that contain nicotine or tobacco, such as cigarettes and e-cigarettes. If you need help quitting, ask your health care provider.  If caffeine triggers episodes of PVC, do not eat, drink, or use anything with caffeine in it.  If caffeine does not seem to trigger episodes, consume caffeine in moderation.  If alcohol triggers episodes of PVC, do not drink alcohol.  If alcohol does not seem to trigger episodes, limit alcohol intake to no more than 1 drink a day for nonpregnant women and 2 drinks a day for men. One drink equals 12 oz of beer, 5 oz of wine, or 1 oz of hard liquor.  Exercise regularly. Ask your health care provider what type of exercise is safe for you.  Find healthy ways to manage stress. Avoid stressful situations when possible.  Try to get at least 7-9 hours of sleep each night, or as much as recommended by your health care provider.  Do not use illegal drugs.  General instructions  Take over-the-counter and prescription medicines only as told by your health care provider.  Keep all follow-up visits as told by your health care provider. This is important. Get help right away if:  You feel palpitations that are frequent or continual.  You have chest pain.  You have shortness of breath.  You have sweating for no reason.  You have nausea and vomiting.  You become light-headed or you faint.  This information is not intended to replace advice given to you by your health care provider. Make sure you discuss any questions you have with your health care provider. Document Released: 05/28/2004 Document Revised: 06/04/2016 Document Reviewed: 03/17/2016 Elsevier Interactive Patient Education  [employer].

## 2017-08-22 NOTE — Progress Notes (Signed)
Pt aware of lab results & voiced understanding of those results.

## 2017-08-23 ENCOUNTER — Other Ambulatory Visit: Payer: Self-pay | Admitting: Internal Medicine

## 2017-08-23 DIAGNOSIS — I2510 Atherosclerotic heart disease of native coronary artery without angina pectoris: Secondary | ICD-10-CM

## 2017-08-29 ENCOUNTER — Ambulatory Visit: Payer: PPO | Admitting: Neurology

## 2017-08-30 ENCOUNTER — Encounter: Payer: Self-pay | Admitting: Neurology

## 2017-08-30 ENCOUNTER — Ambulatory Visit (INDEPENDENT_AMBULATORY_CARE_PROVIDER_SITE_OTHER): Payer: PPO | Admitting: Neurology

## 2017-08-30 VITALS — BP 130/69 | HR 60 | Ht 71.5 in | Wt 164.0 lb

## 2017-08-30 DIAGNOSIS — G479 Sleep disorder, unspecified: Secondary | ICD-10-CM | POA: Diagnosis not present

## 2017-08-30 DIAGNOSIS — R413 Other amnesia: Secondary | ICD-10-CM

## 2017-08-30 NOTE — Patient Instructions (Addendum)
Your sleep study was somewhat limited; you did not sleep well, just a little over 50% of the time we tested. Also, you did not achieve any REM/dream sleep. All in all, no significant sleep apnea was detected.  You have otherwise remained stable with your memory over time.  We can continue to monitor the memory.  Please continue to eat well and drink water. Exercise within your limitations.

## 2017-08-30 NOTE — Progress Notes (Signed)
Subjective:    Patient ID: John [name] is a 82 y.o. male.  HPI     Interim history:   John [name] is an 82 year old right-handed gentleman with an underlying medical history of hypertension, hyperlipidemia, remote history of smoking, current history of chewing tobacco, type 2 diabetes, vitamin D deficiency, coronary artery disease, status post CABG (two-vessel, 1996), reflux disease, depression, chronic kidney disease, history of TIA, asthma, COPD, history of kidney stones, history of SVT, arthritis and obstructive sleep apnea, who presents for follow-up consultation of his memory loss. The patient is unaccompanied today. I last saw him on 05/24/2017, at which time we discussed his cognitive evaluation results which indicated mild cognitive impairment. He also reported a prior diagnosis of OSA. He was not sleeping very well. He was using the noise machine. He was taking melatonin and also Xanax as well as Seroquel. I suggested we proceed with sleep study testing. He had a baseline sleep study on 06/22/2017. I went over his test results with him in detail today. Sleep efficiency was 54.8%. Sleep latency was 56 minutes. REM sleep was absent. He had an increased percentage of stage I sleep, increased percentage of stage II sleep, slow-wave sleep was 12.8% and REM sleep was absent. Total AHI was 2.1 per hour. Average oxygen saturation was 95%, nadir was 89%. He had no significant PLMS. He had some irregularities in his EKG. When we called him with his test results I suggested cardiac consultation.  Today, 08/30/2017 (all dictated new, as well as above notes, some dictation done in note pad or Word, outside of chart, may appear as copied):   He reports feeling stable, no acute illness, no recent medication changes. Sleep is a little better lately. Memory is about the same, feels it has become a little worse over time. No recent falls but has fallen in the past. He feels off-balance especially when he  first stands up, especially with turns. He had a heart monitor in the interim which showed PVCs and PACs. He has a routine follow-up with his cardiologist.   The patient's allergies, current medications, family history, past medical history, past social history, past surgical history and problem list were reviewed and updated as appropriate.    Previously (copied from previous notes for reference):   I saw him on 02/23/2017, at which time we talked about his tremors and memory loss. He had discontinued amantadine without repercussions or new symptoms. Weight was stable. He had no recent falls. His MMSE was 29 out of 30 at the time. He had interim cognitive evaluation on 04/18/2017 and a follow-up appointment for discussion on 05/19/2017, I reviewed the report for Dr. 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 Results of the current cognitive evaluation are largely within normal limits, with most areas of function consistent with estimated premorbid intellectual abilities. However, there are a few areas of mild impairment suggestive of mild fronto-subcortical dysfunction (e.g., reduced encoding abilities but good retrieval; reduced confrontation naming and mildly reduced verbal fluency). His testing results and current level of functioning both do not warrant a diagnosis of dementia; however, a diagnosis mild cognitive impairment is appropriate at this time. I do not suspect underlying Alzheimer's disease at this time but it cannot be completely ruled out. I do suspect untreated sleep apnea is a culprit, and there may be a vascular component as well given his vascular risk factors (CAD, hyperlipidemia) and chronic small vessel ischemic changes on neuroimaging.  While he does appear to have at least a mild level of longstanding generalized anxiety disorder, I do not believe this would account for his cognitive deficits.      Recommendations/Plan: Based on the findings of the present evaluation, the following recommendations are offered:   1. Treatment of sleep apnea is recommended in order to improve sleep and enhance cognitive functioning. It is recommended that Mr. Mukai be referred to a sleep specialist in order to troubleshoot CPAP fitting.  2. Optimal control of vascular risk factors is also recommended in order to reduce the risk of vascular related cognitive decline. 3. He should continue to engage in activities that provide safe cardiovascular exercise, mental stimulation and social interaction. 4. Anxiety level should be monitored. His generalized anxiety appears chronic and relatively mild but if it worsens, treatment should be considered. 5. Neuropsychological re-evaluation in one year is recommended in order to monitor cognitive functioning, track any progression of symptoms and further assist with treatment planning. >>      I saw him on 08/26/2016, at which time he was still having sleep issues. He was going to sleep okay but not staying asleep as well. He had some short-term memory issues. He was taking melatonin at night, also Xanax 1 mg and Seroquel 25 mg at night, was no longer on Serax. He had no recent falls, he was trying to be more careful. He was on amantadine 100 mg once daily per PCP. I suggested he stay better hydrated with water, I asked him to get an eye check, he was also advised to try to stop the amantadine and see how it goes.   I saw him on 03/04/2016, at which time his exam was fairly stable. I suggested we monitor his memory and continue his amantadine at the current dose. Today, 03/04/2016: He had some memory issues. No driving issues, no disorientation, no hallucinations, but some word finding and enunciation problems, still trouble with sleep, going to sleep and staying asleep. He was on Seroquel 25 mg qHS and 1 mg of Xanax. No recent falls, has had some interim stressor with  wife's medical issues.    I saw him on 11/17/2015, at which time he reported no new symptoms and no new complaints. He was taking amantadine 100 mg twice daily, he was trying to hydrate well. He had no recent falls. He felt his memory was not as good since his hospital stay in November 2016 but had no other specific new memory related complaints. He lives with his wife. His exam was fairly stable. We mutually agreed to maintain him on amantadine 100 mg twice daily, exam was not in keeping with idiopathic Parkinson's disease.   I first met him on 09/22/2015 after a fall and a recent hospitalization for this. I kept him on amantadine 100 mg twice daily at the time.   09/22/2015: He was recently admitted to the hospital on 09/10/2015 secondary to encephalopathy. He was discharged on 09/11/2015 and I reviewed the hospital records including the discharge summary. He was admitted from the emergency room after a fall at home. His wife was unable to help him after his fall. He rolled out of bed. He had recurrent falls at home prior to that. CT head without contrast in the emergency room on 09/10/2015 showed: Stable examination with stable periventricular white matter disease and hypodensities in the basal ganglia. No acute posttraumatic findings.   Postadmission he had a brain MRI without contrast on 09/10/2015: Atrophy and chronic microvascular  ischemia.  No acute abnormality. In addition, I personally reviewed the images through the PACS system. I agree with the findings.   During his hospitalization, neurology was consulted and suggested he start amantadine 100 mg twice daily for parkinsonism.    He was discharged to home with home health physical therapy which he is currently enrolled in.     I reviewed your office note from 09/04/2015.   He has had a tremor in both hands for a few months, some memory loss. His daughter, John [name], reports that he has had some instances of missing a turn recently. He is  currently not driving but would like to go back to driving. He has had a history of orthostatic hypotension with improvement after he was started on Florinef several months ago. He has no family history of Parkinson's disease. He feels that the amantadine has helped. He denies any side effects. He has a history of problems sleeping at night and has been on Xanax at night for quite some time. About a year ago he was started on Seroquel at night. He has no history of dream enactments.  His Past Medical History Is Significant For: Past Medical History:  Diagnosis Date  . Anxiety   . Arthritis    "lower back; left ankle" (03/20/2014)  . ASTHMA   . Blood transfusion    "related to OHS, ulcers"  . CAD (coronary artery disease)    a. s/p 2V CABG 1996. b. Last cath 12/2016 -> patent grafts, med rx.  . CAD, ARTERY BYPASS GRAFT   . CKD (chronic kidney disease), stage II   . Colon polyps    adenomatous polyps  . COPD   . Diverticulosis   . Duodenal ulcer without hemorrhage or perforation 12/29/2011  . GASTROESOPHAGEAL REFLUX DISEASE   . HYPERLIPIDEMIA   . Insomnia   . Leukodystrophy   . Nephrolithiasis    "I've had over 320 kidney stones" (03/20/2014)  . Parkinsonism ([place])   . Sleep apnea    "don't always wear it" (03/20/2014)  . SUPRAVENTRICULAR TACHYCARDIA, HX OF   . TIA   . Ulcer     His Past Surgical History Is Significant For: Past Surgical History:  Procedure Laterality Date  . ANKLE FRACTURE SURGERY Left ~ 2012  . CARDIAC CATHETERIZATION    . COLONOSCOPY    . CORONARY ARTERY BYPASS GRAFT  1996   CABG X2  . CYSTOSCOPY WITH URETEROSCOPY, STONE BASKETRY AND STENT PLACEMENT    . Cystourethroscopy with stent removal.    . EXCISIONAL HEMORRHOIDECTOMY    . GASTRECTOMY    . HIATAL HERNIA REPAIR    . IRRIGATION AND DEBRIDEMENT SEBACEOUS CYST     "off my back  . POLYPECTOMY    . PROSTATE SURGERY     "took the center of my prostate out"  . UPPER GASTROINTESTINAL ENDOSCOPY    .  VAGOTOMY      His Family History Is Significant For: Family History  Problem Relation Age of Onset  . Alcohol abuse Father   . Stroke Mother   . Heart disease Brother   . Diabetes Brother   . Heart disease Paternal Grandmother   . Colon cancer Neg Hx   . Stomach cancer Neg Hx     His Social History Is Significant For: Social History   Socioeconomic History  . Marital status: Married    Spouse name: None  . Number of children: 2  . Years of education: 8  . Highest education  level: None  Social Needs  . Financial resource strain: None  . Food insecurity - worry: None  . Food insecurity - inability: None  . Transportation needs - medical: None  . Transportation needs - non-medical: None  Occupational History  . 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 Smoking status: Former Smoker    Packs/day: 0.50    Types: Cigarettes    Last attempt to quit: 10/25/1962    Years since quitting: 54.8  . Smokeless tobacco: Current User    Types: Chew  Substance and Sexual Activity  . Alcohol use: No    Alcohol/week: 0.0 oz  . Drug use: No  . Sexual activity: Yes  Other Topics Concern  . None  Social History Narrative   Denies caffeine use     His Allergies Are:  Allergies  Allergen Reactions  . Gabapentin Other (See Comments)    Severe confusion  . Prozac [Fluoxetine Hcl] Other (See Comments)    Patient state that it does not agree with him  . Soma [Carisoprodol] Other (See Comments)    Patient stated that it does not agree with him  . Beta Adrenergic Blockers Other (See Comments)    REACTION: Bradycardia  :   His Current Medications Are:  Outpatient Encounter Medications as of 08/30/2017  Medication Sig  . ALPRAZolam (XANAX) 1 MG tablet TAKE ONE TABLET BY MOUTH THREE TIMES DAILY AS NEEDED FOR ANXIETY  . aspirin 81 MG tablet Take 81 mg by mouth daily.    . Cholecalciferol (VITAMIN D3) 2000 UNITS capsule Take 8,000 Units by  mouth daily.   . clopidogrel (PLAVIX) 75 MG tablet TAKE 1 TABLET BY MOUTH ONCE DAILY  . fludrocortisone (FLORINEF) 0.1 MG tablet Take 1 tablet (0.1 mg total) by mouth 2 (two) times daily.  [name] glucose blood (FREESTYLE LITE) test strip Test glucose 1 x / day  . Lancets (FREESTYLE) lancets 1 each by Other route as needed for other. Use as instructed  . loperamide (IMODIUM) 2 MG capsule Take 4 mg by mouth as needed for diarrhea or loose stools.   . Melatonin 5 MG CAPS Take 10 mg by mouth at bedtime.   . nitroGLYCERIN (NITROSTAT) 0.4 MG SL tablet Sig: 1 tablet under tongue every 3 to 5 minutes as needed for Angina - Please Dispense # 2 bottles of # 25 tabs  . pantoprazole (PROTONIX) 40 MG tablet TAKE ONE TABLET BY MOUTH ONCE DAILY  . potassium citrate (UROCIT-K 10) 10 MEQ (1080 MG) SR tablet Take 10 mEq by mouth daily.  . prochlorperazine (COMPAZINE) 5 MG tablet TAKE 1-2 TABS THREE TIMES A DAY AS NEEDED FOR NAUSEA  . QUEtiapine (SEROQUEL) 25 MG tablet Take 1 tablet (25 mg total) by mouth 3 (three) times daily.  . simvastatin (ZOCOR) 80 MG tablet Take 80 mg by mouth daily. Patient takes 40 mg by mouth on Monday, Wednesday, Friday.   No facility-administered encounter medications on file as of 08/30/2017.   :  Review of Systems:  Out of a complete 14 point review of systems, all are reviewed and negative with the exception of these symptoms as listed below: Review of Systems  Neurological:       Patient states that he has been doing about the same, no complaints.     Objective:  Neurological Exam  Physical Exam Physical Examination:   Vitals:   08/30/17 1118  BP: 130/69  Pulse: 60    General  Examination: The patient is a very pleasant 82 y.o. male in no acute distress. He appears well-developed and well-nourished and well groomed.   HEENT:Normocephalic, atraumatic, pupils are equal, round and reactive to light and accommodation. He is status post bilateral cataract repairs. He has  eyeglasses, but is not wearing them. Extraocular tracking is fairly good today, mild saccadic breakdown, unchanged findings. He has no change in his stable limitation to upper gaze. He is no significant facial masking or decrease in eye blink rate. No significant mouth dryness, otherwise stable findings, tongue and palate are central and symmetrical, respectively. .   Chest:is clear to auscultation without wheezing, rhonchi or crackles noted.  Heart:sounds are regular and normal without murmurs, rubs or gallops noted.   Abdomen:is soft, non-tender and non-distended with normal bowel sounds appreciated on auscultation.  Extremities:There is no pitting edema in the distal lower extremities bilaterally. Pedal pulses are intact.   Skin: is warm and dry with no trophic changes noted.   Musculoskeletal: exam reveals no obvious joint deformities, tenderness, joint swelling or erythema.  Neurologically:  Mental status: The patient is awake and alert, paying good attention. He is able to give a fairly good history. His memory, attention, language and knowledge are mildly impaired. There is no aphasia, agnosia, apraxia or anomia. There is a mild degree of bradyphrenia. Speech is mildly hypophonic, stable and without dysarthria noted. Mood is congruent and affect is normal.   On 03/04/2016: MMSE 28/30, CDT: 4/4, AFT: 15/min.  On 08/26/2016: MMSE: 28/30, CDT: 3/4, AFT: 14/min.   On 02/23/2017: MMSE: 29/30, CDT: 4/4, AFT: 13/min.   Cranial nerves are as described above under HEENT exam. In addition, shoulder shrug is normal with equal shoulder height noted.  Motor exam: Normal bulk, and strength for age is noted. There are no dyskinesias noted. Reflexes are 1+ in the UEs, absent in both ankles, trace in the knees. Tone is normal to mildly rigid globally. He has no significant cogwheeling, no resting tremor, no postural or action tremor, fine motor skills mildly impaired globally, no  lateralization. He stands up without significant difficulty, does push himself up but posture is stable, age-appropriate. He walks slowly and cautiously, no one-sided limp or veering to one side. Mild insecurity with turns.   Assessment and Plan:   In summary, John [name] is a very pleasant 82 year old male with an underlying medical history of hypertension, hyperlipidemia,very remote history of smoking, history of chewing tobacco,type 2 diabetes, vitamin D deficiency, coronary artery disease, status post CABG(two-vessel, 1996), reflux disease, depression, chronic kidney disease, history of TIA, asthma, COPD, history of kidney stones, history of SVT, history of sleep apnea, arthritis and memory loss, who presents for follow-up consultation. His recent sleep study was negative for OSA but limited secondary to poor sleep efficiency, sleep fragmentation, longer periods of wakefulness, absence of REM sleep. We talked about his test results in detail. We have previously talked about his neuropsychological test results. He has risk factors for vascular dementia. His memory losses in the realm of mild cognitive impairment. He has no telltale signs of Parkinson's disease and has come off of amantadine in the past without repercussions. He had a head CT and brain MRI in Nov 2016, which showed moderate atrophy and moderate white matter changes including hypodensities in both basal ganglia. He is at risk for vascular parkinsonism and vascular dementia. Is a 6 month follow-up, he can see one of our nurse practitioners next time. I asked him to stay well-hydrated  and exercise within his own limitations. He is advised to change positions slowly and turns slowly. I answered all his questions today and he was in agreement. I spent 20 minutes in total face-to-face time with the patient, more than 50% of which was spent in counseling and coordination of care, reviewing test results, reviewing medication and discussing  or reviewing the diagnosis of memory loss, the prognosis and treatment options. Pertinent laboratory and imaging test results that were available during this visit with the patient were reviewed by me and considered in my medical decision making (see chart for details).

## 2017-09-09 ENCOUNTER — Encounter: Payer: Self-pay | Admitting: Cardiovascular Disease

## 2017-09-09 ENCOUNTER — Ambulatory Visit: Payer: PPO | Admitting: Cardiovascular Disease

## 2017-09-09 VITALS — BP 116/60 | HR 88 | Ht 71.5 in | Wt 165.4 lb

## 2017-09-09 DIAGNOSIS — I491 Atrial premature depolarization: Secondary | ICD-10-CM | POA: Diagnosis not present

## 2017-09-09 DIAGNOSIS — E7800 Pure hypercholesterolemia, unspecified: Secondary | ICD-10-CM | POA: Diagnosis not present

## 2017-09-09 DIAGNOSIS — I2510 Atherosclerotic heart disease of native coronary artery without angina pectoris: Secondary | ICD-10-CM

## 2017-09-09 DIAGNOSIS — I6523 Occlusion and stenosis of bilateral carotid arteries: Secondary | ICD-10-CM

## 2017-09-09 DIAGNOSIS — I351 Nonrheumatic aortic (valve) insufficiency: Secondary | ICD-10-CM

## 2017-09-09 NOTE — Patient Instructions (Signed)
Medication Instructions:  Your physician recommends that you continue on your current medications as directed. Please refer to the Current Medication list given to you today.   Labwork: none  Testing/Procedures: Your physician has requested that you have an echocardiogram. Echocardiography is a painless test that uses sound waves to create images of your heart. It provides your doctor with information about the size and shape of your heart and how well your heart's chambers and valves are working. This procedure takes approximately one hour. There are no restrictions for this procedure.  To be done in November 2019  Follow-Up: Your physician recommends that you schedule a follow-up appointment in: 12 months. Please call our office in about 9 months to schedule this appointment    Any Other Special Instructions Will Be Listed Below (If Applicable).     If you need a refill on your cardiac medications before your next appointment, please call your pharmacy.

## 2017-09-09 NOTE — Progress Notes (Signed)
Chief Complaint  Patient presents with  . Coronary Artery Disease   History of Present Illness: 82 yo male with history of CAD s/p 2 V CABG 1996 , DM, HLD, TIA and SVT here today for cardiac follow up. He is on ASA and Plavix for stroke prevention per primary care. Echo November 2014 with normal LV systolic function, mild AI. Carotid dopplers November 2014 with mild bilateral disease. Stress myoview May 2015 with no ischemia. Admitted to [facility] May 2015 with syncope/diarrhea and found to have C. Diff. He was admitted to [facility] in November 2016 and diagnosed with Parkinson's disease. Last cardiac cath in March 2018 in the setting of chest pain and this showed 70% proximal LAD stenosis with patent LIMA graft to LAD and Diagonal. Mild disease in RCA. The Circumflex was normal. LVEF was normal. Cardiac monitor September 2018 with sinus rhythm, PACs and blocked PACs.   He is here today for follow up. The patient denies any chest pain, dyspnea, palpitations, lower extremity edema, orthopnea, PND, dizziness, near syncope or syncope.   Primary Care Physician: Unk Pinto, MD   Past Medical History:  Diagnosis Date  . Anxiety   . Arthritis    "lower back; left ankle" (03/20/2014)  . ASTHMA   . Blood transfusion    "related to OHS, ulcers"  . CAD (coronary artery disease)    a. s/p 2V CABG 1996. b. Last cath 12/2016 -> patent grafts, med rx.  . CAD, ARTERY BYPASS GRAFT   . CKD (chronic kidney disease), stage II   . Colon polyps    adenomatous polyps  . COPD   . Diverticulosis   . Duodenal ulcer without hemorrhage or perforation 12/29/2011  . GASTROESOPHAGEAL REFLUX DISEASE   . HYPERLIPIDEMIA   . Insomnia   . Leukodystrophy   . Nephrolithiasis    "I've had over 320 kidney stones" (03/20/2014)  . Parkinsonism ([place])   . Sleep apnea    "don't always wear it" (03/20/2014)  . SUPRAVENTRICULAR TACHYCARDIA, HX OF   . TIA   . Ulcer     Past Surgical History:  Procedure Laterality Date  .  ANKLE FRACTURE SURGERY Left ~ 2012  . CARDIAC CATHETERIZATION    . COLONOSCOPY    . CORONARY ARTERY BYPASS GRAFT  1996   CABG X2  . CYSTOSCOPY WITH URETEROSCOPY, STONE BASKETRY AND STENT PLACEMENT    . Cystourethroscopy with stent removal.    . EXCISIONAL HEMORRHOIDECTOMY    . GASTRECTOMY    . HIATAL HERNIA REPAIR    . IRRIGATION AND DEBRIDEMENT SEBACEOUS CYST     "off my back  . Left Heart Cath and Cors/Grafts Angiography N/A 12/23/2016   Performed by Burnell Blanks, MD at [facility]  . POLYPECTOMY    . PROSTATE SURGERY     "took the center of my prostate out"  . UPPER GASTROINTESTINAL ENDOSCOPY    . VAGOTOMY      Current Outpatient Medications  Medication Sig Dispense Refill  . ALPRAZolam (XANAX) 1 MG tablet TAKE ONE TABLET BY MOUTH THREE TIMES DAILY AS NEEDED FOR ANXIETY 270 tablet 0  . aspirin 81 MG tablet Take 81 mg by mouth daily.      . Cholecalciferol (VITAMIN D3) 2000 UNITS capsule Take 8,000 Units by mouth daily.     . clopidogrel (PLAVIX) 75 MG tablet TAKE 1 TABLET BY MOUTH ONCE DAILY 90 tablet 1  . fludrocortisone (FLORINEF) 0.1 MG tablet Take 1 tablet (0.1 mg  total) by mouth 2 (two) times daily. 180 tablet 1  . glucose blood (FREESTYLE LITE) test strip Test glucose 1 x / day 90 each 1  . Lancets (FREESTYLE) lancets 1 each by Other route as needed for other. Use as instructed    . loperamide (IMODIUM) 2 MG capsule Take 4 mg by mouth as needed for diarrhea or loose stools.     . Melatonin 5 MG CAPS Take 10 mg by mouth at bedtime.     . nitroGLYCERIN (NITROSTAT) 0.4 MG SL tablet Sig: 1 tablet under tongue every 3 to 5 minutes as needed for Angina - Please Dispense # 2 bottles of # 25 tabs 50 tablet 99  . pantoprazole (PROTONIX) 40 MG tablet TAKE ONE TABLET BY MOUTH ONCE DAILY 90 tablet 3  . potassium citrate (UROCIT-K 10) 10 MEQ (1080 MG) SR tablet Take 10 mEq by mouth daily.    . prochlorperazine (COMPAZINE) 5 MG tablet TAKE 1-2 TABS THREE TIMES A DAY AS  NEEDED FOR NAUSEA 50 tablet 0  . QUEtiapine (SEROQUEL) 25 MG tablet Take 1 tablet (25 mg total) by mouth 3 (three) times daily. 270 tablet 0  . simvastatin (ZOCOR) 80 MG tablet Take 80 mg by mouth daily. Patient takes 40 mg by mouth on Monday, Wednesday, Friday.     No current facility-administered medications for this visit.     Allergies  Allergen Reactions  . Gabapentin Other (See Comments)    Severe confusion  . Prozac [Fluoxetine Hcl] Other (See Comments)    Patient state that it does not agree with him  . Soma [Carisoprodol] Other (See Comments)    Patient stated that it does not agree with him  . Beta Adrenergic Blockers Other (See Comments)    REACTION: Bradycardia    Social History   Socioeconomic History  . Marital status: Married    Spouse name: Not on file  . Number of children: 2  . Years of education: 56  . Highest education level: Not on file  Social Needs  . Financial resource strain: Not on file  . Food insecurity - worry: Not on file  . Food insecurity - inability: Not on file  . Transportation needs - medical: Not on file  . Transportation needs - non-medical: Not on file  Occupational History  . 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 Smoking status: Former Smoker    Packs/day: 0.50    Types: Cigarettes    Last attempt to quit: 10/25/1962    Years since quitting: 54.9  . Smokeless tobacco: Current User    Types: Chew  Substance and Sexual Activity  . Alcohol use: No    Alcohol/week: 0.0 oz  . Drug use: No  . Sexual activity: Yes  Other Topics Concern  . Not on file  Social History Narrative   Denies caffeine use     Family History  Problem Relation Age of Onset  . Alcohol abuse Father   . Stroke Mother   . Heart disease Brother   . Diabetes Brother   . Heart disease Paternal Grandmother   . Colon cancer Neg Hx   . Stomach cancer Neg Hx     Review of Systems:  As stated in the HPI and  otherwise negative.   BP 116/60   Pulse 88   Ht 5' 11.5" (1.816 m)   Wt 165 lb 6.4 oz (75 kg)   SpO2 98%   BMI  22.75 kg/m   Physical Examination:  General: Well developed, well nourished, NAD  HEENT: OP clear, mucus membranes moist  SKIN: warm, dry. No rashes. Neuro: No focal deficits  Musculoskeletal: Muscle strength 5/5 all ext  Psychiatric: Mood and affect normal  Neck: No JVD, no carotid bruits, no thyromegaly, no lymphadenopathy.  Lungs:Clear bilaterally, no wheezes, rhonci, crackles Cardiovascular: Regular rate and rhythm. No murmurs, gallops or rubs. Abdomen:Soft. Bowel sounds present. Non-tender.  Extremities: No lower extremity edema. Pulses are 2 + in the bilateral DP/PT.  Cardiac cath 12/23/16:  Prox LAD to Mid LAD lesion, 70 %stenosed.  Prox RCA lesion, 30 %stenosed.  Mid RCA lesion, 20 %stenosed.  LIMA graft was visualized by angiography and is normal in caliber and anatomically normal.  The left ventricular systolic function is normal.  LV end diastolic pressure is normal.  The left ventricular ejection fraction is 55-65% by visual estimate.  There is no mitral valve regurgitation.   1. Single vessel CAD s/p 2V CABG 2. Moderately severe stenosis mid LAD with patent LIMA to LAD and Diagonal. There is antegrade flow down the LAD and competitive flow from the graft.  3. Mild disease in the RCA 4. Normal LV systolic function Coronary Diagrams   Diagnostic Diagram          Echo 09/04/13:  Left ventricle: The cavity size was normal. Wall thickness was normal. Systolic function was normal. The estimated ejection fraction was in the range of 50% to 55%. Wall motion was normal; there were no regional wall motion abnormalities. Doppler parameters are consistent with abnormal left ventricular relaxation (grade 1 diastolic dysfunction). - Aortic valve: Mild regurgitation. - Mitral valve: There was mild systolic anterior motion of the chordal  structures. - Left atrium: The atrium was mildly dilated.  EKG:  EKG is not  ordered today.  Recent Labs: 08/18/2017: ALT 33; BUN 22; Creat 1.45; Hemoglobin 15.9; Magnesium 1.9; Platelets 194; Potassium 5.2; Sodium 141; TSH 3.03   Lipid Panel    Component Value Date/Time   CHOL 152 08/18/2017 1217   TRIG 124 08/18/2017 1217   HDL 55 08/18/2017 1217   CHOLHDL 2.8 08/18/2017 1217   VLDL 16 05/03/2017 1520   LDLCALC 83 05/03/2017 1520     Wt Readings from Last 3 Encounters:  09/09/17 165 lb 6.4 oz (75 kg)  08/30/17 164 lb (74.4 kg)  08/18/17 159 lb 12.8 oz (72.5 kg)    Other studies Reviewed: Additional studies/ records that were reviewed today include:. Review of the above records demonstrates:    Assessment and Plan:   1.CAD without angina: Cardiac cath March 2018 with stable CAD, patent LIMA to LAD/Diagonal but also antegrade flow through the LAD with 70% proximal LAD stenosis. No chest pain. Will continue ASA, Plavix and statin. He does not tolerate beta blockers due to fatigue.    2. HLD: Lipids followed in primary care. Most recent LDL is near goal. Continue statin.   3. Carotid artery disease: He is known to have mild carotid artery disease by dopplers in 2016.   4. PACs/PVCs: No palpitations.   5 Aortic valve insufficiency: Mild by echo in 2014. Will repeat echo next year.   Current medicines are reviewed at length with the patient today.  The patient does not have concerns regarding medicines.  The following changes have been made:  no change  Labs/ tests ordered today include: None  Orders Placed This Encounter  Procedures  . ECHOCARDIOGRAM COMPLETE    Disposition:  FU with me after his cath.   Signed, Lauree Chandler, MD 09/09/2017 2:37 PM    [address], [place], [place]  [address] Phone: [phone]; Fax: [phone]

## 2017-09-19 DIAGNOSIS — Z961 Presence of intraocular lens: Secondary | ICD-10-CM | POA: Diagnosis not present

## 2017-09-19 DIAGNOSIS — H401431 Capsular glaucoma with pseudoexfoliation of lens, bilateral, mild stage: Secondary | ICD-10-CM | POA: Diagnosis not present

## 2017-09-19 DIAGNOSIS — E119 Type 2 diabetes mellitus without complications: Secondary | ICD-10-CM | POA: Diagnosis not present

## 2017-09-19 DIAGNOSIS — H524 Presbyopia: Secondary | ICD-10-CM | POA: Diagnosis not present

## 2017-09-19 LAB — HM DIABETES EYE EXAM

## 2017-09-26 ENCOUNTER — Other Ambulatory Visit: Payer: Self-pay | Admitting: Internal Medicine

## 2017-09-28 ENCOUNTER — Encounter: Payer: Self-pay | Admitting: *Deleted

## 2017-11-08 ENCOUNTER — Encounter: Payer: Self-pay | Admitting: Internal Medicine

## 2017-11-21 ENCOUNTER — Ambulatory Visit (INDEPENDENT_AMBULATORY_CARE_PROVIDER_SITE_OTHER): Payer: PPO | Admitting: Internal Medicine

## 2017-11-21 ENCOUNTER — Encounter: Payer: Self-pay | Admitting: Internal Medicine

## 2017-11-21 ENCOUNTER — Other Ambulatory Visit: Payer: Self-pay | Admitting: Internal Medicine

## 2017-11-21 VITALS — BP 138/76 | HR 80 | Temp 97.3°F | Resp 16 | Ht 71.5 in | Wt 160.0 lb

## 2017-11-21 DIAGNOSIS — Z0000 Encounter for general adult medical examination without abnormal findings: Secondary | ICD-10-CM

## 2017-11-21 DIAGNOSIS — I951 Orthostatic hypotension: Secondary | ICD-10-CM

## 2017-11-21 DIAGNOSIS — N182 Chronic kidney disease, stage 2 (mild): Secondary | ICD-10-CM

## 2017-11-21 DIAGNOSIS — Z1211 Encounter for screening for malignant neoplasm of colon: Secondary | ICD-10-CM

## 2017-11-21 DIAGNOSIS — K219 Gastro-esophageal reflux disease without esophagitis: Secondary | ICD-10-CM

## 2017-11-21 DIAGNOSIS — Z87891 Personal history of nicotine dependence: Secondary | ICD-10-CM

## 2017-11-21 DIAGNOSIS — E1142 Type 2 diabetes mellitus with diabetic polyneuropathy: Secondary | ICD-10-CM

## 2017-11-21 DIAGNOSIS — I10 Essential (primary) hypertension: Secondary | ICD-10-CM

## 2017-11-21 DIAGNOSIS — Z86010 Personal history of colonic polyps: Secondary | ICD-10-CM

## 2017-11-21 DIAGNOSIS — Z0001 Encounter for general adult medical examination with abnormal findings: Secondary | ICD-10-CM

## 2017-11-21 DIAGNOSIS — I25810 Atherosclerosis of coronary artery bypass graft(s) without angina pectoris: Secondary | ICD-10-CM

## 2017-11-21 DIAGNOSIS — E782 Mixed hyperlipidemia: Secondary | ICD-10-CM | POA: Diagnosis not present

## 2017-11-21 DIAGNOSIS — Z125 Encounter for screening for malignant neoplasm of prostate: Secondary | ICD-10-CM

## 2017-11-21 DIAGNOSIS — R972 Elevated prostate specific antigen [PSA]: Secondary | ICD-10-CM

## 2017-11-21 DIAGNOSIS — E1122 Type 2 diabetes mellitus with diabetic chronic kidney disease: Secondary | ICD-10-CM | POA: Diagnosis not present

## 2017-11-21 DIAGNOSIS — Z136 Encounter for screening for cardiovascular disorders: Secondary | ICD-10-CM

## 2017-11-21 DIAGNOSIS — Z79899 Other long term (current) drug therapy: Secondary | ICD-10-CM

## 2017-11-21 DIAGNOSIS — Z1212 Encounter for screening for malignant neoplasm of rectum: Secondary | ICD-10-CM

## 2017-11-21 DIAGNOSIS — E559 Vitamin D deficiency, unspecified: Secondary | ICD-10-CM

## 2017-11-21 NOTE — Progress Notes (Signed)
[facility]   John [name], M.D.     John [name], P.A.-C Liane Comber, [place]                [address] [place] [address] Telephone [phone] Telefax [phone] Annual  Screening/Preventative Visit  & Comprehensive Evaluation & Examination     This very nice 83 y.o. MWM presents for a Screening/Preventative Visit & comprehensive evaluation and management of multiple medical co-morbidities.  Patient has been followed for HTN, ASHD/CABG,  T2_NIDDM, Hyperlipidemia and Vitamin D Deficiency. Patient has hx/o mild Dementia & is followed by Dr Rexene Alberts for suspect possible early or atypical Parkinson's Dz. Patient also has hx/o recurrent kidney stones (>200+) followed in the past by Dr Dutch Gray.      HTN predates circa 1960's. Patient's BP been labile with postural hypotension   Today's BP was initially sl elevated ad rechecked at goal - 114/72 standing. Patient monitors home BP's closely and takes 1 to 2 Florinef daily to avoid postural Hypotension. Patient underwent CABG x 2 V in 1996 And has done well since. Patient denies any cardiac symptoms as chest pain, palpitations, shortness of breath, dizziness or ankle swelling.     Patient's hyperlipidemia is controlled with diet and medications. Patient denies myalgias or other medication SE's. Last lipids were at goal: Lab Results  Component Value Date   CHOL 152 08/18/2017   HDL 55 08/18/2017   LDLCALC 83 05/03/2017   TRIG 124 08/18/2017   CHOLHDL 2.8 08/18/2017      Patient has T2_NIDDM since 1995 & with weight loss he was able to wean off of his Metformin by 2008 and has maintained his weight and glucose control.  He does have mild peripheral Neuropathy with decreased DTR's and Vibratory. He denies reactive hypoglycemic symptoms, visual blurring, diabetic polys or paresthesias. Last A1c was Normal and at goal: Lab Results   Component Value Date   HGBA1C 5.1 05/03/2017       Finally, patient has history of Vitamin D Deficiency ("18"/2008) and last vitamin D was at goal: Lab Results  Component Value Date   VD25OH 78 05/03/2017   Current Outpatient Medications on File Prior to Visit  Medication Sig  . aspirin 81 MG tablet Take 81 mg by mouth daily.    . Cholecalciferol (VITAMIN D3) 2000 UNITS capsule Take 8,000 Units by mouth daily.   . clopidogrel (PLAVIX) 75 MG tablet TAKE 1 TABLET BY MOUTH ONCE DAILY  . fludrocortisone (FLORINEF) 0.1 MG tablet Take 1 tablet (0.1 mg total) by mouth 2 (two) times daily.  [name] glucose blood (FREESTYLE LITE) test strip TEST once daily  . Lancets (FREESTYLE) lancets 1 each by Other route as needed for other. Use as instructed  . loperamide (IMODIUM) 2 MG capsule Take 4 mg by mouth as needed for diarrhea or loose stools.   . Melatonin 5 MG CAPS Take 10 mg by mouth at bedtime.   . nitroGLYCERIN (NITROSTAT) 0.4 MG SL tablet Sig: 1 tablet under tongue every 3 to 5 minutes as needed for Angina - Please Dispense # 2 bottles of # 25 tabs  . pantoprazole (PROTONIX) 40 MG tablet TAKE ONE TABLET BY MOUTH ONCE DAILY  . potassium citrate (UROCIT-K 10) 10 MEQ (1080 MG) SR tablet Take 10 mEq by mouth daily.  . prochlorperazine (COMPAZINE) 5 MG tablet TAKE  1-2 TABS THREE TIMES A DAY AS NEEDED FOR NAUSEA  . QUEtiapine (SEROQUEL) 25 MG tablet Take 1 tablet (25 mg total) by mouth 3 (three) times daily.  . simvastatin (ZOCOR) 80 MG tablet Take 80 mg by mouth daily. Patient takes 40 mg by mouth on Monday, Wednesday, Friday.   No current facility-administered medications on file prior to visit.    Allergies  Allergen Reactions  . Gabapentin Other (See Comments)    Severe confusion  . Prozac [Fluoxetine Hcl] Other (See Comments)    Patient state that it does not agree with him  . Soma [Carisoprodol] Other (See Comments)    Patient stated that it does not agree with him  . Beta Adrenergic  Blockers Other (See Comments)    REACTION: Bradycardia   Past Medical History:  Diagnosis Date  . Anxiety   . Arthritis    "lower back; left ankle" (03/20/2014)  . ASTHMA   . Blood transfusion    "related to OHS, ulcers"  . CAD (coronary artery disease)    a. s/p 2V CABG 1996. b. Last cath 12/2016 -> patent grafts, med rx.  . CAD, ARTERY BYPASS GRAFT   . CKD (chronic kidney disease), stage II   . Colon polyps    adenomatous polyps  . COPD   . Diverticulosis   . Duodenal ulcer without hemorrhage or perforation 12/29/2011  . GASTROESOPHAGEAL REFLUX DISEASE   . HYPERLIPIDEMIA   . Insomnia   . Leukodystrophy   . Nephrolithiasis    "I've had over 320 kidney stones" (03/20/2014)  . Parkinsonism ([place])   . Sleep apnea    "don't always wear it" (03/20/2014)  . SUPRAVENTRICULAR TACHYCARDIA, HX OF   . TIA   . Ulcer    Health Maintenance  Topic Date Due  . FOOT EXAM  10/12/2017  . URINE MICROALBUMIN  10/12/2017  . HEMOGLOBIN A1C  11/03/2017  . COLONOSCOPY  09/06/2018  . OPHTHALMOLOGY EXAM  09/19/2018  . TETANUS/TDAP  08/02/2023  . INFLUENZA VACCINE  Completed  . PNA vac Low Risk Adult  Completed   Immunization History  Administered Date(s) Administered  . Influenza, High Dose Seasonal PF 08/13/2013, 08/05/2014, 07/23/2015, 06/22/2016, 07/18/2017  . Pneumococcal Conjugate-13 08/27/2014  . Pneumococcal Polysaccharide-23 08/06/2012  . Tdap 08/01/2013  . Zoster 05/02/2006   Last Colon -  Past Surgical History:  Procedure Laterality Date  . ANKLE FRACTURE SURGERY Left ~ 2012  . CARDIAC CATHETERIZATION    . COLONOSCOPY    . CORONARY ARTERY BYPASS GRAFT  1996   CABG X2  . CYSTOSCOPY WITH URETEROSCOPY, STONE BASKETRY AND STENT PLACEMENT    . Cystourethroscopy with stent removal.    . EXCISIONAL HEMORRHOIDECTOMY    . GASTRECTOMY    . HIATAL HERNIA REPAIR    . IRRIGATION AND DEBRIDEMENT SEBACEOUS CYST     "off my back  . LEFT HEART CATH AND CORS/GRAFTS ANGIOGRAPHY N/A 12/23/2016    Procedure: Left Heart Cath and Cors/Grafts Angiography;  Surgeon: Burnell Blanks, MD;  Location: [facility];  Service: Cardiovascular;  Laterality: N/A;  . POLYPECTOMY    . PROSTATE SURGERY     "took the center of my prostate out"  . UPPER GASTROINTESTINAL ENDOSCOPY    . VAGOTOMY     Family History  Problem Relation Age of Onset  . Alcohol abuse Father   . Stroke Mother   . Heart disease Brother   . Diabetes Brother   . Heart disease Paternal Grandmother   .  Colon cancer Neg Hx   . Stomach cancer Neg Hx    Social History   Socioeconomic History  . Marital status: Married    Spouse name: Not on file  . Number of children: 2  . Years of education: 14  Occupational History  . 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 Smoking status: Former Smoker    Packs/day: 0.50    Types: Cigarettes    Last attempt to quit: 10/25/1962    Years since quitting: 55.1  . Smokeless tobacco: Current User    Types: Chew  Substance and Sexual Activity  . Alcohol use: No    Alcohol/week: 0.0 oz  . Drug use: No  . Sexual activity: Yes  Social History Narrative   Denies caffeine use     ROS Constitutional: Denies fever, chills, weight loss/gain, headaches, insomnia,  night sweats or change in appetite. Does c/o fatigue. Eyes: Denies redness, blurred vision, diplopia, discharge, itchy or watery eyes.  ENT: Denies discharge, congestion, post nasal drip, epistaxis, sore throat, earache, hearing loss, dental pain, Tinnitus, Vertigo, Sinus pain or snoring.  Cardio: Denies chest pain, palpitations, irregular heartbeat, syncope, dyspnea, diaphoresis, orthopnea, PND, claudication or edema Respiratory: denies cough, dyspnea, DOE, pleurisy, hoarseness, laryngitis or wheezing.  Gastrointestinal: Denies dysphagia, heartburn, reflux, water brash, pain, cramps, nausea, vomiting, bloating, diarrhea, constipation, hematemesis, melena, hematochezia,  jaundice or hemorrhoids Genitourinary: Denies dysuria, frequency, urgency, nocturia, hesitancy, discharge, hematuria or flank pain Musculoskeletal: Denies arthralgia, myalgia, stiffness, Jt. Swelling, pain, limp or strain/sprain. Denies Falls. Skin: Denies puritis, rash, hives, warts, acne, eczema or change in skin lesion Neuro: No weakness, tremor, incoordination, spasms, paresthesia or pain Psychiatric: Denies confusion, memory loss or sensory loss. Denies Depression. Endocrine: Denies change in weight, skin, hair change, nocturia, and paresthesia, diabetic polys, visual blurring or hyper / hypo glycemic episodes.  Heme/Lymph: No excessive bleeding, bruising or enlarged lymph nodes.  Physical Exam  BP 138/76   Pulse 80   Temp (!) 97.3 F (36.3 C)   Resp 16   Ht 5' 11.5" (1.816 m)   Wt 160 lb (72.6 kg)   BMI 22.00 kg/m   Postural Sitting BP 129/83   P 91    And    Standing BP 114/71   P109  General Appearance: Well nourished and well groomed and in no apparent distress.  Eyes: PERRLA, EOMs, conjunctiva no swelling or erythema, normal fundi and vessels. Sinuses: No frontal/maxillary tenderness ENT/Mouth: EACs patent / TMs  nl. Nares clear without erythema, swelling, mucoid exudates. Oral hygiene is good. No erythema, swelling, or exudate. Tongue normal, non-obstructing. Tonsils not swollen or erythematous. Hearing normal.  Neck: Supple, thyroid normal. No bruits, nodes or JVD. Respiratory: Respiratory effort normal.  BS equal and clear bilateral without rales, rhonci, wheezing or stridor. Cardio: Heart sounds are normal with regular rate and rhythm and no murmurs, rubs or gallops. Peripheral pulses are normal and equal bilaterally without edema. No aortic or femoral bruits. Chest: symmetric with normal excursions and percussion.  Abdomen: Soft, with Nl bowel sounds. Nontender, no guarding, rebound, hernias, masses, or organomegaly.  Lymphatics: Non tender without lymphadenopathy.   Genitourinary:  Testes nl. DRE - deferred for age. Musculoskeletal: Full ROM all peripheral extremities, joint stability, 5/5 strength, and normal gait. Skin: Warm and dry without rashes, lesions, cyanosis, clubbing or  ecchymosis.  Neuro: Cranial nerves intact, reflexes equal bilaterally. Normal muscle tone, no cerebellar symptoms. Sensation intact to touch and Monofilament, but  decreased to vibratory to the toes bilaterally. Pysch: Alert and oriented X 3 with normal affect, insight and judgment appropriate.   Assessment and Plan  1. Annual Preventative/Screening Exam   2. Essential hypertension  - EKG 12-Lead - [place], RETROPERITNL ABD,  LTD - Urinalysis, Routine w reflex microscopic - Microalbumin / creatinine urine ratio - CBC with Differential/Platelet - BASIC METABOLIC PANEL WITH GFR - Magnesium - TSH  3. Hyperlipidemia, mixed  - EKG 12-Lead - [place], RETROPERITNL ABD,  LTD - Hepatic function panel - Lipid panel - TSH  4. Type 2 diabetes mellitus with stage 2 chronic kidney disease, without long-term current use of insulin (HCC)  - EKG 12-Lead - [place], RETROPERITNL ABD,  LTD - Urinalysis, Routine w reflex microscopic - Microalbumin / creatinine urine ratio - HM DIABETES FOOT EXAM - LOW EXTREMITY NEUR EXAM DOCUM - Hemoglobin A1c - Insulin, random  5. Vitamin D deficiency  - VITAMIN D 25 Hydroxy  6. Atherosclerosis of coronary artery bypass graft of native heart without angina pectoris  - EKG 12-Lead  7. Autonomic postural hypotension  - CBC with Differential/Platelet - BASIC METABOLIC PANEL WITH GFR  8. Gastroesophageal reflux disease  - CBC with Differential/Platelet  9. Screening for colorectal cancer  - POC Hemoccult Bld/Stl   10. Prostate cancer screening  - PSA  11. Elevated PSA  - PSA  12. Screening for ischemic heart disease  - EKG 12-Lead  13. Former smoker  - EKG 12-Lead - [place], RETROPERITNL ABD,  LTD  14. Screening for AAA (aortic  abdominal aneurysm)  - [place], RETROPERITNL ABD,  LTD  15. Medication management  - Urinalysis, Routine w reflex microscopic - Microalbumin / creatinine urine ratio - CBC with Differential/Platelet - BASIC METABOLIC PANEL WITH GFR - Hepatic function panel - Magnesium - Lipid panel - TSH - Hemoglobin A1c - Insulin, random - VITAMIN D 25 Hydroxy         Patient was counseled in prudent diet, weight control to achieve/maintain BMI less than 25, BP monitoring, regular exercise and medications as discussed.  Discussed med effects and SE's. Routine screening labs and tests as requested with regular follow-up as recommended. Over 40 minutes of exam, counseling, chart review and high complex critical decision making was performed

## 2017-11-21 NOTE — Patient Instructions (Signed)

## 2017-11-22 LAB — VITAMIN D 25 HYDROXY (VIT D DEFICIENCY, FRACTURES): Vit D, 25-Hydroxy: 70 ng/mL (ref 30–100)

## 2017-11-22 LAB — HEMOGLOBIN A1C
HEMOGLOBIN A1C: 5.2 %{Hb} (ref <5.7)
Mean Plasma Glucose: 103 (calc)
eAG (mmol/L): 5.7 (calc)

## 2017-11-22 LAB — BASIC METABOLIC PANEL WITH GFR
BUN / CREAT RATIO: 16 (calc) (ref 6–22)
BUN: 21 mg/dL (ref 7–25)
CHLORIDE: 106 mmol/L (ref 98–110)
CO2: 29 mmol/L (ref 20–32)
Calcium: 9.8 mg/dL (ref 8.6–10.3)
Creat: 1.28 mg/dL — ABNORMAL HIGH (ref 0.70–1.11)
GFR, EST AFRICAN AMERICAN: 61 mL/min/{1.73_m2} (ref >=60)
GFR, Est Non African American: 53 mL/min/{1.73_m2} — ABNORMAL LOW (ref >=60)
Glucose, Bld: 95 mg/dL (ref 65–99)
Potassium: 4 mmol/L (ref 3.5–5.3)
SODIUM: 143 mmol/L (ref 135–146)

## 2017-11-22 LAB — HEPATIC FUNCTION PANEL
AG Ratio: 1.4 (calc) (ref 1.0–2.5)
ALKALINE PHOSPHATASE (APISO): 57 U/L (ref 40–115)
ALT: 34 U/L (ref 9–46)
AST: 30 U/L (ref 10–35)
Albumin: 4.3 g/dL (ref 3.6–5.1)
BILIRUBIN DIRECT: 0.2 mg/dL (ref 0.0–0.2)
BILIRUBIN INDIRECT: 0.6 mg/dL (ref 0.2–1.2)
GLOBULIN: 3 g/dL (ref 1.9–3.7)
Total Bilirubin: 0.8 mg/dL (ref 0.2–1.2)
Total Protein: 7.3 g/dL (ref 6.1–8.1)

## 2017-11-22 LAB — CBC WITH DIFFERENTIAL/PLATELET
BASOS PCT: 1.6 %
Basophils Absolute: 70 cells/uL (ref 0–200)
EOS PCT: 2.9 %
Eosinophils Absolute: 128 cells/uL (ref 15–500)
HCT: 43.9 % (ref 38.5–50.0)
Hemoglobin: 15.3 g/dL (ref 13.2–17.1)
Lymphs Abs: 1575 cells/uL (ref 850–3900)
MCH: 32.8 pg (ref 27.0–33.0)
MCHC: 34.9 g/dL (ref 32.0–36.0)
MCV: 94 fL (ref 80.0–100.0)
MPV: 10.4 fL (ref 7.5–12.5)
Monocytes Relative: 7 %
Neutro Abs: 2319 cells/uL (ref 1500–7800)
Neutrophils Relative %: 52.7 %
PLATELETS: 202 10*3/uL (ref 140–400)
RBC: 4.67 10*6/uL (ref 4.20–5.80)
RDW: 12.9 % (ref 11.0–15.0)
TOTAL LYMPHOCYTE: 35.8 %
WBC: 4.4 10*3/uL (ref 3.8–10.8)
WBCMIX: 308 {cells}/uL (ref 200–950)

## 2017-11-22 LAB — LIPID PANEL
CHOL/HDL RATIO: 2.7 (calc) (ref <5.0)
Cholesterol: 158 mg/dL (ref <200)
HDL: 58 mg/dL (ref >40)
LDL CHOLESTEROL (CALC): 80 mg/dL
Non-HDL Cholesterol (Calc): 100 mg/dL (calc) (ref <130)
Triglycerides: 103 mg/dL (ref <150)

## 2017-11-22 LAB — TSH: TSH: 2.33 m[IU]/L (ref 0.40–4.50)

## 2017-11-22 LAB — URINALYSIS, ROUTINE W REFLEX MICROSCOPIC
Bilirubin Urine: NEGATIVE
Glucose, UA: NEGATIVE
Hgb urine dipstick: NEGATIVE
Ketones, ur: NEGATIVE
LEUKOCYTES UA: NEGATIVE
NITRITE: NEGATIVE
PROTEIN: NEGATIVE
Specific Gravity, Urine: 1.019 (ref 1.001–1.03)
pH: 5.5 (ref 5.0–8.0)

## 2017-11-22 LAB — MICROALBUMIN / CREATININE URINE RATIO
CREATININE, URINE: 111 mg/dL (ref 20–320)
MICROALB UR: 0.5 mg/dL
MICROALB/CREAT RATIO: 5 ug/mg{creat} (ref <30)

## 2017-11-22 LAB — INSULIN, RANDOM: INSULIN: 4 u[IU]/mL (ref 2.0–19.6)

## 2017-11-22 LAB — PSA: PSA: 0.5 ng/mL (ref <=4.0)

## 2017-11-22 LAB — MAGNESIUM: MAGNESIUM: 1.9 mg/dL (ref 1.5–2.5)

## 2018-01-10 ENCOUNTER — Other Ambulatory Visit: Payer: Self-pay | Admitting: Internal Medicine

## 2018-01-10 DIAGNOSIS — N200 Calculus of kidney: Secondary | ICD-10-CM

## 2018-01-12 ENCOUNTER — Other Ambulatory Visit: Payer: Self-pay | Admitting: Internal Medicine

## 2018-02-19 ENCOUNTER — Encounter: Payer: Self-pay | Admitting: Adult Health

## 2018-02-19 DIAGNOSIS — G4700 Insomnia, unspecified: Secondary | ICD-10-CM | POA: Insufficient documentation

## 2018-02-19 DIAGNOSIS — F5105 Insomnia due to other mental disorder: Secondary | ICD-10-CM | POA: Insufficient documentation

## 2018-02-19 DIAGNOSIS — F418 Other specified anxiety disorders: Secondary | ICD-10-CM | POA: Insufficient documentation

## 2018-02-19 NOTE — Progress Notes (Signed)
MEDICARE ANNUAL WELLNESS VISIT AND FOLLOW UP Assessment:   Diagnoses and all orders for this visit:  Encounter for Medicare annual wellness exam  Essential hypertension Monitor blood pressure at home; call if consistently over 130/80 Continue DASH diet.   Reminder to go to the ER if any CP, SOB, nausea, dizziness, severe HA, changes vision/speech, left arm numbness and tingling and jaw pain.  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  Followed by cardiology  Autonomic postural hypotension Symptoms stable on florinef   Gastric AV malformation Followed by GI  Insomnia, unspecified type Insomnia- good sleep hygiene discussed, increase day time activity Cautioned risks with xanax, seroquel Continue follow up with neurology  Chronic obstructive pulmonary disease, unspecified COPD type ([place]) + hx of smoking Symptoms stable off of inhalers  GERD Well managed on current medications; continue PPI for hx of duodenal ulcer/bleeding Discussed diet, avoiding triggers and other lifestyle changes  Diabetic peripheral neuropathy ([place]) Denies recent symptoms  CKD stage G3a/A1, GFR 45-59 and albumin creatinine ratio <30 mg/g ([facility]) Increase fluids, avoid NSAIDS, monitor sugars, will monitor Check CMP/GFR  Vitamin D deficiency At goal at recent check; continue to recommend supplementation for goal of 70-100 Defer vitamin D level  Mixed hyperlipidemia Continue medications Continue low cholesterol diet and exercise.  Check lipid panel.   Medication management CBC, CMP/GFR  History of TIA (transient ischemic attack) BP/LDL at goal Continue plavix Followed by neurology  History of colonic polyps Reminded due for follow up with Dr. Deatra Ina November of this year  BMI 20.0-20.9, adult Continue to recommend diet heavy in fruits and veggies and low in animal  meats, cheeses, and dairy products, appropriate calorie intake Discuss exercise recommendations routinely Continue to monitor weight at each visit Advised to consider increasing fat and protein intake, avoid further weight loss Denies early satiety or other concerning symptoms  Bilateral impacted cerumen - stop using Qtips, irrigation used in the office without complications, use OTC drops/oil at home to prevent reoccurence   Over 30 minutes of exam, counseling, chart review, and critical decision making was performed  Future Appointments  Date Time Provider [place]  02/27/2018  1:30 PM Ward Givens, NP [facility] None  05/24/2018  2:30 PM Unk Pinto, MD [facility] None  09/11/2018  2:00 PM [facility] 1 [id number]  12/18/2018 11:00 AM Unk Pinto, MD [facility] None     Plan:   During the course of the visit the patient was educated and counseled about appropriate screening and preventive services including:    Pneumococcal vaccine   Influenza vaccine  Prevnar 13  Td vaccine  Screening electrocardiogram  Colorectal cancer screening  Diabetes screening  Glaucoma screening  Nutrition counseling    Subjective:  John [name] is a 83 y.o. male who presents for Medicare Annual Wellness Visit and 3 month follow up for HTN, hyperlipidemia, glucose management, and vitamin D Def. Patient has hx/o mild Dementia & is followed by Dr Rexene Alberts for suspect possible early or atypical Parkinson's Dz. Patient also has hx/o recurrent kidney stones (>200+) followed by Dr Dutch Gray as needed. HTN predates 1960's, recenty labile with postural hypotension and now takes 1 to 2 Florinef daily to avoid postural Hypotension. Patient underwent CABG x 2 V in 1996 And has done well since. Continues to follow with cardiology.   he has ongoing insomnia and is currently on melatonin, seroquel, xanax, reports symptoms are well controlled on  current regimen. he  currently takes 1 mg daily at night to sleep; has not done well with attempts to taper.   BMI is Body mass index is 20.9 kg/m., he has not been working on diet and exercise. He reports generally reduced appetite from previous. Denies any early satiety, GI symptoms other than ongoing intermittent diarrhea (ongoing for several decades), denies hematochezia, melena, abdominal fullness.  Wt Readings from Last 3 Encounters:  02/20/18 152 lb (68.9 kg)  11/21/17 160 lb (72.6 kg)  09/09/17 165 lb 6.4 oz (75 kg)   His blood pressure has been controlled at home, today their BP is BP: (!) 110/58 He does not workout. He denies chest pain, shortness of breath, dizziness.   He is on cholesterol medication and denies myalgias. His cholesterol is at goal. The cholesterol last visit was:   Lab Results  Component Value Date   CHOL 158 11/21/2017   HDL 58 11/21/2017   LDLCALC 80 11/21/2017   TRIG 103 11/21/2017   CHOLHDL 2.7 11/21/2017   He has been working on diet and exercise for glucose management (hx of lifestyle controlled diabetes), and denies foot ulcerations, hyperglycemia, hypoglycemia , increased appetite, nausea, paresthesia of the feet, polydipsia, polyuria, visual disturbances, vomiting and weight loss. Last A1C in the office was:  Lab Results  Component Value Date   HGBA1C 5.2 11/21/2017   Last GFR Lab Results  Component Value Date   GFRNONAA 46 (L) 11/21/2017    Patient is on Vitamin D supplement and at goal at recent check:    Lab Results  Component Value Date   VD25OH 70 11/21/2017      Medication Review:  Current Outpatient Medications (Endocrine & Metabolic):  .  fludrocortisone (FLORINEF) 0.1 MG tablet, Take 1 tablet (0.1 mg total) by mouth 2 (two) times daily.  Current Outpatient Medications (Cardiovascular):  .  nitroGLYCERIN (NITROSTAT) 0.4 MG SL tablet, Sig: 1 tablet under tongue every 3 to 5 minutes as needed for Angina - Please Dispense # 2 bottles of # 25 tabs .   simvastatin (ZOCOR) 80 MG tablet, Take 80 mg by mouth daily. Patient takes 40 mg by mouth on Monday, Wednesday, Friday.   Current Outpatient Medications (Analgesics):  .  aspirin 81 MG tablet, Take 81 mg by mouth daily.    Current Outpatient Medications (Hematological):  .  clopidogrel (PLAVIX) 75 MG tablet, TAKE 1 TABLET BY MOUTH ONCE DAILY  Current Outpatient Medications (Other):  [name]  ALPRAZolam (XANAX) 1 MG tablet, Take 1/2 to 1 tablet 2 to 3 x/day ONLY if needed for Anxiety Attack and please try to limit to 5 days /week to avoid addiction .  Cholecalciferol (VITAMIN D3) 2000 UNITS capsule, Take 8,000 Units by mouth daily.  [name]  glucose blood (FREESTYLE LITE) test strip, TEST once daily .  Lancets (FREESTYLE) lancets, 1 each by Other route as needed for other. Use as instructed .  loperamide (IMODIUM) 2 MG capsule, Take 4 mg by mouth as needed for diarrhea or loose stools.  .  Melatonin 5 MG CAPS, Take 10 mg by mouth at bedtime.  .  pantoprazole (PROTONIX) 40 MG tablet, TAKE ONE TABLET BY MOUTH ONCE DAILY .  potassium citrate (UROCIT-K) 10 MEQ (1080 MG) SR tablet, TAKE ONE TABLET BY MOUTH TWICE DAILY .  prochlorperazine (COMPAZINE) 5 MG tablet, TAKE 1-2 TABS THREE TIMES A DAY AS NEEDED FOR NAUSEA .  QUEtiapine (SEROQUEL) 25 MG tablet, TAKE 1 TABLET BY MOUTH THREE TIMES DAILY  Allergies:  Allergies  Allergen Reactions  . Gabapentin Other (See Comments)    Severe confusion  . Prozac [Fluoxetine Hcl] Other (See Comments)    Patient state that it does not agree with him  . Soma [Carisoprodol] Other (See Comments)    Patient stated that it does not agree with him  . Beta Adrenergic Blockers Other (See Comments)    REACTION: Bradycardia    Current Problems (verified) has Mixed hyperlipidemia; Essential hypertension; ASCAD/CABG; History of TIA (transient ischemic attack); ASTHMA; COPD (chronic obstructive pulmonary disease) ([place]); GERD; History of colonic polyps; Gastric AV malformation;  Medication management; Vitamin D deficiency; CKD stage G3a/A1, GFR 45-59 and albumin creatinine ratio <30 mg/g ([place]); Autonomic postural hypotension; Esophageal reflux; Body mass index (BMI) of 21.0-21.9 in adult; Frequent falls; Precordial pain; Diabetic peripheral neuropathy ([place]); and Insomnia on their problem list.  Screening Tests Immunization History  Administered Date(s) Administered  . Influenza, High Dose Seasonal PF 08/13/2013, 08/05/2014, 07/23/2015, 06/22/2016, 07/18/2017  . Pneumococcal Conjugate-13 08/27/2014  . Pneumococcal Polysaccharide-23 08/06/2012  . Tdap 08/01/2013  . Zoster 05/02/2006   Preventative care: Last colonoscopy: 08/2013 Dr. Deatra Ina rec. 5 year follow up  Prior vaccinations: TD or Tdap: 2014  Influenza: 2018  Pneumococcal: 2013 Prevnar13: 2015 Shingles/Zostavax: 2007  Names of Other Physician/Practitioners you currently use: 1. [facility] here for primary care 2. Dr. Delton Coombes, eye doctor, last visit 11/2017 3. Dr. Gwendolyn Fill, dentist, last visit 2019  Patient Care Team: Unk Pinto, MD as PCP - General Angelena Form Annita Brod, MD as Consulting Physician (Cardiology) Inda Castle, MD (Inactive) as Consulting Physician (Gastroenterology) Jannette Spanner, MD as Referring Physician (Dermatology)  Surgical: He  has a past surgical history that includes Cardiac catheterization; Gastrectomy; Vagotomy; Cystourethroscopy with stent removal.; Irrigation and debridement sabaceous cyst; Colonoscopy; Upper gastrointestinal endoscopy; Excisional hemorrhoidectomy; Polypectomy; Coronary artery bypass graft (1996); Prostate surgery; Cystoscopy with ureteroscopy, stone basketry and stent placement; Ankle fracture surgery (Left, ~ 2012); Hiatal hernia repair; and LEFT HEART CATH AND CORS/GRAFTS ANGIOGRAPHY (N/A, 12/23/2016). Family His family history includes Alcohol abuse in his father; Diabetes in his brother; Heart disease in  his brother and paternal grandmother; Stroke in his mother. Social history  He reports that he quit smoking about 55 years ago. His smoking use included cigarettes. He smoked 0.50 packs per day. His smokeless tobacco use includes chew. He reports that he does not drink alcohol or use drugs.  MEDICARE WELLNESS OBJECTIVES: Physical activity: Current Exercise Habits: The patient does not participate in regular exercise at present, Exercise limited by: respiratory conditions(s) Cardiac risk factors: Cardiac Risk Factors include: advanced age (>66men, >48 women);hypertension;male gender;smoking/ tobacco exposure Depression/mood screen:   Depression screen [facility] 2/9 02/20/2018  Decreased Interest 0  Down, Depressed, Hopeless 0  PHQ - 2 Score 0    ADLs:  In your present state of health, do you have any difficulty performing the following activities: 02/20/2018 11/21/2017  Hearing? N N  Vision? N N  Difficulty concentrating or making decisions? N N  Walking or climbing stairs? Y N  Comment Gets short of breath, no trouble at home -  Dressing or bathing? N N  Doing errands, shopping? N N  Preparing Food and eating ? N -  Using the Toilet? N -  In the past six months, have you accidently leaked urine? N -  Do you have problems with loss of bowel control? N -  Managing your Medications? N -  Managing your Finances? N -  Housekeeping  or managing your Housekeeping? N -  Some recent data might be hidden     Cognitive Testing  Alert? Yes  Normal Appearance?Yes  Oriented to person? Yes  Place? Yes   Time? Yes  Recall of three objects?  Yes  Can perform simple calculations? Yes  Displays appropriate judgment?Yes  Can read the correct time from a watch face?Yes  EOL planning: Does Patient Have a Medical Advance Directive?: Yes Type of Advance Directive: Healthcare Power of Attorney, Living will Does patient want to make changes to medical advance directive?: No - Patient declined Copy of  [place] in Chart?: No - copy requested   Objective:   Today's Vitals   02/20/18 1425  BP: (!) 110/58  Pulse: 79  Temp: (!) 97.3 F (36.3 C)  SpO2: 98%  Weight: 152 lb (68.9 kg)  Height: 5' 11.5" (1.816 m)   Body mass index is 20.9 kg/m.  General appearance: alert, no distress, WD/WN, male HEENT: normocephalic, sclerae anicteric; TMs not visible, obstructed bilaterally by cerumen; nares patent, no discharge or erythema, pharynx normal Oral cavity: MMM, no lesions Neck: supple, no lymphadenopathy, no thyromegaly, no masses Heart: RRR, normal S1, S2, no murmurs Lungs: CTA bilaterally, no wheezes, rhonchi, or rales Abdomen: +bs, soft, non tender, non distended, no masses, no hepatomegaly, no splenomegaly Musculoskeletal: nontender, no swelling, no obvious deformity Extremities: no edema, no cyanosis, no clubbing Pulses: 2+ symmetric, upper and lower extremities, normal cap refill Neurological: alert, oriented x 3, CN2-12 intact, strength normal upper extremities and lower extremities, sensation normal throughout, DTRs 2+ throughout, no cerebellar signs, gait normal Psychiatric: normal affect, behavior normal, pleasant   Medicare Attestation I have personally reviewed: The patient's medical and social history Their use of alcohol, tobacco or illicit drugs Their current medications and supplements The patient's functional ability including ADLs,fall risks, home safety risks, cognitive, and hearing and visual impairment Diet and physical activities Evidence for depression or mood disorders  The patient's weight, height, BMI, and visual acuity have been recorded in the chart.  I have made referrals, counseling, and provided education to the patient based on review of the above and I have provided the patient with a written personalized care plan for preventive services.     Izora Ribas, NP   02/20/2018

## 2018-02-20 ENCOUNTER — Ambulatory Visit (INDEPENDENT_AMBULATORY_CARE_PROVIDER_SITE_OTHER): Payer: PPO | Admitting: Adult Health

## 2018-02-20 ENCOUNTER — Encounter: Payer: Self-pay | Admitting: Adult Health

## 2018-02-20 VITALS — BP 110/58 | HR 79 | Temp 97.3°F | Ht 71.5 in | Wt 152.0 lb

## 2018-02-20 DIAGNOSIS — Z860100 Personal history of colon polyps, unspecified: Secondary | ICD-10-CM

## 2018-02-20 DIAGNOSIS — I951 Orthostatic hypotension: Secondary | ICD-10-CM

## 2018-02-20 DIAGNOSIS — E1142 Type 2 diabetes mellitus with diabetic polyneuropathy: Secondary | ICD-10-CM

## 2018-02-20 DIAGNOSIS — Z79899 Other long term (current) drug therapy: Secondary | ICD-10-CM

## 2018-02-20 DIAGNOSIS — Z0000 Encounter for general adult medical examination without abnormal findings: Secondary | ICD-10-CM

## 2018-02-20 DIAGNOSIS — Z0001 Encounter for general adult medical examination with abnormal findings: Secondary | ICD-10-CM

## 2018-02-20 DIAGNOSIS — I10 Essential (primary) hypertension: Secondary | ICD-10-CM

## 2018-02-20 DIAGNOSIS — R6889 Other general symptoms and signs: Secondary | ICD-10-CM | POA: Diagnosis not present

## 2018-02-20 DIAGNOSIS — J449 Chronic obstructive pulmonary disease, unspecified: Secondary | ICD-10-CM

## 2018-02-20 DIAGNOSIS — E782 Mixed hyperlipidemia: Secondary | ICD-10-CM | POA: Diagnosis not present

## 2018-02-20 DIAGNOSIS — H6123 Impacted cerumen, bilateral: Secondary | ICD-10-CM

## 2018-02-20 DIAGNOSIS — N183 Chronic kidney disease, stage 3 (moderate): Secondary | ICD-10-CM | POA: Diagnosis not present

## 2018-02-20 DIAGNOSIS — K2100 Gastro-esophageal reflux disease with esophagitis, without bleeding: Secondary | ICD-10-CM

## 2018-02-20 DIAGNOSIS — Z8673 Personal history of transient ischemic attack (TIA), and cerebral infarction without residual deficits: Secondary | ICD-10-CM

## 2018-02-20 DIAGNOSIS — E559 Vitamin D deficiency, unspecified: Secondary | ICD-10-CM

## 2018-02-20 DIAGNOSIS — N1831 Chronic kidney disease, stage 3a: Secondary | ICD-10-CM

## 2018-02-20 DIAGNOSIS — G4700 Insomnia, unspecified: Secondary | ICD-10-CM

## 2018-02-20 DIAGNOSIS — K31819 Angiodysplasia of stomach and duodenum without bleeding: Secondary | ICD-10-CM

## 2018-02-20 DIAGNOSIS — I25812 Atherosclerosis of bypass graft of coronary artery of transplanted heart without angina pectoris: Secondary | ICD-10-CM

## 2018-02-20 DIAGNOSIS — Z6820 Body mass index (BMI) 20.0-20.9, adult: Secondary | ICD-10-CM

## 2018-02-20 LAB — COMPLETE METABOLIC PANEL WITH GFR
AG RATIO: 1.4 (calc) (ref 1.0–2.5)
ALBUMIN MSPROF: 3.9 g/dL (ref 3.6–5.1)
ALKALINE PHOSPHATASE (APISO): 54 U/L (ref 40–115)
ALT: 36 U/L (ref 9–46)
AST: 30 U/L (ref 10–35)
BILIRUBIN TOTAL: 0.7 mg/dL (ref 0.2–1.2)
BUN / CREAT RATIO: 11 (calc) (ref 6–22)
BUN: 17 mg/dL (ref 7–25)
CHLORIDE: 107 mmol/L (ref 98–110)
CO2: 27 mmol/L (ref 20–32)
CREATININE: 1.48 mg/dL — AB (ref 0.70–1.11)
Calcium: 9.3 mg/dL (ref 8.6–10.3)
GFR, Est African American: 51 mL/min/{1.73_m2} — ABNORMAL LOW (ref >=60)
GFR, Est Non African American: 44 mL/min/{1.73_m2} — ABNORMAL LOW (ref >=60)
GLOBULIN: 2.8 g/dL (ref 1.9–3.7)
Glucose, Bld: 90 mg/dL (ref 65–99)
POTASSIUM: 5.4 mmol/L — AB (ref 3.5–5.3)
SODIUM: 141 mmol/L (ref 135–146)
Total Protein: 6.7 g/dL (ref 6.1–8.1)

## 2018-02-20 LAB — CBC WITH DIFFERENTIAL/PLATELET
Basophils Absolute: 59 cells/uL (ref 0–200)
Basophils Relative: 1.3 %
EOS ABS: 90 {cells}/uL (ref 15–500)
Eosinophils Relative: 2 %
HEMATOCRIT: 42.5 % (ref 38.5–50.0)
HEMOGLOBIN: 14.9 g/dL (ref 13.2–17.1)
LYMPHS ABS: 1584 {cells}/uL (ref 850–3900)
MCH: 33 pg (ref 27.0–33.0)
MCHC: 35.1 g/dL (ref 32.0–36.0)
MCV: 94.2 fL (ref 80.0–100.0)
MPV: 10.3 fL (ref 7.5–12.5)
Monocytes Relative: 7.3 %
NEUTROS ABS: 2439 {cells}/uL (ref 1500–7800)
NEUTROS PCT: 54.2 %
Platelets: 208 10*3/uL (ref 140–400)
RBC: 4.51 10*6/uL (ref 4.20–5.80)
RDW: 13 % (ref 11.0–15.0)
Total Lymphocyte: 35.2 %
WBC mixed population: 329 cells/uL (ref 200–950)
WBC: 4.5 10*3/uL (ref 3.8–10.8)

## 2018-02-20 LAB — LIPID PANEL
CHOL/HDL RATIO: 2.7 (calc) (ref <5.0)
CHOLESTEROL: 167 mg/dL (ref <200)
HDL: 61 mg/dL (ref >40)
LDL Cholesterol (Calc): 87 mg/dL (calc)
Non-HDL Cholesterol (Calc): 106 mg/dL (calc) (ref <130)
Triglycerides: 91 mg/dL (ref <150)

## 2018-02-20 LAB — TSH: TSH: 2.09 mIU/L (ref 0.40–4.50)

## 2018-02-20 NOTE — Patient Instructions (Signed)
Aim for 7+ servings of fruits and vegetables daily  80+ fluid ounces of water or unsweet tea for healthy kidneys  1 drink of alcohol per day  Limit animal fats in diet for cholesterol and heart health - choose grass fed whenever available  Aim for low stress - take time to unwind and care for your mental health  Aim for 150 min of moderate intensity exercise weekly for heart health, and weights twice weekly for bone health  Aim for 7-9 hours of sleep daily  Here is some information to help you keep your heart healthy: Move it! - Aim for 30 mins of activity every day. Take it slowly at first. Talk to [place] before starting any new exercise program.   Lose it.  -Body Mass Index (BMI) can indicate if you need to lose weight. A healthy range is 18.5-24.9. For a BMI calculator, go to [email or web address]  Waist Management -Excess abdominal fat is a risk factor for heart disease, diabetes, asthma, stroke and more. Ideal waist circumference is less than 35" for women and less than 40" for men.   Eat Right -focus on fruits, vegetables, whole grains, and meals you make yourself. Avoid foods with trans fat and high sugar/sodium content.   Snooze or Snore? - Loud snoring can be a sign of sleep apnea, a significant risk factor for high blood pressure, heart attach, stroke, and heart arrhythmias.  Kick the habit -Quit Smoking! Avoid second hand smoke. A single cigarette raises your blood pressure for 20 mins and increases the risk of heart attack and stroke for the next 24 hours.   Are Aspirin and Supplements right for you? -Add ENTERIC COATED low dose 81 mg Aspirin daily OR can do every other day if you have easy bruising to protect your heart and head. As well as to reduce risk of Colon Cancer by 20 %, Skin Cancer by 26 % , Melanoma by 46% and Pancreatic cancer by 60%  Say "No to Stress -There may be little you can do about problems that cause stress. However, techniques such as long walks,  meditation, and exercise can help you manage it.   Start Now! - Make changes one at a time and set reasonable goals to increase your likelihood of success.

## 2018-02-21 ENCOUNTER — Other Ambulatory Visit: Payer: Self-pay | Admitting: Internal Medicine

## 2018-02-21 MED ORDER — ALPRAZOLAM 1 MG PO TABS: ORAL_TABLET | ORAL | 0 refills | Status: DC

## 2018-02-27 ENCOUNTER — Ambulatory Visit (INDEPENDENT_AMBULATORY_CARE_PROVIDER_SITE_OTHER): Payer: PPO | Admitting: Adult Health

## 2018-02-27 ENCOUNTER — Encounter: Payer: Self-pay | Admitting: Adult Health

## 2018-02-27 VITALS — BP 95/64 | HR 86 | Ht 71.5 in | Wt 152.2 lb

## 2018-02-27 DIAGNOSIS — G479 Sleep disorder, unspecified: Secondary | ICD-10-CM

## 2018-02-27 NOTE — Patient Instructions (Signed)
Your Plan:  Memory score is stable We will continue to monitor Monitor weight- if you continue to lose weight please consult with PCP If your symptoms worsen or you develop new symptoms please let us know.   Thank you for coming to see [place] at [facility]. I hope we have been able to provide you high quality care today.  You may receive a patient satisfaction survey over the next few weeks. We would appreciate your feedback and comments so that we may continue to improve ourselves and the health of our patients.

## 2018-02-27 NOTE — Progress Notes (Addendum)
PATIENT: John [name] DOB: 1934/11/04  REASON FOR VISIT: follow up HISTORY FROM: patient  HISTORY OF PRESENT ILLNESS: Today 02/27/18:  [name] is an 83 year old male with a history of memory disturbance.  He returns today for follow-up.  He feels that his memory has remained relatively stable.  He lives at home with his wife.  He is able to complete all ADLs.  He denies any trouble managing his finances.  He reports that he does not sleep as well as he used to but it has improved.  He denies any changes with mood or behavior.  Denies hallucinations.  He operates a Teacher, music without difficulty.  Denies any falls.  Denies any changes with his appetite.  Denies any trouble chewing or swallowing.  He does report that he lost weight since the last visit.  He reports that he is not trying to lose weight.  He returns today for evaluation.  HISTORY 08/30/2017(all dictated new, as well as above notes, some dictation done in note pad or Word, outside of chart, may appear as copied):  He reports feeling stable, no acute illness, no recent medication changes. Sleep is a little better lately. Memory is about the same, feels it has become a little worse over time. No recent falls but has fallen in the past. He feels off-balance especially when he first stands up, especially with turns. He had a heart monitor in the interim which showed PVCs and PACs. He has a routine follow-up with his cardiologist.  The patient's allergies, current medications, family history, past medical history, past social history, past surgical history and problem list were reviewed and updated as appropriate.     REVIEW OF SYSTEMS: Out of a complete 14 system review of symptoms, the patient complains only of the following symptoms, and all other reviewed systems are negative.  See HPI  ALLERGIES: Allergies  Allergen Reactions  . Gabapentin Other (See Comments)    Severe confusion  . Prozac [Fluoxetine  Hcl] Other (See Comments)    Patient state that it does not agree with him  . Soma [Carisoprodol] Other (See Comments)    Patient stated that it does not agree with him  . Beta Adrenergic Blockers Other (See Comments)    REACTION: Bradycardia    HOME MEDICATIONS: Outpatient Medications Prior to Visit  Medication Sig Dispense Refill  . ALPRAZolam (XANAX) 1 MG tablet Take 1/2 to 1 tablet 2 to 3 x/day ONLY if needed for Anxiety Attack and please try to limit to 5 days /week to avoid addiction 90 tablet 0  . aspirin 81 MG tablet Take 81 mg by mouth daily.      . Cholecalciferol (VITAMIN D3) 2000 UNITS capsule Take 8,000 Units by mouth daily.     . clopidogrel (PLAVIX) 75 MG tablet TAKE 1 TABLET BY MOUTH ONCE DAILY 90 tablet 1  . fludrocortisone (FLORINEF) 0.1 MG tablet Take 1 tablet (0.1 mg total) by mouth 2 (two) times daily. 180 tablet 1  . glucose blood (FREESTYLE LITE) test strip TEST once daily 100 each 1  . Lancets (FREESTYLE) lancets 1 each by Other route as needed for other. Use as instructed    . loperamide (IMODIUM) 2 MG capsule Take 4 mg by mouth as needed for diarrhea or loose stools.     . Melatonin 5 MG CAPS Take 10 mg by mouth at bedtime.     . nitroGLYCERIN (NITROSTAT) 0.4 MG SL tablet Sig: 1 tablet under tongue every  3 to 5 minutes as needed for Angina - Please Dispense # 2 bottles of # 25 tabs 50 tablet 99  . pantoprazole (PROTONIX) 40 MG tablet TAKE ONE TABLET BY MOUTH ONCE DAILY 90 tablet 3  . potassium citrate (UROCIT-K) 10 MEQ (1080 MG) SR tablet TAKE ONE TABLET BY MOUTH TWICE DAILY 180 tablet 1  . prochlorperazine (COMPAZINE) 5 MG tablet TAKE 1-2 TABS THREE TIMES A DAY AS NEEDED FOR NAUSEA 50 tablet 0  . QUEtiapine (SEROQUEL) 25 MG tablet TAKE 1 TABLET BY MOUTH THREE TIMES DAILY 270 tablet 0  . simvastatin (ZOCOR) 80 MG tablet Take 80 mg by mouth daily. Patient takes 40 mg by mouth on Monday, Wednesday, Friday.     No facility-administered medications prior to visit.      PAST MEDICAL HISTORY: Past Medical History:  Diagnosis Date  . Anxiety   . Arthritis    "lower back; left ankle" (03/20/2014)  . ASTHMA   . Blood transfusion    "related to OHS, ulcers"  . CAD (coronary artery disease)    a. s/p 2V CABG 1996. b. Last cath 12/2016 -> patent grafts, med rx.  . CAD, ARTERY BYPASS GRAFT   . CKD (chronic kidney disease), stage II   . Colon polyps    adenomatous polyps  . COPD   . Diverticulosis   . Duodenal ulcer without hemorrhage or perforation 12/29/2011  . GASTROESOPHAGEAL REFLUX DISEASE   . HYPERLIPIDEMIA   . Insomnia   . Leukodystrophy   . Memory loss   . Nephrolithiasis    "I've had over 320 kidney stones" (03/20/2014)  . NEPHROLITHIASIS, HX OF 03/12/2009  . Parkinsonism ([place])   . Sleep apnea    "don't always wear it" (03/20/2014)  . SUPRAVENTRICULAR TACHYCARDIA, HX OF   . SVT (supraventricular tachycardia) ([place]) 03/12/2009  . TIA   . Ulcer     PAST SURGICAL HISTORY: Past Surgical History:  Procedure Laterality Date  . ANKLE FRACTURE SURGERY Left ~ 2012  . CARDIAC CATHETERIZATION    . COLONOSCOPY    . CORONARY ARTERY BYPASS GRAFT  1996   CABG X2  . CYSTOSCOPY WITH URETEROSCOPY, STONE BASKETRY AND STENT PLACEMENT    . Cystourethroscopy with stent removal.    . EXCISIONAL HEMORRHOIDECTOMY    . GASTRECTOMY    . HIATAL HERNIA REPAIR    . IRRIGATION AND DEBRIDEMENT SEBACEOUS CYST     "off my back  . LEFT HEART CATH AND CORS/GRAFTS ANGIOGRAPHY N/A 12/23/2016   Procedure: Left Heart Cath and Cors/Grafts Angiography;  Surgeon: Burnell Blanks, MD;  Location: [facility];  Service: Cardiovascular;  Laterality: N/A;  . POLYPECTOMY    . PROSTATE SURGERY     "took the center of my prostate out"  . UPPER GASTROINTESTINAL ENDOSCOPY    . VAGOTOMY      FAMILY HISTORY: Family History  Problem Relation Age of Onset  . Alcohol abuse Father   . Stroke Mother   . Heart disease Brother   . Diabetes Brother   . Heart disease  Paternal Grandmother   . Colon cancer Neg Hx   . Stomach cancer Neg Hx     SOCIAL HISTORY: Social History   Socioeconomic History  . Marital status: Married    Spouse name: Not on file  . Number of children: 2  . Years of education: 65  . Highest education level: Not on file  Occupational History  . Occupation: Programme researcher, broadcasting/film/video at [employer]  Employer: RETIRED  Social Needs  . Financial resource strain: Not on file  . Food insecurity:    Worry: Not on file    Inability: Not on file  . Transportation needs:    Medical: Not on file    Non-medical: Not on file  Tobacco Use  . Smoking status: Former Smoker    Packs/day: 0.50    Types: Cigarettes    Last attempt to quit: 10/25/1962    Years since quitting: 55.3  . Smokeless tobacco: Current User    Types: Chew  Substance and Sexual Activity  . Alcohol use: No    Alcohol/week: 0.0 oz  . Drug use: No  . Sexual activity: Yes  Lifestyle  . Physical activity:    Days per week: Not on file    Minutes per session: Not on file  . Stress: Not on file  Relationships  . Social connections:    Talks on phone: Not on file    Gets together: Not on file    Attends religious service: Not on file    Active member of club or organization: Not on file    Attends meetings of clubs or organizations: Not on file    Relationship status: Not on file  . Intimate partner violence:    Fear of current or ex partner: Not on file    Emotionally abused: Not on file    Physically abused: Not on file    Forced sexual activity: Not on file  Other Topics Concern  . Not on file  Social History Narrative   Denies caffeine use       PHYSICAL EXAM  Vitals:   02/27/18 1312  BP: 95/64  Pulse: 86  Weight: 152 lb 3.2 oz (69 kg)  Height: 5' 11.5" (1.816 m)   Body mass index is 20.93 kg/m.   MMSE - Mini Mental State Exam 02/27/2018 02/23/2017 08/26/2016  Orientation to time 5 5 5   Orientation to Place 5 5 4   Registration 2 3 3     Attention/ Calculation 5 5 5   Recall 3 2 2   Language- name 2 objects 2 2 2   Language- repeat 1 1 1   Language- follow 3 step command 3 3 3   Language- read & follow direction 1 1 1   Write a sentence 1 1 1   Copy design 0 1 1  Total score 28 29 28     Generalized: Well developed, in no acute distress   Neurological examination  Mentation: Alert oriented to time, place, history taking. Follows all commands speech and language fluent Cranial nerve II-XII: Pupils were equal round reactive to light. Extraocular movements were full, visual field were full on confrontational test. Facial sensation and strength were normal. Uvula tongue midline. Head turning and shoulder shrug  were normal and symmetric. Motor: The motor testing reveals 5 over 5 strength of all 4 extremities. Good symmetric motor tone is noted throughout.  Sensory: Sensory testing is intact to soft touch on all 4 extremities. No evidence of extinction is noted.  Coordination: Cerebellar testing reveals good finger-nose-finger and heel-to-shin bilaterally.  Gait and station: Gait is normal. Tandem gait is slightly unsteady. Romberg is negative. No drift is seen.  Reflexes: Deep tendon reflexes are symmetric and normal bilaterally.   DIAGNOSTIC DATA (LABS, IMAGING, TESTING) - I reviewed patient records, labs, notes, testing and imaging myself where available.  Lab Results  Component Value Date   WBC 4.5 02/20/2018   HGB 14.9 02/20/2018   HCT 42.5 02/20/2018  MCV 94.2 02/20/2018   PLT 208 02/20/2018      Component Value Date/Time   NA 141 02/20/2018 1530   NA 144 12/15/2016 1705   K 5.4 (H) 02/20/2018 1530   CL 107 02/20/2018 1530   CO2 27 02/20/2018 1530   GLUCOSE 90 02/20/2018 1530   BUN 17 02/20/2018 1530   BUN 21 12/15/2016 1705   CREATININE 1.48 (H) 02/20/2018 1530   CALCIUM 9.3 02/20/2018 1530   PROT 6.7 02/20/2018 1530   ALBUMIN 3.8 05/03/2017 1520   AST 30 02/20/2018 1530   ALT 36 02/20/2018 1530    ALKPHOS 47 05/03/2017 1520   BILITOT 0.7 02/20/2018 1530   GFRNONAA 44 (L) 02/20/2018 1530   GFRAA 51 (L) 02/20/2018 1530   Lab Results  Component Value Date   CHOL 167 02/20/2018   HDL 61 02/20/2018   LDLCALC 87 02/20/2018   TRIG 91 02/20/2018   CHOLHDL 2.7 02/20/2018   Lab Results  Component Value Date   HGBA1C 5.2 11/21/2017   Lab Results  Component Value Date   VITAMINB12 408 09/10/2015   Lab Results  Component Value Date   TSH 2.09 02/20/2018      ASSESSMENT AND PLAN 83 y.o. year old male  has a past medical history of Anxiety, Arthritis, ASTHMA, Blood transfusion, CAD (coronary artery disease), CAD, ARTERY BYPASS GRAFT, CKD (chronic kidney disease), stage II, Colon polyps, COPD, Diverticulosis, Duodenal ulcer without hemorrhage or perforation (12/29/2011), GASTROESOPHAGEAL REFLUX DISEASE, HYPERLIPIDEMIA, Insomnia, Leukodystrophy, Memory loss, Nephrolithiasis, NEPHROLITHIASIS, HX OF (03/12/2009), Parkinsonism ([place]), Sleep apnea, SUPRAVENTRICULAR TACHYCARDIA, HX OF, SVT (supraventricular tachycardia) ([place]) (03/12/2009), TIA, and Ulcer. here with:  1.  Memory disturbance  The patient's memory score has remained stable.  We will continue to monitor.  The patient is not currently on any memory medication at this time.  He is advised that if his symptoms worsen or he develops new symptoms he should let us know.  He will follow-up in 6 months or sooner if needed.  I spent 15 minutes with the patient. 50% of this time was spent discussing his memory score   Ward Givens, MSN, NP-C 02/27/2018, 1:39 PM [facility] [address] [place], [place] [address] [phone]  I reviewed the above note and documentation by the Nurse Practitioner and agree with the history, physical exam, assessment and plan as outlined above. I was immediately available for face-to-face consultation. Star Age, MD, PhD [facility])

## 2018-03-21 ENCOUNTER — Other Ambulatory Visit: Payer: Self-pay | Admitting: Internal Medicine

## 2018-03-21 DIAGNOSIS — E785 Hyperlipidemia, unspecified: Secondary | ICD-10-CM

## 2018-05-18 ENCOUNTER — Other Ambulatory Visit: Payer: Self-pay | Admitting: Internal Medicine

## 2018-05-18 DIAGNOSIS — F419 Anxiety disorder, unspecified: Secondary | ICD-10-CM

## 2018-05-18 MED ORDER — ALPRAZOLAM 1 MG PO TABS: ORAL_TABLET | ORAL | 0 refills | Status: DC

## 2018-05-24 ENCOUNTER — Ambulatory Visit (INDEPENDENT_AMBULATORY_CARE_PROVIDER_SITE_OTHER): Payer: PPO | Admitting: Internal Medicine

## 2018-05-24 VITALS — BP 112/68 | HR 80 | Temp 97.8°F | Resp 16 | Ht 71.5 in | Wt 147.0 lb

## 2018-05-24 DIAGNOSIS — E782 Mixed hyperlipidemia: Secondary | ICD-10-CM

## 2018-05-24 DIAGNOSIS — E559 Vitamin D deficiency, unspecified: Secondary | ICD-10-CM | POA: Diagnosis not present

## 2018-05-24 DIAGNOSIS — I25810 Atherosclerosis of coronary artery bypass graft(s) without angina pectoris: Secondary | ICD-10-CM

## 2018-05-24 DIAGNOSIS — R634 Abnormal weight loss: Secondary | ICD-10-CM

## 2018-05-24 DIAGNOSIS — N183 Chronic kidney disease, stage 3 (moderate): Secondary | ICD-10-CM | POA: Diagnosis not present

## 2018-05-24 DIAGNOSIS — I10 Essential (primary) hypertension: Secondary | ICD-10-CM

## 2018-05-24 DIAGNOSIS — Z79899 Other long term (current) drug therapy: Secondary | ICD-10-CM | POA: Diagnosis not present

## 2018-05-24 DIAGNOSIS — E1122 Type 2 diabetes mellitus with diabetic chronic kidney disease: Secondary | ICD-10-CM | POA: Diagnosis not present

## 2018-05-24 DIAGNOSIS — I951 Orthostatic hypotension: Secondary | ICD-10-CM | POA: Diagnosis not present

## 2018-05-24 MED ORDER — MIRTAZAPINE 15 MG PO TABS: ORAL_TABLET | ORAL | 2 refills | Status: DC

## 2018-05-24 NOTE — Progress Notes (Signed)
This very nice 83 y.o.MWM presents for 6 month follow up with HTN, ASHD/CABD, HLD, T2_DM and Vitamin D Deficiency. Patient is followed by Dr Rexene Alberts for mild Dementia. Patient also has hx/o multiple Kidney stones (>200+).     Patient has lost weight  (5#- last 3 months & 15# - last year from 162#/july2018 to current 147#)  with only sx is loss of appetite.      Patient is treated for HTN (1960's)  & BP has been controlled at home. Today's BP is at goal - 112/68. Patient has hx/o Labile HTN, monitors daily standing BP's  for postural Hypotension. Currently , he is on no Antihypertensives & takes 1-2 Florinef/day to avoid postural Hypotension.  In 1996, he underwent 2V CABG & has done well since. Patient has had no complaints of any cardiac type chest pain, palpitations, dyspnea / orthopnea / PND, dizziness, claudication, or dependent edema.     Hyperlipidemia is controlled with diet & meds. Patient denies myalgias or other med SE's. Last Lipids were at goal: Lab Results  Component Value Date   CHOL 155 05/24/2018   HDL 59 05/24/2018   LDLCALC 77 05/24/2018   TRIG 105 05/24/2018   CHOLHDL 2.6 05/24/2018      Also, the patient has history of T2_NIDDM (1995)  And with weight loss was able to wean off of DM meds by 2008. He has had no symptoms of reactive hypoglycemia, diabetic polys or visual blurring.  He does have Diabetic peripheral neuropathy and autonomic insufficiency . Last A1c was at goal: Lab Results  Component Value Date   HGBA1C 5.4 05/24/2018      Further, the patient also has history of Vitamin D Deficiency ("18"/2008) and supplements vitamin D without any suspected side-effects. Last vitamin D was at goal: Lab Results  Component Value Date   VD25OH 48 05/24/2018   Current Outpatient Medications on File Prior to Visit  Medication Sig  . ALPRAZolam (XANAX) 1 MG tablet Take 1/2 to 1 tablet 2 to 3 x/day ONLY if needed for Anxiety Attack and please try to limit to 5 days /week to  avoid addiction  . aspirin 81 MG tablet Take 81 mg by mouth daily.    . Cholecalciferol (VITAMIN D3) 2000 UNITS capsule Take 8,000 Units by mouth daily.   . clopidogrel (PLAVIX) 75 MG tablet TAKE 1 TABLET BY MOUTH ONCE DAILY  . fludrocortisone (FLORINEF) 0.1 MG tablet Take 1 tablet (0.1 mg total) by mouth 2 (two) times daily.  [name] glucose blood (FREESTYLE LITE) test strip TEST once daily  . Lancets (FREESTYLE) lancets 1 each by Other route as needed for other. Use as instructed  . loperamide (IMODIUM) 2 MG capsule Take 4 mg by mouth as needed for diarrhea or loose stools.   . Melatonin 5 MG CAPS Take 10 mg by mouth at bedtime.   . nitroGLYCERIN (NITROSTAT) 0.4 MG SL tablet Sig: 1 tablet under tongue every 3 to 5 minutes as needed for Angina - Please Dispense # 2 bottles of # 25 tabs  . pantoprazole (PROTONIX) 40 MG tablet TAKE ONE TABLET BY MOUTH ONCE DAILY  . potassium citrate (UROCIT-K) 10 MEQ (1080 MG) SR tablet TAKE ONE TABLET BY MOUTH TWICE DAILY  . prochlorperazine (COMPAZINE) 5 MG tablet TAKE 1-2 TABS THREE TIMES A DAY AS NEEDED FOR NAUSEA  . QUEtiapine (SEROQUEL) 25 MG tablet TAKE 1 TABLET BY MOUTH THREE TIMES DAILY  . simvastatin (ZOCOR) 80 MG tablet TAKE  ONE TABLET BY MOUTH AT BEDTIME   No current facility-administered medications on file prior to visit.    Allergies  Allergen Reactions  . Gabapentin Other (See Comments)    Severe confusion  . Prozac [Fluoxetine Hcl] Other (See Comments)    Patient state that it does not agree with him  . Soma [Carisoprodol] Other (See Comments)    Patient stated that it does not agree with him  . Beta Adrenergic Blockers Other (See Comments)    REACTION: Bradycardia   PMHx:   Past Medical History:  Diagnosis Date  . Anxiety   . Arthritis    "lower back; left ankle" (03/20/2014)  . ASTHMA   . Blood transfusion    "related to OHS, ulcers"  . CAD (coronary artery disease)    a. s/p 2V CABG 1996. b. Last cath 12/2016 -> patent grafts, med  rx.  . CAD, ARTERY BYPASS GRAFT   . CKD (chronic kidney disease), stage II   . Colon polyps    adenomatous polyps  . COPD   . Diverticulosis   . Duodenal ulcer without hemorrhage or perforation 12/29/2011  . GASTROESOPHAGEAL REFLUX DISEASE   . HYPERLIPIDEMIA   . Insomnia   . Leukodystrophy   . Memory loss   . Nephrolithiasis    "I've had over 320 kidney stones" (03/20/2014)  . NEPHROLITHIASIS, HX OF 03/12/2009  . Parkinsonism ([place])   . Sleep apnea    "don't always wear it" (03/20/2014)  . SUPRAVENTRICULAR TACHYCARDIA, HX OF   . SVT (supraventricular tachycardia) ([place]) 03/12/2009  . TIA   . Ulcer    Immunization History  Administered Date(s) Administered  . Influenza, High Dose Seasonal PF 08/13/2013, 08/05/2014, 07/23/2015, 06/22/2016, 07/18/2017  . Pneumococcal Conjugate-13 08/27/2014  . Pneumococcal Polysaccharide-23 08/06/2012  . Tdap 08/01/2013  . Zoster 05/02/2006   Past Surgical History:  Procedure Laterality Date  . ANKLE FRACTURE SURGERY Left ~ 2012  . CARDIAC CATHETERIZATION    . COLONOSCOPY    . CORONARY ARTERY BYPASS GRAFT  1996   CABG X2  . CYSTOSCOPY WITH URETEROSCOPY, STONE BASKETRY AND STENT PLACEMENT    . Cystourethroscopy with stent removal.    . EXCISIONAL HEMORRHOIDECTOMY    . GASTRECTOMY    . HIATAL HERNIA REPAIR    . IRRIGATION AND DEBRIDEMENT SEBACEOUS CYST     "off my back  . LEFT HEART CATH AND CORS/GRAFTS ANGIOGRAPHY N/A 12/23/2016   Procedure: Left Heart Cath and Cors/Grafts Angiography;  Surgeon: Burnell Blanks, MD;  Location: [facility];  Service: Cardiovascular;  Laterality: N/A;  . POLYPECTOMY    . PROSTATE SURGERY     "took the center of my prostate out"  . UPPER GASTROINTESTINAL ENDOSCOPY    . VAGOTOMY     FHx:    Reviewed / unchanged  SHx:    Reviewed / unchanged   Systems Review:  Constitutional: Denies fever, chills, wt changes, headaches, insomnia, fatigue, night sweats, change in appetite. Eyes: Denies redness,  blurred vision, diplopia, discharge, itchy, watery eyes.  ENT: Denies discharge, congestion, post nasal drip, epistaxis, sore throat, earache, hearing loss, dental pain, tinnitus, vertigo, sinus pain, snoring.  CV: Denies chest pain, palpitations, irregular heartbeat, syncope, dyspnea, diaphoresis, orthopnea, PND, claudication or edema. Respiratory: denies cough, dyspnea, DOE, pleurisy, hoarseness, laryngitis, wheezing.  Gastrointestinal: Denies dysphagia, odynophagia, heartburn, reflux, water brash, abdominal pain or cramps, nausea, vomiting, bloating, diarrhea, constipation, hematemesis, melena, hematochezia  or hemorrhoids. Genitourinary: Denies dysuria, frequency, urgency, nocturia, hesitancy, discharge, hematuria  or flank pain. Musculoskeletal: Denies arthralgias, myalgias, stiffness, jt. swelling, pain, limping or strain/sprain.  Skin: Denies pruritus, rash, hives, warts, acne, eczema or change in skin lesion(s). Neuro: No weakness, tremor, incoordination, spasms, paresthesia or pain. Psychiatric: Denies confusion, memory loss or sensory loss. Endo: Denies change in weight, skin or hair change.  Heme/Lymph: No excessive bleeding, bruising or enlarged lymph nodes.  Physical Exam  BP 112/68   Pulse 80   Temp 97.8 F (36.6 C)   Resp 16   Ht 5' 11.5" (1.816 m)   Wt 147 lb (66.7 kg)   BMI 20.22 kg/m   Appears  well nourished, well groomed  and in no distress.  Eyes: PERRLA, EOMs, conjunctiva no swelling or erythema. Sinuses: No frontal/maxillary tenderness ENT/Mouth: EAC's clear, TM's nl w/o erythema, bulging. Nares clear w/o erythema, swelling, exudates. Oropharynx clear without erythema or exudates. Oral hygiene is good. Tongue normal, non obstructing. Hearing intact.  Neck: Supple. Thyroid not palpable. Car 2+/2+ without bruits, nodes or JVD. Chest: Respirations nl with BS clear & equal w/o rales, rhonchi, wheezing or stridor.  Cor: Heart sounds normal w/ regular rate and rhythm  without sig. murmurs, gallops, clicks or rubs. Peripheral pulses normal and equal  without edema.  Abdomen: Soft & bowel sounds normal. Non-tender w/o guarding, rebound, hernias, masses or organomegaly.  Lymphatics: Unremarkable.  Musculoskeletal: Full ROM all peripheral extremities, joint stability, 5/5 strength and normal gait.  Skin: Warm, dry without exposed rashes, lesions or ecchymosis apparent.  Neuro: Cranial nerves intact, reflexes equal bilaterally. Sensation decreased in a stocking distribution to touch, vibratory and Monofilament to the toes bilaterally. Motor testing grossly intact. Tendon reflexes grossly intact 1+/1+.  Pysch: Alert & oriented x 3.  Insight and judgement nl & appropriate. No ideations.  Assessment and Plan:  1. Essential hypertension  - Continue medication, monitor blood pressure at home.  - Continue DASH diet.  Reminder to go to the ER if any CP,  SOB, nausea, dizziness, severe HA, changes vision/speech.  - CBC with Differential/Platelet - COMPLETE METABOLIC PANEL WITH GFR - Magnesium - TSH  2. Hyperlipidemia, mixed  - Continue diet/meds, exercise,& lifestyle modifications.  - Continue monitor periodic cholesterol/liver & renal functions   - Lipid panel - TSH  3. Type 2 diabetes mellitus with stage 3 chronic kidney disease, without long-term current use of insulin (HCC)  - Continue diet, exercise, lifestyle modifications.  - Monitor appropriate labs.  - Hemoglobin A1c - Insulin, random  4. Vitamin D deficiency  - Continue supplementation.  - VITAMIN D 25 Hydroxyl  5. 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 Autonomic postural hypotension  7. Medication management  - CBC with Differential/Platelet - COMPLETE METABOLIC PANEL WITH GFR - Magnesium - Lipid panel - TSH - Hemoglobin A1c - Insulin, random - VITAMIN D 25 Hydroxyl  8. Weight loss  - mirtazapine  (REMERON) 15 MG tablet; Take 1/2 to 1 tablet 1 hour before sleep for appetite  Dispense: 30 tablet; Refill: 2       Discussed  regular exercise, BP monitoring, weight control to achieve/maintain BMI less than 25 and discussed med and SE's. Recommended labs to assess and monitor clinical status with further disposition pending results of labs. Over 30 minutes of exam, counseling, chart review was performed.

## 2018-05-24 NOTE — Patient Instructions (Signed)

## 2018-05-25 LAB — COMPLETE METABOLIC PANEL WITH GFR
AG Ratio: 1.5 (calc) (ref 1.0–2.5)
ALKALINE PHOSPHATASE (APISO): 66 U/L (ref 40–115)
ALT: 19 U/L (ref 9–46)
AST: 14 U/L (ref 10–35)
Albumin: 4 g/dL (ref 3.6–5.1)
BUN/Creatinine Ratio: 10 (calc) (ref 6–22)
BUN: 15 mg/dL (ref 7–25)
CHLORIDE: 112 mmol/L — AB (ref 98–110)
CO2: 30 mmol/L (ref 20–32)
Calcium: 9.5 mg/dL (ref 8.6–10.3)
Creat: 1.51 mg/dL — ABNORMAL HIGH (ref 0.70–1.11)
GFR, EST NON AFRICAN AMERICAN: 43 mL/min/{1.73_m2} — AB (ref >=60)
GFR, Est African American: 49 mL/min/{1.73_m2} — ABNORMAL LOW (ref >=60)
GLOBULIN: 2.7 g/dL (ref 1.9–3.7)
Glucose, Bld: 109 mg/dL — ABNORMAL HIGH (ref 65–99)
POTASSIUM: 5.1 mmol/L (ref 3.5–5.3)
SODIUM: 150 mmol/L — AB (ref 135–146)
Total Bilirubin: 0.6 mg/dL (ref 0.2–1.2)
Total Protein: 6.7 g/dL (ref 6.1–8.1)

## 2018-05-25 LAB — TSH: TSH: 1.51 mIU/L (ref 0.40–4.50)

## 2018-05-25 LAB — LIPID PANEL
CHOLESTEROL: 155 mg/dL (ref <200)
HDL: 59 mg/dL (ref >40)
LDL Cholesterol (Calc): 77 mg/dL (calc)
Non-HDL Cholesterol (Calc): 96 mg/dL (calc) (ref <130)
Total CHOL/HDL Ratio: 2.6 (calc) (ref <5.0)
Triglycerides: 105 mg/dL (ref <150)

## 2018-05-25 LAB — CBC WITH DIFFERENTIAL/PLATELET
Basophils Absolute: 48 cells/uL (ref 0–200)
Basophils Relative: 1 %
Eosinophils Absolute: 192 cells/uL (ref 15–500)
Eosinophils Relative: 4 %
HCT: 41.2 % (ref 38.5–50.0)
Hemoglobin: 13.9 g/dL (ref 13.2–17.1)
Lymphs Abs: 1675 cells/uL (ref 850–3900)
MCH: 32.6 pg (ref 27.0–33.0)
MCHC: 33.7 g/dL (ref 32.0–36.0)
MCV: 96.7 fL (ref 80.0–100.0)
MPV: 10.3 fL (ref 7.5–12.5)
Monocytes Relative: 9.6 %
NEUTROS ABS: 2424 {cells}/uL (ref 1500–7800)
Neutrophils Relative %: 50.5 %
Platelets: 207 10*3/uL (ref 140–400)
RBC: 4.26 10*6/uL (ref 4.20–5.80)
RDW: 12.3 % (ref 11.0–15.0)
Total Lymphocyte: 34.9 %
WBC mixed population: 461 cells/uL (ref 200–950)
WBC: 4.8 10*3/uL (ref 3.8–10.8)

## 2018-05-25 LAB — VITAMIN D 25 HYDROXY (VIT D DEFICIENCY, FRACTURES): Vit D, 25-Hydroxy: 65 ng/mL (ref 30–100)

## 2018-05-25 LAB — HEMOGLOBIN A1C
EAG (MMOL/L): 6 (calc)
Hgb A1c MFr Bld: 5.4 % of total Hgb (ref <5.7)
MEAN PLASMA GLUCOSE: 108 (calc)

## 2018-05-25 LAB — INSULIN, RANDOM

## 2018-05-25 LAB — MAGNESIUM: MAGNESIUM: 2.2 mg/dL (ref 1.5–2.5)

## 2018-05-28 ENCOUNTER — Encounter: Payer: Self-pay | Admitting: Internal Medicine

## 2018-06-05 ENCOUNTER — Other Ambulatory Visit: Payer: Self-pay | Admitting: Internal Medicine

## 2018-06-05 DIAGNOSIS — I2510 Atherosclerotic heart disease of native coronary artery without angina pectoris: Secondary | ICD-10-CM

## 2018-06-07 ENCOUNTER — Other Ambulatory Visit: Payer: Self-pay | Admitting: Internal Medicine

## 2018-06-22 ENCOUNTER — Encounter: Payer: Self-pay | Admitting: Internal Medicine

## 2018-06-22 ENCOUNTER — Ambulatory Visit (INDEPENDENT_AMBULATORY_CARE_PROVIDER_SITE_OTHER): Payer: PPO | Admitting: Internal Medicine

## 2018-06-22 VITALS — BP 118/68 | HR 83 | Temp 99.2°F | Resp 12 | Ht 71.5 in | Wt 149.2 lb

## 2018-06-22 DIAGNOSIS — I951 Orthostatic hypotension: Secondary | ICD-10-CM | POA: Diagnosis not present

## 2018-06-22 DIAGNOSIS — Z79899 Other long term (current) drug therapy: Secondary | ICD-10-CM | POA: Diagnosis not present

## 2018-06-22 DIAGNOSIS — R634 Abnormal weight loss: Secondary | ICD-10-CM

## 2018-06-22 DIAGNOSIS — E870 Hyperosmolality and hypernatremia: Secondary | ICD-10-CM | POA: Diagnosis not present

## 2018-06-22 DIAGNOSIS — E2740 Unspecified adrenocortical insufficiency: Secondary | ICD-10-CM

## 2018-06-22 NOTE — Progress Notes (Signed)
Subjective:    Patient ID: John [name], male    DOB: [birth year], 83 y.o.   MRN: [id number]  HPI     This very  83 y.o.MWM returns for follow up with labile HTN/Hypotension, ASHD/CABD, HLD, T2_DM and Vitamin D Deficiency. Patient is on Florinef to support his Postural /orthoststic Hypotension. Because of concerns re: weight loss at last OV he was started on Remeron and weight is up 2 # to 149#. It's speculated that he may has wasting from adrenal insufficiency suppressed by the Florinef.  Medication Sig  . ALPRAZolam (XANAX) 1 MG tablet Take 1/2 to 1 tablet 2 to 3 x/day ONLY if needed for Anxiety Attack and please try to limit to 5 days /week to avoid addiction  . aspirin 81 MG tablet Take 81 mg by mouth daily.    . Cholecalciferol (VITAMIN D3) 2000 UNITS capsule Take 8,000 Units by mouth daily.   . clopidogrel (PLAVIX) 75 MG tablet TAKE 1 TABLET BY MOUTH ONCE DAILY  . fludrocortisone (FLORINEF) 0.1 MG tablet Take 1 tablet (0.1 mg total) by mouth 2 (two) times daily.  [name] glucose blood (FREESTYLE LITE) test strip TEST BLOOD SUGAR ONCE DAILY. Dx-E11.22.  [name] Lancets (FREESTYLE) lancets 1 each by Other route as needed for other. Use as instructed  . loperamide (IMODIUM) 2 MG capsule Take 4 mg by mouth as needed for diarrhea or loose stools.   . Melatonin 5 MG CAPS Take 10 mg by mouth at bedtime.   . mirtazapine (REMERON) 15 MG tablet Take 1/2 to 1 tablet 1 hour before sleep for appetite  . nitroGLYCERIN (NITROSTAT) 0.4 MG SL tablet Sig: 1 tablet under tongue every 3 to 5 minutes as needed for Angina - Please Dispense # 2 bottles of # 25 tabs  . pantoprazole (PROTONIX) 40 MG tablet TAKE ONE TABLET BY MOUTH ONCE DAILY  . potassium citrate (UROCIT-K) 10 MEQ (1080 MG) SR tablet TAKE ONE TABLET BY MOUTH TWICE DAILY  . prochlorperazine (COMPAZINE) 5 MG tablet TAKE 1-2 TABS THREE TIMES A DAY AS NEEDED FOR NAUSEA  . QUEtiapine (SEROQUEL) 25 MG tablet TAKE 1 TABLET BY MOUTH THREE TIMES DAILY  .  simvastatin (ZOCOR) 80 MG tablet TAKE ONE TABLET BY MOUTH AT BEDTIME   No facility-administered medications prior to visit.    Allergies  Allergen Reactions  . Gabapentin Other (See Comments)    Severe confusion  . Prozac [Fluoxetine Hcl] Other (See Comments)    Patient state that it does not agree with him  . Soma [Carisoprodol] Other (See Comments)    Patient stated that it does not agree with him  . Beta Adrenergic Blockers Other (See Comments)    REACTION: Bradycardia   Past Medical History:  Diagnosis Date  . Anxiety   . Arthritis    "lower back; left ankle" (03/20/2014)  . ASTHMA   . Blood transfusion    "related to OHS, ulcers"  . CAD (coronary artery disease)    a. s/p 2V CABG 1996. b. Last cath 12/2016 -> patent grafts, med rx.  . CAD, ARTERY BYPASS GRAFT   . CKD (chronic kidney disease), stage II   . Colon polyps    adenomatous polyps  . COPD   . Diverticulosis   . Duodenal ulcer without hemorrhage or perforation 12/29/2011  . GASTROESOPHAGEAL REFLUX DISEASE   . HYPERLIPIDEMIA   . Insomnia   . Leukodystrophy   . Memory loss   . Nephrolithiasis    "I've had over  320 kidney stones" (03/20/2014)  . NEPHROLITHIASIS, HX OF 03/12/2009  . Parkinsonism ([place])   . Sleep apnea    "don't always wear it" (03/20/2014)  . SUPRAVENTRICULAR TACHYCARDIA, HX OF   . SVT (supraventricular tachycardia) ([place]) 03/12/2009  . TIA   . Ulcer    Past Surgical History:  Procedure Laterality Date  . ANKLE FRACTURE SURGERY Left ~ 2012  . CARDIAC CATHETERIZATION    . COLONOSCOPY    . CORONARY ARTERY BYPASS GRAFT  1996   CABG X2  . CYSTOSCOPY WITH URETEROSCOPY, STONE BASKETRY AND STENT PLACEMENT    . Cystourethroscopy with stent removal.    . EXCISIONAL HEMORRHOIDECTOMY    . GASTRECTOMY    . HIATAL HERNIA REPAIR    . IRRIGATION AND DEBRIDEMENT SEBACEOUS CYST     "off my back  . LEFT HEART CATH AND CORS/GRAFTS ANGIOGRAPHY N/A 12/23/2016   Procedure: Left Heart Cath and Cors/Grafts  Angiography;  Surgeon: Burnell Blanks, MD;  Location: [facility];  Service: Cardiovascular;  Laterality: N/A;  . POLYPECTOMY    . PROSTATE SURGERY     "took the center of my prostate out"  . UPPER GASTROINTESTINAL ENDOSCOPY    . VAGOTOMY       Review of Systems     Objective:   Physical Exam  BP 118/68   Pulse 83   Temp 99.2 F (37.3 C)   Resp 12   Ht 5' 11.5" (1.816 m)   Wt 149 lb 3.2 oz (67.7 kg)   SpO2 97%   BMI 20.52 kg/m   Postural Sitting BP    120/74   P 71      &      Standing BP   116/76    P 83  HEENT - WNL. Neck - supple.  Chest - Clear equal BS. Cor - Nl HS. RRR w/o sig MGR. PP 1(+). No edema. MS- FROM w/o deformities.  Gait Nl. Neuro -  Nl w/o focal abnormalities.          Assessment & Plan:   1. Autonomic postural hypotension  - CBC with Differential/Platelet - COMPLETE METABOLIC PANEL WITH GFR - Cortisol - ACTH  2. Weight loss  - COMPLETE METABOLIC PANEL WITH GFR - Cortisol - ACTH  3. Adrenal insufficiency ([place]) - suspected   - will try low dose prednisone empirically  - Cortisol - ACTH  4. Hypernatremia  - COMPLETE METABOLIC PANEL WITH GFR - Cortisol - ACTH  5. Medication management  - CBC with Differential/Platelet - COMPLETE METABOLIC PANEL WITH GFR - Cortisol - ACTH

## 2018-06-23 ENCOUNTER — Other Ambulatory Visit: Payer: PPO

## 2018-06-23 DIAGNOSIS — E870 Hyperosmolality and hypernatremia: Secondary | ICD-10-CM | POA: Diagnosis not present

## 2018-06-23 DIAGNOSIS — I951 Orthostatic hypotension: Secondary | ICD-10-CM | POA: Diagnosis not present

## 2018-06-23 DIAGNOSIS — E2740 Unspecified adrenocortical insufficiency: Secondary | ICD-10-CM | POA: Diagnosis not present

## 2018-06-23 DIAGNOSIS — R634 Abnormal weight loss: Secondary | ICD-10-CM | POA: Diagnosis not present

## 2018-06-26 ENCOUNTER — Encounter: Payer: Self-pay | Admitting: Internal Medicine

## 2018-06-26 MED ORDER — PREDNISONE 5 MG PO TABS: ORAL_TABLET | ORAL | 1 refills | Status: DC

## 2018-06-27 LAB — CBC WITH DIFFERENTIAL/PLATELET
BASOS PCT: 1.5 %
Basophils Absolute: 80 cells/uL (ref 0–200)
EOS PCT: 3.8 %
Eosinophils Absolute: 201 cells/uL (ref 15–500)
HEMATOCRIT: 42.7 % (ref 38.5–50.0)
HEMOGLOBIN: 14.4 g/dL (ref 13.2–17.1)
LYMPHS ABS: 2099 {cells}/uL (ref 850–3900)
MCH: 32.9 pg (ref 27.0–33.0)
MCHC: 33.7 g/dL (ref 32.0–36.0)
MCV: 97.5 fL (ref 80.0–100.0)
MONOS PCT: 7.2 %
MPV: 10.3 fL (ref 7.5–12.5)
NEUTROS ABS: 2539 {cells}/uL (ref 1500–7800)
Neutrophils Relative %: 47.9 %
Platelets: 198 10*3/uL (ref 140–400)
RBC: 4.38 10*6/uL (ref 4.20–5.80)
RDW: 12.5 % (ref 11.0–15.0)
Total Lymphocyte: 39.6 %
WBC mixed population: 382 cells/uL (ref 200–950)
WBC: 5.3 10*3/uL (ref 3.8–10.8)

## 2018-06-27 LAB — COMPLETE METABOLIC PANEL WITH GFR
AG Ratio: 1.5 (calc) (ref 1.0–2.5)
ALBUMIN MSPROF: 3.8 g/dL (ref 3.6–5.1)
ALKALINE PHOSPHATASE (APISO): 61 U/L (ref 40–115)
ALT: 19 U/L (ref 9–46)
AST: 18 U/L (ref 10–35)
BILIRUBIN TOTAL: 0.5 mg/dL (ref 0.2–1.2)
BUN / CREAT RATIO: 14 (calc) (ref 6–22)
BUN: 22 mg/dL (ref 7–25)
CHLORIDE: 106 mmol/L (ref 98–110)
CO2: 29 mmol/L (ref 20–32)
CREATININE: 1.6 mg/dL — AB (ref 0.70–1.11)
Calcium: 8.8 mg/dL (ref 8.6–10.3)
GFR, Est African American: 46 mL/min/{1.73_m2} — ABNORMAL LOW (ref >=60)
GFR, Est Non African American: 40 mL/min/{1.73_m2} — ABNORMAL LOW (ref >=60)
GLUCOSE: 152 mg/dL — AB (ref 65–99)
Globulin: 2.6 g/dL (calc) (ref 1.9–3.7)
Potassium: 4.5 mmol/L (ref 3.5–5.3)
Sodium: 142 mmol/L (ref 135–146)
Total Protein: 6.4 g/dL (ref 6.1–8.1)

## 2018-06-27 LAB — CORTISOL: CORTISOL PLASMA: 12.3 ug/dL

## 2018-06-27 LAB — ACTH: C206 ACTH: 16 pg/mL (ref 6–50)

## 2018-07-17 ENCOUNTER — Other Ambulatory Visit: Payer: Self-pay | Admitting: *Deleted

## 2018-07-17 ENCOUNTER — Ambulatory Visit (INDEPENDENT_AMBULATORY_CARE_PROVIDER_SITE_OTHER): Payer: PPO

## 2018-07-17 DIAGNOSIS — Z23 Encounter for immunization: Secondary | ICD-10-CM | POA: Diagnosis not present

## 2018-07-17 MED ORDER — NITROGLYCERIN 0.4 MG SL SUBL: SUBLINGUAL_TABLET | SUBLINGUAL | 6 refills | Status: DC

## 2018-07-28 ENCOUNTER — Other Ambulatory Visit: Payer: Self-pay | Admitting: Internal Medicine

## 2018-08-02 ENCOUNTER — Other Ambulatory Visit: Payer: Self-pay | Admitting: Internal Medicine

## 2018-08-02 DIAGNOSIS — F419 Anxiety disorder, unspecified: Secondary | ICD-10-CM

## 2018-08-02 MED ORDER — ALPRAZOLAM 1 MG PO TABS: ORAL_TABLET | ORAL | 0 refills | Status: DC

## 2018-08-23 ENCOUNTER — Encounter: Payer: Self-pay | Admitting: Cardiovascular Disease

## 2018-08-23 ENCOUNTER — Ambulatory Visit (INDEPENDENT_AMBULATORY_CARE_PROVIDER_SITE_OTHER): Payer: PPO | Admitting: Internal Medicine

## 2018-08-23 VITALS — BP 138/74 | HR 97 | Temp 97.3°F | Resp 16

## 2018-08-23 DIAGNOSIS — R55 Syncope and collapse: Secondary | ICD-10-CM | POA: Diagnosis not present

## 2018-08-23 DIAGNOSIS — H10023 Other mucopurulent conjunctivitis, bilateral: Secondary | ICD-10-CM | POA: Diagnosis not present

## 2018-08-23 DIAGNOSIS — Z79899 Other long term (current) drug therapy: Secondary | ICD-10-CM

## 2018-08-23 DIAGNOSIS — I951 Orthostatic hypotension: Secondary | ICD-10-CM | POA: Diagnosis not present

## 2018-08-23 DIAGNOSIS — Z136 Encounter for screening for cardiovascular disorders: Secondary | ICD-10-CM

## 2018-08-23 MED ORDER — NEOMYCIN-POLYMYXIN-DEXAMETH 3.5-10000-0.1 OP SUSP: OPHTHALMIC | 3 refills | Status: DC

## 2018-08-23 NOTE — Progress Notes (Signed)
Subjective:    Patient ID: John [name], male    DOB: 22-May-1935, 83 y.o.   MRN: [id number]  HPI     This nice 83 yo recently [facility] w/Hx/ labile HTN/Postural orthostasis, ASCAD/CABG, PreDM passed out earlier today at [employer] and was checked by EMS and CBG was 99 mg%. BP was reported low at the scene. Patient declined transport to ER.   Medication Sig  . ALPRAZolam 1 MG tablet Take 1/2 to 1 tablet 2 to 3 x/day ONLY if needed for Anxiety Attack and please try to limit to 5 days /week to avoid addiction  . aspirin 81 MG tablet Take  daily.    [name] VITAMIN D 2000 UNITS capsule Take 8,000 Units  daily.   . clopidogrel  75 MG tablet TAKE 1 TABLET   DAILY  . FLORINEF 0.1 MG tablet TAKE 1 TABLET  TWICE DAILY  . loperamide  2 MG capsule Take 4 mg by mouth as needed for diarrhea or loose stools.   . Melatonin 5 MG CAPS Take 10 mg by mouth at bedtime.   . mirtazapine15 MG tablet Take 1/2 to 1 tablet 1 hour before sleep for appetite  . NITROSTAT 0.4 MG SL tablet as needed for Angina   . pantoprazole  40 MG tablet TAKE ONE TABLET   DAILY  . UROCIT-K 10 MEQ 1080 MG SR  TAKE ONE TABLET  TWICE DAILY  . predniSONE 5 MG tablet Take 1 tablet 3 x /day or as directed  . prochlorperazine  5 MG tablet TAKE 1-2 TABS THREE TIMES A DAY AS NEEDED   . SEROQUEL 25 MG tablet TAKE 1 TABLET  THREE TIMES DAILY  . simvastatin  80 MG tablet TAKE ONE TABLET  AT BEDTIME    Allergies  Allergen Reactions  . Gabapentin Other (See Comments)    Severe confusion  . Prozac [Fluoxetine Hcl] Other (See Comments)    Patient state that it does not agree with him  . Soma [Carisoprodol] Other (See Comments)    Patient stated that it does not agree with him  . Beta Adrenergic Blockers Other (See Comments)    REACTION: Bradycardia   Past Medical History:  Diagnosis Date  . Anxiety   . Arthritis    "lower back; left ankle" (03/20/2014)  . ASTHMA   . Blood transfusion    "related to OHS, ulcers"  . CAD (coronary artery  disease)    a. s/p 2V CABG 1996. b. Last cath 12/2016 -> patent grafts, med rx.  . CAD, ARTERY BYPASS GRAFT   . CKD (chronic kidney disease), stage II   . Colon polyps    adenomatous polyps  . COPD   . Diverticulosis   . Duodenal ulcer without hemorrhage or perforation 12/29/2011  . GASTROESOPHAGEAL REFLUX DISEASE   . HYPERLIPIDEMIA   . Insomnia   . Leukodystrophy ([place])   . Memory loss   . Nephrolithiasis    "I've had over 320 kidney stones" (03/20/2014)  . NEPHROLITHIASIS, HX OF 03/12/2009  . Parkinsonism ([place])   . Sleep apnea    "don't always wear it" (03/20/2014)  . SUPRAVENTRICULAR TACHYCARDIA, HX OF   . SVT (supraventricular tachycardia) ([place]) 03/12/2009  . TIA   . Ulcer    Past Surgical History:  Procedure Laterality Date  . ANKLE FRACTURE SURGERY Left ~ 2012  . CARDIAC CATHETERIZATION    . COLONOSCOPY    . CORONARY ARTERY BYPASS GRAFT  1996   CABG X2  .  CYSTOSCOPY WITH URETEROSCOPY, STONE BASKETRY AND STENT PLACEMENT    . Cystourethroscopy with stent removal.    . EXCISIONAL HEMORRHOIDECTOMY    . GASTRECTOMY    . HIATAL HERNIA REPAIR    . IRRIGATION AND DEBRIDEMENT SEBACEOUS CYST     "off my back  . LEFT HEART CATH AND CORS/GRAFTS ANGIOGRAPHY N/A 12/23/2016   Procedure: Left Heart Cath and Cors/Grafts Angiography;  Surgeon: Burnell Blanks, MD;  Location: [facility];  Service: Cardiovascular;  Laterality: N/A;  . POLYPECTOMY    . PROSTATE SURGERY     "took the center of my prostate out"  . UPPER GASTROINTESTINAL ENDOSCOPY    . VAGOTOMY         10 point systems review negative except as above.    Objective:   Physical Exam  BP 138/74   Pulse 97   Temp (!) 97.3 F (36.3 C)   Resp 16    Postural Sitting BP 141/89   P 83                &               Standing BP 120/83   P 100   HEENT - WNL. Except a mucopurulent conjunctivitis Neck - supple.  Chest - Clear equal BS. Cor - Nl HS. RRR w/o sig MGR. PP 1(+). No edema. MS- FROM w/o deformities.   Gait Nl. Neuro -  Nl w/o focal abnormalities.    Assessment & Plan:   1. Syncope  - CBC with Differential/Platelet - COMPLETE METABOLIC PANEL WITH GFR - Troponin I  2. Postural hypotension  - Recc increase his Florinef back to bid & monitor standing BP & P - CBC with Differential/Platelet - COMPLETE METABOLIC PANEL WITH GFR  3. Other mucopurulent conjunctivitis of both eyes  - neomycin-polymyxin b-dexamethasone (MAXITROL) 3.5-10000-0.1 SUSP; 1 - 2 drops to affected Eye(s) 4 x /day as directed  Dispense: 10 mL; Refill: 3  4. Medication management  - CBC with Differential/Platelet - COMPLETE METABOLIC PANEL WITH GFR - Troponin I

## 2018-08-24 LAB — CBC WITH DIFFERENTIAL/PLATELET
BASOS ABS: 41 {cells}/uL (ref 0–200)
BASOS PCT: 0.6 %
EOS ABS: 88 {cells}/uL (ref 15–500)
Eosinophils Relative: 1.3 %
HCT: 41.3 % (ref 38.5–50.0)
Hemoglobin: 14.2 g/dL (ref 13.2–17.1)
Lymphs Abs: 1108 cells/uL (ref 850–3900)
MCH: 32.9 pg (ref 27.0–33.0)
MCHC: 34.4 g/dL (ref 32.0–36.0)
MCV: 95.6 fL (ref 80.0–100.0)
MPV: 10.1 fL (ref 7.5–12.5)
Monocytes Relative: 6.7 %
Neutro Abs: 5107 cells/uL (ref 1500–7800)
Neutrophils Relative %: 75.1 %
PLATELETS: 210 10*3/uL (ref 140–400)
RBC: 4.32 10*6/uL (ref 4.20–5.80)
RDW: 12.7 % (ref 11.0–15.0)
TOTAL LYMPHOCYTE: 16.3 %
WBC: 6.8 10*3/uL (ref 3.8–10.8)
WBCMIX: 456 {cells}/uL (ref 200–950)

## 2018-08-24 LAB — COMPLETE METABOLIC PANEL WITH GFR
AG Ratio: 1.4 (calc) (ref 1.0–2.5)
ALT: 26 U/L (ref 9–46)
AST: 21 U/L (ref 10–35)
Albumin: 3.7 g/dL (ref 3.6–5.1)
Alkaline phosphatase (APISO): 59 U/L (ref 40–115)
BILIRUBIN TOTAL: 0.7 mg/dL (ref 0.2–1.2)
BUN/Creatinine Ratio: 13 (calc) (ref 6–22)
BUN: 22 mg/dL (ref 7–25)
CALCIUM: 9.7 mg/dL (ref 8.6–10.3)
CHLORIDE: 103 mmol/L (ref 98–110)
CO2: 29 mmol/L (ref 20–32)
Creat: 1.64 mg/dL — ABNORMAL HIGH (ref 0.70–1.11)
GFR, EST AFRICAN AMERICAN: 45 mL/min/{1.73_m2} — AB (ref >=60)
GFR, EST NON AFRICAN AMERICAN: 39 mL/min/{1.73_m2} — AB (ref >=60)
Globulin: 2.7 g/dL (calc) (ref 1.9–3.7)
Glucose, Bld: 209 mg/dL — ABNORMAL HIGH (ref 65–99)
Potassium: 4.8 mmol/L (ref 3.5–5.3)
Sodium: 141 mmol/L (ref 135–146)
TOTAL PROTEIN: 6.4 g/dL (ref 6.1–8.1)

## 2018-08-24 LAB — TROPONIN I: Troponin I: 0.01 ng/mL (ref <=0.0)

## 2018-08-27 ENCOUNTER — Encounter: Payer: Self-pay | Admitting: Internal Medicine

## 2018-08-29 NOTE — Progress Notes (Signed)
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  Followed by cardiology  Autonomic postural hypotension Symptoms stable on florinef, syncopal episode last week attributed to hypoglycemia but improved since, no dizziness or syncope  Hypertension Well controlled with current medications  Monitor blood pressure at home; patient to call if consistently greater than 130/80 Continue DASH diet.   Reminder to go to the ER if any CP, SOB, nausea, dizziness, severe HA, changes vision/speech, left arm numbness and tingling and jaw pain.  Cholesterol Currently at goal;  Continue low cholesterol diet and exercise.  Check lipid panel.   Other abnormal glucose Recent A1Cs at goal Discussed diet/exercise, weight management  Defer A1C; check CMP  BMI 21 Continue to recommend diet heavy in fruits and veggies and low in animal meats, cheeses, and dairy products, appropriate calorie intake Discuss exercise recommendations routinely Continue to monitor weight at each visit  Vitamin D Def At goal at last visit; continue supplementation to maintain goal of 70-100 Defer Vit D level  Continue diet and meds as discussed. Further disposition pending results of labs. Discussed med's effects and SE's.   Over 30 minutes of exam, counseling, chart review, and critical decision making was performed.   Future Appointments  Date Time Provider [place]  09/05/2018  2:00 PM Ward Givens, NP [facility] None  09/11/2018  2:00 PM [facility] 1 [id number]  09/15/2018  2:00 PM Burnell Blanks, MD [facility]  12/18/2018 11:00 AM Unk Pinto, MD [facility] None    ----------------------------------------------------------------------------------------------------------------------  HPI 83 y.o. male  presents  for 3 month follow up on hypertension, cholesterol, glucose management, weight and vitamin D deficiency. Patient has hx/o mild Dementia & is followed by Dr Rexene Alberts for suspect possible early or atypical Parkinson's Dz. Patient also has hx/o recurrent kidney stones (>200+) followed by Dr Dutch Gray as needed. HTN predates 1960's, recenty labile with postural hypotension and now takes 1 to 2 Florinef daily to avoid postural Hypotension. Patient underwent CABG x 2 V in 1996 And has done well since. Continues to follow with cardiology.   He has ongoing insomnia and is currently on melatonin, seroquel, xanax, reports symptoms are well controlled on current regimen. he currently takes 1 mg daily at night to sleep; has not done well with attempts to taper.   He recently lost his wife, but reports has lots of people checking on him and is doing fairly.   He is feeling much better since he was seen last week after syncopal episode following hypoglycemia. Denies chest pain, dyspnea, dizziness, fatigue.   BMI is Body mass index is 21.12 kg/m., he has been working on diet and exercise, walks on treadmill.  Wt Readings from Last 3 Encounters:  08/30/18 153 lb 9.6 oz (69.7 kg)  06/22/18 149 lb 3.2 oz (67.7 kg)  05/24/18 147 lb (66.7 kg)   His blood pressure has been controlled at home, today their BP is BP: 112/64  He does workout. He denies chest pain, shortness of breath, dizziness.   He is on cholesterol medication Simvastatin 40 mg MWF and denies myalgias. His cholesterol is at goal. The cholesterol last visit was:   Lab Results  Component Value Date   CHOL 155 05/24/2018   HDL 59 05/24/2018   LDLCALC 77 05/24/2018   TRIG 105 05/24/2018   CHOLHDL 2.6 05/24/2018    He has been  working on diet and exercise for glucose management, and denies increased appetite, nausea, paresthesia of the feet, polydipsia and polyuria. Last A1C in the office was:  Lab Results  Component Value Date   HGBA1C 5.4  05/24/2018   Patient is on Vitamin D supplement, taking 8000 IU daily:    Lab Results  Component Value Date   VD25OH 66 05/24/2018       Current Medications:  Current Outpatient Medications on File Prior to Visit  Medication Sig  . ALPRAZolam (XANAX) 1 MG tablet Take 1/2 to 1 tablet 2 to 3 x/day ONLY if needed for Anxiety Attack and please try to limit to 5 days /week to avoid addiction  . aspirin 81 MG tablet Take 81 mg by mouth daily.    . Cholecalciferol (VITAMIN D3) 2000 UNITS capsule Take 8,000 Units by mouth daily.   . clopidogrel (PLAVIX) 75 MG tablet TAKE 1 TABLET BY MOUTH ONCE DAILY  . fludrocortisone (FLORINEF) 0.1 MG tablet TAKE 1 TABLET BY MOUTH TWICE DAILY  . glucose blood (FREESTYLE LITE) test strip TEST BLOOD SUGAR ONCE DAILY. Dx-E11.22.  [name] Lancets (FREESTYLE) lancets 1 each by Other route as needed for other. Use as instructed  . loperamide (IMODIUM) 2 MG capsule Take 4 mg by mouth as needed for diarrhea or loose stools.   . Melatonin 5 MG CAPS Take 10 mg by mouth at bedtime.   . mirtazapine (REMERON) 15 MG tablet Take 1/2 to 1 tablet 1 hour before sleep for appetite  . neomycin-polymyxin b-dexamethasone (MAXITROL) 3.5-10000-0.1 SUSP 1 - 2 drops to affected Eye(s) 4 x /day as directed  . nitroGLYCERIN (NITROSTAT) 0.4 MG SL tablet Sig: 1 tablet under tongue every 3 to 5 minutes as needed for Angina - Please Dispense # 2 bottles of # 25 tabs  . pantoprazole (PROTONIX) 40 MG tablet TAKE ONE TABLET BY MOUTH ONCE DAILY  . potassium citrate (UROCIT-K) 10 MEQ (1080 MG) SR tablet TAKE ONE TABLET BY MOUTH TWICE DAILY  . predniSONE (DELTASONE) 5 MG tablet Take 1 tablet 3 x /day or as directed  . prochlorperazine (COMPAZINE) 5 MG tablet TAKE 1-2 TABS THREE TIMES A DAY AS NEEDED FOR NAUSEA  . QUEtiapine (SEROQUEL) 25 MG tablet TAKE 1 TABLET BY MOUTH THREE TIMES DAILY  . simvastatin (ZOCOR) 80 MG tablet TAKE ONE TABLET BY MOUTH AT BEDTIME   No current facility-administered  medications on file prior to visit.      Allergies:  Allergies  Allergen Reactions  . Gabapentin Other (See Comments)    Severe confusion  . Prozac [Fluoxetine Hcl] Other (See Comments)    Patient state that it does not agree with him  . Soma [Carisoprodol] Other (See Comments)    Patient stated that it does not agree with him  . Beta Adrenergic Blockers Other (See Comments)    REACTION: Bradycardia     Medical History:  Past Medical History:  Diagnosis Date  . Anxiety   . Arthritis    "lower back; left ankle" (03/20/2014)  . ASTHMA   . Blood transfusion    "related to OHS, ulcers"  . CAD (coronary artery disease)    a. s/p 2V CABG 1996. b. Last cath 12/2016 -> patent grafts, med rx.  . CAD, ARTERY BYPASS GRAFT   . CKD (chronic kidney disease), stage II   . Colon polyps    adenomatous polyps  . COPD   . Diverticulosis   . Duodenal ulcer without hemorrhage or perforation 12/29/2011  .  GASTROESOPHAGEAL REFLUX DISEASE   . HYPERLIPIDEMIA   . Insomnia   . Leukodystrophy ([place])   . Memory loss   . Nephrolithiasis    "I've had over 320 kidney stones" (03/20/2014)  . NEPHROLITHIASIS, HX OF 03/12/2009  . Parkinsonism ([place])   . Sleep apnea    "don't always wear it" (03/20/2014)  . SUPRAVENTRICULAR TACHYCARDIA, HX OF   . SVT (supraventricular tachycardia) ([place]) 03/12/2009  . TIA   . Ulcer    Family history- Reviewed and unchanged Social history- Reviewed and unchanged   Review of Systems:  Review of Systems  Constitutional: Negative for malaise/fatigue and weight loss.  HENT: Negative for hearing loss and tinnitus.   Eyes: Negative for blurred vision and double vision.  Respiratory: Negative for cough, shortness of breath and wheezing.   Cardiovascular: Negative for chest pain, palpitations, orthopnea, claudication and leg swelling.  Gastrointestinal: Negative for abdominal pain, blood in stool, constipation, diarrhea, heartburn, melena, nausea and vomiting.  Genitourinary:  Negative.   Musculoskeletal: Negative for joint pain and myalgias.  Skin: Negative for rash.  Neurological: Negative for dizziness, tingling, sensory change, weakness and headaches.  Endo/Heme/Allergies: Negative for polydipsia.  Psychiatric/Behavioral: The patient has insomnia.   All other systems reviewed and are negative.     Physical Exam: BP 112/64   Pulse 90   Temp 97.7 F (36.5 C)   Ht 5' 11.5" (1.816 m)   Wt 153 lb 9.6 oz (69.7 kg)   SpO2 96%   BMI 21.12 kg/m  Wt Readings from Last 3 Encounters:  08/30/18 153 lb 9.6 oz (69.7 kg)  06/22/18 149 lb 3.2 oz (67.7 kg)  05/24/18 147 lb (66.7 kg)   General Appearance: Well nourished, in no apparent distress. Eyes: PERRLA, EOMs, conjunctiva no swelling or erythema Sinuses: No Frontal/maxillary tenderness ENT/Mouth: Ext aud canals clear, TMs without erythema, bulging. No erythema, swelling, or exudate on post pharynx.  Tonsils not swollen or erythematous. Hearing normal.  Neck: Supple, thyroid normal.  Respiratory: Respiratory effort normal, BS equal bilaterally without rales, rhonchi, wheezing or stridor.  Cardio: RRR with no MRGs. Brisk peripheral pulses without edema.  Abdomen: Soft, + BS.  Non tender, no guarding, rebound, hernias, masses. Lymphatics: Non tender without lymphadenopathy.  Musculoskeletal: Full ROM, 5/5 strength, Normal gait Skin: Warm, dry without rashes, lesions, ecchymosis.  Neuro: Cranial nerves intact. No cerebellar symptoms.  Psych: Awake and oriented X 3, normal affect, Insight and Judgment appropriate.    Izora Ribas, NP 2:44 PM [facility]

## 2018-08-30 ENCOUNTER — Encounter: Payer: Self-pay | Admitting: Adult Health

## 2018-08-30 ENCOUNTER — Ambulatory Visit (INDEPENDENT_AMBULATORY_CARE_PROVIDER_SITE_OTHER): Payer: PPO | Admitting: Adult Health

## 2018-08-30 VITALS — BP 112/64 | HR 90 | Temp 97.7°F | Ht 71.5 in | Wt 153.6 lb

## 2018-08-30 DIAGNOSIS — Z6821 Body mass index (BMI) 21.0-21.9, adult: Secondary | ICD-10-CM

## 2018-08-30 DIAGNOSIS — I10 Essential (primary) hypertension: Secondary | ICD-10-CM | POA: Diagnosis not present

## 2018-08-30 DIAGNOSIS — Z79899 Other long term (current) drug therapy: Secondary | ICD-10-CM

## 2018-08-30 DIAGNOSIS — G4700 Insomnia, unspecified: Secondary | ICD-10-CM

## 2018-08-30 DIAGNOSIS — K2100 Gastro-esophageal reflux disease with esophagitis, without bleeding: Secondary | ICD-10-CM

## 2018-08-30 DIAGNOSIS — I25812 Atherosclerosis of bypass graft of coronary artery of transplanted heart without angina pectoris: Secondary | ICD-10-CM | POA: Diagnosis not present

## 2018-08-30 DIAGNOSIS — E559 Vitamin D deficiency, unspecified: Secondary | ICD-10-CM

## 2018-08-30 DIAGNOSIS — N1831 Chronic kidney disease, stage 3a: Secondary | ICD-10-CM

## 2018-08-30 DIAGNOSIS — N183 Chronic kidney disease, stage 3 (moderate): Secondary | ICD-10-CM | POA: Diagnosis not present

## 2018-08-30 DIAGNOSIS — E782 Mixed hyperlipidemia: Secondary | ICD-10-CM

## 2018-08-30 NOTE — Patient Instructions (Signed)
Know what a healthy weight is for you (roughly BMI <25) and aim to maintain this  Aim for 7+ servings of fruits and vegetables daily  65-80+ fluid ounces of water or unsweet tea for healthy kidneys  Limit to max 1 drink of alcohol per day; avoid smoking/tobacco  Limit animal fats in diet for cholesterol and heart health - choose grass fed whenever available  Avoid highly processed foods, and foods high in saturated/trans fats  Aim for low stress - take time to unwind and care for your mental health  Aim for 150 min of moderate intensity exercise weekly for heart health, and weights twice weekly for bone health  Aim for 7-9 hours of sleep daily    Insomnia Insomnia is frequent trouble falling and/or staying asleep. Insomnia can be a long term problem or a short term problem. Both are common. Insomnia can be a short term problem when the wakefulness is related to a certain stress or worry. Long term insomnia is often related to ongoing stress during waking hours and/or poor sleeping habits. Overtime, sleep deprivation itself can make the problem worse. Every little thing feels more severe because you are overtired and your ability to cope is decreased. CAUSES   Stress, anxiety, and depression.  Poor sleeping habits.  Distractions such as TV in the bedroom.  Naps close to bedtime.  Engaging in emotionally charged conversations before bed.  Technical reading before sleep.  Alcohol and other sedatives. They may make the problem worse. They can hurt normal sleep patterns and normal dream activity.  Stimulants such as caffeine for several hours prior to bedtime.  Pain syndromes and shortness of breath can cause insomnia.  Exercise late at night.  Changing time zones may cause sleeping problems (jet lag). It is sometimes helpful to have someone observe your sleeping patterns. They should look for periods of not breathing during the night (sleep apnea). They should also look to  see how long those periods last. If you live alone or observers are uncertain, you can also be observed at a sleep clinic where your sleep patterns will be professionally monitored. Sleep apnea requires a checkup and treatment. Give your caregivers your medical history. Give your caregivers observations your family has made about your sleep.  SYMPTOMS   Not feeling rested in the morning.  Anxiety and restlessness at bedtime.  Difficulty falling and staying asleep. TREATMENT   Your caregiver may prescribe treatment for an underlying medical disorders. Your caregiver can give advice or help if you are using alcohol or other drugs for self-medication. Treatment of underlying problems will usually eliminate insomnia problems.  Medications can be prescribed for short time use. They are generally not recommended for lengthy use.  Over-the-counter sleep medicines are not recommended for lengthy use. They can be habit forming.  You can promote easier sleeping by making lifestyle changes such as:  Using relaxation techniques that help with breathing and reduce muscle tension.  Exercising earlier in the day.  Changing your diet and the time of your last meal. No night time snacks.  Establish a regular time to go to bed.  Counseling can help with stressful problems and worry.  Soothing music and white noise may be helpful if there are background noises you cannot remove.  Stop tedious detailed work at least one hour before bedtime. HOME CARE INSTRUCTIONS   Keep a diary. Inform your caregiver about your progress. This includes any medication side effects. See your caregiver regularly. Take note of:  Times when you are asleep.  Times when you are awake during the night.  The quality of your sleep.  How you feel the next day. This information will help your caregiver care for you.  Get out of bed if you are still awake after 15 minutes. Read or do some quiet activity. Keep the lights  down. Wait until you feel sleepy and go back to bed.  Keep regular sleeping and waking hours. Avoid naps.  Exercise regularly.  Avoid distractions at bedtime. Distractions include watching television or engaging in any intense or detailed activity like attempting to balance the household checkbook.  Develop a bedtime ritual. Keep a familiar routine of bathing, brushing your teeth, climbing into bed at the same time each night, listening to soothing music. Routines increase the success of falling to sleep faster.  Use relaxation techniques. This can be using breathing and muscle tension release routines. It can also include visualizing peaceful scenes. You can also help control troubling or intruding thoughts by keeping your mind occupied with boring or repetitive thoughts like the old concept of counting sheep. You can make it more creative like imagining planting one beautiful flower after another in your backyard garden.  During your day, work to eliminate stress. When this is not possible use some of the previous suggestions to help reduce the anxiety that accompanies stressful situations. MAKE SURE YOU:   Understand these instructions.  Will watch your condition.  Will get help right away if you are not doing well or get worse. Document Released: 10/08/2000 Document Revised: 01/03/2012 Document Reviewed: 11/08/2007 [facility] Patient Information 2015 [place], [place]. This information is not intended to replace advice given to you by your health care provider. Make sure you discuss any questions you have with your health care provider.

## 2018-08-31 LAB — COMPLETE METABOLIC PANEL WITH GFR
AG RATIO: 1.6 (calc) (ref 1.0–2.5)
ALKALINE PHOSPHATASE (APISO): 54 U/L (ref 40–115)
ALT: 21 U/L (ref 9–46)
AST: 20 U/L (ref 10–35)
Albumin: 3.8 g/dL (ref 3.6–5.1)
BILIRUBIN TOTAL: 0.5 mg/dL (ref 0.2–1.2)
BUN/Creatinine Ratio: 13 (calc) (ref 6–22)
BUN: 18 mg/dL (ref 7–25)
CALCIUM: 8.8 mg/dL (ref 8.6–10.3)
CHLORIDE: 105 mmol/L (ref 98–110)
CO2: 29 mmol/L (ref 20–32)
Creat: 1.38 mg/dL — ABNORMAL HIGH (ref 0.70–1.11)
GFR, EST NON AFRICAN AMERICAN: 48 mL/min/{1.73_m2} — AB (ref >=60)
GFR, Est African American: 55 mL/min/{1.73_m2} — ABNORMAL LOW (ref >=60)
Globulin: 2.4 g/dL (calc) (ref 1.9–3.7)
Glucose, Bld: 84 mg/dL (ref 65–99)
POTASSIUM: 4.3 mmol/L (ref 3.5–5.3)
Sodium: 141 mmol/L (ref 135–146)
Total Protein: 6.2 g/dL (ref 6.1–8.1)

## 2018-08-31 LAB — CBC WITH DIFFERENTIAL/PLATELET
BASOS ABS: 50 {cells}/uL (ref 0–200)
Basophils Relative: 1 %
EOS PCT: 2 %
Eosinophils Absolute: 100 cells/uL (ref 15–500)
HEMATOCRIT: 40.5 % (ref 38.5–50.0)
Hemoglobin: 13.8 g/dL (ref 13.2–17.1)
LYMPHS ABS: 1405 {cells}/uL (ref 850–3900)
MCH: 33.2 pg — ABNORMAL HIGH (ref 27.0–33.0)
MCHC: 34.1 g/dL (ref 32.0–36.0)
MCV: 97.4 fL (ref 80.0–100.0)
MPV: 10.1 fL (ref 7.5–12.5)
Monocytes Relative: 6.6 %
NEUTROS PCT: 62.3 %
Neutro Abs: 3115 cells/uL (ref 1500–7800)
Platelets: 213 10*3/uL (ref 140–400)
RBC: 4.16 10*6/uL — ABNORMAL LOW (ref 4.20–5.80)
RDW: 12.9 % (ref 11.0–15.0)
Total Lymphocyte: 28.1 %
WBC: 5 10*3/uL (ref 3.8–10.8)
WBCMIX: 330 {cells}/uL (ref 200–950)

## 2018-08-31 LAB — LIPID PANEL
Cholesterol: 154 mg/dL (ref <200)
HDL: 71 mg/dL (ref >40)
LDL Cholesterol (Calc): 69 mg/dL (calc)
Non-HDL Cholesterol (Calc): 83 mg/dL (calc) (ref <130)
Total CHOL/HDL Ratio: 2.2 (calc) (ref <5.0)
Triglycerides: 60 mg/dL (ref <150)

## 2018-08-31 LAB — MAGNESIUM: MAGNESIUM: 1.8 mg/dL (ref 1.5–2.5)

## 2018-08-31 LAB — TSH: TSH: 2.18 mIU/L (ref 0.40–4.50)

## 2018-08-31 NOTE — Addendum Note (Signed)
Addended by: Unk Pinto on: 08/31/2018 12:32 AM   Modules accepted: Orders

## 2018-09-05 ENCOUNTER — Encounter: Payer: Self-pay | Admitting: Adult Health

## 2018-09-05 ENCOUNTER — Ambulatory Visit (INDEPENDENT_AMBULATORY_CARE_PROVIDER_SITE_OTHER): Payer: PPO | Admitting: Adult Health

## 2018-09-05 VITALS — BP 128/78 | HR 76 | Ht 71.5 in | Wt 152.4 lb

## 2018-09-05 DIAGNOSIS — R413 Other amnesia: Secondary | ICD-10-CM | POA: Diagnosis not present

## 2018-09-05 NOTE — Progress Notes (Signed)
I have read the note, and I agree with the clinical assessment and plan.  Charles K Willis   

## 2018-09-05 NOTE — Progress Notes (Signed)
PATIENT: John [name] DOB: 1935/06/21  REASON FOR VISIT: follow up HISTORY FROM: patient  HISTORY OF PRESENT ILLNESS: Today 09/05/18:  Mr. Mayberry is an 83 year old male with a history of memory.  He returns today for follow-up.  He feels that his memory has remained stable.  He does report that his wife passed away last month unexpectedly.  He lives at home alone.  He is able to complete all ADLs independently.  Denies any trouble driving.  Reports that he is sleeping well.  He manages his own finances without difficulty.  Reports good appetite.  He returns today for an evaluation.  HISTORY05 /06/19:  Mr. Basnett is an 83 year old male with a history of memory disturbance.  He returns today for follow-up.  He feels that his memory has remained relatively stable.  He lives at home with his wife.  He is able to complete all ADLs.  He denies any trouble managing his finances.  He reports that he does not sleep as well as he used to but it has improved.  He denies any changes with mood or behavior.  Denies hallucinations.  He operates a Teacher, music without difficulty.  Denies any falls.  Denies any changes with his appetite.  Denies any trouble chewing or swallowing.  He does report that he lost weight since the last visit.  He reports that he is not trying to lose weight.  He returns today for evaluation.  REVIEW OF SYSTEMS: Out of a complete 14 system review of symptoms, the patient complains only of the following symptoms, and all other reviewed systems are negative.  See HPI  ALLERGIES: Allergies  Allergen Reactions  . Gabapentin Other (See Comments)    Severe confusion  . Prozac [Fluoxetine Hcl] Other (See Comments)    Patient state that it does not agree with him  . Soma [Carisoprodol] Other (See Comments)    Patient stated that it does not agree with him  . Beta Adrenergic Blockers Other (See Comments)    REACTION: Bradycardia    HOME MEDICATIONS: Outpatient  Medications Prior to Visit  Medication Sig Dispense Refill  . ALPRAZolam (XANAX) 1 MG tablet Take 1/2 to 1 tablet 2 to 3 x/day ONLY if needed for Anxiety Attack and please try to limit to 5 days /week to avoid addiction 90 tablet 0  . aspirin 81 MG tablet Take 81 mg by mouth daily.      . Cholecalciferol (VITAMIN D3) 2000 UNITS capsule Take 8,000 Units by mouth daily.     . clopidogrel (PLAVIX) 75 MG tablet TAKE 1 TABLET BY MOUTH ONCE DAILY 90 tablet 1  . fludrocortisone (FLORINEF) 0.1 MG tablet TAKE 1 TABLET BY MOUTH TWICE DAILY 180 tablet 1  . glucose blood (FREESTYLE LITE) test strip TEST BLOOD SUGAR ONCE DAILY. Dx-E11.22. 100 each 3  . Lancets (FREESTYLE) lancets 1 each by Other route as needed for other. Use as instructed    . loperamide (IMODIUM) 2 MG capsule Take 4 mg by mouth as needed for diarrhea or loose stools.     . Melatonin 5 MG CAPS Take 10 mg by mouth at bedtime.     . mirtazapine (REMERON) 15 MG tablet Take 1/2 to 1 tablet 1 hour before sleep for appetite 30 tablet 2  . neomycin-polymyxin b-dexamethasone (MAXITROL) 3.5-10000-0.1 SUSP 1 - 2 drops to affected Eye(s) 4 x /day as directed 10 mL 3  . nitroGLYCERIN (NITROSTAT) 0.4 MG SL tablet Sig: 1 tablet under tongue  every 3 to 5 minutes as needed for Angina - Please Dispense # 2 bottles of # 25 tabs 50 tablet 6  . pantoprazole (PROTONIX) 40 MG tablet TAKE ONE TABLET BY MOUTH ONCE DAILY 90 tablet 3  . potassium citrate (UROCIT-K) 10 MEQ (1080 MG) SR tablet TAKE ONE TABLET BY MOUTH TWICE DAILY 180 tablet 1  . predniSONE (DELTASONE) 5 MG tablet Take 1 tablet 3 x /day or as directed 90 tablet 1  . prochlorperazine (COMPAZINE) 5 MG tablet TAKE 1-2 TABS THREE TIMES A DAY AS NEEDED FOR NAUSEA 50 tablet 0  . QUEtiapine (SEROQUEL) 25 MG tablet TAKE 1 TABLET BY MOUTH THREE TIMES DAILY 270 tablet 0  . simvastatin (ZOCOR) 80 MG tablet TAKE ONE TABLET BY MOUTH AT BEDTIME 90 tablet 1   No facility-administered medications prior to visit.      PAST MEDICAL HISTORY: Past Medical History:  Diagnosis Date  . Anxiety   . Arthritis    "lower back; left ankle" (03/20/2014)  . ASTHMA   . Blood transfusion    "related to OHS, ulcers"  . CAD (coronary artery disease)    a. s/p 2V CABG 1996. b. Last cath 12/2016 -> patent grafts, med rx.  . CAD, ARTERY BYPASS GRAFT   . CKD (chronic kidney disease), stage II   . Colon polyps    adenomatous polyps  . COPD   . Diverticulosis   . Duodenal ulcer without hemorrhage or perforation 12/29/2011  . GASTROESOPHAGEAL REFLUX DISEASE   . HYPERLIPIDEMIA   . Insomnia   . 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 Memory loss   . Nephrolithiasis    "I've had over 320 kidney stones" (03/20/2014)  . NEPHROLITHIASIS, HX OF 03/12/2009  . Parkinsonism ([place])   . Sleep apnea    "don't always wear it" (03/20/2014)  . SUPRAVENTRICULAR TACHYCARDIA, HX OF   . SVT (supraventricular tachycardia) ([place]) 03/12/2009  . TIA   . Ulcer     PAST SURGICAL HISTORY: Past Surgical History:  Procedure Laterality Date  . ANKLE FRACTURE SURGERY Left ~ 2012  . CARDIAC CATHETERIZATION    . COLONOSCOPY    . CORONARY ARTERY BYPASS GRAFT  1996   CABG X2  . CYSTOSCOPY WITH URETEROSCOPY, STONE BASKETRY AND STENT PLACEMENT    . Cystourethroscopy with stent removal.    . EXCISIONAL HEMORRHOIDECTOMY    . GASTRECTOMY    . HIATAL HERNIA REPAIR    . IRRIGATION AND DEBRIDEMENT SEBACEOUS CYST     "off my back  . LEFT HEART CATH AND CORS/GRAFTS ANGIOGRAPHY N/A 12/23/2016   Procedure: Left Heart Cath and Cors/Grafts Angiography;  Surgeon: Burnell Blanks, MD;  Location: [facility];  Service: Cardiovascular;  Laterality: N/A;  . POLYPECTOMY    . PROSTATE SURGERY     "took the center of my prostate out"  . UPPER GASTROINTESTINAL ENDOSCOPY    . VAGOTOMY      FAMILY HISTORY: Family History  Problem Relation Age of Onset  . Alcohol abuse Father   . Stroke Mother   . Heart disease Brother   . Diabetes Brother   . Heart  disease Paternal Grandmother   . Colon cancer Neg Hx   . Stomach cancer Neg Hx     SOCIAL HISTORY: Social History   Socioeconomic History  . Marital status: Married    Spouse name: Not on file  . Number of children: 2  . Years of education: 81  . Highest education level: Not on file  Occupational History  . Occupation: Programme researcher, broadcasting/film/video at [employer]: [place]  . Financial resource strain: Not on file  . Food insecurity:    Worry: Not on file    Inability: Not on file  . Transportation needs:    Medical: Not on file    Non-medical: Not on file  Tobacco Use  . Smoking status: Former Smoker    Packs/day: 0.50    Types: Cigarettes    Last attempt to quit: 10/25/1962    Years since quitting: 55.9  . Smokeless tobacco: Current User    Types: Chew  Substance and Sexual Activity  . Alcohol use: No    Alcohol/week: 0.0 standard drinks  . Drug use: No  . Sexual activity: Yes  Lifestyle  . Physical activity:    Days per week: Not on file    Minutes per session: Not on file  . Stress: Not on file  Relationships  . Social connections:    Talks on phone: Not on file    Gets together: Not on file    Attends religious service: Not on file    Active member of club or organization: Not on file    Attends meetings of clubs or organizations: Not on file    Relationship status: Not on file  . Intimate partner violence:    Fear of current or ex partner: Not on file    Emotionally abused: Not on file    Physically abused: Not on file    Forced sexual activity: Not on file  Other Topics Concern  . Not on file  Social History Narrative   Denies caffeine use       PHYSICAL EXAM  Vitals:   09/05/18 1344  BP: 128/78  Pulse: 76  Weight: 152 lb 6.4 oz (69.1 kg)  Height: 5' 11.5" (1.816 m)   Body mass index is 20.96 kg/m.  Generalized: Well developed, in no acute distress   Neurological examination  Mentation: Alert oriented to time,  place, history taking. Follows all commands speech and language fluent Cranial nerve II-XII: Pupils were equal round reactive to light. Extraocular movements were full, visual field were full on confrontational test. Facial sensation and strength were normal. Uvula tongue midline. Head turning and shoulder shrug  were normal and symmetric. Motor: The motor testing reveals 5 over 5 strength of all 4 extremities. Good symmetric motor tone is noted throughout.  Sensory: Sensory testing is intact to soft touch on all 4 extremities. No evidence of extinction is noted.  Coordination: Cerebellar testing reveals good finger-nose-finger and heel-to-shin bilaterally.  Gait and station: Gait is normal. Reflexes: Deep tendon reflexes are symmetric and normal bilaterally.   DIAGNOSTIC DATA (LABS, IMAGING, TESTING) - I reviewed patient records, labs, notes, testing and imaging myself where available.  Lab Results  Component Value Date   WBC 5.0 08/30/2018   HGB 13.8 08/30/2018   HCT 40.5 08/30/2018   MCV 97.4 08/30/2018   PLT 213 08/30/2018      Component Value Date/Time   NA 141 08/30/2018 1503   NA 144 12/15/2016 1705   K 4.3 08/30/2018 1503   CL 105 08/30/2018 1503   CO2 29 08/30/2018 1503   GLUCOSE 84 08/30/2018 1503   BUN 18 08/30/2018 1503   BUN 21 12/15/2016 1705   CREATININE 1.38 (H) 08/30/2018 1503   CALCIUM 8.8 08/30/2018 1503   PROT 6.2 08/30/2018 1503   ALBUMIN 3.8 05/03/2017 1520   AST 20  08/30/2018 1503   ALT 21 08/30/2018 1503   ALKPHOS 47 05/03/2017 1520   BILITOT 0.5 08/30/2018 1503   GFRNONAA 48 (L) 08/30/2018 1503   GFRAA 55 (L) 08/30/2018 1503   Lab Results  Component Value Date   CHOL 154 08/30/2018   HDL 71 08/30/2018   LDLCALC 69 08/30/2018   TRIG 60 08/30/2018   CHOLHDL 2.2 08/30/2018   Lab Results  Component Value Date   HGBA1C 5.4 05/24/2018   Lab Results  Component Value Date   VITAMINB12 408 09/10/2015   Lab Results  Component Value Date   TSH  2.18 08/30/2018      ASSESSMENT AND PLAN 83 y.o. 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 here with:  1.  Memory disturbance  The patient's memory score has remained relatively stable.  We will continue to monitor the memory over time.  I have advised patient that if his symptoms worsen or he develops new symptoms he should let us know.  He will follow-up in 1 year or sooner if needed.   I spent 15 minutes with the patient. 50% of this time was spent reviewing his memory score   Ward Givens, MSN, NP-C 09/05/2018, 1:58 PM [facility] [address], [place], [place] [address] [phone]

## 2018-09-05 NOTE — Patient Instructions (Signed)
Your Plan:  Continue to monitor memory Memory score is stable If your symptoms worsen or you develop new symptoms please let us know.   Thank you for coming to see [place] at [facility]. I hope we have been able to provide you high quality care today.  You may receive a patient satisfaction survey over the next few weeks. We would appreciate your feedback and comments so that we may continue to improve ourselves and the health of our patients.

## 2018-09-07 ENCOUNTER — Other Ambulatory Visit: Payer: Self-pay | Admitting: Internal Medicine

## 2018-09-07 DIAGNOSIS — R634 Abnormal weight loss: Secondary | ICD-10-CM

## 2018-09-11 ENCOUNTER — Other Ambulatory Visit: Payer: Self-pay

## 2018-09-11 ENCOUNTER — Ambulatory Visit (HOSPITAL_COMMUNITY): Payer: PPO | Attending: Cardiology

## 2018-09-11 DIAGNOSIS — I351 Nonrheumatic aortic (valve) insufficiency: Secondary | ICD-10-CM | POA: Insufficient documentation

## 2018-09-11 DIAGNOSIS — I2510 Atherosclerotic heart disease of native coronary artery without angina pectoris: Secondary | ICD-10-CM | POA: Diagnosis not present

## 2018-09-13 ENCOUNTER — Telehealth: Payer: Self-pay | Admitting: *Deleted

## 2018-09-13 NOTE — Telephone Encounter (Signed)
-----   Message from Burnell Blanks, MD sent at 09/12/2018  3:36 PM EST ----- His heart strong. He has leakiness of his aortic valve. This is moderate. Will repeat echo in one year. cdm

## 2018-09-15 ENCOUNTER — Encounter: Payer: Self-pay | Admitting: Cardiovascular Disease

## 2018-09-15 ENCOUNTER — Ambulatory Visit: Payer: PPO | Admitting: Cardiovascular Disease

## 2018-09-15 VITALS — BP 122/80 | HR 80 | Ht 71.5 in | Wt 151.0 lb

## 2018-09-15 DIAGNOSIS — E7800 Pure hypercholesterolemia, unspecified: Secondary | ICD-10-CM

## 2018-09-15 DIAGNOSIS — I2510 Atherosclerotic heart disease of native coronary artery without angina pectoris: Secondary | ICD-10-CM

## 2018-09-15 DIAGNOSIS — I351 Nonrheumatic aortic (valve) insufficiency: Secondary | ICD-10-CM | POA: Diagnosis not present

## 2018-09-15 DIAGNOSIS — I491 Atrial premature depolarization: Secondary | ICD-10-CM

## 2018-09-15 DIAGNOSIS — I6523 Occlusion and stenosis of bilateral carotid arteries: Secondary | ICD-10-CM | POA: Diagnosis not present

## 2018-09-15 NOTE — Progress Notes (Signed)
Chief Complaint  Patient presents with  . Follow-up    CAD   History of Present Illness: 83 yo male with history of CAD s/p 2 V CABG 1996 , DM, HLD, TIA and SVT here today for cardiac follow up. He is on ASA and Plavix for stroke prevention per primary care. Echo November 2014 with normal LV systolic function, mild AI. Carotid dopplers November 2014 with mild bilateral disease. Stress myoview May 2015 with no ischemia. Admitted to [facility] May 2015 with syncope/diarrhea and found to have C. Diff. He was admitted to [facility] in November 2016 and diagnosed with Parkinson's disease. Last cardiac cath in March 2018 in the setting of chest pain and this showed 70% proximal LAD stenosis with patent LIMA graft to LAD and Diagonal. Mild disease in RCA. The Circumflex was normal. LVEF was normal. Cardiac monitor September 2018 with sinus rhythm, PACs and blocked PACs. Echo 09/11/18 with LVEF=50-55%, mild LVH. Grade 1 diastolic dysfunction. Moderate AI.   He is here today for follow up. The patient denies any exertional chest pain, dyspnea, palpitations, lower extremity edema, orthopnea, PND, dizziness, near syncope or syncope. Rare sharp chest pains.   Primary Care Physician: Unk Pinto, MD   Past Medical History:  Diagnosis Date  . Anxiety   . Arthritis    "lower back; left ankle" (03/20/2014)  . ASTHMA   . Blood transfusion    "related to OHS, ulcers"  . CAD (coronary artery disease)    a. s/p 2V CABG 1996. b. Last cath 12/2016 -> patent grafts, med rx.  . CAD, ARTERY BYPASS GRAFT   . CKD (chronic kidney disease), stage II   . Colon polyps    adenomatous polyps  . COPD   . Diverticulosis   . Duodenal ulcer without hemorrhage or perforation 12/29/2011  . GASTROESOPHAGEAL REFLUX DISEASE   . HYPERLIPIDEMIA   . Insomnia   . 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 Memory loss   . Nephrolithiasis    "I've had over 320 kidney stones" (03/20/2014)  . NEPHROLITHIASIS, HX OF 03/12/2009  . Parkinsonism ([place])   .  Sleep apnea    "don't always wear it" (03/20/2014)  . SUPRAVENTRICULAR TACHYCARDIA, HX OF   . SVT (supraventricular tachycardia) ([place]) 03/12/2009  . TIA   . Ulcer     Past Surgical History:  Procedure Laterality Date  . ANKLE FRACTURE SURGERY Left ~ 2012  . CARDIAC CATHETERIZATION    . COLONOSCOPY    . CORONARY ARTERY BYPASS GRAFT  1996   CABG X2  . CYSTOSCOPY WITH URETEROSCOPY, STONE BASKETRY AND STENT PLACEMENT    . Cystourethroscopy with stent removal.    . EXCISIONAL HEMORRHOIDECTOMY    . GASTRECTOMY    . HIATAL HERNIA REPAIR    . IRRIGATION AND DEBRIDEMENT SEBACEOUS CYST     "off my back  . LEFT HEART CATH AND CORS/GRAFTS ANGIOGRAPHY N/A 12/23/2016   Procedure: Left Heart Cath and Cors/Grafts Angiography;  Surgeon: Burnell Blanks, MD;  Location: [facility];  Service: Cardiovascular;  Laterality: N/A;  . POLYPECTOMY    . PROSTATE SURGERY     "took the center of my prostate out"  . UPPER GASTROINTESTINAL ENDOSCOPY    . VAGOTOMY      Current Outpatient Medications  Medication Sig Dispense Refill  . ALPRAZolam (XANAX) 1 MG tablet Take 1/2 to 1 tablet 2 to 3 x/day ONLY if needed for Anxiety Attack and please try to limit to 5 days /week to  avoid addiction 90 tablet 0  . aspirin 81 MG tablet Take 81 mg by mouth daily.      . Cholecalciferol (VITAMIN D3) 2000 UNITS capsule Take 8,000 Units by mouth daily.     . clopidogrel (PLAVIX) 75 MG tablet TAKE 1 TABLET BY MOUTH ONCE DAILY 90 tablet 1  . fludrocortisone (FLORINEF) 0.1 MG tablet TAKE 1 TABLET BY MOUTH TWICE DAILY 180 tablet 1  . glucose blood (FREESTYLE LITE) test strip TEST BLOOD SUGAR ONCE DAILY. Dx-E11.22. 100 each 3  . Lancets (FREESTYLE) lancets 1 each by Other route as needed for other. Use as instructed    . Melatonin 5 MG CAPS Take 10 mg by mouth at bedtime.     . mirtazapine (REMERON) 15 MG tablet TAKE 1/2 TO 1 (ONE-HALF TO ONE) TABLET BY MOUTH ONE HOUR BEFORE SLEEP FOR APPETITE 30 tablet 2  .  neomycin-polymyxin b-dexamethasone (MAXITROL) 3.5-10000-0.1 SUSP 1 - 2 drops to affected Eye(s) 4 x /day as directed 10 mL 3  . nitroGLYCERIN (NITROSTAT) 0.4 MG SL tablet Sig: 1 tablet under tongue every 3 to 5 minutes as needed for Angina - Please Dispense # 2 bottles of # 25 tabs 50 tablet 6  . pantoprazole (PROTONIX) 40 MG tablet TAKE ONE TABLET BY MOUTH ONCE DAILY 90 tablet 3  . potassium citrate (UROCIT-K) 10 MEQ (1080 MG) SR tablet TAKE ONE TABLET BY MOUTH TWICE DAILY 180 tablet 1  . predniSONE (DELTASONE) 5 MG tablet Take 1 tablet 3 x /day or as directed 90 tablet 1  . prochlorperazine (COMPAZINE) 5 MG tablet TAKE 1-2 TABS THREE TIMES A DAY AS NEEDED FOR NAUSEA 50 tablet 0  . QUEtiapine (SEROQUEL) 25 MG tablet TAKE 1 TABLET BY MOUTH THREE TIMES DAILY 270 tablet 0  . simvastatin (ZOCOR) 80 MG tablet TAKE ONE TABLET BY MOUTH AT BEDTIME 90 tablet 1   No current facility-administered medications for this visit.     Allergies  Allergen Reactions  . Gabapentin Other (See Comments)    Severe confusion  . Prozac [Fluoxetine Hcl] Other (See Comments)    Patient state that it does not agree with him  . Soma [Carisoprodol] Other (See Comments)    Patient stated that it does not agree with him  . Beta Adrenergic Blockers Other (See Comments)    REACTION: Bradycardia    Social History   Socioeconomic History  . Marital status: Married    Spouse name: Not on file  . Number of children: 2  . Years of education: 49  . Highest education level: Not on file  Occupational History  . Occupation: Programme researcher, broadcasting/film/video at [employer]: [place]  . Financial resource strain: Not on file  . Food insecurity:    Worry: Not on file    Inability: Not on file  . Transportation needs:    Medical: Not on file    Non-medical: Not on file  Tobacco Use  . Smoking status: Former Smoker    Packs/day: 0.50    Types: Cigarettes    Last attempt to quit: 10/25/1962    Years since  quitting: 55.9  . Smokeless tobacco: Former Systems developer    Types: [place] date: 04/15/2018  Substance and Sexual Activity  . Alcohol use: No    Alcohol/week: 0.0 standard drinks  . Drug use: No  . Sexual activity: Yes  Lifestyle  . Physical activity:    Days per week: Not on file  Minutes per session: Not on file  . Stress: Not on file  Relationships  . Social connections:    Talks on phone: Not on file    Gets together: Not on file    Attends religious service: Not on file    Active member of club or organization: Not on file    Attends meetings of clubs or organizations: Not on file    Relationship status: Not on file  . Intimate partner violence:    Fear of current or ex partner: Not on file    Emotionally abused: Not on file    Physically abused: Not on file    Forced sexual activity: Not on file  Other Topics Concern  . Not on file  Social History Narrative   Denies caffeine use     Family History  Problem Relation Age of Onset  . Alcohol abuse Father   . Stroke Mother   . Heart disease Brother   . Diabetes Brother   . Heart disease Paternal Grandmother   . Colon cancer Neg Hx   . Stomach cancer Neg Hx     Review of Systems:  As stated in the HPI and otherwise negative.   BP 122/80   Pulse 80   Ht 5' 11.5" (1.816 m)   Wt 151 lb (68.5 kg)   SpO2 96%   BMI 20.77 kg/m   Physical Examination:  General: Well developed, well nourished, NAD  HEENT: OP clear, mucus membranes moist  SKIN: warm, dry. No rashes. Neuro: No focal deficits  Musculoskeletal: Muscle strength 5/5 all ext  Psychiatric: Mood and affect normal  Neck: No JVD, no carotid bruits, no thyromegaly, no lymphadenopathy.  Lungs:Clear bilaterally, no wheezes, rhonci, crackles Cardiovascular: Regular rate and rhythm. No murmurs, gallops or rubs. Abdomen:Soft. Bowel sounds present. Non-tender.  Extremities: No lower extremity edema. Pulses are 2 + in the bilateral DP/PT.  Cardiac cath  12/23/16:  Prox LAD to Mid LAD lesion, 70 %stenosed.  Prox RCA lesion, 30 %stenosed.  Mid RCA lesion, 20 %stenosed.  LIMA graft was visualized by angiography and is normal in caliber and anatomically normal.  The left ventricular systolic function is normal.  LV end diastolic pressure is normal.  The left ventricular ejection fraction is 55-65% by visual estimate.  There is no mitral valve regurgitation.   1. Single vessel CAD s/p 2V CABG 2. Moderately severe stenosis mid LAD with patent LIMA to LAD and Diagonal. There is antegrade flow down the LAD and competitive flow from the graft.  3. Mild disease in the RCA 4. Normal LV systolic function Coronary Diagrams   Diagnostic Diagram          Echo 09/11/18: Left ventricle: The cavity size was normal. There was mild   concentric hypertrophy. Systolic function was normal. The   estimated ejection fraction was in the range of 50% to 55%. Wall   motion was normal; there were no regional wall motion   abnormalities. Doppler parameters are consistent with abnormal   left ventricular relaxation (grade 1 diastolic dysfunction). - Aortic valve: There was moderate regurgitation. - Mitral valve: Calcified annulus. Mildly thickened leaflets . - Left atrium: The atrium was mildly dilated. - Right ventricle: The cavity size was mildly dilated. Wall   thickness was normal. Systolic function was normal. - Pulmonary arteries: Systolic pressure was within the normal   range. - Inferior vena cava: The vessel was normal in size. - Pericardium, extracardiac: There was no pericardial effusion.  Impressions:  -  Low normal LVEF, normal LV size. Moderate aortic regurgitation.  EKG:  EKG is not ordered today.  Recent Labs: 08/30/2018: ALT 21; BUN 18; Creat 1.38; Hemoglobin 13.8; Magnesium 1.8; Platelets 213; Potassium 4.3; Sodium 141; TSH 2.18   Lipid Panel    Component Value Date/Time   CHOL 154 08/30/2018 1503   TRIG 60 08/30/2018  1503   HDL 71 08/30/2018 1503   CHOLHDL 2.2 08/30/2018 1503   VLDL 16 05/03/2017 1520   LDLCALC 69 08/30/2018 1503     Wt Readings from Last 3 Encounters:  09/15/18 151 lb (68.5 kg)  09/05/18 152 lb 6.4 oz (69.1 kg)  08/30/18 153 lb 9.6 oz (69.7 kg)    Other studies Reviewed: Additional studies/ records that were reviewed today include:. Review of the above records demonstrates:    Assessment and Plan:   1.CAD without angina: Cardiac cath March 2018 with stable CAD, patent LIMA to LAD/Diagonal but also antegrade flow through the LAD with 70% proximal LAD stenosis. He has no chest pain. Continue ASA, Plavix and statin. He does not tolerate beta blockers due to fatigue.    2. HLD: Lipids followed in primary care. Continue statin.   3. Carotid artery disease: He is known to have mild carotid artery disease by dopplers in 2016. Will repeat now.   4. PACs/PVCs: He has no palpitations.    5. Aortic valve insufficiency: Moderate by echo November 2019. Will repeat echo in one year.   Current medicines are reviewed at length with the patient today.  The patient does not have concerns regarding medicines.  The following changes have been made:  no change  Labs/ tests ordered today include: None  Orders Placed This Encounter  Procedures  . ECHOCARDIOGRAM COMPLETE    Disposition:   FU with me in 12 months.   Signed, Lauree Chandler, MD 09/15/2018 2:45 PM    [facility] [address], [place], [place]  [address] Phone: [phone]; Fax: [phone]

## 2018-09-15 NOTE — Telephone Encounter (Signed)
Dr. Angelena Form reviewed with pt at office visit on 09/15/18

## 2018-09-15 NOTE — Patient Instructions (Signed)
Medication Instructions:  Your physician recommends that you continue on your current medications as directed. Please refer to the Current Medication list given to you today.  If you need a refill on your cardiac medications before your next appointment, please call your pharmacy.   Lab work: none If you have labs (blood work) drawn today and your tests are completely normal, you will receive your results only by: [name] MyChart Message (if you have MyChart) OR . A paper copy in the mail If you have any lab test that is abnormal or we need to change your treatment, we will call you to review the results.  Testing/Procedures: Your physician has requested that you have a carotid duplex. This test is an ultrasound of the carotid arteries in your neck. It looks at blood flow through these arteries that supply the brain with blood. Allow one hour for this exam. There are no restrictions or special instructions.  Your physician has requested that you have an echocardiogram. Echocardiography is a painless test that uses sound waves to create images of your heart. It provides your doctor with information about the size and shape of your heart and how well your heart's chambers and valves are working. This procedure takes approximately one hour. There are no restrictions for this procedure. To be done in November 2020  Follow-Up: At [facility], you and your health needs are our priority.  As part of our continuing mission to provide you with exceptional heart care, we have created designated Provider Care Teams.  These Care Teams include your primary Cardiologist (physician) and Advanced Practice Providers (APPs -  Physician Assistants and Nurse Practitioners) who all work together to provide you with the care you need, when you need it. You will need a follow up appointment in 12 months. (after echocardiogram) Please call our office 2 months in advance to schedule this appointment.  You may see  Lauree Chandler, MD or one of the following Advanced Practice Providers on your designated Care Team:   [place], PA-C Melina Copa, PA-C . Ermalinda Barrios, PA-C  Any Other Special Instructions Will Be Listed Below (If Applicable).

## 2018-09-20 DIAGNOSIS — H401431 Capsular glaucoma with pseudoexfoliation of lens, bilateral, mild stage: Secondary | ICD-10-CM | POA: Diagnosis not present

## 2018-09-20 DIAGNOSIS — E119 Type 2 diabetes mellitus without complications: Secondary | ICD-10-CM | POA: Diagnosis not present

## 2018-09-20 DIAGNOSIS — Z961 Presence of intraocular lens: Secondary | ICD-10-CM | POA: Diagnosis not present

## 2018-09-20 DIAGNOSIS — H524 Presbyopia: Secondary | ICD-10-CM | POA: Diagnosis not present

## 2018-09-20 LAB — HM DIABETES EYE EXAM

## 2018-09-25 ENCOUNTER — Encounter: Payer: Self-pay | Admitting: *Deleted

## 2018-09-26 ENCOUNTER — Ambulatory Visit (HOSPITAL_COMMUNITY)
Admission: RE | Admit: 2018-09-26 | Discharge: 2018-09-26 | Disposition: A | Discharge status: Home / Self Care | Payer: PPO | Source: Ambulatory Visit | Attending: Cardiovascular Disease | Admitting: Cardiovascular Disease

## 2018-09-26 DIAGNOSIS — I6523 Occlusion and stenosis of bilateral carotid arteries: Secondary | ICD-10-CM

## 2018-10-10 ENCOUNTER — Other Ambulatory Visit: Payer: Self-pay | Admitting: Internal Medicine

## 2018-10-10 DIAGNOSIS — F419 Anxiety disorder, unspecified: Secondary | ICD-10-CM

## 2018-10-10 MED ORDER — ALPRAZOLAM 1 MG PO TABS: ORAL_TABLET | ORAL | 0 refills | Status: DC

## 2018-10-29 ENCOUNTER — Encounter: Payer: Self-pay | Admitting: Gastroenterology

## 2018-11-24 ENCOUNTER — Encounter: Payer: Self-pay | Admitting: Adult Health Nurse Practitioner

## 2018-11-24 ENCOUNTER — Encounter (INDEPENDENT_AMBULATORY_CARE_PROVIDER_SITE_OTHER): Payer: Self-pay | Admitting: Family Medicine

## 2018-11-24 ENCOUNTER — Ambulatory Visit (INDEPENDENT_AMBULATORY_CARE_PROVIDER_SITE_OTHER): Payer: PPO | Admitting: Adult Health Nurse Practitioner

## 2018-11-24 ENCOUNTER — Ambulatory Visit (INDEPENDENT_AMBULATORY_CARE_PROVIDER_SITE_OTHER): Payer: PPO | Admitting: Family Medicine

## 2018-11-24 DIAGNOSIS — M25562 Pain in left knee: Secondary | ICD-10-CM

## 2018-11-24 MED ORDER — METHYLPREDNISOLONE ACETATE 40 MG/ML IJ SUSP
40.0000 mg | INTRAMUSCULAR | Status: AC | PRN
  Administered 2018-11-24: 40 mg via INTRA_ARTICULAR

## 2018-11-24 MED ORDER — LIDOCAINE HCL 1 % IJ SOLN
5.0000 mL | INTRAMUSCULAR | Status: AC | PRN
  Administered 2018-11-24: 5 mL

## 2018-11-24 NOTE — Progress Notes (Signed)
Assessment and Plan:  Diagnoses and all orders for this visit:  Acute pain of left knee Concern for osteoarthritis vs MCL, PCL tear? Concerned for patient safety related to weight bearing status and impaired cognition.   Referral to Ortho Discussed safety with patient and daughter Consider using walker/cane while in home until evaluation for extra stability.  Will provider further treatment if unable to be seen prior to weekend.   Further disposition pending results of labs. Discussed med's effects and SE's.   Over 30 minutes of exam, counseling, chart review, and critical decision making was performed.   Future Appointments  Date Time Provider [place]  11/24/2018  3:20 PM Eunice Blase, MD [facility] None  12/18/2018 11:00 AM Unk Pinto, MD [facility] None  09/10/2019  1:30 PM Ward Givens, NP [facility] None  09/13/2019  2:00 PM [facility] ECHO 3 [id number]    ------------------------------------------------------------------------------------------------------------------   HPI 83 y.o.male presents for left knee pain.  He is accompanied by his daughter [name] today. Reports that the pain started about one week ago. The pain is when he stands or walks and is worse when walking or raising to walk from a chair.  He reports pain in the back of knee above and below the middle.  It is a constant pain that radiates above and below the back of the left knee.  Reports that resting makes the pain better but it does not go away but it does not keep him from sleep at night.  He feels like his knee is going to give way.  This happened a couple days ago while at church.  He had a near miss fall, he was able to catch himself on the church pew.  Then again this morning when he was leaving the house his knee gave way and he fell into the yard.  Denies any injury from this. Denies any knee pain in the past or any known trauma.  He does have mild dementia and has a history of  multiple falls in the the past. He does live alone and has family that checks on him frequently. Reports he has taken tylenol last night and this helped some as he was able to take a nap.  He currently does not use any assistive devices for his mobility.  Past Medical History:  Diagnosis Date  . Anxiety   . Arthritis    "lower back; left ankle" (03/20/2014)  . ASTHMA   . Blood transfusion    "related to OHS, ulcers"  . CAD (coronary artery disease)    a. s/p 2V CABG 1996. b. Last cath 12/2016 -> patent grafts, med rx.  . CAD, ARTERY BYPASS GRAFT   . CKD (chronic kidney disease), stage II   . Colon polyps    adenomatous polyps  . COPD   . Diverticulosis   . Duodenal ulcer without hemorrhage or perforation 12/29/2011  . GASTROESOPHAGEAL REFLUX DISEASE   . HYPERLIPIDEMIA   . Insomnia   . 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 Memory loss   . Nephrolithiasis    "I've had over 320 kidney stones" (03/20/2014)  . NEPHROLITHIASIS, HX OF 03/12/2009  . Parkinsonism ([place])   . Sleep apnea    "don't always wear it" (03/20/2014)  . SUPRAVENTRICULAR TACHYCARDIA, HX OF   . SVT (supraventricular tachycardia) ([place]) 03/12/2009  . TIA   . Ulcer      Allergies  Allergen Reactions  . Gabapentin Other (See Comments)    Severe confusion  . Prozac [  Fluoxetine Hcl] Other (See Comments)    Patient state that it does not agree with him  . Soma [Carisoprodol] Other (See Comments)    Patient stated that it does not agree with him  . Beta Adrenergic Blockers Other (See Comments)    REACTION: Bradycardia    Current Outpatient Medications on File Prior to Visit  Medication Sig  . ALPRAZolam (XANAX) 1 MG tablet Take 1/2 to 1 tablet 2 to 3 x/day ONLY if needed for Anxiety Attack and please try to limit to 5 days /week to avoid addiction  . aspirin 81 MG tablet Take 81 mg by mouth daily.    . Cholecalciferol (VITAMIN D3) 2000 UNITS capsule Take 8,000 Units by mouth daily.   . clopidogrel (PLAVIX) 75 MG tablet TAKE 1  TABLET BY MOUTH ONCE DAILY  . fludrocortisone (FLORINEF) 0.1 MG tablet TAKE 1 TABLET BY MOUTH TWICE DAILY  . glucose blood (FREESTYLE LITE) test strip TEST BLOOD SUGAR ONCE DAILY. Dx-E11.22.  [name] Lancets (FREESTYLE) lancets 1 each by Other route as needed for other. Use as instructed  . Melatonin 5 MG CAPS Take 10 mg by mouth at bedtime.   . mirtazapine (REMERON) 15 MG tablet TAKE 1/2 TO 1 (ONE-HALF TO ONE) TABLET BY MOUTH ONE HOUR BEFORE SLEEP FOR APPETITE  . neomycin-polymyxin b-dexamethasone (MAXITROL) 3.5-10000-0.1 SUSP 1 - 2 drops to affected Eye(s) 4 x /day as directed  . nitroGLYCERIN (NITROSTAT) 0.4 MG SL tablet Sig: 1 tablet under tongue every 3 to 5 minutes as needed for Angina - Please Dispense # 2 bottles of # 25 tabs  . pantoprazole (PROTONIX) 40 MG tablet TAKE ONE TABLET BY MOUTH ONCE DAILY  . potassium citrate (UROCIT-K) 10 MEQ (1080 MG) SR tablet TAKE ONE TABLET BY MOUTH TWICE DAILY  . predniSONE (DELTASONE) 5 MG tablet Take 1 tablet 3 x /day or as directed  . prochlorperazine (COMPAZINE) 5 MG tablet TAKE 1-2 TABS THREE TIMES A DAY AS NEEDED FOR NAUSEA  . QUEtiapine (SEROQUEL) 25 MG tablet TAKE 1 TABLET BY MOUTH THREE TIMES DAILY  . simvastatin (ZOCOR) 80 MG tablet TAKE ONE TABLET BY MOUTH AT BEDTIME   No current facility-administered medications on file prior to visit.     ROS: all negative except above.   Physical Exam:  There were no vitals taken for this visit.  General Appearance: Well nourished, in no apparent distress. Respiratory: Respiratory effort normal, BS equal bilaterally without rales, rhonchi, wheezing or stridor.  Cardio: RRR with no MRGs. Brisk peripheral pulses without edema.  Abdomen: Soft, + BS.  Non tender, no guarding, rebound, hernias, masses. Lymphatics: Non tender without lymphadenopathy.  Musculoskeletal: Full ROM, 5/5 strength, antalgic gait 25% weight bearing with guarding.  Edema to left MCL, tender to palpation. Pain with Valgus stress.   Negative posterior & anterior drawer test. Skin: Warm, dry without rashes, lesions, ecchymosis.  Neuro: Cranial nerves intact. Normal muscle tone, no cerebellar symptoms. Sensation intact.  Psych: Awake and oriented X 2, normal affect, Insight and Judgment appropriate.      Garnet Sierras, NP 11:36 AM [facility]

## 2018-11-24 NOTE — Progress Notes (Signed)
Office Visit Note   Patient: John [name]           Date of Birth: January 13, 1935           MRN: [id number] Visit Date: 11/24/2018 Requested by: Unk Pinto, [address] [place], [place] [address] PCP: Unk Pinto, MD  Subjective: Chief Complaint  Patient presents with  . Left Leg - Pain    Pain posterior leg, upper leg to mid calf x 1 week.  Leg has given way 3 times, most recently this morning.     HPI: He is an 84 year old with left leg pain.  Symptoms started about a week ago.  Intermittent pain when walking, his leg will suddenly feel like he is going to give way.  He gets a sharp stabbing pain near the posterior lateral aspect of the knee.  Denies any low back pain with this, denies any numbness in his leg.  No significant pain at rest.              ROS: He has hypertension, COPD, chronic kidney disease and diabetes.  Other systems were negative as pertains to the chief complaint.   Objective: Vital Signs: There were no vitals taken for this visit.  Physical Exam:  Left knee: Trace effusion, no warmth or erythema.  No pain with internal hip rotation.  Full flexion and extension of the knee but exquisite tenderness on the posterior lateral joint line, pain and a palpable click with McMurray's.  Imaging: None today.  Assessment & Plan: 1.  Left knee pain suspicious for degenerative lateral meniscus tear -Discussed options with patient and he wants to try cortisone injection, he has responded well to these in the past.  We will also try a hinged knee brace for support.  MRI if fails to improve.   Follow-Up Instructions: No follow-ups on file.      Procedures: Large Joint Inj on 11/24/2018 4:00 PM Indications: pain Details: 25 G 1.5 in needle, superolateral approach Medications: 5 mL lidocaine 1 %; 40 mg methylPREDNISolone acetate 40 MG/ML Aspirate: clear and yellow Consent was given by the patient.      No notes on file    PMFS  History: Patient Active Problem List   Diagnosis Date Noted  . Insomnia 02/19/2018  . Diabetic peripheral neuropathy ([place]) 11/21/2017  . BMI 21.0-21.9, adult 09/04/2015  . Esophageal reflux 07/09/2015  . Autonomic postural hypotension 02/06/2015  . CKD stage G3a/A1, GFR 45-59 and albumin creatinine ratio <30 mg/g (HCC) 03/20/2014  . Medication management 02/06/2014  . Vitamin D deficiency 02/06/2014  . Gastric AV malformation 10/30/2012  . History of colonic polyps 09/14/2011  . Mixed hyperlipidemia 03/12/2009  . Essential hypertension 03/12/2009  . ASCAD/CABG 03/12/2009  . History of TIA (transient ischemic attack) 03/12/2009  . ASTHMA 03/12/2009  . COPD (chronic obstructive pulmonary disease) ([place]) 03/12/2009  . GERD 03/12/2009   Past Medical History:  Diagnosis Date  . Anxiety   . Arthritis    "lower back; left ankle" (03/20/2014)  . ASTHMA   . Blood transfusion    "related to OHS, ulcers"  . CAD (coronary artery disease)    a. s/p 2V CABG 1996. b. Last cath 12/2016 -> patent grafts, med rx.  . CAD, ARTERY BYPASS GRAFT   . CKD (chronic kidney disease), stage II   . Colon polyps    adenomatous polyps  . COPD   . Diverticulosis   . Duodenal ulcer without hemorrhage or perforation 12/29/2011  .  GASTROESOPHAGEAL REFLUX DISEASE   . HYPERLIPIDEMIA   . Insomnia   . 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 Memory loss   . Nephrolithiasis    "I've had over 320 kidney stones" (03/20/2014)  . NEPHROLITHIASIS, HX OF 03/12/2009  . Parkinsonism ([place])   . Sleep apnea    "don't always wear it" (03/20/2014)  . SUPRAVENTRICULAR TACHYCARDIA, HX OF   . SVT (supraventricular tachycardia) ([place]) 03/12/2009  . TIA   . Ulcer     Family History  Problem Relation Age of Onset  . Alcohol abuse Father   . Stroke Mother   . Heart disease Brother   . Diabetes Brother   . Heart disease Paternal Grandmother   . Colon cancer Neg Hx   . Stomach cancer Neg Hx     Past Surgical History:  Procedure Laterality  Date  . ANKLE FRACTURE SURGERY Left ~ 2012  . CARDIAC CATHETERIZATION    . COLONOSCOPY    . CORONARY ARTERY BYPASS GRAFT  1996   CABG X2  . CYSTOSCOPY WITH URETEROSCOPY, STONE BASKETRY AND STENT PLACEMENT    . Cystourethroscopy with stent removal.    . EXCISIONAL HEMORRHOIDECTOMY    . GASTRECTOMY    . HIATAL HERNIA REPAIR    . IRRIGATION AND DEBRIDEMENT SEBACEOUS CYST     "off my back  . LEFT HEART CATH AND CORS/GRAFTS ANGIOGRAPHY N/A 12/23/2016   Procedure: Left Heart Cath and Cors/Grafts Angiography;  Surgeon: Burnell Blanks, MD;  Location: [facility];  Service: Cardiovascular;  Laterality: N/A;  . POLYPECTOMY    . PROSTATE SURGERY     "took the center of my prostate out"  . UPPER GASTROINTESTINAL ENDOSCOPY    . VAGOTOMY     Social History   Occupational History  . 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 Smoking status: Former Smoker    Packs/day: 0.50    Types: Cigarettes    Last attempt to quit: 10/25/1962    Years since quitting: 56.1  . Smokeless tobacco: Former Systems developer    Types: [place] date: 04/15/2018  Substance and Sexual Activity  . Alcohol use: No    Alcohol/week: 0.0 standard drinks  . Drug use: No  . Sexual activity: Yes

## 2018-12-17 ENCOUNTER — Encounter: Payer: Self-pay | Admitting: Internal Medicine

## 2018-12-17 NOTE — Patient Instructions (Signed)

## 2018-12-17 NOTE — Progress Notes (Signed)
[facility]   Unk Pinto, M.D.     Uvaldo Bristle. Silverio Lay, P.A.-C Liane Comber, [address]                [place] [address] Telephone [phone] Telefax [phone] Annual  Screening/Preventative Visit  & Comprehensive Evaluation & Examination     This very nice 84 y.o. WWM presents for a Screening /Preventative Visit & comprehensive evaluation and management of multiple medical co-morbidities.  Patient has been followed for HTN / postural Hypotension, ASCAD s/p CABG, LD, T2_NIDDM  Prediabetes and Vitamin D Deficiency. 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 Patient is followed by Dr Dutch Gray for long hx/o recurrent Kidney Stones  (approximating over 200).  Patient also has GERD controlled on his meds.       Patient has hx/o recurrent polyposis and was due for 5 yr f/u in Oct  2019 which was deferred due to his wife's pre terminal status (she deceased in 26-Sep-2023). Patient has lost 11 # weight in the last year from 160# in Jan 2019. He is on Remeron 15 mg and low dose Prednisone 5 mg qod for appetite.  Wt Readings from Last 3 Encounters:  12/18/18 149 lb 3.2 oz (67.7 kg)  09/15/18 151 lb (68.5 kg)  09/05/18 152 lb 6.4 oz (69.1 kg)      HTN predates since  The 1960's. Patient's BP has been labile complicated by Postural Hypotension felt a component of his Diabetic Autonomic Neuropathy. He takes Florinef 1-2 tabs /day. In 1996, he underwent CABG x 2 V.   Today's BP is 108/56. Patient denies any cardiac symptoms as chest pain, palpitations, shortness of breath, dizziness or ankle swelling.     Patient's hyperlipidemia is controlled with diet and medications. Patient denies myalgias or other medication SE's. Last lipids were at goal: Lab Results  Component Value Date   CHOL 154 08/30/2018   HDL 71 08/30/2018   LDLCALC 69 08/30/2018   TRIG  60 08/30/2018   CHOLHDL 2.2 08/30/2018      Patient has hx/o T2_NIDDM (1995) and after moderate weight loss he was able to stop his Metformin by 2008.   Patient denies reactive hypoglycemic symptoms, visual blurring, diabetic polys, does have Diabetic Neuropathy with peripheral sensory deficits as well as his orthostatic Hypotension.  paresthesias. Last A1c was  Lab Results  Component Value Date   HGBA1C 5.4 05/24/2018       Finally, patient has history of Vitamin D Deficiency ("18" / 2008)  and last vitamin D was at goal: Lab Results  Component Value Date   VD25OH 66 05/24/2018   Current Outpatient Medications on File Prior to Visit  Medication Sig  . ALPRAZolam (XANAX) 1 MG tablet Take 1/2 to 1 tablet 2 to 3 x/day ONLY if needed for Anxiety Attack and please try to limit to 5 days /week to avoid addiction  . aspirin 81 MG tablet Take 81 mg by mouth daily.    . Cholecalciferol (VITAMIN D3) 2000 UNITS capsule Take 8,000 Units by mouth daily.   . clopidogrel (PLAVIX) 75 MG tablet TAKE 1 TABLET BY MOUTH ONCE DAILY  . fludrocortisone (FLORINEF) 0.1 MG tablet TAKE 1 TABLET BY MOUTH TWICE DAILY  . glucose blood (FREESTYLE LITE) test strip TEST BLOOD SUGAR ONCE DAILY. Dx-E11.22.  [name]  Lancets (FREESTYLE) lancets 1 each by Other route as needed for other. Use as instructed  . Melatonin 5 MG CAPS Take 10 mg by mouth at bedtime.   . mirtazapine (REMERON) 15 MG tablet TAKE 1/2 TO 1 (ONE-HALF TO ONE) TABLET BY MOUTH ONE HOUR BEFORE SLEEP FOR APPETITE  . neomycin-polymyxin b-dexamethasone (MAXITROL) 3.5-10000-0.1 SUSP 1 - 2 drops to affected Eye(s) 4 x /day as directed  . nitroGLYCERIN (NITROSTAT) 0.4 MG SL tablet Sig: 1 tablet under tongue every 3 to 5 minutes as needed for Angina - Please Dispense # 2 bottles of # 25 tabs  . pantoprazole (PROTONIX) 40 MG tablet TAKE ONE TABLET BY MOUTH ONCE DAILY  . potassium citrate (UROCIT-K) 10 MEQ (1080 MG) SR tablet TAKE ONE TABLET BY MOUTH TWICE DAILY  .  predniSONE (DELTASONE) 5 MG tablet Take 1 tablet 3 x /day or as directed  . prochlorperazine (COMPAZINE) 5 MG tablet TAKE 1-2 TABS THREE TIMES A DAY AS NEEDED FOR NAUSEA  . QUEtiapine (SEROQUEL) 25 MG tablet TAKE 1 TABLET BY MOUTH THREE TIMES DAILY  . simvastatin (ZOCOR) 80 MG tablet TAKE ONE TABLET BY MOUTH AT BEDTIME   No current facility-administered medications on file prior to visit.    Allergies  Allergen Reactions  . Gabapentin Other (See Comments)    Severe confusion  . Prozac [Fluoxetine Hcl] Other (See Comments)    Patient state that it does not agree with him  . Soma [Carisoprodol] Other (See Comments)    Patient stated that it does not agree with him  . Beta Adrenergic Blockers Other (See Comments)    REACTION: Bradycardia   Past Medical History:  Diagnosis Date  . Anxiety   . Arthritis    "lower back; left ankle" (03/20/2014)  . ASTHMA   . Blood transfusion    "related to OHS, ulcers"  . CAD (coronary artery disease)    a. s/p 2V CABG 1996. b. Last cath 12/2016 -> patent grafts, med rx.  . CAD, ARTERY BYPASS GRAFT   . CKD (chronic kidney disease), stage II   . Colon polyps    adenomatous polyps  . COPD   . Diverticulosis   . Duodenal ulcer without hemorrhage or perforation 12/29/2011  . GASTROESOPHAGEAL REFLUX DISEASE   . HYPERLIPIDEMIA   . Insomnia   . 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 Memory loss   . Nephrolithiasis    "I've had over 320 kidney stones" (03/20/2014)  . NEPHROLITHIASIS, HX OF 03/12/2009  . Parkinsonism ([place])   . Sleep apnea    "don't always wear it" (03/20/2014)  . SUPRAVENTRICULAR TACHYCARDIA, HX OF   . SVT (supraventricular tachycardia) ([place]) 03/12/2009  . TIA   . Ulcer    Health Maintenance  Topic Date Due  . COLONOSCOPY  09/06/2018  . URINE MICROALBUMIN  11/21/2018  . HEMOGLOBIN A1C  11/24/2018  . OPHTHALMOLOGY EXAM  09/21/2019  . FOOT EXAM  12/18/2019  . TETANUS/TDAP  08/02/2023  . INFLUENZA VACCINE  Completed  . PNA vac Low Risk Adult   Completed   Immunization History  Administered Date(s) Administered  . Influenza, High Dose Seasonal PF 08/13/2013, 08/05/2014, 07/23/2015, 06/22/2016, 07/18/2017, 07/17/2018  . Pneumococcal Conjugate-13 08/27/2014  . Pneumococcal Polysaccharide-23 08/06/2012  . Tdap 08/01/2013  . Zoster 05/02/2006   Last Colon - 09/06/2013 - Dr Deatra Ina - 45 polyps - Recc 5 year f/u overdue 10-04-2018 (Wife died 17-Aug-2018)   Past Surgical History:  Procedure Laterality Date  . ANKLE FRACTURE SURGERY Left ~  2012  . CARDIAC CATHETERIZATION    . COLONOSCOPY    . CORONARY ARTERY BYPASS GRAFT  1996   CABG X2  . CYSTOSCOPY WITH URETEROSCOPY, STONE BASKETRY AND STENT PLACEMENT    . Cystourethroscopy with stent removal.    . EXCISIONAL HEMORRHOIDECTOMY    . GASTRECTOMY    . HIATAL HERNIA REPAIR    . IRRIGATION AND DEBRIDEMENT SEBACEOUS CYST     "off my back  . LEFT HEART CATH AND CORS/GRAFTS ANGIOGRAPHY N/A 12/23/2016   Procedure: Left Heart Cath and Cors/Grafts Angiography;  Surgeon: Burnell Blanks, MD;  Location: [facility];  Service: Cardiovascular;  Laterality: N/A;  . POLYPECTOMY    . PROSTATE SURGERY     "took the center of my prostate out"  . UPPER GASTROINTESTINAL ENDOSCOPY    . VAGOTOMY     Family History  Problem Relation Age of Onset  . Alcohol abuse Father   . Stroke Mother   . Heart disease Brother   . Diabetes Brother   . Heart disease Paternal Grandmother   . Colon cancer Neg Hx   . Stomach cancer Neg Hx    Social History   Socioeconomic History  . Marital status: Married    Spouse name: Deceased  . Number of children: 2  . Years of education: 14  Occupational History  . 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 Smoking status: Former Smoker    Packs/day: 0.50    Types: Cigarettes    Last attempt to quit: 10/25/1962    Years since quitting: 56.1  . Smokeless tobacco: Former Systems developer    Types: [place] date:  04/15/2018  Substance and Sexual Activity  . Alcohol use: No    Alcohol/week: 0.0 standard drinks  . Drug use: No  . Sexual activity: Yes  Social History Narrative   Denies caffeine use     ROS Constitutional: Denies fever, chills, weight loss/gain, headaches, insomnia,  night sweats or change in appetite. Does c/o fatigue. Eyes: Denies redness, blurred vision, diplopia, discharge, itchy or watery eyes.  ENT: Denies discharge, congestion, post nasal drip, epistaxis, sore throat, earache, hearing loss, dental pain, Tinnitus, Vertigo, Sinus pain or snoring.  Cardio: Denies chest pain, palpitations, irregular heartbeat, syncope, dyspnea, diaphoresis, orthopnea, PND, claudication or edema Respiratory: denies cough, dyspnea, DOE, pleurisy, hoarseness, laryngitis or wheezing.  Gastrointestinal: Denies dysphagia, heartburn, reflux, water brash, pain, cramps, nausea, vomiting, bloating, diarrhea, constipation, hematemesis, melena, hematochezia, jaundice or hemorrhoids Genitourinary: Denies dysuria, frequency, discharge, hematuria or flank pain. Has urgency, nocturia x 2-3 & occasional hesitancy. Musculoskeletal: Denies arthralgia, myalgia, stiffness, Jt. Swelling, pain, limp or strain/sprain. Denies Falls. Skin: Denies puritis, rash, hives, warts, acne, eczema or change in skin lesion Neuro: No weakness, tremor, incoordination, spasms, paresthesia or pain Psychiatric: Denies confusion, memory loss or sensory loss. Denies Depression. Endocrine: Denies change in weight, skin, hair change, nocturia, and paresthesia, diabetic polys, visual blurring or hyper / hypo glycemic episodes.  Heme/Lymph: No excessive bleeding, bruising or enlarged lymph nodes.  Physical Exam  BP (!) 108/56   Pulse 88   Temp 97.6 F (36.4 C)   Resp 16   Ht 5\' 11"  (1.803 m)   Wt 149 lb 3.2 oz (67.7 kg)   BMI 20.81 kg/m   General Appearance: Well nourished and well groomed and in no apparent distress.  Eyes: PERRLA,  EOMs, conjunctiva no swelling or erythema, normal fundi and vessels. Sinuses:  No frontal/maxillary tenderness ENT/Mouth: EACs patent / TMs  nl. Nares clear without erythema, swelling, mucoid exudates. Oral hygiene is good. No erythema, swelling, or exudate. Tongue normal, non-obstructing. Tonsils not swollen or erythematous. Hearing normal.  Neck: Supple, thyroid not palpable. No bruits, nodes or JVD. Respiratory: Respiratory effort normal.  BS equal and clear bilateral without rales, rhonci, wheezing or stridor. Cardio: Heart sounds are normal with regular rate and rhythm and no murmurs, rubs or gallops. Peripheral pulses are normal and equal bilaterally without edema. No aortic or femoral bruits. Chest: symmetric with normal excursions and percussion.  Abdomen: Soft, with Nl bowel sounds. Nontender, no guarding, rebound, hernias, masses, or organomegaly.  Lymphatics: Non tender without lymphadenopathy.  Musculoskeletal: Full ROM all peripheral extremities, joint stability, 5/5 strength, and normal gait. Skin: Warm and dry without rashes, lesions, cyanosis, clubbing or  ecchymosis.  Neuro: Cranial nerves intact, reflexes equal bilaterally. Normal muscle tone, no cerebellar symptoms. Sensation intact.  Pysch: Alert and oriented X 3 with normal affect, insight and judgment appropriate.   Assessment and Plan  1. Annual Preventative/Screening Exam   2. Essential hypertension  - EKG 12-Lead - [place], RETROPERITNL ABD,  LTD - Urinalysis, Routine w reflex microscopic - Microalbumin / creatinine urine ratio - CBC with Differential/Platelet - COMPLETE METABOLIC PANEL WITH GFR - Magnesium - TSH  3. Hyperlipidemia, mixed  - EKG 12-Lead - [place], RETROPERITNL ABD,  LTD - Lipid panel - TSH  4. Type 2 diabetes mellitus with stage 3 chronic kidney disease, without long-term current use of insulin (HCC)  - EKG 12-Lead - [place], RETROPERITNL ABD,  LTD - HM DIABETES FOOT EXAM - LOW EXTREMITY NEUR EXAM  DOCUM - Hemoglobin A1c - Insulin, random  5. Vitamin D deficiency  - VITAMIN D 25 Hydroxyl  6. Diabetic peripheral neuropathy (HCC)  - Hemoglobin A1c - Insulin, random  7. Autonomic postural hypotension  - COMPLETE METABOLIC PANEL WITH GFR - Hemoglobin A1c - Insulin, random  8. Atherosclerosis of coronary artery bypass graft of native heart without angina pectoris  - EKG 12-Lead - Lipid panel  9. History of TIA (transient ischemic attack)  10. Screening for ischemic heart disease  - EKG 12-Lead  11. FHx: heart disease  - EKG 12-Lead - [place], RETROPERITNL ABD,  LTD  12. Former smoker  - EKG 12-Lead - [place], RETROPERITNL ABD,  LTD  13. Screening for AAA (aortic abdominal aneurysm)  - [place], RETROPERITNL ABD,  LTD  14. Screening for colorectal cancer  - Ambulatory referral to Gastroenterology - POC Hemoccult Bld/Stl   15. Multiple polyps of sigmoid colon  - Ambulatory referral to Gastroenterology - POC Hemoccult Bld/Stl  16. BPH with obstruction/lower urinary tract symptoms  - PSA  17. Prostate cancer screening  - PSA  18. Medication management  - Urinalysis, Routine w reflex microscopic - Microalbumin / creatinine urine ratio - CBC with Differential/Platelet - COMPLETE METABOLIC PANEL WITH GFR - Magnesium - Lipid panel - TSH - Hemoglobin A1c - Insulin, random - VITAMIN D 25 Hydroxyl            Patient was counseled in prudent diet, weight control to achieve/maintain BMI less than 25, BP monitoring, regular exercise and medications as discussed.  Discussed med effects and SE's. Routine screening labs and tests as requested with regular follow-up as recommended. Over 40 minutes of exam, counseling, chart review and high complex critical decision making was performed

## 2018-12-18 ENCOUNTER — Ambulatory Visit (INDEPENDENT_AMBULATORY_CARE_PROVIDER_SITE_OTHER): Payer: PPO | Admitting: Internal Medicine

## 2018-12-18 VITALS — BP 108/56 | HR 88 | Temp 97.6°F | Resp 16 | Ht 71.0 in | Wt 149.2 lb

## 2018-12-18 DIAGNOSIS — E782 Mixed hyperlipidemia: Secondary | ICD-10-CM | POA: Diagnosis not present

## 2018-12-18 DIAGNOSIS — Z0000 Encounter for general adult medical examination without abnormal findings: Secondary | ICD-10-CM | POA: Diagnosis not present

## 2018-12-18 DIAGNOSIS — Z8249 Family history of ischemic heart disease and other diseases of the circulatory system: Secondary | ICD-10-CM

## 2018-12-18 DIAGNOSIS — Z1211 Encounter for screening for malignant neoplasm of colon: Secondary | ICD-10-CM

## 2018-12-18 DIAGNOSIS — Z125 Encounter for screening for malignant neoplasm of prostate: Secondary | ICD-10-CM

## 2018-12-18 DIAGNOSIS — I25810 Atherosclerosis of coronary artery bypass graft(s) without angina pectoris: Secondary | ICD-10-CM | POA: Diagnosis not present

## 2018-12-18 DIAGNOSIS — E1122 Type 2 diabetes mellitus with diabetic chronic kidney disease: Secondary | ICD-10-CM

## 2018-12-18 DIAGNOSIS — N138 Other obstructive and reflux uropathy: Secondary | ICD-10-CM

## 2018-12-18 DIAGNOSIS — I10 Essential (primary) hypertension: Secondary | ICD-10-CM | POA: Diagnosis not present

## 2018-12-18 DIAGNOSIS — N183 Chronic kidney disease, stage 3 (moderate): Secondary | ICD-10-CM

## 2018-12-18 DIAGNOSIS — K635 Polyp of colon: Secondary | ICD-10-CM

## 2018-12-18 DIAGNOSIS — N401 Enlarged prostate with lower urinary tract symptoms: Secondary | ICD-10-CM

## 2018-12-18 DIAGNOSIS — E1142 Type 2 diabetes mellitus with diabetic polyneuropathy: Secondary | ICD-10-CM

## 2018-12-18 DIAGNOSIS — D125 Benign neoplasm of sigmoid colon: Secondary | ICD-10-CM

## 2018-12-18 DIAGNOSIS — F419 Anxiety disorder, unspecified: Secondary | ICD-10-CM

## 2018-12-18 DIAGNOSIS — Z79899 Other long term (current) drug therapy: Secondary | ICD-10-CM

## 2018-12-18 DIAGNOSIS — Z136 Encounter for screening for cardiovascular disorders: Secondary | ICD-10-CM | POA: Diagnosis not present

## 2018-12-18 DIAGNOSIS — Z8673 Personal history of transient ischemic attack (TIA), and cerebral infarction without residual deficits: Secondary | ICD-10-CM

## 2018-12-18 DIAGNOSIS — Z0001 Encounter for general adult medical examination with abnormal findings: Secondary | ICD-10-CM

## 2018-12-18 DIAGNOSIS — Z1212 Encounter for screening for malignant neoplasm of rectum: Secondary | ICD-10-CM

## 2018-12-18 DIAGNOSIS — E559 Vitamin D deficiency, unspecified: Secondary | ICD-10-CM

## 2018-12-18 DIAGNOSIS — I951 Orthostatic hypotension: Secondary | ICD-10-CM

## 2018-12-18 DIAGNOSIS — R634 Abnormal weight loss: Secondary | ICD-10-CM

## 2018-12-18 DIAGNOSIS — Z87891 Personal history of nicotine dependence: Secondary | ICD-10-CM

## 2018-12-18 MED ORDER — ALPRAZOLAM 1 MG PO TABS: ORAL_TABLET | ORAL | 0 refills | Status: DC

## 2018-12-18 MED ORDER — MIRTAZAPINE 30 MG PO TBDP: ORAL_TABLET | ORAL | 3 refills | Status: DC

## 2018-12-18 NOTE — Addendum Note (Signed)
Addended by: Unk Pinto on: 12/18/2018 01:24 PM   Modules accepted: Orders

## 2018-12-19 LAB — LIPID PANEL
Cholesterol: 176 mg/dL (ref <200)
HDL: 54 mg/dL (ref >=40)
LDL Cholesterol (Calc): 99 mg/dL (calc)
Non-HDL Cholesterol (Calc): 122 mg/dL (calc) (ref <130)
Total CHOL/HDL Ratio: 3.3 (calc) (ref <5.0)
Triglycerides: 126 mg/dL (ref <150)

## 2018-12-19 LAB — CBC WITH DIFFERENTIAL/PLATELET
Absolute Monocytes: 400 cells/uL (ref 200–950)
Basophils Absolute: 48 cells/uL (ref 0–200)
Basophils Relative: 1.1 %
EOS ABS: 158 {cells}/uL (ref 15–500)
Eosinophils Relative: 3.6 %
HCT: 42.2 % (ref 38.5–50.0)
Hemoglobin: 14.3 g/dL (ref 13.2–17.1)
Lymphs Abs: 1694 cells/uL (ref 850–3900)
MCH: 32.3 pg (ref 27.0–33.0)
MCHC: 33.9 g/dL (ref 32.0–36.0)
MCV: 95.3 fL (ref 80.0–100.0)
MONOS PCT: 9.1 %
MPV: 10 fL (ref 7.5–12.5)
Neutro Abs: 2099 cells/uL (ref 1500–7800)
Neutrophils Relative %: 47.7 %
Platelets: 247 10*3/uL (ref 140–400)
RBC: 4.43 10*6/uL (ref 4.20–5.80)
RDW: 12.6 % (ref 11.0–15.0)
Total Lymphocyte: 38.5 %
WBC: 4.4 10*3/uL (ref 3.8–10.8)

## 2018-12-19 LAB — COMPLETE METABOLIC PANEL WITH GFR
AG Ratio: 1.3 (calc) (ref 1.0–2.5)
ALKALINE PHOSPHATASE (APISO): 56 U/L (ref 35–144)
ALT: 18 U/L (ref 9–46)
AST: 18 U/L (ref 10–35)
Albumin: 3.7 g/dL (ref 3.6–5.1)
BUN/Creatinine Ratio: 11 (calc) (ref 6–22)
BUN: 17 mg/dL (ref 7–25)
CO2: 28 mmol/L (ref 20–32)
Calcium: 9.2 mg/dL (ref 8.6–10.3)
Chloride: 108 mmol/L (ref 98–110)
Creat: 1.49 mg/dL — ABNORMAL HIGH (ref 0.70–1.11)
GFR, Est African American: 50 mL/min/{1.73_m2} — ABNORMAL LOW (ref >=60)
GFR, Est Non African American: 43 mL/min/{1.73_m2} — ABNORMAL LOW (ref >=60)
Globulin: 2.9 g/dL (calc) (ref 1.9–3.7)
Glucose, Bld: 74 mg/dL (ref 65–99)
Potassium: 4.5 mmol/L (ref 3.5–5.3)
Sodium: 144 mmol/L (ref 135–146)
Total Bilirubin: 0.5 mg/dL (ref 0.2–1.2)
Total Protein: 6.6 g/dL (ref 6.1–8.1)

## 2018-12-19 LAB — TSH: TSH: 2.1 mIU/L (ref 0.40–4.50)

## 2018-12-19 LAB — HEMOGLOBIN A1C
HEMOGLOBIN A1C: 5.3 %{Hb} (ref <5.7)
MEAN PLASMA GLUCOSE: 105 (calc)
eAG (mmol/L): 5.8 (calc)

## 2018-12-19 LAB — PSA: PSA: 0.5 ng/mL (ref <=4.0)

## 2018-12-19 LAB — INSULIN, RANDOM: INSULIN: 4.3 u[IU]/mL

## 2018-12-19 LAB — MAGNESIUM: Magnesium: 1.6 mg/dL (ref 1.5–2.5)

## 2018-12-19 LAB — VITAMIN D 25 HYDROXY (VIT D DEFICIENCY, FRACTURES): Vit D, 25-Hydroxy: 60 ng/mL (ref 30–100)

## 2018-12-22 ENCOUNTER — Encounter: Payer: Self-pay | Admitting: Gastroenterology

## 2019-01-24 ENCOUNTER — Telehealth: Payer: Self-pay

## 2019-01-24 NOTE — Telephone Encounter (Signed)
-----   Message from Sim Boast sent at 01/23/2019 10:37 AM EDT ----- Regarding: Sorry  Hi Jan,   Are you sick of me yet? I am sorry to bother you again but I called this pt regarding his appt on 4/2. He is 47 and does not have a smartphone or any other device to do virtual visit. He is requesting a telephone call or in person visit. I told him that I was going to run it by the doctor just because of his sxs. Could you pls call him?  Thank you,  Tye [place]

## 2019-01-25 ENCOUNTER — Ambulatory Visit: Payer: PPO | Admitting: Gastroenterology

## 2019-01-25 ENCOUNTER — Other Ambulatory Visit: Payer: Self-pay

## 2019-01-25 ENCOUNTER — Ambulatory Visit (INDEPENDENT_AMBULATORY_CARE_PROVIDER_SITE_OTHER): Payer: PPO | Admitting: Gastroenterology

## 2019-01-25 ENCOUNTER — Encounter: Payer: Self-pay | Admitting: Gastroenterology

## 2019-01-25 DIAGNOSIS — Z7902 Long term (current) use of antithrombotics/antiplatelets: Secondary | ICD-10-CM | POA: Diagnosis not present

## 2019-01-25 DIAGNOSIS — K219 Gastro-esophageal reflux disease without esophagitis: Secondary | ICD-10-CM

## 2019-01-25 DIAGNOSIS — Z86010 Personal history of colonic polyps: Secondary | ICD-10-CM | POA: Diagnosis not present

## 2019-01-25 NOTE — Progress Notes (Signed)
THIS ENCOUNTER IS A VIRTUAL / TELEMEDICINE VISIT DUE TO COVID-19 - PATIENT WAS NOT SEEN IN THE OFFICE. PATIENT HAS CONSENTED TO VIRTUAL VISIT / TELEMEDICINE VISIT   Location of patient: home Location of provider: office Name of referring provider:  Persons participating: myself, patient Time spent on call: more than 20 minutes spent with patient and then time reviewing chart and coordinating care  HPI :  84 y/o male with a history of CAD s/p CABG, TIA, history of plavix use, history of C diff, history of multiple adenomas, here for a new patient visit with me to establish care. He previously saw Dr. Deatra Ina, last in 2015  He inquires about having a colonoscopy. He has some loose stools periodically. He is using an anti diarrheal OTC and he reports it works well for him, "anti-diarrhea - [facility]" - generic loperamide, he uses them about 3 days per week. He is using having about one per day usually, then a few days with some loose stools more frequently. No blood in the stools. No routine abdominal pains, occasional cramps if he has constipation. Weight is stable. He is eating well. He is on Remeron for appetite stimulation, weighs about 145 lbs. No nausea or vomiting. He is taking protonix once daily for history of reflux. Controls his reflux well. No dysphagia.   He takes Plavix and aspirin 81mg  / day. He has a history of CAD s/p bypass and history of TIA. No cardiopulmonary symptoms.   His last colonoscopy was in 2014 and one small polyp removed. However in 2013 he had 18 adenomas removed, largest > 1 cm in size. He was told to follow up in 5 years after his last exam. He reports he wants to have another colonoscopy.   Echo 08/2018 - EF 50-55%, moderate AS  Colonoscopy 09/06/2013 - diverticulosis, 4mm cecal polyp EGD 09/12/2012 - GAVE, GEJ stricture s/p Maloney dilation - GEJ and gastric biopsies benign Colonoscopy 09/12/2012 - 18 polyps, largest > 1 cm in right colon, mild proctitis - biopsies  without evidence of chronicity  Past Medical History:  Diagnosis Date  . Anxiety   . Arthritis    "lower back; left ankle" (03/20/2014)  . ASTHMA   . Blood transfusion    "related to OHS, ulcers"  . CAD (coronary artery disease)    a. s/p 2V CABG 1996. b. Last cath 12/2016 -> patent grafts, med rx.  . CAD, ARTERY BYPASS GRAFT   . CKD (chronic kidney disease), stage II   . Colon polyps    adenomatous polyps  . COPD   . Diverticulosis   . Duodenal ulcer without hemorrhage or perforation 12/29/2011  . GASTROESOPHAGEAL REFLUX DISEASE   . HYPERLIPIDEMIA   . Insomnia   . 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 Memory loss   . Nephrolithiasis    "I've had over 320 kidney stones" (03/20/2014)  . NEPHROLITHIASIS, HX OF 03/12/2009  . Parkinsonism ([place])   . Sleep apnea    "don't always wear it" (03/20/2014)  . SUPRAVENTRICULAR TACHYCARDIA, HX OF   . SVT (supraventricular tachycardia) ([place]) 03/12/2009  . TIA   . Ulcer      Past Surgical History:  Procedure Laterality Date  . ANKLE FRACTURE SURGERY Left ~ 2012  . CARDIAC CATHETERIZATION    . COLONOSCOPY    . CORONARY ARTERY BYPASS GRAFT  1996   CABG X2  . CYSTOSCOPY WITH URETEROSCOPY, STONE BASKETRY AND STENT PLACEMENT    . Cystourethroscopy with stent removal.    .  EXCISIONAL HEMORRHOIDECTOMY    . GASTRECTOMY    . HIATAL HERNIA REPAIR    . IRRIGATION AND DEBRIDEMENT SEBACEOUS CYST     "off my back  . LEFT HEART CATH AND CORS/GRAFTS ANGIOGRAPHY N/A 12/23/2016   Procedure: Left Heart Cath and Cors/Grafts Angiography;  Surgeon: Burnell Blanks, MD;  Location: [facility];  Service: Cardiovascular;  Laterality: N/A;  . POLYPECTOMY    . PROSTATE SURGERY     "took the center of my prostate out"  . UPPER GASTROINTESTINAL ENDOSCOPY    . VAGOTOMY     Family History  Problem Relation Age of Onset  . Alcohol abuse Father   . Stroke Mother   . Heart disease Brother   . Diabetes Brother   . Heart disease Paternal Grandmother   . Colon  cancer Neg Hx   . Stomach cancer Neg Hx    Social History   Tobacco Use  . Smoking status: Former Smoker    Packs/day: 0.50    Types: Cigarettes    Last attempt to quit: 10/25/1962    Years since quitting: 56.2  . Smokeless tobacco: Former Systems developer    Types: Chew    Quit date: 04/15/2018  Substance Use Topics  . Alcohol use: No    Alcohol/week: 0.0 standard drinks  . Drug use: No   Current Outpatient Medications  Medication Sig Dispense Refill  . ALPRAZolam (XANAX) 1 MG tablet Take 1/2 to 1 tablet 2 to 3 x/day ONLY if needed for Anxiety Attack and please try to limit to 5 days /week to avoid addiction 90 tablet 0  . aspirin 81 MG tablet Take 81 mg by mouth daily.      . Cholecalciferol (VITAMIN D3) 2000 UNITS capsule Take 8,000 Units by mouth daily.     . clopidogrel (PLAVIX) 75 MG tablet TAKE 1 TABLET BY MOUTH ONCE DAILY 90 tablet 1  . fludrocortisone (FLORINEF) 0.1 MG tablet TAKE 1 TABLET BY MOUTH TWICE DAILY 180 tablet 1  . glucose blood (FREESTYLE LITE) test strip TEST BLOOD SUGAR ONCE DAILY. Dx-E11.22. 100 each 3  . Lancets (FREESTYLE) lancets 1 each by Other route as needed for other. Use as instructed    . Melatonin 5 MG CAPS Take 10 mg by mouth at bedtime.     . mirtazapine (REMERON SOL-TAB) 30 MG disintegrating tablet Take 1 tablet 1 hour before bedtime for Appetite & Sleep 90 tablet 3  . neomycin-polymyxin b-dexamethasone (MAXITROL) 3.5-10000-0.1 SUSP 1 - 2 drops to affected Eye(s) 4 x /day as directed 10 mL 3  . nitroGLYCERIN (NITROSTAT) 0.4 MG SL tablet Sig: 1 tablet under tongue every 3 to 5 minutes as needed for Angina - Please Dispense # 2 bottles of # 25 tabs 50 tablet 6  . pantoprazole (PROTONIX) 40 MG tablet TAKE ONE TABLET BY MOUTH ONCE DAILY 90 tablet 3  . potassium citrate (UROCIT-K) 10 MEQ (1080 MG) SR tablet TAKE ONE TABLET BY MOUTH TWICE DAILY 180 tablet 1  . predniSONE (DELTASONE) 5 MG tablet Take 1 tablet 3 x /day or as directed 90 tablet 1  . prochlorperazine  (COMPAZINE) 5 MG tablet TAKE 1-2 TABS THREE TIMES A DAY AS NEEDED FOR NAUSEA 50 tablet 0  . QUEtiapine (SEROQUEL) 25 MG tablet TAKE 1 TABLET BY MOUTH THREE TIMES DAILY 270 tablet 0  . simvastatin (ZOCOR) 80 MG tablet TAKE ONE TABLET BY MOUTH AT BEDTIME 90 tablet 1   No current facility-administered medications for this visit.  Allergies  Allergen Reactions  . Gabapentin Other (See Comments)    Severe confusion  . Prozac [Fluoxetine Hcl] Other (See Comments)    Patient state that it does not agree with him  . Soma [Carisoprodol] Other (See Comments)    Patient stated that it does not agree with him  . Beta Adrenergic Blockers Other (See Comments)    REACTION: Bradycardia     Review of Systems: All systems reviewed and negative except where noted in HPI.   Lab Results  Component Value Date   WBC 4.4 12/18/2018   HGB 14.3 12/18/2018   HCT 42.2 12/18/2018   MCV 95.3 12/18/2018   PLT 247 12/18/2018    Lab Results  Component Value Date   CREATININE 1.49 (H) 12/18/2018   BUN 17 12/18/2018   NA 144 12/18/2018   K 4.5 12/18/2018   CL 108 12/18/2018   CO2 28 12/18/2018    Lab Results  Component Value Date   ALT 18 12/18/2018   AST 18 12/18/2018   ALKPHOS 47 05/03/2017   BILITOT 0.5 12/18/2018     Physical Exam: NA  ASSESSMENT AND PLAN:  84 y/o male here for new patient visit to assess the following:  History of colon polyps / antiplatelet - history of TIA on Plavix, and history of numerous adenomas in 2013. He has some occasional loose stools, managed well by loperamide. Otherwise no alarm symptoms or anemia. We discussed at his age if he wanted to have another colonoscopy. I discussed risks / benefits about this. He otherwise feels well, has no cardiopulmonary symptoms. He reports he wants to have one after prior discussion with his PCP. Given his prior polyp burden, he is very concerned about that. I discussed that he will need to hold his Plavix for 5 days prior to  a procedure and that can increase his risk for recurrent TIA, we will reach out to his physician to see if that is acceptable to hold it. He can continue aspirin. Given the current COVID-19 outbreak, no routine procedures are being done now. This will likely not be done for a few months, he will be contacted to schedule. He agreed.  GERD / history of GAVE - on protonix and doing well, controlling his symptoms. 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 His CBC is normal, would monitor.  [place] Cellar, MD [facility]  CC: Unk Pinto, MD

## 2019-02-01 ENCOUNTER — Other Ambulatory Visit: Payer: Self-pay | Admitting: Internal Medicine

## 2019-02-01 DIAGNOSIS — F419 Anxiety disorder, unspecified: Secondary | ICD-10-CM

## 2019-02-01 MED ORDER — ALPRAZOLAM 1 MG PO TABS: ORAL_TABLET | ORAL | 0 refills | Status: DC

## 2019-02-02 ENCOUNTER — Other Ambulatory Visit: Payer: Self-pay | Admitting: Internal Medicine

## 2019-02-02 DIAGNOSIS — R110 Nausea: Secondary | ICD-10-CM

## 2019-02-02 DIAGNOSIS — F419 Anxiety disorder, unspecified: Secondary | ICD-10-CM

## 2019-02-02 MED ORDER — PROCHLORPERAZINE MALEATE 5 MG PO TABS: ORAL_TABLET | ORAL | 0 refills | Status: DC

## 2019-02-02 MED ORDER — ESCITALOPRAM OXALATE 10 MG PO TABS: ORAL_TABLET | ORAL | 1 refills | Status: DC

## 2019-03-05 ENCOUNTER — Telehealth: Payer: Self-pay | Admitting: Gastroenterology

## 2019-03-05 ENCOUNTER — Telehealth: Payer: Self-pay | Admitting: *Deleted

## 2019-03-05 ENCOUNTER — Telehealth: Payer: Self-pay

## 2019-03-05 NOTE — Telephone Encounter (Signed)
Approval from Dr. Melford Aase to hold Plavix for 5 days prior to procedure received.  Pt to be notified at [facility] which is scheduled for 03-20-19

## 2019-03-05 NOTE — Telephone Encounter (Signed)
-----   Message from Unk Pinto, MD sent at 03/05/2019  4:02 PM EDT ----- Re: John [name] (1935-08-30) may withhold his Plavix for 5 - 7 days preceding scheduled Colonoscopy - Ernst Bowler, MD

## 2019-03-05 NOTE — Telephone Encounter (Signed)
Jan,  This pt had a virtual visit 01-25-2019 with Dr Havery Moros- He is scheduled for a colon on 04-05-2019-- he is on Plavix-   In the 4-2 note, it states pt will need Plavix hold-  Can you please send a Plavix hold for this pt to his prescribing physician.  His PV is tomorrow 03-06-2019.   Thanks, Lelan Pons PV

## 2019-03-05 NOTE — Telephone Encounter (Signed)
   John [name] [birth year] [id number]  Dear Dr. Melford Aase:  We have scheduled the above named patient for a(n) Colonoscopy procedure. Our records show that (s)he is on anticoagulation therapy.  Please advise as to whether the patient may come off their therapy of Plavix for 5 days prior to their procedure which is scheduled for 04-05-2019.  Please route your response to Lemar Lofty, CMA or fax response to [phone].  Sincerely,    [facility]

## 2019-03-05 NOTE — Telephone Encounter (Signed)
Approval rec'd from Dr. Melford Aase.  See phone note from today

## 2019-03-05 NOTE — Telephone Encounter (Signed)
   Primary Cardiologist: Lauree Chandler, MD  Chart reviewed as part of pre-operative protocol coverage.  Patient takes plavix for stroke prevention - this is managed by his PCP.  I will route this recommendation to the requesting party via Epic fax function and remove from pre-op pool.  Please call with questions.  [place], PA 03/05/2019, 2:43 PM

## 2019-03-05 NOTE — Telephone Encounter (Signed)
Anti coag clearance

## 2019-03-05 NOTE — Telephone Encounter (Signed)
[facility] Pre-operative Risk Assessment     Request for surgical clearance:     Endoscopy Procedure  What type of surgery is being performed?     Colonoscopy  When is this surgery scheduled?     04-05-19  What type of clearance is required ?   Pharmacy  Are there any medications that need to be held prior to surgery and how long? Plavix 5 days  Practice name and name of physician performing surgery?   Dr. [place] Cellar,  [facility]  What is your office phone and fax number?    Phone- [phone]  Fax- [phone] attn: Lemar Lofty, CMA  Anesthesia type (None, local, MAC, general) ?       MAC  Thank you!

## 2019-03-05 NOTE — Telephone Encounter (Signed)
Request to hold Plavix sent to [facility].  Previsit moved appt to the end of May to allow time to hear back from Cardiology and also to be closer to procedure date for pt to retain instructions.

## 2019-03-05 NOTE — Telephone Encounter (Signed)
A user error has taken place: ERROR °

## 2019-03-14 ENCOUNTER — Telehealth: Payer: Self-pay | Admitting: *Deleted

## 2019-03-14 NOTE — Telephone Encounter (Signed)
John,  This pt has a complicated medical hx.  Could you review his chart?  His PV is 03-20-19 and his colonoscopy is 04-05-19.  Thank you, Cyril Mourning

## 2019-03-14 NOTE — Telephone Encounter (Signed)
Kriisten,  This pt is cleared for anesthetic care at [facility].  Thanks,  Osvaldo Angst

## 2019-03-20 ENCOUNTER — Other Ambulatory Visit: Payer: Self-pay

## 2019-03-20 ENCOUNTER — Ambulatory Visit (AMBULATORY_SURGERY_CENTER): Payer: Self-pay

## 2019-03-20 VITALS — Ht 71.0 in | Wt 150.0 lb

## 2019-03-20 DIAGNOSIS — Z86010 Personal history of colonic polyps: Secondary | ICD-10-CM

## 2019-03-20 MED ORDER — NA SULFATE-K SULFATE-MG SULF 17.5-3.13-1.6 GM/177ML PO SOLN: 1.0000 | Freq: Once | ORAL | 0 refills | Status: AC

## 2019-03-20 NOTE — Progress Notes (Signed)
Denies allergies to eggs or soy products. Denies complication of anesthesia or sedation. Denies use of weight loss medication. Denies use of O2.   Emmi instructions declined.   Pre-Visit conducted by phone due to Covid 19. Instructions were reviewed and mailed to patients confirmed home address. Patient was encouraged to call if he had any questions or concerns. Patient thought that [employer] would pay for his prep. I was going to offer him a sample but he said [employer] has two different tiers and he had the one that pays. I told him to call if it didn't pay or was too expensive.

## 2019-03-21 NOTE — Progress Notes (Signed)
MEDICARE ANNUAL WELLNESS VISIT AND FOLLOW UP Assessment:   Encounter for Medicare annual wellness exam 1 year  Essential hypertension Monitor blood pressure at home; call if consistently over 130/80 Continue DASH diet.   Reminder to go to the ER if any CP, SOB, nausea, dizziness, severe HA, changes vision/speech, left arm numbness and tingling and jaw pain.  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  Followed by cardiology  Autonomic postural hypotension Symptoms stable on florinef   Gastric AV malformation Followed by GI  Insomnia, unspecified type Insomnia- good sleep hygiene discussed, increase day time activity Cautioned risks with xanax, seroquel Continue follow up with neurology  Chronic obstructive pulmonary disease, unspecified COPD type ([place]) + hx of smoking Symptoms stable off of inhalers  GERD Well managed on current medications; continue PPI for hx of duodenal ulcer/bleeding Discussed diet, avoiding triggers and other lifestyle changes  Diabetic peripheral neuropathy ([place]) Denies recent symptoms  CKD stage G3a/A1, GFR 45-59 and albumin creatinine ratio <30 mg/g (HCC) Increase fluids, avoid NSAIDS, monitor sugars, will monitor Check CMP/GFR  Vitamin D deficiency At goal at recent check; continue to recommend supplementation for goal of 70-100 Defer vitamin D level  Mixed hyperlipidemia Continue medications Continue low cholesterol diet and exercise.  Check lipid panel.   Medication management CBC, CMP/GFR  History of TIA (transient ischemic attack) BP/LDL at goal Continue plavix except will stop for 3-4 days for colonoscopy Followed by neurology  History of colonic polyps Reminded due for follow up with Dr. Deatra Ina November of this year  BMI 21, adult Continue to recommend diet heavy in fruits and veggies and low in animal  meats, cheeses, and dairy products, appropriate calorie intake Discuss exercise recommendations routinely Continue to monitor weight at each visit   Over 30 minutes of exam, counseling, chart review, and critical decision making was performed  Future Appointments  Date Time Provider [place]  04/05/2019  8:30 AM Armbruster, Carlota Raspberry, MD [facility]  06/26/2019  2:30 PM Unk Pinto, MD [facility] None  09/10/2019  1:30 PM Ward Givens, NP [facility] None  09/13/2019  2:00 PM [facility] 3 [id number]  01/15/2020 10:00 AM Unk Pinto, MD [facility] None     Plan:   During the course of the visit the patient was educated and counseled about appropriate screening and preventive services including:    Pneumococcal vaccine   Influenza vaccine  Prevnar 13  Td vaccine  Screening electrocardiogram  Colorectal cancer screening  Diabetes screening  Glaucoma screening  Nutrition counseling    Subjective:  John [name] is a 84 y.o. male who presents for Medicare Annual Wellness Visit and 3 month follow up for HTN, hyperlipidemia, glucose management, and vitamin D Def.   Patient has hx/o mild Dementia & is followed by Dr Rexene Alberts for suspect possible early or atypical Parkinson's Dz. Patient also has hx/o recurrent kidney stones (>200+) followed by Dr Dutch Gray as needed. HTN predates 1960's, recenty labile with postural hypotension and now takes 1 to 2 Florinef daily to avoid postural Hypotension.  He has some depression due to his wife passing last year however he has a very supportive family and states he misses the love of his life but is trying to enjoy time with his family like his wife would have wanted him to.   Patient underwent CABG x 2 V in 1996 And has done well since. Continues to follow with  cardiology.   he has ongoing insomnia and is currently on melatonin, seroquel, xanax, reports symptoms are well controlled on current  regimen. he currently takes 1 mg daily at night to sleep; has not done well with attempts to taper.   BMI is Body mass index is 21.76 kg/m., he has not been working on diet and exercise. Wt Readings from Last 3 Encounters:  03/22/19 156 lb (70.8 kg)  03/20/19 150 lb (68 kg)  12/18/18 149 lb 3.2 oz (67.7 kg)   His blood pressure has been controlled at home, today their BP is BP: 124/77 He does not workout. He denies chest pain, shortness of breath, dizziness.   He is on cholesterol medication and denies myalgias. His cholesterol is at goal. The cholesterol last visit was:   Lab Results  Component Value Date   CHOL 176 12/18/2018   HDL 54 12/18/2018   LDLCALC 99 12/18/2018   TRIG 126 12/18/2018   CHOLHDL 3.3 12/18/2018   He has been working on diet and exercise for glucose management (hx of lifestyle controlled diabetes), and denies foot ulcerations, hyperglycemia, hypoglycemia , increased appetite, nausea, paresthesia of the feet, polydipsia, polyuria, visual disturbances, vomiting and weight loss. Last A1C in the office was:  Lab Results  Component Value Date   HGBA1C 5.3 12/18/2018   Last GFR Lab Results  Component Value Date   GFRNONAA 55 (L) 12/18/2018    Patient is on Vitamin D supplement and at goal at recent check:    Lab Results  Component Value Date   VD25OH 60 12/18/2018      Medication Review:  Current Outpatient Medications (Endocrine & Metabolic):  .  fludrocortisone (FLORINEF) 0.1 MG tablet, TAKE 1 TABLET BY MOUTH TWICE DAILY  Current Outpatient Medications (Cardiovascular):  .  nitroGLYCERIN (NITROSTAT) 0.4 MG SL tablet, Sig: 1 tablet under tongue every 3 to 5 minutes as needed for Angina - Please Dispense # 2 bottles of # 25 tabs .  simvastatin (ZOCOR) 80 MG tablet, TAKE ONE TABLET BY MOUTH AT BEDTIME   Current Outpatient Medications (Analgesics):  .  aspirin 81 MG tablet, Take 81 mg by mouth daily.    Current Outpatient Medications (Hematological):   .  clopidogrel (PLAVIX) 75 MG tablet, TAKE 1 TABLET BY MOUTH ONCE DAILY  Current Outpatient Medications (Other):  [name]  ALPRAZolam (XANAX) 1 MG tablet, Take 1/2 to 1 tablet 2 to 3 x/day ONLY if needed for Anxiety Attack and please try to limit to 5 days /week to avoid addiction .  Cholecalciferol (VITAMIN D3) 2000 UNITS capsule, Take 8,000 Units by mouth daily.  [name]  escitalopram (LEXAPRO) 10 MG tablet, Take 1 tablet daily for anxiety .  glucose blood (FREESTYLE LITE) test strip, TEST BLOOD SUGAR ONCE DAILY. [id number]. [name]  Lancets (FREESTYLE) lancets, 1 each by Other route as needed for other. Use as instructed .  Melatonin 5 MG CAPS, Take 10 mg by mouth at bedtime.  .  pantoprazole (PROTONIX) 40 MG tablet, TAKE ONE TABLET BY MOUTH ONCE DAILY .  potassium citrate (UROCIT-K) 10 MEQ (1080 MG) SR tablet, TAKE ONE TABLET BY MOUTH TWICE DAILY .  prochlorperazine (COMPAZINE) 5 MG tablet, Take 1 tablet 3 x /day if needed for nausea .  QUEtiapine (SEROQUEL) 25 MG tablet, TAKE 1 TABLET BY MOUTH THREE TIMES DAILY  Allergies: Allergies  Allergen Reactions  . Gabapentin Other (See Comments)    Severe confusion  . Prozac [Fluoxetine Hcl] Other (See Comments)    Patient state  that it does not agree with him  . Soma [Carisoprodol] Other (See Comments)    Patient stated that it does not agree with him  . Beta Adrenergic Blockers Other (See Comments)    REACTION: Bradycardia    Current Problems (verified) has Mixed hyperlipidemia; Essential hypertension; ASCAD/CABG; History of TIA (transient ischemic attack); Asthma; COPD (chronic obstructive pulmonary disease) ([place]); GERD; History of colonic polyps; Gastric AV malformation; Medication management; Vitamin D deficiency; CKD stage G3a/A1, GFR 45-59 and albumin creatinine ratio <30 mg/g ([place]); Autonomic postural hypotension; Esophageal reflux; BMI 21.0-21.9, adult; Diabetic peripheral neuropathy ([place]); and Insomnia on their problem list.  Screening  Tests Immunization History  Administered Date(s) Administered  . Influenza, High Dose Seasonal PF 08/13/2013, 08/05/2014, 07/23/2015, 06/22/2016, 07/18/2017, 07/17/2018  . Pneumococcal Conjugate-13 08/27/2014  . Pneumococcal Polysaccharide-23 08/06/2012  . Tdap 08/01/2013  . Zoster 05/02/2006   Preventative care: Last colonoscopy: 08/2013 Dr. Deatra Ina scheduled for June 7th  Prior vaccinations: TD or Tdap: 2014  Influenza: 2019 Pneumococcal: 2013 Prevnar13: 2015 Shingles/Zostavax: 2007  Names of Other Physician/Practitioners you currently use: 1.  [facility] here for primary care 2. Dr. Delton Coombes, eye doctor, last visit 11/2017 3. Dr. Gwendolyn Fill, dentist, last visit 2019  Patient Care Team: Unk Pinto, MD as PCP - General Angelena Form Annita Brod, MD as PCP - Cardiology (Cardiology) Burnell Blanks, MD as Consulting Physician (Cardiology) Inda Castle, MD (Inactive) as Consulting Physician (Gastroenterology) Jannette Spanner, MD as Referring Physician (Dermatology)  Surgical: He  has a past surgical history that includes Cardiac catheterization; Gastrectomy; Vagotomy; Cystourethroscopy with stent removal.; Irrigation and debridement sabaceous cyst; Colonoscopy; Upper gastrointestinal endoscopy; Excisional hemorrhoidectomy; Polypectomy; Coronary artery bypass graft (1996); Prostate surgery; Cystoscopy with ureteroscopy, stone basketry and stent placement; Ankle fracture surgery (Left, ~ 2012); Hiatal hernia repair; and LEFT HEART CATH AND CORS/GRAFTS ANGIOGRAPHY (N/A, 12/23/2016). Family His family history includes Alcohol abuse in his father; Diabetes in his brother; Heart disease in his brother and paternal grandmother; Stroke in his mother. Social history  He reports that he quit smoking about 56 years ago. His smoking use included cigarettes. He smoked 0.50 packs per day. He quit smokeless tobacco use about 11 months ago.  His  smokeless tobacco use included chew. He reports that he does not drink alcohol or use drugs.  MEDICARE WELLNESS OBJECTIVES: Physical activity: Current Exercise Habits: The patient does not participate in regular exercise at present(works out in the yard) Cardiac risk factors: Cardiac Risk Factors include: advanced age (>51men, >58 women);hypertension;male gender;smoking/ tobacco exposure Depression/mood screen:   Depression screen [facility] 2/9 03/22/2019  Decreased Interest 0  Down, Depressed, Hopeless 0  PHQ - 2 Score 0  Some recent data might be hidden    ADLs:  In your present state of health, do you have any difficulty performing the following activities: 03/22/2019 12/17/2018  Hearing? N N  Vision? N N  Difficulty concentrating or making decisions? Y N  Comment occ will have issues with memory -  Walking or climbing stairs? N N  Dressing or bathing? N N  Doing errands, shopping? N N  Some recent data might be hidden     Cognitive Testing  Alert? Yes  Normal Appearance?Yes  Oriented to person? Yes  Place? Yes   Time? Yes  Displays appropriate judgment?Yes  EOL planning: Does Patient Have a Medical Advance Directive?: Yes Type of Advance Directive: Healthcare Power of Attorney, Living will Does patient want to make changes to medical advance directive?: No -  Patient declined   Objective:   Today's Vitals   03/22/19 1424  BP: 124/77  Weight: 156 lb (70.8 kg)   Body mass index is 21.76 kg/m.  General Appearance:Well sounding, in no apparent distress.  ENT/Mouth: No hoarseness, No cough for duration of visit.  Respiratory: completing full sentences without distress, without audible wheeze Neuro: Awake and oriented X 3,  Psych:  Insight and Judgment appropriate.    Medicare Attestation I have personally reviewed: The patient's medical and social history Their use of alcohol, tobacco or illicit drugs Their current medications and supplements The patient's functional  ability including ADLs,fall risks, home safety risks, cognitive, and hearing and visual impairment Diet and physical activities Evidence for depression or mood disorders  The patient's weight, height, BMI, and visual acuity have been recorded in the chart.  I have made referrals, counseling, and provided education to the patient based on review of the above and I have provided the patient with a written personalized care plan for preventive services.     Vicie Mutters, PA-C   03/22/2019

## 2019-03-22 ENCOUNTER — Encounter: Payer: Self-pay | Admitting: Physician Assistant

## 2019-03-22 ENCOUNTER — Ambulatory Visit: Payer: Self-pay | Admitting: Physician Assistant

## 2019-03-22 ENCOUNTER — Ambulatory Visit: Payer: PPO | Admitting: Physician Assistant

## 2019-03-22 ENCOUNTER — Other Ambulatory Visit: Payer: Self-pay

## 2019-03-22 ENCOUNTER — Encounter: Payer: Self-pay | Admitting: Gastroenterology

## 2019-03-22 VITALS — BP 124/77 | Wt 156.0 lb

## 2019-03-22 DIAGNOSIS — E782 Mixed hyperlipidemia: Secondary | ICD-10-CM

## 2019-03-22 DIAGNOSIS — R6889 Other general symptoms and signs: Secondary | ICD-10-CM | POA: Diagnosis not present

## 2019-03-22 DIAGNOSIS — G4700 Insomnia, unspecified: Secondary | ICD-10-CM

## 2019-03-22 DIAGNOSIS — J45909 Unspecified asthma, uncomplicated: Secondary | ICD-10-CM | POA: Diagnosis not present

## 2019-03-22 DIAGNOSIS — Z0000 Encounter for general adult medical examination without abnormal findings: Secondary | ICD-10-CM

## 2019-03-22 DIAGNOSIS — K31819 Angiodysplasia of stomach and duodenum without bleeding: Secondary | ICD-10-CM

## 2019-03-22 DIAGNOSIS — J449 Chronic obstructive pulmonary disease, unspecified: Secondary | ICD-10-CM

## 2019-03-22 DIAGNOSIS — Z8673 Personal history of transient ischemic attack (TIA), and cerebral infarction without residual deficits: Secondary | ICD-10-CM

## 2019-03-22 DIAGNOSIS — I25812 Atherosclerosis of bypass graft of coronary artery of transplanted heart without angina pectoris: Secondary | ICD-10-CM

## 2019-03-22 DIAGNOSIS — Z86010 Personal history of colonic polyps: Secondary | ICD-10-CM | POA: Diagnosis not present

## 2019-03-22 DIAGNOSIS — N1831 Chronic kidney disease, stage 3a: Secondary | ICD-10-CM

## 2019-03-22 DIAGNOSIS — E1142 Type 2 diabetes mellitus with diabetic polyneuropathy: Secondary | ICD-10-CM

## 2019-03-22 DIAGNOSIS — Z79899 Other long term (current) drug therapy: Secondary | ICD-10-CM

## 2019-03-22 DIAGNOSIS — I951 Orthostatic hypotension: Secondary | ICD-10-CM

## 2019-03-22 DIAGNOSIS — I10 Essential (primary) hypertension: Secondary | ICD-10-CM | POA: Diagnosis not present

## 2019-03-22 DIAGNOSIS — Z0001 Encounter for general adult medical examination with abnormal findings: Secondary | ICD-10-CM | POA: Diagnosis not present

## 2019-03-22 DIAGNOSIS — N183 Chronic kidney disease, stage 3 (moderate): Secondary | ICD-10-CM | POA: Diagnosis not present

## 2019-03-22 DIAGNOSIS — E559 Vitamin D deficiency, unspecified: Secondary | ICD-10-CM

## 2019-03-22 DIAGNOSIS — Z6821 Body mass index (BMI) 21.0-21.9, adult: Secondary | ICD-10-CM

## 2019-04-02 ENCOUNTER — Other Ambulatory Visit: Payer: Self-pay | Admitting: Internal Medicine

## 2019-04-02 DIAGNOSIS — E162 Hypoglycemia, unspecified: Secondary | ICD-10-CM

## 2019-04-03 ENCOUNTER — Telehealth: Payer: Self-pay | Admitting: *Deleted

## 2019-04-03 ENCOUNTER — Ambulatory Visit (INDEPENDENT_AMBULATORY_CARE_PROVIDER_SITE_OTHER): Payer: PPO | Admitting: Internal Medicine

## 2019-04-03 ENCOUNTER — Other Ambulatory Visit: Payer: Self-pay

## 2019-04-03 ENCOUNTER — Other Ambulatory Visit: Payer: PPO

## 2019-04-03 VITALS — BP 120/60 | HR 76 | Temp 97.1°F | Resp 16 | Ht 71.0 in | Wt 159.4 lb

## 2019-04-03 DIAGNOSIS — E162 Hypoglycemia, unspecified: Secondary | ICD-10-CM | POA: Diagnosis not present

## 2019-04-03 DIAGNOSIS — E161 Other hypoglycemia: Secondary | ICD-10-CM | POA: Diagnosis not present

## 2019-04-03 NOTE — Progress Notes (Signed)
Subjective:    Patient ID: John [name], male    DOB: [birth year], 84 y.o.   MRN: [id number]  HPI    Patient is a very nice 84 y.o. John [name] with  HTN / postural Hypotension, ASCAD s/p CABG, LDand Vitamin D Deficiency who has hx/o T2_DM (1995) resolving with moderate weight loss on Metformin which was stopped in 2008. Since then [name] have remained in the normal range.     More recently , he has developed several episodes of unexpected hypoglycemia with glucoses in the 40's despite adequate food intake and being on No hypoglycemic meds. I was felt prudent to bring him in for a modified glucose insulin tolerance test to rule out hyperinsulinism. He presents near fasting this am with morning FBG of 78 mg% and he did eat a small breakfast prior to driving to the office this am.   Medication Sig  . ALPRAZolam  1 MG tablet Take 1/2 to 1 tablet 2 to 3 x/day ONLY if needed  . aspirin 81 MG tablet Take  daily.    [name] VITAMIN D) 2000 UNITS cap Take 8,000 Units by mouth daily.   [name] Clopidogrel  75 MG tablet TAKE 1 TABLET BY MOUTH ONCE DAILY  . escitalopram  10 MG tablet Take 1 tablet daily for anxiety  . FLORINEF 0.1 MG tablet TAKE 1 TABLET BY MOUTH TWICE DAILY  . Melatonin 5 MG CAPS Take 10 mg by mouth at bedtime.   [name] NITROSTAT 0.4 MG SL tablet as needed for Angina   . pantoprazole  40 MG tablet TAKE ONE TABLET  ONCE DAILY  . UROCIT-K 10 MEQ  SR tablet TAKE ONE TABLETTWICE DAILY  . prochlorperazine  5 MG tablet Take 1 tablet 3 x /day if needed for nausea  . QUEtiapine  25 MG tablet TAKE 1 TABLET  THREE TIMES DAILY  . simvastatin  80 MG tablet TAKE ONE TABLET  AT BEDTIME   Allergies  Allergen Reactions  . Gabapentin Other (See Comments)    Severe confusion  . Prozac [Fluoxetine Hcl] Other (See Comments)    Patient state that it does not agree with him  . Soma [Carisoprodol] Other (See Comments)    Patient stated that it does not agree with him  . Beta Adrenergic Blockers Other (See Comments)     REACTION: Bradycardia   Past Medical History:  Diagnosis Date  . Anxiety   . Arthritis    "lower back; left ankle" (03/20/2014)  . ASTHMA   . Blood transfusion    "related to OHS, ulcers"  . CAD (coronary artery disease)    a. s/p 2V CABG 1996. b. Last cath 12/2016 -> patent grafts, med rx.  . CAD, ARTERY BYPASS GRAFT   . Cataract   . CKD (chronic kidney disease), stage II   . Colon polyps    adenomatous polyps  . COPD   . Depression   . Diabetes mellitus without complication ([place])   . Diverticulosis   . Duodenal ulcer without hemorrhage or perforation 12/29/2011  . GASTROESOPHAGEAL REFLUX DISEASE   . HYPERLIPIDEMIA   . Insomnia   . 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 Memory loss   . Nephrolithiasis    "I've had over 320 kidney stones" (03/20/2014)  . NEPHROLITHIASIS, HX OF 03/12/2009  . Parkinsonism ([place])   . Sleep apnea    "don't always wear it" (03/20/2014)  . Stroke [facility])    TIA  . SUPRAVENTRICULAR TACHYCARDIA, HX OF   .  SVT (supraventricular tachycardia) ([place]) 03/12/2009  . TIA   . Ulcer    Past Surgical History:  Procedure Laterality Date  . ANKLE FRACTURE SURGERY Left ~ 2012  . CARDIAC CATHETERIZATION    . COLONOSCOPY    . CORONARY ARTERY BYPASS GRAFT  1996   CABG X2  . CYSTOSCOPY WITH URETEROSCOPY, STONE BASKETRY AND STENT PLACEMENT    . Cystourethroscopy with stent removal.    . EXCISIONAL HEMORRHOIDECTOMY    . GASTRECTOMY    . HIATAL HERNIA REPAIR    . IRRIGATION AND DEBRIDEMENT SEBACEOUS CYST     "off my back  . LEFT HEART CATH AND CORS/GRAFTS ANGIOGRAPHY N/A 12/23/2016   Procedure: Left Heart Cath and Cors/Grafts Angiography;  Surgeon: Burnell Blanks, MD;  Location: [facility];  Service: Cardiovascular;  Laterality: N/A;  . POLYPECTOMY    . PROSTATE SURGERY     "took the center of my prostate out"  . UPPER GASTROINTESTINAL ENDOSCOPY    . VAGOTOMY     Review of Systems . 10 point systems review negative except as above.     Objective:    Physical Exam  BP 120/60   Pulse 76   Temp (!) 97.1 F (36.2 C)   Resp 16   Ht 5\' 11"  (1.803 m)   Wt 159 lb 6.4 oz (72.3 kg)   BMI 22.23 kg/m     Fasting   1/2 hour    1 hour   2 hour  Glucose             138 mg%  167 mg%   204 mg%  83 mg%   Insulin      14.6    191.1     36.1   28.8    Assessment & Plan:   1. Hypoglycemia  2. Hyperinsulinism, ruled out  - Patient advised that screening Insulin stress test is not indicative of hyperinsulinism or insulinoma.   - Given his hx/o episodic hypoglycemic episodes, he is advised to eat 6 feedings /day with between meal snacks of low glycemic / high protein content as peanut butter or cheese crackers , pudding/ yogurt, etc and to avoid high glycemic sugary snacks which he is accustomed to.   - Patient is scheduled for f/u colonoscopy on Thursday & he's advised to remind them of his tendency to hypoglycemia.

## 2019-04-03 NOTE — Telephone Encounter (Signed)
LMOM, will call back

## 2019-04-04 ENCOUNTER — Telehealth: Payer: Self-pay | Admitting: *Deleted

## 2019-04-04 LAB — INSULIN, RANDOM
Insulin: 28.8 u[IU]/mL — ABNORMAL HIGH
Insulin: 36.1 u[IU]/mL — ABNORMAL HIGH
Insulin: 191.1 u[IU]/mL — ABNORMAL HIGH
Insulin: 14.6 u[IU]/mL

## 2019-04-04 NOTE — Patient Instructions (Signed)
Preventing Hypoglycemia Hypoglycemia occurs when the level of sugar (glucose) in the blood is too low. Hypoglycemia can happen in people who do or do not have diabetes (diabetes mellitus). It can develop quickly, and it can be a medical emergency. For most people with diabetes, a blood glucose level below 70 mg/dL (3.9 mmol/L) is considered hypoglycemia. Glucose is a type of sugar that provides the body's main source of energy. Certain hormones (insulin and glucagon) control the level of glucose in the blood. Insulin lowers blood glucose, and glucagon increases blood glucose. Hypoglycemia can result from having too much insulin in the bloodstream, or from not eating enough food that contains glucose. Your risk for hypoglycemia is higher:  If you take insulin or diabetes medicines to help lower your blood glucose or help your body make more insulin.  If you skip or delay a meal or snack.  If you are ill.  During and after exercise. You can prevent hypoglycemia by working with your health care provider to adjust your meal plan as needed and by taking other precautions. How can hypoglycemia affect me? Mild symptoms Mild hypoglycemia may not cause any symptoms. If you do have symptoms, they may include:  Hunger.  Anxiety.  Sweating and feeling clammy.  Dizziness or feeling light-headed.  Sleepiness.  Nausea.  Increased heart rate.  Headache.  Blurry vision.  Irritability.  Tingling or numbness around the mouth, lips, or tongue.  A change in coordination.  Restless sleep. If mild hypoglycemia is not recognized and treated, it can quickly become moderate or severe hypoglycemia. Moderate symptoms Moderate hypoglycemia can cause:  Mental confusion and poor judgment.  Behavior changes.  Weakness.  Irregular heartbeat. Severe symptoms Severe hypoglycemia is a medical emergency. It can cause:  Fainting.  Seizures.  Loss of consciousness (coma).  Death. What  nutrition changes can be made?  Work with your health care provider or diet and nutrition specialist (dietitian) to make a healthy meal plan that is right for you. Follow your meal plan carefully.  Eat meals at regular times.  If recommended by your health care provider, have snacks between meals.  Donot skip or delay meals or snacks. You can be at risk for hypoglycemia if you are not getting enough carbohydrates. What lifestyle changes can be made?   Work closely with your health care provider to manage your blood glucose. Make sure you know: ? Your goal blood glucose levels. ? How and when to check your blood glucose. ? The symptoms of hypoglycemia. It is important to treat it right away to keep it from becoming severe.  Do not drink alcohol on an empty stomach.  When you are ill, check your blood glucose more often than usual. Follow your sick day plan whenever you cannot eat or drink normally. Make this plan in advance with your health care provider.  Always check your blood glucose before, during, and after exercise. How is this treated? This condition can often be treated by immediately eating or drinking something that contains sugar, such as:  Fruit juice, 4-6 oz (120-150 mL).  Regular (not diet) soda, 4-6 oz (120-150 mL).  Low-fat milk, 4 oz (120 mL).  Several pieces of hard candy.  Sugar or honey, 1 Tbsp (15 mL). Treating hypoglycemia if you have diabetes If you are alert and able to swallow safely, follow the 15:15 rule:  Take 15 grams of a rapid-acting carbohydrate. Talk with your health care provider about how much you should take.  Rapid-acting options  include: ? Glucose pills (take 15 grams). ? 6-8 pieces of hard candy. ? 4-6 oz (120-150 mL) of fruit juice. ? 4-6 oz (120-150 mL) of regular (not diet) soda.  Check your blood glucose 15 minutes after you take the carbohydrate.  If the repeat blood glucose level is still at or below 70 mg/dL (3.9 mmol/L),  take 15 grams of a carbohydrate again.  If your blood glucose level does not increase above 70 mg/dL (3.9 mmol/L) after 3 tries, seek emergency medical care.  After your blood glucose level returns to normal, eat a meal or a snack within 1 hour. Treating severe hypoglycemia Severe hypoglycemia is when your blood glucose level is at or below 54 mg/dL (3 mmol/L). Severe hypoglycemia is a medical emergency. Get medical help right away. If you have severe hypoglycemia and you cannot eat or drink, you may need an injection of glucagon. A family member or close friend should learn how to check your blood glucose and how to give you a glucagon injection. Ask your health care provider if you need to have an emergency glucagon injection kit available. Severe hypoglycemia may need to be treated in a hospital. The treatment may include getting glucose through an IV. You may also need treatment for the cause of your hypoglycemia. Where to find more information  American Diabetes Association: www.diabetes.[employer] of Diabetes and Digestive and Kidney Diseases: [employer] Contact a health care provider if:  You have problems keeping your blood glucose in your target range.  You have frequent episodes of hypoglycemia. Get help right away if:  You continue to have hypoglycemia symptoms after eating or drinking something containing glucose.  Your blood glucose level is at or below 54 mg/dL (3 mmol/L).  You faint.  You have a seizure. These symptoms may represent a serious problem that is an emergency. Do not wait to see if the symptoms will go away. Get medical help right away. Call your local emergency services (911 in the [place] Summary  Know the symptoms of hypoglycemia, and when you are at risk for it (such as during exercise or when you are sick). Check your blood glucose often when you are at risk for hypoglycemia.  Hypoglycemia can develop quickly, and it can be dangerous  if it is not treated right away. If you have a history of severe hypoglycemia, make sure you know how to use your glucagon injection kit.  Make sure you know how to treat hypoglycemia. Keep a carbohydrate snack available when you may be at risk for hypoglycemia.

## 2019-04-04 NOTE — Telephone Encounter (Signed)

## 2019-04-05 ENCOUNTER — Ambulatory Visit (AMBULATORY_SURGERY_CENTER): Payer: PPO | Admitting: Gastroenterology

## 2019-04-05 ENCOUNTER — Other Ambulatory Visit: Payer: Self-pay

## 2019-04-05 ENCOUNTER — Encounter: Payer: Self-pay | Admitting: Gastroenterology

## 2019-04-05 VITALS — BP 152/85 | HR 69 | Temp 98.8°F | Resp 10

## 2019-04-05 DIAGNOSIS — D124 Benign neoplasm of descending colon: Secondary | ICD-10-CM

## 2019-04-05 DIAGNOSIS — D123 Benign neoplasm of transverse colon: Secondary | ICD-10-CM | POA: Diagnosis not present

## 2019-04-05 DIAGNOSIS — D120 Benign neoplasm of cecum: Secondary | ICD-10-CM | POA: Diagnosis not present

## 2019-04-05 DIAGNOSIS — R197 Diarrhea, unspecified: Secondary | ICD-10-CM | POA: Diagnosis not present

## 2019-04-05 DIAGNOSIS — Z86010 Personal history of colonic polyps: Secondary | ICD-10-CM

## 2019-04-05 DIAGNOSIS — D125 Benign neoplasm of sigmoid colon: Secondary | ICD-10-CM

## 2019-04-05 DIAGNOSIS — D122 Benign neoplasm of ascending colon: Secondary | ICD-10-CM

## 2019-04-05 DIAGNOSIS — Z1211 Encounter for screening for malignant neoplasm of colon: Secondary | ICD-10-CM | POA: Diagnosis not present

## 2019-04-05 MED ORDER — SODIUM CHLORIDE 0.9 % IV SOLN: 500.0000 mL | Freq: Once | INTRAVENOUS | Status: DC

## 2019-04-05 NOTE — Progress Notes (Signed)
A and O x3. Report to RN. Tolerated MAC anesthesia well.

## 2019-04-05 NOTE — Progress Notes (Signed)
Called pt back & left message  to let him know we have looked in linens bags and under stretchers and have not found his glasses.Tthe patient had called after he got home from his procedure saying he couldn't find his glasses.There was no documentation that he had glasses in admitting today.Left message for pt to call us to let us know if he found glasses or do we need to continue to search

## 2019-04-05 NOTE — Patient Instructions (Signed)
Resume Plavix in 3 days ( 04/08/19)  Information on polyps,diverticulosis,& hemorrhoids given to you today  Await pathology results on polyps removed and on biopsies done today   YOU HAD AN ENDOSCOPIC PROCEDURE TODAY AT [facility]:   Refer to the procedure report that was given to you for any specific questions about what was found during the examination.  If the procedure report does not answer your questions, please call your gastroenterologist to clarify.  If you requested that your care partner not be given the details of your procedure findings, then the procedure report has been included in a sealed envelope for you to review at your convenience later.  YOU SHOULD EXPECT: Some feelings of bloating in the abdomen. Passage of more gas than usual.  Walking can help get rid of the air that was put into your GI tract during the procedure and reduce the bloating. If you had a lower endoscopy (such as a colonoscopy or flexible sigmoidoscopy) you may notice spotting of blood in your stool or on the toilet paper. If you underwent a bowel prep for your procedure, you may not have a normal bowel movement for a few days.  Please Note:  You might notice some irritation and congestion in your nose or some drainage.  This is from the oxygen used during your procedure.  There is no need for concern and it should clear up in a day or so.  SYMPTOMS TO REPORT IMMEDIATELY:   Following lower endoscopy (colonoscopy or flexible sigmoidoscopy):  Excessive amounts of blood in the stool  Significant tenderness or worsening of abdominal pains  Swelling of the abdomen that is new, acute  Fever of 100F or higher    For urgent or emergent issues, a gastroenterologist can be reached at any hour by calling [phone].   DIET:  We do recommend a small meal at first, but then you may proceed to your regular diet.  Drink plenty of fluids but you should avoid alcoholic beverages for 24  hours.  ACTIVITY:  You should plan to take it easy for the rest of today and you should NOT DRIVE or use heavy machinery until tomorrow (because of the sedation medicines used during the test).    FOLLOW UP: Our staff will call the number listed on your records 48-72 hours following your procedure to check on you and address any questions or concerns that you may have regarding the information given to you following your procedure. If we do not reach you, we will leave a message.  We will attempt to reach you two times.  During this call, we will ask if you have developed any symptoms of COVID 19. If you develop any symptoms (ie: fever, flu-like symptoms, shortness of breath, cough etc.) before then, please call [phone].  If you test positive for Covid 19 in the 2 weeks post procedure, please call and report this information to [place].    If any biopsies were taken you will be contacted by phone or by letter within the next 1-3 weeks.  Please call us at [phone] if you have not heard about the biopsies in 3 weeks.    SIGNATURES/CONFIDENTIALITY: You and/or your care partner have signed paperwork which will be entered into your electronic medical record.  These signatures attest to the fact that that the information above on your After Visit Summary has been reviewed and is understood.  Full responsibility of the confidentiality of this discharge information lies  with you and/or your care-partner.

## 2019-04-05 NOTE — Op Note (Signed)
[place] [facility] Patient Name: John [name] Procedure Date: 04/05/2019 8:50 AM MRN: [id number] Endoscopist: Remo Lipps P. Havery Moros , MD Age: 84 Referring MD:  Date of Birth: [birth year] Gender: Male Account #: [id number] Procedure:                Colonoscopy Indications:              Surveillance: Personal history of adenomatous                            polyps on last colonoscopy > 5 years ago (18                            adenomas removed in 2013), patient incidentally                            with chronic loose stools Medicines:                Monitored Anesthesia Care Procedure:                Pre-Anesthesia Assessment:                           - Prior to the procedure, a History and Physical                            was performed, and patient medications and                            allergies were reviewed. The patient's tolerance of                            previous anesthesia was also reviewed. The risks                            and benefits of the procedure and the sedation                            options and risks were discussed with the patient.                            All questions were answered, and informed consent                            was obtained. Prior Anticoagulants: The patient has                            taken Plavix (clopidogrel), last dose was 5 days                            prior to procedure. ASA Grade Assessment: III - A                            patient with severe systemic disease. After  reviewing the risks and benefits, the patient was                            deemed in satisfactory condition to undergo the                            procedure.                           After obtaining informed consent, the colonoscope                            was passed under direct vision. Throughout the                            procedure, the patient's blood pressure, pulse, and                             oxygen saturations were monitored continuously. The                            Colonoscope was introduced through the anus and                            advanced to the the cecum, identified by                            appendiceal orifice and ileocecal valve. The                            colonoscopy was performed without difficulty. The                            patient tolerated the procedure well. The quality                            of the bowel preparation was adequate. The                            ileocecal valve, appendiceal orifice, and rectum                            were photographed. Scope In: 8:54:46 AM Scope Out: 2:42:35 AM Scope Withdrawal Time: 0 hours 41 minutes 10 seconds  Total Procedure Duration: 0 hours 51 minutes 26 seconds  Findings:                 The perianal and digital rectal examinations were                            normal.                           Nine sessile polyps were found in the cecum. The  polyps were 3 to 5 mm in size. These polyps were                            removed with a cold snare. Resection and retrieval                            were complete.                           Four sessile polyps were found in the ascending                            colon. The polyps were 3 to 5 mm in size. These                            polyps were removed with a cold snare. Resection                            and retrieval were complete.                           Two sessile polyps were found in the hepatic                            flexure. The polyps were 3 to 8 mm in size. These                            polyps were removed with a cold snare. Resection                            and retrieval were complete.                           Six sessile polyps were found in the transverse                            colon. The polyps were 3 to 10 mm in size. These                            polyps were removed with a cold  snare. Resection                            and retrieval were complete. The largest lesion had                            persistent oozing on withdrawal. Coagulation was                            applied through the snare tip with good hemostasis.                           A 4 mm polyp was found in the descending colon. The  polyp was sessile. The polyp was removed with a                            cold snare. Resection and retrieval were complete.                           Three sessile polyps were found in the sigmoid                            colon. The polyps were 3 to 5 mm in size. These                            polyps were removed with a cold snare. Resection                            and retrieval were complete.                           Multiple small-mouthed diverticula were found in                            the sigmoid colon.                           Internal hemorrhoids were found during retroflexion.                           The colon was spastic which prolonged the exam. The                            exam was otherwise without abnormality.                           Biopsies for histology were taken with a cold                            forceps from the right colon and left colon for                            evaluation of microscopic colitis. Complications:            No immediate complications. Estimated blood loss:                            Minimal. Estimated Blood Loss:     Estimated blood loss was minimal. Impression:               - Nine 3 to 5 mm polyps in the cecum, removed with                            a cold snare. Resected and retrieved.                           - Four 3 to 5 mm polyps in the ascending colon,  removed with a cold snare. Resected and retrieved.                           - Two 3 to 8 mm polyps at the hepatic flexure,                            removed with a cold snare. Resected and  retrieved.                           - Six 3 to 10 mm polyps in the transverse colon,                            removed with a cold snare. Resected and retrieved.                            One required snare tip coagulation for hemostasis.                           - One 4 mm polyp in the descending colon, removed                            with a cold snare. Resected and retrieved.                           - Three 3 to 5 mm polyps in the sigmoid colon,                            removed with a cold snare. Resected and retrieved.                           - Diverticulosis in the sigmoid colon.                           - Internal hemorrhoids.                           - The examination was otherwise normal.                           - Spastic colon                           - Biopsies were taken with a cold forceps from the                            right colon and left colon for evaluation of                            microscopic colitis. Recommendation:           - Patient has a contact number available for                            emergencies. The signs  and symptoms of potential                            delayed complications were discussed with the                            patient. Return to normal activities tomorrow.                            Written discharge instructions were provided to the                            patient.                           - Resume previous diet.                           - Continue present medications.                           - Resume Plavix in 3 days given numerous                            polypectomy sites and persistent bleeding noted at                            one polypectomy site that required endoscopic                            hemostasis                           - Await pathology results. Remo Lipps P. Armbruster, MD 04/05/2019 9:57:59 AM This report has been signed electronically.

## 2019-04-05 NOTE — Progress Notes (Signed)
Pt's states no medical or surgical changes since previsit or office visit. 

## 2019-04-09 ENCOUNTER — Telehealth: Payer: Self-pay

## 2019-04-09 NOTE — Telephone Encounter (Signed)
  Follow up Call-  Call back number 04/05/2019  Post procedure Call Back phone  # [phone] or [phone]  Permission to leave phone message Yes  Some recent data might be hidden     Patient questions:  Do you have a fever, pain , or abdominal swelling? No. Pain Score  0 *  Have you tolerated food without any problems? Yes.    Have you been able to return to your normal activities? Yes.    Do you have any questions about your discharge instructions: Diet   No. Medications  No. Follow up visit  No.  Do you have questions or concerns about your Care? No.  Actions: * If pain score is 4 or above: No action needed, pain <4.  1. Have you developed a fever since your procedure? no  2.   Have you had an respiratory symptoms (SOB or cough) since your procedure? *no  3.   Have you tested positive for COVID 19 since your procedure no  4.   Have you had any family members/close contacts diagnosed with the COVID 19 since your procedure? no   If yes to any of these questions please route to Joylene John, RN and Alphonsa Gin, Therapist, sports.

## 2019-04-11 ENCOUNTER — Other Ambulatory Visit: Payer: Self-pay | Admitting: Internal Medicine

## 2019-04-11 DIAGNOSIS — F419 Anxiety disorder, unspecified: Secondary | ICD-10-CM

## 2019-04-11 MED ORDER — ALPRAZOLAM 1 MG PO TABS: ORAL_TABLET | ORAL | 0 refills | Status: DC

## 2019-05-03 ENCOUNTER — Other Ambulatory Visit: Payer: Self-pay | Admitting: Internal Medicine

## 2019-05-03 MED ORDER — QUETIAPINE FUMARATE 25 MG PO TABS: ORAL_TABLET | ORAL | 3 refills | Status: DC

## 2019-05-11 ENCOUNTER — Other Ambulatory Visit: Payer: Self-pay | Admitting: Internal Medicine

## 2019-05-11 DIAGNOSIS — N138 Other obstructive and reflux uropathy: Secondary | ICD-10-CM

## 2019-05-11 MED ORDER — SILODOSIN 8 MG PO CAPS: ORAL_CAPSULE | ORAL | 3 refills | Status: DC

## 2019-05-11 MED ORDER — FINASTERIDE 5 MG PO TABS: ORAL_TABLET | ORAL | 3 refills | Status: DC

## 2019-06-01 ENCOUNTER — Other Ambulatory Visit: Payer: Self-pay | Admitting: Internal Medicine

## 2019-06-26 ENCOUNTER — Other Ambulatory Visit: Payer: Self-pay

## 2019-06-26 ENCOUNTER — Ambulatory Visit (INDEPENDENT_AMBULATORY_CARE_PROVIDER_SITE_OTHER): Payer: PPO | Admitting: Internal Medicine

## 2019-06-26 VITALS — BP 120/66 | HR 80 | Temp 97.4°F | Resp 16 | Ht 71.0 in | Wt 166.0 lb

## 2019-06-26 DIAGNOSIS — Z79899 Other long term (current) drug therapy: Secondary | ICD-10-CM

## 2019-06-26 DIAGNOSIS — E782 Mixed hyperlipidemia: Secondary | ICD-10-CM

## 2019-06-26 DIAGNOSIS — E1122 Type 2 diabetes mellitus with diabetic chronic kidney disease: Secondary | ICD-10-CM

## 2019-06-26 DIAGNOSIS — N183 Chronic kidney disease, stage 3 (moderate): Secondary | ICD-10-CM

## 2019-06-26 DIAGNOSIS — I25812 Atherosclerosis of bypass graft of coronary artery of transplanted heart without angina pectoris: Secondary | ICD-10-CM

## 2019-06-26 DIAGNOSIS — I10 Essential (primary) hypertension: Secondary | ICD-10-CM

## 2019-06-26 DIAGNOSIS — I951 Orthostatic hypotension: Secondary | ICD-10-CM

## 2019-06-26 DIAGNOSIS — E559 Vitamin D deficiency, unspecified: Secondary | ICD-10-CM | POA: Diagnosis not present

## 2019-06-26 NOTE — Progress Notes (Signed)
History of Present Illness:      This very nice 84 y.o. WWM presents for 3 month follow up with HTN / Postural Hypotension, ASCAD s/p CABG, HLD and Vitamin D Deficiency. Patient has remote hx/o T2_DM resolving with weight loss. Patient's GERD is quiescent GERD. Patient has hx /o recurrent Kidney stones (over 200) ans colonic polyposis.       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  Standing BP's are maintained on Florinef. Today's BP is at goal -  120/66.  Patient is s/p CABG x 2 V in 1996. Patient has had no complaints of any cardiac type chest pain, palpitations, dyspnea / orthopnea / PND, dizziness, claudication, or dependent edema.      Hyperlipidemia is controlled with diet & meds. Patient denies myalgias or other med SE's. Last Lipids were at goal: Lab Results  Component Value Date   CHOL 176 12/18/2018   HDL 54 12/18/2018   LDLCALC 99 12/18/2018   TRIG 126 12/18/2018   CHOLHDL 3.3 12/18/2018       Also, the patient has hx/o T2_DM  (1995) resolving with weight losson Metformin which was d/c'd in 2008. [name] have re,mained Normal since and he has had no symptoms of reactive hypoglycemia, diabetic polys, paresthesias or visual blurring.  Last A1c was Normal & at goal: Lab Results  Component Value Date   HGBA1C 5.3 12/18/2018       Further, the patient also has history of Vitamin D Deficiency ("18" / 2008) and supplements vitamin D without any suspected side-effects. Last vitamin D was at goal: Lab Results  Component Value Date   VD25OH 60 12/18/2018   Current Outpatient Medications on File Prior to Visit  Medication Sig  . ALPRAZolam (XANAX) 1 MG tablet Take 1/2 to 1 tablet at Bedtime ONLY if needed for Sleep and please try to limit to 5 days /week to avoid addiction  . aspirin 81 MG tablet Take 81 mg by  mouth daily.    . Cholecalciferol (VITAMIN D3) 2000 UNITS capsule Take 8,000 Units by mouth daily.   . clopidogrel (PLAVIX) 75 MG tablet TAKE 1 TABLET BY MOUTH ONCE DAILY  . escitalopram (LEXAPRO) 10 MG tablet Take 1 tablet daily for anxiety  . finasteride (PROSCAR) 5 MG tablet Take 1 tablet daily for prostate  . fludrocortisone (FLORINEF) 0.1 MG tablet TAKE 1 TABLET BY MOUTH TWICE DAILY  . glucose blood (FREESTYLE LITE) test strip TEST BLOOD SUGAR ONCE DAILY  . Lancets (FREESTYLE) lancets 1 each by Other route as needed for other. Use as instructed  . Melatonin 5 MG CAPS Take 10 mg by mouth at bedtime.   . nitroGLYCERIN (NITROSTAT) 0.4 MG SL tablet Sig: 1 tablet under tongue every 3 to 5 minutes as needed for Angina - Please Dispense # 2 bottles of # 25 tabs  . pantoprazole (PROTONIX) 40 MG tablet TAKE ONE TABLET BY MOUTH ONCE DAILY  . potassium citrate (UROCIT-K) 10 MEQ (1080 MG) SR tablet TAKE ONE TABLET BY MOUTH TWICE DAILY  . prochlorperazine (COMPAZINE) 5 MG tablet Take 1 tablet 3 x /day if needed for nausea  . QUEtiapine (SEROQUEL) 25 MG tablet Take 1 tablet 3 x /day  . silodosin (RAPAFLO) 8 MG CAPS capsule Take 1 capsule at Bedtime for Prostate  . simvastatin (ZOCOR) 80 MG tablet  TAKE ONE TABLET BY MOUTH AT BEDTIME   No current facility-administered medications on file prior to visit.     Allergies  Allergen Reactions  . Gabapentin Other (See Comments)    Severe confusion  . Prozac [Fluoxetine Hcl] Other (See Comments)    Patient state that it does not agree with him  . Soma [Carisoprodol] Other (See Comments)    Patient stated that it does not agree with him  . Beta Adrenergic Blockers Other (See Comments)    REACTION: Bradycardia   PMHx:   Past Medical History:  Diagnosis Date  . Anxiety   . Arthritis    "lower back; left ankle" (03/20/2014)  . ASTHMA   . Blood transfusion    "related to OHS, ulcers"  . CAD (coronary artery disease)    a. s/p 2V CABG 1996. b. Last  cath 12/2016 -> patent grafts, med rx.  . CAD, ARTERY BYPASS GRAFT   . Cataract   . CKD (chronic kidney disease), stage II   . Colon polyps    adenomatous polyps  . COPD   . Depression   . Diabetes mellitus without complication ([place])   . Diverticulosis   . Duodenal ulcer without hemorrhage or perforation 12/29/2011  . GASTROESOPHAGEAL REFLUX DISEASE   . HYPERLIPIDEMIA   . Insomnia   . 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 Memory loss   . Nephrolithiasis    "I've had over 320 kidney stones" (03/20/2014)  . NEPHROLITHIASIS, HX OF 03/12/2009  . Parkinsonism ([place])   . Sleep apnea    "don't always wear it" (03/20/2014)  . Stroke [facility])    TIA  . SUPRAVENTRICULAR TACHYCARDIA, HX OF   . SVT (supraventricular tachycardia) ([place]) 03/12/2009  . TIA   . Ulcer    Immunization History  Administered Date(s) Administered  . Influenza, High Dose Seasonal PF 08/13/2013, 08/05/2014, 07/23/2015, 06/22/2016, 07/18/2017, 07/17/2018  . Pneumococcal Conjugate-13 08/27/2014  . Pneumococcal Polysaccharide-23 08/06/2012  . Tdap 08/01/2013  . Zoster 05/02/2006   Past Surgical History:  Procedure Laterality Date  . ANKLE FRACTURE SURGERY Left ~ 2012  . CARDIAC CATHETERIZATION    . COLONOSCOPY    . CORONARY ARTERY BYPASS GRAFT  1996   CABG X2  . CYSTOSCOPY WITH URETEROSCOPY, STONE BASKETRY AND STENT PLACEMENT    . Cystourethroscopy with stent removal.    . EXCISIONAL HEMORRHOIDECTOMY    . GASTRECTOMY    . HIATAL HERNIA REPAIR    . IRRIGATION AND DEBRIDEMENT SEBACEOUS CYST     "off my back  . LEFT HEART CATH AND CORS/GRAFTS ANGIOGRAPHY N/A 12/23/2016   Procedure: Left Heart Cath and Cors/Grafts Angiography;  Surgeon: Burnell Blanks, MD;  Location: [facility];  Service: Cardiovascular;  Laterality: N/A;  . POLYPECTOMY    . PROSTATE SURGERY     "took the center of my prostate out"  . UPPER GASTROINTESTINAL ENDOSCOPY    . VAGOTOMY     FHx:    Reviewed / unchanged  SHx:    Reviewed / unchanged    Systems Review:  Constitutional: Denies fever, chills, wt changes, headaches, insomnia, fatigue, night sweats, change in appetite. Eyes: Denies redness, blurred vision, diplopia, discharge, itchy, watery eyes.  ENT: Denies discharge, congestion, post nasal drip, epistaxis, sore throat, earache, hearing loss, dental pain, tinnitus, vertigo, sinus pain, snoring.  CV: Denies chest pain, palpitations, irregular heartbeat, syncope, dyspnea, diaphoresis, orthopnea, PND, claudication or edema. Respiratory: denies cough, dyspnea, DOE, pleurisy, hoarseness, laryngitis, wheezing.  Gastrointestinal: Denies dysphagia, odynophagia,  heartburn, reflux, water brash, abdominal pain or cramps, nausea, vomiting, bloating, diarrhea, constipation, hematemesis, melena, hematochezia  or hemorrhoids. Genitourinary: Denies dysuria, frequency, urgency, nocturia, hesitancy, discharge, hematuria or flank pain. Musculoskeletal: Denies arthralgias, myalgias, stiffness, jt. swelling, pain, limping or strain/sprain.  Skin: Denies pruritus, rash, hives, warts, acne, eczema or change in skin lesion(s). Neuro: No weakness, tremor, incoordination, spasms, paresthesia or pain. Psychiatric: Denies confusion, memory loss or sensory loss. Endo: Denies change in weight, skin or hair change.  Heme/Lymph: No excessive bleeding, bruising or enlarged lymph nodes.  Physical Exam  BP 120/66   Pulse 80   Temp (!) 97.4 F (36.3 C)   Resp 16   Ht 5\' 11"  (1.803 m)   Wt 166 lb (75.3 kg)   BMI 23.15 kg/m   Appears  well nourished, well groomed  and in no distress.  Eyes: PERRLA, EOMs, conjunctiva no swelling or erythema. Sinuses: No frontal/maxillary tenderness ENT/Mouth: EAC's clear, TM's nl w/o erythema, bulging. Nares clear w/o erythema, swelling, exudates. Oropharynx clear without erythema or exudates. Oral hygiene is good. Tongue normal, non obstructing. Hearing intact.  Neck: Supple. Thyroid not palpable. Car 2+/2+ without  bruits, nodes or JVD. Chest: Respirations nl with BS clear & equal w/o rales, rhonchi, wheezing or stridor.  Cor: Heart sounds normal w/ regular rate and rhythm without sig. murmurs, gallops, clicks or rubs. Peripheral pulses normal and equal  without edema.  Abdomen: Soft & bowel sounds normal. Non-tender w/o guarding, rebound, hernias, masses or organomegaly.  Lymphatics: Unremarkable.  Musculoskeletal: Full ROM all peripheral extremities, joint stability, 5/5 strength and normal gait.  Skin: Warm, dry without exposed rashes, lesions or ecchymosis apparent.  Neuro: Cranial nerves intact, reflexes equal bilaterally. Sensory-motor testing grossly intact. Tendon reflexes grossly intact.  Pysch: Alert & oriented x 3.  Insight and judgement nl & appropriate. No ideations.  Assessment and Plan:  1. Essential hypertension  - Continue medication, monitor blood pressure at home.  - Continue DASH diet.  Reminder to go to the ER if any CP,  SOB, nausea, dizziness, severe HA, changes vision/speech.  - CBC with Differential/Platelet - COMPLETE METABOLIC PANEL WITH GFR - Magnesium - TSH  2. Autonomic postural hypotension  3. Hyperlipidemia, mixed  - Lipid panel - TSH  - Continue diet/meds, exercise,& lifestyle modifications.  - Continue monitor periodic cholesterol/liver & renal functions   4. Type 2 diabetes mellitus with stage 3 chronic kidney disease,  without long-term current use of insulin (HCC)  - Continue diet, exercise  - Lifestyle modifications.  - Monitor appropriate labs.  - Hemoglobin A1c - Insulin, random  5. Vitamin D deficiency  - Continue supplementation.  - VITAMIN D 25 Hydroxyl  6. Atherosclerosis of coronary artery bypass graft  - Lipid panel  7. Medication management  - CBC with Differential/Platelet - COMPLETE METABOLIC PANEL WITH GFR - Magnesium - Lipid panel - TSH - Hemoglobin A1c - VITAMIN D 25 Hydroxyl - Insulin, random       Discussed   regular exercise, BP monitoring, weight control to achieve/maintain BMI less than 25 and discussed med and SE's. Recommended labs to assess and monitor clinical status with further disposition pending results of labs.  I discussed the assessment and treatment plan with the patient. The patient was provided an opportunity to ask questions and all were answered. The patient agreed with the plan and demonstrated an understanding of the instructions.  I provided over 30 minutes of exam, counseling, chart review and  complex critical decision making.  Kirtland Bouchard, MD

## 2019-06-26 NOTE — Patient Instructions (Signed)

## 2019-06-27 LAB — COMPLETE METABOLIC PANEL WITH GFR
AG Ratio: 1.5 (calc) (ref 1.0–2.5)
ALT: 25 U/L (ref 9–46)
AST: 29 U/L (ref 10–35)
Albumin: 3.8 g/dL (ref 3.6–5.1)
Alkaline phosphatase (APISO): 60 U/L (ref 35–144)
BUN/Creatinine Ratio: 10 (calc) (ref 6–22)
BUN: 12 mg/dL (ref 7–25)
CO2: 25 mmol/L (ref 20–32)
Calcium: 9.1 mg/dL (ref 8.6–10.3)
Chloride: 111 mmol/L — ABNORMAL HIGH (ref 98–110)
Creat: 1.26 mg/dL — ABNORMAL HIGH (ref 0.70–1.11)
GFR, Est African American: 61 mL/min/{1.73_m2} (ref >=60)
GFR, Est Non African American: 53 mL/min/{1.73_m2} — ABNORMAL LOW (ref >=60)
Globulin: 2.6 g/dL (calc) (ref 1.9–3.7)
Glucose, Bld: 135 mg/dL — ABNORMAL HIGH (ref 65–99)
Potassium: 4.3 mmol/L (ref 3.5–5.3)
Sodium: 145 mmol/L (ref 135–146)
Total Bilirubin: 0.4 mg/dL (ref 0.2–1.2)
Total Protein: 6.4 g/dL (ref 6.1–8.1)

## 2019-06-27 LAB — CBC WITH DIFFERENTIAL/PLATELET
Absolute Monocytes: 299 cells/uL (ref 200–950)
Basophils Absolute: 39 cells/uL (ref 0–200)
Basophils Relative: 0.8 %
Eosinophils Absolute: 98 cells/uL (ref 15–500)
Eosinophils Relative: 2 %
HCT: 40.5 % (ref 38.5–50.0)
Hemoglobin: 13.7 g/dL (ref 13.2–17.1)
Lymphs Abs: 1338 cells/uL (ref 850–3900)
MCH: 31.8 pg (ref 27.0–33.0)
MCHC: 33.8 g/dL (ref 32.0–36.0)
MCV: 94 fL (ref 80.0–100.0)
MPV: 10.3 fL (ref 7.5–12.5)
Monocytes Relative: 6.1 %
Neutro Abs: 3126 cells/uL (ref 1500–7800)
Neutrophils Relative %: 63.8 %
Platelets: 209 10*3/uL (ref 140–400)
RBC: 4.31 10*6/uL (ref 4.20–5.80)
RDW: 13 % (ref 11.0–15.0)
Total Lymphocyte: 27.3 %
WBC: 4.9 10*3/uL (ref 3.8–10.8)

## 2019-06-27 LAB — LIPID PANEL
Cholesterol: 165 mg/dL (ref <200)
HDL: 60 mg/dL (ref >=40)
LDL Cholesterol (Calc): 84 mg/dL (calc)
Non-HDL Cholesterol (Calc): 105 mg/dL (calc) (ref <130)
Total CHOL/HDL Ratio: 2.8 (calc) (ref <5.0)
Triglycerides: 115 mg/dL (ref <150)

## 2019-06-27 LAB — MAGNESIUM: Magnesium: 1.9 mg/dL (ref 1.5–2.5)

## 2019-06-27 LAB — HEMOGLOBIN A1C
Hgb A1c MFr Bld: 5.5 % of total Hgb (ref <5.7)
Mean Plasma Glucose: 111 (calc)
eAG (mmol/L): 6.2 (calc)

## 2019-06-27 LAB — INSULIN, RANDOM: Insulin: 48.1 u[IU]/mL — ABNORMAL HIGH

## 2019-06-27 LAB — TSH: TSH: 1.66 mIU/L (ref 0.40–4.50)

## 2019-06-27 LAB — VITAMIN D 25 HYDROXY (VIT D DEFICIENCY, FRACTURES): Vit D, 25-Hydroxy: 59 ng/mL (ref 30–100)

## 2019-07-01 ENCOUNTER — Encounter: Payer: Self-pay | Admitting: Internal Medicine

## 2019-07-04 ENCOUNTER — Other Ambulatory Visit: Payer: Self-pay | Admitting: Internal Medicine

## 2019-07-04 DIAGNOSIS — F419 Anxiety disorder, unspecified: Secondary | ICD-10-CM

## 2019-07-04 MED ORDER — ALPRAZOLAM 1 MG PO TABS: ORAL_TABLET | ORAL | 0 refills | Status: DC

## 2019-07-17 ENCOUNTER — Other Ambulatory Visit: Payer: Self-pay

## 2019-07-17 ENCOUNTER — Encounter: Payer: Self-pay | Admitting: Family Medicine

## 2019-07-17 ENCOUNTER — Ambulatory Visit (INDEPENDENT_AMBULATORY_CARE_PROVIDER_SITE_OTHER): Payer: PPO | Admitting: Family Medicine

## 2019-07-17 DIAGNOSIS — M25562 Pain in left knee: Secondary | ICD-10-CM | POA: Diagnosis not present

## 2019-07-17 NOTE — Progress Notes (Signed)
I saw and examined the patient with Dr. Mayer Masker and agree with assessment and plan as outlined.    Recurrent left knee pain with small effusion.  Re-inject today, return as needed.

## 2019-07-17 NOTE — Progress Notes (Addendum)
John [name] - 84 y.o. male MRN [id number]  Date of birth: 24-Sep-1935  Office Visit Note: Visit Date: 07/17/2019 PCP: Unk Pinto, MD Referred by: Unk Pinto, MD  Subjective: Chief Complaint  Patient presents with  . Left Knee - Pain    Pain started again in the anterior aspect of the knee 10 days ago (was in posterior knee when he was seen in January of this year). No new injury.Wearing hinged knee brace - helps some.    HPI: John [name] is a 84 y.o. male who comes in today for left knee pain. Was last seen  11/24/2018 and had a steroid injection in knee that gave him significant pain relief. His knee pain started hurting 3-4 days ago. Pain is now in anterior knee- at last appointment, pain was along posterior knee. No numbness/tingling. Wearing a brace. Requesting a repeat knee injection.  Otherwise per HPI.  Assessment & Plan: Visit Diagnoses:  1. Acute pain of left knee     Plan:  - corticosteroid injection in left knee - follow up PRN  Procedures: .Procedure performed: knee intraarticular corticosteroid injection; palpation guided  Consent obtained and verified. Time-out conducted. Noted no overlying erythema, induration, or other signs of local infection. The superior lateral patellar joint space was palpated and marked. The overlying skin was prepped in a sterile fashion. Topical analgesic spray: Ethyl chloride. Joint: left knee Needle: 25 G 1.5 needle Completed without difficulty. Meds: 5 mL lidocaine, 40 mg methylpred   Advised to call if fevers/chills, erythema, induration, drainage, or persistent bleeding.   Clinical History: No specialty comments available.   He reports that he quit smoking about 56 years ago. His smoking use included cigarettes. He smoked 0.50 packs per day. He quit smokeless tobacco use about 15 months ago.  His smokeless tobacco use included chew.  Recent Labs    12/18/18 1102 06/26/19 0000  HGBA1C 5.3 5.5     Objective:  VS:  HT:    WT:   BMI:     BP:   HR: bpm  TEMP: ( )  RESP:  Physical Exam  PHYSICAL EXAM: Gen: NAD, alert, cooperative with exam, well-appearing HEENT: clear conjunctiva,  CV:  no edema, capillary refill brisk, normal rate Resp: non-labored Skin: no rashes, normal turgor  Neuro: no gross deficits.  Psych:  alert and oriented  Ortho Exam  Left Knee: - Inspection: no gross deformity, trace effusion - Palpation: TTP along medial and lateral patella  - ROM: full active ROM with flexion and extension in knee and hip - Strength: 5/5 strength - Neuro/vasc: NV intact - Special Tests: - LIGAMENTS: negative anterior and posterior drawer, negative Lachman's, no MCL or LCL laxity  -- MENISCUS: negative McMurray's, negative Thessaly  -- PF JOINT: nml patellar mobility bilaterally.   Imaging: No results found.  Past Medical/Family/Surgical/Social History: Medications & Allergies reviewed per EMR, new medications updated. Patient Active Problem List   Diagnosis Date Noted  . Insomnia 02/19/2018  . Diabetic peripheral neuropathy ([place]) 11/21/2017  . BMI 21.0-21.9, adult 09/04/2015  . Esophageal reflux 07/09/2015  . Autonomic postural hypotension 02/06/2015  . CKD stage G3a/A1, GFR 45-59 and albumin creatinine ratio <30 mg/g (HCC) 03/20/2014  . Medication management 02/06/2014  . Vitamin D deficiency 02/06/2014  . Gastric AV malformation 10/30/2012  . History of colonic polyps 09/14/2011  . Mixed hyperlipidemia 03/12/2009  . Essential hypertension 03/12/2009  . ASCAD/CABG 03/12/2009  . History of TIA (transient ischemic attack) 03/12/2009  .  Asthma 03/12/2009  . COPD (chronic obstructive pulmonary disease) ([place]) 03/12/2009  . GERD 03/12/2009   Past Medical History:  Diagnosis Date  . Anxiety   . Arthritis    "lower back; left ankle" (03/20/2014)  . ASTHMA   . Blood transfusion    "related to OHS, ulcers"  . CAD (coronary artery disease)    a. s/p 2V CABG 1996.  b. Last cath 12/2016 -> patent grafts, med rx.  . CAD, ARTERY BYPASS GRAFT   . Cataract   . CKD (chronic kidney disease), stage II   . Colon polyps    adenomatous polyps  . COPD   . Depression   . Diabetes mellitus without complication ([place])   . Diverticulosis   . Duodenal ulcer without hemorrhage or perforation 12/29/2011  . GASTROESOPHAGEAL REFLUX DISEASE   . HYPERLIPIDEMIA   . Insomnia   . Leukodystrophy ([place])   . Memory loss   . Nephrolithiasis    "I've had over 320 kidney stones" (03/20/2014)  . NEPHROLITHIASIS, HX OF 03/12/2009  . Parkinsonism ([place])   . Sleep apnea    "don't always wear it" (03/20/2014)  . Stroke [facility])    TIA  . SUPRAVENTRICULAR TACHYCARDIA, HX OF   . SVT (supraventricular tachycardia) ([place]) 03/12/2009  . TIA   . Ulcer    Family History  Problem Relation Age of Onset  . Alcohol abuse Father   . Stroke Mother   . Heart disease Brother   . Diabetes Brother   . Heart disease Paternal Grandmother   . Colon cancer Neg Hx   . Stomach cancer Neg Hx   . Esophageal cancer Neg Hx   . Rectal cancer Neg Hx    Past Surgical History:  Procedure Laterality Date  . ANKLE FRACTURE SURGERY Left ~ 2012  . CARDIAC CATHETERIZATION    . COLONOSCOPY    . CORONARY ARTERY BYPASS GRAFT  1996   CABG X2  . CYSTOSCOPY WITH URETEROSCOPY, STONE BASKETRY AND STENT PLACEMENT    . Cystourethroscopy with stent removal.    . EXCISIONAL HEMORRHOIDECTOMY    . GASTRECTOMY    . HIATAL HERNIA REPAIR    . IRRIGATION AND DEBRIDEMENT SEBACEOUS CYST     "off my back  . LEFT HEART CATH AND CORS/GRAFTS ANGIOGRAPHY N/A 12/23/2016   Procedure: Left Heart Cath and Cors/Grafts Angiography;  Surgeon: Burnell Blanks, MD;  Location: [place] [facility];  Service: Cardiovascular;  Laterality: N/A;  . POLYPECTOMY    . PROSTATE SURGERY     "took the center of my prostate out"  . UPPER GASTROINTESTINAL ENDOSCOPY    . VAGOTOMY     Social History   Occupational History  . 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 Smoking status: Former Smoker    Packs/day: 0.50    Types: Cigarettes    Quit date: 10/25/1962    Years since quitting: 56.7  . Smokeless tobacco: Former Systems developer    Types: [place] date: 04/15/2018  Substance and Sexual Activity  . Alcohol use: No    Alcohol/week: 0.0 standard drinks  . Drug use: No  . Sexual activity: Yes

## 2019-07-20 ENCOUNTER — Ambulatory Visit: Payer: Self-pay

## 2019-07-20 ENCOUNTER — Encounter: Payer: Self-pay | Admitting: Orthopaedic Surgery

## 2019-07-20 ENCOUNTER — Other Ambulatory Visit: Payer: Self-pay

## 2019-07-20 ENCOUNTER — Ambulatory Visit (INDEPENDENT_AMBULATORY_CARE_PROVIDER_SITE_OTHER): Payer: PPO | Admitting: Orthopaedic Surgery

## 2019-07-20 DIAGNOSIS — G8929 Other chronic pain: Secondary | ICD-10-CM

## 2019-07-20 DIAGNOSIS — M25562 Pain in left knee: Secondary | ICD-10-CM | POA: Diagnosis not present

## 2019-07-20 DIAGNOSIS — M79605 Pain in left leg: Secondary | ICD-10-CM | POA: Diagnosis not present

## 2019-07-20 MED ORDER — METHYLPREDNISOLONE 4 MG PO TBPK: ORAL_TABLET | ORAL | 0 refills | Status: DC

## 2019-07-20 NOTE — Progress Notes (Signed)
Office Visit Note   Patient: John [name]           Date of Birth: 17-Dec-1934           MRN: [id number] Visit Date: 07/20/2019              Requested by: John [name], John [name],  John [name] [address] PCP: John Pinto, MD   Assessment & Plan: Visit Diagnoses:  1. Chronic pain of left knee     Plan: Impression is left knee osteoarthritis.  At this point, not concerned for left knee infection or pain referred from his left hip joint.  We will start him on a steroid taper.  I encouraged rest and ice.  If he is still having pain in a week and a half or so from now when he is 2 weeks out from injection he will call us and we will get MRI of the left knee to assess for stress fracture or other abnormalities.  Follow-Up Instructions: Return for in one week if still not better.   Orders:  Orders Placed This Encounter  Procedures  . XR Pelvis 1-2 Views  . XR Knee Complete 4 Views Left   Meds ordered this encounter  Medications  . methylPREDNISolone (MEDROL DOSEPAK) 4 MG TBPK tablet    Sig: Take as directed    Dispense:  21 tablet    Refill:  0      Procedures: No procedures performed   Clinical Data: No additional findings.   Subjective: Chief Complaint  Patient presents with  . Left Leg - Pain    HPI patient is a pleasant 84 year old gentleman who presents our clinic today with recurrent left knee pain.  History of osteoarthritis.  He has had his left knee injected twice with the last one being this past Tuesday, 07/17/2019.  This was here in our office by John [name].  He did not notice any relief during the anesthetic phase and his pain is worsened to the point where he is unable to get out of bed without his walker yesterday morning.  No fevers or chills.  All of his pain is to the anterior aspect.  He is noticing giving way sensation secondary to the pain.  He denies any numbness, tingling or burning.  He denies any pain to the  groin, anterior thigh or posterior leg.  Review of Systems as detailed in HPI.  All others reviewed and are negative.   Objective: Vital Signs: There were no vitals taken for this visit.  Physical Exam well-developed and well-nourished gentleman in no acute distress.  Alert and oriented x3.  Ortho Exam examination of his left knee reveals range of motion from 0 to 110 degrees.  He has medial joint line tenderness.  Mild patellofemoral crepitus.  Ligaments are stable to varus and valgus stress.  He is neurovascular intact distally.  He has a negative straight leg raise.  He has pain to the knee with internal rotation of the hip.  Specialty Comments:  No specialty comments available.  Imaging: Xr Knee Complete 4 Views Left  Result Date: 07/20/2019 X-rays reveal moderate tricompartmental degenerative changes.  No acute findings  Xr Pelvis 1-2 Views  Result Date: 07/20/2019 Mild degenerative changes    PMFS History: Patient Active Problem List   Diagnosis Date Noted  . Insomnia 02/19/2018  . Diabetic peripheral neuropathy ([place]) 11/21/2017  . BMI 21.0-21.9, adult 09/04/2015  . Esophageal reflux 07/09/2015  . Autonomic postural  hypotension 02/06/2015  . CKD stage G3a/A1, GFR 45-59 and albumin creatinine ratio <30 mg/g (HCC) 03/20/2014  . Medication management 02/06/2014  . Vitamin D deficiency 02/06/2014  . Gastric AV malformation 10/30/2012  . History of colonic polyps 09/14/2011  . Mixed hyperlipidemia 03/12/2009  . Essential hypertension 03/12/2009  . ASCAD/CABG 03/12/2009  . History of TIA (transient ischemic attack) 03/12/2009  . Asthma 03/12/2009  . COPD (chronic obstructive pulmonary disease) ([place]) 03/12/2009  . GERD 03/12/2009   Past Medical History:  Diagnosis Date  . Anxiety   . Arthritis    "lower back; left ankle" (03/20/2014)  . ASTHMA   . Blood transfusion    "related to OHS, ulcers"  . CAD (coronary artery disease)    a. s/p 2V CABG 1996. b. Last cath  12/2016 -> patent grafts, med rx.  . CAD, ARTERY BYPASS GRAFT   . Cataract   . CKD (chronic kidney disease), stage II   . Colon polyps    adenomatous polyps  . COPD   . Depression   . Diabetes mellitus without complication ([place])   . Diverticulosis   . Duodenal ulcer without hemorrhage or perforation 12/29/2011  . GASTROESOPHAGEAL REFLUX DISEASE   . HYPERLIPIDEMIA   . Insomnia   . Leukodystrophy ([place])   . Memory loss   . Nephrolithiasis    "I've had over 320 kidney stones" (03/20/2014)  . NEPHROLITHIASIS, HX OF 03/12/2009  . Parkinsonism ([place])   . Sleep apnea    "don't always wear it" (03/20/2014)  . Stroke [facility])    TIA  . SUPRAVENTRICULAR TACHYCARDIA, HX OF   . SVT (supraventricular tachycardia) ([place]) 03/12/2009  . TIA   . Ulcer     Family History  Problem Relation Age of Onset  . Alcohol abuse Father   . Stroke Mother   . Heart disease Brother   . Diabetes Brother   . Heart disease Paternal Grandmother   . Colon cancer Neg Hx   . Stomach cancer Neg Hx   . Esophageal cancer Neg Hx   . Rectal cancer Neg Hx     Past Surgical History:  Procedure Laterality Date  . ANKLE FRACTURE SURGERY Left ~ 2012  . CARDIAC CATHETERIZATION    . COLONOSCOPY    . CORONARY ARTERY BYPASS GRAFT  1996   CABG X2  . CYSTOSCOPY WITH URETEROSCOPY, STONE BASKETRY AND STENT PLACEMENT    . Cystourethroscopy with stent removal.    . EXCISIONAL HEMORRHOIDECTOMY    . GASTRECTOMY    . HIATAL HERNIA REPAIR    . IRRIGATION AND DEBRIDEMENT SEBACEOUS CYST     "off my back  . LEFT HEART CATH AND CORS/GRAFTS ANGIOGRAPHY N/A 12/23/2016   Procedure: Left Heart Cath and Cors/Grafts Angiography;  Surgeon: John Blanks, MD;  Location: [place] [facility];  Service: Cardiovascular;  Laterality: N/A;  . POLYPECTOMY    . PROSTATE SURGERY     "took the center of my prostate out"  . UPPER GASTROINTESTINAL ENDOSCOPY    . VAGOTOMY     Social History   Occupational History  . 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 Smoking status: Former Smoker    Packs/day: 0.50    Types: Cigarettes    Quit date: 10/25/1962    Years since quitting: 56.7  . Smokeless tobacco: Former Systems developer    Types: [place] date: 04/15/2018  Substance and Sexual Activity  . Alcohol use:  No    Alcohol/week: 0.0 standard drinks  . Drug use: No  . Sexual activity: Yes

## 2019-07-26 ENCOUNTER — Other Ambulatory Visit: Payer: Self-pay

## 2019-07-26 ENCOUNTER — Telehealth: Payer: Self-pay | Admitting: Orthopaedic Surgery

## 2019-07-26 DIAGNOSIS — M25562 Pain in left knee: Secondary | ICD-10-CM

## 2019-07-26 DIAGNOSIS — G8929 Other chronic pain: Secondary | ICD-10-CM

## 2019-07-26 NOTE — Telephone Encounter (Signed)
Pt called in said he is still having a lot of pain in his left leg since his last appt with dr.xu and they did discuss at the appt that if the pain continued dr.xu wanted to do an mri and pt would like to proceed with that.    [phone]

## 2019-07-26 NOTE — Telephone Encounter (Signed)
See message.

## 2019-07-26 NOTE — Telephone Encounter (Signed)
Yes MRI r/o structural abnormalities.

## 2019-07-26 NOTE — Telephone Encounter (Signed)
Order made

## 2019-07-27 ENCOUNTER — Telehealth: Payer: Self-pay | Admitting: Orthopaedic Surgery

## 2019-07-27 NOTE — Telephone Encounter (Signed)
I called patient and advised, message from previous call has been sent to [facility] to see if she can get him in to a different facility sooner. He states that he can hardly walk on his leg unless using his walker and he appreciates any help that he could be given to get him in somewhere sooner.

## 2019-07-27 NOTE — Telephone Encounter (Signed)
Patient called. Says he can not have the MRI until 10/17. Does not want to wait. Would like for someone to call him. [phone]

## 2019-07-27 NOTE — Telephone Encounter (Signed)
Please see below. Would you be able to get patient scheduled sooner at another location?  If not, we can check with Dr. Erlinda Hong to see if he needs to see him back in the office prior to MRI. Thanks.

## 2019-07-27 NOTE — Telephone Encounter (Signed)
Patient called stating that John [name] can not get him in until October 17th.  He wants to know if Dr. Erlinda Hong would want him to come back in to be seen about his leg or if he could get the MRI sooner somewhere else.  CB#[phone].  Thank you.

## 2019-07-31 NOTE — Telephone Encounter (Signed)
I sent message to [facility] with scheduling, they will contact pt to schedule appt

## 2019-08-02 ENCOUNTER — Other Ambulatory Visit: Payer: Self-pay

## 2019-08-02 ENCOUNTER — Ambulatory Visit (INDEPENDENT_AMBULATORY_CARE_PROVIDER_SITE_OTHER): Payer: PPO

## 2019-08-02 DIAGNOSIS — Z23 Encounter for immunization: Secondary | ICD-10-CM

## 2019-08-02 NOTE — Progress Notes (Signed)
HD FLU GIVEN

## 2019-08-06 ENCOUNTER — Other Ambulatory Visit: Payer: Self-pay | Admitting: Internal Medicine

## 2019-08-07 ENCOUNTER — Ambulatory Visit (HOSPITAL_COMMUNITY)
Admission: RE | Admit: 2019-08-07 | Discharge: 2019-08-07 | Disposition: A | Discharge status: Home / Self Care | Payer: PPO | Source: Ambulatory Visit | Attending: Orthopaedic Surgery | Admitting: Orthopaedic Surgery

## 2019-08-07 ENCOUNTER — Other Ambulatory Visit: Payer: Self-pay

## 2019-08-07 DIAGNOSIS — G8929 Other chronic pain: Secondary | ICD-10-CM | POA: Diagnosis not present

## 2019-08-07 DIAGNOSIS — M25562 Pain in left knee: Secondary | ICD-10-CM | POA: Diagnosis not present

## 2019-08-09 ENCOUNTER — Other Ambulatory Visit: Payer: Self-pay

## 2019-08-09 ENCOUNTER — Ambulatory Visit (INDEPENDENT_AMBULATORY_CARE_PROVIDER_SITE_OTHER): Payer: PPO | Admitting: Orthopaedic Surgery

## 2019-08-09 ENCOUNTER — Encounter: Payer: Self-pay | Admitting: Orthopaedic Surgery

## 2019-08-09 VITALS — Ht 71.0 in | Wt 166.0 lb

## 2019-08-09 DIAGNOSIS — S83242A Other tear of medial meniscus, current injury, left knee, initial encounter: Secondary | ICD-10-CM

## 2019-08-09 NOTE — Progress Notes (Signed)
Office Visit Note   Patient: John [name]           Date of Birth: 22-Nov-1934           MRN: [id number] Visit Date: 08/09/2019              Requested by: Unk Pinto, [address],  [place] [address] PCP: Unk Pinto, MD   Assessment & Plan: Visit Diagnoses:  1. Acute medial meniscus tear of left knee, initial encounter     Plan: Impression is acute left medial meniscus tear of the posterior horn.  Patient was fairly symptomatic from this tear and he has now been as active as a result.  Cortisone injection has not given him much relief.  Rest does not give him much relief.  MRI findings were reviewed with the patient in detail.  Recommendation is for arthroscopic partial medial meniscectomy.  Risks and benefits and rehab discussed with the patient.  He is agreeable to proceed with surgery.  We will obtain preoperative medical clearance.  He is on Plavix which she will come off of a week in advance.  Questions encouraged and answered.  Follow-Up Instructions: Return for 1 week postop visit.   Orders:  No orders of the defined types were placed in this encounter.  No orders of the defined types were placed in this encounter.     Procedures: No procedures performed   Clinical Data: No additional findings.   Subjective: Chief Complaint  Patient presents with  . Left Knee - Follow-up    MRI results    Asa returns today for review of his left knee MRI.  He states that his pain is still fairly severe and he is having to ambulate with a walker.  The previous cortisone injection gave him only 2 to 3 days ago.   Review of Systems  Constitutional: Negative.   All other systems reviewed and are negative.    Objective: Vital Signs: Ht 5\' 11"  (1.803 m)   Wt 166 lb (75.3 kg)   BMI 23.15 kg/m   Physical Exam Vitals signs and nursing note reviewed.  Constitutional:      Appearance: He is well-developed.  Pulmonary:   Effort: Pulmonary effort is normal.  Abdominal:     Palpations: Abdomen is soft.  Skin:    General: Skin is warm.  Neurological:     Mental Status: He is alert and oriented to person, place, and time.  Psychiatric:        Behavior: Behavior normal.        Thought Content: Thought content normal.        Judgment: Judgment normal.     Ortho Exam Left knee exam shows a trace joint effusion.  He has medial sided knee pain and medial joint tenderness. Specialty Comments:  No specialty comments available.  Imaging: No results found.   PMFS History: Patient Active Problem List   Diagnosis Date Noted  . Acute medial meniscus tear of left knee 08/09/2019  . Insomnia 02/19/2018  . Diabetic peripheral neuropathy ([place]) 11/21/2017  . BMI 21.0-21.9, adult 09/04/2015  . Esophageal reflux 07/09/2015  . Autonomic postural hypotension 02/06/2015  . CKD stage G3a/A1, GFR 45-59 and albumin creatinine ratio <30 mg/g 03/20/2014  . Medication management 02/06/2014  . Vitamin D deficiency 02/06/2014  . Gastric AV malformation 10/30/2012  . History of colonic polyps 09/14/2011  . Mixed hyperlipidemia 03/12/2009  . Essential hypertension 03/12/2009  . ASCAD/CABG 03/12/2009  .  History of TIA (transient ischemic attack) 03/12/2009  . Asthma 03/12/2009  . COPD (chronic obstructive pulmonary disease) ([place]) 03/12/2009  . GERD 03/12/2009   Past Medical History:  Diagnosis Date  . Anxiety   . Arthritis    "lower back; left ankle" (03/20/2014)  . ASTHMA   . Blood transfusion    "related to OHS, ulcers"  . CAD (coronary artery disease)    a. s/p 2V CABG 1996. b. Last cath 12/2016 -> patent grafts, med rx.  . CAD, ARTERY BYPASS GRAFT   . Cataract   . CKD (chronic kidney disease), stage II   . Colon polyps    adenomatous polyps  . COPD   . Depression   . Diabetes mellitus without complication ([place])   . Diverticulosis   . Duodenal ulcer without hemorrhage or perforation 12/29/2011  .  GASTROESOPHAGEAL REFLUX DISEASE   . HYPERLIPIDEMIA   . Insomnia   . Leukodystrophy ([place])   . Memory loss   . Nephrolithiasis    "I've had over 320 kidney stones" (03/20/2014)  . NEPHROLITHIASIS, HX OF 03/12/2009  . Parkinsonism ([place])   . Sleep apnea    "don't always wear it" (03/20/2014)  . Stroke [facility])    TIA  . SUPRAVENTRICULAR TACHYCARDIA, HX OF   . SVT (supraventricular tachycardia) ([place]) 03/12/2009  . TIA   . Ulcer     Family History  Problem Relation Age of Onset  . Alcohol abuse Father   . Stroke Mother   . Heart disease Brother   . Diabetes Brother   . Heart disease Paternal Grandmother   . Colon cancer Neg Hx   . Stomach cancer Neg Hx   . Esophageal cancer Neg Hx   . Rectal cancer Neg Hx     Past Surgical History:  Procedure Laterality Date  . ANKLE FRACTURE SURGERY Left ~ 2012  . CARDIAC CATHETERIZATION    . COLONOSCOPY    . CORONARY ARTERY BYPASS GRAFT  1996   CABG X2  . CYSTOSCOPY WITH URETEROSCOPY, STONE BASKETRY AND STENT PLACEMENT    . Cystourethroscopy with stent removal.    . EXCISIONAL HEMORRHOIDECTOMY    . GASTRECTOMY    . HIATAL HERNIA REPAIR    . IRRIGATION AND DEBRIDEMENT SEBACEOUS CYST     "off my back  . LEFT HEART CATH AND CORS/GRAFTS ANGIOGRAPHY N/A 12/23/2016   Procedure: Left Heart Cath and Cors/Grafts Angiography;  Surgeon: Burnell Blanks, MD;  Location: [place] [facility];  Service: Cardiovascular;  Laterality: N/A;  . POLYPECTOMY    . PROSTATE SURGERY     "took the center of my prostate out"  . UPPER GASTROINTESTINAL ENDOSCOPY    . VAGOTOMY     Social History   Occupational History  . 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 Smoking status: Former Smoker    Packs/day: 0.50    Types: Cigarettes    Quit date: 10/25/1962    Years since quitting: 56.8  . Smokeless tobacco: Former Systems developer    Types: [place] date: 04/15/2018  Substance and Sexual Activity  . Alcohol use: No     Alcohol/week: 0.0 standard drinks  . Drug use: No  . Sexual activity: Yes

## 2019-08-10 ENCOUNTER — Other Ambulatory Visit: Payer: Self-pay | Admitting: Internal Medicine

## 2019-08-13 ENCOUNTER — Telehealth: Payer: Self-pay | Admitting: Orthopaedic Surgery

## 2019-08-13 NOTE — Telephone Encounter (Signed)
Please send in Rx into pharm.

## 2019-08-13 NOTE — Telephone Encounter (Signed)
Patient called stating that he is still having pain and wanted to know if Dr. Erlinda Hong would call him in something for pain that does not have aspirin in it.  He would need the RX to be called in to [facility] on [address]  [phone].  Thank you.

## 2019-08-14 ENCOUNTER — Other Ambulatory Visit: Payer: Self-pay

## 2019-08-14 ENCOUNTER — Other Ambulatory Visit: Payer: Self-pay | Admitting: Internal Medicine

## 2019-08-14 MED ORDER — TRAMADOL HCL 50 MG PO TABS: ORAL_TABLET | ORAL | 0 refills | Status: DC

## 2019-08-14 NOTE — Telephone Encounter (Signed)
Called into pharm  

## 2019-08-14 NOTE — Telephone Encounter (Signed)
Ok to call in tramadol 50 mg 1-2 tid prn pain #30

## 2019-08-20 ENCOUNTER — Encounter (HOSPITAL_BASED_OUTPATIENT_CLINIC_OR_DEPARTMENT_OTHER): Payer: Self-pay | Admitting: *Deleted

## 2019-08-20 ENCOUNTER — Other Ambulatory Visit: Payer: Self-pay

## 2019-08-21 ENCOUNTER — Other Ambulatory Visit (HOSPITAL_COMMUNITY)
Admission: RE | Admit: 2019-08-21 | Discharge: 2019-08-21 | Disposition: A | Discharge status: Home / Self Care | Payer: PPO | Source: Ambulatory Visit | Attending: Orthopaedic Surgery | Admitting: Orthopaedic Surgery

## 2019-08-21 DIAGNOSIS — Z01812 Encounter for preprocedural laboratory examination: Secondary | ICD-10-CM | POA: Diagnosis not present

## 2019-08-21 DIAGNOSIS — Z20828 Contact with and (suspected) exposure to other viral communicable diseases: Secondary | ICD-10-CM | POA: Insufficient documentation

## 2019-08-22 LAB — NOVEL CORONAVIRUS, NAA (HOSP ORDER, SEND-OUT TO REF LAB; TAT 18-24 HRS): SARS-CoV-2, NAA: NOT DETECTED

## 2019-08-24 ENCOUNTER — Other Ambulatory Visit: Payer: Self-pay

## 2019-08-24 ENCOUNTER — Encounter (HOSPITAL_BASED_OUTPATIENT_CLINIC_OR_DEPARTMENT_OTHER)
Admission: RE | Disposition: A | Discharge status: Home / Self Care | Payer: Self-pay | Source: Home / Self Care | Attending: Orthopaedic Surgery

## 2019-08-24 ENCOUNTER — Ambulatory Visit (HOSPITAL_BASED_OUTPATIENT_CLINIC_OR_DEPARTMENT_OTHER): Payer: PPO | Admitting: Certified Registered"

## 2019-08-24 ENCOUNTER — Ambulatory Visit (HOSPITAL_BASED_OUTPATIENT_CLINIC_OR_DEPARTMENT_OTHER)
Admission: RE | Admit: 2019-08-24 | Discharge: 2019-08-24 | Disposition: A | Discharge status: Home / Self Care | Payer: PPO | Attending: Orthopaedic Surgery | Admitting: Orthopaedic Surgery

## 2019-08-24 ENCOUNTER — Encounter (HOSPITAL_BASED_OUTPATIENT_CLINIC_OR_DEPARTMENT_OTHER): Payer: Self-pay | Admitting: *Deleted

## 2019-08-24 DIAGNOSIS — Z79899 Other long term (current) drug therapy: Secondary | ICD-10-CM | POA: Insufficient documentation

## 2019-08-24 DIAGNOSIS — K5790 Diverticulosis of intestine, part unspecified, without perforation or abscess without bleeding: Secondary | ICD-10-CM | POA: Diagnosis not present

## 2019-08-24 DIAGNOSIS — Z87891 Personal history of nicotine dependence: Secondary | ICD-10-CM | POA: Diagnosis not present

## 2019-08-24 DIAGNOSIS — Z87442 Personal history of urinary calculi: Secondary | ICD-10-CM | POA: Insufficient documentation

## 2019-08-24 DIAGNOSIS — Z8249 Family history of ischemic heart disease and other diseases of the circulatory system: Secondary | ICD-10-CM | POA: Diagnosis not present

## 2019-08-24 DIAGNOSIS — G4730 Sleep apnea, unspecified: Secondary | ICD-10-CM | POA: Diagnosis not present

## 2019-08-24 DIAGNOSIS — M1990 Unspecified osteoarthritis, unspecified site: Secondary | ICD-10-CM | POA: Diagnosis not present

## 2019-08-24 DIAGNOSIS — F419 Anxiety disorder, unspecified: Secondary | ICD-10-CM | POA: Insufficient documentation

## 2019-08-24 DIAGNOSIS — I2510 Atherosclerotic heart disease of native coronary artery without angina pectoris: Secondary | ICD-10-CM | POA: Diagnosis not present

## 2019-08-24 DIAGNOSIS — F329 Major depressive disorder, single episode, unspecified: Secondary | ICD-10-CM | POA: Diagnosis not present

## 2019-08-24 DIAGNOSIS — Z7982 Long term (current) use of aspirin: Secondary | ICD-10-CM | POA: Diagnosis not present

## 2019-08-24 DIAGNOSIS — E1122 Type 2 diabetes mellitus with diabetic chronic kidney disease: Secondary | ICD-10-CM | POA: Diagnosis not present

## 2019-08-24 DIAGNOSIS — K219 Gastro-esophageal reflux disease without esophagitis: Secondary | ICD-10-CM | POA: Diagnosis not present

## 2019-08-24 DIAGNOSIS — E785 Hyperlipidemia, unspecified: Secondary | ICD-10-CM | POA: Insufficient documentation

## 2019-08-24 DIAGNOSIS — N1831 Chronic kidney disease, stage 3a: Secondary | ICD-10-CM | POA: Diagnosis not present

## 2019-08-24 DIAGNOSIS — E1136 Type 2 diabetes mellitus with diabetic cataract: Secondary | ICD-10-CM | POA: Diagnosis not present

## 2019-08-24 DIAGNOSIS — X58XXXA Exposure to other specified factors, initial encounter: Secondary | ICD-10-CM | POA: Diagnosis not present

## 2019-08-24 DIAGNOSIS — Z8673 Personal history of transient ischemic attack (TIA), and cerebral infarction without residual deficits: Secondary | ICD-10-CM | POA: Insufficient documentation

## 2019-08-24 DIAGNOSIS — I129 Hypertensive chronic kidney disease with stage 1 through stage 4 chronic kidney disease, or unspecified chronic kidney disease: Secondary | ICD-10-CM | POA: Diagnosis not present

## 2019-08-24 DIAGNOSIS — G20 Parkinson's disease: Secondary | ICD-10-CM | POA: Diagnosis not present

## 2019-08-24 DIAGNOSIS — S83242A Other tear of medial meniscus, current injury, left knee, initial encounter: Secondary | ICD-10-CM

## 2019-08-24 DIAGNOSIS — Z7902 Long term (current) use of antithrombotics/antiplatelets: Secondary | ICD-10-CM | POA: Diagnosis not present

## 2019-08-24 DIAGNOSIS — Z951 Presence of aortocoronary bypass graft: Secondary | ICD-10-CM | POA: Diagnosis not present

## 2019-08-24 DIAGNOSIS — N182 Chronic kidney disease, stage 2 (mild): Secondary | ICD-10-CM | POA: Diagnosis not present

## 2019-08-24 DIAGNOSIS — J449 Chronic obstructive pulmonary disease, unspecified: Secondary | ICD-10-CM | POA: Insufficient documentation

## 2019-08-24 HISTORY — PX: KNEE ARTHROSCOPY WITH MEDIAL MENISECTOMY: SHX5651

## 2019-08-24 LAB — GLUCOSE, CAPILLARY
Glucose-Capillary: 84 mg/dL (ref 70–99)
Glucose-Capillary: 107 mg/dL — ABNORMAL HIGH (ref 70–99)

## 2019-08-24 SURGERY — ARTHROSCOPY, KNEE, WITH MEDIAL MENISCECTOMY
Anesthesia: General | Site: Knee | Laterality: Left

## 2019-08-24 MED ORDER — LACTATED RINGERS IV SOLN
INTRAVENOUS | Status: DC
  Administered 2019-08-24: 11:00:00 via INTRAVENOUS

## 2019-08-24 MED ORDER — PHENYLEPHRINE HCL (PRESSORS) 10 MG/ML IV SOLN
INTRAVENOUS | Status: DC | PRN
  Administered 2019-08-24: 120 ug via INTRAVENOUS
  Administered 2019-08-24: 80 ug via INTRAVENOUS
  Administered 2019-08-24: 120 ug via INTRAVENOUS

## 2019-08-24 MED ORDER — DEXAMETHASONE SODIUM PHOSPHATE 10 MG/ML IJ SOLN
INTRAMUSCULAR | Status: DC | PRN
  Administered 2019-08-24: 10 mg via INTRAVENOUS

## 2019-08-24 MED ORDER — MEPERIDINE HCL 25 MG/ML IJ SOLN: 6.2500 mg | INTRAMUSCULAR | Status: DC | PRN

## 2019-08-24 MED ORDER — FENTANYL CITRATE (PF) 100 MCG/2ML IJ SOLN
INTRAMUSCULAR | Status: AC
  Filled 2019-08-24: qty 2

## 2019-08-24 MED ORDER — OXYCODONE HCL 5 MG PO TABS
5.0000 mg | ORAL_TABLET | Freq: Once | ORAL | Status: AC
  Administered 2019-08-24: 5 mg via ORAL

## 2019-08-24 MED ORDER — ONDANSETRON HCL 4 MG PO TABS: 4.0000 mg | ORAL_TABLET | Freq: Three times a day (TID) | ORAL | 0 refills | Status: DC | PRN

## 2019-08-24 MED ORDER — BUPIVACAINE HCL (PF) 0.25 % IJ SOLN
INTRAMUSCULAR | Status: DC | PRN
  Administered 2019-08-24: 20 mL

## 2019-08-24 MED ORDER — LIDOCAINE 2% (20 MG/ML) 5 ML SYRINGE
INTRAMUSCULAR | Status: AC
  Filled 2019-08-24: qty 5

## 2019-08-24 MED ORDER — CHLORHEXIDINE GLUCONATE 4 % EX LIQD: 60.0000 mL | Freq: Once | CUTANEOUS | Status: DC

## 2019-08-24 MED ORDER — MIDAZOLAM HCL 2 MG/2ML IJ SOLN: 1.0000 mg | INTRAMUSCULAR | Status: DC | PRN

## 2019-08-24 MED ORDER — HYDROCODONE-ACETAMINOPHEN 5-325 MG PO TABS: 1.0000 | ORAL_TABLET | Freq: Three times a day (TID) | ORAL | 0 refills | Status: DC | PRN

## 2019-08-24 MED ORDER — ONDANSETRON HCL 4 MG/2ML IJ SOLN
INTRAMUSCULAR | Status: AC
  Filled 2019-08-24: qty 2

## 2019-08-24 MED ORDER — OXYCODONE HCL 5 MG PO TABS
ORAL_TABLET | ORAL | Status: AC
  Filled 2019-08-24: qty 1

## 2019-08-24 MED ORDER — LIDOCAINE HCL (CARDIAC) PF 100 MG/5ML IV SOSY
PREFILLED_SYRINGE | INTRAVENOUS | Status: DC | PRN
  Administered 2019-08-24: 100 mg via INTRAVENOUS

## 2019-08-24 MED ORDER — METOCLOPRAMIDE HCL 5 MG/ML IJ SOLN: 10.0000 mg | Freq: Once | INTRAMUSCULAR | Status: DC | PRN

## 2019-08-24 MED ORDER — ONDANSETRON HCL 4 MG/2ML IJ SOLN
INTRAMUSCULAR | Status: DC | PRN
  Administered 2019-08-24: 4 mg via INTRAVENOUS

## 2019-08-24 MED ORDER — DEXMEDETOMIDINE HCL IN NACL 200 MCG/50ML IV SOLN
INTRAVENOUS | Status: AC
  Filled 2019-08-24: qty 50

## 2019-08-24 MED ORDER — CEFAZOLIN SODIUM-DEXTROSE 2-4 GM/100ML-% IV SOLN
INTRAVENOUS | Status: AC
  Filled 2019-08-24: qty 100

## 2019-08-24 MED ORDER — FENTANYL CITRATE (PF) 100 MCG/2ML IJ SOLN
50.0000 ug | INTRAMUSCULAR | Status: AC | PRN
  Administered 2019-08-24 (×3): 25 ug via INTRAVENOUS

## 2019-08-24 MED ORDER — ACETAMINOPHEN 10 MG/ML IV SOLN
1000.0000 mg | Freq: Once | INTRAVENOUS | Status: AC
  Administered 2019-08-24: 1000 mg via INTRAVENOUS

## 2019-08-24 MED ORDER — FENTANYL CITRATE (PF) 100 MCG/2ML IJ SOLN
25.0000 ug | INTRAMUSCULAR | Status: DC | PRN
  Administered 2019-08-24 (×4): 25 ug via INTRAVENOUS

## 2019-08-24 MED ORDER — CEFAZOLIN SODIUM-DEXTROSE 2-4 GM/100ML-% IV SOLN: 2.0000 g | INTRAVENOUS | Status: DC

## 2019-08-24 MED ORDER — ACETAMINOPHEN 10 MG/ML IV SOLN
INTRAVENOUS | Status: AC
  Filled 2019-08-24: qty 100

## 2019-08-24 MED ORDER — SODIUM CHLORIDE 0.9 % IR SOLN
Status: DC | PRN
  Administered 2019-08-24: 15000 mL

## 2019-08-24 MED ORDER — PROPOFOL 10 MG/ML IV BOLUS
INTRAVENOUS | Status: DC | PRN
  Administered 2019-08-24: 150 mg via INTRAVENOUS

## 2019-08-24 MED ORDER — CEFAZOLIN SODIUM-DEXTROSE 2-3 GM-%(50ML) IV SOLR
INTRAVENOUS | Status: DC | PRN
  Administered 2019-08-24: 2 g via INTRAVENOUS

## 2019-08-24 MED ORDER — MIDAZOLAM HCL 2 MG/2ML IJ SOLN
INTRAMUSCULAR | Status: AC
  Filled 2019-08-24: qty 2

## 2019-08-24 MED ORDER — LACTATED RINGERS IV SOLN
INTRAVENOUS | Status: DC
  Administered 2019-08-24: 12:00:00 via INTRAVENOUS

## 2019-08-24 MED ORDER — PROPOFOL 10 MG/ML IV BOLUS
INTRAVENOUS | Status: AC
  Filled 2019-08-24: qty 40

## 2019-08-24 SURGICAL SUPPLY — 46 items
BANDAGE ESMARK 6X9 LF (GAUZE/BANDAGES/DRESSINGS) IMPLANT
BLADE CUDA GRT WHITE 3.5 (BLADE) IMPLANT
BLADE EXCALIBUR 4.0MM X 13CM (MISCELLANEOUS) ×1
BLADE EXCALIBUR 4.0X13 (MISCELLANEOUS) ×1 IMPLANT
BLADE GREAT WHITE 4.2 (BLADE) IMPLANT
BLADE GREAT WHITE 4.2MM (BLADE)
BLADE LANZA CVD 15 DEG (BLADE) IMPLANT
BLADE SHAVER TORPEDO 4X13 (MISCELLANEOUS) ×2 IMPLANT
BNDG ELASTIC 6X5.8 VLCR STR LF (GAUZE/BANDAGES/DRESSINGS) ×6 IMPLANT
BNDG ESMARK 6X9 LF (GAUZE/BANDAGES/DRESSINGS)
COVER WAND RF STERILE (DRAPES) IMPLANT
CUFF TOURN SGL QUICK 34 (TOURNIQUET CUFF) ×2
CUFF TRNQT CYL 34X4.125X (TOURNIQUET CUFF) ×1 IMPLANT
DISSECTOR  3.8MM X 13CM (MISCELLANEOUS) ×2
DISSECTOR 3.8MM X 13CM (MISCELLANEOUS) IMPLANT
DRAPE ARTHROSCOPY W/POUCH 90 (DRAPES) ×3 IMPLANT
DRAPE IMP U-DRAPE 54X76 (DRAPES) ×3 IMPLANT
DRAPE U-SHAPE 47X51 STRL (DRAPES) ×3 IMPLANT
DURAPREP 26ML APPLICATOR (WOUND CARE) ×3 IMPLANT
EXCALIBUR 3.8MM X 13CM (MISCELLANEOUS) IMPLANT
GAUZE SPONGE 4X4 12PLY STRL (GAUZE/BANDAGES/DRESSINGS) ×3 IMPLANT
GAUZE XEROFORM 1X8 LF (GAUZE/BANDAGES/DRESSINGS) ×3 IMPLANT
GLOVE BIO SURGEON STRL SZ 6.5 (GLOVE) ×2 IMPLANT
GLOVE BIO SURGEONS STRL SZ 6.5 (GLOVE) ×2
GLOVE BIOGEL PI IND STRL 7.0 (GLOVE) ×1 IMPLANT
GLOVE BIOGEL PI INDICATOR 7.0 (GLOVE) ×4
GLOVE ECLIPSE 7.0 STRL STRAW (GLOVE) ×5 IMPLANT
GLOVE SKINSENSE NS SZ7.5 (GLOVE) ×2
GLOVE SKINSENSE STRL SZ7.5 (GLOVE) ×1 IMPLANT
GLOVE SURG SYN 7.5  E (GLOVE) ×2
GLOVE SURG SYN 7.5 E (GLOVE) ×1 IMPLANT
GLOVE SURG SYN 7.5 PF PI (GLOVE) ×1 IMPLANT
GOWN STRL REIN XL XLG (GOWN DISPOSABLE) ×3 IMPLANT
GOWN STRL REUS W/ TWL LRG LVL3 (GOWN DISPOSABLE) ×1 IMPLANT
GOWN STRL REUS W/ TWL XL LVL3 (GOWN DISPOSABLE) ×1 IMPLANT
GOWN STRL REUS W/TWL LRG LVL3 (GOWN DISPOSABLE) ×2
GOWN STRL REUS W/TWL XL LVL3 (GOWN DISPOSABLE) ×4
KNEE WRAP E Z 3 GEL PACK (MISCELLANEOUS) ×3 IMPLANT
MANIFOLD NEPTUNE II (INSTRUMENTS) ×3 IMPLANT
PACK ARTHROSCOPY DSU (CUSTOM PROCEDURE TRAY) ×3 IMPLANT
PACK BASIN DAY SURGERY FS (CUSTOM PROCEDURE TRAY) ×3 IMPLANT
SHAVER 4.2 MM LANZA 9391A (BLADE) ×3 IMPLANT
SUT ETHILON 3 0 PS 1 (SUTURE) ×3 IMPLANT
TOWEL GREEN STERILE FF (TOWEL DISPOSABLE) ×3 IMPLANT
TUBING ARTHRO INFLOW-ONLY STRL (TUBING) ×1 IMPLANT
TUBING ARTHROSCOPY IRRIG 16FT (MISCELLANEOUS) ×2 IMPLANT

## 2019-08-24 NOTE — Op Note (Signed)
   Surgery Date: 08/24/2019  Surgeon(s): Leandrew Koyanagi, MD  ASSIST: Madalyn Rob, [place]; necessary for the timely completion of procedure and due to complexity of procedure.  ANESTHESIA:  general  FLUIDS: Per anesthesia record.   ESTIMATED BLOOD LOSS: minimal  PREOPERATIVE DIAGNOSES:  1. Left knee medial meniscus tear 2. Left knee synovitis  POSTOPERATIVE DIAGNOSES:  same  PROCEDURES PERFORMED:  1. Left knee arthroscopy with major synovectomy 2. Left knee arthroscopy with arthroscopic partial medial meniscectomy 3. Left knee arthroscopy with arthroscopic chondroplasty medial femoral condyle and tibial plateau.   DESCRIPTION OF PROCEDURE: Mr. Kanhai is a 84 y.o.-year-old male with left knee medial meniscus tear. Plans are to proceed with partial medial meniscectomy and diagnostic arthroscopy with debridement as indicated. Full discussion held regarding risks benefits alternatives and complications related surgical intervention. Conservative care options reviewed. All questions answered.  The patient was identified in the preoperative holding area and the operative extremity was marked. The patient was brought to the operating room and transferred to operating table in a supine position. Satisfactory general anesthesia was induced by anesthesiology.    Standard anterolateral, anteromedial arthroscopy portals were obtained. The anteromedial portal was obtained with a spinal needle for localization under direct visualization with subsequent diagnostic findings.   Incisions were made for knee arthroscopy portals.  Major synovectomy was first performed in all 3 compartments of the knee.  Diagnostic arthroscopy revealed a complex tear of the mid body and posterior horn of the medial meniscus.  When probed the meniscus was grossly unstable.  Partial medial meniscectomy was performed using combination of meniscus basket and oscillating shaver back to stable border.  Gentle  chondroplasty was performed in the medial compartment.  Lateral compartment was relatively unremarkable with some mild chondromalacia.  Patellofemoral compartment exhibited mild chondromalacia of the patella.  Excess fluid was drained from the knee.  Incisions were closed with interrupted nylon sutures.  Sterile dressings were applied.  Patient tolerated procedure well had no immediate complications.  Suprapatellar pouch and gutters: moderate synovitis or debris. Patella chondral surface: Grade 2 Trochlear chondral surface: Grade 1 Patellofemoral tracking: Normal Medial meniscus: Complex tear.  Medial femoral condyle weight bearing surface: Grade 3 Medial tibial plateau: Grade 3 Anterior cruciate ligament:stable Posterior cruciate ligament:stable Lateral meniscus: Unremarkable.   Lateral femoral condyle weight bearing surface: Grade 1 Lateral tibial plateau: Grade 1  DISPOSITION: The patient was awakened from general anesthetic, extubated, taken to the recovery room in medically stable condition, no apparent complications. The patient may be weightbearing as tolerated to the operative lower extremity.  Range of motion of right knee as tolerated.  Azucena Cecil, MD [facility] 12:50 PM

## 2019-08-24 NOTE — Transfer of Care (Signed)
Immediate Anesthesia Transfer of Care Note  Patient: John [name]  Procedure(s) Performed: LEFT KNEE ARTHROSCOPY WITH PARTIAL MEDIAL MENISCECTOMY (Left Knee)  Patient Location: PACU  Anesthesia Type:General  Level of Consciousness: awake, alert  and oriented  Airway & Oxygen Therapy: Patient Spontanous Breathing and Patient connected to face mask oxygen  Post-op Assessment: Report given to RN and Post -op Vital signs reviewed and stable  Post vital signs: Reviewed and stable  Last Vitals:  Vitals Value Taken Time  BP    Temp    Pulse    Resp    SpO2      Last Pain:  Vitals:   08/24/19 1051  TempSrc: Oral  PainSc: 5       Patients Stated Pain Goal: 5 ([id number])  Complications: No apparent anesthesia complications

## 2019-08-24 NOTE — Anesthesia Preprocedure Evaluation (Addendum)
Anesthesia Evaluation  Patient identified by MRN, date of birth, ID band Patient awake    Reviewed: Allergy & Precautions, NPO status , Patient's Chart, lab work & pertinent test results  Airway Mallampati: II  TM Distance: >3 FB Neck ROM: Full    Dental no notable dental hx.    Pulmonary asthma , sleep apnea , COPD, former smoker,    Pulmonary exam normal breath sounds clear to auscultation       Cardiovascular hypertension, + CAD  Normal cardiovascular exam+ dysrhythmias Supra Ventricular Tachycardia  Rhythm:Regular Rate:Normal     Neuro/Psych Anxiety Depression TIACVA negative psych ROS   GI/Hepatic Neg liver ROS, GERD  Controlled,  Endo/Other  diabetes, Type 2  Renal/GU Renal InsufficiencyRenal disease  negative genitourinary   Musculoskeletal negative musculoskeletal ROS (+)   Abdominal   Peds negative pediatric ROS (+)  Hematology negative hematology ROS (+)   Anesthesia Other Findings   Reproductive/Obstetrics negative OB ROS                            Anesthesia Physical Anesthesia Plan  ASA: III  Anesthesia Plan: General   Post-op Pain Management:    Induction: Intravenous  PONV Risk Score and Plan: 2 and Ondansetron and Treatment may vary due to age or medical condition  Airway Management Planned: LMA  Additional Equipment:   Intra-op Plan:   Post-operative Plan: Extubation in OR  Informed Consent: I have reviewed the patients History and Physical, chart, labs and discussed the procedure including the risks, benefits and alternatives for the proposed anesthesia with the patient or authorized representative who has indicated his/her understanding and acceptance.     Dental advisory given  Plan Discussed with: CRNA  Anesthesia Plan Comments:         Anesthesia Quick Evaluation

## 2019-08-24 NOTE — Anesthesia Procedure Notes (Signed)
Procedure Name: LMA Insertion Performed by: Verita Lamb, CRNA Pre-anesthesia Checklist: Patient identified, Emergency Drugs available, Suction available and Patient being monitored Patient Re-evaluated:Patient Re-evaluated prior to induction Oxygen Delivery Method: Circle system utilized Preoxygenation: Pre-oxygenation with 100% oxygen Induction Type: IV induction Ventilation: Mask ventilation without difficulty LMA: LMA inserted LMA Size: 5.0 Tube type: Oral Number of attempts: 1 Placement Confirmation: positive ETCO2 Tube secured with: Tape Dental Injury: Teeth and Oropharynx as per pre-operative assessment

## 2019-08-24 NOTE — H&P (Signed)
PREOPERATIVE H&P  Chief Complaint: left knee medial meniscal tear  HPI: John [name] is a 84 y.o. male who presents for surgical treatment of left knee medial meniscal tear.  He denies any changes in medical history.  Past Medical History:  Diagnosis Date  . Anxiety   . Arthritis    "lower back; left ankle" (03/20/2014)  . ASTHMA   . Blood transfusion    "related to OHS, ulcers"  . CAD (coronary artery disease)    a. s/p 2V CABG 1996. b. Last cath 12/2016 -> patent grafts, med rx.  . CAD, ARTERY BYPASS GRAFT   . Cataract   . CKD (chronic kidney disease), stage II   . Colon polyps    adenomatous polyps  . COPD   . Depression   . Diabetes mellitus without complication (HCC)    no meds now  . Diverticulosis   . Duodenal ulcer without hemorrhage or perforation 12/29/2011  . GASTROESOPHAGEAL REFLUX DISEASE   . HYPERLIPIDEMIA   . Insomnia   . 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 Memory loss   . Nephrolithiasis    "I've had over 320 kidney stones" (03/20/2014)  . NEPHROLITHIASIS, HX OF 03/12/2009  . Parkinsonism ([place])   . Sleep apnea    does not use CPAP  . Stroke [facility])    TIA  . SUPRAVENTRICULAR TACHYCARDIA, HX OF   . SVT (supraventricular tachycardia) ([place]) 03/12/2009  . TIA   . Ulcer    Past Surgical History:  Procedure Laterality Date  . ANKLE FRACTURE SURGERY Left ~ 2012  . CARDIAC CATHETERIZATION    . COLONOSCOPY    . CORONARY ARTERY BYPASS GRAFT  1996   CABG X2  . CYSTOSCOPY WITH URETEROSCOPY, STONE BASKETRY AND STENT PLACEMENT    . Cystourethroscopy with stent removal.    . EXCISIONAL HEMORRHOIDECTOMY    . GASTRECTOMY    . HIATAL HERNIA REPAIR    . IRRIGATION AND DEBRIDEMENT SEBACEOUS CYST     "off my back  . LEFT HEART CATH AND CORS/GRAFTS ANGIOGRAPHY N/A 12/23/2016   Procedure: Left Heart Cath and Cors/Grafts Angiography;  Surgeon: Burnell Blanks, MD;  Location: [facility];  Service: Cardiovascular;  Laterality: N/A;  . POLYPECTOMY    . PROSTATE  SURGERY     "took the center of my prostate out"  . UPPER GASTROINTESTINAL ENDOSCOPY    . VAGOTOMY     Social History   Socioeconomic History  . Marital status: Widowed    Spouse name: Not on file  . Number of children: 2  . Years of education: 41  . Highest education level: Not on file  Occupational History  . Occupation: Programme researcher, broadcasting/film/video at [employer]: [place]  . Financial resource strain: Not on file  . Food insecurity    Worry: Not on file    Inability: Not on file  . Transportation needs    Medical: Not on file    Non-medical: Not on file  Tobacco Use  . Smoking status: Former Smoker    Packs/day: 0.50    Types: Cigarettes    Quit date: 10/25/1962    Years since quitting: 56.8  . Smokeless tobacco: Former Systems developer    Types: [place] date: 04/15/2018  Substance and Sexual Activity  . Alcohol use: No    Alcohol/week: 0.0 standard drinks  . Drug use: No  . Sexual activity: Yes  Lifestyle  . Physical activity  Days per week: Not on file    Minutes per session: Not on file  . Stress: Not on file  Relationships  . Social Herbalist on phone: Not on file    Gets together: Not on file    Attends religious service: Not on file    Active member of club or organization: Not on file    Attends meetings of clubs or organizations: Not on file    Relationship status: Not on file  Other Topics Concern  . Not on file  Social History Narrative   Denies caffeine use    Family History  Problem Relation Age of Onset  . Alcohol abuse Father   . Stroke Mother   . Heart disease Brother   . Diabetes Brother   . Heart disease Paternal Grandmother   . Colon cancer Neg Hx   . Stomach cancer Neg Hx   . Esophageal cancer Neg Hx   . Rectal cancer Neg Hx    Allergies  Allergen Reactions  . Gabapentin Other (See Comments)    Severe confusion  . Prozac [Fluoxetine Hcl] Other (See Comments)    Patient state that it does not agree  with him  . Soma [Carisoprodol] Other (See Comments)    Patient stated that it does not agree with him  . Beta Adrenergic Blockers Other (See Comments)    REACTION: Bradycardia   Prior to Admission medications   Medication Sig Start Date End Date Taking? Authorizing Provider  ALPRAZolam Duanne Moron) 1 MG tablet Take 1/2 to 1 tablet at Bedtime ONLY if needed for Sleep and please try to limit to 5 days /week to avoid addiction 07/04/19  Yes Unk Pinto, MD  aspirin 81 MG tablet Take 81 mg by mouth daily.     Yes [provider]  Cholecalciferol (VITAMIN D3) 2000 UNITS capsule Take 8,000 Units by mouth daily.    Yes [provider]  clopidogrel (PLAVIX) 75 MG tablet TAKE 1 TABLET BY MOUTH ONCE DAILY 06/05/18  Yes Unk Pinto, MD  escitalopram (LEXAPRO) 10 MG tablet Take 1 tablet daily for anxiety 02/02/19  Yes Unk Pinto, MD  finasteride (PROSCAR) 5 MG tablet Take 1 tablet daily for prostate 05/11/19  Yes Unk Pinto, MD  fludrocortisone (FLORINEF) 0.1 MG tablet Take 1 tablet 2 x /day for Low BP 08/14/19  Yes Unk Pinto, MD  Melatonin 5 MG CAPS Take 10 mg by mouth at bedtime.    Yes [provider]  pantoprazole (PROTONIX) 40 MG tablet TAKE ONE TABLET BY MOUTH ONCE DAILY 07/29/17  Yes Vicie Mutters, PA-C  potassium citrate (UROCIT-K) 10 MEQ (1080 MG) SR tablet TAKE ONE TABLET BY MOUTH TWICE DAILY 01/10/18  Yes Unk Pinto, MD  prochlorperazine (COMPAZINE) 5 MG tablet Take 1 tablet 3 x /day if needed for nausea 02/02/19  Yes Unk Pinto, MD  QUEtiapine (SEROQUEL) 25 MG tablet Take 1 tablet 3 x /day 05/03/19  Yes Unk Pinto, MD  silodosin (RAPAFLO) 8 MG CAPS capsule Take 1 capsule at Bedtime for Prostate 05/11/19  Yes Unk Pinto, MD  simvastatin (ZOCOR) 80 MG tablet TAKE ONE TABLET BY MOUTH AT BEDTIME 03/21/18  Yes Unk Pinto, MD  traMADol Veatrice Bourbon) 50 MG tablet 1-2 tid prn pain 08/14/19  Yes Dwana Melena L, PA-C  glucose blood  (FREESTYLE LITE) test strip TEST BLOOD SUGAR ONCE DAILY 06/01/19   Unk Pinto, MD  Lancets (FREESTYLE) lancets 1 each by Other route as needed for other. Use as  instructed    [provider]  methylPREDNISolone (MEDROL DOSEPAK) 4 MG TBPK tablet Take as directed 07/20/19   Aundra Dubin, PA-C  nitroGLYCERIN (NITROSTAT) 0.4 MG SL tablet Sig: 1 tablet under tongue every 3 to 5 minutes as needed for Angina - Please Dispense # 2 bottles of # 25 tabs 07/17/18   Unk Pinto, MD     Positive ROS: All other systems have been reviewed and were otherwise negative with the exception of those mentioned in the HPI and as above.  Physical Exam: General: Alert, no acute distress Cardiovascular: No pedal edema Respiratory: No cyanosis, no use of accessory musculature GI: abdomen soft Skin: No lesions in the area of chief complaint Neurologic: Sensation intact distally Psychiatric: Patient is competent for consent with normal mood and affect Lymphatic: no lymphedema  MUSCULOSKELETAL: exam stable  Assessment: left knee medial meniscal tear  Plan: Plan for Procedure(s): LEFT KNEE ARTHROSCOPY WITH PARTIAL MEDIAL MENISCECTOMY  The risks benefits and alternatives were discussed with the patient including but not limited to the risks of nonoperative treatment, versus surgical intervention including infection, bleeding, nerve injury,  blood clots, cardiopulmonary complications, morbidity, mortality, among others, and they were willing to proceed.    Eduard Roux, MD   08/24/2019 10:14 AM

## 2019-08-24 NOTE — Discharge Instructions (Signed)
° ° °Post-operative patient instructions  °Knee Arthroscopy  ° °• Ice:  Place intermittent ice or cooler pack over your knee, 30 minutes on and 30 minutes off.  Continue this for the first 72 hours after surgery, then save ice for use after therapy sessions or on more active days.   °• Weight:  You may bear weight on your leg as your symptoms allow. °• Crutches:  Use crutches (or walker) to assist in walking until told to discontinue by your physical therapist or physician. This will help to reduce pain. °• Strengthening:  Perform simple thigh squeezes (isometric quad contractions) and straight leg lifts as you are able (3 sets of 5 to 10 repetitions, 3 times a day).  For the leg lifts, have someone support under your ankle in the beginning until you have increased strength enough to do this on your own.  To help get started on thigh squeezes, place a pillow under your knee and push down on the pillow with back of knee (sometimes easier to do than with your leg fully straight). °• Motion:  Perform gentle knee motion as tolerated - this is gentle bending and straightening of the knee. Seated heel slides: you can start by sitting in a chair, remove your brace, and gently slide your heel back on the floor - allowing your knee to bend. Have someone help you straighten your knee (or use your other leg/foot hooked under your ankle.  °• Dressing:  Perform 1st dressing change at 2 days postoperative. A moderate amount of blood tinged drainage is to be expected.  So if you bleed through the dressing on the first or second day or if you have fevers, it is fine to change the dressing/check the wounds early and redress wound. Elevate your leg.  If it bleeds through again, or if the incisions are leaking frank blood, please call the office. May change dressing every 1-2 days thereafter to help watch wounds. Can purchase Tegaderm (or 3M Nexcare) water resistant dressings at local pharmacy / [facility]. °• Shower:  Light shower is  ok after 2 days.  Please take shower, NO bath. Recover with gauze and ace wrap to help keep wounds protected.   °• Pain medication:  A narcotic pain medication has been prescribed.  Take as directed.  Typically you need narcotic pain medication more regularly during the first 3 to 5 days after surgery.  Decrease your use of the medication as the pain improves.  Narcotics can sometimes cause constipation, even after a few doses.  If you have problems with constipation, you can take an over the counter stool softener or light laxative.  If you have persistent problems, please notify your physician’s office. °• Physical therapy: Additional activity guidelines to be provided by your physician or physical therapist at follow-up visits.  °• Driving: Do not recommend driving x 2 weeks post surgical, especially if surgery performed on right side. Should not drive while taking narcotic pain medications. It typically takes at least 2 weeks to restore sufficient neuromuscular function for normal reaction times for driving safety.  °• Call [phone] for questions or problems. Evenings you will be forwarded to the hospital operator.  Ask for the orthopaedic physician on call. Please call if you experience:  °  °o Redness, foul smelling, or persistent drainage from the surgical site  °o worsening knee pain and swelling not responsive to medication  °o any calf pain and or swelling of the lower leg  °o temperatures greater than   101.5 F o other questions or concerns   Thank you for allowing [place] to be a part of your care.   No Tylenol until 8:15pm!    Post Anesthesia Home Care Instructions  Activity: Get plenty of rest for the remainder of the day. A responsible individual must stay with you for 24 hours following the procedure.  For the next 24 hours, DO NOT: -Drive a car -Paediatric nurse -Drink alcoholic beverages -Take any medication unless instructed by your physician -Make any legal decisions or sign  important papers.  Meals: Start with liquid foods such as gelatin or soup. Progress to regular foods as tolerated. Avoid greasy, spicy, heavy foods. If nausea and/or vomiting occur, drink only clear liquids until the nausea and/or vomiting subsides. Call your physician if vomiting continues.  Special Instructions/Symptoms: Your throat may feel dry or sore from the anesthesia or the breathing tube placed in your throat during surgery. If this causes discomfort, gargle with warm salt water. The discomfort should disappear within 24 hours.  If you had a scopolamine patch placed behind your ear for the management of post- operative nausea and/or vomiting:  1. The medication in the patch is effective for 72 hours, after which it should be removed.  Wrap patch in a tissue and discard in the trash. Wash hands thoroughly with soap and water. 2. You may remove the patch earlier than 72 hours if you experience unpleasant side effects which may include dry mouth, dizziness or visual disturbances. 3. Avoid touching the patch. Wash your hands with soap and water after contact with the patch.

## 2019-08-25 ENCOUNTER — Other Ambulatory Visit: Payer: Self-pay | Admitting: Physician Assistant

## 2019-08-25 MED ORDER — OXYCODONE-ACETAMINOPHEN 5-325 MG PO TABS: 1.0000 | ORAL_TABLET | Freq: Four times a day (QID) | ORAL | 0 refills | Status: DC | PRN

## 2019-08-27 ENCOUNTER — Encounter (HOSPITAL_BASED_OUTPATIENT_CLINIC_OR_DEPARTMENT_OTHER): Payer: Self-pay | Admitting: Orthopaedic Surgery

## 2019-08-27 NOTE — Anesthesia Postprocedure Evaluation (Signed)
Anesthesia Post Note  Patient: Rutger Feest Collett  Procedure(s) Performed: LEFT KNEE ARTHROSCOPY WITH PARTIAL MEDIAL MENISCECTOMY (Left Knee)     Patient location during evaluation: PACU Anesthesia Type: General Level of consciousness: awake and alert Pain management: pain level controlled Vital Signs Assessment: post-procedure vital signs reviewed and stable Respiratory status: spontaneous breathing, nonlabored ventilation, respiratory function stable and patient connected to nasal cannula oxygen Cardiovascular status: stable and blood pressure returned to baseline Postop Assessment: no apparent nausea or vomiting Anesthetic complications: no    Last Vitals:  Vitals:   08/24/19 1442 08/24/19 1530  BP: (!) 166/78 (!) 153/86  Pulse: 79 87  Resp: 16 16  Temp: 36.7 C   SpO2: 96% 95%    Last Pain:  Vitals:   08/24/19 1530  TempSrc:   PainSc: 5                  Montez Hageman

## 2019-08-30 ENCOUNTER — Ambulatory Visit (INDEPENDENT_AMBULATORY_CARE_PROVIDER_SITE_OTHER): Payer: PPO | Admitting: Adult Health

## 2019-08-30 ENCOUNTER — Other Ambulatory Visit: Payer: Self-pay | Admitting: Internal Medicine

## 2019-08-30 ENCOUNTER — Other Ambulatory Visit: Payer: Self-pay

## 2019-08-30 ENCOUNTER — Encounter: Payer: Self-pay | Admitting: Adult Health

## 2019-08-30 ENCOUNTER — Telehealth: Payer: Self-pay | Admitting: Orthopaedic Surgery

## 2019-08-30 VITALS — BP 118/82 | HR 105 | Temp 97.7°F | Resp 16

## 2019-08-30 DIAGNOSIS — M7989 Other specified soft tissue disorders: Secondary | ICD-10-CM

## 2019-08-30 DIAGNOSIS — Z9889 Other specified postprocedural states: Secondary | ICD-10-CM

## 2019-08-30 DIAGNOSIS — M79662 Pain in left lower leg: Secondary | ICD-10-CM | POA: Diagnosis not present

## 2019-08-30 DIAGNOSIS — I82402 Acute embolism and thrombosis of unspecified deep veins of left lower extremity: Secondary | ICD-10-CM

## 2019-08-30 NOTE — Telephone Encounter (Signed)
Patient called triage. He had a knee arthroscopy on 08/24/2019. He called in this afternoon at 3:30pm stating that his knee is very swollen all the way down to his foot, along with increased pain and redness. No fever or drainage. Patient wanted to see Dr.Xu today, but already has an appointment tomorrow morning. I advised patient that Dr.Xu had already left for the day and that he should go to the ER for evaluation. Patient is going to have another family member take him.

## 2019-08-30 NOTE — Patient Instructions (Addendum)
Recommend starting xarelto - 2 tabs of 15 mg tonight, 1 tab in the morning  Please hold aspirin and plavix (clopidogrel) while on this medication   Ultrasound to check for DVT will be scheduled in the morning    Go to the ER if any chest pain, shortness of breath, nausea, dizziness, severe HA, changes vision/speech    Deep Vein Thrombosis  Deep vein thrombosis (DVT) is a condition in which a blood clot forms in a deep vein, such as a lower leg, thigh, or arm vein. A clot is blood that has thickened into a gel or solid. This condition is dangerous. It can lead to serious and even life-threatening complications if the clot travels to the lungs and causes a blockage (pulmonary embolism). It can also damage veins in the leg. This can result in leg pain, swelling, discoloration, and sores (post-thrombotic syndrome). What are the causes? This condition may be caused by:  A slowdown of blood flow.  Damage to a vein.  A condition that causes blood to clot more easily, such as an inherited clotting disorder. What increases the risk? The following factors may make you more likely to develop this condition:  Being overweight.  Being older, especially over age 32.  Sitting or lying down for more than four hours.  Being in the hospital.  Lack of physical activity (sedentary lifestyle).  Pregnancy, being in childbirth, or having recently given birth.  Taking medicines that contain estrogen, such as medicines to prevent pregnancy.  Smoking.  A history of any of the following: ? Blood clots or a blood clotting disease. ? Peripheral vascular disease. ? Inflammatory bowel disease. ? Cancer. ? Heart disease. ? Genetic conditions that affect how your blood clots, such as Factor V Leiden mutation. ? Neurological diseases that affect your legs (leg paresis). ? A recent injury, such as a car accident. ? Major or lengthy surgery. ? A central line placed inside a large vein. What are the  signs or symptoms? Symptoms of this condition include:  Swelling, pain, or tenderness in an arm or leg.  Warmth, redness, or discoloration in an arm or leg. If the clot is in your leg, symptoms may be more noticeable or worse when you stand or walk. Some people may not develop any symptoms. How is this diagnosed? This condition is diagnosed with:  A medical history and physical exam.  Tests, such as: ? Blood tests. These are done to check how well your blood clots. ? Ultrasound. This is done to check for clots. ? Venogram. For this test, contrast dye is injected into a vein and X-rays are taken to check for any clots. How is this treated? Treatment for this condition depends on:  The cause of your DVT.  Your risk for bleeding or developing more clots.  Any other medical conditions that you have. Treatment may include:  Taking a blood thinner (anticoagulant). This type of medicine prevents clots from forming. It may be taken by mouth, injected under the skin, or injected through an IV (catheter).  Injecting clot-dissolving medicines into the affected vein (catheter-directed thrombolysis).  Having surgery. Surgery may be done to: ? Remove the clot. ? Place a filter in a large vein to catch blood clots before they reach the lungs. Some treatments may be continued for up to six months. Follow these instructions at home: If you are taking blood thinners:  Take the medicine exactly as told by your health care provider. Some blood thinners need to be  taken at the same time every day. Do not skip a dose.  Talk with your health care provider before you take any medicines that contain aspirin or NSAIDs. These medicines increase your risk for dangerous bleeding.  Ask your health care provider about foods and drugs that could change the way the medicine works (may interact). Avoid those things if your health care provider tells you to do so.  Blood thinners can cause easy bruising and  may make it difficult to stop bleeding. Because of this: ? Be very careful when using knives, scissors, or other sharp objects. ? Use an electric razor instead of a blade. ? Avoid activities that could cause injury or bruising, and follow instructions about how to prevent falls.  Wear a medical alert bracelet or carry a card that lists what medicines you take. General instructions  Take over-the-counter and prescription medicines only as told by your health care provider.  Return to your normal activities as told by your health care provider. Ask your health care provider what activities are safe for you.  Wear compression stockings if recommended by your health care provider.  Keep all follow-up visits as told by your health care provider. This is important. How is this prevented? To lower your risk of developing this condition again:  For 30 or more minutes every day, do an activity that: ? Involves moving your arms and legs. ? Increases your heart rate.  When traveling for longer than four hours: ? Exercise your arms and legs every hour. ? Drink plenty of water. ? Avoid drinking alcohol.  Avoid sitting or lying for a long time without moving your legs.  If you have surgery or you are hospitalized, ask about ways to prevent blood clots. These may include taking frequent walks or using anticoagulants.  Stay at a healthy weight.  If you are a woman who is older than age 72, avoid unnecessary use of medicines that contain estrogen, such as some birth control pills.  Do not use any products that contain nicotine or tobacco, such as cigarettes and e-cigarettes. This is especially important if you take estrogen medicines. If you need help quitting, ask your health care provider. Contact a health care provider if:  You miss a dose of your blood thinner.  Your menstrual period is heavier than usual.  You have unusual bruising. Get help right away if:  You have: ? New or  increased pain, swelling, or redness in an arm or leg. ? Numbness or tingling in an arm or leg. ? Shortness of breath. ? Chest pain. ? A rapid or irregular heartbeat. ? A severe headache or confusion. ? A cut that will not stop bleeding.  There is blood in your vomit, stool, or urine.  You have a serious fall or accident, or you hit your head.  You feel light-headed or dizzy.  You cough up blood. These symptoms may represent a serious problem that is an emergency. Do not wait to see if the symptoms will go away. Get medical help right away. Call your local emergency services (911 in the [place] Do not drive yourself to the hospital. Summary  Deep vein thrombosis (DVT) is a condition in which a blood clot forms in a deep vein, such as a lower leg, thigh, or arm vein.  Symptoms can include swelling, warmth, pain, and redness in your leg or arm.  This condition may be treated with a blood thinner (anticoagulant medicine), medicine that is injected to dissolve blood  clots,compression stockings, or surgery.  If you are prescribed blood thinners, take them exactly as told. This information is not intended to replace advice given to you by your health care provider. Make sure you discuss any questions you have with your health care provider. Document Released: 10/11/2005 Document Revised: 09/23/2017 Document Reviewed: 03/11/2017 Elsevier Patient Education  [place].     Rivaroxaban oral tablets What is this medicine? RIVAROXABAN (ri va ROX a ban) is an anticoagulant (blood thinner). It is used to treat blood clots in the lungs or in the veins. It is also used to prevent blood clots in the lungs or in the veins. It is also used to lower the chance of stroke in people with a medical condition called atrial fibrillation. This medicine may be used for other purposes; ask your health care provider or pharmacist if you have questions. COMMON BRAND NAME(S): Xarelto, Xarelto Starter  Pack What should I tell my health care provider before I take this medicine? They need to know if you have any of these conditions:  antiphospholipid antibody syndrome  artificial heart valve  bleeding disorders  bleeding in the brain  blood in your stools (black or tarry stools) or if you have blood in your vomit  history of blood clots  history of stomach bleeding  kidney disease  liver disease  low blood counts, like low white cell, platelet, or red cell counts  recent or planned spinal or epidural procedure  take medicines that treat or prevent blood clots  an unusual or allergic reaction to rivaroxaban, other medicines, foods, dyes, or preservatives  pregnant or trying to get pregnant  breast-feeding How should I use this medicine? Take this medicine by mouth with a glass of water. Follow the directions on the prescription label. Take your medicine at regular intervals. Do not take it more often than directed. Do not stop taking except on your doctor's advice. Stopping this medicine may increase your risk of a blood clot. Be sure to refill your prescription before you run out of medicine. If you are taking this medicine after hip or knee replacement surgery, take it with or without food. If you are taking this medicine for atrial fibrillation, take it with your evening meal. If you are taking this medicine to treat blood clots, take it with food at the same time each day. If you are unable to swallow your tablet, you may crush the tablet and mix it in applesauce. Then, immediately eat the applesauce. You should eat more food right after you eat the applesauce containing the crushed tablet. Talk to your pediatrician regarding the use of this medicine in children. Special care may be needed. Overdosage: If you think you have taken too much of this medicine contact a poison control center or emergency room at once. NOTE: This medicine is only for you. Do not share this  medicine with others. What if I miss a dose? If you take your medicine once a day and miss a dose, take the missed dose as soon as you remember. If it is almost time for your next dose, take only that dose. Do not take double or extra doses. If you take your medicine twice a day and miss a dose, take the missed dose immediately. In this instance, 2 tablets may be taken at the same time. The next day you should take 1 tablet twice a day as directed. What may interact with this medicine? Do not take this medicine with any  of the following medications:  defibrotide This medicine may also interact with the following medications:  aspirin and aspirin-like medicines  certain antibiotics like erythromycin, azithromycin, and clarithromycin  certain medicines for fungal infections like ketoconazole and itraconazole  certain medicines for irregular heart beat like amiodarone, quinidine, dronedarone  certain medicines for seizures like carbamazepine, phenytoin  certain medicines that treat or prevent blood clots like warfarin, enoxaparin, and dalteparin  conivaptan  felodipine  indinavir  lopinavir; ritonavir  NSAIDS, medicines for pain and inflammation, like ibuprofen or naproxen  ranolazine  rifampin  ritonavir  SNRIs, medicines for depression, like desvenlafaxine, duloxetine, levomilnacipran, venlafaxine  SSRIs, medicines for depression, like citalopram, escitalopram, fluoxetine, fluvoxamine, paroxetine, sertraline  St. John's wort  verapamil This list may not describe all possible interactions. Give your health care provider a list of all the medicines, herbs, non-prescription drugs, or dietary supplements you use. Also tell them if you smoke, drink alcohol, or use illegal drugs. Some items may interact with your medicine. What should I watch for while using this medicine? Visit your healthcare professional for regular checks on your progress. You may need blood work done  while you are taking this medicine. Your condition will be monitored carefully while you are receiving this medicine. It is important not to miss any appointments. Avoid sports and activities that might cause injury while you are using this medicine. Severe falls or injuries can cause unseen bleeding. Be careful when using sharp tools or knives. Consider using an Copy. Take special care brushing or flossing your teeth. Report any injuries, bruising, or red spots on the skin to your healthcare professional. If you are going to need surgery or other procedure, tell your healthcare professional that you are taking this medicine. Wear a medical ID bracelet or chain. Carry a card that describes your disease and details of your medicine and dosage times. What side effects may I notice from receiving this medicine? Side effects that you should report to your doctor or health care professional as soon as possible:  allergic reactions like skin rash, itching or hives, swelling of the face, lips, or tongue  back pain  redness, blistering, peeling or loosening of the skin, including inside the mouth  signs and symptoms of bleeding such as bloody or black, tarry stools; red or dark-brown urine; spitting up blood or brown material that looks like coffee grounds; red spots on the skin; unusual bruising or bleeding from the eye, gums, or nose  signs and symptoms of a blood clot such as chest pain; shortness of breath; pain, swelling, or warmth in the leg  signs and symptoms of a stroke such as changes in vision; confusion; trouble speaking or understanding; severe headaches; sudden numbness or weakness of the face, arm or leg; trouble walking; dizziness; loss of coordination Side effects that usually do not require medical attention (report to your doctor or health care professional if they continue or are bothersome):  dizziness  muscle pain This list may not describe all possible side effects.  Call your doctor for medical advice about side effects. You may report side effects to FDA at 1-800-FDA-1088. Where should I keep my medicine? Keep out of the reach of children. Store at room temperature between 15 and 30 degrees C (59 and 86 degrees F). Throw away any unused medicine after the expiration date. NOTE: This sheet is a summary. It may not cover all possible information. If you have questions about this medicine, talk to your doctor, pharmacist, or  health care provider.  2020 Elsevier/Gold Standard (2019-01-08 09:45:59)

## 2019-08-30 NOTE — Progress Notes (Signed)
Assessment and Plan:  John [name] was seen today for edema.  Diagnoses and all orders for this visit:  Pain and swelling of left lower leg S/P left knee arthroscopy Highly concerning for DVT with tenderness, edema, asymmetrical calf size, + Homan's  Dr. Melford Aase in agreement and has already place an order for Lower extremity vascular ultrasound (DVT) STAT After discussion of risks, per recommendation by Dr. Melford Aase will initiate Xarelto; 7 tabs of sample 15 mg given; instructed to take 2 tabs tonight, then 1 tab in AM Hold plavix and ASA in the interim Vas [place] has been ordered and will call patient directly to schedule in AM Patient and family have been advised to go to the ER if any chest pain, shortness of breath, nausea, dizziness, severe HA, changes vision/speech and expressed understanding.  Follow up will be determined pending test results Keep follow up with ortho tomorrow for suture removal as scheduled  Further disposition pending results of labs. Discussed med's effects and SE's.   Over 15 minutes of exam, counseling, chart review, and critical decision making was performed.   Future Appointments  Date Time Provider [place]  08/31/2019 10:45 AM Leandrew Koyanagi, MD [facility] None  09/10/2019  1:30 PM Ward Givens, NP [facility] None  09/13/2019  1:45 PM [facility] 3 [id number]  09/25/2019  2:30 PM Garnet Sierras, NP [facility] None  09/28/2019  2:40 PM Burnell Blanks, MD [facility]  01/15/2020 10:00 AM Unk Pinto, MD [facility] None  04/21/2020  2:00 PM Vicie Mutters, PA-C [facility] None    ------------------------------------------------------------------------------------------------------------------   HPI BP 118/82   Pulse (!) 105   Temp 97.7 F (36.5 C)   Resp 16    84 y.o.male with hx of CAD s/p CABG, TIA, T2DM presents for evaluation of swollen and painful extremity. He notably recently underwent L knee arthroscopy  by Dr. Erlinda Hong on 08/24/2019 for acute medial meniscal tear. His daughter presents with him today.   He reports following surgery he has mild mild pain at surgical site, 3-4/10; He reports yesterday AM woke up and was notably more swollen through lower leg and ankle, pain much worse 8/10 throughout lower leg, again significant worse swelling today with persistent pain. He contacted ortho office and was advised concern for DVT and to present to ED which he strongly preferred not to do and presented here for evaluation.   He is currently on plavix and bASA due to TIA and cardiac history.   Past Medical History:  Diagnosis Date  . Anxiety   . Arthritis    "lower back; left ankle" (03/20/2014)  . ASTHMA   . Blood transfusion    "related to OHS, ulcers"  . CAD (coronary artery disease)    a. s/p 2V CABG 1996. b. Last cath 12/2016 -> patent grafts, med rx.  . CAD, ARTERY BYPASS GRAFT   . Cataract   . CKD (chronic kidney disease), stage II   . Colon polyps    adenomatous polyps  . COPD   . Depression   . Diabetes mellitus without complication (HCC)    no meds now  . Diverticulosis   . Duodenal ulcer without hemorrhage or perforation 12/29/2011  . GASTROESOPHAGEAL REFLUX DISEASE   . HYPERLIPIDEMIA   . Insomnia   . 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 Memory loss   . Nephrolithiasis    "I've had over 320 kidney stones" (03/20/2014)  . NEPHROLITHIASIS, HX OF 03/12/2009  . Parkinsonism ([place])   . Sleep  apnea    does not use CPAP  . Stroke [facility])    TIA  . SUPRAVENTRICULAR TACHYCARDIA, HX OF   . SVT (supraventricular tachycardia) ([place]) 03/12/2009  . TIA   . Ulcer      Allergies  Allergen Reactions  . Gabapentin Other (See Comments)    Severe confusion  . Prozac [Fluoxetine Hcl] Other (See Comments)    Patient state that it does not agree with him  . Soma [Carisoprodol] Other (See Comments)    Patient stated that it does not agree with him  . Beta Adrenergic Blockers Other (See Comments)     REACTION: Bradycardia    Current Outpatient Medications on File Prior to Visit  Medication Sig  . ALPRAZolam (XANAX) 1 MG tablet Take 1/2 to 1 tablet at Bedtime ONLY if needed for Sleep and please try to limit to 5 days /week to avoid addiction  . aspirin 81 MG tablet Take 81 mg by mouth daily.    . Cholecalciferol (VITAMIN D3) 2000 UNITS capsule Take 8,000 Units by mouth daily.   . clopidogrel (PLAVIX) 75 MG tablet TAKE 1 TABLET BY MOUTH ONCE DAILY  . escitalopram (LEXAPRO) 10 MG tablet Take 1 tablet daily for anxiety  . finasteride (PROSCAR) 5 MG tablet Take 1 tablet daily for prostate  . fludrocortisone (FLORINEF) 0.1 MG tablet Take 1 tablet 2 x /day for Low BP  . glucose blood (FREESTYLE LITE) test strip TEST BLOOD SUGAR ONCE DAILY  . HYDROcodone-acetaminophen (NORCO) 5-325 MG tablet Take 1-2 tablets by mouth 3 (three) times daily as needed.  . Lancets (FREESTYLE) lancets 1 each by Other route as needed for other. Use as instructed  . Melatonin 5 MG CAPS Take 10 mg by mouth at bedtime.   . methylPREDNISolone (MEDROL DOSEPAK) 4 MG TBPK tablet Take as directed  . nitroGLYCERIN (NITROSTAT) 0.4 MG SL tablet Sig: 1 tablet under tongue every 3 to 5 minutes as needed for Angina - Please Dispense # 2 bottles of # 25 tabs  . ondansetron (ZOFRAN) 4 MG tablet Take 1-2 tablets (4-8 mg total) by mouth every 8 (eight) hours as needed for nausea or vomiting.  [name] oxyCODONE-acetaminophen (PERCOCET) 5-325 MG tablet Take 1-2 tablets by mouth every 6 (six) hours as needed for severe pain.  . pantoprazole (PROTONIX) 40 MG tablet TAKE ONE TABLET BY MOUTH ONCE DAILY  . potassium citrate (UROCIT-K) 10 MEQ (1080 MG) SR tablet TAKE ONE TABLET BY MOUTH TWICE DAILY  . prochlorperazine (COMPAZINE) 5 MG tablet Take 1 tablet 3 x /day if needed for nausea  . QUEtiapine (SEROQUEL) 25 MG tablet Take 1 tablet 3 x /day  . silodosin (RAPAFLO) 8 MG CAPS capsule Take 1 capsule at Bedtime for Prostate  . simvastatin (ZOCOR) 80  MG tablet TAKE ONE TABLET BY MOUTH AT BEDTIME  . traMADol (ULTRAM) 50 MG tablet 1-2 tid prn pain   No current facility-administered medications on file prior to visit.     ROS: Review of Systems  Constitutional: Negative for chills, diaphoresis, fever and malaise/fatigue.  HENT: Negative for hearing loss.   Eyes: Negative for blurred vision.  Respiratory: Negative for cough, sputum production, shortness of breath and wheezing.   Cardiovascular: Positive for leg swelling (left, persistent x 2 days). Negative for chest pain, palpitations, orthopnea and PND.  Gastrointestinal: Negative for abdominal pain, constipation, heartburn and nausea.  Musculoskeletal: Positive for joint pain (left knee). Negative for myalgias.  Neurological: Negative for dizziness, sensory change, speech change, focal weakness  and headaches.     Physical Exam:  BP 118/82   Pulse (!) 105   Temp 97.7 F (36.5 C)   Resp 16   General Appearance: Well nourished, well dressed elder in no apparent distress. Eyes: PERRLA, conjunctiva no swelling or erythema ENT/Mouth: Ext aud canals clear, TMs without erythema, bulging. No erythema, swelling, or exudate on post pharynx.  Tonsils not swollen or erythematous. Hearing normal.  Neck: Supple Respiratory: Respiratory effort normal, BS equal bilaterally without rales, rhonchi, wheezing or stridor.  Cardio: RRR with no MRGs. Left leg with generalized pitting edema through lower leg, ankle. Pedal and posterior tibilal pulses symmetrical and intact. Extremity is warm. Left calf 36 cm, R 31 cm. + Homan's;  Abdomen: Soft, + BS.  Non tender, no guarding. Lymphatics: Non tender without lymphadenopathy.  Musculoskeletal: L knee with well healing arthroscopic surgical sites, some generalized edema throughout lower leg, 36 cm around calf, generlized tenderness through left calf and lower leg without point tenderness. Antalgic gait with walker.  Skin: Warm, dry without rashes, lesions;  he has scattered ecchymosis of left knee, lower leg and ankle Neuro: Cranial nerves intact. Normal muscle tone, no cerebellar symptoms. Sensation intact.  Psych: Awake and oriented X 3, normal affect, Insight and Judgment appropriate.     Izora Ribas, NP 5:28 PM [facility]

## 2019-08-30 NOTE — Telephone Encounter (Signed)
See message.

## 2019-08-31 ENCOUNTER — Ambulatory Visit (HOSPITAL_COMMUNITY)
Admission: RE | Admit: 2019-08-31 | Discharge: 2019-08-31 | Disposition: A | Discharge status: Home / Self Care | Payer: PPO | Source: Ambulatory Visit | Attending: Internal Medicine | Admitting: Internal Medicine

## 2019-08-31 ENCOUNTER — Other Ambulatory Visit: Payer: Self-pay | Admitting: Internal Medicine

## 2019-08-31 ENCOUNTER — Inpatient Hospital Stay: Payer: PPO | Admitting: Orthopaedic Surgery

## 2019-08-31 ENCOUNTER — Encounter (HOSPITAL_COMMUNITY): Payer: PPO

## 2019-08-31 ENCOUNTER — Telehealth: Payer: Self-pay | Admitting: Orthopaedic Surgery

## 2019-08-31 DIAGNOSIS — I82402 Acute embolism and thrombosis of unspecified deep veins of left lower extremity: Secondary | ICD-10-CM | POA: Diagnosis not present

## 2019-08-31 MED ORDER — RIVAROXABAN (XARELTO) VTE STARTER PACK (15 & 20 MG): ORAL_TABLET | ORAL | 0 refills | Status: DC

## 2019-08-31 NOTE — Telephone Encounter (Signed)
FYI... I called [name] and made patient post op appt for Tuesday 09/04/2019. She did want you to know that she received results from Vascular study and patient does not have DVT.

## 2019-08-31 NOTE — Progress Notes (Signed)
LLE venous duplex       has been completed. Preliminary results can be found under CV proc through chart review. June Leap, BS, RDMS, RVT    Called negative results to [facility] office and spoke to receptionist. She will notify doctor and patient is ok to leave.

## 2019-08-31 NOTE — Telephone Encounter (Signed)
Thanks

## 2019-08-31 NOTE — Telephone Encounter (Signed)
Patient's daughter in law called to r/s the patient's appointment due to him needing to have a procedure at another facility.  She is wanting to know if the stitches would be okay to stay in until he can see Dr. Erlinda Hong next week or does he need to see someone else today.  [phone].  Thank you.

## 2019-09-04 ENCOUNTER — Other Ambulatory Visit: Payer: Self-pay | Admitting: Internal Medicine

## 2019-09-04 ENCOUNTER — Encounter: Payer: Self-pay | Admitting: Orthopaedic Surgery

## 2019-09-04 ENCOUNTER — Ambulatory Visit (INDEPENDENT_AMBULATORY_CARE_PROVIDER_SITE_OTHER): Payer: PPO | Admitting: Physician Assistant

## 2019-09-04 ENCOUNTER — Other Ambulatory Visit: Payer: Self-pay

## 2019-09-04 DIAGNOSIS — Z9889 Other specified postprocedural states: Secondary | ICD-10-CM

## 2019-09-04 DIAGNOSIS — F419 Anxiety disorder, unspecified: Secondary | ICD-10-CM

## 2019-09-04 MED ORDER — OXYCODONE-ACETAMINOPHEN 5-325 MG PO TABS: 1.0000 | ORAL_TABLET | Freq: Two times a day (BID) | ORAL | 0 refills | Status: DC | PRN

## 2019-09-04 NOTE — Progress Notes (Signed)
Post-Op Visit Note   Patient: John [name]           Date of Birth: [birth year]           MRN: [id number] Visit Date: 09/04/2019 PCP: Unk Pinto, MD   Assessment & Plan:  Chief Complaint:  Chief Complaint  Patient presents with  . Left Knee - Pain   Visit Diagnoses:  1. S/P left knee arthroscopy     Plan: Patient is a pleasant 84 year old gentleman who presents our clinic today 11 days status post left knee arthroscopic debridement medial meniscus, date of surgery 08/24/2019.  He has been complaining of moderate pain since surgery.  He does note that this has improved over the past few days.  He has been taking Percocet as needed.  He was sent to the ED late last week for possible DVT.  Venous Doppler ultrasound ruled this out.  Examination of his left knee reveals a small effusion.  Range of motion 10 to about 90 degrees.  Does have well-healing surgical portals with nylon sutures in place.  Calf is soft nontender.  He is neurovascular intact distally.  Today, nylon sutures were removed and Steri-Strips applied.  We will mail the patient a home exercise program.  I have called in 1 more prescription of Percocet that he will use sparingly.  He will follow-up with [place] in 5 weeks time for recheck.  Call with concerns or questions.  Follow-Up Instructions: Return in about 5 weeks (around 10/09/2019).   Orders:  No orders of the defined types were placed in this encounter.  Meds ordered this encounter  Medications  . oxyCODONE-acetaminophen (PERCOCET) 5-325 MG tablet    Sig: Take 1-2 tablets by mouth 2 (two) times daily as needed for severe pain.    Dispense:  20 tablet    Refill:  0    Imaging: No new imaging   PMFS History: Patient Active Problem List   Diagnosis Date Noted  . Acute medial meniscus tear of left knee 08/09/2019  . Insomnia 02/19/2018  . Diabetic peripheral neuropathy ([place]) 11/21/2017  . BMI 21.0-21.9, adult 09/04/2015  . Esophageal reflux  07/09/2015  . Autonomic postural hypotension 02/06/2015  . CKD stage G3a/A1, GFR 45-59 and albumin creatinine ratio <30 mg/g 03/20/2014  . Medication management 02/06/2014  . Vitamin D deficiency 02/06/2014  . Gastric AV malformation 10/30/2012  . History of colonic polyps 09/14/2011  . Mixed hyperlipidemia 03/12/2009  . Essential hypertension 03/12/2009  . ASCAD/CABG 03/12/2009  . History of TIA (transient ischemic attack) 03/12/2009  . Asthma 03/12/2009  . COPD (chronic obstructive pulmonary disease) ([place]) 03/12/2009  . GERD 03/12/2009   Past Medical History:  Diagnosis Date  . Anxiety   . Arthritis    "lower back; left ankle" (03/20/2014)  . ASTHMA   . Blood transfusion    "related to OHS, ulcers"  . CAD (coronary artery disease)    a. s/p 2V CABG 1996. b. Last cath 12/2016 -> patent grafts, med rx.  . CAD, ARTERY BYPASS GRAFT   . Cataract   . CKD (chronic kidney disease), stage II   . Colon polyps    adenomatous polyps  . COPD   . Depression   . Diabetes mellitus without complication (HCC)    no meds now  . Diverticulosis   . Duodenal ulcer without hemorrhage or perforation 12/29/2011  . GASTROESOPHAGEAL REFLUX DISEASE   . HYPERLIPIDEMIA   . Insomnia   . 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  Memory loss   . Nephrolithiasis    "I've had over 320 kidney stones" (03/20/2014)  . NEPHROLITHIASIS, HX OF 03/12/2009  . Parkinsonism ([place])   . Sleep apnea    does not use CPAP  . Stroke [facility])    TIA  . SUPRAVENTRICULAR TACHYCARDIA, HX OF   . SVT (supraventricular tachycardia) ([place]) 03/12/2009  . TIA   . Ulcer     Family History  Problem Relation Age of Onset  . Alcohol abuse Father   . Stroke Mother   . Heart disease Brother   . Diabetes Brother   . Heart disease Paternal Grandmother   . Colon cancer Neg Hx   . Stomach cancer Neg Hx   . Esophageal cancer Neg Hx   . Rectal cancer Neg Hx     Past Surgical History:  Procedure Laterality Date  . ANKLE FRACTURE SURGERY Left ~ 2012   . CARDIAC CATHETERIZATION    . COLONOSCOPY    . CORONARY ARTERY BYPASS GRAFT  1996   CABG X2  . CYSTOSCOPY WITH URETEROSCOPY, STONE BASKETRY AND STENT PLACEMENT    . Cystourethroscopy with stent removal.    . EXCISIONAL HEMORRHOIDECTOMY    . GASTRECTOMY    . HIATAL HERNIA REPAIR    . IRRIGATION AND DEBRIDEMENT SEBACEOUS CYST     "off my back  . KNEE ARTHROSCOPY WITH MEDIAL MENISECTOMY Left 08/24/2019   Procedure: LEFT KNEE ARTHROSCOPY WITH PARTIAL MEDIAL MENISCECTOMY;  Surgeon: Leandrew Koyanagi, MD;  Location: [place];  Service: Orthopedics;  Laterality: Left;  . LEFT HEART CATH AND CORS/GRAFTS ANGIOGRAPHY N/A 12/23/2016   Procedure: Left Heart Cath and Cors/Grafts Angiography;  Surgeon: Burnell Blanks, MD;  Location: [place] [facility];  Service: Cardiovascular;  Laterality: N/A;  . POLYPECTOMY    . PROSTATE SURGERY     "took the center of my prostate out"  . UPPER GASTROINTESTINAL ENDOSCOPY    . VAGOTOMY     Social History   Occupational History  . 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 Smoking status: Former Smoker    Packs/day: 0.50    Types: Cigarettes    Quit date: 10/25/1962    Years since quitting: 56.8  . Smokeless tobacco: Former Systems developer    Types: [place] date: 04/15/2018  Substance and Sexual Activity  . Alcohol use: No    Alcohol/week: 0.0 standard drinks  . Drug use: No  . Sexual activity: Yes

## 2019-09-05 ENCOUNTER — Telehealth: Payer: Self-pay | Admitting: Orthopaedic Surgery

## 2019-09-05 NOTE — Telephone Encounter (Signed)
Sonia Side from [place] at [place] called wanting to let you know that the patient's insurance is not in network with them and they will not be able to see him.  He said thank you for the referral though.  CB#[phone].  Thank you.

## 2019-09-05 NOTE — Telephone Encounter (Signed)
Can you let him know this was mailed.

## 2019-09-05 NOTE — Telephone Encounter (Signed)
Wrong Patient - Disregard previous message.  Patient called stating that John [name] was suppose to give him a list of exercises to do at home, but did not get one.  CB#[phone].  Thank you.

## 2019-09-10 ENCOUNTER — Ambulatory Visit: Payer: PPO | Admitting: Adult Health

## 2019-09-13 ENCOUNTER — Other Ambulatory Visit: Payer: Self-pay

## 2019-09-13 ENCOUNTER — Ambulatory Visit (HOSPITAL_COMMUNITY): Payer: PPO | Attending: Cardiology

## 2019-09-13 DIAGNOSIS — I2510 Atherosclerotic heart disease of native coronary artery without angina pectoris: Secondary | ICD-10-CM | POA: Diagnosis not present

## 2019-09-13 DIAGNOSIS — I351 Nonrheumatic aortic (valve) insufficiency: Secondary | ICD-10-CM | POA: Insufficient documentation

## 2019-09-24 NOTE — Patient Instructions (Addendum)
We will contact you with your lab results in 1-3 days via telelphone.  Please let us know if you would like a copy of these results mailed to you.  Continue to stay active and the exercises from physical therapy.  Today we flushed both ears in office today.  Both ears of clear of wax at this time.  Use a dropper or use a cap to put peroxide in ears every two weeks.  Let peroxide sit in ear for 3-63min.  Make sure this is warm or room temperature.  DO NOT put anything COLD into your ears, it will make you nauseated or dizzy.   THEN use olive oil, or mineral oil 2-4 drops in the effected ear an for 3-4 nights in a row.  Do this before bed and a use cotton ball loosely in ear  to prevent excess on your pillow.   After 3-4 nights of the oil drops. Stand in shower and let warm water run into your ears to flush them.  You may also use a syringe or baby bulb syringe with warm water to flush out your ears.  This should help to remove the wax you have softened.  Be sure to contact the office if you experience decreased hearing, pain in ears or dizziness.    Vit D  & Vit C 1,000 mg   are recommended to help protect  against the Covid-19 and other Corona viruses.    Also it's recommended  to take  Zinc 50 mg  to help  protect against the Covid-19   and best place to get  is also on [email or web address]  and don't pay more than 6-8 cents /pill !  ================================ Coronavirus (COVID-19) Are you at risk?  Are you at risk for the Coronavirus (COVID-19)?  To be considered HIGH RISK for Coronavirus (COVID-19), you have to meet the following criteria:  . Traveled to [place], [place], [place], [place] or [place]; or in the [place] to [place], [place], [place]  . or [place]; and have fever, cough, and shortness of breath within the last 2 weeks of travel OR . Been in close contact with a person diagnosed with COVID-19 within the last 2 weeks and have  . fever, cough,and shortness  of breath .  . IF YOU DO NOT MEET THESE CRITERIA, YOU ARE CONSIDERED LOW RISK FOR COVID-19.  What to do if you are HIGH RISK for COVID-19?  [name] If you are having a medical emergency, call 911. . Seek medical care right away. Before you go to a doctor's office, urgent care or emergency department, .  call ahead and tell them about your recent travel, contact with someone diagnosed with COVID-19  .  and your symptoms.  . You should receive instructions from your physician's office regarding next steps of care.  . When you arrive at healthcare provider, tell the healthcare staff immediately you have returned from  . visiting [place], [place], [place], [place] or [place]; or traveled in the [place] to [place], [place],  . [place] or [place] in the last two weeks or you have been in close contact with a person diagnosed with  . COVID-19 in the last 2 weeks.   . Tell the health care staff about your symptoms: fever, cough and shortness of breath. . After you have been seen by a medical provider, you will be either: o Tested for (COVID-19) and discharged home on quarantine except to seek medical  care if  o symptoms worsen, and asked to  - Stay home and avoid contact with others until you get your results (4-5 days)  - Avoid travel on public transportation if possible (such as bus, train, or airplane) or o Sent to the Emergency Department by EMS for evaluation, COVID-19 testing  and  o possible admission depending on your condition and test results.  What to do if you are LOW RISK for COVID-19?  Reduce your risk of any infection by using the same precautions used for avoiding the common cold or flu:  [name] Wash your hands often with soap and warm water for at least 20 seconds.  If soap and water are not readily available,  . use an alcohol-based hand sanitizer with at least 60% alcohol.  . If coughing or sneezing, cover your mouth and nose by coughing or sneezing into the elbow areas of  your shirt or coat, .  into a tissue or into your sleeve (not your hands). . Avoid shaking hands with others and consider head nods or verbal greetings only. . Avoid touching your eyes, nose, or mouth with unwashed hands.  . Avoid close contact with people who are sick. . Avoid places or events with large numbers of people in one location, like concerts or sporting events. . Carefully consider travel plans you have or are making. . If you are planning any travel outside or inside the [place], visit the CDC's Travelers' Health webpage for the latest health notices. . If you have some symptoms but not all symptoms, continue to monitor at home and seek medical attention  . if your symptoms worsen. . If you are having a medical emergency, call 911. >>>>>>>>>>>>>>>>>>>>>>>>>>>>>>>>>>>>>>>>>>>>>>>>>>>>>>> We Do NOT Approve of  [facility] [place] Soliciting Our Patients  To Do Home Visits  & We Do NOT Approve of [employer] > > > > > > > > > > > > > > > > > > > > > > > > > > > > > > > > > > >  > > > >   Preventive Care for Adults  A healthy lifestyle and preventive care can promote health and wellness. Preventive health guidelines for men include the following key practices:  A routine yearly physical is a good way to check with your health care provider about your health and preventative screening. It is a chance to share any concerns and updates on your health and to receive a thorough exam.  Visit your dentist for a routine exam and preventative care every 6 months. Brush your teeth twice a day and floss once a day. Good oral hygiene prevents tooth decay and gum disease.  The frequency of eye exams is based on your age, health, family medical history, use of contact lenses, and other factors. Follow your health care provider's recommendations for frequency of eye exams.  Eat a healthy diet. Foods such as vegetables, fruits, whole grains, low-fat dairy products, and lean  protein foods contain the nutrients you need without too many calories. Decrease your intake of foods high in solid fats, added sugars, and salt. Eat the right amount of calories for you. Get information about a proper diet from your health care provider, if necessary.  Regular physical exercise is one of the most important things you can do for your health. Most adults should get at least 150 minutes of moderate-intensity exercise (any activity that increases your heart rate and causes you to sweat) each week.  In addition, most adults need muscle-strengthening exercises on 2 or more days a week.  Maintain a healthy weight. The body mass index (BMI) is a screening tool to identify possible weight problems. It provides an estimate of body fat based on height and weight. Your health care provider can find your BMI and can help you achieve or maintain a healthy weight. For adults 20 years and older:  A BMI below 18.5 is considered underweight.  A BMI of 18.5 to 24.9 is normal.  A BMI of 25 to 29.9 is considered overweight.  A BMI of 30 and above is considered obese.  Maintain normal blood lipids and cholesterol levels by exercising and minimizing your intake of saturated fat. Eat a balanced diet with plenty of fruit and vegetables. Blood tests for lipids and cholesterol should begin at age 70 and be repeated every 5 years. If your lipid or cholesterol levels are high, you are over 50, or you are at high risk for heart disease, you may need your cholesterol levels checked more frequently. Ongoing high lipid and cholesterol levels should be treated with medicines if diet and exercise are not working.  If you smoke, find out from your health care provider how to quit. If you do not use tobacco, do not start.  Lung cancer screening is recommended for adults aged 52-80 years who are at high risk for developing lung cancer because of a history of smoking. A yearly low-dose CT scan of the lungs is  recommended for people who have at least a 30-pack-year history of smoking and are a current smoker or have quit within the past 15 years. A pack year of smoking is smoking an average of 1 pack of cigarettes a day for 1 year (for example: 1 pack a day for 30 years or 2 packs a day for 15 years). Yearly screening should continue until the smoker has stopped smoking for at least 15 years. Yearly screening should be stopped for people who develop a health problem that would prevent them from having lung cancer treatment.  If you choose to drink alcohol, do not have more than 2 drinks per day. One drink is considered to be 12 ounces (355 mL) of beer, 5 ounces (148 mL) of wine, or 1.5 ounces (44 mL) of liquor.  Avoid use of street drugs. Do not share needles with anyone. Ask for help if you need support or instructions about stopping the use of drugs.  High blood pressure causes heart disease and increases the risk of stroke. Your blood pressure should be checked at least every 1-2 years. Ongoing high blood pressure should be treated with medicines, if weight loss and exercise are not effective.  If you are 60-85 years old, ask your health care provider if you should take aspirin to prevent heart disease.  Diabetes screening involves taking a blood sample to check your fasting blood sugar level. Testing should be considered at a younger age or be carried out more frequently if you are overweight and have at least 1 risk factor for diabetes.  Colorectal cancer can be detected and often prevented. Most routine colorectal cancer screening begins at the age of 51 and continues through age 78. However, your health care provider may recommend screening at an earlier age if you have risk factors for colon cancer. On a yearly basis, your health care provider may provide home test kits to check for hidden blood in the stool. Use of a small camera at the end  of a tube to directly examine the colon (sigmoidoscopy or  colonoscopy) can detect the earliest forms of colorectal cancer. Talk to your health care provider about this at age 73, when routine screening begins. Direct exam of the colon should be repeated every 5-10 years through age 70, unless early forms of precancerous polyps or small growths are found.  Hepatitis C blood testing is recommended for all people born from [birth year] through 1963 and any individual with known risks for hepatitis C.  Screening for abdominal aortic aneurysm (AAA)  by ultrasound is recommended for people who have history of high blood pressure or who are current or former smokers.  Healthy men should  receive prostate-specific antigen (PSA) blood tests as part of routine cancer screening. Talk with your health care provider about prostate cancer screening.  Testicular cancer screening is  recommended for adult males. Screening includes self-exam, a health care provider exam, and other screening tests. Consult with your health care provider about any symptoms you have or any concerns you have about testicular cancer.  Use sunscreen. Apply sunscreen liberally and repeatedly throughout the day. You should seek shade when your shadow is shorter than you. Protect yourself by wearing long sleeves, pants, a wide-brimmed hat, and sunglasses year round, whenever you are outdoors.  Once a month, do a whole-body skin exam, using a mirror to look at the skin on your back. Tell your health care provider about new moles, moles that have irregular borders, moles that are larger than a pencil eraser, or moles that have changed in shape or color.  Stay current with required vaccines (immunizations).  Influenza vaccine. All adults should be immunized every year.  Tetanus, diphtheria, and acellular pertussis (Td, Tdap) vaccine. An adult who has not previously received Tdap or who does not know his vaccine status should receive 1 dose of Tdap. This initial dose should be followed by tetanus and  diphtheria toxoids (Td) booster doses every 10 years. Adults with an unknown or incomplete history of completing a 3-dose immunization series with Td-containing vaccines should begin or complete a primary immunization series including a Tdap dose. Adults should receive a Td booster every 10 years.  Zoster vaccine. One dose is recommended for adults aged 69 years or older unless certain conditions are present.    PREVNAR - Pneumococcal 13-valent conjugate (PCV13) vaccine. When indicated, a person who is uncertain of his immunization history and has no record of immunization should receive the PCV13 vaccine. An adult aged 65 years or older who has certain medical conditions and has not been previously immunized should receive 1 dose of PCV13 vaccine. This PCV13 should be followed with a dose of pneumococcal polysaccharide (PPSV23) vaccine. The PPSV23 vaccine dose should be obtained 1 or more year(s)after the dose of PCV13 vaccine. An adult aged 79 years or older who has certain medical conditions and previously received 1 or more doses of PPSV23 vaccine should receive 1 dose of PCV13. The PCV13 vaccine dose should be obtained 1 or more years after the last PPSV23 vaccine dose.    PNEUMOVAX - Pneumococcal polysaccharide (PPSV23) vaccine. When PCV13 is also indicated, PCV13 should be obtained first. All adults aged 23 years and older should be immunized. An adult younger than age 55 years who has certain medical conditions should be immunized. Any person who resides in a nursing home or long-term care facility should be immunized. An adult smoker should be immunized. People with an immunocompromised condition and certain other conditions should receive both  PCV13 and PPSV23 vaccines. People with human immunodeficiency virus (HIV) infection should be immunized as soon as possible after diagnosis. Immunization during chemotherapy or radiation therapy should be avoided. Routine use of PPSV23 vaccine is not  recommended for American Indians, [place] Natives, or people younger than 65 years unless there are medical conditions that require PPSV23 vaccine. When indicated, people who have unknown immunization and have no record of immunization should receive PPSV23 vaccine. One-time revaccination 5 years after the first dose of PPSV23 is recommended for people aged 19-64 years who have chronic kidney failure, nephrotic syndrome, asplenia, or immunocompromised conditions. People who received 1-2 doses of PPSV23 before age 83 years should receive another dose of PPSV23 vaccine at age 22 years or later if at least 5 years have passed since the previous dose. Doses of PPSV23 are not needed for people immunized with PPSV23 at or after age 29 years.    Hepatitis A vaccine. Adults who wish to be protected from this disease, have certain high-risk conditions, work with hepatitis A-infected animals, work in hepatitis A research labs, or travel to or work in countries with a high rate of hepatitis A should be immunized. Adults who were previously unvaccinated and who anticipate close contact with an international adoptee during the first 60 days after arrival in the [place] from a country with a high rate of hepatitis A should be immunized.    Hepatitis B vaccine. Adults should be immunized if they wish to be protected from this disease, have certain high-risk conditions, may be exposed to blood or other infectious body fluids, are household contacts or sex partners of hepatitis B positive people, are clients or workers in certain care facilities, or travel to or work in countries with a high rate of hepatitis B.   Preventive Service / Frequency   Ages 71 and over  Blood pressure check.  Lipid and cholesterol check.  Lung cancer screening. / Every year if you are aged 4-80 years and have a 30-pack-year history of smoking and currently smoke or have quit within the past 15 years. Yearly screening is stopped  once you have quit smoking for at least 15 years or develop a health problem that would prevent you from having lung cancer treatment.  Fecal occult blood test (FOBT) of stool. You may not have to do this test if you get a colonoscopy every 10 years.  Flexible sigmoidoscopy** or colonoscopy.** / Every 5 years for a flexible sigmoidoscopy or every 10 years for a colonoscopy beginning at age 68 and continuing until age 64.  Hepatitis C blood test.** / For all people born from [birth year] through 1963 and any individual with known risks for hepatitis C.  Abdominal aortic aneurysm (AAA) screening./ Screening current or former smokers or have Hypertension.  Skin self-exam. / Monthly.  Influenza vaccine. / Every year.  Tetanus, diphtheria, and acellular pertussis (Tdap/Td) vaccine.** / 1 dose of Td every 10 years.   Zoster vaccine.** / 1 dose for adults aged 39 years or older.         Pneumococcal 13-valent conjugate (PCV13) vaccine.    Pneumococcal polysaccharide (PPSV23) vaccine.     Hepatitis A vaccine.** / Consult your health care provider.  Hepatitis B vaccine.** / Consult your health care provider. Screening for abdominal aortic aneurysm (AAA)  by ultrasound is recommended for people who have history of high blood pressure or who are current or former smokers. ++++++++++ Recommend Adult Low Dose Aspirin or  coated  Aspirin 81  mg daily  To reduce risk of Colon Cancer 40 %,  Skin Cancer 26 % ,  Malignant Melanoma 46%  and  Pancreatic cancer 60% ++++++++++++++++++++++ Vitamin D goal  is between 70-100.  Please make sure that you are taking your Vitamin D as directed.  It is very important as a natural anti-inflammatory  helping hair, skin, and nails, as well as reducing stroke and heart attack risk.  It helps your bones and helps with mood. It also decreases numerous cancer risks so please take it as directed.  Low Vit D is associated with a 200-300% higher risk for CANCER  and  200-300% higher risk for HEART   ATTACK  &  STROKE.   .....................................[name] It is also associated with higher death rate at younger ages,  autoimmune diseases like Rheumatoid arthritis, Lupus, Multiple Sclerosis.    Also many other serious conditions, like depression, Alzheimer's Dementia, infertility, muscle aches, fatigue, fibromyalgia - just to name a few. ++++++++++++++++++++++ Recommend the book "The END of DIETING" by Dr Excell Seltzer  & the book "The END of DIABETES " by Dr Excell Seltzer At [email or web address] - get book & Audio CD's    Being diabetic has a  300% increased risk for heart attack, stroke, cancer, and alzheimer- type vascular dementia. It is very important that you work harder with diet by avoiding all foods that are white. Avoid white rice (brown & wild rice is OK), white potatoes (sweetpotatoes in moderation is OK), White bread or wheat bread or anything made out of white flour like bagels, donuts, rolls, buns, biscuits, cakes, pastries, cookies, pizza crust, and pasta (made from white flour & egg whites) - vegetarian pasta or spinach or wheat pasta is OK. Multigrain breads like Arnold's or [employer], or multigrain sandwich thins or flatbreads.  Diet, exercise and weight loss can reverse and cure diabetes in the early stages.  Diet, exercise and weight loss is very important in the control and prevention of complications of diabetes which affects every system in your body, ie. Brain - dementia/stroke, eyes - glaucoma/blindness, heart - heart attack/heart failure, kidneys - dialysis, stomach - gastric paralysis, intestines - malabsorption, nerves - severe painful neuritis, circulation - gangrene & loss of a leg(s), and finally cancer and Alzheimers.    I recommend avoid fried & greasy foods,  sweets/candy, white rice (brown or wild rice or Quinoa is OK), white potatoes (sweet potatoes are OK) - anything made from white flour - bagels, doughnuts, rolls, buns,  biscuits,white and wheat breads, pizza crust and traditional pasta made of white flour & egg white(vegetarian pasta or spinach or wheat pasta is OK).  Multi-grain bread is OK - like multi-grain flat bread or sandwich thins. Avoid alcohol in excess. Exercise is also important.    Eat all the vegetables you want - avoid meat, especially red meat and dairy - especially cheese.  Cheese is the most concentrated form of trans-fats which is the worst thing to clog up our arteries. Veggie cheese is OK which can be found in the fresh produce section at [employer] or [employer] or [employer]  ++++++++++++++++++++++ DASH Eating Plan  DASH stands for "Dietary Approaches to Stop Hypertension."   The DASH eating plan is a healthy eating plan that has been shown to reduce high blood pressure (hypertension). Additional health benefits may include reducing the risk of type 2 diabetes mellitus, heart disease, and stroke. The DASH eating plan may also help with weight loss. WHAT DO I NEED TO  KNOW ABOUT THE DASH EATING PLAN? For the DASH eating plan, you will follow these general guidelines:  Choose foods with a percent daily value for sodium of less than 5% (as listed on the food label).  Use salt-free seasonings or herbs instead of table salt or sea salt.  Check with your health care provider or pharmacist before using salt substitutes.  Eat lower-sodium products, often labeled as "lower sodium" or "no salt added."  Eat fresh foods.  Eat more vegetables, fruits, and low-fat dairy products.  Choose whole grains. Look for the word "whole" as the first word in the ingredient list.  Choose fish   Limit sweets, desserts, sugars, and sugary drinks.  Choose heart-healthy fats.  Eat veggie cheese   Eat more home-cooked food and less restaurant, buffet, and fast food.  Limit fried foods.  Cook foods using methods other than frying.  Limit canned vegetables. If you do use them, rinse them well to  decrease the sodium.  When eating at a restaurant, ask that your food be prepared with less salt, or no salt if possible.                      WHAT FOODS CAN I EAT? Read Dr Fara Olden Fuhrman's books on The End of Dieting & The End of Diabetes  Grains Whole grain or whole wheat bread. Brown rice. Whole grain or whole wheat pasta. Quinoa, bulgur, and whole grain cereals. Low-sodium cereals. Corn or whole wheat flour tortillas. Whole grain cornbread. Whole grain crackers. Low-sodium crackers.  Vegetables Fresh or frozen vegetables (raw, steamed, roasted, or grilled). Low-sodium or reduced-sodium tomato and vegetable juices. Low-sodium or reduced-sodium tomato sauce and paste. Low-sodium or reduced-sodium canned vegetables.   Fruits All fresh, canned (in natural juice), or frozen fruits.  Protein Products  All fish and seafood.  Dried beans, peas, or lentils. Unsalted nuts and seeds. Unsalted canned beans.  Dairy Low-fat dairy products, such as skim or 1% milk, 2% or reduced-fat cheeses, low-fat ricotta or cottage cheese, or plain low-fat yogurt. Low-sodium or reduced-sodium cheeses.  Fats and Oils Tub margarines without trans fats. Light or reduced-fat mayonnaise and salad dressings (reduced sodium). Avocado. Safflower, olive, or canola oils. Natural peanut or almond butter.  Other Unsalted popcorn and pretzels. The items listed above may not be a complete list of recommended foods or beverages. Contact your dietitian for more options.  ++++++++++++++++++++  WHAT FOODS ARE NOT RECOMMENDED? Grains/ White flour or wheat flour White bread. White pasta. White rice. Refined cornbread. Bagels and croissants. Crackers that contain trans fat.  Vegetables  Creamed or fried vegetables. Vegetables in a . Regular canned vegetables. Regular canned tomato sauce and paste. Regular tomato and vegetable juices.  Fruits Dried fruits. Canned fruit in light or heavy syrup. Fruit juice.  Meat and Other  Protein Products Meat in general - RED meat & White meat.  Fatty cuts of meat. Ribs, chicken wings, all processed meats as bacon, sausage, bologna, salami, fatback, hot dogs, bratwurst and packaged luncheon meats.  Dairy Whole or 2% milk, cream, half-and-half, and cream cheese. Whole-fat or sweetened yogurt. Full-fat cheeses or blue cheese. Non-dairy creamers and whipped toppings. Processed cheese, cheese spreads, or cheese curds.  Condiments Onion and garlic salt, seasoned salt, table salt, and sea salt. Canned and packaged gravies. Worcestershire sauce. Tartar sauce. Barbecue sauce. Teriyaki sauce. Soy sauce, including reduced sodium. Steak sauce. Fish sauce. Oyster sauce. Cocktail sauce. Horseradish. Ketchup and mustard. Meat flavorings and tenderizers.  Bouillon cubes. Hot sauce. Tabasco sauce. Marinades. Taco seasonings. Relishes.  Fats and Oils Butter, stick margarine, lard, shortening and bacon fat. Coconut, palm kernel, or palm oils. Regular salad dressings.  Pickles and olives. Salted popcorn and pretzels.  The items listed above may not be a complete list of foods and beverages to avoid.

## 2019-09-24 NOTE — Progress Notes (Signed)
3 Month Follow Up   Assessment and Plan:   John [name] was seen today for follow-up.  Diagnoses and all orders for this visit:  Essential hypertension Continue current medications: Monitor blood pressure at home; call if consistently over 130/80 Continue DASH diet.   Reminder to go to the ER if any CP, SOB, nausea, dizziness, severe HA, changes vision/speech, left arm numbness and tingling and jaw pain.  -     CBC with Diff -     COMPLETE METABOLIC PANEL WITH GFR -     Magnesium  Autonomic postural hypotension Taking Florinef 0.1mg  BID Continue to monitor blood pressure  Mixed hyperlipidemia Continue medications: zocor 80mg  Follows with cardiology Discussed dietary and exercise modifications Low fat diet -     Lipid Profile  Gastroesophageal reflux disease with esophagitis without hemorrhage Doing well at this time Continue: Protonix 40mg  daily Diet discussed Monitor for triggers Avoid food with high acid content Avoid excessive cafeine Increase water intake  CKD stage G3a/A1, GFR 45-59 and albumin creatinine ratio <30 mg/g Continue glucose monitoring Continue medications Increase fluids Avoid NSAIDS Discussed dietary modifications and exercise Will continue to monitor  Type 2 diabetes mellitus with stage 3a chronic kidney disease, without long-term current use of insulin (HCC) Continue medications: Discussed general issues about diabetes pathophysiology and management. Education: Reviewed 'ABCs' of diabetes management (respective goals in parentheses):  A1C (<7), blood pressure (<130/80), and cholesterol (LDL <70) Dietary recommendations Encouraged aerobic exercise.  Discussed foot care, check daily Yearly retinal exam Dental exam every 6 months Monitor blood glucose, discussed goal for patient -     Hemoglobin A1c ([facility])  Vitamin D deficiency Continue supplementation Taking Vitamin D 8,000 IU daily -     Vitamin D (25 hydroxy)  Chronic anxiety Doing  well on current regiment Continue alpraxolam at bedtime Discussed stress management techniques   Discussed good sleep hygiene Discussed increasing physical activity and exercise Increase water intake  S/P left knee arthroscopy Follows with ortho Doing well at this time Continue physical therapy exercise recommendations Taking Plavix and basa  History of colonic polyps Screening UTD  Chronic obstructive pulmonary disease, unspecified COPD type ([place]) Doing well at this time No daily maintenance medications/no flares Will continue to monitor  History of TIA (transient ischemic attack) Control B/P, cholesterol and glucose  Diabetic peripheral neuropathy (HCC) Doing well at this time  Insomnia, unspecified type Taking Melatonin 10mg  nightly  Also take seroquel 25mg  nightly  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 Further disposition pending results of labs. Discussed med's effects and SE's.  Patient agrees with plan of care and opportunity to ask questions/voice concerns. Over 30 minutes of chart review, face to face interview, exam, counseling, and critical decision making was performed.   Future Appointments  Date Time Provider [place]  01/15/2020 10:00 AM Unk Pinto, MD [facility] None  04/21/2020  2:00 PM Vicie Mutters, PA-C [facility] None    ----------------------------------------------------------------------------------------------------------------------  HPI 84 y.o. male  presents for 3 month follow up on HTN w/ postural hypotension, ASCAD s/p CABG, HLD, DMII with CKD3a  which is  resolving r/t weight loss, GERD and vitamin D deficiency.  He also has history of recurrent multiple kidney stones and colonic polyps.   hx of CAD s/p CABG, TIA, T2DM presents for evaluation of swollen and painful extremity. He notably recently underwent L knee arthroscopy by Dr. Erlinda Hong on 08/24/2019 for acute medial meniscal tear.  Reports he check his blood pressure every ponce in a while at home and it is in the normal range.  He check this standing up related to postural hypotension.  Denies any dizziness, syncopal episodes, falls, palpitations, chest pains or shortness of breath.  He manages DMII with diet and activity. If he feels bad he checks his blood sugar.  Reports this will happen if he skips breakfast.  BMI is Body mass index is 23.04 kg/m., he has been working on diet and exercise. Wt Readings from Last 3 Encounters:  09/28/19 166 lb 6.4 oz (75.5 kg)  09/26/19 165 lb (74.8 kg)  09/25/19 165 lb 3.2 oz (74.9 kg)     HTN predates 1960's His blood pressure has been controlled at home, today their BP is BP: (!) 142/80  He does workout. He denies any cardiac symptoms, chest pains, palpitations, shortness of breath, dizziness or lower extremity edema.     He is not on cholesterol medication and controlled with diet and medications His cholesterol is at goal. The cholesterol last visit was:   Lab Results  Component Value Date   CHOL 165 06/26/2019   HDL 60 06/26/2019   LDLCALC 84 06/26/2019   TRIG 115 06/26/2019   CHOLHDL 2.8 06/26/2019    He has been working on diet and exercise, and denies nausea, paresthesia of the feet, polydipsia, polyuria, visual disturbances, vomiting and weight loss. Metformin was D/c'd in 2008 and last A1C in the office was:  Lab Results  Component Value Date   HGBA1C 5.5 06/26/2019   Patient is on Vitamin D supplement for deficiency first noted (18 / 2008).  He was not to goal last visit.   Lab Results  Component Value Date   VD25OH 59  06/26/2019       Current Medications:  Current Outpatient Medications on File Prior to Visit  Medication Sig  . ALPRAZolam (XANAX) 1 MG tablet Take 1/2 to 1 tablet at Bedtime ONLY if needed for Sleep and please try to limit to 5 days /week to avoid addiction  . Cholecalciferol (VITAMIN D3) 2000 UNITS capsule Take 8,000 Units by mouth daily.   [name] escitalopram (LEXAPRO) 10 MG tablet Take 1 tablet Daily for Anxiety  . finasteride (PROSCAR) 5 MG tablet Take 1 tablet daily for prostate  . fludrocortisone (FLORINEF) 0.1 MG tablet Take 1 tablet 2 x /day for Low BP  . glucose blood (FREESTYLE LITE) test strip TEST BLOOD SUGAR ONCE DAILY  . Lancets (FREESTYLE) lancets 1 each by Other route as needed for other. Use as instructed  . Melatonin 5 MG CAPS Take 10 mg by mouth at bedtime.   . nitroGLYCERIN (NITROSTAT) 0.4 MG SL tablet Sig: 1 tablet under tongue every 3 to 5 minutes as needed for Angina - Please Dispense # 2 bottles of # 25 tabs  . ondansetron (ZOFRAN) 4 MG tablet Take 1-2 tablets (4-8 mg total) by mouth every 8 (eight) hours as needed for nausea or vomiting.  . pantoprazole (PROTONIX) 40 MG tablet TAKE ONE TABLET BY MOUTH ONCE DAILY  . QUEtiapine (SEROQUEL) 25 MG tablet Take 1 tablet 3 x /day  . silodosin (RAPAFLO)  8 MG CAPS capsule Take 1 capsule at Bedtime for Prostate   No current facility-administered medications on file prior to visit.     Allergies:  Allergies  Allergen Reactions  . Gabapentin Other (See Comments)    Severe confusion  . Prozac [Fluoxetine Hcl] Other (See Comments)    Patient state that it does not agree with him  . Soma [Carisoprodol] Other (See Comments)    Patient stated that it does not agree with him  . Beta Adrenergic Blockers Other (See Comments)    REACTION: Bradycardia     Medical History:  Past Medical History:  Diagnosis Date  . Anxiety   . Arthritis    "lower back; left ankle" (03/20/2014)  . ASTHMA   . Blood transfusion    "related to  OHS, ulcers"  . CAD (coronary artery disease)    a. s/p 2V CABG 1996. b. Last cath 12/2016 -> patent grafts, med rx.  . CAD, ARTERY BYPASS GRAFT   . Cataract   . CKD (chronic kidney disease), stage II   . Colon polyps    adenomatous polyps  . COPD   . Depression   . Diabetes mellitus without complication (HCC)    no meds now  . Diverticulosis   . Duodenal ulcer without hemorrhage or perforation 12/29/2011  . GASTROESOPHAGEAL REFLUX DISEASE   . HYPERLIPIDEMIA   . Insomnia   . 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 Memory loss   . Nephrolithiasis    "I've had over 320 kidney stones" (03/20/2014)  . NEPHROLITHIASIS, HX OF 03/12/2009  . Parkinsonism ([place])   . Sleep apnea    does not use CPAP  . Stroke [facility])    TIA  . SUPRAVENTRICULAR TACHYCARDIA, HX OF   . SVT (supraventricular tachycardia) ([place]) 03/12/2009  . TIA   . Ulcer     Family history- Reviewed and unchanged   Social history- Reviewed and unchanged   Names of Other Physician/Practitioners you currently use: 1. [facility] here for primary care   Patient Care Team: Unk Pinto, MD as PCP - General Angelena Form Annita Brod, MD as PCP - Cardiology (Cardiology) Burnell Blanks, MD as Consulting Physician (Cardiology) Inda Castle, MD (Inactive) as Consulting Physician (Gastroenterology) Jannette Spanner, MD as Referring Physician (Dermatology)   Screening Tests: Immunization History  Administered Date(s) Administered  . Influenza, High Dose Seasonal PF 08/13/2013, 08/05/2014, 07/23/2015, 06/22/2016, 07/18/2017, 07/17/2018, 08/02/2019  . Pneumococcal Conjugate-13 08/27/2014  . Pneumococcal Polysaccharide-23 08/06/2012  . Tdap 08/01/2013  . Zoster 05/02/2006  . Zoster Recombinat (Shingrix) 06/26/2019     Vaccinations: TD or Tdap: 07/2013  Influenza: 07/2019  Pneumococcal: 2013 Prevnar13: 2015 Shingles: Zostavax 2007 Shingrix: 06/2019 second due in  09/2019  Preventative Care: Last colonoscopy: 03/2019    Review of Systems:  Review of Systems  Constitutional: Negative for chills, diaphoresis, fever, malaise/fatigue and weight loss.  HENT: Negative for congestion, ear discharge, ear pain, hearing loss, nosebleeds, sinus pain, sore throat and tinnitus.   Eyes: Negative for blurred vision, double vision, photophobia, pain, discharge and redness.  Respiratory: Negative for cough, hemoptysis, sputum production, shortness of breath, wheezing and stridor.   Cardiovascular: Negative for chest pain, palpitations, orthopnea, claudication, leg swelling and PND.  Gastrointestinal: Negative for abdominal pain, blood in stool, constipation, diarrhea, heartburn, melena, nausea and vomiting.  Genitourinary: Negative for dysuria, flank pain, frequency, hematuria and urgency.  Musculoskeletal: Positive for joint pain. Negative for back pain, falls, myalgias and neck pain.  Left knee, s/p arthoscopy  Skin: Negative for itching and rash.  Neurological: Negative for dizziness, tingling, tremors, sensory change, speech change, focal weakness, seizures, loss of consciousness, weakness and headaches.  Endo/Heme/Allergies: Negative for environmental allergies and polydipsia. Does not bruise/bleed easily.  Psychiatric/Behavioral: Negative for depression, hallucinations, memory loss, substance abuse and suicidal ideas. The patient has insomnia. The patient is not nervous/anxious.       Physical Exam: BP (!) 142/80   Pulse 68   Temp 97.7 F (36.5 C)   Wt 165 lb 3.2 oz (74.9 kg)   SpO2 99%   BMI 23.04 kg/m  Wt Readings from Last 3 Encounters:  09/28/19 166 lb 6.4 oz (75.5 kg)  09/26/19 165 lb (74.8 kg)  09/25/19 165 lb 3.2 oz (74.9 kg)   General Appearance: Well nourished, in no apparent distress. Eyes: PERRLA, EOMs, conjunctiva no swelling or erythema Sinuses: No Frontal/maxillary tenderness ENT/Mouth: Ext aud canals clear, TMs without  erythema, bulging. No erythema, swelling, or exudate on post pharynx.  Tonsils not swollen or erythematous. Hearing normal.  Neck: Supple, thyroid normal.  Respiratory: Respiratory effort normal, BS equal bilaterally without rales, rhonchi, wheezing or stridor.  Cardio: RRR with no MRGs. Brisk peripheral pulses without edema.  Abdomen: Soft, + BS.  Non tender, no guarding, rebound, hernias, masses. Lymphatics: Non tender without lymphadenopathy.  Musculoskeletal: Full ROM, 5/5 strength, Normal gait Skin: Warm, dry without rashes, lesions, ecchymosis. Arthroscopic incision noted to left knee, well approximated free of any erythema. Neuro: Cranial nerves intact. No cerebellar symptoms.  Psych: Awake and oriented X 3, normal affect, Insight and Judgment appropriate.    Garnet Sierras, NP [facility] 2:38 PM

## 2019-09-25 ENCOUNTER — Encounter: Payer: Self-pay | Admitting: Adult Health Nurse Practitioner

## 2019-09-25 ENCOUNTER — Ambulatory Visit (INDEPENDENT_AMBULATORY_CARE_PROVIDER_SITE_OTHER): Payer: PPO | Admitting: Adult Health Nurse Practitioner

## 2019-09-25 ENCOUNTER — Other Ambulatory Visit: Payer: Self-pay

## 2019-09-25 VITALS — BP 142/80 | HR 68 | Temp 97.7°F | Wt 165.2 lb

## 2019-09-25 DIAGNOSIS — Z860100 Personal history of colon polyps, unspecified: Secondary | ICD-10-CM

## 2019-09-25 DIAGNOSIS — E782 Mixed hyperlipidemia: Secondary | ICD-10-CM

## 2019-09-25 DIAGNOSIS — E1142 Type 2 diabetes mellitus with diabetic polyneuropathy: Secondary | ICD-10-CM | POA: Diagnosis not present

## 2019-09-25 DIAGNOSIS — I951 Orthostatic hypotension: Secondary | ICD-10-CM | POA: Diagnosis not present

## 2019-09-25 DIAGNOSIS — G4700 Insomnia, unspecified: Secondary | ICD-10-CM | POA: Diagnosis not present

## 2019-09-25 DIAGNOSIS — I10 Essential (primary) hypertension: Secondary | ICD-10-CM | POA: Diagnosis not present

## 2019-09-25 DIAGNOSIS — K2100 Gastro-esophageal reflux disease with esophagitis, without bleeding: Secondary | ICD-10-CM | POA: Diagnosis not present

## 2019-09-25 DIAGNOSIS — E538 Deficiency of other specified B group vitamins: Secondary | ICD-10-CM | POA: Diagnosis not present

## 2019-09-25 DIAGNOSIS — Z9889 Other specified postprocedural states: Secondary | ICD-10-CM

## 2019-09-25 DIAGNOSIS — E559 Vitamin D deficiency, unspecified: Secondary | ICD-10-CM

## 2019-09-25 DIAGNOSIS — Z8673 Personal history of transient ischemic attack (TIA), and cerebral infarction without residual deficits: Secondary | ICD-10-CM

## 2019-09-25 DIAGNOSIS — E1122 Type 2 diabetes mellitus with diabetic chronic kidney disease: Secondary | ICD-10-CM

## 2019-09-25 DIAGNOSIS — I25709 Atherosclerosis of coronary artery bypass graft(s), unspecified, with unspecified angina pectoris: Secondary | ICD-10-CM

## 2019-09-25 DIAGNOSIS — H9203 Otalgia, bilateral: Secondary | ICD-10-CM

## 2019-09-25 DIAGNOSIS — F419 Anxiety disorder, unspecified: Secondary | ICD-10-CM | POA: Diagnosis not present

## 2019-09-25 DIAGNOSIS — E2740 Unspecified adrenocortical insufficiency: Secondary | ICD-10-CM | POA: Insufficient documentation

## 2019-09-25 DIAGNOSIS — J45909 Unspecified asthma, uncomplicated: Secondary | ICD-10-CM

## 2019-09-25 DIAGNOSIS — N1831 Chronic kidney disease, stage 3a: Secondary | ICD-10-CM | POA: Diagnosis not present

## 2019-09-25 DIAGNOSIS — G214 Vascular parkinsonism: Secondary | ICD-10-CM | POA: Insufficient documentation

## 2019-09-25 DIAGNOSIS — E1121 Type 2 diabetes mellitus with diabetic nephropathy: Secondary | ICD-10-CM | POA: Diagnosis not present

## 2019-09-25 DIAGNOSIS — J449 Chronic obstructive pulmonary disease, unspecified: Secondary | ICD-10-CM

## 2019-09-25 MED ORDER — CLOPIDOGREL BISULFATE 75 MG PO TABS: 75.0000 mg | ORAL_TABLET | Freq: Every day | ORAL | 3 refills | Status: DC

## 2019-09-26 ENCOUNTER — Telehealth: Payer: Self-pay

## 2019-09-26 ENCOUNTER — Encounter: Payer: Self-pay | Admitting: Orthopaedic Surgery

## 2019-09-26 ENCOUNTER — Telehealth: Payer: Self-pay | Admitting: Radiology

## 2019-09-26 ENCOUNTER — Ambulatory Visit (INDEPENDENT_AMBULATORY_CARE_PROVIDER_SITE_OTHER): Payer: PPO | Admitting: Orthopaedic Surgery

## 2019-09-26 VITALS — Ht 71.0 in | Wt 165.0 lb

## 2019-09-26 DIAGNOSIS — Z9889 Other specified postprocedural states: Secondary | ICD-10-CM | POA: Insufficient documentation

## 2019-09-26 DIAGNOSIS — M1712 Unilateral primary osteoarthritis, left knee: Secondary | ICD-10-CM | POA: Insufficient documentation

## 2019-09-26 LAB — CBC WITH DIFFERENTIAL/PLATELET
Absolute Monocytes: 148 cells/uL — ABNORMAL LOW (ref 200–950)
Basophils Absolute: 20 cells/uL (ref 0–200)
Basophils Relative: 1.1 %
Eosinophils Absolute: 68 cells/uL (ref 15–500)
Eosinophils Relative: 3.8 %
HCT: 41 % (ref 38.5–50.0)
Hemoglobin: 14.1 g/dL (ref 13.2–17.1)
Lymphs Abs: 504 cells/uL — ABNORMAL LOW (ref 850–3900)
MCH: 32.9 pg (ref 27.0–33.0)
MCHC: 34.4 g/dL (ref 32.0–36.0)
MCV: 95.6 fL (ref 80.0–100.0)
MPV: 11 fL (ref 7.5–12.5)
Monocytes Relative: 8.2 %
Neutro Abs: 1060 cells/uL — ABNORMAL LOW (ref 1500–7800)
Neutrophils Relative %: 58.9 %
Platelets: 175 10*3/uL (ref 140–400)
RBC: 4.29 10*6/uL (ref 4.20–5.80)
RDW: 13.1 % (ref 11.0–15.0)
Total Lymphocyte: 28 %
WBC: 1.8 10*3/uL — ABNORMAL LOW (ref 3.8–10.8)

## 2019-09-26 LAB — COMPLETE METABOLIC PANEL WITH GFR
AG Ratio: 1.5 (calc) (ref 1.0–2.5)
ALT: 21 U/L (ref 9–46)
AST: 21 U/L (ref 10–35)
Albumin: 3.8 g/dL (ref 3.6–5.1)
Alkaline phosphatase (APISO): 63 U/L (ref 35–144)
BUN: 13 mg/dL (ref 7–25)
CO2: 29 mmol/L (ref 20–32)
Calcium: 8.7 mg/dL (ref 8.6–10.3)
Chloride: 110 mmol/L (ref 98–110)
Creat: 1.08 mg/dL (ref 0.70–1.11)
GFR, Est African American: 74 mL/min/{1.73_m2} (ref >=60)
GFR, Est Non African American: 64 mL/min/{1.73_m2} (ref >=60)
Globulin: 2.5 g/dL (calc) (ref 1.9–3.7)
Glucose, Bld: 86 mg/dL (ref 65–99)
Potassium: 4.3 mmol/L (ref 3.5–5.3)
Sodium: 147 mmol/L — ABNORMAL HIGH (ref 135–146)
Total Bilirubin: 0.4 mg/dL (ref 0.2–1.2)
Total Protein: 6.3 g/dL (ref 6.1–8.1)

## 2019-09-26 LAB — HEMOGLOBIN A1C
Hgb A1c MFr Bld: 5.2 % of total Hgb (ref <5.7)
Mean Plasma Glucose: 103 (calc)
eAG (mmol/L): 5.7 (calc)

## 2019-09-26 LAB — VITAMIN B12: Vitamin B-12: 326 pg/mL (ref 200–1100)

## 2019-09-26 LAB — LIPID PANEL
Cholesterol: 171 mg/dL (ref <200)
HDL: 64 mg/dL (ref >=40)
LDL Cholesterol (Calc): 88 mg/dL (calc)
Non-HDL Cholesterol (Calc): 107 mg/dL (calc) (ref <130)
Total CHOL/HDL Ratio: 2.7 (calc) (ref <5.0)
Triglycerides: 98 mg/dL (ref <150)

## 2019-09-26 LAB — MAGNESIUM: Magnesium: 1.9 mg/dL (ref 1.5–2.5)

## 2019-09-26 LAB — VITAMIN D 25 HYDROXY (VIT D DEFICIENCY, FRACTURES): Vit D, 25-Hydroxy: 59 ng/mL (ref 30–100)

## 2019-09-26 MED ORDER — OXYCODONE-ACETAMINOPHEN 5-325 MG PO TABS: 1.0000 | ORAL_TABLET | Freq: Every day | ORAL | 0 refills | Status: DC | PRN

## 2019-09-26 NOTE — Progress Notes (Signed)
Post-Op Visit Note   Patient: John [name]           Date of Birth: [birth year]           MRN: [id number] Visit Date: 09/26/2019 PCP: Unk Pinto, MD   Assessment & Plan:  Chief Complaint:  Chief Complaint  Patient presents with  . Left Knee - Follow-up    08/24/2019 Left Knee Arthroscopy with PMM   Visit Diagnoses:  1. S/P left knee arthroscopy   2. Primary osteoarthritis of left knee     Plan: Yakov is 1 month status post left knee arthroscopy and partial medial meniscectomy.  He is doing home exercises.  He states that he has occasional sharp pain around his patella.  The swelling has significantly improved.  He does end up having to take some Percocets occasionally.  Physical exam shows fully healed surgical scars.  He has no joint effusion.  Positive patellofemoral crepitus.  Because her cruciates are stable.  No joint line tenderness.  At this point is pain is likely due to his underlying DJD and I have recommended Synvisc injection for this.  He has had prior cortisone injection which did not give him much relief.  I did give him a small refill of Percocet.  We will have the patient come back for the Synvisc injection  Follow-Up Instructions: Return if symptoms worsen or fail to improve.   Orders:  No orders of the defined types were placed in this encounter.  Meds ordered this encounter  Medications  . oxyCODONE-acetaminophen (PERCOCET) 5-325 MG tablet    Sig: Take 1-2 tablets by mouth daily as needed for severe pain.    Dispense:  20 tablet    Refill:  0    Imaging: No results found.  PMFS History: Patient Active Problem List   Diagnosis Date Noted  . Primary osteoarthritis of left knee 09/26/2019  . S/P left knee arthroscopy 09/26/2019  . 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 Adrenal insufficiency ([place]) 09/25/2019  . Acute medial meniscus tear of left knee 08/09/2019  . Insomnia 02/19/2018  . Diabetic peripheral neuropathy ([place])  11/21/2017  . BMI 21.0-21.9, adult 09/04/2015  . Esophageal reflux 07/09/2015  . Autonomic postural hypotension 02/06/2015  . CKD stage G3a/A1, GFR 45-59 and albumin creatinine ratio <30 mg/g 03/20/2014  . Medication management 02/06/2014  . Vitamin D deficiency 02/06/2014  . Gastric AV malformation 10/30/2012  . History of colonic polyps 09/14/2011  . Mixed hyperlipidemia 03/12/2009  . Essential hypertension 03/12/2009  . 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 History of TIA (transient ischemic attack) 03/12/2009  . Asthma 03/12/2009  . COPD (chronic obstructive pulmonary disease) ([place]) 03/12/2009  . GERD 03/12/2009   Past Medical History:  Diagnosis Date  . Anxiety   . Arthritis    "lower back; left ankle" (03/20/2014)  . ASTHMA   . Blood transfusion    "related to OHS, ulcers"  . CAD (coronary artery disease)    a. s/p 2V CABG 1996. b. Last cath 12/2016 -> patent grafts, med rx.  . CAD, ARTERY BYPASS GRAFT   . Cataract   . CKD (chronic kidney disease), stage II   . Colon polyps    adenomatous polyps  . COPD   . Depression   . Diabetes mellitus without complication (HCC)    no meds now  . Diverticulosis   . Duodenal ulcer without hemorrhage or perforation 12/29/2011  . GASTROESOPHAGEAL  REFLUX DISEASE   . HYPERLIPIDEMIA   . Insomnia   . 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 Memory loss   . Nephrolithiasis    "I've had over 320 kidney stones" (03/20/2014)  . NEPHROLITHIASIS, HX OF 03/12/2009  . Parkinsonism ([place])   . Sleep apnea    does not use CPAP  . Stroke [facility])    TIA  . SUPRAVENTRICULAR TACHYCARDIA, HX OF   . SVT (supraventricular tachycardia) ([place]) 03/12/2009  . TIA   . Ulcer     Family History  Problem Relation Age of Onset  . Alcohol abuse Father   . Stroke Mother   . Heart disease Brother   . Diabetes Brother   . Heart disease Paternal Grandmother   . Colon cancer Neg Hx   . Stomach cancer Neg Hx    . Esophageal cancer Neg Hx   . Rectal cancer Neg Hx     Past Surgical History:  Procedure Laterality Date  . ANKLE FRACTURE SURGERY Left ~ 2012  . CARDIAC CATHETERIZATION    . COLONOSCOPY    . CORONARY ARTERY BYPASS GRAFT  1996   CABG X2  . CYSTOSCOPY WITH URETEROSCOPY, STONE BASKETRY AND STENT PLACEMENT    . Cystourethroscopy with stent removal.    . EXCISIONAL HEMORRHOIDECTOMY    . GASTRECTOMY    . HIATAL HERNIA REPAIR    . IRRIGATION AND DEBRIDEMENT SEBACEOUS CYST     "off my back  . KNEE ARTHROSCOPY WITH MEDIAL MENISECTOMY Left 08/24/2019   Procedure: LEFT KNEE ARTHROSCOPY WITH PARTIAL MEDIAL MENISCECTOMY;  Surgeon: Leandrew Koyanagi, MD;  Location: [facility];  Service: Orthopedics;  Laterality: Left;  . LEFT HEART CATH AND CORS/GRAFTS ANGIOGRAPHY N/A 12/23/2016   Procedure: Left Heart Cath and Cors/Grafts Angiography;  Surgeon: Burnell Blanks, MD;  Location: [facility];  Service: Cardiovascular;  Laterality: N/A;  . POLYPECTOMY    . PROSTATE SURGERY     "took the center of my prostate out"  . UPPER GASTROINTESTINAL ENDOSCOPY    . VAGOTOMY     Social History   Occupational History  . 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 Smoking status: Former Smoker    Packs/day: 0.50    Types: Cigarettes    Quit date: 10/25/1962    Years since quitting: 56.9  . Smokeless tobacco: Former Systems developer    Types: [place] date: 04/15/2018  Substance and Sexual Activity  . Alcohol use: No    Alcohol/week: 0.0 standard drinks  . Drug use: No  . Sexual activity: Yes

## 2019-09-26 NOTE — Telephone Encounter (Signed)
Please obtain approval for left knee synvisc injection.

## 2019-09-26 NOTE — Telephone Encounter (Signed)
Patient called concerning which pharmacy Rx for Percocet had been sent to.  Advised patient that Rx was sent to [facility] on [address].

## 2019-09-27 NOTE — Progress Notes (Signed)
Chief Complaint  Patient presents with  . Follow-up    CAD   History of Present Illness: 84 yo male with history of CAD s/p 2 V CABG 1996 , DM, HLD, TIA and SVT here today for cardiac follow up. Echo November 2014 with normal LV systolic function, mild AI. Carotid dopplers December 2019 with mild bilateral disease. He was admitted to [facility] in November 2016 and diagnosed with Parkinson's disease. Last cardiac cath in March 2018 in the setting of chest pain and this showed 70% proximal LAD stenosis with patent LIMA graft to LAD and Diagonal. Mild disease in RCA. The Circumflex was normal. LVEF was normal. Cardiac monitor September 2018 with sinus rhythm, PACs and blocked PACs. Most recent echo November 2020 with LVEF=50-55%, mild LVH. Grade 1 diastolic dysfunction. Mild to moderate AI.   He is here today for follow up. The patient denies any chest pain, dyspnea, palpitations, lower extremity edema, orthopnea, PND, dizziness, near syncope or syncope.   Primary Care Physician: Unk Pinto, MD  Past Medical History:  Diagnosis Date  . Anxiety   . Arthritis    "lower back; left ankle" (03/20/2014)  . ASTHMA   . Blood transfusion    "related to OHS, ulcers"  . CAD (coronary artery disease)    a. s/p 2V CABG 1996. b. Last cath 12/2016 -> patent grafts, med rx.  . CAD, ARTERY BYPASS GRAFT   . Cataract   . CKD (chronic kidney disease), stage II   . Colon polyps    adenomatous polyps  . COPD   . Depression   . Diabetes mellitus without complication (HCC)    no meds now  . Diverticulosis   . Duodenal ulcer without hemorrhage or perforation 12/29/2011  . GASTROESOPHAGEAL REFLUX DISEASE   . HYPERLIPIDEMIA   . Insomnia   . 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 Memory loss   . Nephrolithiasis    "I've had over 320 kidney stones" (03/20/2014)  . NEPHROLITHIASIS, HX OF 03/12/2009  . Parkinsonism ([place])   . Sleep apnea    does not use CPAP  . Stroke [facility])    TIA  . SUPRAVENTRICULAR TACHYCARDIA, HX OF    . SVT (supraventricular tachycardia) ([place]) 03/12/2009  . TIA   . Ulcer     Past Surgical History:  Procedure Laterality Date  . ANKLE FRACTURE SURGERY Left ~ 2012  . CARDIAC CATHETERIZATION    . COLONOSCOPY    . CORONARY ARTERY BYPASS GRAFT  1996   CABG X2  . CYSTOSCOPY WITH URETEROSCOPY, STONE BASKETRY AND STENT PLACEMENT    . Cystourethroscopy with stent removal.    . EXCISIONAL HEMORRHOIDECTOMY    . GASTRECTOMY    . HIATAL HERNIA REPAIR    . IRRIGATION AND DEBRIDEMENT SEBACEOUS CYST     "off my back  . KNEE ARTHROSCOPY WITH MEDIAL MENISECTOMY Left 08/24/2019   Procedure: LEFT KNEE ARTHROSCOPY WITH PARTIAL MEDIAL MENISCECTOMY;  Surgeon: Leandrew Koyanagi, MD;  Location: [place];  Service: Orthopedics;  Laterality: Left;  . LEFT HEART CATH AND CORS/GRAFTS ANGIOGRAPHY N/A 12/23/2016   Procedure: Left Heart Cath and Cors/Grafts Angiography;  Surgeon: Burnell Blanks, MD;  Location: [place] CV LAB;  Service: Cardiovascular;  Laterality: N/A;  . POLYPECTOMY    . PROSTATE SURGERY     "took the center of my prostate out"  . UPPER GASTROINTESTINAL ENDOSCOPY    . VAGOTOMY      Current Outpatient Medications  Medication Sig Dispense Refill  .  ALPRAZolam (XANAX) 1 MG tablet Take 1/2 to 1 tablet at Bedtime ONLY if needed for Sleep and please try to limit to 5 days /week to avoid addiction 90 tablet 0  . Cholecalciferol (VITAMIN D3) 2000 UNITS capsule Take 8,000 Units by mouth daily.     . clopidogrel (PLAVIX) 75 MG tablet Take 1 tablet (75 mg total) by mouth daily. 90 tablet 3  . escitalopram (LEXAPRO) 10 MG tablet Take 1 tablet Daily for Anxiety 90 tablet 3  . finasteride (PROSCAR) 5 MG tablet Take 1 tablet daily for prostate 90 tablet 3  . fludrocortisone (FLORINEF) 0.1 MG tablet Take 1 tablet 2 x /day for Low BP 180 tablet 3  . glucose blood (FREESTYLE LITE) test strip TEST BLOOD SUGAR ONCE DAILY 100 strip 3  . Lancets (FREESTYLE) lancets 1 each by Other route  as needed for other. Use as instructed    . Melatonin 5 MG CAPS Take 10 mg by mouth at bedtime.     . mirtazapine (REMERON SOL-TAB) 30 MG disintegrating tablet TAKE 1 TABLET ONE HOUR BEFORE BEDTIME FOR APPETITE AND SLEEP    . nitroGLYCERIN (NITROSTAT) 0.4 MG SL tablet Sig: 1 tablet under tongue every 3 to 5 minutes as needed for Angina - Please Dispense # 2 bottles of # 25 tabs 50 tablet 6  . ondansetron (ZOFRAN) 4 MG tablet Take 1-2 tablets (4-8 mg total) by mouth every 8 (eight) hours as needed for nausea or vomiting. 40 tablet 0  . oxyCODONE-acetaminophen (PERCOCET) 5-325 MG tablet Take 1-2 tablets by mouth daily as needed for severe pain. 20 tablet 0  . pantoprazole (PROTONIX) 40 MG tablet TAKE ONE TABLET BY MOUTH ONCE DAILY 90 tablet 3  . potassium citrate (UROCIT-K) 10 MEQ (1080 MG) SR tablet TAKE ONE TABLET BY MOUTH TWICE DAILY 180 tablet 1  . QUEtiapine (SEROQUEL) 25 MG tablet Take 1 tablet 3 x /day 270 tablet 3  . silodosin (RAPAFLO) 8 MG CAPS capsule Take 1 capsule at Bedtime for Prostate 90 capsule 3  . simvastatin (ZOCOR) 80 MG tablet TAKE ONE TABLET BY MOUTH AT BEDTIME 90 tablet 1  . vitamin C (ASCORBIC ACID) 500 MG tablet Take 500 mg by mouth daily.    [name] zinc gluconate 50 MG tablet Take 50 mg by mouth every other day.     No current facility-administered medications for this visit.     Allergies  Allergen Reactions  . Gabapentin Other (See Comments)    Severe confusion  . Prozac [Fluoxetine Hcl] Other (See Comments)    Patient state that it does not agree with him  . Soma [Carisoprodol] Other (See Comments)    Patient stated that it does not agree with him  . Beta Adrenergic Blockers Other (See Comments)    REACTION: Bradycardia    Social History   Socioeconomic History  . Marital status: Widowed    Spouse name: Not on file  . Number of children: 2  . Years of education: 49  . Highest education level: Not on file  Occupational History  . Occupation:  Programme researcher, broadcasting/film/video at [employer]: [place]  . Financial resource strain: Not on file  . Food insecurity    Worry: Not on file    Inability: Not on file  . Transportation needs    Medical: Not on file    Non-medical: Not on file  Tobacco Use  . Smoking status: Former Smoker    Packs/day: 0.50  Types: Cigarettes    Quit date: 10/25/1962    Years since quitting: 56.9  . Smokeless tobacco: Former Systems developer    Types: [place] date: 04/15/2018  Substance and Sexual Activity  . Alcohol use: No    Alcohol/week: 0.0 standard drinks  . Drug use: No  . Sexual activity: Yes  Lifestyle  . Physical activity    Days per week: Not on file    Minutes per session: Not on file  . Stress: Not on file  Relationships  . Social Herbalist on phone: Not on file    Gets together: Not on file    Attends religious service: Not on file    Active member of club or organization: Not on file    Attends meetings of clubs or organizations: Not on file    Relationship status: Not on file  . Intimate partner violence    Fear of current or ex partner: Not on file    Emotionally abused: Not on file    Physically abused: Not on file    Forced sexual activity: Not on file  Other Topics Concern  . Not on file  Social History Narrative   Denies caffeine use     Family History  Problem Relation Age of Onset  . Alcohol abuse Father   . Stroke Mother   . Heart disease Brother   . Diabetes Brother   . Heart disease Paternal Grandmother   . Colon cancer Neg Hx   . Stomach cancer Neg Hx   . Esophageal cancer Neg Hx   . Rectal cancer Neg Hx     Review of Systems:  As stated in the HPI and otherwise negative.   BP 130/72   Pulse 93   Ht 6' (1.829 m)   Wt 166 lb 6.4 oz (75.5 kg)   SpO2 95%   BMI 22.57 kg/m   Physical Examination:  General: Well developed, well nourished, NAD  HEENT: OP clear, mucus membranes moist  SKIN: warm, dry. No rashes. Neuro: No  focal deficits  Musculoskeletal: Muscle strength 5/5 all ext  Psychiatric: Mood and affect normal  Neck: No JVD, no carotid bruits, no thyromegaly, no lymphadenopathy.  Lungs:Clear bilaterally, no wheezes, rhonci, crackles Cardiovascular: Regular rate and rhythm. No murmurs, gallops or rubs. Abdomen:Soft. Bowel sounds present. Non-tender.  Extremities: No lower extremity edema. Pulses are 2 + in the bilateral DP/PT.  Cardiac cath 12/23/16:  Prox LAD to Mid LAD lesion, 70 %stenosed.  Prox RCA lesion, 30 %stenosed.  Mid RCA lesion, 20 %stenosed.  LIMA graft was visualized by angiography and is normal in caliber and anatomically normal.  The left ventricular systolic function is normal.  LV end diastolic pressure is normal.  The left ventricular ejection fraction is 55-65% by visual estimate.  There is no mitral valve regurgitation.   1. Single vessel CAD s/p 2V CABG 2. Moderately severe stenosis mid LAD with patent LIMA to LAD and Diagonal. There is antegrade flow down the LAD and competitive flow from the graft.  3. Mild disease in the RCA 4. Normal LV systolic function Coronary Diagrams   Diagnostic Diagram          Echo 09/13/19:  1. Left ventricular ejection fraction, by visual estimation, is 50 to 55%. The left ventricle has normal function. There is mildly increased left ventricular hypertrophy.  2. Left ventricular diastolic parameters are consistent with Grade I diastolic dysfunction (impaired relaxation).  3. Global right  ventricle has normal systolic function.The right ventricular size is normal.  4. Left atrial size was normal.  5. Right atrial size was normal.  6. Mild mitral annular calcification.  7. The mitral valve is normal in structure. Trace mitral valve regurgitation. No evidence of mitral stenosis.  8. The tricuspid valve is normal in structure. Tricuspid valve regurgitation is trivial.  9. The aortic valve is tricuspid. Aortic valve regurgitation is  mild. Mild aortic valve sclerosis without stenosis. 10. The pulmonic valve was normal in structure. Pulmonic valve regurgitation is mild. 11. Low normal LV systolic function; grade 1 diastolic dysfunction; mild LVH with proximal septal thickening; mild AI.  EKG:  EKG is not ordered today.  Recent Labs: 06/26/2019: TSH 1.66 09/25/2019: ALT 21; BUN 13; Creat 1.08; Hemoglobin 14.1; Magnesium 1.9; Platelets 175; Potassium 4.3; Sodium 147   Lipid Panel    Component Value Date/Time   CHOL 171 09/25/2019 1508   TRIG 98 09/25/2019 1508   HDL 64 09/25/2019 1508   CHOLHDL 2.7 09/25/2019 1508   VLDL 16 05/03/2017 1520   LDLCALC 88 09/25/2019 1508     Wt Readings from Last 3 Encounters:  09/28/19 166 lb 6.4 oz (75.5 kg)  09/26/19 165 lb (74.8 kg)  09/25/19 165 lb 3.2 oz (74.9 kg)    Other studies Reviewed: Additional studies/ records that were reviewed today include:. Review of the above records demonstrates:    Assessment and Plan:   1.CAD without angina: Cardiac cath March 2018 with stable CAD, patent LIMA to LAD/Diagonal but also antegrade flow through the LAD with 70% proximal LAD stenosis. No chest pain. Will continue Plavix and statin. He does not tolerate beta blockers due to fatigue.    2. HLD: Lipids followed in primary care. Will continue statin.    3. Carotid artery disease: Mild bilateral carotid artery disease by dopplers December 2019.   4. PACs/PVCs: No palpitations.  5. Aortic valve insufficiency: Mild to moderate by echo November 2020.    Current medicines are reviewed at length with the patient today.  The patient does not have concerns regarding medicines.  The following changes have been made:  no change  Labs/ tests ordered today include: None  No orders of the defined types were placed in this encounter.   Disposition:   FU with me in 12 months.   Signed, Lauree Chandler, MD 09/28/2019 3:06 PM    [facility] [address], [place], [place]  [address] Phone: [phone]; Fax: [phone]

## 2019-09-28 ENCOUNTER — Ambulatory Visit: Payer: PPO | Admitting: Cardiovascular Disease

## 2019-09-28 ENCOUNTER — Other Ambulatory Visit: Payer: Self-pay

## 2019-09-28 ENCOUNTER — Encounter: Payer: Self-pay | Admitting: Cardiovascular Disease

## 2019-09-28 VITALS — BP 130/72 | HR 93 | Ht 72.0 in | Wt 166.4 lb

## 2019-09-28 DIAGNOSIS — I491 Atrial premature depolarization: Secondary | ICD-10-CM | POA: Diagnosis not present

## 2019-09-28 DIAGNOSIS — I6523 Occlusion and stenosis of bilateral carotid arteries: Secondary | ICD-10-CM | POA: Diagnosis not present

## 2019-09-28 DIAGNOSIS — I351 Nonrheumatic aortic (valve) insufficiency: Secondary | ICD-10-CM | POA: Diagnosis not present

## 2019-09-28 DIAGNOSIS — E7800 Pure hypercholesterolemia, unspecified: Secondary | ICD-10-CM

## 2019-09-28 DIAGNOSIS — I2510 Atherosclerotic heart disease of native coronary artery without angina pectoris: Secondary | ICD-10-CM | POA: Diagnosis not present

## 2019-09-28 NOTE — Telephone Encounter (Signed)
Noted  

## 2019-09-28 NOTE — Patient Instructions (Signed)

## 2019-10-01 ENCOUNTER — Other Ambulatory Visit: Payer: Self-pay | Admitting: Internal Medicine

## 2019-10-01 ENCOUNTER — Telehealth: Payer: Self-pay

## 2019-10-01 DIAGNOSIS — E785 Hyperlipidemia, unspecified: Secondary | ICD-10-CM

## 2019-10-01 DIAGNOSIS — F419 Anxiety disorder, unspecified: Secondary | ICD-10-CM

## 2019-10-01 DIAGNOSIS — N200 Calculus of kidney: Secondary | ICD-10-CM

## 2019-10-01 MED ORDER — ALPRAZOLAM 1 MG PO TABS: ORAL_TABLET | ORAL | 0 refills | Status: DC

## 2019-10-01 NOTE — Telephone Encounter (Signed)
Submitted VOB for Monovisc, left knee. 

## 2019-10-03 ENCOUNTER — Telehealth: Payer: Self-pay

## 2019-10-03 NOTE — Telephone Encounter (Signed)
PA required for Monovisc, left knee. Faxed office notes to [facility] [employer] to initiate PA at [phone].

## 2019-10-05 ENCOUNTER — Telehealth: Payer: Self-pay

## 2019-10-05 NOTE — Telephone Encounter (Signed)
Received PA form [employer] to complete. Faxed completed PA form to [facility] [employer] at [phone].

## 2019-10-15 ENCOUNTER — Telehealth: Payer: Self-pay

## 2019-10-15 NOTE — Telephone Encounter (Signed)
Approved for Monovisc, left knee. [place] Patient will be responsible for 20% OOP. Co-pay of $20.00 PA required Per [employer], authorization is approved. PA Approval# [id number] Valid 10/25/2019- 01/23/2020  Appt. 10/25/2019 with [name], PA

## 2019-10-25 ENCOUNTER — Ambulatory Visit (INDEPENDENT_AMBULATORY_CARE_PROVIDER_SITE_OTHER): Payer: PPO | Admitting: Orthopaedic Surgery

## 2019-10-25 ENCOUNTER — Other Ambulatory Visit: Payer: Self-pay

## 2019-10-25 DIAGNOSIS — M1712 Unilateral primary osteoarthritis, left knee: Secondary | ICD-10-CM | POA: Diagnosis not present

## 2019-10-25 MED ORDER — BUPIVACAINE HCL 0.25 % IJ SOLN
2.0000 mL | INTRAMUSCULAR | Status: AC | PRN
  Administered 2019-10-25: 2 mL via INTRA_ARTICULAR

## 2019-10-25 MED ORDER — LIDOCAINE HCL 1 % IJ SOLN
2.0000 mL | INTRAMUSCULAR | Status: AC | PRN
  Administered 2019-10-25: 2 mL

## 2019-10-25 MED ORDER — HYDROCODONE-ACETAMINOPHEN 5-325 MG PO TABS: 1.0000 | ORAL_TABLET | Freq: Every day | ORAL | 0 refills | Status: DC | PRN

## 2019-10-25 MED ORDER — HYALURONAN 88 MG/4ML IX SOSY
88.0000 mg | PREFILLED_SYRINGE | INTRA_ARTICULAR | Status: AC | PRN
  Administered 2019-10-25: 88 mg via INTRA_ARTICULAR

## 2019-10-25 NOTE — Progress Notes (Signed)
   Procedure Note  Patient: John [name]             Date of Birth: Dec 06, 1934           MRN: [id number]             Visit Date: 10/25/2019  Procedures: Visit Diagnoses:  1. Unilateral primary osteoarthritis, left knee     Large Joint Inj: L knee on 10/25/2019 1:04 PM Indications: pain Details: 22 G needle, anterolateral approach Medications: 2 mL lidocaine 1 %; 2 mL bupivacaine 0.25 %; 88 mg Hyaluronan 88 MG/4ML

## 2019-11-12 ENCOUNTER — Other Ambulatory Visit: Payer: Self-pay | Admitting: Internal Medicine

## 2019-11-16 ENCOUNTER — Other Ambulatory Visit: Payer: Self-pay

## 2019-11-16 ENCOUNTER — Ambulatory Visit: Payer: PPO | Attending: Internal Medicine

## 2019-11-16 DIAGNOSIS — Z23 Encounter for immunization: Secondary | ICD-10-CM | POA: Insufficient documentation

## 2019-11-16 NOTE — Progress Notes (Signed)
   Covid-19 Vaccination Clinic  Name:  John [name]    MRN: [id number] DOB: 1935/02/12  11/16/2019  [name] was observed post Covid-19 immunization for 15 minutes without incidence. He was provided with Vaccine Information Sheet and instruction to access the V-Safe system.   [name] was instructed to call 911 with any severe reactions post vaccine: [name] Difficulty breathing  . Swelling of your face and throat  . A fast heartbeat  . A bad rash all over your body  . Dizziness and weakness    Immunizations Administered    Name Date Dose VIS Date Route   Pfizer COVID-19 Vaccine 11/16/2019  9:10 AM 0.3 mL 10/05/2019 Intramuscular   Manufacturer: [place]   Lot: [id number]   [place]: [id number]

## 2019-12-06 ENCOUNTER — Ambulatory Visit: Payer: PPO | Attending: Internal Medicine

## 2019-12-06 DIAGNOSIS — Z23 Encounter for immunization: Secondary | ICD-10-CM | POA: Insufficient documentation

## 2019-12-06 NOTE — Progress Notes (Signed)
   Covid-19 Vaccination Clinic  Name:  John [name]    MRN: [id number] DOB: January 08, 1935  12/06/2019  John [name] was observed post Covid-19 immunization for 15 minutes without incidence. He was provided with Vaccine Information Sheet and instruction to access the V-Safe system.   John [name] was instructed to call 911 with any severe reactions post vaccine: [name] Difficulty breathing  . Swelling of your face and throat  . A fast heartbeat  . A bad rash all over your body  . Dizziness and weakness    Immunizations Administered    Name Date Dose VIS Date Route   Pfizer COVID-19 Vaccine 12/06/2019  2:08 PM 0.3 mL 10/05/2019 Intramuscular   Manufacturer: [place]   Lot: [id number]   [place]: [id number]

## 2019-12-25 ENCOUNTER — Other Ambulatory Visit: Payer: Self-pay | Admitting: Internal Medicine

## 2019-12-25 DIAGNOSIS — F419 Anxiety disorder, unspecified: Secondary | ICD-10-CM

## 2019-12-25 MED ORDER — ALPRAZOLAM 1 MG PO TABS: ORAL_TABLET | ORAL | 0 refills | Status: DC

## 2019-12-30 ENCOUNTER — Other Ambulatory Visit: Payer: Self-pay | Admitting: Internal Medicine

## 2020-01-14 ENCOUNTER — Encounter: Payer: Self-pay | Admitting: Internal Medicine

## 2020-01-14 NOTE — Patient Instructions (Signed)

## 2020-01-14 NOTE — Progress Notes (Signed)
Annual  Screening/Preventative Visit  & Comprehensive Evaluation & Examination     This very nice 85 y.o. [place]  (widowed Nov 2019) presents for a Screening /Preventative Visit & comprehensive evaluation and management of multiple medical co-morbidities.  Patient has been followed for HTN, ASCAD/CABG, HLD, T2_NIDDM  and Vitamin D Deficiency.   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 Patient also has GERD controlled on his meds. Patient has long hx/o recurrent Kidney Stones (approximating > 200) and is followed by Dr Dutch Gray.     Patient has hx/o HTN predating circa 1960's. 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 Today's BP is at goal -122/76. In 1996, he underwent a 2 V CABG.  Patient denies any cardiac symptoms as chest pain, palpitations, shortness of breath, dizziness or ankle swelling.     Patient's hyperlipidemia is controlled with diet and medications. Patient denies myalgias or other medication SE's. Last lipids were at goal:  Lab Results  Component Value Date   CHOL 171 09/25/2019   HDL 64 09/25/2019   LDLCALC 88 09/25/2019   TRIG 98 09/25/2019   CHOLHDL 2.7 09/25/2019       Patient has hx/o T2_NIDDM circa 1995 now diet controlled and in 2008 after moderate weight loss was able to stop Metformin and A1c's havre remained Normal.  Patient denies reactive hypoglycemic symptoms, visual blurring or diabetic polys, but does have Diabetic Neuropathy with numb paresthesias.. Last A1c was Normal & at goal:  Lab Results  Component Value Date   HGBA1C 5.2 09/25/2019        Finally, patient has history of Vitamin D Deficiency ("18" / 2008) and last vitamin D was at goal:  Lab Results  Component Value Date   VD25OH 59 09/25/2019    Current Outpatient Medications on File Prior to Visit  Medication Sig  . ALPRAZolam (XANAX)  1 MG tablet Take 1/2 to 1 tablet at Bedtime ONLY if needed for Sleep and please try to limit to 5 days /week to avoid addiction  . Cholecalciferol (VITAMIN D3) 2000 UNITS capsule Take 8,000 Units by mouth daily.   . clopidogrel (PLAVIX) 75 MG tablet Take 1 tablet Daily to prevent Blood Clots  . escitalopram (LEXAPRO) 10 MG tablet Take 1 tablet Daily for Anxiety  . finasteride (PROSCAR) 5 MG tablet Take 1 tablet daily for prostate  . fludrocortisone (FLORINEF) 0.1 MG tablet Take 1 tablet 2 x /day for Low BP  . glucose blood (FREESTYLE LITE) test strip TEST BLOOD SUGAR ONCE DAILY  . Lancets (FREESTYLE) lancets 1 each by Other route as needed for other. Use as instructed  . Melatonin 5 MG CAPS Take 10 mg by mouth at bedtime.   . mirtazapine (REMERON SOL-TAB) 30 MG disintegrating tablet Take 1 tablet 1 hour before Bedtime for Appetite & Sleep  . nitroGLYCERIN (NITROSTAT) 0.4 MG SL tablet Sig: 1 tablet under tongue every 3 to 5 minutes as needed for Angina - Please Dispense # 2 bottles of # 25 tabs  . ondansetron (ZOFRAN) 4 MG tablet Take 1-2 tablets (4-8 mg total) by mouth every 8 (eight) hours as needed for nausea or vomiting.  . pantoprazole (PROTONIX) 40 MG tablet TAKE ONE TABLET BY MOUTH ONCE DAILY  . potassium citrate (UROCIT-K) 10 MEQ (1080 MG) SR tablet Take 1 tablet 2 x /day to Prevent Kidney Stones  . QUEtiapine (SEROQUEL)  25 MG tablet Take 1 tablet 3 x /day  . silodosin (RAPAFLO) 8 MG CAPS capsule Take 1 capsule at Bedtime for Prostate  . simvastatin (ZOCOR) 80 MG tablet Take 1 tablet at Bedtime for Cholesterol  . vitamin C (ASCORBIC ACID) 500 MG tablet Take 500 mg by mouth daily.  [name] zinc gluconate 50 MG tablet Take 50 mg by mouth every other day.   No current facility-administered medications on file prior to visit.   Allergies  Allergen Reactions  . Gabapentin Other (See Comments)    Severe confusion  . Prozac [Fluoxetine Hcl] Other (See Comments)    Patient state that it does not  agree with him  . Soma [Carisoprodol] Other (See Comments)    Patient stated that it does not agree with him  . Beta Adrenergic Blockers Other (See Comments)    REACTION: Bradycardia   Past Medical History:  Diagnosis Date  . Anxiety   . Arthritis    "lower back; left ankle" (03/20/2014)  . ASTHMA   . Blood transfusion    "related to OHS, ulcers"  . CAD (coronary artery disease)    a. s/p 2V CABG 1996. b. Last cath 12/2016 -> patent grafts, med rx.  . CAD, ARTERY BYPASS GRAFT   . Cataract   . CKD (chronic kidney disease), stage II   . Colon polyps    adenomatous polyps  . COPD   . Depression   . Diabetes mellitus without complication (HCC)    no meds now  . Diverticulosis   . Duodenal ulcer without hemorrhage or perforation 12/29/2011  . GASTROESOPHAGEAL REFLUX DISEASE   . HYPERLIPIDEMIA   . Insomnia   . 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 Memory loss   . Nephrolithiasis    "I've had over 320 kidney stones" (03/20/2014)  . NEPHROLITHIASIS, HX OF 03/12/2009  . Parkinsonism ([place])   . Sleep apnea    does not use CPAP  . Stroke [facility])    TIA  . SUPRAVENTRICULAR TACHYCARDIA, HX OF   . SVT (supraventricular tachycardia) ([place]) 03/12/2009  . TIA   . Ulcer    Health Maintenance  Topic Date Due  . URINE MICROALBUMIN  11/21/2018  . OPHTHALMOLOGY EXAM  09/21/2019  . HEMOGLOBIN A1C  03/25/2020  . COLONOSCOPY  04/04/2020  . FOOT EXAM  01/13/2021  . TETANUS/TDAP  08/02/2023  . INFLUENZA VACCINE  Completed  . PNA vac Low Risk Adult  Completed   Immunization History  Administered Date(s) Administered  . Influenza, High Dose Seasonal PF 08/13/2013, 08/05/2014, 07/23/2015, 06/22/2016, 07/18/2017, 07/17/2018, 08/02/2019  . PFIZER SARS-COV-2 Vaccination 11/16/2019, 12/06/2019  . Pneumococcal Conjugate-13 08/27/2014  . Pneumococcal Polysaccharide-23 08/06/2012  . Tdap 08/01/2013  . Zoster 05/02/2006  . Zoster Recombinat (Shingrix) 06/26/2019, 10/01/2019   Last Colon -   Past Surgical  History:  Procedure Laterality Date  . ANKLE FRACTURE SURGERY Left ~ 2012  . CARDIAC CATHETERIZATION    . COLONOSCOPY    . CORONARY ARTERY BYPASS GRAFT  1996   CABG X2  . CYSTOSCOPY WITH URETEROSCOPY, STONE BASKETRY AND STENT PLACEMENT    . Cystourethroscopy with stent removal.    . EXCISIONAL HEMORRHOIDECTOMY    . GASTRECTOMY    . HIATAL HERNIA REPAIR    . IRRIGATION AND DEBRIDEMENT SEBACEOUS CYST     "off my back  . KNEE ARTHROSCOPY WITH MEDIAL MENISECTOMY Left 08/24/2019   Procedure: LEFT KNEE ARTHROSCOPY WITH PARTIAL MEDIAL MENISCECTOMY;  Surgeon: Leandrew Koyanagi, MD;  Location: [facility];  Service: Orthopedics;  Laterality: Left;  . LEFT HEART CATH AND CORS/GRAFTS ANGIOGRAPHY N/A 12/23/2016   Procedure: Left Heart Cath and Cors/Grafts Angiography;  Surgeon: Burnell Blanks, MD;  Location: [facility];  Service: Cardiovascular;  Laterality: N/A;  . POLYPECTOMY    . PROSTATE SURGERY     "took the center of my prostate out"  . UPPER GASTROINTESTINAL ENDOSCOPY    . VAGOTOMY     Family History  Problem Relation Age of Onset  . Alcohol abuse Father   . Stroke Mother   . Heart disease Brother   . Diabetes Brother   . Heart disease Paternal Grandmother   . Colon cancer Neg Hx   . Stomach cancer Neg Hx   . Esophageal cancer Neg Hx   . Rectal cancer Neg Hx    Social History   Socioeconomic History  . Marital status: Widowed    Spouse name: Not on file  . Number of children: 2  . Years of education: 19  . Highest education level: Not on file  Occupational History  . 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 Smoking status: Former Smoker    Packs/day: 0.50    Types: Cigarettes    Quit date: 10/25/1962    Years since quitting: 57.2  . Smokeless tobacco: Former Systems developer    Types: [place] date: 04/15/2018  Substance and Sexual Activity  . Alcohol use: No    Alcohol/week: 0.0 standard drinks  . Drug  use: No  . Sexual activity: No   ROS Constitutional: Denies fever, chills, weight loss/gain, headaches, insomnia,  night sweats or change in appetite. Does c/o fatigue. Eyes: Denies redness, blurred vision, diplopia, discharge, itchy or watery eyes.  ENT: Denies discharge, congestion, post nasal drip, epistaxis, sore throat, earache, hearing loss, dental pain, Tinnitus, Vertigo, Sinus pain or snoring.  Cardio: Denies chest pain, palpitations, irregular heartbeat, syncope, dyspnea, diaphoresis, orthopnea, PND, claudication or edema Respiratory: denies cough, dyspnea, DOE, pleurisy, hoarseness, laryngitis or wheezing.  Gastrointestinal: Denies dysphagia, heartburn, reflux, water brash, pain, cramps, nausea, vomiting, bloating, diarrhea, constipation, hematemesis, melena, hematochezia, jaundice or hemorrhoids Genitourinary: Denies dysuria, frequency, urgency, nocturia, hesitancy, discharge, hematuria or flank pain Musculoskeletal: Denies arthralgia, myalgia, stiffness, Jt. Swelling, pain, limp or strain/sprain. Denies Falls. Skin: Denies puritis, rash, hives, warts, acne, eczema or change in skin lesion Neuro: No weakness, tremor, incoordination, spasms, paresthesia or pain Psychiatric: Denies confusion, memory loss or sensory loss. Denies Depression. Endocrine: Denies change in weight, skin, hair change, nocturia, and paresthesia, diabetic polys, visual blurring or hyper / hypo glycemic episodes.  Heme/Lymph: No excessive bleeding, bruising or enlarged lymph nodes.  Physical Exam  BP 122/76   Pulse 76   Temp (!) 97.1 F (36.2 C)   Resp 16   Ht 5\' 11"  (1.803 m)   Wt 168 lb 3.2 oz (76.3 kg)   BMI 23.46 kg/m   General Appearance: Well nourished and well groomed and in no apparent distress.  Eyes: PERRLA, EOMs, conjunctiva no swelling or erythema, normal fundi and vessels. Sinuses: No frontal/maxillary tenderness ENT/Mouth: EACs patent / TMs  nl. Nares clear without erythema, swelling,  mucoid exudates. Oral hygiene is good. No erythema, swelling, or exudate. Tongue normal, non-obstructing. Tonsils not swollen or erythematous. Hearing normal.  Neck: Supple, thyroid not palpable. No bruits, nodes or JVD. Respiratory: Respiratory effort normal.  BS equal and clear bilateral without rales, rhonci, wheezing or  stridor. Cardio: Heart sounds are normal with regular rate and rhythm and no murmurs, rubs or gallops. Peripheral pulses are normal and equal bilaterally without edema. No aortic or femoral bruits. Chest: symmetric with normal excursions and percussion.  Abdomen: Soft, with Nl bowel sounds. Nontender, no guarding, rebound, hernias, masses, or organomegaly.  Lymphatics: Non tender without lymphadenopathy.  Musculoskeletal: Full ROM all peripheral extremities, joint stability, 5/5 strength, and normal gait. Skin: Warm and dry without rashes, lesions, cyanosis, clubbing or  ecchymosis.  Neuro: Cranial nerves intact, reflexes equal bilaterally. Normal muscle tone, no cerebellar symptoms. Sensation intact.  Pysch: Alert and oriented X 3 with normal affect, insight and judgment appropriate.   Assessment and Plan  1. Annual Preventative/Screening Exam   2. Essential hypertension  - EKG 12-Lead - [place], RETROPERITNL ABD,  LTD - Urinalysis, Routine w reflex microscopic - Microalbumin / creatinine urine ratio - CBC with Differential/Platelet - COMPLETE METABOLIC PANEL WITH GFR - Magnesium - TSH  3. Autonomic postural hypotension  - EKG 12-Lead  4. Hyperlipidemia associated with type 2 diabetes mellitus ([place])  - EKG 12-Lead - [place], RETROPERITNL ABD,  LTD - Lipid panel - TSH  5. Type 2 diabetes mellitus with stage 2 chronic kidney disease,  without long-term current use of insulin (HCC)  - EKG 12-Lead - [place], RETROPERITNL ABD,  LTD - Urinalysis, Routine w reflex microscopic - Microalbumin / creatinine urine ratio - Hemoglobin A1c - Insulin, random  6. Vitamin D  deficiency  - VITAMIN D 25 Hydroxy   7. 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 Gastroesophageal reflux disease  - CBC with Differential/Platelet  9. Diabetic peripheral neuropathy (HCC)  - HM DIABETES FOOT EXAM - LOW EXTREMITY NEUR EXAM DOCUM  10. BPH with obstruction/lower urinary tract symptoms  - PSA  11. Screening for colorectal cancer  - Due f/u Colon  12. Prostate cancer screening  - PSA  13. Screening for ischemic heart disease  - EKG 12-Lead  14. FHx: heart disease  - EKG 12-Lead - [place], RETROPERITNL ABD,  LTD  15. Former smoker  - EKG 12-Lead - [place], RETROPERITNL ABD,  LTD  16. Screening for AAA (aortic abdominal aneurysm)  - [place], RETROPERITNL ABD,  LTD  17. Medication management  - Urinalysis, Routine w reflex microscopic - Microalbumin / creatinine urine ratio - CBC with Differential/Platelet - COMPLETE METABOLIC PANEL WITH GFR - Magnesium - Lipid panel - TSH - Hemoglobin A1c - Insulin, random - VITAMIN D 25 Hydroxy        Patient was counseled in prudent diet, weight control to achieve/maintain BMI less than 25, BP monitoring, regular exercise and medications as discussed.  Discussed med effects and SE's. Routine screening labs and tests as requested with regular follow-up as recommended. Over 40 minutes of exam, counseling, chart review and high complex critical decision making was performed   Kirtland Bouchard, MD

## 2020-01-15 ENCOUNTER — Ambulatory Visit (INDEPENDENT_AMBULATORY_CARE_PROVIDER_SITE_OTHER): Payer: PPO | Admitting: Internal Medicine

## 2020-01-15 ENCOUNTER — Other Ambulatory Visit: Payer: Self-pay

## 2020-01-15 VITALS — BP 122/76 | HR 76 | Temp 97.1°F | Resp 16 | Ht 71.0 in | Wt 168.2 lb

## 2020-01-15 DIAGNOSIS — F5101 Primary insomnia: Secondary | ICD-10-CM

## 2020-01-15 DIAGNOSIS — Z0000 Encounter for general adult medical examination without abnormal findings: Secondary | ICD-10-CM

## 2020-01-15 DIAGNOSIS — E785 Hyperlipidemia, unspecified: Secondary | ICD-10-CM | POA: Diagnosis not present

## 2020-01-15 DIAGNOSIS — Z125 Encounter for screening for malignant neoplasm of prostate: Secondary | ICD-10-CM

## 2020-01-15 DIAGNOSIS — E1122 Type 2 diabetes mellitus with diabetic chronic kidney disease: Secondary | ICD-10-CM

## 2020-01-15 DIAGNOSIS — I10 Essential (primary) hypertension: Secondary | ICD-10-CM

## 2020-01-15 DIAGNOSIS — N401 Enlarged prostate with lower urinary tract symptoms: Secondary | ICD-10-CM | POA: Diagnosis not present

## 2020-01-15 DIAGNOSIS — N138 Other obstructive and reflux uropathy: Secondary | ICD-10-CM

## 2020-01-15 DIAGNOSIS — N182 Chronic kidney disease, stage 2 (mild): Secondary | ICD-10-CM | POA: Diagnosis not present

## 2020-01-15 DIAGNOSIS — Z8249 Family history of ischemic heart disease and other diseases of the circulatory system: Secondary | ICD-10-CM | POA: Diagnosis not present

## 2020-01-15 DIAGNOSIS — Z87891 Personal history of nicotine dependence: Secondary | ICD-10-CM

## 2020-01-15 DIAGNOSIS — Z1211 Encounter for screening for malignant neoplasm of colon: Secondary | ICD-10-CM

## 2020-01-15 DIAGNOSIS — Z0001 Encounter for general adult medical examination with abnormal findings: Secondary | ICD-10-CM

## 2020-01-15 DIAGNOSIS — Z79899 Other long term (current) drug therapy: Secondary | ICD-10-CM | POA: Diagnosis not present

## 2020-01-15 DIAGNOSIS — K2100 Gastro-esophageal reflux disease with esophagitis, without bleeding: Secondary | ICD-10-CM

## 2020-01-15 DIAGNOSIS — I951 Orthostatic hypotension: Secondary | ICD-10-CM

## 2020-01-15 DIAGNOSIS — E559 Vitamin D deficiency, unspecified: Secondary | ICD-10-CM

## 2020-01-15 DIAGNOSIS — E1142 Type 2 diabetes mellitus with diabetic polyneuropathy: Secondary | ICD-10-CM

## 2020-01-15 DIAGNOSIS — I25709 Atherosclerosis of coronary artery bypass graft(s), unspecified, with unspecified angina pectoris: Secondary | ICD-10-CM

## 2020-01-15 DIAGNOSIS — Z136 Encounter for screening for cardiovascular disorders: Secondary | ICD-10-CM

## 2020-01-15 DIAGNOSIS — R110 Nausea: Secondary | ICD-10-CM

## 2020-01-15 DIAGNOSIS — E1169 Type 2 diabetes mellitus with other specified complication: Secondary | ICD-10-CM | POA: Diagnosis not present

## 2020-01-15 MED ORDER — ONDANSETRON HCL 8 MG PO TABS: ORAL_TABLET | ORAL | 0 refills | Status: DC

## 2020-01-15 MED ORDER — QUETIAPINE FUMARATE 100 MG PO TABS: ORAL_TABLET | ORAL | 0 refills | Status: DC

## 2020-01-16 LAB — CBC WITH DIFFERENTIAL/PLATELET
Absolute Monocytes: 360 cells/uL (ref 200–950)
Basophils Absolute: 70 cells/uL (ref 0–200)
Basophils Relative: 1.4 %
Eosinophils Absolute: 230 cells/uL (ref 15–500)
Eosinophils Relative: 4.6 %
HCT: 43.7 % (ref 38.5–50.0)
Hemoglobin: 14.6 g/dL (ref 13.2–17.1)
Lymphs Abs: 1750 cells/uL (ref 850–3900)
MCH: 31.9 pg (ref 27.0–33.0)
MCHC: 33.4 g/dL (ref 32.0–36.0)
MCV: 95.4 fL (ref 80.0–100.0)
MPV: 10.2 fL (ref 7.5–12.5)
Monocytes Relative: 7.2 %
Neutro Abs: 2590 cells/uL (ref 1500–7800)
Neutrophils Relative %: 51.8 %
Platelets: 204 10*3/uL (ref 140–400)
RBC: 4.58 10*6/uL (ref 4.20–5.80)
RDW: 13.1 % (ref 11.0–15.0)
Total Lymphocyte: 35 %
WBC: 5 10*3/uL (ref 3.8–10.8)

## 2020-01-16 LAB — MICROALBUMIN / CREATININE URINE RATIO
Creatinine, Urine: 129 mg/dL (ref 20–320)
Microalb Creat Ratio: 5 mcg/mg creat (ref <30)
Microalb, Ur: 0.6 mg/dL

## 2020-01-16 LAB — URINALYSIS, ROUTINE W REFLEX MICROSCOPIC
Bilirubin Urine: NEGATIVE
Glucose, UA: NEGATIVE
Hgb urine dipstick: NEGATIVE
Ketones, ur: NEGATIVE
Leukocytes,Ua: NEGATIVE
Nitrite: NEGATIVE
Protein, ur: NEGATIVE
Specific Gravity, Urine: 1.019 (ref 1.001–1.03)
pH: <=5 (ref 5.0–8.0)

## 2020-01-16 LAB — COMPLETE METABOLIC PANEL WITH GFR
AG Ratio: 1.5 (calc) (ref 1.0–2.5)
ALT: 19 U/L (ref 9–46)
AST: 19 U/L (ref 10–35)
Albumin: 4.1 g/dL (ref 3.6–5.1)
Alkaline phosphatase (APISO): 72 U/L (ref 35–144)
BUN/Creatinine Ratio: 12 (calc) (ref 6–22)
BUN: 16 mg/dL (ref 7–25)
CO2: 27 mmol/L (ref 20–32)
Calcium: 9.7 mg/dL (ref 8.6–10.3)
Chloride: 106 mmol/L (ref 98–110)
Creat: 1.36 mg/dL — ABNORMAL HIGH (ref 0.70–1.11)
GFR, Est African American: 55 mL/min/{1.73_m2} — ABNORMAL LOW (ref >=60)
GFR, Est Non African American: 48 mL/min/{1.73_m2} — ABNORMAL LOW (ref >=60)
Globulin: 2.7 g/dL (calc) (ref 1.9–3.7)
Glucose, Bld: 107 mg/dL — ABNORMAL HIGH (ref 65–99)
Potassium: 4.4 mmol/L (ref 3.5–5.3)
Sodium: 143 mmol/L (ref 135–146)
Total Bilirubin: 0.6 mg/dL (ref 0.2–1.2)
Total Protein: 6.8 g/dL (ref 6.1–8.1)

## 2020-01-16 LAB — HEMOGLOBIN A1C
Hgb A1c MFr Bld: 5.6 % of total Hgb (ref <5.7)
Mean Plasma Glucose: 114 (calc)
eAG (mmol/L): 6.3 (calc)

## 2020-01-16 LAB — LIPID PANEL
Cholesterol: 177 mg/dL (ref <200)
HDL: 73 mg/dL (ref >=40)
LDL Cholesterol (Calc): 87 mg/dL (calc)
Non-HDL Cholesterol (Calc): 104 mg/dL (calc) (ref <130)
Total CHOL/HDL Ratio: 2.4 (calc) (ref <5.0)
Triglycerides: 76 mg/dL (ref <150)

## 2020-01-16 LAB — VITAMIN D 25 HYDROXY (VIT D DEFICIENCY, FRACTURES): Vit D, 25-Hydroxy: 54 ng/mL (ref 30–100)

## 2020-01-16 LAB — MAGNESIUM: Magnesium: 1.8 mg/dL (ref 1.5–2.5)

## 2020-01-16 LAB — PSA: PSA: 0.1 ng/mL (ref <=4.0)

## 2020-01-16 LAB — TSH: TSH: 3.17 mIU/L (ref 0.40–4.50)

## 2020-01-16 LAB — INSULIN, RANDOM: Insulin: 9 u[IU]/mL

## 2020-01-16 NOTE — Progress Notes (Signed)
============================================================  -    Kidney functions stable ============================================================  -  Total Chol = 177 - and LDL Chol = 87 - Both Great- Low risk for Heart Attack  / Stroke ============================================================  -  A1c 5.6% - Normal in NonDiabetic Range ============================================================  -  Vitamin D = 54 - sl low - Recc increase Vit D from 8,000 up to 10,000 units / day  ============================================================  -  All Else - CBC - Kidneys - Electrolytes -  Liver - Magnesium & Thyroid    - all  Normal / OK ============================================================

## 2020-02-04 DIAGNOSIS — H401431 Capsular glaucoma with pseudoexfoliation of lens, bilateral, mild stage: Secondary | ICD-10-CM | POA: Diagnosis not present

## 2020-02-04 DIAGNOSIS — Z961 Presence of intraocular lens: Secondary | ICD-10-CM | POA: Diagnosis not present

## 2020-02-04 DIAGNOSIS — E119 Type 2 diabetes mellitus without complications: Secondary | ICD-10-CM | POA: Diagnosis not present

## 2020-02-04 DIAGNOSIS — H524 Presbyopia: Secondary | ICD-10-CM | POA: Diagnosis not present

## 2020-02-04 LAB — HM DIABETES EYE EXAM

## 2020-02-12 ENCOUNTER — Other Ambulatory Visit: Payer: Self-pay | Admitting: Internal Medicine

## 2020-02-12 ENCOUNTER — Encounter: Payer: Self-pay | Admitting: *Deleted

## 2020-02-12 DIAGNOSIS — F5101 Primary insomnia: Secondary | ICD-10-CM

## 2020-03-28 ENCOUNTER — Other Ambulatory Visit: Payer: Self-pay | Admitting: Internal Medicine

## 2020-04-08 NOTE — Progress Notes (Signed)
MEDICARE ANNUAL WELLNESS VISIT AND FOLLOW UP Assessment:   Encounter for Medicare annual wellness exam 1 year  Essential hypertension Monitor blood pressure at home; call if consistently over 130/80 Continue DASH diet.   Reminder to go to the ER if any CP, SOB, nausea, dizziness, severe HA, changes vision/speech, left arm numbness and tingling and jaw pain.  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  Followed by cardiology  Autonomic postural hypotension Symptoms stable on florinef   Gastric AV malformation Followed by GI  Insomnia, unspecified type Insomnia- good sleep hygiene discussed, increase day time activity Cautioned risks with xanax, seroquel Continue follow up with neurology  Chronic obstructive pulmonary disease, unspecified COPD type ([place]) + hx of smoking Symptoms stable off of inhalers  GERD Well managed on current medications; continue PPI for hx of duodenal ulcer/bleeding Discussed diet, avoiding triggers and other lifestyle changes  Diabetic peripheral neuropathy ([place]) Denies recent symptoms  CKD stage G3a/A1, GFR 45-59 and albumin creatinine ratio <30 mg/g ([facility]) Increase fluids, avoid NSAIDS, monitor sugars, will monitor Check CMP/GFR  Vitamin D deficiency At goal at recent check; continue to recommend supplementation for goal of 70-100 Defer vitamin D level  Mixed hyperlipidemia Continue medications Continue low cholesterol diet and exercise.  Check lipid panel.   Medication management CBC, CMP/GFR  History of TIA (transient ischemic attack) BP/LDL at goal Continue plavix except will stop for 3-4 days for colonoscopy Followed by neurology  History of colonic polyps Reminded due for follow up with Dr. Deatra Ina November of this year  BMI 21, adult Continue to recommend diet heavy in fruits and veggies and low in animal  meats, cheeses, and dairy products, appropriate calorie intake Discuss exercise recommendations routinely Continue to monitor weight at each visit   Over 30 minutes of exam, counseling, chart review, and critical decision making was performed  Future Appointments  Date Time Provider [place]  05/14/2020  1:40 PM Armbruster, Carlota Raspberry, MD [facility]  07/28/2020  2:30 PM Unk Pinto, MD [facility] None  01/21/2021 10:00 AM Unk Pinto, MD [facility] None     Plan:   During the course of the visit the patient was educated and counseled about appropriate screening and preventive services including:    Pneumococcal vaccine   Influenza vaccine  Prevnar 13  Td vaccine  Screening electrocardiogram  Colorectal cancer screening  Diabetes screening  Glaucoma screening  Nutrition counseling    Subjective:  John [name] is a 85 y.o. male who presents for Medicare Annual Wellness Visit and 3 month follow up for HTN, hyperlipidemia, glucose management, and vitamin D Def.   Patient has hx/o mild Dementia & is followed by Dr Rexene Alberts for suspect possible early or atypical Parkinson's Dz.   Patient also has hx/o recurrent kidney stones (>200+) followed by Dr Dutch Gray as needed.   HTN predates 1960's, recenty labile with postural hypotension and now takes 1 to 2 Florinef daily to avoid postural Hypotension.  He has some depression due to his wife passing last year however he has a very supportive family and states he misses the love of his life but is trying to enjoy time with his family like his wife would have wanted him to.   Last colonoscopy: 2020 Dr. Luvenia Starch- has mutliple polyps removed- going back this year- he is on bASA  Patient underwent CABG x 2 V in 1996 And has done well since. Continues to follow  with cardiology.   he has ongoing insomnia and is currently on melatonin, seroquel, xanax, reports symptoms are well controlled on current  regimen. he currently takes 1 mg daily at night to sleep; has not done well with attempts to taper.   BMI is Body mass index is 23.33 kg/m., he has not been working on diet and exercise. Wt Readings from Last 3 Encounters:  04/09/20 167 lb 4.8 oz (75.9 kg)  01/15/20 168 lb 3.2 oz (76.3 kg)  09/28/19 166 lb 6.4 oz (75.5 kg)   His blood pressure has been controlled at home, today their BP is BP: 138/74 He does not workout. He denies chest pain, shortness of breath, dizziness.   He is on cholesterol medication, simvastatin 80mg  1/2 pill a day and denies myalgias. His cholesterol is at goal. The cholesterol last visit was:   Lab Results  Component Value Date   CHOL 177 01/15/2020   HDL 73 01/15/2020   LDLCALC 87 01/15/2020   TRIG 76 01/15/2020   CHOLHDL 2.4 01/15/2020   He has been working on diet and exercise for glucose management (hx of lifestyle controlled diabetes), and denies foot ulcerations, hyperglycemia, hypoglycemia , increased appetite, nausea, paresthesia of the feet, polydipsia, polyuria, visual disturbances, vomiting and weight loss. Last A1C in the office was:  Lab Results  Component Value Date   HGBA1C 5.6 01/15/2020   Last GFR Lab Results  Component Value Date   GFRNONAA 48 (L) 01/15/2020    Patient is on Vitamin D supplement and at goal at recent check:    Lab Results  Component Value Date   VD25OH 54 01/15/2020      Medication Review:  Current Outpatient Medications (Endocrine & Metabolic):  .  fludrocortisone (FLORINEF) 0.1 MG tablet, Take 1 tablet 2 x /day for Low BP  Current Outpatient Medications (Cardiovascular):  .  nitroGLYCERIN (NITROSTAT) 0.4 MG SL tablet, Sig: 1 tablet under tongue every 3 to 5 minutes as needed for Angina - Please Dispense # 2 bottles of # 25 tabs .  simvastatin (ZOCOR) 80 MG tablet, Take 1 tablet at Bedtime for Cholesterol    Current Outpatient Medications (Hematological):  .  clopidogrel (PLAVIX) 75 MG tablet, Take 1  tablet Daily to prevent Blood Clots  Current Outpatient Medications (Other):  [name]  ALPRAZolam (XANAX) 1 MG tablet, Take 1/2 to 1 tablet at Bedtime ONLY if needed for Sleep and please try to limit to 5 days /week to avoid addiction .  Cholecalciferol (VITAMIN D3) 2000 UNITS capsule, Take 8,000 Units by mouth daily.  [name]  escitalopram (LEXAPRO) 10 MG tablet, Take 1 tablet Daily for Anxiety .  finasteride (PROSCAR) 5 MG tablet, Take 1 tablet daily for prostate .  glucose blood (FREESTYLE LITE) test strip, TEST BLOOD SUGAR ONCE DAILY .  Lancets (FREESTYLE) lancets, 1 each by Other route as needed for other. Use as instructed .  Melatonin 5 MG CAPS, Take 10 mg by mouth at bedtime.  .  mirtazapine (REMERON SOL-TAB) 30 MG disintegrating tablet, TAKE 1 TABLET ONE HOUR BEFORE BEDTIME FOR APPETITE & SLEEP .  ondansetron (ZOFRAN) 8 MG tablet, Take 1/2 to 1 tablet 3 x /day Only if needed for Nausea .  pantoprazole (PROTONIX) 40 MG tablet, TAKE ONE TABLET BY MOUTH ONCE DAILY .  potassium citrate (UROCIT-K) 10 MEQ (1080 MG) SR tablet, Take 1 tablet 2 x /day to Prevent Kidney Stones .  QUEtiapine (SEROQUEL) 100 MG tablet, Take 1 to 2 tablets at Bedtime for  Sleep .  QUEtiapine (SEROQUEL) 25 MG tablet, Take 1 tablet 3 x /day .  silodosin (RAPAFLO) 8 MG CAPS capsule, Take 1 capsule at Bedtime for Prostate .  vitamin C (ASCORBIC ACID) 500 MG tablet, Take 500 mg by mouth daily. [name]  zinc gluconate 50 MG tablet, Take 50 mg by mouth every other day.  Allergies: Allergies  Allergen Reactions  . Gabapentin Other (See Comments)    Severe confusion  . Prozac [Fluoxetine Hcl] Other (See Comments)    Patient state that it does not agree with him  . Soma [Carisoprodol] Other (See Comments)    Patient stated that it does not agree with him  . 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 ([place]); History of TIA (transient ischemic attack); Asthma; COPD (chronic obstructive pulmonary disease) ([place]); GERD; History of colonic polyps; Gastric AV malformation; Medication management; Vitamin D deficiency; CKD stage G3a/A1, GFR 45-59 and albumin creatinine ratio <30 mg/g; Autonomic postural hypotension; Esophageal reflux; BMI 21.0-21.9, adult; Diabetic peripheral neuropathy ([place]); Insomnia; Acute medial meniscus tear of left knee; 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  Screening Tests Immunization History  Administered Date(s) Administered  . Influenza, High Dose Seasonal PF 08/13/2013, 08/05/2014, 07/23/2015, 06/22/2016, 07/18/2017, 07/17/2018, 08/02/2019  . PFIZER SARS-COV-2 Vaccination 11/16/2019, 12/06/2019  . Pneumococcal Conjugate-13 08/27/2014  . Pneumococcal Polysaccharide-23 08/06/2012  . Tdap 08/01/2013  . Zoster 05/02/2006  . Zoster Recombinat (Shingrix) 06/26/2019, 10/01/2019   Health Maintenance  Topic Date Due  . COLONOSCOPY  04/04/2020  . INFLUENZA VACCINE  05/25/2020  . HEMOGLOBIN A1C  07/17/2020  . FOOT EXAM  01/13/2021  . URINE MICROALBUMIN  01/14/2021  . OPHTHALMOLOGY EXAM  02/03/2021  . TETANUS/TDAP  08/02/2023  . COVID-19 Vaccine  Completed  . PNA vac Low Risk Adult  Completed   Preventative care: Last colonoscopy: 2020 Dr. Luvenia Starch- has mutliple polyps removed- going back this year- he is on bASA  Names of Other Physician/Practitioners you currently use: 1. [facility] here for primary care 2. Dr. Delton Coombes, eye doctor, last visit 11/2017 3. Dr. Gwendolyn Fill, dentist, last visit 2019  Patient Care Team: Unk Pinto, MD as PCP - General Angelena Form Annita Brod, MD as PCP - Cardiology (Cardiology) Burnell Blanks, MD as Consulting  Physician (Cardiology) Inda Castle, MD (Inactive) as Consulting Physician (Gastroenterology) Jannette Spanner, MD as Referring Physician (Dermatology)  Surgical: He  has a past surgical history that includes Cardiac catheterization; Gastrectomy; Vagotomy; Cystourethroscopy with stent removal.; Irrigation and debridement sabaceous cyst; Colonoscopy; Upper gastrointestinal endoscopy; Excisional hemorrhoidectomy; Polypectomy; Coronary artery bypass graft (1996); Prostate surgery; Cystoscopy with ureteroscopy, stone basketry and stent placement; Ankle fracture surgery (Left, ~ 2012); Hiatal hernia repair; LEFT HEART CATH AND CORS/GRAFTS ANGIOGRAPHY (N/A, 12/23/2016); and Knee arthroscopy with medial menisectomy (Left, 08/24/2019). Family His family history includes Alcohol abuse in his father; Diabetes in his brother; Heart disease in his brother and paternal grandmother; Stroke in his mother. Social history  He reports that he quit smoking about 57 years ago. His smoking use included cigarettes. He smoked 0.50 packs per day. He quit smokeless tobacco use about 1 years ago.  His smokeless tobacco use included chew. He reports that he does not drink alcohol and does not use drugs.  MEDICARE  WELLNESS OBJECTIVES: Physical activity: Current Exercise Habits: The patient does not participate in regular exercise at present Cardiac risk factors: Cardiac Risk Factors include: advanced age (>103men, >53 women);dyslipidemia;hypertension;male gender;sedentary lifestyle Depression/mood screen:   Depression screen [facility] 2/9 04/09/2020  Decreased Interest 0  Down, Depressed, Hopeless 0  PHQ - 2 Score 0  Some recent data might be hidden    ADLs:  In your present state of health, do you have any difficulty performing the following activities: 04/09/2020 01/14/2020  Hearing? N N  Vision? N N  Difficulty concentrating or making decisions? N N  Walking or climbing stairs? N N  Comment - -  Dressing or bathing? N N   Doing errands, shopping? Y N  Some recent data might be hidden     Cognitive Testing  Alert? Yes  Normal Appearance?Yes  Oriented to person? Yes  Place? Yes   Time? Yes  Displays appropriate judgment?Yes  EOL planning: Does Patient Have a Medical Advance Directive?: Yes   Objective:   Today's Vitals   04/09/20 1438  BP: 138/74  Pulse: 75  Temp: (!) 97.5 F (36.4 C)  SpO2: 98%  Weight: 167 lb 4.8 oz (75.9 kg)  Height: 5\' 11"  (1.803 m)  PainSc: 0-No pain   Body mass index is 23.33 kg/m.  General Appearance: Well nourished, in no apparent distress. Eyes: PERRLA, EOMs, conjunctiva no swelling or erythema Sinuses: No Frontal/maxillary tenderness ENT/Mouth: Ext aud canals clear, TMs without erythema, bulging. No erythema, swelling, or exudate on post pharynx.  Tonsils not swollen or erythematous. Hearing normal.  Neck: Supple, thyroid normal.  Respiratory: Respiratory effort normal, BS equal bilaterally without rales, rhonchi, wheezing or stridor.  Cardio: RRR with no MRGs. Brisk peripheral pulses without edema.  Abdomen: Soft, + BS.  Non tender, no guarding, rebound, hernias, masses. Lymphatics: Non tender without lymphadenopathy.  Musculoskeletal: Full ROM, 5/5 strength, Normal gait Skin: Warm, dry without rashes, lesions, ecchymosis.  Neuro: Cranial nerves intact. No cerebellar symptoms.  Psych: Awake and oriented X 3, normal affect, Insight and Judgment appropriate.   Medicare Attestation I have personally reviewed: The patient's medical and social history Their use of alcohol, tobacco or illicit drugs Their current medications and supplements The patient's functional ability including ADLs,fall risks, home safety risks, cognitive, and hearing and visual impairment Diet and physical activities Evidence for depression or mood disorders  The patient's weight, height, BMI, and visual acuity have been recorded in the chart.  I have made referrals, counseling, and  provided education to the patient based on review of the above and I have provided the patient with a written personalized care plan for preventive services.     Vicie Mutters, PA-C   04/09/2020

## 2020-04-09 ENCOUNTER — Ambulatory Visit: Payer: PPO | Admitting: Physician Assistant

## 2020-04-09 ENCOUNTER — Encounter: Payer: Self-pay | Admitting: Physician Assistant

## 2020-04-09 ENCOUNTER — Ambulatory Visit: Payer: PPO | Admitting: Adult Health Nurse Practitioner

## 2020-04-09 ENCOUNTER — Other Ambulatory Visit: Payer: Self-pay

## 2020-04-09 ENCOUNTER — Ambulatory Visit (INDEPENDENT_AMBULATORY_CARE_PROVIDER_SITE_OTHER): Payer: PPO | Admitting: Physician Assistant

## 2020-04-09 VITALS — BP 138/74 | HR 75 | Temp 97.5°F | Ht 71.0 in | Wt 167.3 lb

## 2020-04-09 DIAGNOSIS — K2100 Gastro-esophageal reflux disease with esophagitis, without bleeding: Secondary | ICD-10-CM | POA: Diagnosis not present

## 2020-04-09 DIAGNOSIS — R6889 Other general symptoms and signs: Secondary | ICD-10-CM

## 2020-04-09 DIAGNOSIS — Z860100 Personal history of colon polyps, unspecified: Secondary | ICD-10-CM

## 2020-04-09 DIAGNOSIS — E1142 Type 2 diabetes mellitus with diabetic polyneuropathy: Secondary | ICD-10-CM | POA: Diagnosis not present

## 2020-04-09 DIAGNOSIS — E559 Vitamin D deficiency, unspecified: Secondary | ICD-10-CM

## 2020-04-09 DIAGNOSIS — G214 Vascular parkinsonism: Secondary | ICD-10-CM | POA: Diagnosis not present

## 2020-04-09 DIAGNOSIS — I10 Essential (primary) hypertension: Secondary | ICD-10-CM

## 2020-04-09 DIAGNOSIS — N1831 Chronic kidney disease, stage 3a: Secondary | ICD-10-CM | POA: Diagnosis not present

## 2020-04-09 DIAGNOSIS — J449 Chronic obstructive pulmonary disease, unspecified: Secondary | ICD-10-CM

## 2020-04-09 DIAGNOSIS — Z0000 Encounter for general adult medical examination without abnormal findings: Secondary | ICD-10-CM

## 2020-04-09 DIAGNOSIS — Z79899 Other long term (current) drug therapy: Secondary | ICD-10-CM | POA: Diagnosis not present

## 2020-04-09 DIAGNOSIS — Z0001 Encounter for general adult medical examination with abnormal findings: Secondary | ICD-10-CM | POA: Diagnosis not present

## 2020-04-09 DIAGNOSIS — E2740 Unspecified adrenocortical insufficiency: Secondary | ICD-10-CM | POA: Diagnosis not present

## 2020-04-09 DIAGNOSIS — J45909 Unspecified asthma, uncomplicated: Secondary | ICD-10-CM | POA: Diagnosis not present

## 2020-04-09 DIAGNOSIS — I25709 Atherosclerosis of coronary artery bypass graft(s), unspecified, with unspecified angina pectoris: Secondary | ICD-10-CM

## 2020-04-09 DIAGNOSIS — Z8673 Personal history of transient ischemic attack (TIA), and cerebral infarction without residual deficits: Secondary | ICD-10-CM

## 2020-04-09 DIAGNOSIS — I951 Orthostatic hypotension: Secondary | ICD-10-CM

## 2020-04-09 DIAGNOSIS — K219 Gastro-esophageal reflux disease without esophagitis: Secondary | ICD-10-CM

## 2020-04-09 DIAGNOSIS — E782 Mixed hyperlipidemia: Secondary | ICD-10-CM | POA: Diagnosis not present

## 2020-04-09 DIAGNOSIS — M1712 Unilateral primary osteoarthritis, left knee: Secondary | ICD-10-CM

## 2020-04-10 LAB — COMPLETE METABOLIC PANEL WITH GFR
AG Ratio: 1.5 (calc) (ref 1.0–2.5)
ALT: 10 U/L (ref 9–46)
AST: 12 U/L (ref 10–35)
Albumin: 3.7 g/dL (ref 3.6–5.1)
Alkaline phosphatase (APISO): 60 U/L (ref 35–144)
BUN/Creatinine Ratio: 13 (calc) (ref 6–22)
BUN: 17 mg/dL (ref 7–25)
CO2: 27 mmol/L (ref 20–32)
Calcium: 8.4 mg/dL — ABNORMAL LOW (ref 8.6–10.3)
Chloride: 110 mmol/L (ref 98–110)
Creat: 1.26 mg/dL — ABNORMAL HIGH (ref 0.70–1.11)
GFR, Est African American: 61 mL/min/{1.73_m2} (ref >=60)
GFR, Est Non African American: 52 mL/min/{1.73_m2} — ABNORMAL LOW (ref >=60)
Globulin: 2.5 g/dL (calc) (ref 1.9–3.7)
Glucose, Bld: 94 mg/dL (ref 65–99)
Potassium: 5.1 mmol/L (ref 3.5–5.3)
Sodium: 144 mmol/L (ref 135–146)
Total Bilirubin: 0.4 mg/dL (ref 0.2–1.2)
Total Protein: 6.2 g/dL (ref 6.1–8.1)

## 2020-04-10 LAB — CBC WITH DIFFERENTIAL/PLATELET
Absolute Monocytes: 420 cells/uL (ref 200–950)
Basophils Absolute: 50 cells/uL (ref 0–200)
Basophils Relative: 0.9 %
Eosinophils Absolute: 151 cells/uL (ref 15–500)
Eosinophils Relative: 2.7 %
HCT: 39.6 % (ref 38.5–50.0)
Hemoglobin: 13.5 g/dL (ref 13.2–17.1)
Lymphs Abs: 1288 cells/uL (ref 850–3900)
MCH: 32 pg (ref 27.0–33.0)
MCHC: 34.1 g/dL (ref 32.0–36.0)
MCV: 93.8 fL (ref 80.0–100.0)
MPV: 10.2 fL (ref 7.5–12.5)
Monocytes Relative: 7.5 %
Neutro Abs: 3690 cells/uL (ref 1500–7800)
Neutrophils Relative %: 65.9 %
Platelets: 206 10*3/uL (ref 140–400)
RBC: 4.22 10*6/uL (ref 4.20–5.80)
RDW: 13.3 % (ref 11.0–15.0)
Total Lymphocyte: 23 %
WBC: 5.6 10*3/uL (ref 3.8–10.8)

## 2020-04-10 LAB — LIPID PANEL
Cholesterol: 147 mg/dL (ref <200)
HDL: 59 mg/dL (ref >=40)
LDL Cholesterol (Calc): 72 mg/dL (calc)
Non-HDL Cholesterol (Calc): 88 mg/dL (calc) (ref <130)
Total CHOL/HDL Ratio: 2.5 (calc) (ref <5.0)
Triglycerides: 78 mg/dL (ref <150)

## 2020-04-10 LAB — HEMOGLOBIN A1C
Hgb A1c MFr Bld: 5.3 % of total Hgb (ref <5.7)
Mean Plasma Glucose: 105 (calc)
eAG (mmol/L): 5.8 (calc)

## 2020-04-10 LAB — TSH: TSH: 0.79 mIU/L (ref 0.40–4.50)

## 2020-04-21 ENCOUNTER — Ambulatory Visit: Payer: PPO | Admitting: Physician Assistant

## 2020-05-06 ENCOUNTER — Other Ambulatory Visit: Payer: Self-pay | Admitting: Internal Medicine

## 2020-05-06 DIAGNOSIS — N401 Enlarged prostate with lower urinary tract symptoms: Secondary | ICD-10-CM

## 2020-05-12 ENCOUNTER — Other Ambulatory Visit: Payer: Self-pay | Admitting: Internal Medicine

## 2020-05-12 DIAGNOSIS — F5101 Primary insomnia: Secondary | ICD-10-CM

## 2020-05-12 DIAGNOSIS — F419 Anxiety disorder, unspecified: Secondary | ICD-10-CM

## 2020-05-12 MED ORDER — ALPRAZOLAM 1 MG PO TABS: ORAL_TABLET | ORAL | 0 refills | Status: DC

## 2020-05-14 ENCOUNTER — Encounter: Payer: Self-pay | Admitting: Gastroenterology

## 2020-05-14 ENCOUNTER — Ambulatory Visit: Payer: PPO | Admitting: Gastroenterology

## 2020-05-14 ENCOUNTER — Other Ambulatory Visit: Payer: Self-pay | Admitting: Internal Medicine

## 2020-05-14 ENCOUNTER — Telehealth: Payer: Self-pay

## 2020-05-14 ENCOUNTER — Telehealth: Payer: Self-pay | Admitting: Gastroenterology

## 2020-05-14 VITALS — BP 122/64 | HR 82 | Ht 71.0 in | Wt 174.4 lb

## 2020-05-14 DIAGNOSIS — Z86010 Personal history of colonic polyps: Secondary | ICD-10-CM | POA: Diagnosis not present

## 2020-05-14 DIAGNOSIS — Z7902 Long term (current) use of antithrombotics/antiplatelets: Secondary | ICD-10-CM | POA: Diagnosis not present

## 2020-05-14 MED ORDER — SUTAB 1479-225-188 MG PO TABS: 1.0000 | ORAL_TABLET | Freq: Once | ORAL | 0 refills | Status: AC

## 2020-05-14 NOTE — Patient Instructions (Signed)
If you are age 83 or older, your body mass index should be between 23-30. Your Body mass index is 24.32 kg/m. If this is out of the aforementioned range listed, please consider follow up with your Primary Care Provider.  If you are age 56 or younger, your body mass index should be between 19-25. Your Body mass index is 24.32 kg/m. If this is out of the aformentioned range listed, please consider follow up with your Primary Care Provider.   You have been scheduled for a colonoscopy. Please follow written instructions given to you at your visit today.  Please pick up your prep supplies at the pharmacy within the next 1-3 days. If you use inhalers (even only as needed), please bring them with you on the day of your procedure.  You will be contacted by our office prior to your procedure for directions on holding your Plavix.  If you do not hear from our office 1 week prior to your scheduled procedure, please call [phone] to discuss.   You can continue ASPIRIN while holding Plavix.  Thank you for entrusting me with your care and for choosing [facility], Dr. [place] Cellar

## 2020-05-14 NOTE — Telephone Encounter (Signed)
   John [name] May 04, 1935 [id number]  Dear Dr. Melford Aase:  We have scheduled the above named patient for a(n) colonoscopy procedure. Our records show that he is on anticoagulation therapy.  Please advise as to whether the patient may come off their therapy of Plavix 5 days prior to their procedure which is scheduled for Wednesday, 05-21-20.  Please route your response to Lemar Lofty, CMA or fax response to [phone]. Thank you for your prompt reply.   Sincerely,    [facility]

## 2020-05-14 NOTE — Telephone Encounter (Signed)
Pt called to inform that his PCP Dr. Melford Aase is the physician who monitors his Plavix for clearance for his procedure next week.

## 2020-05-14 NOTE — Telephone Encounter (Signed)
[facility] Pre-operative Risk Assessment     Request for surgical clearance:     Endoscopy Procedure  What type of surgery is being performed?     COLONOSCOPY  When is this surgery scheduled?     05-21-20  What type of clearance is required ?   Pharmacy  Are there any medications that need to be held prior to surgery and how long? PLAVIX 5 DAYS  Practice name and name of physician performing surgery?  [facility]  What is your office phone and fax number?      Phone- [phone]  Fax- [place], CMA  Anesthesia type (None, local, MAC, general) ?       MAC  THANK YOU

## 2020-05-14 NOTE — Telephone Encounter (Signed)
Primary Cardiologist:[name], MD  Chart reviewed as part of pre-operative protocol coverage. Because of John [name]'s past medical history and time since last visit, he/she will require a follow-up visit in order to better assess preoperative cardiovascular risk.  Pre-op covering staff: - Please schedule appointment and call patient to inform them. - Please contact requesting surgeon's office via preferred method (i.e, phone, fax) to inform them of need for appointment prior to surgery.  If applicable, this message will also be routed to pharmacy pool and/or primary cardiologist for input on holding anticoagulant/antiplatelet agent as requested below so that this information is available at time of patient's appointment.   Deberah Pelton, NP  05/14/2020, 2:41 PM

## 2020-05-14 NOTE — Telephone Encounter (Signed)
Anti Coag clearance letter sent to Dr. Unk Pinto.

## 2020-05-14 NOTE — Telephone Encounter (Signed)
Called pt and scheduled appt on 06/09/2020@9 :45 AM with Vin Bhagat,PA

## 2020-05-14 NOTE — Progress Notes (Signed)
HPI :  85 y/o male with a history of CAD s/p CABG, TIA, history of plavix use,  history of multiple adenomas, here for a a follow up visit for history of colon polyps.  He has had numerous polyps removed in the past.  He had 18 adenomas, largest greater than 1 cm in size removed in 2013.  He had a colonoscopy with me in June 2020, 25 adenomas removed at that time.  I had offered him genetic testing which he declined at the time.  Random biopsies of the colon showed no evidence of microscopic colitis.  He has regular bowels most of the time but has a loose stool about 2 to 3 days a week.  He uses some over-the-counter [facility] antidiarrheal, states it works well for him and he uses this a few days per week.  He denies any abdominal pains.  No blood in his stools.  He continues to take a baby aspirin and Plavix 75 mg a day.  He denies any new cardiopulmonary symptoms or recent history of stroke or TIA.  He had an echo in November 2020 showed an EF of 50 to 55%.  We discussed if he wanted any further surveillance exams given the amount of polyps has had in his last few colonoscopies.  Echo 08/2018 - EF 50-55%, moderate AS  Colonoscopy 09/06/2013 - diverticulosis, 31mm cecal polyp EGD 09/12/2012 - GAVE, GEJ stricture s/p Maloney dilation - GEJ and gastric biopsies benign Colonoscopy 09/12/2012 - 18 polyps, largest > 1 cm in right colon, mild proctitis - biopsies without evidence of chronicity  Colonoscopy 04/05/19 - - Nine 3 to 5 mm polyps in the cecum, removed with a cold snare. Resected and retrieved. - Four 3 to 5 mm polyps in the ascending colon, removed with a cold snare. Resected and retrieved. - Two 3 to 8 mm polyps at the hepatic flexure, removed with a cold snare. Resected and retrieved. - Six 3 to 10 mm polyps in the transverse colon, removed with a cold snare. Resected and retrieved. One required snare tip coagulation for hemostasis. - One 4 mm polyp in the descending colon, removed with a  cold snare. Resected and retrieved. - Three 3 to 5 mm polyps in the sigmoid colon, removed with a cold snare. Resected and retrieved. - Diverticulosis in the sigmoid colon. - Internal hemorrhoids. - The examination was otherwise normal. - Spastic colon - Biopsies were taken with a cold forceps from the right colon and left colon for evaluation of microscopic colitis.  Diagnosis 1. Surgical [P], colon, sigmoid, transverse, cecum, ascending, hepatic flexure, descending, polyp (25) - TUBULAR ADENOMA (MULTIPLE FRAGMENTS). - NO HIGH GRADE DYSPLASIA OR MALIGNANCY 2. Surgical [P], random sites - BENIGN COLONIC MUCOSA. - NO ACTIVE INFLAMMATION OR EVIDENCE OF MICROSCOPIC COLITIS. - NO DYSPLASIA OR MALIGNANCY   Past Medical History:  Diagnosis Date  . Anxiety   . Arthritis    "lower back; left ankle" (03/20/2014)  . ASTHMA   . Blood transfusion    "related to OHS, ulcers"  . CAD (coronary artery disease)    a. s/p 2V CABG 1996. b. Last cath 12/2016 -> patent grafts, med rx.  . CAD, ARTERY BYPASS GRAFT   . Cataract   . CKD (chronic kidney disease), stage II   . Colon polyps    adenomatous polyps  . COPD   . Depression   . Diabetes mellitus without complication (HCC)    no meds now  . Diverticulosis   .  Duodenal ulcer without hemorrhage or perforation 12/29/2011  . GASTROESOPHAGEAL REFLUX DISEASE   . HYPERLIPIDEMIA   . Insomnia   . 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 Memory loss   . Nephrolithiasis    "I've had over 320 kidney stones" (03/20/2014)  . NEPHROLITHIASIS, HX OF 03/12/2009  . Parkinsonism ([place])   . Sleep apnea    does not use CPAP  . Stroke [facility])    TIA  . SUPRAVENTRICULAR TACHYCARDIA, HX OF   . SVT (supraventricular tachycardia) ([place]) 03/12/2009  . TIA   . Ulcer      Past Surgical History:  Procedure Laterality Date  . ANKLE FRACTURE SURGERY Left ~ 2012  . CARDIAC CATHETERIZATION    . COLONOSCOPY    . CORONARY ARTERY BYPASS GRAFT  1996   CABG X2  . CYSTOSCOPY WITH  URETEROSCOPY, STONE BASKETRY AND STENT PLACEMENT    . Cystourethroscopy with stent removal.    . EXCISIONAL HEMORRHOIDECTOMY    . GASTRECTOMY    . HIATAL HERNIA REPAIR    . IRRIGATION AND DEBRIDEMENT SEBACEOUS CYST     "off my back  . KNEE ARTHROSCOPY WITH MEDIAL MENISECTOMY Left 08/24/2019   Procedure: LEFT KNEE ARTHROSCOPY WITH PARTIAL MEDIAL MENISCECTOMY;  Surgeon: Leandrew Koyanagi, MD;  Location: [place];  Service: Orthopedics;  Laterality: Left;  . LEFT HEART CATH AND CORS/GRAFTS ANGIOGRAPHY N/A 12/23/2016   Procedure: Left Heart Cath and Cors/Grafts Angiography;  Surgeon: Burnell Blanks, MD;  Location: [facility];  Service: Cardiovascular;  Laterality: N/A;  . POLYPECTOMY    . PROSTATE SURGERY     "took the center of my prostate out"  . UPPER GASTROINTESTINAL ENDOSCOPY    . VAGOTOMY     Family History  Problem Relation Age of Onset  . Alcohol abuse Father   . Stroke Mother   . Heart disease Brother   . Diabetes Brother   . Heart disease Paternal Grandmother   . Colon cancer Neg Hx   . Stomach cancer Neg Hx   . Esophageal cancer Neg Hx   . Rectal cancer Neg Hx    Social History   Tobacco Use  . Smoking status: Former Smoker    Packs/day: 0.50    Types: Cigarettes    Quit date: 10/25/1962    Years since quitting: 57.5  . Smokeless tobacco: Former Systems developer    Types: Chew    Quit date: 04/15/2018  Vaping Use  . Vaping Use: Never used  Substance Use Topics  . Alcohol use: No    Alcohol/week: 0.0 standard drinks  . Drug use: No   Current Outpatient Medications  Medication Sig Dispense Refill  . ALPRAZolam (XANAX) 1 MG tablet Take 1/2 to 1 tablet at Bedtime ONLY if needed for Sleep and please try to limit to 5 days /week to avoid addiction 90 tablet 0  . Cholecalciferol (VITAMIN D3) 2000 UNITS capsule Take 8,000 Units by mouth daily.     . clopidogrel (PLAVIX) 75 MG tablet Take 1 tablet Daily to prevent Blood Clots 90 tablet 3  . escitalopram  (LEXAPRO) 10 MG tablet Take 1 tablet Daily for Anxiety 90 tablet 3  . finasteride (PROSCAR) 5 MG tablet TAKE 1 TABLET BY MOUTH ONCE DAILY FOR PROSTATE 90 tablet 1  . fludrocortisone (FLORINEF) 0.1 MG tablet Take 1 tablet 2 x /day for Low BP 180 tablet 3  . glucose blood (FREESTYLE LITE) test strip TEST BLOOD SUGAR ONCE DAILY 100 strip 3  .  Lancets (FREESTYLE) lancets 1 each by Other route as needed for other. Use as instructed    . Melatonin 5 MG CAPS Take 10 mg by mouth at bedtime.     . mirtazapine (REMERON SOL-TAB) 30 MG disintegrating tablet TAKE 1 TABLET ONE HOUR BEFORE BEDTIME FOR APPETITE & SLEEP 90 tablet 0  . nitroGLYCERIN (NITROSTAT) 0.4 MG SL tablet Sig: 1 tablet under tongue every 3 to 5 minutes as needed for Angina - Please Dispense # 2 bottles of # 25 tabs 50 tablet 6  . ondansetron (ZOFRAN) 8 MG tablet Take 1/2 to 1 tablet 3 x /day Only if needed for Nausea 30 tablet 0  . pantoprazole (PROTONIX) 40 MG tablet TAKE ONE TABLET BY MOUTH ONCE DAILY 90 tablet 3  . potassium citrate (UROCIT-K) 10 MEQ (1080 MG) SR tablet Take 1 tablet 2 x /day to Prevent Kidney Stones 180 tablet 3  . QUEtiapine (SEROQUEL) 100 MG tablet Take 1 to 2 tablets at Bedtime for Sleep 180 tablet 0  . silodosin (RAPAFLO) 8 MG CAPS capsule Take 1 capsule at Bedtime for Prostate 90 capsule 3  . simvastatin (ZOCOR) 80 MG tablet Take 1 tablet at Bedtime for Cholesterol 90 tablet 3  . vitamin C (ASCORBIC ACID) 500 MG tablet Take 500 mg by mouth daily.    [name] zinc gluconate 50 MG tablet Take 50 mg by mouth every other day.     No current facility-administered medications for this visit.   Allergies  Allergen Reactions  . Gabapentin Other (See Comments)    Severe confusion  . Prozac [Fluoxetine Hcl] Other (See Comments)    Patient state that it does not agree with him  . Soma [Carisoprodol] Other (See Comments)    Patient stated that it does not agree with him  . Beta Adrenergic Blockers Other (See Comments)     REACTION: Bradycardia     Review of Systems: All systems reviewed and negative except where noted in HPI.   Lab Results  Component Value Date   WBC 5.6 04/09/2020   HGB 13.5 04/09/2020   HCT 39.6 04/09/2020   MCV 93.8 04/09/2020   PLT 206 04/09/2020    Lab Results  Component Value Date   CREATININE 1.26 (H) 04/09/2020   BUN 17 04/09/2020   NA 144 04/09/2020   K 5.1 04/09/2020   CL 110 04/09/2020   CO2 27 04/09/2020    Lab Results  Component Value Date   ALT 10 04/09/2020   AST 12 04/09/2020   ALKPHOS 47 05/03/2017   BILITOT 0.4 04/09/2020     Physical Exam: BP 122/64 (BP Location: Left Arm, Patient Position: Sitting, Cuff Size: Normal)   Pulse 82   Ht 5\' 11"  (1.803 m)   Wt 174 lb 6 oz (79.1 kg)   SpO2 96%   BMI 24.32 kg/m  Constitutional: Pleasant,well-developed, male in no acute distress. HEENT: Normocephalic and atraumatic. Conjunctivae are normal. No scleral icterus. Neck supple.  Cardiovascular: Normal rate, regular rhythm.  Pulmonary/chest: Effort normal and breath sounds normal. No wheezing, rales or rhonchi. Abdominal: Soft, nondistended, nontender. There are no masses palpable.  Extremities: no edema Lymphadenopathy: No cervical adenopathy noted. Neurological: Alert and oriented to person place and time. Skin: Skin is warm and dry. No rashes noted. Psychiatric: Normal mood and affect. Behavior is normal.   ASSESSMENT AND PLAN: 85 year old male here for reassessment the following:  History of colon polyps / antiplatelet use - numerous adenomas removed on his last 2 exams,  including about 25 removed on colonoscopy 1 year ago.  He has declined genetic testing for polyposis syndrome.  We discussed at his age and comorbidities if he wanted to have any further surveillance exams.  I discussed colonoscopy with him, risks and benefits, vs not performing any further exams, risks / benefits.  If he wants to have a colonoscopy he will need to hold Plavix for 5  days, can continue baby aspirin throughout.  He is otherwise quite functional and feels well.  He strongly wanted to have 1 more colonoscopy before stopping further exams.  Assuming no high risk lesions or significant burden of polyps on this exam, will likely stop future surveillance given his age.  He agreed with the plan, will reach out to his cardiologist for approval to hold the Plavix, will await results with further recommendations.  Cayuga Cellar, MD [facility]

## 2020-05-15 NOTE — Progress Notes (Signed)
Thank you. Jan, FYI, clearance regarding Plavix use for this procedure

## 2020-05-15 NOTE — Telephone Encounter (Signed)
Per letter from Dr. Melford Aase,  [place] for pt to hold Plavix for 5 days prior to procedure starting tomorrow, 05-16-20.  Patient informed.

## 2020-05-21 ENCOUNTER — Encounter: Payer: PPO | Admitting: Gastroenterology

## 2020-05-21 ENCOUNTER — Other Ambulatory Visit: Payer: Self-pay

## 2020-05-21 ENCOUNTER — Ambulatory Visit (AMBULATORY_SURGERY_CENTER): Payer: PPO | Admitting: Gastroenterology

## 2020-05-21 ENCOUNTER — Encounter: Payer: Self-pay | Admitting: Gastroenterology

## 2020-05-21 VITALS — BP 116/69 | HR 62 | Temp 97.5°F | Resp 17 | Ht 71.0 in | Wt 174.0 lb

## 2020-05-21 DIAGNOSIS — D123 Benign neoplasm of transverse colon: Secondary | ICD-10-CM

## 2020-05-21 DIAGNOSIS — Z86010 Personal history of colonic polyps: Secondary | ICD-10-CM

## 2020-05-21 DIAGNOSIS — D125 Benign neoplasm of sigmoid colon: Secondary | ICD-10-CM

## 2020-05-21 DIAGNOSIS — D120 Benign neoplasm of cecum: Secondary | ICD-10-CM

## 2020-05-21 MED ORDER — SODIUM CHLORIDE 0.9 % IV SOLN: 500.0000 mL | Freq: Once | INTRAVENOUS | Status: DC

## 2020-05-21 NOTE — Progress Notes (Signed)
Called to room to assist during endoscopic procedure.  Patient ID and intended procedure confirmed with present staff. Received instructions for my participation in the procedure from the performing physician.  

## 2020-05-21 NOTE — Patient Instructions (Signed)
Impression/Recommendations:  Polyp handout given to patient. Diverticulosis handout given to patient.  Resume previous diet. Continue present medications. Resume Plavix tomorrow.  Await pathology results.  YOU HAD AN ENDOSCOPIC PROCEDURE TODAY AT [facility]:   Refer to the procedure report that was given to you for any specific questions about what was found during the examination.  If the procedure report does not answer your questions, please call your gastroenterologist to clarify.  If you requested that your care partner not be given the details of your procedure findings, then the procedure report has been included in a sealed envelope for you to review at your convenience later.  YOU SHOULD EXPECT: Some feelings of bloating in the abdomen. Passage of more gas than usual.  Walking can help get rid of the air that was put into your GI tract during the procedure and reduce the bloating. If you had a lower endoscopy (such as a colonoscopy or flexible sigmoidoscopy) you may notice spotting of blood in your stool or on the toilet paper. If you underwent a bowel prep for your procedure, you may not have a normal bowel movement for a few days.  Please Note:  You might notice some irritation and congestion in your nose or some drainage.  This is from the oxygen used during your procedure.  There is no need for concern and it should clear up in a day or so.  SYMPTOMS TO REPORT IMMEDIATELY:   Following lower endoscopy (colonoscopy or flexible sigmoidoscopy):  Excessive amounts of blood in the stool  Significant tenderness or worsening of abdominal pains  Swelling of the abdomen that is new, acute  Fever of 100F or higher  For urgent or emergent issues, a gastroenterologist can be reached at any hour by calling [phone]. Do not use MyChart messaging for urgent concerns.    DIET:  We do recommend a small meal at first, but then you may proceed to your regular diet.   Drink plenty of fluids but you should avoid alcoholic beverages for 24 hours.  ACTIVITY:  You should plan to take it easy for the rest of today and you should NOT DRIVE or use heavy machinery until tomorrow (because of the sedation medicines used during the test).    FOLLOW UP: Our staff will call the number listed on your records 48-72 hours following your procedure to check on you and address any questions or concerns that you may have regarding the information given to you following your procedure. If we do not reach you, we will leave a message.  We will attempt to reach you two times.  During this call, we will ask if you have developed any symptoms of COVID 19. If you develop any symptoms (ie: fever, flu-like symptoms, shortness of breath, cough etc.) before then, please call [phone].  If you test positive for Covid 19 in the 2 weeks post procedure, please call and report this information to [place].    If any biopsies were taken you will be contacted by phone or by letter within the next 1-3 weeks.  Please call us at [phone] if you have not heard about the biopsies in 3 weeks.    SIGNATURES/CONFIDENTIALITY: You and/or your care partner have signed paperwork which will be entered into your electronic medical record.  These signatures attest to the fact that that the information above on your After Visit Summary has been reviewed and is understood.  Full responsibility of the confidentiality of this  discharge information lies with you and/or your care-partner.

## 2020-05-21 NOTE — Op Note (Signed)
[facility] Patient Name: John [name] Procedure Date: 05/21/2020 7:54 AM MRN: [id number] Endoscopist: Remo Lipps P. Havery Moros , MD Age: 85 Referring MD:  Date of Birth: [birth year] Gender: Male Account #: [id number] Procedure:                Colonoscopy Indications:              High risk colon cancer surveillance: Personal                            history of colonic polyps (25 adenomas removed one                            year ago, history of numerous adenomas) Medicines:                Monitored Anesthesia Care Procedure:                Pre-Anesthesia Assessment:                           - Prior to the procedure, a History and Physical                            was performed, and patient medications and                            allergies were reviewed. The patient's tolerance of                            previous anesthesia was also reviewed. The risks                            and benefits of the procedure and the sedation                            options and risks were discussed with the patient.                            All questions were answered, and informed consent                            was obtained. Prior Anticoagulants: The patient has                            taken Plavix (clopidogrel), last dose was 5 days                            prior to procedure. ASA Grade Assessment: III - A                            patient with severe systemic disease. After                            reviewing the risks and benefits, the patient was  deemed in satisfactory condition to undergo the                            procedure.                           After obtaining informed consent, the colonoscope                            was passed under direct vision. Throughout the                            procedure, the patient's blood pressure, pulse, and                            oxygen saturations were monitored continuously. The                             Colonoscope was introduced through the anus and                            advanced to the the cecum, identified by                            appendiceal orifice and ileocecal valve. The                            colonoscopy was performed without difficulty. The                            patient tolerated the procedure well. The quality                            of the bowel preparation was good. The ileocecal                            valve, appendiceal orifice, and rectum were                            photographed. Scope In: 7:58:36 AM Scope Out: 8:25:14 AM Scope Withdrawal Time: 0 hours 21 minutes 19 seconds  Total Procedure Duration: 0 hours 26 minutes 38 seconds  Findings:                 Skin tags were found on perianal exam.                           Scattered medium-mouthed diverticula were found in                            the entire colon.                           Two flat and sessile polyps were found in the  cecum. The polyps were 3 to 4 mm in size. These                            polyps were removed with a cold snare. Resection                            and retrieval were complete.                           Three sessile polyps were found in the transverse                            colon. The polyps were 3 to 4 mm in size. These                            polyps were removed with a cold snare. Resection                            and retrieval were complete.                           A 4 to 5 mm polyp was found in the sigmoid colon.                            The polyp was sessile. The polyp was removed with a                            cold snare. Resection and retrieval were complete.                           The colon was tortuous.                           The exam was otherwise without abnormality. Complications:            No immediate complications. Estimated blood loss:                             Minimal. Estimated Blood Loss:     Estimated blood loss was minimal. Impression:               - Perianal skin tags found on perianal exam.                           - Diverticulosis in the entire examined colon.                           - Two 3 to 4 mm polyps in the cecum, removed with a                            cold snare. Resected and retrieved.                           - Three  3 to 4 mm polyps in the transverse colon,                            removed with a cold snare. Resected and retrieved.                           - One 4 to 5 mm polyp in the sigmoid colon, removed                            with a cold snare. Resected and retrieved.                           - Tortuous colon.                           - The examination was otherwise normal. Recommendation:           - Patient has a contact number available for                            emergencies. The signs and symptoms of potential                            delayed complications were discussed with the                            patient. Return to normal activities tomorrow.                            Written discharge instructions were provided to the                            patient.                           - Resume previous diet.                           - Continue present medications.                           - Resume Plavix tomorrow                           - Await pathology results. Remo Lipps P. Rhonin Trott, MD 05/21/2020 8:35:13 AM This report has been signed electronically.

## 2020-05-21 NOTE — Progress Notes (Signed)
pt tolerated well. VSS. awake and to recovery. Report given to RN.  

## 2020-05-21 NOTE — Progress Notes (Signed)
Pt's states no medical or surgical changes since previsit or office visit.  Vitals- Courtney 

## 2020-05-23 ENCOUNTER — Telehealth: Payer: Self-pay | Admitting: *Deleted

## 2020-05-23 NOTE — Telephone Encounter (Signed)
  Follow up Call-  Call back number 05/21/2020 04/05/2019  Post procedure Call Back phone  # [phone] or [phone]  Permission to leave phone message Yes Yes  Some recent data might be hidden     Patient questions: Have you developed a fever since your procedure? no 2.   Have you had an respiratory symptoms (SOB or cough) since your procedure? no  3.   Have you tested positive for COVID 19 since your procedure no  4.   Have you had any family members/close contacts diagnosed with the COVID 19 since your procedure?  no   If yes to any of these questions please route to Joylene John, RN and Erenest Rasher, RN Do you have a fever, pain , or abdominal swelling? No. Pain Score  0 *  Have you tolerated food without any problems? Yes.    Have you been able to return to your normal activities? Yes.    Do you have any questions about your discharge instructions: Diet   No. Medications  No. Follow up visit  No.  Do you have questions or concerns about your Care? No.  Actions: * If pain score is 4 or above: No action needed, pain <4.

## 2020-05-27 ENCOUNTER — Encounter: Payer: Self-pay | Admitting: Gastroenterology

## 2020-06-09 ENCOUNTER — Ambulatory Visit: Payer: PPO | Admitting: Physician Assistant

## 2020-06-26 ENCOUNTER — Other Ambulatory Visit: Payer: Self-pay | Admitting: Internal Medicine

## 2020-07-08 DIAGNOSIS — M65332 Trigger finger, left middle finger: Secondary | ICD-10-CM | POA: Diagnosis not present

## 2020-07-22 ENCOUNTER — Other Ambulatory Visit: Payer: Self-pay | Admitting: Internal Medicine

## 2020-07-22 MED ORDER — CEPHALEXIN 500 MG PO CAPS: ORAL_CAPSULE | ORAL | 1 refills | Status: DC

## 2020-07-23 NOTE — Progress Notes (Signed)
6 MONTH FOLLOW UP  Assessment:   Essential hypertension Monitor blood pressure at home; call if consistently over 130/80 Continue DASH diet.   Reminder to go to the ER if any CP, SOB, nausea, dizziness, severe HA, changes vision/speech, left arm numbness and tingling and jaw pain.  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) History of TIA Taking plavix 75mg , no bASA hx of duodenal ulcer/bleeding, Control blood pressure, lipids and glucose Disscused lifestyle modifications, diet & exercise Continue to monitor  Autonomic postural hypotension Symptoms stable on florinef daily and PRN dose if low  CKD stage G3a/A1, GFR 45-59 and albumin creatinine ratio <30 mg/g (HCC) ncrease fluids, avoid NSAIDS, monitor sugars, will monitor Check CMP/GFR  Primary insomnia Insomnia- good sleep hygiene discussed, increase day time activity Cautioned risks with xanax PRN, seroquel 100mg  Continue follow up with neurology  Chronic obstructive pulmonary disease, unspecified COPD type (HCC) + hx of smoking Symptoms stable off of inhalers Doing well Continue to monitor   Diabetic peripheral neuropathy ([place]) Denies any recent symptoms  Vitamin D deficiency Continue supplementation to maintain goal of 70-100 Taking Vitamin D 2,000IU taking 8,000IU daily Defer vitamin D level   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 Managing at this time  Abscess of back I&D preformed in office Tolerated well Discussed monitoring area, complete oral antibiotics as prescribed, tolerating well If returns or not resolved may need referral to  general surgery for removal?  Medication management Continued  BMI 24, adult Continue to recommend diet heavy in fruits and veggies and low in animal meats, cheeses, and dairy products, appropriate calorie intake Discuss exercise recommendations routinely Continue to monitor weight at each visit   Over 30 minutes of face to face interview, exam, counseling, chart review, and critical decision making was performed  Future Appointments  Date Time Provider [place]  12/11/2020  3:40 PM Burnell Blanks, MD [facility]  01/21/2021 10:00 AM Unk Pinto, MD [facility] None     Plan:   During the course of the visit the patient was educated and counseled about appropriate screening and preventive services including:    Pneumococcal vaccine   Influenza vaccine  Prevnar 13  Td vaccine  Screening electrocardiogram  Colorectal cancer screening  Diabetes screening  Glaucoma screening  Nutrition counseling    Subjective:  John [name] is a 85 y.o. male who presents for Medicare Annual Wellness Visit and 3 month follow up for HTN, hyperlipidemia, glucose management, and vitamin D Def.  He reports a large area to his right upper back that has been draining.  He reports at night he has put a towel on the bed.  He wears an undershirt because it is draining.  He is not able to lay on the area as it is tender.  He reports he has had something similar in the past but typically he is able to reach to assist with drainage. He reports he has not had one that has been this tender and is concerned about infection.  He contacted our office and was started on oral antibiotics and is tolerating this well.  Has not noticed a change to the area in his back.  Patient has hx/o mild Dementia & is followed by Dr Rexene Alberts for suspect possible early or atypical Parkinson's Dz / vascular dementia  Patient also has hx/o recurrent kidney stones (>200+) followed by Dr  Dutch Gray as needed.   HTN predates 1960's, recenty labile with postural hypotension and now takes 1 to 2 Florinef daily to avoid postural Hypotension.  He has some depression due to his wife passing last year however he has a very supportive family and states he misses the love of his life but is trying to enjoy time with his family like his wife would have wanted him to.   Last colonoscopy: 2020 Dr. Luvenia Starch- has mutliple polyps removed- going back this year- he is on bASA  Patient underwent CABG x 2 V in 1996 And has done well since. Continues to follow with cardiology.   he has ongoing insomnia and is currently on melatonin, seroquel, xanax, reports symptoms are well controlled on current regimen. he currently takes 1 mg daily at night to sleep; has not done well with attempts to taper.   BMI is Body mass index is 24.63 kg/m., he has not been working on diet and exercise. Wt Readings from Last 3 Encounters:  07/24/20 176 lb 9.6 oz (80.1 kg)  05/21/20 174 lb (78.9 kg)  05/14/20 174 lb 6 oz (79.1 kg)   His blood pressure has been controlled at home, today their BP is BP: 136/72 He does not workout. He denies chest pain, shortness of breath, dizziness.   He is on cholesterol medication, simvastatin 80mg  1/2 pill a day and denies myalgias. His cholesterol is at goal. The cholesterol last visit was:   Lab Results  Component Value Date   CHOL 173 07/24/2020   HDL 64 07/24/2020   LDLCALC 90 07/24/2020   TRIG 95 07/24/2020   CHOLHDL 2.7 07/24/2020   He has been working on diet and exercise for glucose management (hx of lifestyle controlled diabetes), and denies foot ulcerations, hyperglycemia, hypoglycemia , increased appetite, nausea, paresthesia of the feet, polydipsia, polyuria, visual disturbances, vomiting and weight loss. Last A1C in the office was:  Lab Results  Component Value Date   HGBA1C 5.3 04/09/2020   Last GFR Lab Results  Component Value Date   GFRNONAA 52 (L)  07/24/2020    Patient is on Vitamin D supplement and at goal at recent check:    Lab Results  Component Value Date   VD25OH 54 01/15/2020      Medication Review:  Current Outpatient Medications (Endocrine & Metabolic):  .  fludrocortisone (FLORINEF) 0.1 MG tablet, Take 1 tablet 2 x /day for Low BP  Current Outpatient Medications (Cardiovascular):  .  nitroGLYCERIN (NITROSTAT) 0.4 MG SL tablet, Sig: 1 tablet under tongue every 3 to 5 minutes as needed for Angina - Please Dispense # 2 bottles of # 25 tabs .  simvastatin (ZOCOR) 80 MG tablet, Take 1 tablet at Bedtime for Cholesterol    Current Outpatient Medications (Hematological):  .  clopidogrel (PLAVIX) 75 MG tablet, Take 1 tablet Daily to prevent Blood Clots  Current Outpatient Medications (Other):  [name]  ALPRAZolam (XANAX) 1 MG tablet, Take 1/2 to 1 tablet at Bedtime ONLY if needed for Sleep and please try to limit to 5 days /week to avoid addiction .  cephALEXin (KEFLEX) 500 MG capsule, Take    1 capsule       4 x /day       with Meals & Bedtime with Food for Skin  Infection .  Cholecalciferol (VITAMIN D3) 2000 UNITS capsule, Take 8,000 Units by mouth daily.  [name]  escitalopram (LEXAPRO) 10 MG tablet, Take 1 tablet Daily for Anxiety .  finasteride (PROSCAR) 5 MG tablet, TAKE 1 TABLET BY MOUTH ONCE DAILY FOR PROSTATE .  glucose blood (FREESTYLE LITE) test strip, TEST BLOOD SUGAR ONCE DAILY .  Lancets (FREESTYLE) lancets, 1 each by Other route as needed for other. Use as instructed .  Melatonin 5 MG CAPS, Take 10 mg by mouth at bedtime.  .  mirtazapine (REMERON SOL-TAB) 30 MG disintegrating tablet, TAKE 1 TABLET BY MOUTH ONE HOUR BEFORE BEDTIME FOR APPETITE AND SLEEP .  ondansetron (ZOFRAN) 8 MG tablet, Take 1/2 to 1 tablet 3 x /day Only if needed for Nausea .  pantoprazole (PROTONIX) 40 MG tablet, TAKE ONE TABLET BY MOUTH ONCE DAILY .  potassium citrate (UROCIT-K) 10 MEQ (1080 MG) SR tablet, Take 1 tablet 2 x /day to Prevent Kidney  Stones .  QUEtiapine (SEROQUEL) 100 MG tablet, Take 1 to 2 tablets at Bedtime for Sleep .  silodosin (RAPAFLO) 8 MG CAPS capsule, Take 1 capsule at Bedtime for Prostate .  vitamin C (ASCORBIC ACID) 500 MG tablet, Take 500 mg by mouth daily. [name]  zinc gluconate 50 MG tablet, Take 50 mg by mouth every other day.  Allergies: Allergies  Allergen Reactions  . Gabapentin Other (See Comments)    Severe confusion  . Prozac [Fluoxetine Hcl] Other (See Comments)    Patient state that it does not agree with him  . Soma [Carisoprodol] Other (See Comments)    Patient stated that it does not agree with him  . 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 ([place]); History of TIA (transient ischemic attack); Asthma; COPD (chronic obstructive pulmonary disease) ([place]); GERD; History of colonic polyps; Gastric AV malformation; Medication management; Vitamin D deficiency; CKD stage G3a/A1, GFR 45-59 and albumin creatinine ratio <30 mg/g ([place]); Autonomic postural hypotension; Esophageal reflux; BMI 21.0-21.9, adult; Diabetic peripheral neuropathy ([place]); Insomnia; Acute medial meniscus tear of left knee; 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  Screening Tests Immunization History  Administered Date(s) Administered  . Influenza, High Dose Seasonal PF 08/13/2013, 08/05/2014, 07/23/2015, 06/22/2016, 07/18/2017, 07/17/2018, 08/02/2019  . PFIZER SARS-COV-2 Vaccination 11/16/2019, 12/06/2019, 07/22/2020  . Pneumococcal Conjugate-13 08/27/2014  . Pneumococcal Polysaccharide-23 08/06/2012  . Tdap 08/01/2013  . Zoster 05/02/2006  . Zoster Recombinat (Shingrix) 06/26/2019, 10/01/2019   Health Maintenance  Topic Date Due  .  INFLUENZA VACCINE  05/25/2020  . HEMOGLOBIN A1C  10/09/2020  . FOOT EXAM  01/13/2021  . URINE MICROALBUMIN  01/14/2021  . OPHTHALMOLOGY EXAM  02/03/2021  . COLONOSCOPY  05/21/2021  . TETANUS/TDAP  08/02/2023  . COVID-19 Vaccine  Completed  . PNA vac Low Risk Adult  Completed   Preventative care: Last colonoscopy: 2020 Dr. Luvenia Starch- has mutliple polyps removed- going back this year- he is on bASA  Names of Other Physician/Practitioners you currently use: 1. [facility] here for primary care 2. Dr. Delton Coombes, eye doctor, last visit 11/2017 3. Dr. Gwendolyn Fill, dentist, last visit 2019  Patient Care Team: Unk Pinto, MD as PCP - General Angelena Form Annita Brod, MD as PCP - Cardiology (Cardiology) Burnell Blanks, MD as Consulting  Physician (Cardiology) Inda Castle, MD (Inactive) as Consulting Physician (Gastroenterology) Jannette Spanner, MD as Referring Physician (Dermatology)  Surgical: He  has a past surgical history that includes Cardiac catheterization; Gastrectomy; Vagotomy; Cystourethroscopy with stent removal.; Irrigation and debridement sabaceous cyst; Colonoscopy; Upper gastrointestinal endoscopy; Excisional hemorrhoidectomy; Polypectomy; Coronary artery bypass graft (1996); Prostate surgery; Cystoscopy with ureteroscopy, stone basketry and stent placement; Ankle fracture surgery (Left, ~ 2012); Hiatal hernia repair; LEFT HEART CATH AND CORS/GRAFTS ANGIOGRAPHY (N/A, 12/23/2016); and Knee arthroscopy with medial menisectomy (Left, 08/24/2019). Family His family history includes Alcohol abuse in his father; Diabetes in his brother; Heart disease in his brother and paternal grandmother; Stroke in his mother. Social history  He reports that he quit smoking about 57 years ago. His smoking use included cigarettes. He smoked 0.50 packs per day. He quit smokeless tobacco use about 2 years ago.  His smokeless tobacco use included chew. He  reports that he does not drink alcohol and does not use drugs.     Objective:   Today's Vitals   07/24/20 1155  BP: 136/72  Pulse: 79  Resp: 16  Temp: (!) 96.9 F (36.1 C)  SpO2: 96%  Weight: 176 lb 9.6 oz (80.1 kg)   Body mass index is 24.63 kg/m.  General Appearance: Well nourished, in no apparent distress. Eyes: PERRLA, EOMs, conjunctiva no swelling or erythema Sinuses: No Frontal/maxillary tenderness ENT/Mouth: Ext aud canals clear, TMs without erythema, bulging. No erythema, swelling, or exudate on post pharynx.  Tonsils not swollen or erythematous. Hearing normal.  Neck: Supple, thyroid normal.  Respiratory: Respiratory effort normal, BS equal bilaterally without rales, rhonchi, wheezing or stridor.  Cardio: RRR with no MRGs. Brisk peripheral pulses without edema.  Abdomen: Soft, + BS.  Non tender, no guarding, rebound, hernias, masses. Lymphatics: Non tender without lymphadenopathy.  Musculoskeletal: Full ROM, 5/5 strength, Normal gait Skin: Warm, dry without rashes, lesions, ecchymosis.  Large area of erythema noted to right upper back above scapula 12cm x 10cm approx firm, raised, Pinpoint opening and yellow exudate noted. Neuro: Cranial nerves intact. No cerebellar symptoms.  Psych: Awake and oriented X 3, normal affect, Insight and Judgment appropriate.   PROCEDURE NOTE: I&D of Abscess back Verbal consent obtained.  Discussed risks/benefit Discussed local anesthesia with 1% lidocaine. Consent for manual drainage without. Site cleansed with alcohol  With firm pressure, discharge of copious amounts of malodorus green pus and serosanguinous fluid expressed.  Firm loculations? palpated Cleansed and dressed, discussed after care and monitoring. After care instructions provided. Patient to return to clinic with new or worsening symptoms for reevaluation and or referral.   John Males, DNP [facility] 07/24/2020  12:31  PM

## 2020-07-24 ENCOUNTER — Encounter: Payer: Self-pay | Admitting: Adult Health Nurse Practitioner

## 2020-07-24 ENCOUNTER — Other Ambulatory Visit: Payer: Self-pay

## 2020-07-24 ENCOUNTER — Ambulatory Visit (INDEPENDENT_AMBULATORY_CARE_PROVIDER_SITE_OTHER): Payer: PPO | Admitting: Adult Health Nurse Practitioner

## 2020-07-24 VITALS — BP 136/72 | HR 79 | Temp 96.9°F | Resp 16 | Wt 176.6 lb

## 2020-07-24 DIAGNOSIS — E1142 Type 2 diabetes mellitus with diabetic polyneuropathy: Secondary | ICD-10-CM | POA: Diagnosis not present

## 2020-07-24 DIAGNOSIS — E785 Hyperlipidemia, unspecified: Secondary | ICD-10-CM

## 2020-07-24 DIAGNOSIS — M6530 Trigger finger, unspecified finger: Secondary | ICD-10-CM

## 2020-07-24 DIAGNOSIS — F5101 Primary insomnia: Secondary | ICD-10-CM

## 2020-07-24 DIAGNOSIS — L02212 Cutaneous abscess of back [any part, except buttock]: Secondary | ICD-10-CM | POA: Diagnosis not present

## 2020-07-24 DIAGNOSIS — J449 Chronic obstructive pulmonary disease, unspecified: Secondary | ICD-10-CM | POA: Diagnosis not present

## 2020-07-24 DIAGNOSIS — I25709 Atherosclerosis of coronary artery bypass graft(s), unspecified, with unspecified angina pectoris: Secondary | ICD-10-CM | POA: Diagnosis not present

## 2020-07-24 DIAGNOSIS — K2100 Gastro-esophageal reflux disease with esophagitis, without bleeding: Secondary | ICD-10-CM | POA: Diagnosis not present

## 2020-07-24 DIAGNOSIS — E1169 Type 2 diabetes mellitus with other specified complication: Secondary | ICD-10-CM

## 2020-07-24 DIAGNOSIS — Z6824 Body mass index (BMI) 24.0-24.9, adult: Secondary | ICD-10-CM

## 2020-07-24 DIAGNOSIS — I951 Orthostatic hypotension: Secondary | ICD-10-CM

## 2020-07-24 DIAGNOSIS — I10 Essential (primary) hypertension: Secondary | ICD-10-CM

## 2020-07-24 DIAGNOSIS — N1831 Chronic kidney disease, stage 3a: Secondary | ICD-10-CM | POA: Diagnosis not present

## 2020-07-24 DIAGNOSIS — G214 Vascular parkinsonism: Secondary | ICD-10-CM

## 2020-07-24 DIAGNOSIS — E559 Vitamin D deficiency, unspecified: Secondary | ICD-10-CM

## 2020-07-24 DIAGNOSIS — Z79899 Other long term (current) drug therapy: Secondary | ICD-10-CM

## 2020-07-25 LAB — LIPID PANEL
Cholesterol: 173 mg/dL (ref <200)
HDL: 64 mg/dL (ref >=40)
LDL Cholesterol (Calc): 90 mg/dL (calc)
Non-HDL Cholesterol (Calc): 109 mg/dL (calc) (ref <130)
Total CHOL/HDL Ratio: 2.7 (calc) (ref <5.0)
Triglycerides: 95 mg/dL (ref <150)

## 2020-07-25 LAB — COMPLETE METABOLIC PANEL WITH GFR
AG Ratio: 1.6 (calc) (ref 1.0–2.5)
ALT: 17 U/L (ref 9–46)
AST: 13 U/L (ref 10–35)
Albumin: 3.9 g/dL (ref 3.6–5.1)
Alkaline phosphatase (APISO): 64 U/L (ref 35–144)
BUN/Creatinine Ratio: 10 (calc) (ref 6–22)
BUN: 13 mg/dL (ref 7–25)
CO2: 30 mmol/L (ref 20–32)
Calcium: 8.9 mg/dL (ref 8.6–10.3)
Chloride: 106 mmol/L (ref 98–110)
Creat: 1.26 mg/dL — ABNORMAL HIGH (ref 0.70–1.11)
GFR, Est African American: 61 mL/min/{1.73_m2} (ref >=60)
GFR, Est Non African American: 52 mL/min/{1.73_m2} — ABNORMAL LOW (ref >=60)
Globulin: 2.4 g/dL (calc) (ref 1.9–3.7)
Glucose, Bld: 102 mg/dL — ABNORMAL HIGH (ref 65–99)
Potassium: 4.3 mmol/L (ref 3.5–5.3)
Sodium: 144 mmol/L (ref 135–146)
Total Bilirubin: 0.4 mg/dL (ref 0.2–1.2)
Total Protein: 6.3 g/dL (ref 6.1–8.1)

## 2020-07-25 LAB — CBC WITH DIFFERENTIAL/PLATELET
Absolute Monocytes: 638 cells/uL (ref 200–950)
Basophils Absolute: 28 cells/uL (ref 0–200)
Basophils Relative: 0.5 %
Eosinophils Absolute: 242 cells/uL (ref 15–500)
Eosinophils Relative: 4.4 %
HCT: 39.8 % (ref 38.5–50.0)
Hemoglobin: 13.4 g/dL (ref 13.2–17.1)
Lymphs Abs: 1309 cells/uL (ref 850–3900)
MCH: 32.4 pg (ref 27.0–33.0)
MCHC: 33.7 g/dL (ref 32.0–36.0)
MCV: 96.1 fL (ref 80.0–100.0)
MPV: 10.4 fL (ref 7.5–12.5)
Monocytes Relative: 11.6 %
Neutro Abs: 3284 cells/uL (ref 1500–7800)
Neutrophils Relative %: 59.7 %
Platelets: 176 10*3/uL (ref 140–400)
RBC: 4.14 10*6/uL — ABNORMAL LOW (ref 4.20–5.80)
RDW: 13 % (ref 11.0–15.0)
Total Lymphocyte: 23.8 %
WBC: 5.5 10*3/uL (ref 3.8–10.8)

## 2020-07-28 ENCOUNTER — Other Ambulatory Visit: Payer: Self-pay | Admitting: Internal Medicine

## 2020-07-28 ENCOUNTER — Ambulatory Visit: Payer: PPO | Admitting: Internal Medicine

## 2020-07-28 DIAGNOSIS — N401 Enlarged prostate with lower urinary tract symptoms: Secondary | ICD-10-CM

## 2020-07-28 MED ORDER — DOXYCYCLINE HYCLATE 100 MG PO CAPS: ORAL_CAPSULE | ORAL | 0 refills | Status: DC

## 2020-07-29 ENCOUNTER — Other Ambulatory Visit: Payer: Self-pay

## 2020-07-29 ENCOUNTER — Ambulatory Visit (INDEPENDENT_AMBULATORY_CARE_PROVIDER_SITE_OTHER): Payer: PPO | Admitting: Internal Medicine

## 2020-07-29 VITALS — BP 104/56 | HR 86 | Temp 97.6°F | Resp 16 | Ht 71.0 in | Wt 178.0 lb

## 2020-07-29 DIAGNOSIS — L089 Local infection of the skin and subcutaneous tissue, unspecified: Secondary | ICD-10-CM | POA: Diagnosis not present

## 2020-07-29 DIAGNOSIS — L723 Sebaceous cyst: Secondary | ICD-10-CM | POA: Diagnosis not present

## 2020-07-29 DIAGNOSIS — L02212 Cutaneous abscess of back [any part, except buttock]: Secondary | ICD-10-CM

## 2020-07-29 DIAGNOSIS — I10 Essential (primary) hypertension: Secondary | ICD-10-CM

## 2020-07-29 NOTE — Progress Notes (Signed)
History of Present Illness:          This very nice 85 y.o. [place]   presenting emergently 5 days post I&D of a large multiloculated abscess of his mid back for increasing drainage and pain.  Patient was advised to continue Keflex after I&D.         Patient has been followed for HTN, ASCAD/CABG, HLD, T2_DM (diet), mild Dementia and Vitamin D Deficiency.   [[[   Excert from visit (63/78/5885) : Large area of erythema noted to right upper back above scapula - approx 12cm x 10cm firm, raised, Pinpoint opening and yellow exudate noted.  PROCEDURE NOTE: I&D of Abscess back  With firm pressure, discharge of copious amounts of malodorus green pus and serosanguinous fluid expressed.  Firm loculations? Palpated   ]      Medications  Current Outpatient Medications (Endocrine & Metabolic):  .  fludrocortisone (FLORINEF) 0.1 MG tablet, Take 1 tablet 2 x /day for Low BP  Current Outpatient Medications (Cardiovascular):  .  nitroGLYCERIN (NITROSTAT) 0.4 MG SL tablet, Sig: 1 tablet under tongue every 3 to 5 minutes as needed for Angina - Please Dispense # 2 bottles of # 25 tabs .  simvastatin (ZOCOR) 80 MG tablet, Take 1 tablet at Bedtime for Cholesterol    Current Outpatient Medications (Hematological):  .  clopidogrel (PLAVIX) 75 MG tablet, Take 1 tablet Daily to prevent Blood Clots  Current Outpatient Medications (Other):  [name]  ALPRAZolam (XANAX) 1 MG tablet, Take 1/2 to 1 tablet at Bedtime ONLY if needed for Sleep and please try to limit to 5 days /week to avoid addiction .  Cholecalciferol (VITAMIN D3) 2000 UNITS capsule, Take 8,000 Units by mouth daily.  [name]  doxycycline (VIBRAMYCIN) 100 MG capsule, Take 1 capsule 2 x /day with meals for Infection .  escitalopram (LEXAPRO) 10 MG tablet, Take 1 tablet Daily for Anxiety .  finasteride (PROSCAR) 5 MG tablet, TAKE 1 TABLET BY MOUTH ONCE DAILY FOR PROSTATE .  glucose blood (FREESTYLE LITE) test strip, TEST BLOOD SUGAR ONCE DAILY .  Lancets  (FREESTYLE) lancets, 1 each by Other route as needed for other. Use as instructed .  Melatonin 5 MG CAPS, Take 10 mg by mouth at bedtime.  .  mirtazapine (REMERON SOL-TAB) 30 MG disintegrating tablet, TAKE 1 TABLET BY MOUTH ONE HOUR BEFORE BEDTIME FOR APPETITE AND SLEEP .  ondansetron (ZOFRAN) 8 MG tablet, Take 1/2 to 1 tablet 3 x /day Only if needed for Nausea .  pantoprazole (PROTONIX) 40 MG tablet, TAKE ONE TABLET BY MOUTH ONCE DAILY .  potassium citrate (UROCIT-K) 10 MEQ (1080 MG) SR tablet, Take 1 tablet 2 x /day to Prevent Kidney Stones .  QUEtiapine (SEROQUEL) 100 MG tablet, Take 1 to 2 tablets at Bedtime for Sleep .  silodosin (RAPAFLO) 8 MG CAPS capsule, Take      1 capsule      at Bedtime      for Prostate .  vitamin C (ASCORBIC ACID) 500 MG tablet, Take 500 mg by mouth daily. [name]  zinc gluconate 50 MG tablet, Take 50 mg by mouth every other day. .  cephALEXin (KEFLEX) 500 MG capsule, Take    1 capsule       4 x /day       with Meals & Bedtime with Food for Skin Infection (Patient not taking: Reported on 07/29/2020)  Problem list He has Mixed hyperlipidemia; Essential hypertension; Atherosclerosis of coronary artery bypass graft of native  heart with angina pectoris ([place]); History of TIA (transient ischemic attack); Asthma; COPD (chronic obstructive pulmonary disease) ([place]); GERD; History of colonic polyps; Gastric AV malformation; Medication management; Vitamin D deficiency; CKD stage G3a/A1, GFR 45-59 and albumin creatinine ratio <30 mg/g (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   Observations/Objective:   BP 104/56   P 86   T 97.6 F    R 16   Ht 5\' 11"    Wt 178 lb   SpO2 95%   BMI 24.83   Skin focused exam of  upper Right mid back finds a raised draining multiloculated abscess draining purulent sero sanguinous fluid with apparent areas of loculated fluctuance..   Assessment and Plan:   1. Multiloculated Abscess of back  - Will request urgent surgical consultation  - Anticipate will require extensive I&D in out-patient surgery ctr  - Abx's changed empirically to Doxycycline 100 mg bid   Kirtland Bouchard, MD

## 2020-07-31 ENCOUNTER — Other Ambulatory Visit: Payer: Self-pay

## 2020-07-31 ENCOUNTER — Ambulatory Visit (INDEPENDENT_AMBULATORY_CARE_PROVIDER_SITE_OTHER): Payer: PPO | Admitting: *Deleted

## 2020-07-31 DIAGNOSIS — Z23 Encounter for immunization: Secondary | ICD-10-CM

## 2020-08-06 ENCOUNTER — Other Ambulatory Visit: Payer: Self-pay | Admitting: Internal Medicine

## 2020-08-06 DIAGNOSIS — F5101 Primary insomnia: Secondary | ICD-10-CM

## 2020-08-07 DIAGNOSIS — M65332 Trigger finger, left middle finger: Secondary | ICD-10-CM | POA: Diagnosis not present

## 2020-08-11 ENCOUNTER — Ambulatory Visit (INDEPENDENT_AMBULATORY_CARE_PROVIDER_SITE_OTHER): Payer: PPO | Admitting: Internal Medicine

## 2020-08-11 VITALS — BP 106/68 | HR 68 | Temp 97.6°F | Resp 12 | Ht 71.0 in | Wt 178.0 lb

## 2020-08-11 DIAGNOSIS — L723 Sebaceous cyst: Secondary | ICD-10-CM

## 2020-08-11 DIAGNOSIS — I10 Essential (primary) hypertension: Secondary | ICD-10-CM

## 2020-08-11 DIAGNOSIS — T8130XS Disruption of wound, unspecified, sequela: Secondary | ICD-10-CM

## 2020-08-12 ENCOUNTER — Other Ambulatory Visit: Payer: Self-pay

## 2020-08-16 ENCOUNTER — Encounter: Payer: Self-pay | Admitting: Internal Medicine

## 2020-08-16 NOTE — Progress Notes (Signed)
History of Present Illness:                                                              This very [age]AS36 y.o.WWM  ad I& D of  days post I&D of a large multiloculated abscess of his upper mid back for increasing drainage and pain on Oct 5 . Patient Had subsequent I& D at a surgical office and wound was packed with an iodoform packing & patient was advise d to remove the packing at home in 5 days.        In that the wound was not in a location that this 80 [place] could manage himself , he has returned to his office every 3-6 5 days for irrigating the large wound picket with H2O2 and Betadine soln.         Today, he presents for wound irrigation & Staged 2 degree closure of the wound. Patient has been taking keflex for the wound empirically.      Medications  Current Outpatient Medications (Endocrine & Metabolic):  .  fludrocortisone (FLORINEF) 0.1 MG tablet, Take 1 tablet 2 x /day for Low BP  Current Outpatient Medications (Cardiovascular):  .  nitroGLYCERIN (NITROSTAT) 0.4 MG SL tablet, Sig: 1 tablet under tongue every 3 to 5 minutes as needed for Angina - Please Dispense # 2 bottles of # 25 tabs .  simvastatin (ZOCOR) 80 MG tablet, Take 1 tablet at Bedtime for Cholesterol    Current Outpatient Medications (Hematological):  .  clopidogrel (PLAVIX) 75 MG tablet, Take 1 tablet Daily to prevent Blood Clots  Current Outpatient Medications (Other):  [name]  ALPRAZolam (XANAX) 1 MG tablet, Take 1/2 to 1 tablet at Bedtime ONLY if needed for Sleep and please try to limit to 5 days /week to avoid addiction .  cephALEXin (KEFLEX) 500 MG capsule, Take    1 capsule       4 x /day       with Meals & Bedtime with Food for Skin Infection (Patient not taking: Reported on 07/29/2020) .  Cholecalciferol (VITAMIN D3) 2000 UNITS capsule, Take 8,000 Units by mouth daily.  [name]  doxycycline (VIBRAMYCIN) 100 MG capsule, Take 1 capsule 2 x /day with meals for Infection .  escitalopram (LEXAPRO) 10 MG tablet, Take 1  tablet Daily for Anxiety .  finasteride (PROSCAR) 5 MG tablet, TAKE 1 TABLET BY MOUTH ONCE DAILY FOR PROSTATE .  glucose blood (FREESTYLE LITE) test strip, TEST BLOOD SUGAR ONCE DAILY .  Lancets (FREESTYLE) lancets, 1 each by Other route as needed for other. Use as instructed .  Melatonin 5 MG CAPS, Take 10 mg by mouth at bedtime.  .  mirtazapine (REMERON SOL-TAB) 30 MG disintegrating tablet, TAKE 1 TABLET BY MOUTH ONE HOUR BEFORE BEDTIME FOR APPETITE AND SLEEP .  ondansetron (ZOFRAN) 8 MG tablet, Take 1/2 to 1 tablet 3 x /day Only if needed for Nausea .  pantoprazole (PROTONIX) 40 MG tablet, TAKE ONE TABLET BY MOUTH ONCE DAILY .  potassium citrate (UROCIT-K) 10 MEQ (1080 MG) SR tablet, Take 1 tablet 2 x /day to Prevent Kidney Stones .  QUEtiapine (SEROQUEL) 100 MG tablet, Take    1 to 2 tablets    at Bedtime      as needed  for Sleep .  silodosin (RAPAFLO) 8 MG CAPS capsule, Take      1 capsule      at Bedtime      for Prostate .  vitamin C (ASCORBIC ACID) 500 MG tablet, Take 500 mg by mouth daily. [name]  zinc gluconate 50 MG tablet, Take 50 mg by mouth every other day.  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); Insomnia; Acute medial meniscus tear of left knee; 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[place]); Adrenal insufficiency ([place]); Primary osteoarthritis of left knee; and S/P left knee arthroscopy on their problem list.   Observations/Objective:  BP 106/68  P 68   T 97.6 F    R 12   Ht 5\' 11"    Wt 178 lb    SpO2 95%   BMI 24.83    Procedure ( CPT: 66294)         Examination of the back wound finds a 2.5 " transverse gaping wound of the upper mid back. After wound packing was removed, the wound cavity was irrigated with H2O2 and then Betadine.  After informed consent and aseptic prep as above, the wound edges were sharply trimmed & debrided of necrotic tissueWound edges were undermined approximately 1/2 " circumferentially. Remnants of a thick shiney white sebaceous cyst  Capsule were meticulous sharply trimmed & debrided . Then the opposing wound edges were approximated and  secured with  # 5 wide vertical mattress sutures of Nylon 3-0 to evert the wound edges and collapse the wound cavity. Then the wound edges were further aligned & everted with  # 8 interrupted sutures of Nylon 3-0. #Wound was dressed with Abx ung and guaze and covered with a 4" x 6" Tegaderm and patient was instructed to return in 3 days for wound recheck     Assessment and Plan:  1. Essential hypertension   2. Dehiscence of wound of skin, sequela   Follow Up Instructions:        I discussed the assessment and treatment plan with the patient. The patient was provided an opportunity to ask questions and all were answered. The patient agreed with the plan and demonstrated an understanding of the instructions.   Kirtland Bouchard, MD

## 2020-08-26 ENCOUNTER — Other Ambulatory Visit: Payer: Self-pay | Admitting: Internal Medicine

## 2020-08-26 MED ORDER — CEPHALEXIN 500 MG PO CAPS: ORAL_CAPSULE | ORAL | 0 refills | Status: DC

## 2020-09-01 ENCOUNTER — Other Ambulatory Visit: Payer: Self-pay | Admitting: Internal Medicine

## 2020-09-01 DIAGNOSIS — F419 Anxiety disorder, unspecified: Secondary | ICD-10-CM

## 2020-09-24 ENCOUNTER — Other Ambulatory Visit: Payer: Self-pay | Admitting: Internal Medicine

## 2020-09-24 ENCOUNTER — Other Ambulatory Visit: Payer: Self-pay | Admitting: Adult Health

## 2020-09-24 DIAGNOSIS — F419 Anxiety disorder, unspecified: Secondary | ICD-10-CM

## 2020-09-24 MED ORDER — ALPRAZOLAM 1 MG PO TABS: ORAL_TABLET | ORAL | 0 refills | Status: DC

## 2020-10-28 ENCOUNTER — Other Ambulatory Visit: Payer: Self-pay | Admitting: Adult Health

## 2020-10-28 ENCOUNTER — Other Ambulatory Visit: Payer: Self-pay | Admitting: Internal Medicine

## 2020-10-28 DIAGNOSIS — N138 Other obstructive and reflux uropathy: Secondary | ICD-10-CM

## 2020-10-28 DIAGNOSIS — N401 Enlarged prostate with lower urinary tract symptoms: Secondary | ICD-10-CM

## 2020-10-31 ENCOUNTER — Other Ambulatory Visit: Payer: Self-pay | Admitting: Internal Medicine

## 2020-10-31 DIAGNOSIS — F5101 Primary insomnia: Secondary | ICD-10-CM

## 2020-11-27 ENCOUNTER — Other Ambulatory Visit: Payer: Self-pay | Admitting: Internal Medicine

## 2020-11-27 DIAGNOSIS — F419 Anxiety disorder, unspecified: Secondary | ICD-10-CM

## 2020-12-11 ENCOUNTER — Other Ambulatory Visit: Payer: Self-pay

## 2020-12-11 ENCOUNTER — Encounter: Payer: Self-pay | Admitting: Cardiovascular Disease

## 2020-12-11 ENCOUNTER — Ambulatory Visit: Payer: PPO | Admitting: Cardiovascular Disease

## 2020-12-11 VITALS — BP 110/60 | HR 91 | Ht 71.0 in | Wt 169.8 lb

## 2020-12-11 DIAGNOSIS — I2510 Atherosclerotic heart disease of native coronary artery without angina pectoris: Secondary | ICD-10-CM | POA: Diagnosis not present

## 2020-12-11 DIAGNOSIS — E7800 Pure hypercholesterolemia, unspecified: Secondary | ICD-10-CM | POA: Diagnosis not present

## 2020-12-11 DIAGNOSIS — I6523 Occlusion and stenosis of bilateral carotid arteries: Secondary | ICD-10-CM

## 2020-12-11 DIAGNOSIS — I351 Nonrheumatic aortic (valve) insufficiency: Secondary | ICD-10-CM

## 2020-12-11 DIAGNOSIS — I491 Atrial premature depolarization: Secondary | ICD-10-CM | POA: Diagnosis not present

## 2020-12-11 NOTE — Progress Notes (Signed)
Chief Complaint  Patient presents with  . Follow-up    CAD   History of Present Illness: 86 yo male with history of CAD s/p 2 V CABG 1996 , DM, HLD, TIA and SVT here today for cardiac follow up. Carotid dopplers December 2019 with mild bilateral disease. He was admitted to [facility] in November 2016 and diagnosed with Parkinson's disease. Last cardiac cath in March 2018 in the setting of chest pain and this showed 70% proximal LAD stenosis with patent LIMA graft to LAD and Diagonal. Mild disease in RCA. The Circumflex was normal. LVEF was normal. Cardiac monitor September 2018 with sinus rhythm, PACs and blocked PACs. Most recent echo November 2020 with LVEF=50-55%, mild LVH. Grade 1 diastolic dysfunction. Mild to moderate AI.   He is here today for follow up. The patient denies any chest pain, dyspnea, palpitations, lower extremity edema, orthopnea, PND, dizziness, near syncope or syncope.   Primary Care Physician: Unk Pinto, MD  Past Medical History:  Diagnosis Date  . Anxiety   . Arthritis    "lower back; left ankle" (03/20/2014)  . ASTHMA   . Blood transfusion    "related to OHS, ulcers"  . CAD (coronary artery disease)    a. s/p 2V CABG 1996. b. Last cath 12/2016 -> patent grafts, med rx.  . CAD, ARTERY BYPASS GRAFT   . Cataract   . CKD (chronic kidney disease), stage II   . Colon polyps    adenomatous polyps  . COPD   . Depression   . Diabetes mellitus without complication (HCC)    no meds now  . Diverticulosis   . Duodenal ulcer without hemorrhage or perforation 12/29/2011  . GASTROESOPHAGEAL REFLUX DISEASE   . HYPERLIPIDEMIA   . Insomnia   . 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 Memory loss   . Nephrolithiasis    "I've had over 320 kidney stones" (03/20/2014)  . NEPHROLITHIASIS, HX OF 03/12/2009  . Parkinsonism ([place])   . Sleep apnea    does not use CPAP  . Stroke [facility])    TIA  . SUPRAVENTRICULAR TACHYCARDIA, HX OF   . SVT (supraventricular tachycardia) ([place]) 03/12/2009  .  TIA   . Ulcer     Past Surgical History:  Procedure Laterality Date  . ANKLE FRACTURE SURGERY Left ~ 2012  . CARDIAC CATHETERIZATION    . COLONOSCOPY    . CORONARY ARTERY BYPASS GRAFT  1996   CABG X2  . CYSTOSCOPY WITH URETEROSCOPY, STONE BASKETRY AND STENT PLACEMENT    . Cystourethroscopy with stent removal.    . EXCISIONAL HEMORRHOIDECTOMY    . GASTRECTOMY    . HIATAL HERNIA REPAIR    . IRRIGATION AND DEBRIDEMENT SEBACEOUS CYST     "off my back  . KNEE ARTHROSCOPY WITH MEDIAL MENISECTOMY Left 08/24/2019   Procedure: LEFT KNEE ARTHROSCOPY WITH PARTIAL MEDIAL MENISCECTOMY;  Surgeon: Leandrew Koyanagi, MD;  Location: [place];  Service: Orthopedics;  Laterality: Left;  . LEFT HEART CATH AND CORS/GRAFTS ANGIOGRAPHY N/A 12/23/2016   Procedure: Left Heart Cath and Cors/Grafts Angiography;  Surgeon: Burnell Blanks, MD;  Location: [place] [facility];  Service: Cardiovascular;  Laterality: N/A;  . POLYPECTOMY    . PROSTATE SURGERY     "took the center of my prostate out"  . UPPER GASTROINTESTINAL ENDOSCOPY    . VAGOTOMY      Current Outpatient Medications  Medication Sig Dispense Refill  . ALPRAZolam (XANAX) 1 MG tablet Take  1/2 to 1 tablet      at Bedtime      ONLY        if needed for Sleep and please try to limit to 5 days /week to avoid addiction 90 tablet 0  . cephALEXin (KEFLEX) 500 MG capsule Take      1 capsule    3 x /day     with Food        for Skin Infection 60 capsule 0  . Cholecalciferol (VITAMIN D3) 2000 UNITS capsule Take 8,000 Units by mouth daily.    . clopidogrel (PLAVIX) 75 MG tablet Take 1 tablet Daily to prevent Blood Clots 90 tablet 3  . escitalopram (LEXAPRO) 10 MG tablet Take  1 tablet  Daily  for Mood & Anxiety 90 tablet 0  . finasteride (PROSCAR) 5 MG tablet Take      1 tablet       Daily       for Prostate 90 tablet 0  . fludrocortisone (FLORINEF) 0.1 MG tablet Take 1 tablet 2 x /day for Low BP 180 tablet 3  . glucose blood (FREESTYLE  LITE) test strip TEST BLOOD SUGAR ONCE DAILY 100 strip 3  . Lancets (FREESTYLE) lancets 1 each by Other route as needed for other. Use as instructed    . Melatonin 5 MG CAPS Take 10 mg by mouth at bedtime.     . mirtazapine (REMERON SOL-TAB) 30 MG disintegrating tablet Take     1 tablet      at Bedtime       for Appetite & Sleep 90 tablet 0  . nitroGLYCERIN (NITROSTAT) 0.4 MG SL tablet Sig: 1 tablet under tongue every 3 to 5 minutes as needed for Angina - Please Dispense # 2 bottles of # 25 tabs 50 tablet 6  . ondansetron (ZOFRAN) 8 MG tablet Take 1/2 to 1 tablet 3 x /day Only if needed for Nausea 30 tablet 0  . pantoprazole (PROTONIX) 40 MG tablet TAKE ONE TABLET BY MOUTH ONCE DAILY 90 tablet 3  . potassium citrate (UROCIT-K) 10 MEQ (1080 MG) SR tablet Take 1 tablet 2 x /day to Prevent Kidney Stones 180 tablet 3  . QUEtiapine (SEROQUEL) 100 MG tablet TAKE 1 TO 2 TABLETS BY MOUTH AT BEDTIME AS NEEDED FOR SLEEP 180 tablet 0  . silodosin (RAPAFLO) 8 MG CAPS capsule Take        1 capsule        at Bedtime       for Prostate 90 capsule 0  . simvastatin (ZOCOR) 80 MG tablet Take 1 tablet at Bedtime for Cholesterol 90 tablet 3  . vitamin C (ASCORBIC ACID) 500 MG tablet Take 500 mg by mouth daily.    [name] zinc gluconate 50 MG tablet Take 50 mg by mouth every other day.     No current facility-administered medications for this visit.    Allergies  Allergen Reactions  . Gabapentin Other (See Comments)    Severe confusion  . Prozac [Fluoxetine Hcl] Other (See Comments)    Patient state that it does not agree with him  . Soma [Carisoprodol] Other (See Comments)    Patient stated that it does not agree with him  . Beta Adrenergic Blockers Other (See Comments)    REACTION: Bradycardia    Social History   Socioeconomic History  . Marital status: Widowed    Spouse name: Not on file  . Number of children: 2  . Years of  education: 14  . Highest education level: Not on file  Occupational History  .  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 Smoking status: Former Smoker    Packs/day: 0.50    Types: Cigarettes    Quit date: 10/25/1962    Years since quitting: 58.1  . Smokeless tobacco: Former Systems developer    Types: Chew    Quit date: 04/15/2018  Vaping Use  . Vaping Use: Never used  Substance and Sexual Activity  . Alcohol use: No    Alcohol/week: 0.0 standard drinks  . Drug use: No  . Sexual activity: Yes  Other Topics Concern  . Not on file  Social History Narrative   Denies caffeine use    Social Determinants of Health   Financial Resource Strain: Not on file  Food Insecurity: Not on file  Transportation Needs: Not on file  Physical Activity: Not on file  Stress: Not on file  Social Connections: Not on file  Intimate Partner Violence: Not on file    Family History  Problem Relation Age of Onset  . Alcohol abuse Father   . Stroke Mother   . Heart disease Brother   . Diabetes Brother   . Heart disease Paternal Grandmother   . Colon cancer Neg Hx   . Stomach cancer Neg Hx   . Esophageal cancer Neg Hx   . Rectal cancer Neg Hx     Review of Systems:  As stated in the HPI and otherwise negative.   BP 110/60   Pulse 91   Ht 5\' 11"  (1.803 m)   Wt 169 lb 12.8 oz (77 kg)   SpO2 95%   BMI 23.68 kg/m   Physical Examination:  General: Well developed, well nourished, NAD  HEENT: OP clear, mucus membranes moist  SKIN: warm, dry. No rashes. Neuro: No focal deficits  Musculoskeletal: Muscle strength 5/5 all ext  Psychiatric: Mood and affect normal  Neck: No JVD, no carotid bruits, no thyromegaly, no lymphadenopathy.  Lungs:Clear bilaterally, no wheezes, rhonci, crackles Cardiovascular: Regular rate and rhythm. Systolic murmur.  Abdomen:Soft. Bowel sounds present. Non-tender.  Extremities: No lower extremity edema. Pulses are 2 + in the bilateral DP/PT.  Cardiac cath 12/23/16:  Prox LAD to Mid LAD lesion, 70  %stenosed.  Prox RCA lesion, 30 %stenosed.  Mid RCA lesion, 20 %stenosed.  LIMA graft was visualized by angiography and is normal in caliber and anatomically normal.  The left ventricular systolic function is normal.  LV end diastolic pressure is normal.  The left ventricular ejection fraction is 55-65% by visual estimate.  There is no mitral valve regurgitation.   1. Single vessel CAD s/p 2V CABG 2. Moderately severe stenosis mid LAD with patent LIMA to LAD and Diagonal. There is antegrade flow down the LAD and competitive flow from the graft.  3. Mild disease in the RCA 4. Normal LV systolic function Coronary Diagrams   Diagnostic Diagram          Echo 09/13/19:  1. Left ventricular ejection fraction, by visual estimation, is 50 to 55%. The left ventricle has normal function. There is mildly increased left ventricular hypertrophy.  2. Left ventricular diastolic parameters are consistent with Grade I diastolic dysfunction (impaired relaxation).  3. Global right ventricle has normal systolic function.The right ventricular size is normal.  4. Left atrial size was normal.  5. Right atrial size was normal.  6. Mild mitral annular calcification.  7. The mitral valve  is normal in structure. Trace mitral valve regurgitation. No evidence of mitral stenosis.  8. The tricuspid valve is normal in structure. Tricuspid valve regurgitation is trivial.  9. The aortic valve is tricuspid. Aortic valve regurgitation is mild. Mild aortic valve sclerosis without stenosis. 10. The pulmonic valve was normal in structure. Pulmonic valve regurgitation is mild. 11. Low normal LV systolic function; grade 1 diastolic dysfunction; mild LVH with proximal septal thickening; mild AI.  EKG:  EKG is ordered today. EKG is personally reviewed and shows sinus  Recent Labs: 01/15/2020: Magnesium 1.8 04/09/2020: TSH 0.79 07/24/2020: ALT 17; BUN 13; Creat 1.26; Hemoglobin 13.4; Platelets 176; Potassium 4.3;  Sodium 144   Lipid Panel    Component Value Date/Time   CHOL 173 07/24/2020 1233   TRIG 95 07/24/2020 1233   HDL 64 07/24/2020 1233   CHOLHDL 2.7 07/24/2020 1233   VLDL 16 05/03/2017 1520   LDLCALC 90 07/24/2020 1233     Wt Readings from Last 3 Encounters:  12/11/20 169 lb 12.8 oz (77 kg)  08/16/20 178 lb (80.7 kg)  07/29/20 178 lb (80.7 kg)    Other studies Reviewed: Additional studies/ records that were reviewed today include:. Review of the above records demonstrates:    Assessment and Plan:   1.CAD without angina: Cardiac cath March 2018 with stable CAD, patent LIMA to LAD/Diagonal but also antegrade flow through the LAD with 70% proximal LAD stenosis. He has no chest pain. Continue Plavix and statin. He does not tolerate beta blockers due to fatigue.    2. HLD: Lipids followed in primary care. Continue statin  3. Carotid artery disease: Mild bilateral carotid artery disease by dopplers December 2019.   4. PACs/PVCs: He has no palpitations.   5. Aortic valve insufficiency: Mild to moderate by echo November 2020. Repeat echo in November 2022.    Current medicines are reviewed at length with the patient today.  The patient does not have concerns regarding medicines.  The following changes have been made:  no change  Labs/ tests ordered today include: None  Orders Placed This Encounter  Procedures  . EKG 12-Lead  . ECHOCARDIOGRAM COMPLETE    Disposition:   FU with me in 12 months.   Signed, Lauree Chandler, MD 12/11/2020 4:11 PM    [facility] [address], [place], [place]  [address] Phone: [phone]; Fax: [phone]

## 2020-12-11 NOTE — Patient Instructions (Signed)
Medication Instructions:  NO CHANGES *If you need a refill on your cardiac medications before your next appointment, please call your pharmacy*   Lab Work: NONE If you have labs (blood work) drawn today and your tests are completely normal, you will receive your results only by: [name] MyChart Message (if you have MyChart) OR . A paper copy in the mail If you have any lab test that is abnormal or we need to change your treatment, we will call you to review the results.   Testing/Procedures:  DUE IN NOVEMBER Your physician has requested that you have an echocardiogram. Echocardiography is a painless test that uses sound waves to create images of your heart. It provides your doctor with information about the size and shape of your heart and how well your heart's chambers and valves are working. This procedure takes approximately one hour. There are no restrictions for this procedure.   Follow-Up: At [facility], you and your health needs are our priority.  As part of our continuing mission to provide you with exceptional heart care, we have created designated Provider Care Teams.  These Care Teams include your primary Cardiologist (physician) and Advanced Practice Providers (APPs -  Physician Assistants and Nurse Practitioners) who all work together to provide you with the care you need, when you need it.  We recommend signing up for the patient portal called "MyChart".  Sign up information is provided on this After Visit Summary.  MyChart is used to connect with patients for Virtual Visits (Telemedicine).  Patients are able to view lab/test results, encounter notes, upcoming appointments, etc.  Non-urgent messages can be sent to your provider as well.   To learn more about what you can do with MyChart, go to [email or web address].    Your next appointment:   12 month(s)  The format for your next appointment:   In Person  Provider:   You may see Lauree Chandler, MD or one of the  following Advanced Practice Providers on your designated Care Team:    Melina Copa, PA-C  Ermalinda Barrios, PA-C

## 2020-12-24 ENCOUNTER — Other Ambulatory Visit: Payer: Self-pay | Admitting: Internal Medicine

## 2021-01-15 ENCOUNTER — Other Ambulatory Visit: Payer: Self-pay | Admitting: Internal Medicine

## 2021-01-15 ENCOUNTER — Other Ambulatory Visit: Payer: Self-pay

## 2021-01-15 ENCOUNTER — Encounter (HOSPITAL_BASED_OUTPATIENT_CLINIC_OR_DEPARTMENT_OTHER): Payer: Self-pay | Admitting: Emergency Medicine

## 2021-01-15 ENCOUNTER — Inpatient Hospital Stay (HOSPITAL_BASED_OUTPATIENT_CLINIC_OR_DEPARTMENT_OTHER)
Admission: EM | Admit: 2021-01-15 | Discharge: 2021-01-19 | DRG: 083 | Disposition: A | Discharge status: Home Health | Payer: PPO | Attending: Internal Medicine | Admitting: Internal Medicine

## 2021-01-15 ENCOUNTER — Emergency Department (HOSPITAL_BASED_OUTPATIENT_CLINIC_OR_DEPARTMENT_OTHER): Payer: PPO | Admitting: Radiology

## 2021-01-15 ENCOUNTER — Emergency Department (HOSPITAL_BASED_OUTPATIENT_CLINIC_OR_DEPARTMENT_OTHER): Payer: PPO

## 2021-01-15 DIAGNOSIS — R059 Cough, unspecified: Secondary | ICD-10-CM | POA: Diagnosis not present

## 2021-01-15 DIAGNOSIS — R627 Adult failure to thrive: Secondary | ICD-10-CM | POA: Diagnosis present

## 2021-01-15 DIAGNOSIS — G4700 Insomnia, unspecified: Secondary | ICD-10-CM | POA: Diagnosis present

## 2021-01-15 DIAGNOSIS — S066X0A Traumatic subarachnoid hemorrhage without loss of consciousness, initial encounter: Secondary | ICD-10-CM | POA: Diagnosis not present

## 2021-01-15 DIAGNOSIS — I9589 Other hypotension: Secondary | ICD-10-CM | POA: Diagnosis not present

## 2021-01-15 DIAGNOSIS — Z833 Family history of diabetes mellitus: Secondary | ICD-10-CM

## 2021-01-15 DIAGNOSIS — I609 Nontraumatic subarachnoid hemorrhage, unspecified: Secondary | ICD-10-CM | POA: Insufficient documentation

## 2021-01-15 DIAGNOSIS — F419 Anxiety disorder, unspecified: Secondary | ICD-10-CM | POA: Diagnosis present

## 2021-01-15 DIAGNOSIS — S06369A Traumatic hemorrhage of cerebrum, unspecified, with loss of consciousness of unspecified duration, initial encounter: Secondary | ICD-10-CM

## 2021-01-15 DIAGNOSIS — E785 Hyperlipidemia, unspecified: Secondary | ICD-10-CM | POA: Diagnosis present

## 2021-01-15 DIAGNOSIS — H109 Unspecified conjunctivitis: Secondary | ICD-10-CM | POA: Diagnosis not present

## 2021-01-15 DIAGNOSIS — Z79899 Other long term (current) drug therapy: Secondary | ICD-10-CM

## 2021-01-15 DIAGNOSIS — G20 Parkinson's disease: Secondary | ICD-10-CM | POA: Diagnosis present

## 2021-01-15 DIAGNOSIS — I2510 Atherosclerotic heart disease of native coronary artery without angina pectoris: Secondary | ICD-10-CM | POA: Diagnosis present

## 2021-01-15 DIAGNOSIS — Z951 Presence of aortocoronary bypass graft: Secondary | ICD-10-CM

## 2021-01-15 DIAGNOSIS — F0280 Dementia in other diseases classified elsewhere without behavioral disturbance: Secondary | ICD-10-CM | POA: Diagnosis not present

## 2021-01-15 DIAGNOSIS — I169 Hypertensive crisis, unspecified: Secondary | ICD-10-CM | POA: Diagnosis not present

## 2021-01-15 DIAGNOSIS — M6282 Rhabdomyolysis: Secondary | ICD-10-CM | POA: Diagnosis not present

## 2021-01-15 DIAGNOSIS — K219 Gastro-esophageal reflux disease without esophagitis: Secondary | ICD-10-CM | POA: Diagnosis not present

## 2021-01-15 DIAGNOSIS — S0990XA Unspecified injury of head, initial encounter: Secondary | ICD-10-CM | POA: Diagnosis not present

## 2021-01-15 DIAGNOSIS — Z043 Encounter for examination and observation following other accident: Secondary | ICD-10-CM | POA: Diagnosis not present

## 2021-01-15 DIAGNOSIS — I6782 Cerebral ischemia: Secondary | ICD-10-CM | POA: Diagnosis not present

## 2021-01-15 DIAGNOSIS — E1122 Type 2 diabetes mellitus with diabetic chronic kidney disease: Secondary | ICD-10-CM | POA: Diagnosis present

## 2021-01-15 DIAGNOSIS — I616 Nontraumatic intracerebral hemorrhage, multiple localized: Secondary | ICD-10-CM | POA: Diagnosis not present

## 2021-01-15 DIAGNOSIS — S066X9A Traumatic subarachnoid hemorrhage with loss of consciousness of unspecified duration, initial encounter: Secondary | ICD-10-CM | POA: Diagnosis not present

## 2021-01-15 DIAGNOSIS — K449 Diaphragmatic hernia without obstruction or gangrene: Secondary | ICD-10-CM | POA: Diagnosis present

## 2021-01-15 DIAGNOSIS — I672 Cerebral atherosclerosis: Secondary | ICD-10-CM | POA: Diagnosis not present

## 2021-01-15 DIAGNOSIS — Z888 Allergy status to other drugs, medicaments and biological substances status: Secondary | ICD-10-CM

## 2021-01-15 DIAGNOSIS — H1033 Unspecified acute conjunctivitis, bilateral: Secondary | ICD-10-CM | POA: Diagnosis not present

## 2021-01-15 DIAGNOSIS — Z20822 Contact with and (suspected) exposure to covid-19: Secondary | ICD-10-CM | POA: Diagnosis present

## 2021-01-15 DIAGNOSIS — W0110XA Fall on same level from slipping, tripping and stumbling with subsequent striking against unspecified object, initial encounter: Secondary | ICD-10-CM | POA: Diagnosis present

## 2021-01-15 DIAGNOSIS — E2740 Unspecified adrenocortical insufficiency: Secondary | ICD-10-CM | POA: Diagnosis not present

## 2021-01-15 DIAGNOSIS — S06320A Contusion and laceration of left cerebrum without loss of consciousness, initial encounter: Secondary | ICD-10-CM | POA: Diagnosis not present

## 2021-01-15 DIAGNOSIS — I351 Nonrheumatic aortic (valve) insufficiency: Secondary | ICD-10-CM | POA: Diagnosis not present

## 2021-01-15 DIAGNOSIS — R531 Weakness: Secondary | ICD-10-CM | POA: Diagnosis not present

## 2021-01-15 DIAGNOSIS — F32A Depression, unspecified: Secondary | ICD-10-CM | POA: Diagnosis present

## 2021-01-15 DIAGNOSIS — M159 Polyosteoarthritis, unspecified: Secondary | ICD-10-CM | POA: Diagnosis not present

## 2021-01-15 DIAGNOSIS — I629 Nontraumatic intracranial hemorrhage, unspecified: Secondary | ICD-10-CM | POA: Diagnosis not present

## 2021-01-15 DIAGNOSIS — J449 Chronic obstructive pulmonary disease, unspecified: Secondary | ICD-10-CM | POA: Diagnosis present

## 2021-01-15 DIAGNOSIS — Z7951 Long term (current) use of inhaled steroids: Secondary | ICD-10-CM

## 2021-01-15 DIAGNOSIS — Z8249 Family history of ischemic heart disease and other diseases of the circulatory system: Secondary | ICD-10-CM

## 2021-01-15 DIAGNOSIS — N400 Enlarged prostate without lower urinary tract symptoms: Secondary | ICD-10-CM | POA: Diagnosis not present

## 2021-01-15 DIAGNOSIS — S06310A Contusion and laceration of right cerebrum without loss of consciousness, initial encounter: Secondary | ICD-10-CM | POA: Diagnosis not present

## 2021-01-15 DIAGNOSIS — N179 Acute kidney failure, unspecified: Secondary | ICD-10-CM | POA: Diagnosis present

## 2021-01-15 DIAGNOSIS — Z7902 Long term (current) use of antithrombotics/antiplatelets: Secondary | ICD-10-CM

## 2021-01-15 DIAGNOSIS — E876 Hypokalemia: Secondary | ICD-10-CM | POA: Diagnosis present

## 2021-01-15 DIAGNOSIS — Z8673 Personal history of transient ischemic attack (TIA), and cerebral infarction without residual deficits: Secondary | ICD-10-CM

## 2021-01-15 DIAGNOSIS — I619 Nontraumatic intracerebral hemorrhage, unspecified: Secondary | ICD-10-CM | POA: Diagnosis present

## 2021-01-15 DIAGNOSIS — I129 Hypertensive chronic kidney disease with stage 1 through stage 4 chronic kidney disease, or unspecified chronic kidney disease: Secondary | ICD-10-CM | POA: Diagnosis present

## 2021-01-15 DIAGNOSIS — I951 Orthostatic hypotension: Secondary | ICD-10-CM | POA: Diagnosis not present

## 2021-01-15 DIAGNOSIS — E860 Dehydration: Secondary | ICD-10-CM | POA: Diagnosis present

## 2021-01-15 DIAGNOSIS — Z87891 Personal history of nicotine dependence: Secondary | ICD-10-CM

## 2021-01-15 DIAGNOSIS — G936 Cerebral edema: Secondary | ICD-10-CM | POA: Diagnosis not present

## 2021-01-15 DIAGNOSIS — N182 Chronic kidney disease, stage 2 (mild): Secondary | ICD-10-CM | POA: Diagnosis not present

## 2021-01-15 DIAGNOSIS — E782 Mixed hyperlipidemia: Secondary | ICD-10-CM | POA: Diagnosis present

## 2021-01-15 DIAGNOSIS — D638 Anemia in other chronic diseases classified elsewhere: Secondary | ICD-10-CM | POA: Diagnosis present

## 2021-01-15 DIAGNOSIS — E1142 Type 2 diabetes mellitus with diabetic polyneuropathy: Secondary | ICD-10-CM | POA: Diagnosis present

## 2021-01-15 LAB — CBC WITH DIFFERENTIAL/PLATELET
Abs Immature Granulocytes: 0.02 10*3/uL (ref 0.00–0.07)
Basophils Absolute: 0 10*3/uL (ref 0.0–0.1)
Basophils Relative: 1 %
Eosinophils Absolute: 0 10*3/uL (ref 0.0–0.5)
Eosinophils Relative: 0 %
HCT: 41.8 % (ref 39.0–52.0)
Hemoglobin: 14 g/dL (ref 13.0–17.0)
Immature Granulocytes: 0 %
Lymphocytes Relative: 17 %
Lymphs Abs: 1.4 10*3/uL (ref 0.7–4.0)
MCH: 32.6 pg (ref 26.0–34.0)
MCHC: 33.5 g/dL (ref 30.0–36.0)
MCV: 97.2 fL (ref 80.0–100.0)
Monocytes Absolute: 0.7 10*3/uL (ref 0.1–1.0)
Monocytes Relative: 8 %
Neutro Abs: 6.5 10*3/uL (ref 1.7–7.7)
Neutrophils Relative %: 74 %
Platelets: 215 10*3/uL (ref 150–400)
RBC: 4.3 MIL/uL (ref 4.22–5.81)
RDW: 13.8 % (ref 11.5–15.5)
WBC: 8.7 10*3/uL (ref 4.0–10.5)
nRBC: 0 % (ref 0.0–0.2)

## 2021-01-15 LAB — LACTIC ACID, PLASMA: Lactic Acid, Venous: 1.4 mmol/L (ref 0.5–1.9)

## 2021-01-15 LAB — COMPREHENSIVE METABOLIC PANEL
ALT: 21 U/L (ref 0–44)
AST: 56 U/L — ABNORMAL HIGH (ref 15–41)
Albumin: 4 g/dL (ref 3.5–5.0)
Alkaline Phosphatase: 59 U/L (ref 38–126)
Anion gap: 10 (ref 5–15)
BUN: 18 mg/dL (ref 8–23)
CO2: 24 mmol/L (ref 22–32)
Calcium: 8.7 mg/dL — ABNORMAL LOW (ref 8.9–10.3)
Chloride: 108 mmol/L (ref 98–111)
Creatinine, Ser: 1.68 mg/dL — ABNORMAL HIGH (ref 0.61–1.24)
GFR, Estimated: 40 mL/min — ABNORMAL LOW (ref >60)
Glucose, Bld: 129 mg/dL — ABNORMAL HIGH (ref 70–99)
Potassium: 3.9 mmol/L (ref 3.5–5.1)
Sodium: 142 mmol/L (ref 135–145)
Total Bilirubin: 0.5 mg/dL (ref 0.3–1.2)
Total Protein: 6.3 g/dL — ABNORMAL LOW (ref 6.5–8.1)

## 2021-01-15 LAB — I-STAT VENOUS BLOOD GAS, ED
Acid-Base Excess: 0 mmol/L (ref 0.0–2.0)
Bicarbonate: 24.1 mmol/L (ref 20.0–28.0)
Calcium, Ion: 1.21 mmol/L (ref 1.15–1.40)
HCT: 38 % — ABNORMAL LOW (ref 39.0–52.0)
Hemoglobin: 12.9 g/dL — ABNORMAL LOW (ref 13.0–17.0)
O2 Saturation: 35 %
Patient temperature: 99.8
Potassium: 4.1 mmol/L (ref 3.5–5.1)
Sodium: 141 mmol/L (ref 135–145)
TCO2: 25 mmol/L (ref 22–32)
pCO2, Ven: 38.4 mmHg — ABNORMAL LOW (ref 44.0–60.0)
pH, Ven: 7.408 (ref 7.250–7.430)
pO2, Ven: 22 mmHg — CL (ref 32.0–45.0)

## 2021-01-15 LAB — RESP PANEL BY RT-PCR (FLU A&B, COVID) ARPGX2
Influenza A by PCR: NEGATIVE
Influenza B by PCR: NEGATIVE
SARS Coronavirus 2 by RT PCR: NEGATIVE

## 2021-01-15 LAB — TYPE AND SCREEN
ABO/RH(D): A NEG
Antibody Screen: NEGATIVE

## 2021-01-15 LAB — ABO/RH: ABO/RH(D): A NEG

## 2021-01-15 LAB — PROTIME-INR
INR: 1.2 (ref 0.8–1.2)
Prothrombin Time: 14.8 seconds (ref 11.4–15.2)

## 2021-01-15 LAB — CK: Total CK: 2303 U/L — ABNORMAL HIGH (ref 49–397)

## 2021-01-15 LAB — CBG MONITORING, ED: Glucose-Capillary: 123 mg/dL — ABNORMAL HIGH (ref 70–99)

## 2021-01-15 MED ORDER — LEVETIRACETAM IN NACL 500 MG/100ML IV SOLN
500.0000 mg | Freq: Two times a day (BID) | INTRAVENOUS | Status: DC
  Administered 2021-01-16: 500 mg via INTRAVENOUS
  Filled 2021-01-15 (×2): qty 100

## 2021-01-15 MED ORDER — DOCUSATE SODIUM 100 MG PO CAPS: 100.0000 mg | ORAL_CAPSULE | Freq: Two times a day (BID) | ORAL | Status: DC | PRN

## 2021-01-15 MED ORDER — ACETAMINOPHEN 325 MG PO TABS
650.0000 mg | ORAL_TABLET | ORAL | Status: DC | PRN
  Administered 2021-01-16 – 2021-01-19 (×8): 650 mg via ORAL
  Filled 2021-01-15 (×8): qty 2

## 2021-01-15 MED ORDER — LEVETIRACETAM 500 MG PO TABS
500.0000 mg | ORAL_TABLET | Freq: Once | ORAL | Status: AC
  Administered 2021-01-15: 500 mg via ORAL
  Filled 2021-01-15: qty 1

## 2021-01-15 MED ORDER — CLEVIDIPINE BUTYRATE 0.5 MG/ML IV EMUL
0.0000 mg/h | INTRAVENOUS | Status: DC
  Administered 2021-01-15: 1 mg/h via INTRAVENOUS
  Filled 2021-01-15: qty 50

## 2021-01-15 MED ORDER — POLYETHYLENE GLYCOL 3350 17 G PO PACK: 17.0000 g | PACK | Freq: Every day | ORAL | Status: DC | PRN

## 2021-01-15 MED ORDER — LACTATED RINGERS IV SOLN: INTRAVENOUS | Status: DC

## 2021-01-15 MED ORDER — ERYTHROMYCIN 5 MG/GM OP OINT
TOPICAL_OINTMENT | Freq: Four times a day (QID) | OPHTHALMIC | Status: DC
  Administered 2021-01-16: 1 via OPHTHALMIC
  Filled 2021-01-15: qty 3.5

## 2021-01-15 MED ORDER — PANTOPRAZOLE SODIUM 40 MG IV SOLR
40.0000 mg | Freq: Every day | INTRAVENOUS | Status: DC
  Administered 2021-01-15: 40 mg via INTRAVENOUS
  Filled 2021-01-15: qty 40

## 2021-01-15 MED ORDER — FENTANYL CITRATE (PF) 100 MCG/2ML IJ SOLN
25.0000 ug | Freq: Once | INTRAMUSCULAR | Status: AC
Start: 2021-01-15 — End: 2021-01-15
  Administered 2021-01-15: 25 ug via INTRAVENOUS
  Filled 2021-01-15: qty 2

## 2021-01-15 NOTE — ED Provider Notes (Signed)
[facility] for level II trauma following fall on thinners. He was last known well several days ago. He had an unwitnessed fall. He does complain of pain to his forehead. He has healing ecchymosis to the left forehead. He is confused and disoriented to place, time, and recent events. Records reviewed in epic. He had a CT head performed that demonstrates intraparenchymal hemorrhage. He does take Plavix. There is no evidence of additional trauma on evaluation.  On examination patient is encephalopathy. He does have a low-grade temperature. No clear source of infection at this time. He has been evaluated by neurosurgeon. Critical care consulted for admission.   Quintella Reichert, MD 01/15/21 2014
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
## 2021-01-15 NOTE — Progress Notes (Unsigned)
   Future Appointments  Date Time Provider [place]  01/21/2021 10:00 AM Unk Pinto, MD [facility] None  09/01/2021  2:05 PM [id number]

## 2021-01-15 NOTE — ED Notes (Signed)
Admitting at bedside 

## 2021-01-15 NOTE — ED Triage Notes (Signed)
Patient arrives with fall and confusion, thinks fall was sometime yesterday. He lives alone at home, daughter came to visit today and he said he has been crawling on the floor since yesterday. Swelling and ecchymosis noted to left forehead, daughter states he had put a cooking pot in the microwave and was confused when she arrived. Patient oriented x4 at this time, ambulatory with assistance.

## 2021-01-15 NOTE — Progress Notes (Signed)
eLink Physician-Brief Progress Note Patient Name: John [name] DOB: Dec 25, 1934 MRN: [id number]   Date of Service  01/15/2021  HPI/Events of Note  Patient complaining of headache, approximately 6/10 severity and no different from past headaches.  eICU Interventions  Tylenol 650 mg po Q 4 hours PRN headache ordered.        Frederik Pear 01/15/2021, 11:49 PM

## 2021-01-15 NOTE — ED Notes (Signed)
Resp panel collected at [facility].

## 2021-01-15 NOTE — Progress Notes (Signed)
VBG drawn and ran through iSTAT. Order for VBG was not placed as iSTAT and results did not transfer to Epic. Results on VBG were as followed: 7.408/38.02/14/23.1. Results given to physician.

## 2021-01-15 NOTE — Progress Notes (Signed)
eLink Physician-Brief Progress Note Patient Name: John [name] DOB: 04-08-1935 MRN: [id number]   Date of Service  01/15/2021  HPI/Events of Note  Patient admitted with traumatic ICH (intraparnchymal and subarachnoid) following a fall, patient also has altered mental status, per neurosurgery he is not a surgical candidate so PCCM was asked to admit.  eICU Interventions  New Patient Evaluation completed.        [name] 01/15/2021, 11:09 PM

## 2021-01-15 NOTE — Progress Notes (Signed)
Chaplain responded to this Level II fall.  Patient is a transfer from [facility].  [facility] indicated daughter, was on the way.  Chaplain met with the daughter and she shared they had called the house and patient did not answer they went to the home, where patient lives alone and patient had to crawl to let daughter in.  Daughter explained patient very independent and this change is not easy.  Chaplain offered empathetic/reflective listening and words of understanding and support.  Daughter has gone bedside, no other needs at this time.  Chaplain will have day chaplains follow up for support. [place], Mdiv.    01/15/21 1900  Clinical Encounter Type  Visited With Family;Health care provider  Visit Type ED;Trauma  Referral From Nurse  Consult/Referral To Chaplain  Stress Factors  Family Stress Factors Health changes

## 2021-01-15 NOTE — Consult Note (Signed)
Reason for Consult:traumatic cerebral contusions, traumatic SAH Referring Physician: Jeshurun, John [name] is an 86 y.o. male.  HPI: whom was found down at home this morning after not responding via phone to his daughter. His daughter then went to his home found him with bleeding about his head, food strewn over the apartment where he lives alone, and the microwave smoking with a pan inside. John [name] cannot recall if he lost consciousness. His speech was clear but not making sense. She called an ambulance and he was taken to [facility]. Head CT identified bifrontal cerebral contusions, small amount of subarachnoid hemorrhage.Sent to [facility] ED for further evaluation.   Past Medical History:  Diagnosis Date  . Anxiety   . Arthritis    "lower back; left ankle" (03/20/2014)  . ASTHMA   . Blood transfusion    "related to OHS, ulcers"  . CAD (coronary artery disease)    a. s/p 2V CABG 1996. b. Last cath 12/2016 -> patent grafts, med rx.  . CAD, ARTERY BYPASS GRAFT   . Cataract   . CKD (chronic kidney disease), stage II   . Colon polyps    adenomatous polyps  . COPD   . Depression   . Diabetes mellitus without complication (HCC)    no meds now  . Diverticulosis   . Duodenal ulcer without hemorrhage or perforation 12/29/2011  . GASTROESOPHAGEAL REFLUX DISEASE   . HYPERLIPIDEMIA   . Insomnia   . 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 Memory loss   . Nephrolithiasis    "I've had over 320 kidney stones" (03/20/2014)  . NEPHROLITHIASIS, HX OF 03/12/2009  . Parkinsonism ([place])   . Sleep apnea    does not use CPAP  . Stroke [facility])    TIA  . SUPRAVENTRICULAR TACHYCARDIA, HX OF   . SVT (supraventricular tachycardia) ([place]) 03/12/2009  . TIA   . Ulcer     Past Surgical History:  Procedure Laterality Date  . ANKLE FRACTURE SURGERY Left ~ 2012  . CARDIAC CATHETERIZATION    . COLONOSCOPY    . CORONARY ARTERY BYPASS GRAFT  1996   CABG X2  . CYSTOSCOPY WITH URETEROSCOPY, STONE  BASKETRY AND STENT PLACEMENT    . Cystourethroscopy with stent removal.    . EXCISIONAL HEMORRHOIDECTOMY    . GASTRECTOMY    . HIATAL HERNIA REPAIR    . IRRIGATION AND DEBRIDEMENT SEBACEOUS CYST     "off my back  . KNEE ARTHROSCOPY WITH MEDIAL MENISECTOMY Left 08/24/2019   Procedure: LEFT KNEE ARTHROSCOPY WITH PARTIAL MEDIAL MENISCECTOMY;  Surgeon: Leandrew Koyanagi, MD;  Location: [place];  Service: Orthopedics;  Laterality: Left;  . LEFT HEART CATH AND CORS/GRAFTS ANGIOGRAPHY N/A 12/23/2016   Procedure: Left Heart Cath and Cors/Grafts Angiography;  Surgeon: Burnell Blanks, MD;  Location: [place] [facility];  Service: Cardiovascular;  Laterality: N/A;  . POLYPECTOMY    . PROSTATE SURGERY     "took the center of my prostate out"  . UPPER GASTROINTESTINAL ENDOSCOPY    . VAGOTOMY    [facility]    Family History  Problem Relation Age of Onset  . Alcohol abuse Father   . Stroke Mother   . Heart disease Brother   . Diabetes Brother   . Heart disease Paternal Grandmother   . Colon cancer Neg Hx   . Stomach cancer Neg Hx   . Esophageal cancer Neg Hx   . Rectal cancer Neg Hx     Social  History:  reports that he quit smoking about 58 years ago. His smoking use included cigarettes. He smoked 0.50 packs per day. He quit smokeless tobacco use about 2 years ago.  His smokeless tobacco use included chew. He reports that he does not drink alcohol and does not use drugs.  Allergies:  Allergies  Allergen Reactions  . Gabapentin Other (See Comments)    Severe confusion  . Prozac [Fluoxetine Hcl] Other (See Comments)    Patient state that it does not agree with him  . Soma [Carisoprodol] Other (See Comments)    Patient stated that it does not agree with him  . Beta Adrenergic Blockers Other (See Comments)    REACTION: Bradycardia    Medications: I have reviewed the patient's current medications.  Results for orders placed or performed during the hospital encounter of  01/15/21 (from the past 48 hour(s))  CBG monitoring, ED     Status: Abnormal   Collection Time: 01/15/21  3:42 PM  Result Value Ref Range   Glucose-Capillary 123 (H) 70 - 99 mg/dL    Comment: Glucose reference range applies only to samples taken after fasting for at least 8 hours.  CBC with Differential     Status: None   Collection Time: 01/15/21  4:00 PM  Result Value Ref Range   WBC 8.7 4.0 - 10.5 K/uL   RBC 4.30 4.22 - 5.81 MIL/uL   Hemoglobin 14.0 13.0 - 17.0 g/dL   HCT 41.8 39.0 - 52.0 %   MCV 97.2 80.0 - 100.0 fL   MCH 32.6 26.0 - 34.0 pg   MCHC 33.5 30.0 - 36.0 g/dL   RDW 13.8 11.5 - 15.5 %   Platelets 215 150 - 400 K/uL   nRBC 0.0 0.0 - 0.2 %   Neutrophils Relative % 74 %   Neutro Abs 6.5 1.7 - 7.7 K/uL   Lymphocytes Relative 17 %   Lymphs Abs 1.4 0.7 - 4.0 K/uL   Monocytes Relative 8 %   Monocytes Absolute 0.7 0.1 - 1.0 K/uL   Eosinophils Relative 0 %   Eosinophils Absolute 0.0 0.0 - 0.5 K/uL   Basophils Relative 1 %   Basophils Absolute 0.0 0.0 - 0.1 K/uL   Immature Granulocytes 0 %   Abs Immature Granulocytes 0.02 0.00 - 0.07 K/uL    Comment: Performed at [facility]  Comprehensive metabolic panel     Status: Abnormal   Collection Time: 01/15/21  4:00 PM  Result Value Ref Range   Sodium 142 135 - 145 mmol/L   Potassium 3.9 3.5 - 5.1 mmol/L   Chloride 108 98 - 111 mmol/L   CO2 24 22 - 32 mmol/L   Glucose, Bld 129 (H) 70 - 99 mg/dL    Comment: Glucose reference range applies only to samples taken after fasting for at least 8 hours.   BUN 18 8 - 23 mg/dL   Creatinine, Ser 1.68 (H) 0.61 - 1.24 mg/dL   Calcium 8.7 (L) 8.9 - 10.3 mg/dL   Total Protein 6.3 (L) 6.5 - 8.1 g/dL   Albumin 4.0 3.5 - 5.0 g/dL   AST 56 (H) 15 - 41 U/L   ALT 21 0 - 44 U/L   Alkaline Phosphatase 59 38 - 126 U/L   Total Bilirubin 0.5 0.3 - 1.2 mg/dL   GFR, Estimated 40 (L) >60 mL/min    Comment: (NOTE) Calculated using the CKD-EPI Creatinine Equation (2021)    Anion  gap 10 5 -  15    Comment: Performed at [facility]  I-Stat venous blood gas, ED     Status: Abnormal   Collection Time: 01/15/21  4:12 PM  Result Value Ref Range   pH, Ven 7.408 7.250 - 7.430   pCO2, Ven 38.4 (L) 44.0 - 60.0 mmHg   pO2, Ven 22.0 (LL) 32.0 - 45.0 mmHg   Bicarbonate 24.1 20.0 - 28.0 mmol/L   TCO2 25 22 - 32 mmol/L   O2 Saturation 35.0 %   Acid-Base Excess 0.0 0.0 - 2.0 mmol/L   Sodium 141 135 - 145 mmol/L   Potassium 4.1 3.5 - 5.1 mmol/L   Calcium, Ion 1.21 1.15 - 1.40 mmol/L   HCT 38.0 (L) 39.0 - 52.0 %   Hemoglobin 12.9 (L) 13.0 - 17.0 g/dL   Patient temperature 99.8 F    Sample type VENOUS    Comment VALUES EXPECTED, NO REPEAT   Type and screen [place]     Status: None (Preliminary result)   Collection Time: 01/15/21  6:46 PM  Result Value Ref Range   ABO/RH(D) PENDING    Antibody Screen PENDING    Sample Expiration      01/18/2021,2359 Performed at [facility], [address], [place], [place] [address]   Protime-INR     Status: None   Collection Time: 01/15/21  6:46 PM  Result Value Ref Range   Prothrombin Time 14.8 11.4 - 15.2 seconds   INR 1.2 0.8 - 1.2    Comment: (NOTE) INR goal varies based on device and disease states. Performed at [facility], [place] [address] [place], [place] [address]     DG Chest 1 View  Result Date: 01/15/2021 CLINICAL DATA:  Cough, fell backwards striking head, bruising and bleeding at posterior RIGHT elbow EXAM: CHEST  1 VIEW COMPARISON:  12/20/2011 FINDINGS: Normal heart size post CABG. Moderate-sized hiatal hernia. Atherosclerotic calcification aorta. Lungs clear. No pulmonary infiltrate, pleural effusion, or pneumothorax. Osseous demineralization with evidence of prior cervical spine fusion. IMPRESSION: Hiatal hernia. No acute abnormalities. Aortic Atherosclerosis (ICD10-I70.0). Electronically Signed   By: Lavonia Dana M.D.   On: 01/15/2021 16:55   DG Pelvis 1-2  Views  Result Date: 01/15/2021 CLINICAL DATA:  [place] [address] backwards striking head EXAM: PELVIS - 1-2 VIEW COMPARISON:  07/20/2019 FINDINGS: Osseous demineralization. Hip and SI joint spaces preserved. No acute fracture, dislocation, or bone destruction. IMPRESSION: No acute abnormalities. Electronically Signed   By: Lavonia Dana M.D.   On: 01/15/2021 16:56   CT Head Wo Contrast  Result Date: 01/15/2021 CLINICAL DATA:  Unwitnessed fall. EXAM: CT HEAD WITHOUT CONTRAST CT CERVICAL SPINE WITHOUT CONTRAST TECHNIQUE: Multidetector CT imaging of the head and cervical spine was performed following the standard protocol without intravenous contrast. Multiplanar CT image reconstructions of the cervical spine were also generated. COMPARISON:  September 10, 2015. FINDINGS: CT HEAD FINDINGS Brain: Moderate amount of subarachnoid hemorrhage is noted in the left sylvian fissure. 3.5 x 2.4 cm right frontal intraparenchymal hemorrhage is noted. Smaller left frontal intraparenchymal hemorrhage is noted. Small amount of subarachnoid hemorrhage is also noted in the right frontal region. Mild chronic ischemic white matter disease is noted. Ventricular size is within normal limits. Small amount of intraventricular hemorrhage is noted in the left posterior horn. No midline shift is noted. No mass lesion is noted. Vascular: No hyperdense vessel or unexpected calcification. Skull: Normal. Negative for fracture or focal lesion. Sinuses/Orbits: No acute finding. Other: None. CT  CERVICAL SPINE FINDINGS Alignment: Normal. Skull base and vertebrae: No acute fracture. No primary bone lesion or focal pathologic process. Soft tissues and spinal canal: No prevertebral fluid or swelling. No visible canal hematoma. Disc levels:  Status post surgical anterior fusion of C5-6 and C6-7. Upper chest: Negative. Other: Severe degenerative changes seen involving the left-sided posterior facet joint of C3-4. IMPRESSION: 1. Bilateral frontal intraparenchymal  hemorrhages are noted, with the largest on the right measuring 3.5 x 2.4 cm. Moderate amount of subarachnoid hemorrhage is noted in left sylvian fissure. Small amount of subarachnoid hemorrhage is also noted in the right frontal region. Small amount of intraventricular hemorrhage is noted in the left posterior horn. No midline shift is noted. Critical Value/emergent results were called by telephone at the time of interpretation on 01/15/2021 at 4:49 pm to provider [facility] , who verbally acknowledged these results. 2. Postsurgical and degenerative changes are noted in the cervical spine. No fracture or spondylolisthesis is noted. Electronically Signed   By: Marijo Conception M.D.   On: 01/15/2021 16:49   CT Cervical Spine Wo Contrast  Result Date: 01/15/2021 CLINICAL DATA:  Unwitnessed fall. EXAM: CT HEAD WITHOUT CONTRAST CT CERVICAL SPINE WITHOUT CONTRAST TECHNIQUE: Multidetector CT imaging of the head and cervical spine was performed following the standard protocol without intravenous contrast. Multiplanar CT image reconstructions of the cervical spine were also generated. COMPARISON:  September 10, 2015. FINDINGS: CT HEAD FINDINGS Brain: Moderate amount of subarachnoid hemorrhage is noted in the left sylvian fissure. 3.5 x 2.4 cm right frontal intraparenchymal hemorrhage is noted. Smaller left frontal intraparenchymal hemorrhage is noted. Small amount of subarachnoid hemorrhage is also noted in the right frontal region. Mild chronic ischemic white matter disease is noted. Ventricular size is within normal limits. Small amount of intraventricular hemorrhage is noted in the left posterior horn. No midline shift is noted. No mass lesion is noted. Vascular: No hyperdense vessel or unexpected calcification. Skull: Normal. Negative for fracture or focal lesion. Sinuses/Orbits: No acute finding. Other: None. CT CERVICAL SPINE FINDINGS Alignment: Normal. Skull base and vertebrae: No acute fracture. No primary bone  lesion or focal pathologic process. Soft tissues and spinal canal: No prevertebral fluid or swelling. No visible canal hematoma. Disc levels:  Status post surgical anterior fusion of C5-6 and C6-7. Upper chest: Negative. Other: Severe degenerative changes seen involving the left-sided posterior facet joint of C3-4. IMPRESSION: 1. Bilateral frontal intraparenchymal hemorrhages are noted, with the largest on the right measuring 3.5 x 2.4 cm. Moderate amount of subarachnoid hemorrhage is noted in left sylvian fissure. Small amount of subarachnoid hemorrhage is also noted in the right frontal region. Small amount of intraventricular hemorrhage is noted in the left posterior horn. No midline shift is noted. Critical Value/emergent results were called by telephone at the time of interpretation on 01/15/2021 at 4:49 pm to provider [facility] , who verbally acknowledged these results. 2. Postsurgical and degenerative changes are noted in the cervical spine. No fracture or spondylolisthesis is noted. Electronically Signed   By: Marijo Conception M.D.   On: 01/15/2021 16:49    Review of Systems  Constitutional: Positive for fever.  Respiratory: Negative.   Cardiovascular: Negative.   Gastrointestinal: Negative.   Endocrine: Negative.   Genitourinary: Negative.   Musculoskeletal: Positive for neck pain.  Skin: Positive for wound.  Neurological: Positive for tremors and syncope.       Parkinson's disease  Hematological: Bruises/bleeds easily.       Currently taking plavix  Psychiatric/Behavioral: Negative.    Blood pressure (!) 153/71, pulse 93, temperature 100.1 F (37.8 C), temperature source Oral, resp. rate 15, height 6' (1.829 m), weight 78 kg, SpO2 100 %. Physical Exam Constitutional:      Appearance: He is normal weight.  HENT:     Head: Normocephalic.     Comments: Ecchymosis left forehead    Right Ear: External ear normal.     Left Ear: External ear normal.     Nose: Nose normal.      Mouth/Throat:     Mouth: Mucous membranes are dry.     Pharynx: Oropharynx is clear.  Eyes:     Extraocular Movements: Extraocular movements intact.     Conjunctiva/sclera: Conjunctivae normal.     Pupils: Pupils are equal, round, and reactive to light.  Cardiovascular:     Pulses: Normal pulses.  Pulmonary:     Effort: Pulmonary effort is normal.  Abdominal:     General: Abdomen is flat.  Musculoskeletal:     Cervical back: Normal range of motion.  Skin:    General: Skin is warm and dry.  Neurological:     General: No focal deficit present.     Mental Status: He is alert.     Assessment/Plan: Admit to neurological surgery icu for observation. Also being evaluated for possible fuo. Neurologically following all commands, no drift. Basal cisterns widely patent. Will stop plavix, and must give strong consideration to discontinuing the plavix as he falls quite often.   Ashok Pall 01/15/2021, 7:37 PM

## 2021-01-15 NOTE — ED Notes (Addendum)
Neurologist MD Cabbell at bedside.

## 2021-01-15 NOTE — ED Notes (Signed)
Pt never complained of a headache the entire time since his arrival. However, Pt just stated that he had a headache 3 out 10 that he has had the entire time since he has been at the hospital. Pt pointed to his forehead when asked where he was experieicnig  Headache. Daughter has been at bedside the whole time and was surpreised about his complaint a headache. Pt is not in distress and is resting comfortably. MD notified

## 2021-01-15 NOTE — Progress Notes (Signed)
Orthopedic Tech Progress Note Patient Details:  John [name] Mar 10, 1935 [id number] Level 2 Trauma Patient ID: [name], male   DOB: June 06, 1935, 86 y.o.   MRN: [id number]   [name] 01/15/2021, 7:12 PM

## 2021-01-15 NOTE — ED Notes (Signed)
Visitor given ginger ale.  No needs voiced by patient at this time.  Patient resting comfortably watching TV.

## 2021-01-15 NOTE — H&P (Addendum)
NAME:  John [name], MRN:  [id number], DOB:  [birth year], LOS: 0 ADMISSION DATE:  01/15/2021, CONSULTATION DATE: 3/24 REFERRING MD: Dr. Ralene Bathe, CHIEF COMPLAINT: ICH  History of Present Illness:  86 year old male with past medical history as below, which is significant for coronary artery disease status post CABG in 1996, aortic insufficiency, chronic hypotension on Florinef, DM2 managed with diet, and mild dementia.  The patient is a poor historian secondary to acute condition causing confusion.  He was presumably in his usual state of health until Saturday 3/19 or Sunday 3/20.  He suffered a fall striking his head either that Saturday night with a Sunday morning before church.  He seemed to be "not right" and stumbling at church that Sunday and again the following Wednesday night.  When he fell he reportedly laid on the ground for approximately 3 hours and was unable to get up.  When his daughter went to check on him 3/24 his house was very disheveled in the microwave was smoking with a metal pot inside.  He was confused prompting them to present to [place].  CT scan was done and showed multiple foci of hemorrhagic stroke prompting transfer to [facility] emergency department.  At [facility] he was evaluated by neurosurgery for intracranial hemorrhage and subarachnoid hemorrhage.  No surgical interventions were recommended and he was admitted to [facility].  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   Significant Hospital Events: Including procedures, antibiotic start and stop dates in addition to other pertinent events   . 3/24 admitted to [facility] for intracranial hemorrhage and subarachnoid hemorrhage.  Interim History / Subjective:    Objective   Blood pressure (!) 158/81, pulse 88, temperature 99.6 F (37.6 C), temperature source Rectal, resp. rate (!) 24, height 6' (1.829 m), weight 78 kg, SpO2 100 %.       No intake or output data in the 24 hours ending 01/15/21 2056 Filed Weights   01/15/21 1540  Weight: 78 kg    Examination: General: Elderly male in no acute distress HENT: Hematoma over the left frontal scalp.  Bilateral eyes with injected sclera and purulent drainage. Lungs: Clear bilateral breath sounds, no distress Cardiovascular: Tachycardic, regular, no murmurs rubs gallops Abdomen: Soft, nontender, nondistended Extremities: No acute deformities or range of motion limitations Neuro: Alert, oriented, nonfocal, mild confusion.  cranial nerves II through XII grossly intact.  5 out of 5 strength in all extremities.  Labs/imaging that I havepersonally reviewed   -Chest x-ray unremarkable -X-ray pelvis unremarkable -CT of the head and C-spine with bilateral frontal IPH and a moderate amount of subarachnoid hemorrhage in the left sylvian fissure.  Small out of [place] also noted in the right frontal region.  Small amount of intraventricular hemorrhage. -Labs: Creatinine 1.68 up from 1.26 in September, CK 2300, AST 56, calcium 8.7, glucose 129.  Resolved Hospital Problem list     Assessment & Plan:   Intraparenchymal hemorrhage Subarachnoid hemorrhage -Admit to ICU for close monitoring -Start clevidipine to keep systolic blood pressure less than 140  mm/Hg, holding home florinef -Neurosurgery following, no recommendations at this time -Frequent  neuro checks -Keppra for sz prophylaxis -Repeat CT at first sign of neurologic deterioration -hold Plavix  DM2: Controlled with diet -CBG monitoring and SSI.  Tight glucose control less than 180  AKI Rhabdomyolysis - IVF - Trend BMP  Fever: CXR clear. No UA yet. No infectious complaints. May be central fever - UA - hold off ABX.   Chronic hypotension: -Holding Florinef  Hyperlipidemia: -Continue statin  History of CAD status post CABG in 1996 Aortic insufficiency -Statin as above, holding Plavix  Conjunctivitis - erythromycin ointment  Best practice (evaluated daily)  Diet:  NPO Pain/Anxiety/Delirium protocol (if indicated): No VAP protocol (if indicated): Not indicated DVT prophylaxis: SCD GI prophylaxis: PPI Glucose control:  SSI Yes Central venous access:  N/A Arterial line:  N/A Foley:  N/A Mobility:  bed rest  PT consulted: Yes Last date of multidisciplinary goals of care discussion [3/24] Code Status:  full code Disposition: Admit to ICU  Labs   CBC: Recent Labs  Lab 01/15/21 1600 01/15/21 1612  WBC 8.7  --   NEUTROABS 6.5  --   HGB 14.0 12.9*  HCT 41.8 38.0*  MCV 97.2  --   PLT 215  --     Basic Metabolic Panel: Recent Labs  Lab 01/15/21 1600 01/15/21 1612  NA 142 141  K 3.9 4.1  CL 108  --   CO2 24  --   GLUCOSE 129*  --   BUN 18  --   CREATININE 1.68*  --   CALCIUM 8.7*  --    GFR: Estimated Creatinine Clearance: 35.9 mL/min (A) (by C-G formula based on SCr of 1.68 mg/dL (H)). Recent Labs  Lab 01/15/21 1600 01/15/21 1902  WBC 8.7  --   LATICACIDVEN  --  1.4    Liver Function Tests: Recent Labs  Lab 01/15/21 1600  AST 56*  ALT 21  ALKPHOS 59  BILITOT 0.5  PROT 6.3*  ALBUMIN 4.0   No results for input(s): LIPASE, AMYLASE in the last 168 hours. No results for input(s): AMMONIA in the last 168 hours.  ABG    Component Value Date/Time   HCO3 24.1 01/15/2021 1612   TCO2 25 01/15/2021 1612   ACIDBASEDEF 1.0  12/30/2007 2022   O2SAT 35.0 01/15/2021 1612     Coagulation Profile: Recent Labs  Lab 01/15/21 1846  INR 1.2    Cardiac Enzymes: Recent Labs  Lab 01/15/21 1846  CKTOTAL 2,303*    HbA1C: Hgb A1c MFr Bld  Date/Time Value Ref Range Status  04/09/2020 03:14 PM 5.3 <5.7 % of total Hgb Final    Comment:    For the purpose of screening for the presence of diabetes: . <5.7%       Consistent with the absence of diabetes 5.7-6.4%    Consistent with increased risk for diabetes             (prediabetes) > or =6.5%  Consistent with diabetes . This assay result is consistent with a decreased risk of diabetes. . Currently, no consensus exists regarding use of hemoglobin A1c for diagnosis of diabetes in children. . According to American Diabetes Association (ADA) guidelines, hemoglobin A1c <7.0% represents optimal control in non-pregnant diabetic patients. Different metrics may apply to specific patient populations.  Standards of Medical Care in Diabetes(ADA). [name]   01/15/2020 10:08 AM 5.6 <5.7 % of total Hgb Final    Comment:    For the purpose  of screening for the presence of diabetes: . <5.7%       Consistent with the absence of diabetes 5.7-6.4%    Consistent with increased risk for diabetes             (prediabetes) > or =6.5%  Consistent with diabetes . This assay result is consistent with a decreased risk of diabetes. . Currently, no consensus exists regarding use of hemoglobin A1c for diagnosis of diabetes in children. . According to American Diabetes Association (ADA) guidelines, hemoglobin A1c <7.0% represents optimal control in non-pregnant diabetic patients. Different metrics may apply to specific patient populations.  Standards of Medical Care in Diabetes(ADA). .     CBG: Recent Labs  Lab 01/15/21 1542  GLUCAP 123*    Review of Systems:   Bolds are positive  Constitutional: weight loss, gain, night sweats, Fevers, chills, fatigue .  HEENT:  headaches, Sore throat, sneezing, nasal congestion, post nasal drip, Difficulty swallowing, Tooth/dental problems, visual complaints visual changes, ear ache CV:  chest pain, radiates:,Orthopnea, PND, swelling in lower extremities, dizziness, palpitations, syncope.  GI  heartburn, indigestion, abdominal pain, nausea, vomiting, diarrhea, change in bowel habits, loss of appetite, bloody stools.  Resp: cough, productive:, hemoptysis, dyspnea, chest pain, pleuritic.  Skin: rash or itching or icterus GU: dysuria, change in color of urine, urgency or frequency. flank pain, hematuria  MS: joint pain or swelling. decreased range of motion  Psych: change in mood or affect. depression or anxiety.  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   Surgical History:   Past Surgical History:  Procedure Laterality Date  . ANKLE FRACTURE SURGERY Left ~ 2012  . CARDIAC CATHETERIZATION    . COLONOSCOPY    . CORONARY ARTERY BYPASS GRAFT  1996   CABG X2  . CYSTOSCOPY WITH URETEROSCOPY, STONE BASKETRY AND STENT PLACEMENT    . Cystourethroscopy with stent removal.    . EXCISIONAL HEMORRHOIDECTOMY    . GASTRECTOMY    . HIATAL HERNIA REPAIR    . IRRIGATION AND DEBRIDEMENT SEBACEOUS CYST     "off my back  . KNEE ARTHROSCOPY WITH MEDIAL  MENISECTOMY Left 08/24/2019   Procedure: LEFT KNEE ARTHROSCOPY WITH PARTIAL MEDIAL MENISCECTOMY;  Surgeon: Leandrew Koyanagi, MD;  Location: [place];  Service: Orthopedics;  Laterality: Left;  . LEFT HEART CATH AND CORS/GRAFTS ANGIOGRAPHY N/A 12/23/2016   Procedure: Left Heart Cath and Cors/Grafts Angiography;  Surgeon: Burnell Blanks, MD;  Location: [place] [facility];  Service: Cardiovascular;  Laterality: N/A;  . POLYPECTOMY    . PROSTATE SURGERY     "took the center of my prostate out"  . UPPER GASTROINTESTINAL ENDOSCOPY    . VAGOTOMY       Social History:   reports that he quit smoking about 58 years ago. His smoking use included cigarettes. He smoked 0.50 packs per day. He quit smokeless tobacco use about 2 years ago.  His smokeless tobacco use included chew. He reports that he does not drink alcohol and  does not use drugs.   Family History:  His family history includes Alcohol abuse in his father; Diabetes in his brother; Heart disease in his brother and paternal grandmother; Stroke in his mother. There is no history of Colon cancer, Stomach cancer, Esophageal cancer, or Rectal cancer.   Allergies Allergies  Allergen Reactions  . Gabapentin Other (See Comments)    Severe confusion  . Prozac [Fluoxetine Hcl] Other (See Comments)    Patient state that it does not agree with him  . Soma [Carisoprodol] Other (See Comments)    Patient stated that it does not agree with him  . Beta Adrenergic Blockers Other (See Comments)    REACTION: Bradycardia     Home Medications  Prior to Admission medications   Medication Sig Start Date End Date Taking? Authorizing Provider  ALPRAZolam Duanne Moron) 1 MG tablet Take      1/2 to 1 tablet      at Bedtime      ONLY        if needed for Sleep and please try to limit to 5 days /week to avoid addiction 09/24/20  Yes Unk Pinto, MD  Cholecalciferol (VITAMIN D3) 2000 UNITS capsule Take 8,000 Units by mouth daily.    [provider]  clopidogrel (PLAVIX) 75 MG tablet Take 1 tablet Daily to prevent Blood Clots Patient taking differently: Take 75 mg by mouth daily. 11/12/19   Unk Pinto, MD  escitalopram (LEXAPRO) 10 MG tablet Take  1 tablet  Daily  for Mood & Anxiety 11/27/20   Unk Pinto, MD  finasteride (PROSCAR) 5 MG tablet Take      1 tablet       Daily       for Prostate 10/28/20   Unk Pinto, MD  fludrocortisone (FLORINEF) 0.1 MG tablet Take 1 tablet 2 x /day for Low BP 08/14/19   Unk Pinto, MD  glucose blood (FREESTYLE LITE) test strip TEST BLOOD SUGAR ONCE DAILY 06/01/19   Unk Pinto, MD  Lancets (FREESTYLE) lancets 1 each by Other route as needed for other. Use as instructed    [provider]  Melatonin 5 MG CAPS Take 10 mg by mouth at bedtime.     [provider]  mirtazapine (REMERON SOL-TAB) 30 MG disintegrating tablet Take  1 tablet  at Bedtime  for Sleep & Appetite 12/24/20   Unk Pinto, MD  nitroGLYCERIN (NITROSTAT) 0.4 MG SL tablet Sig: 1 tablet under tongue every 3 to 5 minutes as needed for Angina - Please Dispense # 2 bottles of # 25 tabs 07/17/18   Unk Pinto, MD  ondansetron [facility]) 8 MG tablet Take 1/2 to 1 tablet 3 x /day Only if needed for Nausea 01/15/20   Unk Pinto, MD  pantoprazole (PROTONIX) 40 MG tablet TAKE ONE TABLET BY MOUTH ONCE DAILY 07/29/17   Vicie Mutters R, PA-C  potassium citrate (UROCIT-K) 10 MEQ (1080 MG) SR tablet Take 1 tablet 2 x /day to Prevent Kidney Stones 10/01/19   Unk Pinto, MD  QUEtiapine (SEROQUEL) 100 MG tablet TAKE 1 TO 2 TABLETS BY MOUTH AT BEDTIME AS NEEDED FOR SLEEP 10/31/20   Garnet Sierras, NP  silodosin (RAPAFLO) 8 MG CAPS capsule Take        1 capsule        at Bedtime       for Prostate 10/28/20   Unk Pinto, MD  simvastatin (ZOCOR) 80 MG tablet Take 1 tablet at Bedtime for Cholesterol 10/01/19  Unk Pinto, MD  vitamin C (ASCORBIC ACID) 500 MG tablet Take 500 mg by mouth daily.     [provider]  zinc gluconate 50 MG tablet Take 50 mg by mouth every other day.    [provider]     Critical care time: 50 mins     Georgann Housekeeper, AGACNP-BC [place]  See Amion for personal pager PCCM on call pager [phone] until 7pm. Please call Elink 7p-7a. [phone]  01/15/2021 9:23 PM

## 2021-01-16 ENCOUNTER — Inpatient Hospital Stay (HOSPITAL_COMMUNITY): Payer: PPO

## 2021-01-16 DIAGNOSIS — I629 Nontraumatic intracranial hemorrhage, unspecified: Secondary | ICD-10-CM

## 2021-01-16 DIAGNOSIS — I951 Orthostatic hypotension: Secondary | ICD-10-CM

## 2021-01-16 DIAGNOSIS — I169 Hypertensive crisis, unspecified: Secondary | ICD-10-CM

## 2021-01-16 LAB — MRSA PCR SCREENING: MRSA by PCR: NEGATIVE

## 2021-01-16 LAB — RAPID URINE DRUG SCREEN, HOSP PERFORMED
Amphetamines: NOT DETECTED
Barbiturates: NOT DETECTED
Benzodiazepines: POSITIVE — AB
Cocaine: NOT DETECTED
Opiates: NOT DETECTED
Tetrahydrocannabinol: NOT DETECTED

## 2021-01-16 LAB — URINALYSIS, ROUTINE W REFLEX MICROSCOPIC
Bacteria, UA: NONE SEEN
Bilirubin Urine: NEGATIVE
Glucose, UA: 150 mg/dL — AB
Ketones, ur: 5 mg/dL — AB
Leukocytes,Ua: NEGATIVE
Nitrite: NEGATIVE
Protein, ur: 30 mg/dL — AB
Specific Gravity, Urine: 1.02 (ref 1.005–1.030)
pH: 5 (ref 5.0–8.0)

## 2021-01-16 LAB — LACTIC ACID, PLASMA: Lactic Acid, Venous: 0.8 mmol/L (ref 0.5–1.9)

## 2021-01-16 LAB — CBC
HCT: 36.5 % — ABNORMAL LOW (ref 39.0–52.0)
Hemoglobin: 12.5 g/dL — ABNORMAL LOW (ref 13.0–17.0)
MCH: 33.2 pg (ref 26.0–34.0)
MCHC: 34.2 g/dL (ref 30.0–36.0)
MCV: 96.8 fL (ref 80.0–100.0)
Platelets: 180 10*3/uL (ref 150–400)
RBC: 3.77 MIL/uL — ABNORMAL LOW (ref 4.22–5.81)
RDW: 13.7 % (ref 11.5–15.5)
WBC: 8.5 10*3/uL (ref 4.0–10.5)
nRBC: 0 % (ref 0.0–0.2)

## 2021-01-16 LAB — MAGNESIUM: Magnesium: 1.9 mg/dL (ref 1.7–2.4)

## 2021-01-16 LAB — BASIC METABOLIC PANEL
Anion gap: 8 (ref 5–15)
BUN: 16 mg/dL (ref 8–23)
CO2: 21 mmol/L — ABNORMAL LOW (ref 22–32)
Calcium: 8.3 mg/dL — ABNORMAL LOW (ref 8.9–10.3)
Chloride: 109 mmol/L (ref 98–111)
Creatinine, Ser: 1.5 mg/dL — ABNORMAL HIGH (ref 0.61–1.24)
GFR, Estimated: 46 mL/min — ABNORMAL LOW (ref >60)
Glucose, Bld: 91 mg/dL (ref 70–99)
Potassium: 3.7 mmol/L (ref 3.5–5.1)
Sodium: 138 mmol/L (ref 135–145)

## 2021-01-16 LAB — PHOSPHORUS: Phosphorus: 3.4 mg/dL (ref 2.5–4.6)

## 2021-01-16 LAB — CK: Total CK: 1745 U/L — ABNORMAL HIGH (ref 49–397)

## 2021-01-16 MED ORDER — MIRTAZAPINE 15 MG PO TABS
7.5000 mg | ORAL_TABLET | Freq: Every day | ORAL | Status: DC
Start: 2021-01-16 — End: 2021-01-19
  Administered 2021-01-16 – 2021-01-18 (×3): 7.5 mg via ORAL
  Filled 2021-01-16 (×3): qty 1

## 2021-01-16 MED ORDER — PANTOPRAZOLE SODIUM 40 MG PO TBEC
40.0000 mg | DELAYED_RELEASE_TABLET | Freq: Every day | ORAL | Status: DC
  Administered 2021-01-16 – 2021-01-18 (×3): 40 mg via ORAL
  Filled 2021-01-16 (×3): qty 1

## 2021-01-16 MED ORDER — FINASTERIDE 5 MG PO TABS: 5.0000 mg | ORAL_TABLET | Freq: Every day | ORAL | Status: DC

## 2021-01-16 MED ORDER — CHLORHEXIDINE GLUCONATE CLOTH 2 % EX PADS
6.0000 | MEDICATED_PAD | Freq: Every day | CUTANEOUS | Status: DC
  Administered 2021-01-16 – 2021-01-19 (×3): 6 via TOPICAL

## 2021-01-16 MED ORDER — POTASSIUM CHLORIDE CRYS ER 20 MEQ PO TBCR
40.0000 meq | EXTENDED_RELEASE_TABLET | Freq: Once | ORAL | Status: AC
  Administered 2021-01-16: 40 meq via ORAL
  Filled 2021-01-16: qty 2

## 2021-01-16 MED ORDER — LEVETIRACETAM 500 MG PO TABS
500.0000 mg | ORAL_TABLET | Freq: Two times a day (BID) | ORAL | Status: DC
  Administered 2021-01-16 – 2021-01-19 (×6): 500 mg via ORAL
  Filled 2021-01-16 (×6): qty 1

## 2021-01-16 MED ORDER — LABETALOL HCL 5 MG/ML IV SOLN: 10.0000 mg | INTRAVENOUS | Status: DC | PRN

## 2021-01-16 MED ORDER — SODIUM CHLORIDE 0.9 % IV SOLN
INTRAVENOUS | Status: DC | PRN
  Administered 2021-01-16: 250 mL via INTRAVENOUS

## 2021-01-16 MED ORDER — MELATONIN 3 MG PO TABS
3.0000 mg | ORAL_TABLET | Freq: Every day | ORAL | Status: DC
Start: 2021-01-16 — End: 2021-01-19
  Administered 2021-01-16 – 2021-01-18 (×3): 3 mg via ORAL
  Filled 2021-01-16 (×3): qty 1

## 2021-01-16 MED ORDER — AMLODIPINE BESYLATE 10 MG PO TABS
10.0000 mg | ORAL_TABLET | Freq: Every day | ORAL | Status: DC
  Administered 2021-01-16 – 2021-01-19 (×4): 10 mg via ORAL
  Filled 2021-01-16 (×4): qty 1

## 2021-01-16 MED ORDER — MAGNESIUM SULFATE 2 GM/50ML IV SOLN
2.0000 g | Freq: Once | INTRAVENOUS | Status: AC
  Administered 2021-01-16: 2 g via INTRAVENOUS
  Filled 2021-01-16: qty 50

## 2021-01-16 MED ORDER — ESCITALOPRAM OXALATE 10 MG PO TABS
10.0000 mg | ORAL_TABLET | Freq: Every day | ORAL | Status: DC
  Administered 2021-01-16: 10 mg via ORAL
  Filled 2021-01-16: qty 1

## 2021-01-16 MED ORDER — ONDANSETRON HCL 4 MG/2ML IJ SOLN: 4.0000 mg | Freq: Four times a day (QID) | INTRAMUSCULAR | Status: DC | PRN

## 2021-01-16 NOTE — Progress Notes (Signed)
NAME:  John [name], MRN:  [id number], DOB:  25-Jan-1935, LOS: 1 ADMISSION DATE:  01/15/2021, CONSULTATION DATE: 3/24 REFERRING MD: Dr. Ralene Bathe, CHIEF COMPLAINT: ICH  History of Present Illness:  86 year old male with past medical history as below, which is significant for coronary artery disease status post CABG in 1996, aortic insufficiency, chronic hypotension on Florinef, DM2 managed with diet, and mild dementia.  The patient is a poor historian secondary to acute condition causing confusion.  He was presumably in his usual state of health until Saturday 3/19 or Sunday 3/20.  He suffered a fall striking his head either that Saturday night with a Sunday morning before church.  He seemed to be "not right" and stumbling at church that Sunday and again the following Wednesday night.  When he fell he reportedly laid on the ground for approximately 3 hours and was unable to get up.  When his daughter went to check on him 3/24 his house was very disheveled in the microwave was smoking with a metal pot inside.  He was confused prompting them to present to [place].  CT scan was done and showed multiple foci of hemorrhagic stroke prompting transfer to [facility] emergency department.  At [facility] he was evaluated by neurosurgery for intracranial hemorrhage and subarachnoid hemorrhage.  No surgical interventions were recommended and he was admitted to [facility].  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   Significant Hospital Events: Including procedures, antibiotic start and stop dates in addition to other pertinent events   . 3/24 admitted to [facility] for intracranial hemorrhage and subarachnoid hemorrhage.  Interim History / Subjective:    Objective   Blood pressure 133/84, pulse (!) 56, temperature 98.7 F (37.1 C), temperature source Oral, resp. rate (!) 25, height 6' (1.829 m), weight 75.7 kg, SpO2 96 %.        Intake/Output Summary (Last 24 hours) at 01/16/2021 0944 Last data filed at 01/16/2021 0300 Gross per 24 hour  Intake 359.62 ml  Output 200 ml  Net 159.62 ml   Filed Weights   01/15/21 1540 01/16/21 0500  Weight: 78 kg 75.7 kg    Examination: General: Elderly chronically ill appearing M side lying in bed NAD  HENT: L frontal scalp hematoma. Conjunctivitis, green drainage L>R. Pink mmm  Lungs: CTAb symmetrical chest expansion, no accessory use  Cardiovascular: rr s1s2 cap refill brisk  Abdomen: Soft ndnt + bowel sounds Extremities: No acute joint abnormalities. No cyanosis no clubbing  Neuro: Awakens to voice, following commands. PERRL. 5/5 BUE BLE   Labs/imaging that I havepersonally reviewed   CT H Cbc BMP  Admission CK   Resolved Hospital Problem list     Assessment & Plan:   [place] IPH -admitted to ICU for close monitoring, was evaluated by NSGY- no intervention indicated  P - off cleviprex. SBP goal < 140 -- cleviprex dc -Continue cardiac monitoring  -holding home florinef -Frequent neuro checks -Keppra for sz prophylaxis -Repeat CT ordered by NSGY, pending  -hold Plavix -PT/OT  DM2 -CBG monitoring  -  diet managed at home   AKI with elevated CK  -rhabdo P - IVF - trend renal indices UOP  - recheck CK  Chronic hypotension  -Holding Florinef  HLD -Continue statin  Hx CAD s/p CABG  Aortic  insufficiency -Statin as above, holding Plavix  Conjunctivitis  - erythromycin ointment  Best practice (evaluated daily)  Diet:  NPO Pain/Anxiety/Delirium protocol (if indicated): No VAP protocol (if indicated): Not indicated DVT prophylaxis: SCD GI prophylaxis: PPI Glucose control:  SSI No Central venous access:  N/A Arterial line:  N/A Foley:  N/A Mobility:  OOB -- PT   PT consulted: Yes Last date of multidisciplinary goals of care discussion [3/24] Code Status:  full code Disposition: In ICU. Pending repeat CT may be able to transfer out as pt is off cleviprex and HDS, with stable clinical exam   Labs   CBC: Recent Labs  Lab 01/15/21 1600 01/15/21 1612 01/16/21 0325  WBC 8.7  --  8.5  NEUTROABS 6.5  --   --   HGB 14.0 12.9* 12.5*  HCT 41.8 38.0* 36.5*  MCV 97.2  --  96.8  PLT 215  --  161    Basic Metabolic Panel: Recent Labs  Lab 01/15/21 1600 01/15/21 1612 01/16/21 0325  NA 142 141 138  K 3.9 4.1 3.7  CL 108  --  109  CO2 24  --  21*  GLUCOSE 129*  --  91  BUN 18  --  16  CREATININE 1.68*  --  1.50*  CALCIUM 8.7*  --  8.3*  MG  --   --  1.9  PHOS  --   --  3.4   GFR: Estimated Creatinine Clearance: 39.3 mL/min (A) (by C-G formula based on SCr of 1.5 mg/dL (H)). Recent Labs  Lab 01/15/21 1600 01/15/21 1902 01/16/21 0325  WBC 8.7  --  8.5  LATICACIDVEN  --  1.4 0.8    Liver Function Tests: Recent Labs  Lab 01/15/21 1600  AST 56*  ALT 21  ALKPHOS 59  BILITOT 0.5  PROT 6.3*  ALBUMIN 4.0   No results for input(s): LIPASE, AMYLASE in the last 168 hours. No results for input(s): AMMONIA in the last 168 hours.  ABG    Component Value Date/Time   HCO3 24.1 01/15/2021 1612   TCO2 25 01/15/2021 1612   ACIDBASEDEF 1.0 12/30/2007 2022   O2SAT 35.0 01/15/2021 1612     Coagulation Profile: Recent Labs  Lab 01/15/21 1846  INR 1.2    Cardiac Enzymes: Recent Labs  Lab 01/15/21 1846  CKTOTAL 2,303*    HbA1C: Hgb A1c MFr Bld   Date/Time Value Ref Range Status  04/09/2020 03:14 PM 5.3 <5.7 % of total Hgb Final    Comment:    For the purpose of screening for the presence of diabetes: . <5.7%       Consistent with the absence of diabetes 5.7-6.4%    Consistent with increased risk for diabetes             (prediabetes) > or =6.5%  Consistent with diabetes . This assay result is consistent with a decreased risk of diabetes. . Currently, no consensus exists regarding use of hemoglobin A1c for diagnosis of diabetes in children. . According to American Diabetes Association (ADA) guidelines, hemoglobin A1c <7.0% represents optimal control in non-pregnant diabetic patients. Different metrics may apply to specific patient populations.  Standards of Medical Care in Diabetes(ADA). [name]   01/15/2020 10:08 AM 5.6 <5.7 %  of total Hgb Final    Comment:    For the purpose of screening for the presence of diabetes: . <5.7%       Consistent with the absence of diabetes 5.7-6.4%    Consistent with increased risk for diabetes             (prediabetes) > or =6.5%  Consistent with diabetes . This assay result is consistent with a decreased risk of diabetes. . Currently, no consensus exists regarding use of hemoglobin A1c for diagnosis of diabetes in children. . According to American Diabetes Association (ADA) guidelines, hemoglobin A1c <7.0% represents optimal control in non-pregnant diabetic patients. Different metrics may apply to specific patient populations.  Standards of Medical Care in Diabetes(ADA). .     CBG: Recent Labs  Lab 01/15/21 1542  GLUCAP 123*   CRITICAL CARE Performed by: Cristal Generous   Total critical care time: 35 minutes  Critical care time was exclusive of separately billable procedures and treating other patients. Critical care was necessary to treat or prevent imminent or life-threatening deterioration.  Critical care was time spent personally by me on the following  activities: development of treatment plan with patient and/or surrogate as well as nursing, discussions with consultants, evaluation of patient's response to treatment, examination of patient, obtaining history from patient or surrogate, ordering and performing treatments and interventions, ordering and review of laboratory studies, ordering and review of radiographic studies, pulse oximetry and re-evaluation of patient's condition.

## 2021-01-16 NOTE — TOC CAGE-AID Note (Cosign Needed Addendum)
Transition of Care [facility]) - CAGE-AID Screening   Patient Details  Name: John [name] MRN: [id number] Date of Birth: [birth year]  Transition of Care [facility]) CM/SW Contact:    Glennon Hamilton, [place] Work Phone Number: 01/16/2021, 12:12 PM      Clinical Narrative: CSW intern completed CAGE-AID screening. Patient reported that he is doing okay and denied any alcohol or drug use when asked about it.    CAGE-AID Screening:    Have You Ever Felt You Ought to Cut Down on Your Drinking or Drug Use?: No Have People Annoyed You By Critizing Your Drinking Or Drug Use?: No Have You Felt Bad Or Guilty About Your Drinking Or Drug Use?: No Have You Ever Had a Drink or Used Drugs First Thing In The Morning to Steady Your Nerves or to Get Rid of a Hangover?: No CAGE-AID Score: 0

## 2021-01-16 NOTE — Evaluation (Signed)
Occupational Therapy Evaluation Patient Details Name: John [name] MRN: [id number] DOB: January 07, 1935 Today's Date: 01/16/2021    History of Present Illness 86 yo male admitted to [facility] on 3/24 with fall on 3/19 and again on 3/23, pt's daughter found pt down on 3/24 and pt states he had been crawling since fall. CTH shows bilateral frontal intraparenchymal hemorrhage with a subarachnoid component and IVH, no midline shift. PMH includes CAD, s/p CABG, aortic insufficiency, hypotension on florinef, DM2 (diet managed), mild dementia, parkinsonism, anxiety/depression.   Clinical Impression   PTA, pt was living alone and was independent with ADLs; reports he was driving and enjoys going to church. Pt currently requiring Min Guard A for ADLs and Min Guard-Min A for functional mobility. Pt presenting with cognitive and balance deficits. Pt would benefit from further acute OT to facilitate safe dc. Pending increased support at dc, recommend dc to hoem with HHOT for further OT to optimize safety, independence with ADLs, and return to PLOF.     Follow Up Recommendations  Home health OT;Supervision/Assistance - 24 hour (Pending initial 24/7 support)    Equipment Recommendations  Tub/shower seat    Recommendations for Other Services PT consult     Precautions / Restrictions Precautions Precautions: Fall      Mobility Bed Mobility Overal bed mobility: Needs Assistance Bed Mobility: Rolling;Sidelying to Sit Rolling: Min guard Sidelying to sit: Min assist       General bed mobility comments: Min A for intiating bringing BLES towards EOB.    Transfers Overall transfer level: Needs assistance   Transfers: Sit to/from Stand Sit to Stand: Min assist;Min guard         General transfer comment: Initial Min A for safety and balance. progressign to [employer] A for safety    Balance Overall balance assessment: Needs assistance Sitting-balance support: Feet supported;No upper extremity  supported Sitting balance-Leahy Scale: Good     Standing balance support: No upper extremity supported;During functional activity Standing balance-Leahy Scale: Good                             ADL either performed or assessed with clinical judgement   ADL Overall ADL's : Needs assistance/impaired Eating/Feeding: Set up;Sitting   Grooming: Brushing hair;Wash/dry face;Set up;Sitting   Upper Body Bathing: Set up;Sitting   Lower Body Bathing: Min guard;Sit to/from stand   Upper Body Dressing : Set up;Sitting   Lower Body Dressing: Min guard;Sit to/from stand Lower Body Dressing Details (indicate cue type and reason): Pt donning socks with figure four method. Min Guard A for safety in standing Toilet Transfer: Min guard;Ambulation (simulated to recliner)           Functional mobility during ADLs: Min guard General ADL Comments: Pt performing ADLs and functional mobility at [employer] A level. Pt     Vision Baseline Vision/History: Wears glasses Wears Glasses: Reading only Patient Visual Report: No change from baseline       Perception     Praxis      Pertinent Vitals/Pain Pain Assessment: No/denies pain     Hand Dominance Right   Extremity/Trunk Assessment Upper Extremity Assessment Upper Extremity Assessment: Overall WFL for tasks assessed   Lower Extremity Assessment Lower Extremity Assessment: Defer to PT evaluation   Cervical / Trunk Assessment Cervical / Trunk Assessment: Kyphotic   Communication Communication Communication: No difficulties   Cognition Arousal/Alertness: Awake/alert Behavior During Therapy: WFL for tasks assessed/performed;Flat affect Overall Cognitive  Status: Impaired/Different from baseline                                 General Comments: Baseline mild dementia and parkinson's. Pt presenting with flat affect and decreased awareness of deficits. Noting pt inconsistent at times when reporting  PLOF.   General Comments  VSS    Exercises     Shoulder Instructions      Home Living Family/patient expects to be discharged to:: Private residence Living Arrangements: Alone Available Help at Discharge: Family;Available PRN/intermittently Type of Home: House Home Access: Stairs to enter [employer] of Steps: 2-3 Entrance Stairs-Rails: None Home Layout: One level     Bathroom Shower/Tub: Occupational psychologist: Standard     Home Equipment: Shower seat - built in;Walker - 2 wheels          Prior Functioning/Environment Level of Independence: Independent        Comments: pt reports driving, do all ADLs for self        OT Problem List: Decreased strength;Decreased range of motion;Decreased activity tolerance;Impaired balance (sitting and/or standing);Decreased cognition;Decreased safety awareness;Decreased knowledge of use of DME or AE      OT Treatment/Interventions: Self-care/ADL training;Therapeutic exercise;Energy conservation;DME and/or AE instruction;Therapeutic activities;Balance training;Patient/family education    OT Goals(Current goals can be found in the care plan section) Acute Rehab OT Goals Patient Stated Goal: Agreeable to OOB OT Goal Formulation: With patient Time For Goal Achievement: 01/30/21 Potential to Achieve Goals: Good  OT Frequency: Min 2X/week   Barriers to D/C:            Co-evaluation PT/OT/SLP Co-Evaluation/Treatment: Yes Reason for Co-Treatment: For patient/therapist safety;To address functional/ADL transfers   OT goals addressed during session: ADL's and self-care      AM-PAC OT "6 Clicks" Daily Activity     Outcome Measure Help from another person eating meals?: A Little Help from another person taking care of personal grooming?: A Little Help from another person toileting, which includes using toliet, bedpan, or urinal?: A Little Help from another person bathing (including washing, rinsing,  drying)?: A Little Help from another person to put on and taking off regular upper body clothing?: A Little Help from another person to put on and taking off regular lower body clothing?: A Little 6 Click Score: 18   End of Session Equipment Utilized During Treatment: Gait belt;Rolling walker Nurse Communication: Mobility status;Other (comment) (LUE IV out)  Activity Tolerance: Patient tolerated treatment well Patient left: in chair;with call bell/phone within reach;with chair alarm set  OT Visit Diagnosis: Unsteadiness on feet (R26.81);Other abnormalities of gait and mobility (R26.89);Muscle weakness (generalized) (M62.81)                Time: 1937-9024 OT Time Calculation (min): 24 min Charges:  OT General Charges $OT Visit: 1 Visit OT Evaluation $OT Eval Moderate Complexity: Brandon, OTR/L Acute Rehab Pager: [phone] Office: [place] 01/16/2021, 5:12 PM

## 2021-01-16 NOTE — Evaluation (Signed)
Physical Therapy Evaluation Patient Details Name: John [name] MRN: [id number] DOB: 1934-12-26 Today's Date: 01/16/2021   History of Present Illness  86 yo male admitted to [facility] on 3/24 with fall on 3/19 and again on 3/23, pt's daughter found pt down on 3/24 and pt states he had been crawling since fall. CTH shows bilateral frontal intraparenchymal hemorrhage with a subarachnoid component and IVH, no midline shift. PMH includes CAD, s/p CABG, aortic insufficiency, hypotension on florinef, DM2 (diet managed), mild dementia, parkinsonism, anxiety/depression.  Clinical Impression   Pt presents with generalized weakness, impaired standing balance, decreased activity tolerance, and headache type pain. Pt to benefit from acute PT to address deficits. Pt ambulated hallway distance with close guard and without AD, per pt he feels slightly weaker than baseline. PT recommending HHPT and supervision of family during all mobility, pt states he can stay with family or vice versa at d/c. PT to progress mobility as tolerated, and will continue to follow acutely.      Follow Up Recommendations Home health PT;Supervision for mobility/OOB    Equipment Recommendations  None recommended by PT    Recommendations for Other Services       Precautions / Restrictions Precautions Precautions: Fall Restrictions Weight Bearing Restrictions: No      Mobility  Bed Mobility Overal bed mobility: Needs Assistance Bed Mobility: Rolling;Sidelying to Sit Rolling: Min guard Sidelying to sit: Min assist       General bed mobility comments: Min A for intiating bringing BLES towards EOB.    Transfers Overall transfer level: Needs assistance   Transfers: Sit to/from Stand Sit to Stand: Min assist;Min guard         General transfer comment: Initial Min A for safety and balance. progressing to [employer] A for safety  Ambulation/Gait Ambulation/Gait assistance: Min assist;Min guard Gait Distance (Feet):  250 Feet Assistive device: Rolling walker (2 wheeled);1 person hand held assist;None Gait Pattern/deviations: Step-through pattern;Decreased stride length;Trunk flexed Gait velocity: decr   General Gait Details: initially min assist for steadying and navigating RW, transitioning to HHA, then min guard with no device. Verbal cuing for hallway navigation  Stairs            Wheelchair Mobility    Modified Rankin (Stroke Patients Only)       Balance Overall balance assessment: Needs assistance Sitting-balance support: Feet supported;No upper extremity supported Sitting balance-Leahy Scale: Good     Standing balance support: No upper extremity supported;During functional activity Standing balance-Leahy Scale: Fair               High level balance activites: Head turns;Direction changes High Level Balance Comments: Weaving of gait with horizontal and vertical head turns, directional changes with min unsteadiness             Pertinent Vitals/Pain Pain Assessment: 0-10 Pain Score: 4  Pain Location: headache Pain Descriptors / Indicators: Headache Pain Intervention(s): Limited activity within patient's tolerance;Monitored during session    Home Living Family/patient expects to be discharged to:: Private residence Living Arrangements: Alone Available Help at Discharge: Family;Available PRN/intermittently Type of Home: House Home Access: Stairs to enter Entrance Stairs-Rails: None Entrance Stairs-Number of Steps: 2-3 Home Layout: One level Home Equipment: Shower seat - built in;Walker - 2 wheels      Prior Function Level of Independence: Independent         Comments: pt reports driving, do all ADLs for self     Hand Dominance   Dominant Hand: Right  Extremity/Trunk Assessment   Upper Extremity Assessment Upper Extremity Assessment: Defer to OT evaluation    Lower Extremity Assessment Lower Extremity Assessment: Generalized weakness     Cervical / Trunk Assessment Cervical / Trunk Assessment: Kyphotic  Communication   Communication: No difficulties  Cognition Arousal/Alertness: Awake/alert Behavior During Therapy: WFL for tasks assessed/performed;Flat affect Overall Cognitive Status: Impaired/Different from baseline                                 General Comments: Baseline mild dementia and parkinson's. Pt presenting with flat affect and decreased awareness of deficits. Noting pt inconsistent at times when reporting PLOF.      General Comments General comments (skin integrity, edema, etc.): VSS    Exercises     Assessment/Plan    PT Assessment Patient needs continued PT services  PT Problem List Decreased strength;Decreased mobility;Decreased activity tolerance;Decreased balance;Decreased knowledge of use of DME;Pain;Decreased cognition;Decreased safety awareness       PT Treatment Interventions DME instruction;Therapeutic activities;Gait training;Therapeutic exercise;Patient/family education;Balance training;Stair training;Neuromuscular re-education;Functional mobility training    PT Goals (Current goals can be found in the Care Plan section)  Acute Rehab PT Goals Patient Stated Goal: be independent PT Goal Formulation: With patient Time For Goal Achievement: 01/30/21 Potential to Achieve Goals: Good    Frequency Min 4X/week   Barriers to discharge        Co-evaluation PT/OT/SLP Co-Evaluation/Treatment: Yes Reason for Co-Treatment: For patient/therapist safety;To address functional/ADL transfers PT goals addressed during session: Mobility/safety with mobility;Balance OT goals addressed during session: ADL's and self-care       AM-PAC PT "6 Clicks" Mobility  Outcome Measure Help needed turning from your back to your side while in a flat bed without using bedrails?: A Little Help needed moving from lying on your back to sitting on the side of a flat bed without using bedrails?: A  Little Help needed moving to and from a bed to a chair (including a wheelchair)?: A Little Help needed standing up from a chair using your arms (e.g., wheelchair or bedside chair)?: A Little Help needed to walk in hospital room?: A Little Help needed climbing 3-5 steps with a railing? : A Little 6 Click Score: 18    End of Session   Activity Tolerance: Patient tolerated treatment well Patient left: in chair;with call bell/phone within reach;with chair alarm set Nurse Communication: Mobility status PT Visit Diagnosis: Other abnormalities of gait and mobility (R26.89);Difficulty in walking, not elsewhere classified (R26.2)    Time: 9163-8466 PT Time Calculation (min) (ACUTE ONLY): 24 min   Charges:   PT Evaluation $PT Eval Low Complexity: 1 Low         Irie Fiorello S, PT Acute Rehabilitation Services Pager [phone]  Office [phone]  Louis Matte 01/16/2021, 5:35 PM

## 2021-01-17 ENCOUNTER — Other Ambulatory Visit: Payer: Self-pay | Admitting: Internal Medicine

## 2021-01-17 DIAGNOSIS — I629 Nontraumatic intracranial hemorrhage, unspecified: Secondary | ICD-10-CM | POA: Diagnosis not present

## 2021-01-17 LAB — URINE CULTURE: Culture: 10000 — AB

## 2021-01-17 LAB — CBC
HCT: 35.6 % — ABNORMAL LOW (ref 39.0–52.0)
Hemoglobin: 12.6 g/dL — ABNORMAL LOW (ref 13.0–17.0)
MCH: 33.5 pg (ref 26.0–34.0)
MCHC: 35.4 g/dL (ref 30.0–36.0)
MCV: 94.7 fL (ref 80.0–100.0)
Platelets: 176 10*3/uL (ref 150–400)
RBC: 3.76 MIL/uL — ABNORMAL LOW (ref 4.22–5.81)
RDW: 13.6 % (ref 11.5–15.5)
WBC: 6.3 10*3/uL (ref 4.0–10.5)
nRBC: 0 % (ref 0.0–0.2)

## 2021-01-17 LAB — CK: Total CK: 1006 U/L — ABNORMAL HIGH (ref 49–397)

## 2021-01-17 LAB — BASIC METABOLIC PANEL
Anion gap: 9 (ref 5–15)
BUN: 14 mg/dL (ref 8–23)
CO2: 20 mmol/L — ABNORMAL LOW (ref 22–32)
Calcium: 8.2 mg/dL — ABNORMAL LOW (ref 8.9–10.3)
Chloride: 111 mmol/L (ref 98–111)
Creatinine, Ser: 1.2 mg/dL (ref 0.61–1.24)
GFR, Estimated: 60 mL/min — ABNORMAL LOW (ref >60)
Glucose, Bld: 90 mg/dL (ref 70–99)
Potassium: 3.8 mmol/L (ref 3.5–5.1)
Sodium: 140 mmol/L (ref 135–145)

## 2021-01-17 LAB — PHOSPHORUS: Phosphorus: 2.8 mg/dL (ref 2.5–4.6)

## 2021-01-17 LAB — MAGNESIUM: Magnesium: 2.1 mg/dL (ref 1.7–2.4)

## 2021-01-17 MED ORDER — HYDRALAZINE HCL 10 MG PO TABS
10.0000 mg | ORAL_TABLET | Freq: Four times a day (QID) | ORAL | Status: DC | PRN
  Filled 2021-01-17: qty 1

## 2021-01-17 MED ORDER — ERYTHROMYCIN 5 MG/GM OP OINT
TOPICAL_OINTMENT | Freq: Four times a day (QID) | OPHTHALMIC | Status: DC
  Administered 2021-01-19 (×2): 1 via OPHTHALMIC
  Filled 2021-01-17: qty 3.5

## 2021-01-17 MED ORDER — POTASSIUM CHLORIDE CRYS ER 20 MEQ PO TBCR
40.0000 meq | EXTENDED_RELEASE_TABLET | Freq: Once | ORAL | Status: AC
  Administered 2021-01-17: 40 meq via ORAL
  Filled 2021-01-17: qty 2

## 2021-01-17 MED ORDER — HYDRALAZINE HCL 25 MG PO TABS: 25.0000 mg | ORAL_TABLET | Freq: Three times a day (TID) | ORAL | Status: DC

## 2021-01-17 MED ORDER — HYDRALAZINE HCL 10 MG PO TABS
10.0000 mg | ORAL_TABLET | Freq: Three times a day (TID) | ORAL | Status: DC
  Administered 2021-01-17 – 2021-01-19 (×7): 10 mg via ORAL
  Filled 2021-01-17 (×8): qty 1

## 2021-01-17 NOTE — Progress Notes (Signed)
NAME:  John [name], MRN:  [id number], DOB:  07-03-1935, LOS: 2 ADMISSION DATE:  01/15/2021, CONSULTATION DATE: 3/24 REFERRING MD: Dr. Ralene Bathe, CHIEF COMPLAINT: ICH  History of Present Illness:  86 year old male with past medical history as below, which is significant for coronary artery disease status post CABG in 1996, aortic insufficiency, chronic hypotension on Florinef, DM2 managed with diet, and mild dementia.  The patient is a poor historian secondary to acute condition causing confusion.  He was presumably in his usual state of health until Saturday 3/19 or Sunday 3/20.  He suffered a fall striking his head either that Saturday night with a Sunday morning before church.  He seemed to be "not right" and stumbling at church that Sunday and again the following Wednesday night.  When he fell he reportedly laid on the ground for approximately 3 hours and was unable to get up.  When his daughter went to check on him 3/24 his house was very disheveled in the microwave was smoking with a metal pot inside.  He was confused prompting them to present to [place].  CT scan was done and showed multiple foci of hemorrhagic stroke prompting transfer to [facility] emergency department.  At [facility] he was evaluated by neurosurgery for intracranial hemorrhage and subarachnoid hemorrhage.  No surgical interventions were recommended and he was admitted to [facility].  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   Significant Hospital Events: Including procedures, antibiotic start and stop dates in addition to other pertinent events   . 3/24 admitted to [facility] for intracranial hemorrhage and subarachnoid hemorrhage.  Interim History / Subjective:  No events, headache improved. Denies complaints.  Objective   Blood pressure (!) 152/48, pulse (!) 52, temperature 98.8 F (37.1 C), temperature source Oral, resp. rate 15, height 6' (1.829 m), weight 75.1 kg, SpO2 97 %.        Intake/Output Summary (Last 24 hours) at 01/17/2021 5993 Last data filed at 01/17/2021 0700 Gross per 24 hour  Intake 2463.52 ml  Output 1850 ml  Net 613.52 ml   Filed Weights   01/15/21 1540 01/16/21 0500 01/17/21 0300  Weight: 78 kg 75.7 kg 75.1 kg    Examination: Constitutional: no acute distress laying in bed  Eyes: EOMI, pupils eqal Ears, nose, mouth, and throat: MMM, trachea midline Cardiovascular: brady, occasional PVCs, ext warm Respiratory: clear, no wheezing Gastrointestinal: soft, +BS Skin: No rashes, normal turgor, midline sternotomy scar Neurologic: moves all 4 ext to command, pill rolling tremor R hand, flat facies all c/w classic parkinson's Psychiatric: AOx3   Labs/imaging that I havepersonally reviewed    CK/Cr improving with hydration K low repleting CBC benign CBG okay CT head stable frontal hemorrhages  Resolved Hospital Problem list     Assessment & Plan:  - Presumed traumatic intracranial hemorrhage.  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  - HLD  - GERD  - Hx of SVT- having some ectopy here - Hx of stroke and TIA although his MRI in 2016 shows no evidence of prior strokes  - Hypokalemia repleted  - Conjunctivits improving - Hx of BPH- on tamsulosin PTA which probably made his orthostasis worse  - Add low dose hydralazine to amlodipine goal SBP <140 - Erythromycin eye drops as ordered - Continue IV hydration, encourage PO - AEDs per neurology, maybe 1 week? Defer to them - Have pared down his home med list, would keep him on the least number of interacting drugs as possible; I do wonder if his functional status would improve with sinemet but defer to PCP - PT recommends HH, OT is a little more unclear; daughter is not sure he can take care of himself at home - Transfer to tele, remaining issue is assuring BP control and assuring he can do his ADLs on DC; appreciate [facility] taking over care  Erskine Emery MD PCCM

## 2021-01-17 NOTE — Progress Notes (Signed)
Subjective: Patient reports that he is doing well. No acute events overnight.   Objective: Vital signs in last 24 hours: Temp:  [98.3 F (36.8 C)-98.8 F (37.1 C)] 98.8 F (37.1 C) (03/26 0400) Pulse Rate:  [32-172] 32 (03/26 0500) Resp:  [0-23] 13 (03/26 0500) BP: (103-140)/(50-84) 118/59 (03/26 0500) SpO2:  [95 %-98 %] 97 % (03/26 0600) Weight:  [75.1 kg] 75.1 kg (03/26 0300)  Intake/Output from previous day: 03/25 0701 - 03/26 0700 In: 2363.5 [P.O.:240; I.V.:2123.5] Out: 1850 [Urine:1850] Intake/Output this shift: Total I/O In: 4782 [P.O.:240; I.V.:1197] Out: 1850 [Urine:1850]  Physical Exam: Patient was asleep on my arrival. He awoke to voice easily. A/O X 4, conversant, and in good spirits. Following commands. Speech is fluent and appropriate. Doing well. MAEW with good strength that is symmetric bilaterally. CN's grossly intract. PERRLA         Lab Results: Recent Labs    01/16/21 0325 01/17/21 0157  WBC 8.5 6.3  HGB 12.5* 12.6*  HCT 36.5* 35.6*  PLT 180 176   BMET Recent Labs    01/16/21 0325 01/17/21 0157  NA 138 140  K 3.7 3.8  CL 109 111  CO2 21* 20*  GLUCOSE 91 90  BUN 16 14  CREATININE 1.50* 1.20  CALCIUM 8.3* 8.2*    Studies/Results: DG Chest 1 View  Result Date: 01/15/2021 CLINICAL DATA:  Cough, fell backwards striking head, bruising and bleeding at posterior RIGHT elbow EXAM: CHEST  1 VIEW COMPARISON:  12/20/2011 FINDINGS: Normal heart size post CABG. Moderate-sized hiatal hernia. Atherosclerotic calcification aorta. Lungs clear. No pulmonary infiltrate, pleural effusion, or pneumothorax. Osseous demineralization with evidence of prior cervical spine fusion. IMPRESSION: Hiatal hernia. No acute abnormalities. Aortic Atherosclerosis (ICD10-I70.0). Electronically Signed   By: Lavonia Dana M.D.   On: 01/15/2021 16:55   DG Pelvis 1-2 Views  Result Date: 01/15/2021 CLINICAL DATA:  [place] backwards striking head EXAM: PELVIS - 1-2 VIEW COMPARISON:   07/20/2019 FINDINGS: Osseous demineralization. Hip and SI joint spaces preserved. No acute fracture, dislocation, or bone destruction. IMPRESSION: No acute abnormalities. Electronically Signed   By: Lavonia Dana M.D.   On: 01/15/2021 16:56   CT HEAD WO CONTRAST  Result Date: 01/16/2021 CLINICAL DATA:  Follow-up intracranial hemorrhage EXAM: CT HEAD WITHOUT CONTRAST TECHNIQUE: Contiguous axial images were obtained from the base of the skull through the vertex without intravenous contrast. COMPARISON:  01/15/2021 FINDINGS: Brain: No evidence of additional or new bleeding. No visible hemorrhage in the posterior fossa. Subarachnoid blood on both sides, largest amount in the left sylvian fissure sulci. Numerous intraparenchymal hematomas in both frontal lobes, the largest in the right measuring up to 3.3 cm in diameter as seen yesterday. Surrounding associated edema is very similar. Slightly more blood layering dependently in the occipital horns of both lateral ventricles, expected. Ventricular size has not changed. Mild chronic small-vessel ischemic changes of the hemispheric white matter as a background pattern. Vascular: There is atherosclerotic calcification of the major vessels at the base of the brain. Skull: No fracture. Sinuses/Orbits: Clear/normal Other: None IMPRESSION: 1. No evidence of additional or new bleeding. 2. Numerous intraparenchymal hematomas in both frontal lobes, the largest in the right frontal lobe measuring up to 3.3 cm in diameter. Surrounding associated edema is very similar. 3. Subarachnoid blood on both sides, largest amount in the left Sylvian fissure sulci. Slightly more blood layering dependently in the occipital horns of both lateral ventricles, expected. Ventricular size has not changed. Electronically Signed   By: Elta Guadeloupe  Shogry M.D.   On: 01/16/2021 13:33   CT Head Wo Contrast  Result Date: 01/15/2021 CLINICAL DATA:  Unwitnessed fall. EXAM: CT HEAD WITHOUT CONTRAST CT CERVICAL  SPINE WITHOUT CONTRAST TECHNIQUE: Multidetector CT imaging of the head and cervical spine was performed following the standard protocol without intravenous contrast. Multiplanar CT image reconstructions of the cervical spine were also generated. COMPARISON:  September 10, 2015. FINDINGS: CT HEAD FINDINGS Brain: Moderate amount of subarachnoid hemorrhage is noted in the left sylvian fissure. 3.5 x 2.4 cm right frontal intraparenchymal hemorrhage is noted. Smaller left frontal intraparenchymal hemorrhage is noted. Small amount of subarachnoid hemorrhage is also noted in the right frontal region. Mild chronic ischemic white matter disease is noted. Ventricular size is within normal limits. Small amount of intraventricular hemorrhage is noted in the left posterior horn. No midline shift is noted. No mass lesion is noted. Vascular: No hyperdense vessel or unexpected calcification. Skull: Normal. Negative for fracture or focal lesion. Sinuses/Orbits: No acute finding. Other: None. CT CERVICAL SPINE FINDINGS Alignment: Normal. Skull base and vertebrae: No acute fracture. No primary bone lesion or focal pathologic process. Soft tissues and spinal canal: No prevertebral fluid or swelling. No visible canal hematoma. Disc levels:  Status post surgical anterior fusion of C5-6 and C6-7. Upper chest: Negative. Other: Severe degenerative changes seen involving the left-sided posterior facet joint of C3-4. IMPRESSION: 1. Bilateral frontal intraparenchymal hemorrhages are noted, with the largest on the right measuring 3.5 x 2.4 cm. Moderate amount of subarachnoid hemorrhage is noted in left sylvian fissure. Small amount of subarachnoid hemorrhage is also noted in the right frontal region. Small amount of intraventricular hemorrhage is noted in the left posterior horn. No midline shift is noted. Critical Value/emergent results were called by telephone at the time of interpretation on 01/15/2021 at 4:49 pm to provider [facility] ,  who verbally acknowledged these results. 2. Postsurgical and degenerative changes are noted in the cervical spine. No fracture or spondylolisthesis is noted. Electronically Signed   By: Marijo Conception M.D.   On: 01/15/2021 16:49   CT Cervical Spine Wo Contrast  Result Date: 01/15/2021 CLINICAL DATA:  Unwitnessed fall. EXAM: CT HEAD WITHOUT CONTRAST CT CERVICAL SPINE WITHOUT CONTRAST TECHNIQUE: Multidetector CT imaging of the head and cervical spine was performed following the standard protocol without intravenous contrast. Multiplanar CT image reconstructions of the cervical spine were also generated. COMPARISON:  September 10, 2015. FINDINGS: CT HEAD FINDINGS Brain: Moderate amount of subarachnoid hemorrhage is noted in the left sylvian fissure. 3.5 x 2.4 cm right frontal intraparenchymal hemorrhage is noted. Smaller left frontal intraparenchymal hemorrhage is noted. Small amount of subarachnoid hemorrhage is also noted in the right frontal region. Mild chronic ischemic white matter disease is noted. Ventricular size is within normal limits. Small amount of intraventricular hemorrhage is noted in the left posterior horn. No midline shift is noted. No mass lesion is noted. Vascular: No hyperdense vessel or unexpected calcification. Skull: Normal. Negative for fracture or focal lesion. Sinuses/Orbits: No acute finding. Other: None. CT CERVICAL SPINE FINDINGS Alignment: Normal. Skull base and vertebrae: No acute fracture. No primary bone lesion or focal pathologic process. Soft tissues and spinal canal: No prevertebral fluid or swelling. No visible canal hematoma. Disc levels:  Status post surgical anterior fusion of C5-6 and C6-7. Upper chest: Negative. Other: Severe degenerative changes seen involving the left-sided posterior facet joint of C3-4. IMPRESSION: 1. Bilateral frontal intraparenchymal hemorrhages are noted, with the largest on the right measuring 3.5 x  2.4 cm. Moderate amount of subarachnoid  hemorrhage is noted in left sylvian fissure. Small amount of subarachnoid hemorrhage is also noted in the right frontal region. Small amount of intraventricular hemorrhage is noted in the left posterior horn. No midline shift is noted. Critical Value/emergent results were called by telephone at the time of interpretation on 01/15/2021 at 4:49 pm to provider [facility] , who verbally acknowledged these results. 2. Postsurgical and degenerative changes are noted in the cervical spine. No fracture or spondylolisthesis is noted. Electronically Signed   By: Marijo Conception M.D.   On: 01/15/2021 16:49    Assessment/Plan: Patient is doing well. Following commands. Ambulating in the hallway well with nursing staff. Clevidipine turned off yesterday. BP's continue to be stable with no use of PRN antihypertensives. SBP's running between 120-130 mm Hg. Follow up CT head from yesterday appears stable. Potential transfer out of the ICU today.    LOS: 2 days     Marvis Moeller, DNP, NP-C 01/17/2021, 6:35 AM

## 2021-01-17 NOTE — Progress Notes (Signed)
Dictation on: 01/17/2021 12:00 AM by: Ashok Pall [[id number]]

## 2021-01-17 NOTE — Progress Notes (Signed)
Physical Therapy Treatment Patient Details Name: John [name] MRN: [id number] DOB: [birth year] Today's Date: 01/17/2021    History of Present Illness 86 yo male admitted to [facility] on 3/24 with fall on 3/19 and again on 3/23, pt's daughter found pt down on 3/24 and pt states he had been crawling since fall. CTH shows bilateral frontal intraparenchymal hemorrhage with a subarachnoid component and IVH, no midline shift. PMH includes CAD, s/p CABG, aortic insufficiency, hypotension on florinef, DM2 (diet managed), mild dementia, parkinsonism, anxiety/depression.    PT Comments    Patient requires minA throughout ambulation for balance as patient with numerous LOB during ambulation primarily with direction changes, head turns, and stepping over obstacles. Patient negotiated 2 stairs with B handrails and min guard. Patient stated at end of session that he would like to use RW as he wants to have something to hold on to. Inquired about use of cane and patient adamant about using RW. Continue to recommend HHPT following discharge to maximize functional mobility and safety.     Follow Up Recommendations  Home health PT;Supervision for mobility/OOB     Equipment Recommendations  None recommended by PT    Recommendations for Other Services       Precautions / Restrictions Precautions Precautions: Fall Restrictions Weight Bearing Restrictions: No    Mobility  Bed Mobility Overal bed mobility: Needs Assistance Bed Mobility: Supine to Sit;Sit to Supine     Supine to sit: Supervision Sit to supine: Supervision   General bed mobility comments: supervision for safety    Transfers Overall transfer level: Needs assistance Equipment used: None Transfers: Sit to/from Stand Sit to Stand: Min guard         General transfer comment: min guard for safety  Ambulation/Gait Ambulation/Gait assistance: Min assist Gait Distance (Feet): 300 Feet Assistive device: None Gait  Pattern/deviations: Step-through pattern;Decreased stride length;Drifts right/left Gait velocity: decr   General Gait Details: required minA throughout with drifting L/R and numerous small LOB requiring minA to steady. Patient reports he wants to use RW   Stairs Stairs: Yes Stairs assistance: Min guard Stair Management: Two rails;Step to pattern;Forwards Number of Stairs: 2     Wheelchair Mobility    Modified Rankin (Stroke Patients Only)       Balance Overall balance assessment: Needs assistance Sitting-balance support: Feet supported;No upper extremity supported Sitting balance-Leahy Scale: Good     Standing balance support: No upper extremity supported;During functional activity Standing balance-Leahy Scale: Fair               High level balance activites: Turns;Other (comment) (stepping over obstacles) High Level Balance Comments: Weaving of gait with horizontal and vertical head turns, directional changes with min unsteadiness            Cognition Arousal/Alertness: Awake/alert Behavior During Therapy: WFL for tasks assessed/performed;Flat affect Overall Cognitive Status: Impaired/Different from baseline                                 General Comments: Baseline mild dementia and parkinson's. Pt presenting with flat affect and decreased awareness of deficits. Difficulty following directions      Exercises      General Comments        Pertinent Vitals/Pain Pain Assessment: Faces Faces Pain Scale: Hurts a little bit Pain Location: headache Pain Descriptors / Indicators: Headache Pain Intervention(s): Monitored during session    Home Living  Prior Function            PT Goals (current goals can now be found in the care plan section) Acute Rehab PT Goals Patient Stated Goal: be independent PT Goal Formulation: With patient Time For Goal Achievement: 01/30/21 Potential to Achieve Goals:  Good Progress towards PT goals: Progressing toward goals    Frequency    Min 4X/week      PT Plan Current plan remains appropriate    Co-evaluation              AM-PAC PT "6 Clicks" Mobility   Outcome Measure  Help needed turning from your back to your side while in a flat bed without using bedrails?: A Little Help needed moving from lying on your back to sitting on the side of a flat bed without using bedrails?: A Little Help needed moving to and from a bed to a chair (including a wheelchair)?: A Little Help needed standing up from a chair using your arms (e.g., wheelchair or bedside chair)?: A Little Help needed to walk in hospital room?: A Little Help needed climbing 3-5 steps with a railing? : A Little 6 Click Score: 18    End of Session Equipment Utilized During Treatment: Gait belt Activity Tolerance: Patient tolerated treatment well Patient left: in bed;with call bell/phone within reach;with bed alarm set;with family/visitor present Nurse Communication: Mobility status PT Visit Diagnosis: Other abnormalities of gait and mobility (R26.89);Difficulty in walking, not elsewhere classified (R26.2)     Time: 6967-8938 PT Time Calculation (min) (ACUTE ONLY): 25 min  Charges:  $Gait Training: 8-22 mins $Therapeutic Activity: 8-22 mins                     Keshawn Fiorito A. Gilford Rile PT, DPT Acute Rehabilitation Services Pager [phone] Office [phone]    Linna Hoff 01/17/2021, 5:03 PM

## 2021-01-18 LAB — BASIC METABOLIC PANEL
Anion gap: 5 (ref 5–15)
BUN: 13 mg/dL (ref 8–23)
CO2: 25 mmol/L (ref 22–32)
Calcium: 8.5 mg/dL — ABNORMAL LOW (ref 8.9–10.3)
Chloride: 110 mmol/L (ref 98–111)
Creatinine, Ser: 1.29 mg/dL — ABNORMAL HIGH (ref 0.61–1.24)
GFR, Estimated: 55 mL/min — ABNORMAL LOW (ref >60)
Glucose, Bld: 105 mg/dL — ABNORMAL HIGH (ref 70–99)
Potassium: 4.8 mmol/L (ref 3.5–5.1)
Sodium: 140 mmol/L (ref 135–145)

## 2021-01-18 LAB — CBC
HCT: 37.8 % — ABNORMAL LOW (ref 39.0–52.0)
Hemoglobin: 12.8 g/dL — ABNORMAL LOW (ref 13.0–17.0)
MCH: 33.2 pg (ref 26.0–34.0)
MCHC: 33.9 g/dL (ref 30.0–36.0)
MCV: 98.2 fL (ref 80.0–100.0)
Platelets: 176 10*3/uL (ref 150–400)
RBC: 3.85 MIL/uL — ABNORMAL LOW (ref 4.22–5.81)
RDW: 13.8 % (ref 11.5–15.5)
WBC: 6.4 10*3/uL (ref 4.0–10.5)
nRBC: 0 % (ref 0.0–0.2)

## 2021-01-18 LAB — PHOSPHORUS: Phosphorus: 2.9 mg/dL (ref 2.5–4.6)

## 2021-01-18 LAB — MAGNESIUM: Magnesium: 1.9 mg/dL (ref 1.7–2.4)

## 2021-01-18 MED ORDER — VITAMIN D 25 MCG (1000 UNIT) PO TABS
8000.0000 [IU] | ORAL_TABLET | Freq: Every day | ORAL | Status: DC
  Administered 2021-01-19: 8000 [IU] via ORAL
  Filled 2021-01-18 (×2): qty 8

## 2021-01-18 MED ORDER — TUBERCULIN PPD 5 UNIT/0.1ML ID SOLN
5.0000 [IU] | Freq: Once | INTRADERMAL | Status: DC
  Administered 2021-01-18: 5 [IU] via INTRADERMAL
  Filled 2021-01-18: qty 0.1

## 2021-01-18 MED ORDER — ESCITALOPRAM OXALATE 10 MG PO TABS
10.0000 mg | ORAL_TABLET | Freq: Every day | ORAL | Status: DC
  Administered 2021-01-19: 10 mg via ORAL
  Filled 2021-01-18: qty 1

## 2021-01-18 MED ORDER — SIMVASTATIN 20 MG PO TABS
20.0000 mg | ORAL_TABLET | Freq: Every day | ORAL | Status: DC
  Administered 2021-01-18: 20 mg via ORAL
  Filled 2021-01-18: qty 1

## 2021-01-18 MED ORDER — LOPERAMIDE HCL 2 MG PO CAPS
2.0000 mg | ORAL_CAPSULE | ORAL | Status: DC | PRN
  Administered 2021-01-18: 2 mg via ORAL
  Filled 2021-01-18: qty 1

## 2021-01-18 MED ORDER — TAMSULOSIN HCL 0.4 MG PO CAPS
0.4000 mg | ORAL_CAPSULE | Freq: Every day | ORAL | Status: DC
  Administered 2021-01-18: 0.4 mg via ORAL
  Filled 2021-01-18: qty 1

## 2021-01-18 MED ORDER — FINASTERIDE 5 MG PO TABS
5.0000 mg | ORAL_TABLET | Freq: Every day | ORAL | Status: DC
  Administered 2021-01-19: 5 mg via ORAL
  Filled 2021-01-18: qty 1

## 2021-01-18 MED ORDER — TRAMADOL HCL 50 MG PO TABS
50.0000 mg | ORAL_TABLET | Freq: Four times a day (QID) | ORAL | Status: AC | PRN
  Administered 2021-01-18 – 2021-01-19 (×2): 50 mg via ORAL
  Filled 2021-01-18 (×2): qty 1

## 2021-01-18 NOTE — Progress Notes (Signed)
Spoke w/ Dr. Fenton Malling pertaining to the pt's HR. V.O. "ok to give 1mg  Atropine if HR sustains in 30's". Will conti to monitor.

## 2021-01-18 NOTE — TOC Initial Note (Signed)
Transition of Care [facility]) - Initial/Assessment Note    Patient Details  Name: John [name] MRN: [id number] Date of Birth: 1935-03-04  Transition of Care [facility] CM/SW Contact:    Pollie Friar, RN Phone Number: 01/18/2021, 11:29 AM  Clinical Narrative:                 Patient is from home alone and has been having falls. Pt ambulating 300 feet with PT. CM met with the patient, daughter and SIL. CM provide them choice for [facility] and resources for ALF. Daughter plans on arranging caregivers for home until she can get him into an ALF.  Daughter states the patient has a walker at home.  Family to provide transport home when medically ready.   Expected Discharge Plan: [place] Barriers to Discharge: Continued Medical Work up   Patient Goals and CMS Choice   CMS Medicare.gov Compare Post Acute Care list provided to:: Patient Represenative (must comment) Choice offered to / list presented to : Adult Children  Expected Discharge Plan and Services Expected Discharge Plan: [place]   Discharge Planning Services: CM Consult Post Acute Care Choice: [place] arrangements for the past 2 months: [place]: PT,OT,Social Work [place]: [place] Date [place]: 01/18/21   Representative spoke with at [place]: Tommi Rumps  Prior Living Arrangements/Services Living arrangements for the past 2 months: [place] Lives with:: Self          Need for Family Participation in Patient Care: Yes (Comment) Care giver support system in place?: No (comment) Current home services: DME (walker) Criminal Activity/Legal Involvement Pertinent to Current Situation/Hospitalization: No - Comment as needed  Activities of Daily Living Home Assistive Devices/Equipment: Walker (specify type) ADL Screening (condition at time of admission) Weakness of Legs: None Weakness of  Arms/Hands: None  Permission Sought/Granted                  Emotional Assessment Appearance:: Appears stated age         Psych Involvement: No (comment)  Admission diagnosis:  ICH (intracerebral hemorrhage) ([place]) [I61.9] Intracranial bleed ([place]) [I62.9] Injury of head, initial encounter [S09.90XA] Patient Active Problem List   Diagnosis Date Noted  . Intracranial bleed ([place]) 01/15/2021  . SAH (subarachnoid hemorrhage) ([place])   . Acute conjunctivitis of both eyes   . Non-traumatic rhabdomyolysis   . Primary osteoarthritis of left knee 09/26/2019  . S/P left knee arthroscopy 09/26/2019  . 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 Adrenal insufficiency ([place]) 09/25/2019  . Acute medial meniscus tear of left knee 08/09/2019  . Insomnia 02/19/2018  . Diabetic peripheral neuropathy ([place]) 11/21/2017  . BMI 21.0-21.9, adult 09/04/2015  . Esophageal reflux 07/09/2015  . Autonomic postural hypotension 02/06/2015  . CKD stage G3a/A1, GFR 45-59 and albumin creatinine ratio <30 mg/g (HCC) 03/20/2014  . Medication management 02/06/2014  . Vitamin D deficiency 02/06/2014  . Gastric AV malformation 10/30/2012  . History of colonic polyps 09/14/2011  . Mixed hyperlipidemia 03/12/2009  . Essential hypertension 03/12/2009  . 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 ([place]) 03/12/2009  . History of TIA (transient ischemic attack) 03/12/2009  . Asthma 03/12/2009  . COPD (chronic obstructive pulmonary disease) ([place]) 03/12/2009  . GERD 03/12/2009  PCP:  McKeown, William, MD Pharmacy:   [id number] - [place], [place] - [address] [place] [address] Phone: [phone] Fax: [phone]  [facility] - [place], [place] - [address] [place] [address] Phone: [phone] Fax: [phone]  [id number] - [place], [place] - [address] [place] [address] Phone: [phone] Fax: [phone]  [id number] - [place], [place] - [address] [place] [address] Phone: [phone] Fax: [phone]  [facility] - [place], [place] - [address]  [place] [address] Phone: [phone] Fax: [phone]  [id number] - [place], [place] - [address] [place] [address] Phone: [phone] Fax: [phone]     Social Determinants of Health (SDOH) Interventions    Readmission Risk Interventions No flowsheet data found.  

## 2021-01-18 NOTE — Progress Notes (Addendum)
Pt noted to have HR in mid 30's-40's per telemetry unstained. But HR sitting in mid 50's pt asymptomatic throughout episodes AOx4. Neurosurgery on-call paged for update.

## 2021-01-18 NOTE — Plan of Care (Signed)

## 2021-01-18 NOTE — Progress Notes (Signed)
PROGRESS NOTE    John [name]  XBL:[id number] DOB: October 15, 1935 DOA: 01/15/2021 PCP: Unk Pinto, MD    Chief Complaint  Patient presents with  . Fall  . Altered Mental Status    Brief Narrative:   86 year old gentleman prior history of coronary artery disease s/p CABG, diabetes mellitus, dementia had a mechanical fall and hit his head.  Presents to ED with confusion and altered mental status.  CT of the head was done and showed multiple foci of hemorrhage, transferred to [facility] for further evaluation.  At [facility] he was evaluated by neurosurgery for intracranial hemorrhage and subarachnoid hemorrhage, no surgical interventions were recommended.  He was initially admitted to [facility] and transferred to [facility] on 01/18/2021.   Assessment & Plan:   Active Problems:   Intracranial bleed (HCC)  Traumatic intracranial hemorrhage secondary to a mechanical fall CT of the head showed subarachnoid hemorrhage. Neurosurgery on board recommended no surgical intervention at this time. Therapy evaluations are health PT Family would like to pursue ALF at this time. Skin tuberculin test ordered.     Mild acute on chronic kidney disease probably from rhabdomyolysis and dehydration Improving with IV fluids.   Stable coronary artery disease status post CABG Patient at home on Plavix which is being held for head bleed.     Hyperlipidemia Continue with statin.   ?  Parkinson's disease with dysautonomia Continue with Florinef on discharge.   Mild anemia of chronic disease Hemoglobin stable around 12.     DVT prophylaxis:scd's Code Status: (Full Code Family Communication: family at bedside.  Disposition:   Status is: Inpatient  Remains inpatient appropriate because:Unsafe d/c plan   Dispo: The patient is from: Home              Anticipated d/c is to: ALF              Patient currently is medically stable to d/c.   Difficult to place patient No       Level of care:  Telemetry Medical Consultants:  Neurology Neurosurgery.   Procedures:  CT of the head shows Numerous intraparenchymal hematomas in both frontal lobes, the largest in the right frontal lobe measuring up to 3.3 cm in diameter. Surrounding associated edema is very similar. 3. Subarachnoid blood on both sides, largest amount in the left Sylvian fissure sulci. Slightly more blood layering dependently in the occipital horns of both lateral ventricles, expected.  Ventricular size has not changed.   Antimicrobials: None.   Subjective: No new complaints.   Objective: Vitals:   01/18/21 0320 01/18/21 0446 01/18/21 0542 01/18/21 0804  BP: 128/76  (!) 154/63 (!) 117/57  Pulse: 65  (!) 58 (!) 59  Resp: 19   18  Temp: 98.2 F (36.8 C)  98.1 F (36.7 C) 97.8 F (36.6 C)  TempSrc: Oral  Oral   SpO2: 95%   95%  Weight:  75 kg    Height:        Intake/Output Summary (Last 24 hours) at 01/18/2021 1041 Last data filed at 01/18/2021 0800 Gross per 24 hour  Intake -  Output 2000 ml  Net -2000 ml   Filed Weights   01/16/21 0500 01/17/21 0300 01/18/21 0446  Weight: 75.7 kg 75.1 kg 75 kg    Examination:  General exam: Appears calm and comfortable  Respiratory system: Clear to auscultation. Respiratory effort normal. Cardiovascular system: S1 & S2 heard, RRR. No JVD, . No pedal edema. Gastrointestinal system: Abdomen is nondistended, soft  and nontender.Normal bowel sounds heard. Central nervous system: Alert , answering simple questions, following commands.  Extremities: no pedal edema. . Skin: No rashes, lesions or ulcers Psychiatry:mood is appropriate.     Data Reviewed: I have personally reviewed following labs and imaging studies  CBC: Recent Labs  Lab 01/15/21 1600 01/15/21 1612 01/16/21 0325 01/17/21 0157 01/18/21 0130  WBC 8.7  --  8.5 6.3 6.4  NEUTROABS 6.5  --   --   --   --   HGB 14.0 12.9* 12.5* 12.6* 12.8*  HCT 41.8 38.0* 36.5* 35.6* 37.8*  MCV 97.2  --   96.8 94.7 98.2  PLT 215  --  180 176 716    Basic Metabolic Panel: Recent Labs  Lab 01/15/21 1600 01/15/21 1612 01/16/21 0325 01/17/21 0157 01/18/21 0130  NA 142 141 138 140 140  K 3.9 4.1 3.7 3.8 4.8  CL 108  --  109 111 110  CO2 24  --  21* 20* 25  GLUCOSE 129*  --  91 90 105*  BUN 18  --  16 14 13   CREATININE 1.68*  --  1.50* 1.20 1.29*  CALCIUM 8.7*  --  8.3* 8.2* 8.5*  MG  --   --  1.9 2.1 1.9  PHOS  --   --  3.4 2.8 2.9    GFR: Estimated Creatinine Clearance: 45.2 mL/min (A) (by C-G formula based on SCr of 1.29 mg/dL (H)).  Liver Function Tests: Recent Labs  Lab 01/15/21 1600  AST 56*  ALT 21  ALKPHOS 59  BILITOT 0.5  PROT 6.3*  ALBUMIN 4.0    CBG: Recent Labs  Lab 01/15/21 1542  GLUCAP 123*     Recent Results (from the past 240 hour(s))  Resp Panel by RT-PCR (Flu A&B, Covid) Nasopharyngeal Swab     Status: None   Collection Time: 01/15/21  4:04 PM   Specimen: Nasopharyngeal Swab; Nasopharyngeal(NP) swabs in vial transport medium  Result Value Ref Range Status   SARS Coronavirus 2 by RT PCR NEGATIVE NEGATIVE Final    Comment: (NOTE) SARS-CoV-2 target nucleic acids are NOT DETECTED.  The SARS-CoV-2 RNA is generally detectable in upper respiratory specimens during the acute phase of infection. The lowest concentration of SARS-CoV-2 viral copies this assay can detect is 138 copies/mL. A negative result does not preclude SARS-Cov-2 infection and should not be used as the sole basis for treatment or other patient management decisions. A negative result may occur with  improper specimen collection/handling, submission of specimen other than nasopharyngeal swab, presence of viral mutation(s) within the areas targeted by this assay, and inadequate number of viral copies(<138 copies/mL). A negative result must be combined with clinical observations, patient history, and epidemiological information. The expected result is Negative.  Fact Sheet for  Patients:  EntrepreneurPulse.com.au  Fact Sheet for Healthcare Providers:  IncredibleEmployment.be  This test is no t yet approved or cleared by the [place] FDA and  has been authorized for detection and/or diagnosis of SARS-CoV-2 by FDA under an Emergency Use Authorization (EUA). This EUA will remain  in effect (meaning this test can be used) for the duration of the COVID-19 declaration under Section 564(b)(1) of the Act, 21 U.S.C.section 360bbb-3(b)(1), unless the authorization is terminated  or revoked sooner.       Influenza A by PCR NEGATIVE NEGATIVE Final   Influenza B by PCR NEGATIVE NEGATIVE Final    Comment: (NOTE) The Xpert Xpress SARS-CoV-2/FLU/RSV plus assay is intended as an aid in  the diagnosis of influenza from Nasopharyngeal swab specimens and should not be used as a sole basis for treatment. Nasal washings and aspirates are unacceptable for Xpert Xpress SARS-CoV-2/FLU/RSV testing.  Fact Sheet for Patients: EntrepreneurPulse.com.au  Fact Sheet for Healthcare Providers: IncredibleEmployment.be  This test is not yet approved or cleared by the [place] FDA and has been authorized for detection and/or diagnosis of SARS-CoV-2 by FDA under an Emergency Use Authorization (EUA). This EUA will remain in effect (meaning this test can be used) for the duration of the COVID-19 declaration under Section 564(b)(1) of the Act, 21 U.S.C. section 360bbb-3(b)(1), unless the authorization is terminated or revoked.  Performed at [facility]   Culture, blood (routine x 2)     Status: None (Preliminary result)   Collection Time: 01/15/21  7:28 PM   Specimen: Site Not Specified; Blood  Result Value Ref Range Status   Specimen Description SITE NOT SPECIFIED  Final   Special Requests   Final    BOTTLES DRAWN AEROBIC AND ANAEROBIC Blood Culture results may not be optimal due to an  inadequate volume of blood received in culture bottles   Culture   Final    NO GROWTH 2 DAYS Performed at [facility], [place] [address], [place], [place] [address]    Report Status PENDING  Incomplete  Urine culture     Status: Abnormal   Collection Time: 01/15/21 10:45 PM   Specimen: Urine, Random  Result Value Ref Range Status   Specimen Description URINE, RANDOM  Final   Special Requests   Final    NONE Performed at [facility], [place] [address] [place], [place] [address]    Culture (A)  Final    10,000 COLONIES/mL MULTIPLE SPECIES PRESENT, SUGGEST RECOLLECTION   Report Status 01/17/2021 FINAL  Final  MRSA PCR Screening     Status: None   Collection Time: 01/15/21 10:48 PM   Specimen: Nasopharyngeal  Result Value Ref Range Status   MRSA by PCR NEGATIVE NEGATIVE Final    Comment:        The GeneXpert MRSA Assay (FDA approved for NASAL specimens only), is one component of a comprehensive MRSA colonization surveillance program. It is not intended to diagnose MRSA infection nor to guide or monitor treatment for MRSA infections. Performed at [facility], [place] [address] [place], [place] [address]   Culture, blood (routine x 2)     Status: None (Preliminary result)   Collection Time: 01/15/21 11:03 PM   Specimen: BLOOD  Result Value Ref Range Status   Specimen Description BLOOD SITE NOT SPECIFIED  Final   Special Requests   Final    BOTTLES DRAWN AEROBIC AND ANAEROBIC Blood Culture adequate volume   Culture   Final    NO GROWTH 1 DAY Performed at [facility], [place] [address], [place], [place] [address]    Report Status PENDING  Incomplete         Radiology Studies: CT HEAD WO CONTRAST  Result Date: 01/16/2021 CLINICAL DATA:  Follow-up intracranial hemorrhage EXAM: CT HEAD WITHOUT CONTRAST TECHNIQUE: Contiguous axial images were obtained from the base of the skull through the vertex without intravenous contrast. COMPARISON:  01/15/2021  FINDINGS: Brain: No evidence of additional or new bleeding. No visible hemorrhage in the posterior fossa. Subarachnoid blood on both sides, largest amount in the left sylvian fissure sulci. Numerous intraparenchymal hematomas in both frontal lobes, the largest in the right measuring up  to 3.3 cm in diameter as seen yesterday. Surrounding associated edema is very similar. Slightly more blood layering dependently in the occipital horns of both lateral ventricles, expected. Ventricular size has not changed. Mild chronic small-vessel ischemic changes of the hemispheric white matter as a background pattern. Vascular: There is atherosclerotic calcification of the major vessels at the base of the brain. Skull: No fracture. Sinuses/Orbits: Clear/normal Other: None IMPRESSION: 1. No evidence of additional or new bleeding. 2. Numerous intraparenchymal hematomas in both frontal lobes, the largest in the right frontal lobe measuring up to 3.3 cm in diameter. Surrounding associated edema is very similar. 3. Subarachnoid blood on both sides, largest amount in the left Sylvian fissure sulci. Slightly more blood layering dependently in the occipital horns of both lateral ventricles, expected. Ventricular size has not changed. Electronically Signed   By: Nelson Chimes M.D.   On: 01/16/2021 13:33        Scheduled Meds: . amLODipine  10 mg Oral Daily  . Chlorhexidine Gluconate Cloth  6 each Topical Daily  . erythromycin   Both Eyes QID  . hydrALAZINE  10 mg Oral Q8H  . levETIRAcetam  500 mg Oral BID  . melatonin  3 mg Oral QHS  . mirtazapine  7.5 mg Oral QHS  . pantoprazole  40 mg Oral QHS  . tuberculin  5 Units Intradermal Once   Continuous Infusions: . sodium chloride Stopped (01/16/21 1553)  . lactated ringers 100 mL/hr at 01/18/21 0444     LOS: 3 days       Hosie Poisson, MD [facility]   To contact the attending provider between 7A-7P or the covering provider during after hours 7P-7A, please  log into the web site [email or web address] and access using universal [place] password for that web site. If you do not have the password, please call the hospital operator.  01/18/2021, 10:41 AM

## 2021-01-18 NOTE — Progress Notes (Signed)
TB bleb placed on left proximal forearm, circled with marker. Spoke with Lennette Bihari, member of pharmacy, regarding VIS publish number. No number was available. He consulted with other pharmacy members who stated this information is typically for vaccines and not TB skin test administration.

## 2021-01-18 NOTE — Progress Notes (Signed)
Subjective: Patient has no complaints. He is doing well. No acute events overnight.   Objective: Vital signs in last 24 hours: Temp:  [97.8 F (36.6 C)-99.5 F (37.5 C)] 97.8 F (36.6 C) (03/27 0804) Pulse Rate:  [51-90] 59 (03/27 0804) Resp:  [11-23] 18 (03/27 0804) BP: (114-164)/(57-76) 117/57 (03/27 0804) SpO2:  [95 %-100 %] 95 % (03/27 0804) Weight:  [75 kg] 75 kg (03/27 0446)  Intake/Output from previous day: 03/26 0701 - 03/27 0700 In: 439.9 [P.O.:240; I.V.:199.9] Out: 1700 [Urine:1700] Intake/Output this shift: No intake/output data recorded.  Physical Exam: Patient is A/O X 4, conversant, and in good spirits. Following commands. Speech is fluent and appropriate. MAEW with good strength that is symmetric bilaterally. CN's grossly intract. PERRLA  Lab Results: Recent Labs    01/17/21 0157 01/18/21 0130  WBC 6.3 6.4  HGB 12.6* 12.8*  HCT 35.6* 37.8*  PLT 176 176   BMET Recent Labs    01/17/21 0157 01/18/21 0130  NA 140 140  K 3.8 4.8  CL 111 110  CO2 20* 25  GLUCOSE 90 105*  BUN 14 13  CREATININE 1.20 1.29*  CALCIUM 8.2* 8.5*    Studies/Results: CT HEAD WO CONTRAST  Result Date: 01/16/2021 CLINICAL DATA:  Follow-up intracranial hemorrhage EXAM: CT HEAD WITHOUT CONTRAST TECHNIQUE: Contiguous axial images were obtained from the base of the skull through the vertex without intravenous contrast. COMPARISON:  01/15/2021 FINDINGS: Brain: No evidence of additional or new bleeding. No visible hemorrhage in the posterior fossa. Subarachnoid blood on both sides, largest amount in the left sylvian fissure sulci. Numerous intraparenchymal hematomas in both frontal lobes, the largest in the right measuring up to 3.3 cm in diameter as seen yesterday. Surrounding associated edema is very similar. Slightly more blood layering dependently in the occipital horns of both lateral ventricles, expected. Ventricular size has not changed. Mild chronic small-vessel ischemic changes of  the hemispheric white matter as a background pattern. Vascular: There is atherosclerotic calcification of the major vessels at the base of the brain. Skull: No fracture. Sinuses/Orbits: Clear/normal Other: None IMPRESSION: 1. No evidence of additional or new bleeding. 2. Numerous intraparenchymal hematomas in both frontal lobes, the largest in the right frontal lobe measuring up to 3.3 cm in diameter. Surrounding associated edema is very similar. 3. Subarachnoid blood on both sides, largest amount in the left Sylvian fissure sulci. Slightly more blood layering dependently in the occipital horns of both lateral ventricles, expected. Ventricular size has not changed. Electronically Signed   By: Nelson Chimes M.D.   On: 01/16/2021 13:33    Assessment/Plan: Patient is doing well from a neurosurgical perspective. He was transferred out of the ICU yesterday. Following commands. Ambulating in the hallway well with nursing staff. Patient will need SNF placement for later convalescence.      LOS: 3 days     Marvis Moeller, DNP, NP-C 01/18/2021, 8:43 AM

## 2021-01-19 ENCOUNTER — Encounter: Payer: Self-pay | Admitting: Internal Medicine

## 2021-01-19 LAB — CBC
HCT: 36.1 % — ABNORMAL LOW (ref 39.0–52.0)
Hemoglobin: 12.6 g/dL — ABNORMAL LOW (ref 13.0–17.0)
MCH: 33.4 pg (ref 26.0–34.0)
MCHC: 34.9 g/dL (ref 30.0–36.0)
MCV: 95.8 fL (ref 80.0–100.0)
Platelets: 177 10*3/uL (ref 150–400)
RBC: 3.77 MIL/uL — ABNORMAL LOW (ref 4.22–5.81)
RDW: 13.7 % (ref 11.5–15.5)
WBC: 5.7 10*3/uL (ref 4.0–10.5)
nRBC: 0 % (ref 0.0–0.2)

## 2021-01-19 LAB — BASIC METABOLIC PANEL
Anion gap: 6 (ref 5–15)
BUN: 9 mg/dL (ref 8–23)
CO2: 25 mmol/L (ref 22–32)
Calcium: 8.5 mg/dL — ABNORMAL LOW (ref 8.9–10.3)
Chloride: 107 mmol/L (ref 98–111)
Creatinine, Ser: 1.24 mg/dL (ref 0.61–1.24)
GFR, Estimated: 57 mL/min — ABNORMAL LOW (ref >60)
Glucose, Bld: 119 mg/dL — ABNORMAL HIGH (ref 70–99)
Potassium: 3.7 mmol/L (ref 3.5–5.1)
Sodium: 138 mmol/L (ref 135–145)

## 2021-01-19 LAB — MAGNESIUM: Magnesium: 1.8 mg/dL (ref 1.7–2.4)

## 2021-01-19 LAB — PHOSPHORUS: Phosphorus: 3.1 mg/dL (ref 2.5–4.6)

## 2021-01-19 MED ORDER — LOPERAMIDE HCL 2 MG PO CAPS: 2.0000 mg | ORAL_CAPSULE | ORAL | 0 refills | Status: DC | PRN

## 2021-01-19 MED ORDER — ERYTHROMYCIN 5 MG/GM OP OINT: TOPICAL_OINTMENT | Freq: Four times a day (QID) | OPHTHALMIC | 0 refills | Status: DC

## 2021-01-19 MED ORDER — POLYETHYLENE GLYCOL 3350 17 G PO PACK: 17.0000 g | PACK | Freq: Every day | ORAL | 0 refills | Status: DC | PRN

## 2021-01-19 MED ORDER — LEVETIRACETAM 500 MG PO TABS: 500.0000 mg | ORAL_TABLET | Freq: Two times a day (BID) | ORAL | 1 refills | Status: DC

## 2021-01-19 MED ORDER — AMLODIPINE BESYLATE 10 MG PO TABS: 10.0000 mg | ORAL_TABLET | Freq: Every day | ORAL | 1 refills | Status: DC

## 2021-01-19 MED ORDER — TAMSULOSIN HCL 0.4 MG PO CAPS: 0.4000 mg | ORAL_CAPSULE | Freq: Every day | ORAL | 1 refills | Status: DC

## 2021-01-19 NOTE — Care Management Important Message (Signed)
Important Message  Patient Details  Name: John [name] MRN: [id number] Date of Birth: 1935/03/06   Medicare Important Message Given:  Yes     Christle Nolting Montine Circle 01/19/2021, 4:39 PM

## 2021-01-19 NOTE — TOC Progression Note (Signed)
Transition of Care [facility]) - Progression Note    Patient Details  Name: John [name] MRN: [id number] Date of Birth: 1935/03/14  [facility] CM/SW Contact  Pollie Friar, RN Phone Number: 01/19/2021, 1:29 PM  Clinical Narrative:    Patient to d/c home to daughters house today and then transition to ALF tomorrow per daughter. She has been in contact with [facility] and they have offered the patient a bed. CM has faxed them the requested information: [phone]. Pt did receive TB test yesterday to [facility] and Debbie at [facility] states they can read the results at the facility. CM sent them information on placement of the test.  Walker for home to be delivered to the room per [facility].  TOC following.   Expected Discharge Plan: [place] Barriers to Discharge: Continued Medical Work up  Expected Discharge Plan and Services Expected Discharge Plan: [place]   Discharge Planning Services: CM Consult Post Acute Care Choice: [place] arrangements for the past 2 months: [place]: PT,OT,Social Work [place]: [place] Date [place]: 01/18/21   Representative spoke with at [place]: [place] Interventions    Readmission Risk Interventions No flowsheet data found.

## 2021-01-19 NOTE — Progress Notes (Signed)
Physical Therapy Treatment Patient Details Name: John [name] MRN: [id number] DOB: May 21, 1935 Today's Date: 01/19/2021    History of Present Illness Pt is an 86 y/o male admitted to [facility] on 3/24 with fall on 3/19 and again on 3/23. Pt's daughter found him down on 3/24 and pt states he had been crawling since fall. CTH shows bilateral frontal intraparenchymal hemorrhage with a subarachnoid component and IVH, no midline shift. PMH includes CAD, s/p CABG, aortic insufficiency, hypotension on florinef, DM2 (diet managed), mild dementia, Parkinson's Disease, anxiety/depression.    PT Comments    Pt progressing towards physical therapy goals. Was able to perform transfers and ambulation with gross min guard assist for balance support and safetty, with RW for support. Family present and questions answered within scope of practice. Will continue to follow and progress as able per POC.     Follow Up Recommendations  Home health PT;Supervision for mobility/OOB     Equipment Recommendations  None recommended by PT    Recommendations for Other Services       Precautions / Restrictions Precautions Precautions: Fall Restrictions Weight Bearing Restrictions: No    Mobility  Bed Mobility Overal bed mobility: Needs Assistance Bed Mobility: Supine to Sit     Supine to sit: Supervision     General bed mobility comments: HOB elevated. Pt was able to transition to EOB without assistance however required cues to keep from standing until therapist had RW and gait belt ready.    Transfers Overall transfer level: Needs assistance Equipment used: Rolling walker (2 wheeled) Transfers: Sit to/from Stand Sit to Stand: Min guard         General transfer comment: Hands on guarding for safety as pt powered up to full stand. Pt with somewhat uncontrolled descent to chair at end of session however no assist was required.  Ambulation/Gait Ambulation/Gait assistance: Min guard Gait Distance  (Feet): 200 Feet Assistive device: Rolling walker (2 wheeled) Gait Pattern/deviations: Step-through pattern;Decreased stride length;Drifts right/left Gait velocity: Decreased Gait velocity interpretation: <1.31 ft/sec, indicative of household ambulator General Gait Details: Slow but generally steady with RW for support and without overt LOB. Pt required cues to continue with ambulation as he continuously tries to turn around and return to the room. When asked, pt reports he is not fatigued and can continue on further.   Stairs             Wheelchair Mobility    Modified Rankin (Stroke Patients Only)       Balance Overall balance assessment: Needs assistance Sitting-balance support: Feet supported;No upper extremity supported Sitting balance-Leahy Scale: Good     Standing balance support: Bilateral upper extremity supported;No upper extremity supported;During functional activity Standing balance-Leahy Scale: Poor Standing balance comment: Reliant on RW during gait training.                            Cognition Arousal/Alertness: Awake/alert Behavior During Therapy: WFL for tasks assessed/performed;Flat affect Overall Cognitive Status: History of cognitive impairments - at baseline                                 General Comments: baseline mild dementia, requires cueing for safety, redirection for tasks due to perseveration on prior task completed      Exercises      General Comments        Pertinent Vitals/Pain Pain Assessment: No/denies  pain    Home Living                      Prior Function            PT Goals (current goals can now be found in the care plan section) Acute Rehab PT Goals Patient Stated Goal: be independent PT Goal Formulation: With patient Time For Goal Achievement: 01/30/21 Potential to Achieve Goals: Good Progress towards PT goals: Progressing toward goals    Frequency    Min 4X/week       PT Plan Current plan remains appropriate    Co-evaluation              AM-PAC PT "6 Clicks" Mobility   Outcome Measure  Help needed turning from your back to your side while in a flat bed without using bedrails?: A Little Help needed moving from lying on your back to sitting on the side of a flat bed without using bedrails?: A Little Help needed moving to and from a bed to a chair (including a wheelchair)?: A Little Help needed standing up from a chair using your arms (e.g., wheelchair or bedside chair)?: A Little Help needed to walk in hospital room?: A Little Help needed climbing 3-5 steps with a railing? : A Little 6 Click Score: 18    End of Session Equipment Utilized During Treatment: Gait belt Activity Tolerance: Patient tolerated treatment well Patient left: in bed;with call bell/phone within reach;with bed alarm set;with family/visitor present Nurse Communication: Mobility status PT Visit Diagnosis: Other abnormalities of gait and mobility (R26.89);Difficulty in walking, not elsewhere classified (R26.2)     Time: 8295-6213 PT Time Calculation (min) (ACUTE ONLY): 15 min  Charges:  $Gait Training: 8-22 mins                     [name], PT, DPT Acute Rehabilitation Services Pager: [phone] Office: [phone]    [name] 01/19/2021, 2:06 PM

## 2021-01-19 NOTE — NC FL2 (Addendum)
[place] LEVEL OF CARE SCREENING TOOL     IDENTIFICATION  Patient Name: John [name] Birthdate: 1935-09-23 Sex: male Admission Date (Current Location): 01/15/2021  [facility] and [place] Number:  Herbalist and Address:  [facility] [place], [place] [address] [place], [place] [address]      Provider Number: [id number]  Attending Physician Name and Address:  Hosie Poisson, MD  Relative Name and Phone Number:       Current Level of Care: Hospital Recommended Level of Care: [place] Prior Approval Number:    Date Approved/Denied:   PASRR Number:    Discharge Plan: Home (Then to ALF)    Current Diagnoses: Patient Active Problem List   Diagnosis Date Noted  . Intracranial bleed ([place]) 01/15/2021  . SAH (subarachnoid hemorrhage) ([place])   . Acute conjunctivitis of both eyes   . Non-traumatic rhabdomyolysis   . Primary osteoarthritis of left knee 09/26/2019  . S/P left knee arthroscopy 09/26/2019  . 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 Adrenal insufficiency ([place]) 09/25/2019  . Acute medial meniscus tear of left knee 08/09/2019  . Insomnia 02/19/2018  . Diabetic peripheral neuropathy ([place]) 11/21/2017  . BMI 21.0-21.9, adult 09/04/2015  . Esophageal reflux 07/09/2015  . Autonomic postural hypotension 02/06/2015  . CKD stage G3a/A1, GFR 45-59 and albumin creatinine ratio <30 mg/g (HCC) 03/20/2014  . Medication management 02/06/2014  . Vitamin D deficiency 02/06/2014  . Gastric AV malformation 10/30/2012  . History of colonic polyps 09/14/2011  . Mixed hyperlipidemia 03/12/2009  . Essential hypertension 03/12/2009  . 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 History of TIA (transient ischemic attack) 03/12/2009  . Asthma 03/12/2009  . COPD (chronic obstructive pulmonary disease) ([place]) 03/12/2009  . GERD 03/12/2009    Orientation RESPIRATION BLADDER  Height & Weight     Self,Place,Situation  Normal Incontinent Weight: 75 kg Height:  6' (182.9 cm)  BEHAVIORAL SYMPTOMS/MOOD NEUROLOGICAL BOWEL NUTRITION STATUS      Continent Diet (Heart Healthy)  AMBULATORY STATUS COMMUNICATION OF NEEDS Skin   Independent Verbally Normal                       Personal Care Assistance Level of Assistance  Bathing,Feeding,Dressing Bathing Assistance: Limited assistance Feeding assistance: Independent Dressing Assistance: Limited assistance (supervision)     Functional Limitations Info  Sight,Hearing,Speech Sight Info: Impaired Hearing Info: Impaired Speech Info: Adequate    SPECIAL CARE FACTORS FREQUENCY                       Contractures Contractures Info: Not present    Additional Factors Info  Code Status,Allergies,Psychotropic Code Status Info: Full Allergies Info: Gabapentin/ Prozac/ Soma/ Beta Blockers Psychotropic Info: Melatonin 3 mg at bedtime/ Remeron 7.5 mg at bedtime/ Keppra 500 mg BID         Current Medications (01/19/2021):     Discharge Medications: TAKE these medications       amLODipine 10 MG tablet Commonly known as: NORVASC Take 1 tablet (10 mg total) by mouth daily. Start taking on: January 20, 2021   erythromycin ophthalmic ointment Place into both eyes 4 (four) times daily.   escitalopram 10 MG tablet Commonly known as: LEXAPRO Take  1 tablet  Daily  for Mood & Anxiety What changed:   how much to take  how to take this  when to take this  additional instructions   finasteride 5 MG tablet Commonly known as: PROSCAR Take      1 tablet       Daily       for Prostate What changed:   how much to take  how to take this  when to take this  additional instructions   fludrocortisone 0.1 MG tablet Commonly known as: FLORINEF Take 1 tablet 2 x /day for Low BP What changed:   how much to take  how to take this  when to take this  additional instructions   freestyle  lancets 1 each by Other route as needed for other. Use as instructed   FREESTYLE LITE test strip Generic drug: glucose blood TEST BLOOD SUGAR ONCE DAILY   levETIRAcetam 500 MG tablet Commonly known as: KEPPRA Take 1 tablet (500 mg total) by mouth 2 (two) times daily.   loperamide 2 MG capsule Commonly known as: IMODIUM Take 1 capsule (2 mg total) by mouth as needed for diarrhea or loose stools.   Melatonin 5 MG Caps Take 10 mg by mouth at bedtime.   mirtazapine 30 MG disintegrating tablet Commonly known as: REMERON SOL-TAB Take  1 tablet  at Bedtime  for Sleep & Appetite What changed:   how much to take  how to take this  when to take this  additional instructions   nitroGLYCERIN 0.4 MG SL tablet Commonly known as: NITROSTAT Sig: 1 tablet under tongue every 3 to 5 minutes as needed for Angina - Please Dispense # 2 bottles of # 25 tabs What changed:   how much to take  how to take this  when to take this  reasons to take this  additional instructions   pantoprazole 40 MG tablet Commonly known as: PROTONIX TAKE ONE TABLET BY MOUTH ONCE DAILY   polyethylene glycol 17 g packet Commonly known as: MIRALAX / GLYCOLAX Take 17 g by mouth daily as needed for moderate constipation.   potassium citrate 10 MEQ (1080 MG) SR tablet Commonly known as: UROCIT-K Take 1 tablet 2 x /day to Prevent Kidney Stones What changed:   how much to take  how to take this  when to take this  additional instructions   QUEtiapine 100 MG tablet Commonly known as: SEROQUEL TAKE 1 TO 2 TABLETS BY MOUTH AT BEDTIME AS NEEDED FOR SLEEP What changed:   how much to take  how to take this  when to take this  reasons to take this  additional instructions   simvastatin 80 MG tablet Commonly known as: ZOCOR Take 1 tablet at Bedtime for Cholesterol What changed:   how much to take  how to take this  when to take this  additional instructions   tamsulosin  0.4 MG Caps capsule Commonly known as: FLOMAX Take 1 capsule (0.4 mg total) by mouth daily after supper.   vitamin C 500 MG tablet Commonly known as: ASCORBIC ACID Take 500 mg by mouth daily.   Vitamin D3 50 MCG (2000 UT) capsule Take 8,000 Units by mouth daily.   zinc gluconate 50 MG tablet Take 50 mg by mouth every other day.      Relevant Imaging Results:  Relevant Lab Results:   Additional Information SS#: [id number]  Pollie Friar, RN

## 2021-01-19 NOTE — Discharge Summary (Signed)
Physician Discharge Summary  RHYS LICHTY ZOX:[id number] DOB: 1935-07-03 DOA: 01/15/2021  PCP: Unk Pinto, MD  Admit date: 01/15/2021 Discharge date: 01/19/2021  Admitted From: Home. Disposition:  Home.   Recommendations for Outpatient Follow-up:  1. Follow up with PCP in 1-2 weeks 2. Please obtain BMP/CBC in one week Please follow up with cardiology in one week.  Please follow up with neuro surgery in 2 weeks.   Discharge Condition:guarded.  CODE STATUS:full code.  Diet recommendation: Heart Healthy  Brief/Interim Summary: 86 year old gentleman prior history of coronary artery disease s/p CABG, diabetes mellitus, dementia had a mechanical fall and hit his head.  Presents to ED with confusion and altered mental status.  CT of the head was done and showed multiple foci of hemorrhage, transferred to [facility] for further evaluation.  At [facility] he was evaluated by neurosurgery for intracranial hemorrhage and subarachnoid hemorrhage, no surgical interventions were recommended.  He was initially admitted to [facility] and transferred to [facility] on 01/18/2021.  Discharge Diagnoses:  Active Problems:   Intracranial bleed (HCC)  Traumatic intracranial hemorrhage secondary to a mechanical fall CT of the head showed subarachnoid hemorrhage. Neurosurgery on board recommended no surgical intervention at this time. Therapy evaluations are health PT Family would like to pursue ALF at this time. Skin tuberculin test ordered.     Mild acute on chronic kidney disease probably from rhabdomyolysis and dehydration Improving with IV fluids.   Stable coronary artery disease status post CABG Patient at home on Plavix which is being held for head bleed. Recommend outpatient follow up with neuro surgery to see when it can be restarted. Neuro surgery recommended to stop the plavix indefinitely if okay with cardiology as the patient has h/o frequent falls.      Hyperlipidemia Continue with  statin.   ?  Parkinson's disease with dysautonomia Continue with Florinef on discharge.   Mild anemia of chronic disease Hemoglobin stable around 12.   Discharge Instructions  Discharge Instructions    Diet - low sodium heart healthy   Complete by: As directed    Discharge instructions   Complete by: As directed    Please follow up with Neurosurgery as recommended and PCP in one week.     Allergies as of 01/19/2021      Reactions   Gabapentin Other (See Comments)   Severe confusion   Prozac [fluoxetine Hcl] Other (See Comments)   Patient state that it does not agree with him   Soma [carisoprodol] Other (See Comments)   Patient stated that it does not agree with him   Beta Adrenergic Blockers Other (See Comments)   REACTION: Bradycardia      Medication List    STOP taking these medications   ALPRAZolam 1 MG tablet Commonly known as: XANAX   clopidogrel 75 MG tablet Commonly known as: PLAVIX   ondansetron 8 MG tablet Commonly known as: Zofran   silodosin 8 MG Caps capsule Commonly known as: RAPAFLO     TAKE these medications   amLODipine 10 MG tablet Commonly known as: NORVASC Take 1 tablet (10 mg total) by mouth daily. Start taking on: January 20, 2021   erythromycin ophthalmic ointment Place into both eyes 4 (four) times daily.   escitalopram 10 MG tablet Commonly known as: LEXAPRO Take  1 tablet  Daily  for Mood & Anxiety What changed:   how much to take  how to take this  when to take this  additional instructions   finasteride 5 MG  tablet Commonly known as: PROSCAR Take      1 tablet       Daily       for Prostate What changed:   how much to take  how to take this  when to take this  additional instructions   fludrocortisone 0.1 MG tablet Commonly known as: FLORINEF Take 1 tablet 2 x /day for Low BP What changed:   how much to take  how to take this  when to take this  additional instructions   freestyle lancets 1  each by Other route as needed for other. Use as instructed   FREESTYLE LITE test strip Generic drug: glucose blood TEST BLOOD SUGAR ONCE DAILY   levETIRAcetam 500 MG tablet Commonly known as: KEPPRA Take 1 tablet (500 mg total) by mouth 2 (two) times daily.   loperamide 2 MG capsule Commonly known as: IMODIUM Take 1 capsule (2 mg total) by mouth as needed for diarrhea or loose stools.   Melatonin 5 MG Caps Take 10 mg by mouth at bedtime.   mirtazapine 30 MG disintegrating tablet Commonly known as: REMERON SOL-TAB Take  1 tablet  at Bedtime  for Sleep & Appetite What changed:   how much to take  how to take this  when to take this  additional instructions   nitroGLYCERIN 0.4 MG SL tablet Commonly known as: NITROSTAT Sig: 1 tablet under tongue every 3 to 5 minutes as needed for Angina - Please Dispense # 2 bottles of # 25 tabs What changed:   how much to take  how to take this  when to take this  reasons to take this  additional instructions   pantoprazole 40 MG tablet Commonly known as: PROTONIX TAKE ONE TABLET BY MOUTH ONCE DAILY   polyethylene glycol 17 g packet Commonly known as: MIRALAX / GLYCOLAX Take 17 g by mouth daily as needed for moderate constipation.   potassium citrate 10 MEQ (1080 MG) SR tablet Commonly known as: UROCIT-K Take 1 tablet 2 x /day to Prevent Kidney Stones What changed:   how much to take  how to take this  when to take this  additional instructions   QUEtiapine 100 MG tablet Commonly known as: SEROQUEL TAKE 1 TO 2 TABLETS BY MOUTH AT BEDTIME AS NEEDED FOR SLEEP What changed:   how much to take  how to take this  when to take this  reasons to take this  additional instructions   simvastatin 80 MG tablet Commonly known as: ZOCOR Take 1 tablet at Bedtime for Cholesterol What changed:   how much to take  how to take this  when to take this  additional instructions   tamsulosin 0.4 MG Caps  capsule Commonly known as: FLOMAX Take 1 capsule (0.4 mg total) by mouth daily after supper.   vitamin C 500 MG tablet Commonly known as: ASCORBIC ACID Take 500 mg by mouth daily.   Vitamin D3 50 MCG (2000 UT) capsule Take 8,000 Units by mouth daily.   zinc gluconate 50 MG tablet Take 50 mg by mouth every other day.            Durable Medical Equipment  (From admission, onward)         Start     Ordered   01/19/21 1324  For home use only DME Walker rolling  Once       Question Answer Comment  Walker: With 5 Inch Wheels   Patient needs a walker to treat  with the following condition Intracranial hemorrhage ([place])      01/19/21 1323          Follow-up Information    Unk Pinto, MD. Schedule an appointment as soon as possible for a visit in 1 week(s).   Specialty: Internal Medicine Contact information: [address] [place] [address] [phone]        Burnell Blanks, MD .   Specialty: Cardiology Contact information: [place] [place] [address] [phone]        Ashok Pall, MD. Schedule an appointment as soon as possible for a visit in 2 week(s).   Specialty: Neurosurgery Contact information: [address] [place] [address] [phone]              Allergies  Allergen Reactions  . Gabapentin Other (See Comments)    Severe confusion  . Prozac [Fluoxetine Hcl] Other (See Comments)    Patient state that it does not agree with him  . Soma [Carisoprodol] Other (See Comments)    Patient stated that it does not agree with him  . Beta Adrenergic Blockers Other (See Comments)    REACTION: Bradycardia    Consultations:  Neuro surgery.    Procedures/Studies: DG Chest 1 View  Result Date: 01/15/2021 CLINICAL DATA:  Cough, fell backwards striking head, bruising and bleeding at posterior RIGHT elbow EXAM: CHEST  1 VIEW COMPARISON:  12/20/2011 FINDINGS: Normal  heart size post CABG. Moderate-sized hiatal hernia. Atherosclerotic calcification aorta. Lungs clear. No pulmonary infiltrate, pleural effusion, or pneumothorax. Osseous demineralization with evidence of prior cervical spine fusion. IMPRESSION: Hiatal hernia. No acute abnormalities. Aortic Atherosclerosis (ICD10-I70.0). Electronically Signed   By: Lavonia Dana M.D.   On: 01/15/2021 16:55   DG Pelvis 1-2 Views  Result Date: 01/15/2021 CLINICAL DATA:  [place] backwards striking head EXAM: PELVIS - 1-2 VIEW COMPARISON:  07/20/2019 FINDINGS: Osseous demineralization. Hip and SI joint spaces preserved. No acute fracture, dislocation, or bone destruction. IMPRESSION: No acute abnormalities. Electronically Signed   By: Lavonia Dana M.D.   On: 01/15/2021 16:56   CT HEAD WO CONTRAST  Result Date: 01/16/2021 CLINICAL DATA:  Follow-up intracranial hemorrhage EXAM: CT HEAD WITHOUT CONTRAST TECHNIQUE: Contiguous axial images were obtained from the base of the skull through the vertex without intravenous contrast. COMPARISON:  01/15/2021 FINDINGS: Brain: No evidence of additional or new bleeding. No visible hemorrhage in the posterior fossa. Subarachnoid blood on both sides, largest amount in the left sylvian fissure sulci. Numerous intraparenchymal hematomas in both frontal lobes, the largest in the right measuring up to 3.3 cm in diameter as seen yesterday. Surrounding associated edema is very similar. Slightly more blood layering dependently in the occipital horns of both lateral ventricles, expected. Ventricular size has not changed. Mild chronic small-vessel ischemic changes of the hemispheric white matter as a background pattern. Vascular: There is atherosclerotic calcification of the major vessels at the base of the brain. Skull: No fracture. Sinuses/Orbits: Clear/normal Other: None IMPRESSION: 1. No evidence of additional or new bleeding. 2. Numerous intraparenchymal hematomas in both frontal lobes, the largest in the  right frontal lobe measuring up to 3.3 cm in diameter. Surrounding associated edema is very similar. 3. Subarachnoid blood on both sides, largest amount in the left Sylvian fissure sulci. Slightly more blood layering dependently in the occipital horns of both lateral ventricles, expected. Ventricular size has not changed. Electronically Signed   By: Nelson Chimes M.D.   On: 01/16/2021  13:33   CT Head Wo Contrast  Result Date: 01/15/2021 CLINICAL DATA:  Unwitnessed fall. EXAM: CT HEAD WITHOUT CONTRAST CT CERVICAL SPINE WITHOUT CONTRAST TECHNIQUE: Multidetector CT imaging of the head and cervical spine was performed following the standard protocol without intravenous contrast. Multiplanar CT image reconstructions of the cervical spine were also generated. COMPARISON:  September 10, 2015. FINDINGS: CT HEAD FINDINGS Brain: Moderate amount of subarachnoid hemorrhage is noted in the left sylvian fissure. 3.5 x 2.4 cm right frontal intraparenchymal hemorrhage is noted. Smaller left frontal intraparenchymal hemorrhage is noted. Small amount of subarachnoid hemorrhage is also noted in the right frontal region. Mild chronic ischemic white matter disease is noted. Ventricular size is within normal limits. Small amount of intraventricular hemorrhage is noted in the left posterior horn. No midline shift is noted. No mass lesion is noted. Vascular: No hyperdense vessel or unexpected calcification. Skull: Normal. Negative for fracture or focal lesion. Sinuses/Orbits: No acute finding. Other: None. CT CERVICAL SPINE FINDINGS Alignment: Normal. Skull base and vertebrae: No acute fracture. No primary bone lesion or focal pathologic process. Soft tissues and spinal canal: No prevertebral fluid or swelling. No visible canal hematoma. Disc levels:  Status post surgical anterior fusion of C5-6 and C6-7. Upper chest: Negative. Other: Severe degenerative changes seen involving the left-sided posterior facet joint of C3-4. IMPRESSION: 1.  Bilateral frontal intraparenchymal hemorrhages are noted, with the largest on the right measuring 3.5 x 2.4 cm. Moderate amount of subarachnoid hemorrhage is noted in left sylvian fissure. Small amount of subarachnoid hemorrhage is also noted in the right frontal region. Small amount of intraventricular hemorrhage is noted in the left posterior horn. No midline shift is noted. Critical Value/emergent results were called by telephone at the time of interpretation on 01/15/2021 at 4:49 pm to provider [facility] , who verbally acknowledged these results. 2. Postsurgical and degenerative changes are noted in the cervical spine. No fracture or spondylolisthesis is noted. Electronically Signed   By: Marijo Conception M.D.   On: 01/15/2021 16:49   CT Cervical Spine Wo Contrast  Result Date: 01/15/2021 CLINICAL DATA:  Unwitnessed fall. EXAM: CT HEAD WITHOUT CONTRAST CT CERVICAL SPINE WITHOUT CONTRAST TECHNIQUE: Multidetector CT imaging of the head and cervical spine was performed following the standard protocol without intravenous contrast. Multiplanar CT image reconstructions of the cervical spine were also generated. COMPARISON:  September 10, 2015. FINDINGS: CT HEAD FINDINGS Brain: Moderate amount of subarachnoid hemorrhage is noted in the left sylvian fissure. 3.5 x 2.4 cm right frontal intraparenchymal hemorrhage is noted. Smaller left frontal intraparenchymal hemorrhage is noted. Small amount of subarachnoid hemorrhage is also noted in the right frontal region. Mild chronic ischemic white matter disease is noted. Ventricular size is within normal limits. Small amount of intraventricular hemorrhage is noted in the left posterior horn. No midline shift is noted. No mass lesion is noted. Vascular: No hyperdense vessel or unexpected calcification. Skull: Normal. Negative for fracture or focal lesion. Sinuses/Orbits: No acute finding. Other: None. CT CERVICAL SPINE FINDINGS Alignment: Normal. Skull base and vertebrae:  No acute fracture. No primary bone lesion or focal pathologic process. Soft tissues and spinal canal: No prevertebral fluid or swelling. No visible canal hematoma. Disc levels:  Status post surgical anterior fusion of C5-6 and C6-7. Upper chest: Negative. Other: Severe degenerative changes seen involving the left-sided posterior facet joint of C3-4. IMPRESSION: 1. Bilateral frontal intraparenchymal hemorrhages are noted, with the largest on the right measuring 3.5 x 2.4 cm. Moderate amount of subarachnoid  hemorrhage is noted in left sylvian fissure. Small amount of subarachnoid hemorrhage is also noted in the right frontal region. Small amount of intraventricular hemorrhage is noted in the left posterior horn. No midline shift is noted. Critical Value/emergent results were called by telephone at the time of interpretation on 01/15/2021 at 4:49 pm to provider [facility] , who verbally acknowledged these results. 2. Postsurgical and degenerative changes are noted in the cervical spine. No fracture or spondylolisthesis is noted. Electronically Signed   By: Marijo Conception M.D.   On: 01/15/2021 16:49       Subjective: No chest pain, sob, headache, or dizziness.   Discharge Exam: Vitals:   01/19/21 0815 01/19/21 1207  BP: (!) 105/53 (!) 124/58  Pulse: (!) 101 90  Resp: 18 18  Temp: 98.5 F (36.9 C) 97.6 F (36.4 C)  SpO2: 99% 99%   Vitals:   01/19/21 0533 01/19/21 0535 01/19/21 0815 01/19/21 1207  BP: (!) 120/48 (!) 121/57 (!) 105/53 (!) 124/58  Pulse: 80 79 (!) 101 90  Resp:   18 18  Temp:   98.5 F (36.9 C) 97.6 F (36.4 C)  TempSrc:   Oral Oral  SpO2:   99% 99%  Weight:      Height:        General: Pt is alert, awake, not in acute distress Cardiovascular: RRR, S1/S2 +, no rubs, no gallops Respiratory: CTA bilaterally, no wheezing, no rhonchi Abdominal: Soft, NT, ND, bowel sounds + Extremities: no edema, no cyanosis    The results of significant diagnostics from this  hospitalization (including imaging, microbiology, ancillary and laboratory) are listed below for reference.     Microbiology: Recent Results (from the past 240 hour(s))  Resp Panel by RT-PCR (Flu A&B, Covid) Nasopharyngeal Swab     Status: None   Collection Time: 01/15/21  4:04 PM   Specimen: Nasopharyngeal Swab; Nasopharyngeal(NP) swabs in vial transport medium  Result Value Ref Range Status   SARS Coronavirus 2 by RT PCR NEGATIVE NEGATIVE Final    Comment: (NOTE) SARS-CoV-2 target nucleic acids are NOT DETECTED.  The SARS-CoV-2 RNA is generally detectable in upper respiratory specimens during the acute phase of infection. The lowest concentration of SARS-CoV-2 viral copies this assay can detect is 138 copies/mL. A negative result does not preclude SARS-Cov-2 infection and should not be used as the sole basis for treatment or other patient management decisions. A negative result may occur with  improper specimen collection/handling, submission of specimen other than nasopharyngeal swab, presence of viral mutation(s) within the areas targeted by this assay, and inadequate number of viral copies(<138 copies/mL). A negative result must be combined with clinical observations, patient history, and epidemiological information. The expected result is Negative.  Fact Sheet for Patients:  EntrepreneurPulse.com.au  Fact Sheet for Healthcare Providers:  IncredibleEmployment.be  This test is no t yet approved or cleared by the [place] FDA and  has been authorized for detection and/or diagnosis of SARS-CoV-2 by FDA under an Emergency Use Authorization (EUA). This EUA will remain  in effect (meaning this test can be used) for the duration of the COVID-19 declaration under Section 564(b)(1) of the Act, 21 U.S.C.section 360bbb-3(b)(1), unless the authorization is terminated  or revoked sooner.       Influenza A by PCR NEGATIVE NEGATIVE Final    Influenza B by PCR NEGATIVE NEGATIVE Final    Comment: (NOTE) The Xpert Xpress SARS-CoV-2/FLU/RSV plus assay is intended as an aid in the diagnosis of influenza  from Nasopharyngeal swab specimens and should not be used as a sole basis for treatment. Nasal washings and aspirates are unacceptable for Xpert Xpress SARS-CoV-2/FLU/RSV testing.  Fact Sheet for Patients: EntrepreneurPulse.com.au  Fact Sheet for Healthcare Providers: IncredibleEmployment.be  This test is not yet approved or cleared by the [place] FDA and has been authorized for detection and/or diagnosis of SARS-CoV-2 by FDA under an Emergency Use Authorization (EUA). This EUA will remain in effect (meaning this test can be used) for the duration of the COVID-19 declaration under Section 564(b)(1) of the Act, 21 U.S.C. section 360bbb-3(b)(1), unless the authorization is terminated or revoked.  Performed at [facility]   Culture, blood (routine x 2)     Status: None (Preliminary result)   Collection Time: 01/15/21  7:28 PM   Specimen: Site Not Specified; Blood  Result Value Ref Range Status   Specimen Description SITE NOT SPECIFIED  Final   Special Requests   Final    BOTTLES DRAWN AEROBIC AND ANAEROBIC Blood Culture results may not be optimal due to an inadequate volume of blood received in culture bottles   Culture   Final    NO GROWTH 4 DAYS Performed at [facility], [place] [address] [place], [place] [address]    Report Status PENDING  Incomplete  Urine culture     Status: Abnormal   Collection Time: 01/15/21 10:45 PM   Specimen: Urine, Random  Result Value Ref Range Status   Specimen Description URINE, RANDOM  Final   Special Requests   Final    NONE Performed at [facility], [place] [address], [place], [place] [address]    Culture (A)  Final    10,000 COLONIES/mL MULTIPLE SPECIES PRESENT, SUGGEST RECOLLECTION   Report Status 01/17/2021  FINAL  Final  MRSA PCR Screening     Status: None   Collection Time: 01/15/21 10:48 PM   Specimen: Nasopharyngeal  Result Value Ref Range Status   MRSA by PCR NEGATIVE NEGATIVE Final    Comment:        The GeneXpert MRSA Assay (FDA approved for NASAL specimens only), is one component of a comprehensive MRSA colonization surveillance program. It is not intended to diagnose MRSA infection nor to guide or monitor treatment for MRSA infections. Performed at [facility], [place] [address], [place], [place] [address]   Culture, blood (routine x 2)     Status: None (Preliminary result)   Collection Time: 01/15/21 11:03 PM   Specimen: BLOOD  Result Value Ref Range Status   Specimen Description BLOOD SITE NOT SPECIFIED  Final   Special Requests   Final    BOTTLES DRAWN AEROBIC AND ANAEROBIC Blood Culture adequate volume   Culture   Final    NO GROWTH 3 DAYS Performed at [facility], [address], [place], [place] [address]    Report Status PENDING  Incomplete     Labs: BNP (last 3 results) No results for input(s): BNP in the last 8760 hours. Basic Metabolic Panel: Recent Labs  Lab 01/15/21 1600 01/15/21 1612 01/16/21 0325 01/17/21 0157 01/18/21 0130 01/19/21 0446  NA 142 141 138 140 140 138  K 3.9 4.1 3.7 3.8 4.8 3.7  CL 108  --  109 111 110 107  CO2 24  --  21* 20* 25 25  GLUCOSE 129*  --  91 90 105* 119*  BUN 18  --  16 14 13 9   CREATININE 1.68*  --  1.50* 1.20 1.29* 1.24  CALCIUM 8.7*  --  8.3* 8.2* 8.5* 8.5*  MG  --   --  1.9 2.1 1.9 1.8  PHOS  --   --  3.4 2.8 2.9 3.1   Liver Function Tests: Recent Labs  Lab 01/15/21 1600  AST 56*  ALT 21  ALKPHOS 59  BILITOT 0.5  PROT 6.3*  ALBUMIN 4.0   No results for input(s): LIPASE, AMYLASE in the last 168 hours. No results for input(s): AMMONIA in the last 168 hours. CBC: Recent Labs  Lab 01/15/21 1600 01/15/21 1612 01/16/21 0325 01/17/21 0157 01/18/21 0130 01/19/21 0446  WBC 8.7  --  8.5 6.3  6.4 5.7  NEUTROABS 6.5  --   --   --   --   --   HGB 14.0 12.9* 12.5* 12.6* 12.8* 12.6*  HCT 41.8 38.0* 36.5* 35.6* 37.8* 36.1*  MCV 97.2  --  96.8 94.7 98.2 95.8  PLT 215  --  180 176 176 177   Cardiac Enzymes: Recent Labs  Lab 01/15/21 1846 01/16/21 1040 01/17/21 0157  CKTOTAL 2,303* 1,745* 1,006*   BNP: Invalid input(s): POCBNP CBG: Recent Labs  Lab 01/15/21 1542  GLUCAP 123*   D-Dimer No results for input(s): DDIMER in the last 72 hours. Hgb A1c No results for input(s): HGBA1C in the last 72 hours. Lipid Profile No results for input(s): CHOL, HDL, LDLCALC, TRIG, CHOLHDL, LDLDIRECT in the last 72 hours. Thyroid function studies No results for input(s): TSH, T4TOTAL, T3FREE, THYROIDAB in the last 72 hours.  Invalid input(s): FREET3 Anemia work up No results for input(s): VITAMINB12, FOLATE, FERRITIN, TIBC, IRON, RETICCTPCT in the last 72 hours. Urinalysis    Component Value Date/Time   COLORURINE AMBER (A) 01/16/2021 0005   APPEARANCEUR HAZY (A) 01/16/2021 0005   LABSPEC 1.020 01/16/2021 0005   PHURINE 5.0 01/16/2021 0005   GLUCOSEU 150 (A) 01/16/2021 0005   HGBUR SMALL (A) 01/16/2021 0005   BILIRUBINUR NEGATIVE 01/16/2021 0005   KETONESUR 5 (A) 01/16/2021 0005   PROTEINUR 30 (A) 01/16/2021 0005   UROBILINOGEN 0.2 09/23/2014 1415   NITRITE NEGATIVE 01/16/2021 0005   LEUKOCYTESUR NEGATIVE 01/16/2021 0005   Sepsis Labs Invalid input(s): PROCALCITONIN,  WBC,  LACTICIDVEN Microbiology Recent Results (from the past 240 hour(s))  Resp Panel by RT-PCR (Flu A&B, Covid) Nasopharyngeal Swab     Status: None   Collection Time: 01/15/21  4:04 PM   Specimen: Nasopharyngeal Swab; Nasopharyngeal(NP) swabs in vial transport medium  Result Value Ref Range Status   SARS Coronavirus 2 by RT PCR NEGATIVE NEGATIVE Final    Comment: (NOTE) SARS-CoV-2 target nucleic acids are NOT DETECTED.  The SARS-CoV-2 RNA is generally detectable in upper respiratory specimens during the  acute phase of infection. The lowest concentration of SARS-CoV-2 viral copies this assay can detect is 138 copies/mL. A negative result does not preclude SARS-Cov-2 infection and should not be used as the sole basis for treatment or other patient management decisions. A negative result may occur with  improper specimen collection/handling, submission of specimen other than nasopharyngeal swab, presence of viral mutation(s) within the areas targeted by this assay, and inadequate number of viral copies(<138 copies/mL). A negative result must be combined with clinical observations, patient history, and epidemiological information. The expected result is Negative.  Fact Sheet for Patients:  EntrepreneurPulse.com.au  Fact Sheet for Healthcare Providers:  IncredibleEmployment.be  This test is no t yet approved or cleared by the [place] and  has been authorized  for detection and/or diagnosis of SARS-CoV-2 by FDA under an Emergency Use Authorization (EUA). This EUA will remain  in effect (meaning this test can be used) for the duration of the COVID-19 declaration under Section 564(b)(1) of the Act, 21 U.S.C.section 360bbb-3(b)(1), unless the authorization is terminated  or revoked sooner.       Influenza A by PCR NEGATIVE NEGATIVE Final   Influenza B by PCR NEGATIVE NEGATIVE Final    Comment: (NOTE) The Xpert Xpress SARS-CoV-2/FLU/RSV plus assay is intended as an aid in the diagnosis of influenza from Nasopharyngeal swab specimens and should not be used as a sole basis for treatment. Nasal washings and aspirates are unacceptable for Xpert Xpress SARS-CoV-2/FLU/RSV testing.  Fact Sheet for Patients: EntrepreneurPulse.com.au  Fact Sheet for Healthcare Providers: IncredibleEmployment.be  This test is not yet approved or cleared by the [place] FDA and has been authorized for detection and/or  diagnosis of SARS-CoV-2 by FDA under an Emergency Use Authorization (EUA). This EUA will remain in effect (meaning this test can be used) for the duration of the COVID-19 declaration under Section 564(b)(1) of the Act, 21 U.S.C. section 360bbb-3(b)(1), unless the authorization is terminated or revoked.  Performed at [facility]   Culture, blood (routine x 2)     Status: None (Preliminary result)   Collection Time: 01/15/21  7:28 PM   Specimen: Site Not Specified; Blood  Result Value Ref Range Status   Specimen Description SITE NOT SPECIFIED  Final   Special Requests   Final    BOTTLES DRAWN AEROBIC AND ANAEROBIC Blood Culture results may not be optimal due to an inadequate volume of blood received in culture bottles   Culture   Final    NO GROWTH 4 DAYS Performed at [facility], [place] [address] [place], [place] [address]    Report Status PENDING  Incomplete  Urine culture     Status: Abnormal   Collection Time: 01/15/21 10:45 PM   Specimen: Urine, Random  Result Value Ref Range Status   Specimen Description URINE, RANDOM  Final   Special Requests   Final    NONE Performed at [facility], [place] [address], [place], [place] [address]    Culture (A)  Final    10,000 COLONIES/mL MULTIPLE SPECIES PRESENT, SUGGEST RECOLLECTION   Report Status 01/17/2021 FINAL  Final  MRSA PCR Screening     Status: None   Collection Time: 01/15/21 10:48 PM   Specimen: Nasopharyngeal  Result Value Ref Range Status   MRSA by PCR NEGATIVE NEGATIVE Final    Comment:        The GeneXpert MRSA Assay (FDA approved for NASAL specimens only), is one component of a comprehensive MRSA colonization surveillance program. It is not intended to diagnose MRSA infection nor to guide or monitor treatment for MRSA infections. Performed at [facility], [place] [address], [place], [place] [address]   Culture, blood (routine x 2)     Status: None (Preliminary result)    Collection Time: 01/15/21 11:03 PM   Specimen: BLOOD  Result Value Ref Range Status   Specimen Description BLOOD SITE NOT SPECIFIED  Final   Special Requests   Final    BOTTLES DRAWN AEROBIC AND ANAEROBIC Blood Culture adequate volume   Culture   Final    NO GROWTH 3 DAYS Performed at [facility], [address] [place], [place] [address]    Report Status PENDING  Incomplete  Time coordinating discharge:32 minutes.   SIGNED:   Hosie Poisson, MD  [facility] 01/19/2021, 2:15 PM

## 2021-01-19 NOTE — Progress Notes (Signed)
Patient dressed, educated, waiting for walker to be delivered from case mgmt team. Family at bedside. Eager to return home. Belongings returned to patient family.

## 2021-01-19 NOTE — Progress Notes (Signed)
Occupational Therapy Treatment Patient Details Name: John [name] MRN: [id number] DOB: 1935/03/03 Today's Date: 01/19/2021    History of present illness 86 yo male admitted to [facility] on 3/24 with fall on 3/19 and again on 3/23, pt's daughter found pt down on 3/24 and pt states he had been crawling since fall. CTH shows bilateral frontal intraparenchymal hemorrhage with a subarachnoid component and IVH, no midline shift. PMH includes CAD, s/p CABG, aortic insufficiency, hypotension on florinef, DM2 (diet managed), mild dementia, parkinsonism, anxiety/depression.   OT comments  Patient seated in recliner and agreeable to OT session.  Completing transfers and mobility using RW with min guard for safety, reports preference to use RW today and requires min cueing for safety with RW.  He completes grooming standing with sink with min guard, cueing for sequencing and moving between tasks as patient perseverates on prior task.  Family reports plan to dc home with 24/7 support for 1-2 night and then move him into ALF when the bed is available.  Continue to recommend [facility] services at this time. Will follow.    Follow Up Recommendations  Home health OT;Supervision/Assistance - 24 hour    Equipment Recommendations  Tub/shower seat    Recommendations for Other Services      Precautions / Restrictions Precautions Precautions: Fall Restrictions Weight Bearing Restrictions: No       Mobility Bed Mobility               General bed mobility comments: OOB in recliner upon entry    Transfers Overall transfer level: Needs assistance Equipment used: Rolling walker (2 wheeled) Transfers: Sit to/from Stand Sit to Stand: Min guard         General transfer comment: min guard for safety    Balance Overall balance assessment: Needs assistance Sitting-balance support: Feet supported;No upper extremity supported Sitting balance-Leahy Scale: Good     Standing balance support: Bilateral  upper extremity supported;No upper extremity supported;During functional activity Standing balance-Leahy Scale: Fair                             ADL either performed or assessed with clinical judgement   ADL Overall ADL's : Needs assistance/impaired     Grooming: Wash/dry hands;Oral care;Min Dispensing optician: Min guard;Ambulation           Functional mobility during ADLs: Min guard;Rolling walker       Vision       Perception     Praxis      Cognition Arousal/Alertness: Awake/alert Behavior During Therapy: WFL for tasks assessed/performed;Flat affect Overall Cognitive Status: Impaired/Different from baseline                                 General Comments: baseline mild dementia, requires cueing for safety, redirection for tasks due to perseveration on prior task completed        Exercises     Shoulder Instructions       General Comments      Pertinent Vitals/ Pain       Pain Assessment: No/denies pain  Home Living  Prior Functioning/Environment              Frequency  Min 2X/week        Progress Toward Goals  OT Goals(current goals can now be found in the care plan section)  Progress towards OT goals: Progressing toward goals  Acute Rehab OT Goals Patient Stated Goal: be independent OT Goal Formulation: With patient  Plan Discharge plan remains appropriate;Frequency remains appropriate    Co-evaluation                 AM-PAC OT "6 Clicks" Daily Activity     Outcome Measure   Help from another person eating meals?: A Little Help from another person taking care of personal grooming?: A Little Help from another person toileting, which includes using toliet, bedpan, or urinal?: A Little Help from another person bathing (including washing, rinsing, drying)?: A Little Help from another person to put on and  taking off regular upper body clothing?: A Little Help from another person to put on and taking off regular lower body clothing?: A Little 6 Click Score: 18    End of Session Equipment Utilized During Treatment: Gait belt;Rolling walker  OT Visit Diagnosis: Unsteadiness on feet (R26.81);Other abnormalities of gait and mobility (R26.89);Muscle weakness (generalized) (M62.81)   Activity Tolerance Patient tolerated treatment well   Patient Left in chair;with call bell/phone within reach;with chair alarm set;with family/visitor present   Nurse Communication Mobility status        Time: 8101-7510 OT Time Calculation (min): 14 min  Charges: OT General Charges $OT Visit: 1 Visit OT Treatments $Self Care/Home Management : 8-22 mins  Jolaine Artist, OT [place] Pager [phone] Office [phone]    Delight Stare 01/19/2021, 12:21 PM

## 2021-01-19 NOTE — Plan of Care (Signed)

## 2021-01-20 LAB — CULTURE, BLOOD (ROUTINE X 2): Culture: NO GROWTH

## 2021-01-21 ENCOUNTER — Telehealth: Payer: Self-pay | Admitting: *Deleted

## 2021-01-21 ENCOUNTER — Other Ambulatory Visit: Payer: Self-pay | Admitting: *Deleted

## 2021-01-21 ENCOUNTER — Encounter: Payer: Self-pay | Admitting: Internal Medicine

## 2021-01-21 ENCOUNTER — Encounter: Payer: PPO | Admitting: Internal Medicine

## 2021-01-21 ENCOUNTER — Encounter: Payer: Self-pay | Admitting: *Deleted

## 2021-01-21 DIAGNOSIS — G4700 Insomnia, unspecified: Secondary | ICD-10-CM

## 2021-01-21 LAB — CULTURE, BLOOD (ROUTINE X 2)
Culture: NO GROWTH
Special Requests: ADEQUATE

## 2021-01-21 MED ORDER — QUETIAPINE FUMARATE 100 MG PO TABS: ORAL_TABLET | ORAL | Status: DC

## 2021-01-21 NOTE — Telephone Encounter (Signed)
Called patient on 01/21/2021 , 5:02 PM in an attempt to reach the patient for a hospital follow up. Spoke with daughter, Raye Sorrow.  Admit date: 01/15/21 Discharge: 01/19/21   He does not have any questions or concerns about medications from the hospital admission. The patient's medications were reviewed over the phone, they were counseled to bring in all current medications to the hospital follow up visit.   I advised the patient to call if any questions or concerns arise about the hospital admission or medications    Home health was not started in the hospital. Patient being admitted to [facility]. All questions were answered and a follow up appointment was made. Daughter will call the office to set up a hospital folloe appointment. Family wants to get patient settled at assisted living, prior to appointment.  Prior to Admission medications   Medication Sig Start Date End Date Taking? Authorizing Provider  amLODipine (NORVASC) 10 MG tablet Take 1 tablet (10 mg total) by mouth daily. 01/20/21   Hosie Poisson, MD  Cholecalciferol (VITAMIN D3) 2000 UNITS capsule Take 8,000 Units by mouth daily.    [provider]  erythromycin ophthalmic ointment Place into both eyes 4 (four) times daily. 01/19/21   Hosie Poisson, MD  escitalopram (LEXAPRO) 10 MG tablet Take  1 tablet  Daily  for Mood & Anxiety Patient taking differently: Take 10 mg by mouth daily. 11/27/20   Unk Pinto, MD  finasteride (PROSCAR) 5 MG tablet Take      1 tablet       Daily       for Prostate Patient taking differently: Take 5 mg by mouth daily. for Prostate 10/28/20   Unk Pinto, MD  fludrocortisone (FLORINEF) 0.1 MG tablet Take 1 tablet 2 x /day for Low BP Patient taking differently: Take 0.1 mg by mouth 2 (two) times daily. for Low BP 08/14/19   Unk Pinto, MD  glucose blood (FREESTYLE LITE) test strip TEST BLOOD SUGAR ONCE DAILY 06/01/19   Unk Pinto, MD  Lancets (FREESTYLE) lancets  1 each by Other route as needed for other. Use as instructed    [provider]  levETIRAcetam (KEPPRA) 500 MG tablet Take 1 tablet (500 mg total) by mouth 2 (two) times daily. 01/19/21   Hosie Poisson, MD  loperamide (IMODIUM) 2 MG capsule Take 1 capsule (2 mg total) by mouth as needed for diarrhea or loose stools. 01/19/21   Hosie Poisson, MD  Melatonin 5 MG CAPS Take 10 mg by mouth at bedtime.     [provider]  mirtazapine (REMERON SOL-TAB) 30 MG disintegrating tablet Take  1 tablet  at Bedtime  for Sleep & Appetite Patient taking differently: Take 30 mg by mouth at bedtime. for Sleep & Appetite 12/24/20   Unk Pinto, MD  nitroGLYCERIN (NITROSTAT) 0.4 MG SL tablet Sig: 1 tablet under tongue every 3 to 5 minutes as needed for Angina - Please Dispense # 2 bottles of # 25 tabs Patient taking differently: Place 0.4 mg under the tongue every 5 (five) minutes as needed for chest pain. 07/17/18   Unk Pinto, MD  pantoprazole (PROTONIX) 40 MG tablet TAKE ONE TABLET BY MOUTH ONCE DAILY Patient taking differently: Take 40 mg by mouth daily. 07/29/17   Vladimir Crofts, PA-C  polyethylene glycol (MIRALAX / GLYCOLAX) 17 g packet Take 17 g by mouth daily as needed for moderate constipation. 01/19/21   Hosie Poisson, MD  potassium citrate (UROCIT-K) 10 MEQ (1080 MG)  SR tablet Take 1 tablet 2 x /day to Prevent Kidney Stones Patient taking differently: Take 10 mEq by mouth in the morning and at bedtime. to Prevent Kidney Stones 10/01/19   Unk Pinto, MD  QUEtiapine (SEROQUEL) 100 MG tablet TAKE 1 TO 2 TABLETS BY MOUTH AT BEDTIME AS NEEDED FOR SLEEP Patient taking differently: Take 100-200 mg by mouth at bedtime as needed (sleep). 10/31/20   Garnet Sierras, NP  simvastatin (ZOCOR) 80 MG tablet Take 1 tablet at Bedtime for Cholesterol Patient taking differently: Take 80 mg by mouth at bedtime. for Cholesterol 10/01/19   Unk Pinto, MD  tamsulosin (FLOMAX) 0.4 MG CAPS capsule  Take 1 capsule (0.4 mg total) by mouth daily after supper. 01/19/21   Hosie Poisson, MD  vitamin C (ASCORBIC ACID) 500 MG tablet Take 500 mg by mouth daily.    [provider]  zinc gluconate 50 MG tablet Take 50 mg by mouth every other day.    [provider]

## 2021-01-21 NOTE — Telephone Encounter (Signed)
error 

## 2021-01-21 NOTE — Progress Notes (Signed)
PPD applied to patient's left forearm on 01/18/2021, at [facility]. PPD read 01/20/2021, with negative result, by Melbourne Abts, RN.

## 2021-01-22 ENCOUNTER — Other Ambulatory Visit: Payer: Self-pay | Admitting: *Deleted

## 2021-01-26 ENCOUNTER — Telehealth: Payer: Self-pay | Admitting: *Deleted

## 2021-01-26 NOTE — Telephone Encounter (Signed)
Medication order for Imodium 2 mg 2 tablets a bedtime for diarrhea faxed to [facility],per Dr Melford Aase.

## 2021-01-27 DIAGNOSIS — R2681 Unsteadiness on feet: Secondary | ICD-10-CM | POA: Diagnosis not present

## 2021-01-27 DIAGNOSIS — M6281 Muscle weakness (generalized): Secondary | ICD-10-CM | POA: Diagnosis not present

## 2021-01-30 ENCOUNTER — Other Ambulatory Visit: Payer: Self-pay | Admitting: Internal Medicine

## 2021-01-30 MED ORDER — LOPERAMIDE HCL 2 MG PO CAPS: ORAL_CAPSULE | ORAL | 0 refills | Status: DC

## 2021-01-31 DIAGNOSIS — G214 Vascular parkinsonism: Secondary | ICD-10-CM | POA: Diagnosis not present

## 2021-01-31 DIAGNOSIS — M6281 Muscle weakness (generalized): Secondary | ICD-10-CM | POA: Diagnosis not present

## 2021-01-31 DIAGNOSIS — R278 Other lack of coordination: Secondary | ICD-10-CM | POA: Diagnosis not present

## 2021-02-05 DIAGNOSIS — R2681 Unsteadiness on feet: Secondary | ICD-10-CM | POA: Diagnosis not present

## 2021-02-05 DIAGNOSIS — M6281 Muscle weakness (generalized): Secondary | ICD-10-CM | POA: Diagnosis not present

## 2021-02-06 DIAGNOSIS — M6281 Muscle weakness (generalized): Secondary | ICD-10-CM | POA: Diagnosis not present

## 2021-02-06 DIAGNOSIS — R278 Other lack of coordination: Secondary | ICD-10-CM | POA: Diagnosis not present

## 2021-02-06 DIAGNOSIS — G214 Vascular parkinsonism: Secondary | ICD-10-CM | POA: Diagnosis not present

## 2021-02-09 DIAGNOSIS — G214 Vascular parkinsonism: Secondary | ICD-10-CM | POA: Diagnosis not present

## 2021-02-09 DIAGNOSIS — M6281 Muscle weakness (generalized): Secondary | ICD-10-CM | POA: Diagnosis not present

## 2021-02-09 DIAGNOSIS — R278 Other lack of coordination: Secondary | ICD-10-CM | POA: Diagnosis not present

## 2021-02-11 ENCOUNTER — Telehealth: Payer: Self-pay | Admitting: *Deleted

## 2021-02-11 ENCOUNTER — Other Ambulatory Visit: Payer: Self-pay | Admitting: Internal Medicine

## 2021-02-11 MED ORDER — ALPRAZOLAM 0.25 MG PO TABS: ORAL_TABLET | ORAL | 0 refills | Status: DC

## 2021-02-11 MED ORDER — SPIRONOLACTONE 25 MG PO TABS: ORAL_TABLET | ORAL | 3 refills | Status: DC

## 2021-02-11 NOTE — Telephone Encounter (Signed)
Per Dr Melford Aase, new RX's for Xanax and Spirolactone for to the assisted living for the patient.

## 2021-02-16 DIAGNOSIS — R2681 Unsteadiness on feet: Secondary | ICD-10-CM | POA: Diagnosis not present

## 2021-02-16 DIAGNOSIS — M6281 Muscle weakness (generalized): Secondary | ICD-10-CM | POA: Diagnosis not present

## 2021-02-19 DIAGNOSIS — G214 Vascular parkinsonism: Secondary | ICD-10-CM | POA: Diagnosis not present

## 2021-02-19 DIAGNOSIS — M6281 Muscle weakness (generalized): Secondary | ICD-10-CM | POA: Diagnosis not present

## 2021-02-19 DIAGNOSIS — R278 Other lack of coordination: Secondary | ICD-10-CM | POA: Diagnosis not present

## 2021-02-20 DIAGNOSIS — Z20822 Contact with and (suspected) exposure to covid-19: Secondary | ICD-10-CM | POA: Diagnosis not present

## 2021-02-21 DIAGNOSIS — R2681 Unsteadiness on feet: Secondary | ICD-10-CM | POA: Diagnosis not present

## 2021-02-21 DIAGNOSIS — M6281 Muscle weakness (generalized): Secondary | ICD-10-CM | POA: Diagnosis not present

## 2021-02-24 DIAGNOSIS — M6281 Muscle weakness (generalized): Secondary | ICD-10-CM | POA: Diagnosis not present

## 2021-02-24 DIAGNOSIS — R2681 Unsteadiness on feet: Secondary | ICD-10-CM | POA: Diagnosis not present

## 2021-02-25 DIAGNOSIS — R278 Other lack of coordination: Secondary | ICD-10-CM | POA: Diagnosis not present

## 2021-02-25 DIAGNOSIS — G214 Vascular parkinsonism: Secondary | ICD-10-CM | POA: Diagnosis not present

## 2021-02-25 DIAGNOSIS — M6281 Muscle weakness (generalized): Secondary | ICD-10-CM | POA: Diagnosis not present

## 2021-02-26 DIAGNOSIS — Z20822 Contact with and (suspected) exposure to covid-19: Secondary | ICD-10-CM | POA: Diagnosis not present

## 2021-03-04 DIAGNOSIS — Z20822 Contact with and (suspected) exposure to covid-19: Secondary | ICD-10-CM | POA: Diagnosis not present

## 2021-03-05 DIAGNOSIS — R2681 Unsteadiness on feet: Secondary | ICD-10-CM | POA: Diagnosis not present

## 2021-03-05 DIAGNOSIS — M6281 Muscle weakness (generalized): Secondary | ICD-10-CM | POA: Diagnosis not present

## 2021-03-06 DIAGNOSIS — R278 Other lack of coordination: Secondary | ICD-10-CM | POA: Diagnosis not present

## 2021-03-06 DIAGNOSIS — M6281 Muscle weakness (generalized): Secondary | ICD-10-CM | POA: Diagnosis not present

## 2021-03-06 DIAGNOSIS — G214 Vascular parkinsonism: Secondary | ICD-10-CM | POA: Diagnosis not present

## 2021-03-07 ENCOUNTER — Other Ambulatory Visit: Payer: Self-pay | Admitting: Internal Medicine

## 2021-03-12 DIAGNOSIS — R2681 Unsteadiness on feet: Secondary | ICD-10-CM | POA: Diagnosis not present

## 2021-03-12 DIAGNOSIS — M6281 Muscle weakness (generalized): Secondary | ICD-10-CM | POA: Diagnosis not present

## 2021-03-13 DIAGNOSIS — R278 Other lack of coordination: Secondary | ICD-10-CM | POA: Diagnosis not present

## 2021-03-13 DIAGNOSIS — G214 Vascular parkinsonism: Secondary | ICD-10-CM | POA: Diagnosis not present

## 2021-03-13 DIAGNOSIS — M6281 Muscle weakness (generalized): Secondary | ICD-10-CM | POA: Diagnosis not present

## 2021-03-16 DIAGNOSIS — G214 Vascular parkinsonism: Secondary | ICD-10-CM | POA: Diagnosis not present

## 2021-03-16 DIAGNOSIS — M6281 Muscle weakness (generalized): Secondary | ICD-10-CM | POA: Diagnosis not present

## 2021-03-16 DIAGNOSIS — R278 Other lack of coordination: Secondary | ICD-10-CM | POA: Diagnosis not present

## 2021-03-17 DIAGNOSIS — R2681 Unsteadiness on feet: Secondary | ICD-10-CM | POA: Diagnosis not present

## 2021-03-17 DIAGNOSIS — M6281 Muscle weakness (generalized): Secondary | ICD-10-CM | POA: Diagnosis not present

## 2021-03-20 DIAGNOSIS — M6281 Muscle weakness (generalized): Secondary | ICD-10-CM | POA: Diagnosis not present

## 2021-03-20 DIAGNOSIS — R278 Other lack of coordination: Secondary | ICD-10-CM | POA: Diagnosis not present

## 2021-03-20 DIAGNOSIS — G214 Vascular parkinsonism: Secondary | ICD-10-CM | POA: Diagnosis not present

## 2021-03-23 DIAGNOSIS — M6281 Muscle weakness (generalized): Secondary | ICD-10-CM | POA: Diagnosis not present

## 2021-03-23 DIAGNOSIS — G214 Vascular parkinsonism: Secondary | ICD-10-CM | POA: Diagnosis not present

## 2021-03-23 DIAGNOSIS — R278 Other lack of coordination: Secondary | ICD-10-CM | POA: Diagnosis not present

## 2021-03-25 DIAGNOSIS — R278 Other lack of coordination: Secondary | ICD-10-CM | POA: Diagnosis not present

## 2021-03-25 DIAGNOSIS — G214 Vascular parkinsonism: Secondary | ICD-10-CM | POA: Diagnosis not present

## 2021-03-25 DIAGNOSIS — M6281 Muscle weakness (generalized): Secondary | ICD-10-CM | POA: Diagnosis not present

## 2021-04-01 DIAGNOSIS — R278 Other lack of coordination: Secondary | ICD-10-CM | POA: Diagnosis not present

## 2021-04-01 DIAGNOSIS — M6281 Muscle weakness (generalized): Secondary | ICD-10-CM | POA: Diagnosis not present

## 2021-04-01 DIAGNOSIS — G214 Vascular parkinsonism: Secondary | ICD-10-CM | POA: Diagnosis not present

## 2021-04-03 DIAGNOSIS — R278 Other lack of coordination: Secondary | ICD-10-CM | POA: Diagnosis not present

## 2021-04-03 DIAGNOSIS — M6281 Muscle weakness (generalized): Secondary | ICD-10-CM | POA: Diagnosis not present

## 2021-04-03 DIAGNOSIS — G214 Vascular parkinsonism: Secondary | ICD-10-CM | POA: Diagnosis not present

## 2021-04-07 ENCOUNTER — Other Ambulatory Visit: Payer: Self-pay | Admitting: Internal Medicine

## 2021-04-07 DIAGNOSIS — F419 Anxiety disorder, unspecified: Secondary | ICD-10-CM

## 2021-04-07 MED ORDER — ESCITALOPRAM OXALATE 10 MG PO TABS
ORAL_TABLET | ORAL | 0 refills | Status: DC
Start: 2021-04-07 — End: 2021-06-09

## 2021-04-07 MED ORDER — ALPRAZOLAM 0.5 MG PO TABS: ORAL_TABLET | ORAL | 2 refills | Status: DC

## 2021-04-09 DIAGNOSIS — M6281 Muscle weakness (generalized): Secondary | ICD-10-CM | POA: Diagnosis not present

## 2021-04-09 DIAGNOSIS — G214 Vascular parkinsonism: Secondary | ICD-10-CM | POA: Diagnosis not present

## 2021-04-09 DIAGNOSIS — R278 Other lack of coordination: Secondary | ICD-10-CM | POA: Diagnosis not present

## 2021-04-10 DIAGNOSIS — M6281 Muscle weakness (generalized): Secondary | ICD-10-CM | POA: Diagnosis not present

## 2021-04-10 DIAGNOSIS — R278 Other lack of coordination: Secondary | ICD-10-CM | POA: Diagnosis not present

## 2021-04-10 DIAGNOSIS — G214 Vascular parkinsonism: Secondary | ICD-10-CM | POA: Diagnosis not present

## 2021-04-16 ENCOUNTER — Ambulatory Visit (INDEPENDENT_AMBULATORY_CARE_PROVIDER_SITE_OTHER): Payer: PPO | Admitting: Internal Medicine

## 2021-04-16 ENCOUNTER — Other Ambulatory Visit: Payer: Self-pay

## 2021-04-16 DIAGNOSIS — F0150 Vascular dementia without behavioral disturbance: Secondary | ICD-10-CM | POA: Diagnosis not present

## 2021-04-16 DIAGNOSIS — I951 Orthostatic hypotension: Secondary | ICD-10-CM | POA: Diagnosis not present

## 2021-04-16 DIAGNOSIS — S20211A Contusion of right front wall of thorax, initial encounter: Secondary | ICD-10-CM

## 2021-04-18 ENCOUNTER — Encounter: Payer: Self-pay | Admitting: Internal Medicine

## 2021-04-18 MED ORDER — FLUDROCORTISONE ACETATE 0.1 MG PO TABS: ORAL_TABLET | ORAL | 3 refills | Status: DC

## 2021-04-18 NOTE — Progress Notes (Signed)
Future Appointments  Date Time Provider [place]  09/01/2021  2:05 PM [facility] 1 [facility]   History of Present Illness:      Patient is a very nice 86 yo WWM with HTN, ASCAD, Vascular Dementia, latent DM who was hospitalized 3/24-28  with:  SAH & multiple Frontal lobe hemorrhages /Hematomas with a non focal exam.   Consequently, he was dependent in all ADL's with very limited comprehension & insight and was discharged to Assisted Living at [facility] facility. Since there, he has received PT and is ambulatory with a walker or cane, but he remains very limited in comprehension or insight of his limitations. On occasion, he has been reported having poor social behaviors with exhibitionism. This past week , his family took him 4 days on a family beach outing & he is brought in today for evaluation after a fall and him c/o R chest wall pains.   Medications    fludrocortisone (FLORINEF) 0.1 MG , Take 1 tablet 2 x /day    NITROSTAT 0.4 MG SL tablet, as needed for Angina   simvastatin 80 MG tablet, Take 1 tablet at Bedtime    spironolactone  25 MG  Take  1 tablet  every morning  for BP, Fluid & Heart   ALPRAZolam 0.5 MG tablet, Take  1 tablet  3 x /day  before Meals   VITAMIN D; Take 8,000 Units daily.   LEXAPRO 10 MG  Take  1 tablet  Daily   finasteride 5 MG tablet, Take 1 tablet Daily    levETIRAcetam (KEPPRA) 500 MG , Take 1 tablet 2 times daily.   loperamide  2 MG capsule, Take 1 tablet qod   Melatonin 5 MG , Take 10 mg at bedtime.    pantoprazole  40 MG tablet, TAKE ONE TABLET  DAILY    polyethylene glycol  17 g packet, Take  daily as needed    potassium citrate  10 MEQ  SR tablet, Take 1 tablet 2 x /day to Prevent Kidney Stones    QUEtiapine 100 MG tablet, Take 1 tablet 1 hour before HS and may repeat in 2 hours, if needed.   tamsulosin 0.4 MG CAPS capsule, Take 1 capsule daily after supper.   vitamin C 500 MG tablet, Take  daily.   zinc gluconate  50 MG tablet, Take  every other day.  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 ([place]); History of TIA (transient ischemic attack); Asthma; COPD (chronic obstructive pulmonary disease) ([place]); GERD; History of colonic polyps; Gastric AV malformation; Medication management; Vitamin D deficiency; CKD stage G3a/A1, GFR 45-59 and albumin creatinine ratio <30 mg/g ([place]); Autonomic postural hypotension; Esophageal reflux; BMI 21.0-21.9, adult; Diabetic peripheral neuropathy ([place]); Insomnia; Acute medial meniscus tear of left knee; 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[place]); Adrenal insufficiency ([place]); Primary osteoarthritis of left knee; S/P left knee arthroscopy; Intracranial bleed ([place]); SAH (subarachnoid hemorrhage) ([place]); Acute conjunctivitis of both eyes; and Non-traumatic rhabdomyolysis on their problem list.   Observations/Objective:  Postural Sitting BP 119/66   P 51        &         Standing BP 98/54      P 71  Respirations 12/min regular not shallow & no splinting. Afebrile   Skin - clear w/o rash, cyanosis , icterus.  HEENT - WNL. Neck - supple.  Chest - Clear equal BS.Tender Rt lateral chest  wall. No bruising noted. Cor - Nl HS. RRR w/o sig MGR. PP 1(+). No edema. MS- FROM w/o deformities.  Gait Nl. Neuro -  Nl w/o focal abnormalities. Poor Insight & limited comprehension.   Assessment and Plan:  1. Autonomic postural hypotension  - Increase fludrocortisone (FLORINEF) 0.1 MG tablet; Take 2 tablets  2 x /day   2. Chest wall contusion   3. Vascular dementia  ([place])   Follow Up Instructions:        I discussed the assessment and treatment plan with the patient. The patient was provided an opportunity to ask questions and all were answered. The patient agreed with the plan and demonstrated an understanding of the instructions.       The patient was advised to call back or seek an  in-person evaluation if the symptoms worsen or if the condition fails to improve as anticipated.    Kirtland Bouchard, MD

## 2021-04-21 DIAGNOSIS — M6281 Muscle weakness (generalized): Secondary | ICD-10-CM | POA: Diagnosis not present

## 2021-04-21 DIAGNOSIS — R278 Other lack of coordination: Secondary | ICD-10-CM | POA: Diagnosis not present

## 2021-04-21 DIAGNOSIS — G214 Vascular parkinsonism: Secondary | ICD-10-CM | POA: Diagnosis not present

## 2021-04-23 DIAGNOSIS — G214 Vascular parkinsonism: Secondary | ICD-10-CM | POA: Diagnosis not present

## 2021-04-23 DIAGNOSIS — R278 Other lack of coordination: Secondary | ICD-10-CM | POA: Diagnosis not present

## 2021-04-23 DIAGNOSIS — M6281 Muscle weakness (generalized): Secondary | ICD-10-CM | POA: Diagnosis not present

## 2021-04-26 ENCOUNTER — Other Ambulatory Visit: Payer: Self-pay | Admitting: Internal Medicine

## 2021-04-26 MED ORDER — AMLODIPINE BESYLATE 10 MG PO TABS: ORAL_TABLET | ORAL | 1 refills | Status: DC

## 2021-04-27 DIAGNOSIS — G214 Vascular parkinsonism: Secondary | ICD-10-CM | POA: Diagnosis not present

## 2021-04-27 DIAGNOSIS — M6281 Muscle weakness (generalized): Secondary | ICD-10-CM | POA: Diagnosis not present

## 2021-04-27 DIAGNOSIS — R278 Other lack of coordination: Secondary | ICD-10-CM | POA: Diagnosis not present

## 2021-05-04 DIAGNOSIS — M6281 Muscle weakness (generalized): Secondary | ICD-10-CM | POA: Diagnosis not present

## 2021-05-04 DIAGNOSIS — G214 Vascular parkinsonism: Secondary | ICD-10-CM | POA: Diagnosis not present

## 2021-05-04 DIAGNOSIS — R278 Other lack of coordination: Secondary | ICD-10-CM | POA: Diagnosis not present

## 2021-05-06 DIAGNOSIS — R278 Other lack of coordination: Secondary | ICD-10-CM | POA: Diagnosis not present

## 2021-05-06 DIAGNOSIS — G214 Vascular parkinsonism: Secondary | ICD-10-CM | POA: Diagnosis not present

## 2021-05-06 DIAGNOSIS — M6281 Muscle weakness (generalized): Secondary | ICD-10-CM | POA: Diagnosis not present

## 2021-05-11 ENCOUNTER — Other Ambulatory Visit: Payer: Self-pay | Admitting: Internal Medicine

## 2021-05-11 MED ORDER — QUETIAPINE FUMARATE 25 MG PO TABS: ORAL_TABLET | ORAL | Status: DC

## 2021-05-11 MED ORDER — ALPRAZOLAM ER 0.5 MG PO TB24: ORAL_TABLET | ORAL | 3 refills | Status: DC

## 2021-05-11 MED ORDER — QUETIAPINE FUMARATE 50 MG PO TABS: ORAL_TABLET | ORAL | Status: DC

## 2021-05-12 ENCOUNTER — Other Ambulatory Visit: Payer: Self-pay | Admitting: Internal Medicine

## 2021-05-13 DIAGNOSIS — R278 Other lack of coordination: Secondary | ICD-10-CM | POA: Diagnosis not present

## 2021-05-13 DIAGNOSIS — M6281 Muscle weakness (generalized): Secondary | ICD-10-CM | POA: Diagnosis not present

## 2021-05-13 DIAGNOSIS — G214 Vascular parkinsonism: Secondary | ICD-10-CM | POA: Diagnosis not present

## 2021-05-18 ENCOUNTER — Other Ambulatory Visit: Payer: Self-pay | Admitting: Internal Medicine

## 2021-05-29 ENCOUNTER — Other Ambulatory Visit: Payer: Self-pay | Admitting: Internal Medicine

## 2021-05-29 ENCOUNTER — Ambulatory Visit: Payer: PPO | Admitting: Internal Medicine

## 2021-05-29 VITALS — BP 134/78 | HR 72 | Temp 98.0°F | Resp 14 | Ht 71.0 in | Wt 177.0 lb

## 2021-05-29 DIAGNOSIS — L02212 Cutaneous abscess of back [any part, except buttock]: Secondary | ICD-10-CM

## 2021-05-29 DIAGNOSIS — I10 Essential (primary) hypertension: Secondary | ICD-10-CM

## 2021-05-29 MED ORDER — CEPHALEXIN 500 MG PO CAPS: ORAL_CAPSULE | ORAL | 0 refills | Status: DC

## 2021-06-01 ENCOUNTER — Other Ambulatory Visit: Payer: Self-pay

## 2021-06-01 ENCOUNTER — Ambulatory Visit: Payer: PPO | Admitting: Internal Medicine

## 2021-06-01 VITALS — HR 56 | Temp 98.0°F | Ht 71.0 in | Wt 177.0 lb

## 2021-06-01 DIAGNOSIS — L02212 Cutaneous abscess of back [any part, except buttock]: Secondary | ICD-10-CM

## 2021-06-01 NOTE — Progress Notes (Signed)
Future Appointments  Date Time Provider [place]  09/01/2021  2:05 PM [facility] ECHO 1 [facility]    History of Present Illness:      Patient returns post excision of the back on 05/29/21.     Medications  Current Outpatient Medications (Endocrine & Metabolic):    fludrocortisone (FLORINEF) 0.1 MG tablet, Take 2 tablets  2 x /day for Low BP  Current Outpatient Medications (Cardiovascular):    amLODipine (NORVASC) 10 MG tablet, Take  1 tablet  Daily  (every 24 hours) if Standing Systolic BP is over 0000000   nitroGLYCERIN (NITROSTAT) 0.4 MG SL tablet, Sig: 1 tablet under tongue every 3 to 5 minutes as needed for Angina - Please Dispense # 2 bottles of # 25 tabs (Patient taking differently: Place 0.4 mg under the tongue every 5 (five) minutes as needed for chest pain.)   simvastatin (ZOCOR) 80 MG tablet, Take 1 tablet at Bedtime for Cholesterol (Patient taking differently: Take 80 mg by mouth at bedtime. for Cholesterol)   spironolactone (ALDACTONE) 25 MG tablet, Take  1 tablet  every morning  for BP, Fluid & Heart   ALPRAZolam (XANAX XR) 0.5 MG 24 hr tablet, Take  1 tablet  Daily  at Noon   cephALEXin (KEFLEX) 500 MG capsule, Take 1 capsule 4 x/day after meals & bedtime for infection   Cholecalciferol (VITAMIN D3) 2000 UNITS capsule, Take 8,000 Units by mouth daily.   erythromycin ophthalmic ointment, Place into both eyes 4 (four) times daily.   escitalopram (LEXAPRO) 10 MG tablet, Take  1 tablet  Daily  for Mood & Anxiety   finasteride (PROSCAR) 5 MG tablet, Take      1 tablet       Daily       for Prostate (Patient taking differently: Take 5 mg by mouth daily. for Prostate)   glucose blood (FREESTYLE LITE) test strip, TEST BLOOD SUGAR ONCE DAILY   Lancets (FREESTYLE) lancets, 1 each by Other route as needed for other. Use as instructed   levETIRAcetam (KEPPRA) 500 MG tablet, Take 1 tablet (500 mg total) by mouth 2 (two) times daily.   loperamide (IMODIUM) 2 MG  capsule, Take 1 tablet qod - hs  - even days   Melatonin 5 MG CAPS, Take 10 mg by mouth at bedtime.    pantoprazole (PROTONIX) 40 MG tablet, TAKE ONE TABLET BY MOUTH ONCE DAILY (Patient taking differently: Take 40 mg by mouth daily.)   polyethylene glycol (MIRALAX / GLYCOLAX) 17 g packet, Take 17 g by mouth daily as needed for moderate constipation.   potassium citrate (UROCIT-K) 10 MEQ (1080 MG) SR tablet, Take 1 tablet 2 x /day to Prevent Kidney Stones (Patient taking differently: Take 10 mEq by mouth in the morning and at bedtime. to Prevent Kidney Stones)   QUEtiapine (SEROQUEL) 25 MG tablet, TAKE ONE TABLET BY MOUTH 3 TIMES A DAY BEFORE MEALS TAKE 2 TABLETS ('50MG'$ ) BY MOUTH AT BEDTIME.   tamsulosin (FLOMAX) 0.4 MG CAPS capsule, Take 1 capsule (0.4 mg total) by mouth daily after supper.   vitamin C (ASCORBIC ACID) 500 MG tablet, Take 500 mg by mouth daily.   zinc gluconate 50 MG tablet, Take 50 mg by mouth every other day.  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 ([place]); History of TIA (transient ischemic attack); Asthma; COPD (chronic obstructive pulmonary disease) ([place]); GERD; History of colonic polyps;  Gastric AV malformation; Medication management; Vitamin D deficiency; CKD stage G3a/A1, GFR 45-59 and albumin creatinine ratio <30 mg/g ([place]); Autonomic postural hypotension; Esophageal reflux; BMI 21.0-21.9, adult; Diabetic peripheral neuropathy ([place]); Insomnia; Acute medial meniscus tear of left knee; 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[place]); Adrenal insufficiency ([place]); Primary osteoarthritis of left knee; S/P left knee arthroscopy; Intracranial bleed ([place]); SAH (subarachnoid hemorrhage) ([place]); Acute conjunctivitis of both eyes; and Non-traumatic rhabdomyolysis on their problem list.   Observations/Objective:  Pulse (!) 56   Temp 98 F (36.7 C)   Ht '5\' 11"'$  (1.803 m)   Wt 177 lb (80.3 kg)   BMI  24.69 kg/m     WOUND CHECK OF SURGICAL SITE    Wound appears clean w/o sign of infection .   Packing removed & wound  irrigated with 30 ml of H2O2  New  iodoform packing inserted into wounds  Sterile Non-Adherent gauze applied & covered with a 4" x 6" Tegaderm   Assessment and Plan:   1. Abscess of back   - ROV 1 week to re-evaluate   Follow Up Instructions:        I discussed the assessment and treatment plan with the patient. The patient was provided an opportunity to ask questions and all were answered.    Kirtland Bouchard, MD

## 2021-06-04 ENCOUNTER — Other Ambulatory Visit: Payer: Self-pay

## 2021-06-09 ENCOUNTER — Other Ambulatory Visit: Payer: Self-pay | Admitting: Internal Medicine

## 2021-06-09 DIAGNOSIS — I951 Orthostatic hypotension: Secondary | ICD-10-CM

## 2021-06-09 DIAGNOSIS — F419 Anxiety disorder, unspecified: Secondary | ICD-10-CM

## 2021-06-09 MED ORDER — ESCITALOPRAM OXALATE 10 MG PO TABS: ORAL_TABLET | ORAL | 1 refills | Status: DC

## 2021-06-09 MED ORDER — SPIRONOLACTONE 25 MG PO TABS: ORAL_TABLET | ORAL | 1 refills | Status: DC

## 2021-06-09 MED ORDER — FLUDROCORTISONE ACETATE 0.1 MG PO TABS: ORAL_TABLET | ORAL | 1 refills | Status: DC

## 2021-06-11 NOTE — Progress Notes (Signed)
Future Appointments  Date Time Provider [place]  06/12/2021  9:30 AM Unk Pinto, MD [facility] None  09/01/2021  2:05 PM [facility] 1 [facility]    History of Present Illness:  Patient returns for  2sd f/u   post excision large infected sebaceous cyst of the back 05/29/2021.    Medications  Current Outpatient Medications (Endocrine & Metabolic):    fludrocortisone (FLORINEF) 0.1 MG tablet, TAKE ONE TABLET  EVERY MORNING **DO NOT HOLD**  Current Outpatient Medications (Cardiovascular):    amLODipine (NORVASC) 10 MG tablet, Take  1 tablet  Daily  (every 24 hours) if Standing Systolic BP is over 0000000   nitroGLYCERIN (NITROSTAT) 0.4 MG SL tablet, Sig: 1 tablet under tongue every 3 to 5 minutes as needed for Angina - Please Dispense # 2 bottles of # 25 tabs (Patient taking differently: Place 0.4 mg under the tongue every 5 (five) minutes as needed for chest pain.)   simvastatin (ZOCOR) 80 MG tablet, Take 1 tablet at Bedtime for Cholesterol (Patient taking differently: Take 80 mg by mouth at bedtime. for Cholesterol)   spironolactone (ALDACTONE) 25 MG tablet, TAKE ONE TABLET  EVERY MORNING FOR BLOOD PRESSURE AND HEART   Current Outpatient Medications (Analgesics):    PAIN RELIEF EXTRA STRENGTH 500 MG tablet, TAKE TWO TABLETS (1,'000MG'$ ) FOUR TIMES A DAY WITH MEALS AND BEDTIME   Current Outpatient Medications (Other):    ALPRAZolam (XANAX XR) 0.5 MG 24 hr tablet, Take  1 tablet  Daily  at Noon   cephALEXin (KEFLEX) 500 MG capsule, Take 1 capsule 4 x/day after meals & bedtime for infection   Cholecalciferol (VITAMIN D3) 2000 UNITS capsule, Take 8,000 Units by mouth daily.   erythromycin ophthalmic ointment, Place into both eyes 4 (four) times daily.   escitalopram (LEXAPRO) 10 MG tablet, TAKE ONE TABLET DAILY FOR MOOD AND ANXIETY   finasteride (PROSCAR) 5 MG tablet, Take      1 tablet       Daily       for Prostate (Patient taking differently: Take 5 mg by  mouth daily. for Prostate)   glucose blood (FREESTYLE LITE) test strip, TEST BLOOD SUGAR ONCE DAILY   Lancets (FREESTYLE) lancets, 1 each by Other route as needed for other. Use as instructed   levETIRAcetam (KEPPRA) 500 MG tablet, Take 1 tablet (500 mg total) by mouth 2 (two) times daily.   loperamide (IMODIUM) 2 MG capsule, Take 1 tablet qod - hs  - even days   Melatonin 5 MG CAPS, Take 10 mg by mouth at bedtime.    pantoprazole (PROTONIX) 40 MG tablet, TAKE ONE TABLET BY MOUTH ONCE DAILY (Patient taking differently: Take 40 mg by mouth daily.)   polyethylene glycol (MIRALAX / GLYCOLAX) 17 g packet, Take 17 g by mouth daily as needed for moderate constipation.   potassium citrate (UROCIT-K) 10 MEQ (1080 MG) SR tablet, Take 1 tablet 2 x /day to Prevent Kidney Stones (Patient taking differently: Take 10 mEq by mouth in the morning and at bedtime. to Prevent Kidney Stones)   QUEtiapine (SEROQUEL) 25 MG tablet, TAKE ONE TABLET BY MOUTH 3 TIMES A DAY BEFORE MEALS TAKE 2 TABLETS ('50MG'$ ) BY MOUTH AT BEDTIME.   tamsulosin (FLOMAX) 0.4 MG CAPS capsule, Take 1 capsule (0.4 mg total) by mouth daily after supper.   vitamin C (ASCORBIC ACID) 500 MG tablet, Take 500 mg by mouth daily.   zinc gluconate 50 MG tablet, Take 50 mg by mouth  every other day.  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 ([place]); History of TIA (transient ischemic attack); Asthma; COPD (chronic obstructive pulmonary disease) ([place]); GERD; History of colonic polyps; Gastric AV malformation; Medication management; Vitamin D deficiency; CKD stage G3a/A1, GFR 45-59 and albumin creatinine ratio <30 mg/g ([place]); Autonomic postural hypotension; Esophageal reflux; BMI 21.0-21.9, adult; Diabetic peripheral neuropathy ([place]); Insomnia; Acute medial meniscus tear of left knee; 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[place]); Adrenal insufficiency ([place]); Primary  osteoarthritis of left knee; S/P left knee arthroscopy; Intracranial bleed ([place]); SAH (subarachnoid hemorrhage) ([place]); Acute conjunctivitis of both eyes; and Non-traumatic rhabdomyolysis on their problem list.   Observations/Objective:  BP 136/60   Pulse 83   Temp 97.7 F (36.5 C)   Resp 16   SpO2 97%   WOUND CHECK OF SURGICAL SITE                            Wound appears clean w/o sign of infection .   ^ sutures removed   Packing removed & wound  irrigated with 30 ml of H2O2   New  8-10  iodoform packing inserted into wound   Sterile Non-Adherent gauze applied & covered with a 4" x 6" Tegaderm    Assessment and Plan:   1. Abscess of back   Follow Up Instructions:     Return in 1 week to recheck.         I discussed the assessment and treatment plan with the patient. The patient was provided an opportunity to ask questions and all were answered. The patient agreed with the plan and demonstrated an understanding of the instructions.       The patient was advised to call back or seek an in-person evaluation if the symptoms worsen or if the condition fails to improve as anticipated.    Kirtland Bouchard, MD

## 2021-06-12 ENCOUNTER — Ambulatory Visit: Payer: PPO | Admitting: Internal Medicine

## 2021-06-12 ENCOUNTER — Other Ambulatory Visit: Payer: Self-pay

## 2021-06-12 VITALS — BP 136/60 | HR 83 | Temp 97.7°F | Resp 16

## 2021-06-12 DIAGNOSIS — L02212 Cutaneous abscess of back [any part, except buttock]: Secondary | ICD-10-CM

## 2021-06-18 NOTE — Progress Notes (Signed)
Future Appointments  Date Time Provider [place]  06/19/2021  9:30 AM Unk Pinto, MD [facility] None  09/01/2021  2:05 PM [facility]    History of Present Illness:     Patient is a nice 86 yo WWM returning for f/u pst I & D of a large infecter sebaceous cyst of his back on 08/08.2022. He returns for  dressing change and replacement of Iodoform packing.   Medications  Current Outpatient Medications (Endocrine & Metabolic):    fludrocortisone (FLORINEF) 0.1 MG tablet, TAKE ONE TABLET  EVERY MORNING **DO NOT HOLD**  Current Outpatient Medications (Cardiovascular):    amLODipine (NORVASC) 10 MG tablet, Take  1 tablet  Daily  (every 24 hours) if Standing Systolic BP is over 0000000   nitroGLYCERIN (NITROSTAT) 0.4 MG SL tablet, Sig: 1 tablet under tongue every 3 to 5 minutes as needed for Angina - Please Dispense # 2 bottles of # 25 tabs (Patient taking differently: Place 0.4 mg under the tongue every 5 (five) minutes as needed for chest pain.)   simvastatin (ZOCOR) 80 MG tablet, Take 1 tablet at Bedtime for Cholesterol (Patient taking differently: Take 80 mg by mouth at bedtime. for Cholesterol)   spironolactone (ALDACTONE) 25 MG tablet, TAKE ONE TABLET  EVERY MORNING FOR BLOOD PRESSURE AND HEART   Current Outpatient Medications (Analgesics):    PAIN RELIEF EXTRA STRENGTH 500 MG tablet, TAKE TWO TABLETS (1,'000MG'$ ) FOUR TIMES A DAY WITH MEALS AND BEDTIME   Current Outpatient Medications (Other):    ALPRAZolam (XANAX XR) 0.5 MG 24 hr tablet, Take  1 tablet  Daily  at Noon   cephALEXin (KEFLEX) 500 MG capsule, Take 1 capsule 4 x/day after meals & bedtime for infection   Cholecalciferol (VITAMIN D3) 2000 UNITS capsule, Take 8,000 Units by mouth daily.   erythromycin ophthalmic ointment, Place into both eyes 4 (four) times daily.   escitalopram (LEXAPRO) 10 MG tablet, TAKE ONE TABLET DAILY FOR MOOD AND ANXIETY   finasteride (PROSCAR) 5 MG tablet,  Take      1 tablet       Daily       for Prostate (Patient taking differently: Take 5 mg by mouth daily. for Prostate)   glucose blood (FREESTYLE LITE) test strip, TEST BLOOD SUGAR ONCE DAILY   Lancets (FREESTYLE) lancets, 1 each by Other route as needed for other. Use as instructed   levETIRAcetam (KEPPRA) 500 MG tablet, Take 1 tablet (500 mg total) by mouth 2 (two) times daily.   loperamide (IMODIUM) 2 MG capsule, Take 1 tablet qod - hs  - even days   Melatonin 5 MG CAPS, Take 10 mg by mouth at bedtime.    pantoprazole (PROTONIX) 40 MG tablet, TAKE ONE TABLET BY MOUTH ONCE DAILY (Patient taking differently: Take 40 mg by mouth daily.)   polyethylene glycol (MIRALAX / GLYCOLAX) 17 g packet, Take 17 g by mouth daily as needed for moderate constipation.   potassium citrate (UROCIT-K) 10 MEQ (1080 MG) SR tablet, Take 1 tablet 2 x /day to Prevent Kidney Stones (Patient taking differently: Take 10 mEq by mouth in the morning and at bedtime. to Prevent Kidney Stones)   QUEtiapine (SEROQUEL) 25 MG tablet, TAKE ONE TABLET BY MOUTH 3 TIMES A DAY BEFORE MEALS TAKE 2 TABLETS ('50MG'$ ) BY MOUTH AT BEDTIME.   tamsulosin (FLOMAX) 0.4 MG CAPS capsule, Take 1 capsule (0.4 mg total) by mouth daily after supper.   vitamin C (ASCORBIC ACID) 500  MG tablet, Take 500 mg by mouth daily.   zinc gluconate 50 MG tablet, Take 50 mg by mouth every other day.  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 ([place]); History of TIA (transient ischemic attack); Asthma; COPD (chronic obstructive pulmonary disease) ([place]); GERD; History of colonic polyps; Gastric AV malformation; Medication management; Vitamin D deficiency; CKD stage G3a/A1, GFR 45-59 and albumin creatinine ratio <30 mg/g ([place]); Autonomic postural hypotension; Esophageal reflux; BMI 21.0-21.9, adult; Diabetic peripheral neuropathy ([place]); Insomnia; Acute medial meniscus  tear of left knee; 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[place]); Adrenal insufficiency ([place]); Primary osteoarthritis of left knee; S/P left knee arthroscopy; Intracranial bleed ([place]); SAH (subarachnoid hemorrhage) ([place]); Acute conjunctivitis of both eyes; and Non-traumatic rhabdomyolysis on their problem list.   Observations/Objective:  Temp 98 F (36.7 C)   Ht '5\' 11"'$  (1.803 m)   Wt 177 lb (80.3 kg)   BMI 24.69 kg/m       Wound appears clean. Iodoform packing removed . Irrigated x 3 with H2O2.  ~ 6" Iodoform packing inserted into wound. Sterile dressing applied.    Assessment and Plan:  1. Abscess of back  Follow Up Instructions:        I discussed the assessment and treatment plan with the patient. The patient was provided an opportunity to ask questions and all were answered. The patient agreed with the plan and demonstrated an understanding of the instructions.       The patient was advised to call back or seek an in-person evaluation if the symptoms worsen or if the condition fails to improve as anticipated.    Kirtland Bouchard, MD

## 2021-06-19 ENCOUNTER — Other Ambulatory Visit: Payer: Self-pay

## 2021-06-19 ENCOUNTER — Ambulatory Visit: Payer: PPO | Admitting: Internal Medicine

## 2021-06-19 VITALS — Temp 98.0°F | Ht 71.0 in | Wt 177.0 lb

## 2021-06-19 DIAGNOSIS — L02212 Cutaneous abscess of back [any part, except buttock]: Secondary | ICD-10-CM

## 2021-06-25 NOTE — Progress Notes (Signed)
Future Appointments  Date Time Provider [place]  06/26/2021  9:30 AM Unk Pinto, MD [facility] None  09/01/2021  2:05 PM [facility] 1 [facility]    History of Present Illness:      This nice 86 yo WWM returning for f/u post I & D of a large infected  sebaceous cyst of his left upper back on 06/01/2021. He returns for  dressing change and replacement of Iodoform packing.  In the interim betw visits his daughter (an Therapist, sports) is changing bandages every 2 days.   Medications  Current Outpatient Medications (Endocrine & Metabolic):    fludrocortisone (FLORINEF) 0.1 MG tablet, TAKE ONE TABLET  EVERY MORNING **DO NOT HOLD**  Current Outpatient Medications (Cardiovascular):    amLODipine (NORVASC) 10 MG tablet, Take  1 tablet  Daily  (every 24 hours) if Standing Systolic BP is over 0000000   nitroGLYCERIN (NITROSTAT) 0.4 MG SL tablet, Sig: 1 tablet under tongue every 3 to 5 minutes as needed for Angina - Please Dispense # 2 bottles of # 25 tabs (Patient taking differently: Place 0.4 mg under the tongue every 5 (five) minutes as needed for chest pain.)   simvastatin (ZOCOR) 80 MG tablet, Take 1 tablet at Bedtime for Cholesterol (Patient taking differently: Take 80 mg by mouth at bedtime. for Cholesterol)   spironolactone (ALDACTONE) 25 MG tablet, TAKE ONE TABLET  EVERY MORNING FOR BLOOD PRESSURE AND HEART   Current Outpatient Medications (Analgesics):    PAIN RELIEF EXTRA STRENGTH 500 MG tablet, TAKE TWO TABLETS (1,'000MG'$ ) FOUR TIMES A DAY WITH MEALS AND BEDTIME   Current Outpatient Medications (Other):    ALPRAZolam (XANAX XR) 0.5 MG 24 hr tablet, Take  1 tablet  Daily  at Noon   cephALEXin (KEFLEX) 500 MG capsule, Take 1 capsule 4 x/day after meals & bedtime for infection   Cholecalciferol (VITAMIN D3) 2000 UNITS capsule, Take 8,000 Units by mouth daily.   erythromycin ophthalmic ointment, Place into both eyes 4 (four) times daily.   escitalopram (LEXAPRO) 10 MG  tablet, TAKE ONE TABLET DAILY FOR MOOD AND ANXIETY   finasteride (PROSCAR) 5 MG tablet, Take      1 tablet       Daily       for Prostate (Patient taking differently: Take 5 mg by mouth daily. for Prostate)   glucose blood (FREESTYLE LITE) test strip, TEST BLOOD SUGAR ONCE DAILY   Lancets (FREESTYLE) lancets, 1 each by Other route as needed for other. Use as instructed   levETIRAcetam (KEPPRA) 500 MG tablet, Take 1 tablet (500 mg total) by mouth 2 (two) times daily.   loperamide (IMODIUM) 2 MG capsule, Take 1 tablet qod - hs  - even days   Melatonin 5 MG CAPS, Take 10 mg by mouth at bedtime.    pantoprazole (PROTONIX) 40 MG tablet, TAKE ONE TABLET BY MOUTH ONCE DAILY (Patient taking differently: Take 40 mg by mouth daily.)   polyethylene glycol (MIRALAX / GLYCOLAX) 17 g packet, Take 17 g by mouth daily as needed for moderate constipation.   potassium citrate (UROCIT-K) 10 MEQ (1080 MG) SR tablet, Take 1 tablet 2 x /day to Prevent Kidney Stones (Patient taking differently: Take 10 mEq by mouth in the morning and at bedtime. to Prevent Kidney Stones)   QUEtiapine (SEROQUEL) 25 MG tablet, TAKE ONE TABLET BY MOUTH 3 TIMES A DAY BEFORE MEALS TAKE 2 TABLETS ('50MG'$ ) BY MOUTH AT BEDTIME.   tamsulosin (FLOMAX) 0.4 MG CAPS capsule,  Take 1 capsule (0.4 mg total) by mouth daily after supper.   vitamin C (ASCORBIC ACID) 500 MG tablet, Take 500 mg by mouth daily.   zinc gluconate 50 MG tablet, Take 50 mg by mouth every other day.  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 ([place]); History of TIA (transient ischemic attack); Asthma; COPD (chronic obstructive pulmonary disease) ([place]); GERD; History of colonic polyps; Gastric AV malformation; Medication management; Vitamin D deficiency; CKD stage G3a/A1, GFR 45-59 and albumin creatinine ratio <30 mg/g ([place]); Autonomic postural hypotension; Esophageal reflux; BMI  21.0-21.9, adult; Diabetic peripheral neuropathy ([place]); Insomnia; Acute medial meniscus tear of left knee; 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[place]); Adrenal insufficiency ([place]); Primary osteoarthritis of left knee; S/P left knee arthroscopy; Intracranial bleed ([place]); SAH (subarachnoid hemorrhage) ([place]); Acute conjunctivitis of both eyes; and Non-traumatic rhabdomyolysis on their problem list.   Observations/Objective:  Pulse 63   Temp (!) 97.1 F (36.2 C)   Resp 16   Ht '5\' 11"'$  (1.803 m)   Wt 177 lb (80.3 kg)   SpO2 98%   BMI 24.69 kg/m   Skin focused exam of the wound site of his left upper back appears clean. All sutures finally removed.  ~ 8-10 " iodoform packing removed. Approx 5 ml of betadine /sugar compound instilled in wound and then ~ 7-8 " iodoform packed back into wound and covered with sterile 4 x 4 " guaze and the by a sterile 4" x 6" Tegaderm dressing.    Assessment and Plan:  1. Abscess of back  2. Wound Care   - ROV 1 week to recheck    Follow Up Instructions:       I discussed the assessment and treatment plan with the patient. The patient was provided an opportunity to ask questions and all were answered. The patient agreed with the plan and demonstrated an understanding of the instructions.       The patient was advised to call back or seek an in-person evaluation if the symptoms worsen or if the condition fails to improve as anticipated.   Kirtland Bouchard, MD

## 2021-06-26 ENCOUNTER — Other Ambulatory Visit: Payer: Self-pay

## 2021-06-26 ENCOUNTER — Ambulatory Visit (INDEPENDENT_AMBULATORY_CARE_PROVIDER_SITE_OTHER): Payer: PPO | Admitting: Internal Medicine

## 2021-06-26 VITALS — BP 130/80 | HR 63 | Temp 97.1°F | Resp 16 | Ht 71.0 in | Wt 177.0 lb

## 2021-06-26 DIAGNOSIS — Z5189 Encounter for other specified aftercare: Secondary | ICD-10-CM

## 2021-06-26 DIAGNOSIS — L02212 Cutaneous abscess of back [any part, except buttock]: Secondary | ICD-10-CM | POA: Diagnosis not present

## 2021-06-28 ENCOUNTER — Encounter: Payer: Self-pay | Admitting: Internal Medicine

## 2021-06-28 NOTE — Progress Notes (Signed)
Future Appointments  Date Time Provider [place]  09/01/2021  2:05 PM [facility]    History of Present Illness:      Medications  Current Outpatient Medications (Endocrine & Metabolic):    fludrocortisone (FLORINEF) 0.1 MG tablet, TAKE ONE TABLET  EVERY MORNING **DO NOT HOLD**  Current Outpatient Medications (Cardiovascular):    amLODipine (NORVASC) 10 MG tablet, Take  1 tablet  Daily  (every 24 hours) if Standing Systolic BP is over 0000000   nitroGLYCERIN (NITROSTAT) 0.4 MG SL tablet, Sig: 1 tablet under tongue every 3 to 5 minutes as needed for Angina - Please Dispense # 2 bottles of # 25 tabs (Patient taking differently: Place 0.4 mg under the tongue every 5 (five) minutes as needed for chest pain.)   simvastatin (ZOCOR) 80 MG tablet, Take 1 tablet at Bedtime for Cholesterol (Patient taking differently: Take 80 mg by mouth at bedtime. for Cholesterol)   spironolactone (ALDACTONE) 25 MG tablet, TAKE ONE TABLET  EVERY MORNING FOR BLOOD PRESSURE AND HEART   Current Outpatient Medications (Analgesics):    PAIN RELIEF EXTRA STRENGTH 500 MG tablet, TAKE TWO TABLETS (1,'000MG'$ ) FOUR TIMES A DAY WITH MEALS AND BEDTIME   Current Outpatient Medications (Other):    ALPRAZolam (XANAX XR) 0.5 MG 24 hr tablet, Take  1 tablet  Daily  at Noon   cephALEXin (KEFLEX) 500 MG capsule, Take 1 capsule 4 x/day after meals & bedtime for infection   Cholecalciferol (VITAMIN D3) 2000 UNITS capsule, Take 8,000 Units by mouth daily.   erythromycin ophthalmic ointment, Place into both eyes 4 (four) times daily.   escitalopram (LEXAPRO) 10 MG tablet, TAKE ONE TABLET DAILY FOR MOOD AND ANXIETY   finasteride (PROSCAR) 5 MG tablet, Take      1 tablet       Daily       for Prostate (Patient taking differently: Take 5 mg by mouth daily. for Prostate)   glucose blood (FREESTYLE LITE) test strip, TEST BLOOD SUGAR ONCE DAILY   Lancets (FREESTYLE) lancets, 1 each by Other route  as needed for other. Use as instructed   levETIRAcetam (KEPPRA) 500 MG tablet, Take 1 tablet (500 mg total) by mouth 2 (two) times daily.   loperamide (IMODIUM) 2 MG capsule, Take 1 tablet qod - hs  - even days   Melatonin 5 MG CAPS, Take 10 mg by mouth at bedtime.    pantoprazole (PROTONIX) 40 MG tablet, TAKE ONE TABLET BY MOUTH ONCE DAILY (Patient taking differently: Take 40 mg by mouth daily.)   polyethylene glycol (MIRALAX / GLYCOLAX) 17 g packet, Take 17 g by mouth daily as needed for moderate constipation.   potassium citrate (UROCIT-K) 10 MEQ (1080 MG) SR tablet, Take 1 tablet 2 x /day to Prevent Kidney Stones (Patient taking differently: Take 10 mEq by mouth in the morning and at bedtime. to Prevent Kidney Stones)   QUEtiapine (SEROQUEL) 25 MG tablet, TAKE ONE TABLET BY MOUTH 3 TIMES A DAY BEFORE MEALS TAKE 2 TABLETS ('50MG'$ ) BY MOUTH AT BEDTIME.   tamsulosin (FLOMAX) 0.4 MG CAPS capsule, Take 1 capsule (0.4 mg total) by mouth daily after supper.   vitamin C (ASCORBIC ACID) 500 MG tablet, Take 500 mg by mouth daily.   zinc gluconate 50 MG tablet, Take 50 mg by mouth every other day.  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 ([place]); History of  TIA (transient ischemic attack); Asthma; COPD (chronic obstructive pulmonary disease) ([place]); GERD; History of colonic polyps; Gastric AV malformation; Medication management; Vitamin D deficiency; CKD stage G3a/A1, GFR 45-59 and albumin creatinine ratio <30 mg/g ([place]); Autonomic postural hypotension; Esophageal reflux; BMI 21.0-21.9, adult; Diabetic peripheral neuropathy ([place]); Insomnia; Acute medial meniscus tear of left knee; 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[place]); Adrenal insufficiency ([place]); Primary osteoarthritis of left knee; S/P left knee arthroscopy; Intracranial bleed ([place]); SAH (subarachnoid hemorrhage) ([place]); Acute conjunctivitis of both eyes; and  Non-traumatic rhabdomyolysis on their problem list.   Observations/Objective:  BP 134/78   Pulse 72   Temp 98 F (36.7 C)   Resp 14   SpO2 98%   HEENT - WNL. Neck - supple.  Chest - Clear equal BS. Cor - Nl HS. RRR  MS- FROM w/o deformities.  Gait Nl. Neuro -  Nl w/o focal abnormalities. Skin - there is a large tender sub-cutaneous abscess of the Left lateral upper back.   Procedure ( CPT [id number] )      After informed consent & aseptic prep with Alcohol, the area about the cyst was infiltrated intradermally and sub-cutaneous with ~ 6 ml of Marcaine 0.5% . Then with a # 10 scalpel,   a 1.5" transverse incision was made to expose a fibrotic cyst  ~ 1.5 x 2 " which was delivered in toto by sharp dissection. Then the wound was irrigated with H2O2 and Betadine and closed with vertical mattress proline 3-0 sutures to align & evert the the wound edges. Then ~ 10"  iodoform guaze was packed into the wound.  Wound was covered with 4" x 4" sterile guaze and covered with a 4 x 6" Tegaderm.   Assessment and Plan:  1. Essential hypertension   2. Abscess of back   Follow Up Instructions:       I discussed the assessment and treatment plan with the patient. The patient was provided an opportunity to ask questions and all were answered. The patient agreed with the plan and demonstrated an understanding of the instructions.       The patient was advised to call back or seek an in-person evaluation if the symptoms worsen or if the condition fails to improve as anticipated.   Kirtland Bouchard, MD

## 2021-06-30 ENCOUNTER — Encounter: Payer: Self-pay | Admitting: Internal Medicine

## 2021-06-30 ENCOUNTER — Other Ambulatory Visit: Payer: Self-pay

## 2021-06-30 ENCOUNTER — Ambulatory Visit (INDEPENDENT_AMBULATORY_CARE_PROVIDER_SITE_OTHER): Payer: PPO | Admitting: Internal Medicine

## 2021-06-30 VITALS — BP 114/76 | HR 82 | Temp 98.0°F | Resp 12 | Ht 71.0 in | Wt 180.0 lb

## 2021-06-30 DIAGNOSIS — S2191XS Laceration without foreign body of unspecified part of thorax, sequela: Secondary | ICD-10-CM

## 2021-06-30 DIAGNOSIS — I10 Essential (primary) hypertension: Secondary | ICD-10-CM

## 2021-06-30 DIAGNOSIS — I951 Orthostatic hypotension: Secondary | ICD-10-CM

## 2021-06-30 NOTE — Progress Notes (Signed)
Future Appointments  Date Time Provider [place]  09/01/2021  2:05 PM [facility] 1 [id number]    History of Present Illness:      Medications  Current Outpatient Medications (Endocrine & Metabolic):    fludrocortisone (FLORINEF) 0.1 MG tablet, TAKE ONE TABLET  EVERY MORNING **DO NOT HOLD**  Current Outpatient Medications (Cardiovascular):    amLODipine (NORVASC) 10 MG tablet, Take  1 tablet  Daily  (every 24 hours) if Standing Systolic BP is over 0000000   nitroGLYCERIN (NITROSTAT) 0.4 MG SL tablet, Sig: 1 tablet under tongue every 3 to 5 minutes as needed for Angina - Please Dispense # 2 bottles of # 25 tabs (Patient taking differently: Place 0.4 mg under the tongue every 5 (five) minutes as needed for chest pain.)   simvastatin (ZOCOR) 80 MG tablet, Take 1 tablet at Bedtime for Cholesterol (Patient taking differently: Take 80 mg by mouth at bedtime. for Cholesterol)   spironolactone (ALDACTONE) 25 MG tablet, TAKE ONE TABLET  EVERY MORNING FOR BLOOD PRESSURE AND HEART   Current Outpatient Medications (Analgesics):    PAIN RELIEF EXTRA STRENGTH 500 MG tablet, TAKE TWO TABLETS (1,'000MG'$ ) FOUR TIMES A DAY WITH MEALS AND BEDTIME   Current Outpatient Medications (Other):    ALPRAZolam (XANAX XR) 0.5 MG 24 hr tablet, Take  1 tablet  Daily  at Noon   cephALEXin (KEFLEX) 500 MG capsule, Take 1 capsule 4 x/day after meals & bedtime for infection   Cholecalciferol (VITAMIN D3) 2000 UNITS capsule, Take 8,000 Units by mouth daily.   erythromycin ophthalmic ointment, Place into both eyes 4 (four) times daily.   escitalopram (LEXAPRO) 10 MG tablet, TAKE ONE TABLET DAILY FOR MOOD AND ANXIETY   finasteride (PROSCAR) 5 MG tablet, Take      1 tablet       Daily       for Prostate (Patient taking differently: Take 5 mg by mouth daily. for Prostate)   glucose blood (FREESTYLE LITE) test strip, TEST BLOOD SUGAR ONCE DAILY   Lancets (FREESTYLE) lancets, 1 each by Other route  as needed for other. Use as instructed   levETIRAcetam (KEPPRA) 500 MG tablet, Take 1 tablet (500 mg total) by mouth 2 (two) times daily.   loperamide (IMODIUM) 2 MG capsule, Take 1 tablet qod - hs  - even days   Melatonin 5 MG CAPS, Take 10 mg by mouth at bedtime.    pantoprazole (PROTONIX) 40 MG tablet, TAKE ONE TABLET BY MOUTH ONCE DAILY (Patient taking differently: Take 40 mg by mouth daily.)   polyethylene glycol (MIRALAX / GLYCOLAX) 17 g packet, Take 17 g by mouth daily as needed for moderate constipation.   potassium citrate (UROCIT-K) 10 MEQ (1080 MG) SR tablet, Take 1 tablet 2 x /day to Prevent Kidney Stones (Patient taking differently: Take 10 mEq by mouth in the morning and at bedtime. to Prevent Kidney Stones)   QUEtiapine (SEROQUEL) 25 MG tablet, TAKE ONE TABLET BY MOUTH 3 TIMES A DAY BEFORE MEALS TAKE 2 TABLETS ('50MG'$ ) BY MOUTH AT BEDTIME.   tamsulosin (FLOMAX) 0.4 MG CAPS capsule, Take 1 capsule (0.4 mg total) by mouth daily after supper.   vitamin C (ASCORBIC ACID) 500 MG tablet, Take 500 mg by mouth daily.   zinc gluconate 50 MG tablet, Take 50 mg by mouth every other day.  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 ([place]); History of  TIA (transient ischemic attack); Asthma; COPD (chronic obstructive pulmonary disease) ([place]); GERD; History of colonic polyps; Gastric AV malformation; Medication management; Vitamin D deficiency; CKD stage G3a/A1, GFR 45-59 and albumin creatinine ratio <30 mg/g ([place]); Autonomic postural hypotension; Esophageal reflux; BMI 21.0-21.9, adult; Diabetic peripheral neuropathy ([place]); Insomnia; Acute medial meniscus tear of left knee; 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[place]); Adrenal insufficiency ([place]); Primary osteoarthritis of left knee; S/P left knee arthroscopy; Intracranial bleed ([place]); SAH (subarachnoid hemorrhage) ([place]); Acute conjunctivitis of both eyes; and  Non-traumatic rhabdomyolysis on their problem list.   Observations/Objective:  There were no vitals taken for this visit.  HEENT - WNL. Neck - supple.  Chest - Clear . Cor - Nl HS. RRR. No edema. MS- FROM w/o deformities.  Gait supported by a rollator.  Neuro -  Nl w/o focal abnormalities. Skin - There is an open wound of the left upper lateral back approx  3 cm transverse & 1 cm deep.   Procedure  (  CPT:  [id number]  )       After informed consent and aseptic prep with alcohol and irrigation of the wound x 3 with H2O2, the wound periphery was anesthetized locally with 5-6 ml of Marcaine 0.5% intradermally. Then the wound edges were sharply trimmed with surgical scissors & #10 scalpel. Next the edges were aligned, approximated and everted with # 5 wide vertical  mattress sutures with 3-0 Nylon. Then the wound edges were finally secured with # 4 interrupted sutures of Nylon 3-0. Sterile 4 x 4 " gauze were placed and covered with a 4" x 6 " Tegaderm.  Patient was advised suture removal in 12-14 days.   Assessment and Plan:  1. Essential hypertension  2. Autonomic postural hypotension  3. Laceration of trunk, complicated, sequela   Follow Up Instructions:       I discussed the assessment and treatment plan with the patient. The patient was provided an opportunity to ask questions and all were answered. The patient agreed with the plan and demonstrated an understanding of the instructions.       The patient was advised to call back or seek an in-person evaluation if the symptoms worsen or if the condition fails to improve as anticipated.    Kirtland Bouchard, MD

## 2021-07-06 ENCOUNTER — Other Ambulatory Visit: Payer: Self-pay | Admitting: Internal Medicine

## 2021-07-10 ENCOUNTER — Ambulatory Visit: Payer: PPO | Admitting: Internal Medicine

## 2021-07-10 ENCOUNTER — Other Ambulatory Visit: Payer: Self-pay

## 2021-07-10 DIAGNOSIS — L02212 Cutaneous abscess of back [any part, except buttock]: Secondary | ICD-10-CM

## 2021-07-10 MED ORDER — DOXYCYCLINE HYCLATE 100 MG PO CAPS: ORAL_CAPSULE | ORAL | 0 refills | Status: DC

## 2021-07-12 ENCOUNTER — Encounter: Payer: Self-pay | Admitting: Internal Medicine

## 2021-07-12 MED ORDER — DOXYCYCLINE HYCLATE 100 MG PO CAPS: ORAL_CAPSULE | ORAL | 0 refills | Status: DC

## 2021-07-12 NOTE — Progress Notes (Signed)
oooooooooooooooooooooooooooooooooooooooooooooooooooooooo  Wound on back - redressed  - Irrigated with hydrogen peroxide x 3  and sterile Dressing applied.    1. Abscess of back  - Doxycycline (VIBRAMYCIN) 100 MG capsule;  Take 2 capsules after Lunch & 1 capsule after Supper  - today, then tomorrow start 1 capsule 2 x /day  after Breakfast & Supper for Infection   Dispense: 60 capsule; Refill: 0 ooooooooooooooooooooooooooooooooooooooooooooooooooooooo  - ROV 5 days to recheck   ooooooooooooooooooooooooooooooooooooooooooooooooooooooo

## 2021-08-06 ENCOUNTER — Other Ambulatory Visit: Payer: Self-pay

## 2021-08-06 ENCOUNTER — Ambulatory Visit (INDEPENDENT_AMBULATORY_CARE_PROVIDER_SITE_OTHER): Payer: PPO

## 2021-08-06 VITALS — Temp 98.1°F

## 2021-08-06 DIAGNOSIS — Z23 Encounter for immunization: Secondary | ICD-10-CM | POA: Diagnosis not present

## 2021-08-13 ENCOUNTER — Other Ambulatory Visit: Payer: Self-pay | Admitting: Internal Medicine

## 2021-09-01 ENCOUNTER — Ambulatory Visit (HOSPITAL_COMMUNITY): Payer: PPO | Attending: Cardiology

## 2021-09-09 ENCOUNTER — Other Ambulatory Visit: Payer: Self-pay | Admitting: Internal Medicine

## 2021-10-12 ENCOUNTER — Other Ambulatory Visit: Payer: Self-pay | Admitting: Internal Medicine

## 2021-10-12 DIAGNOSIS — N401 Enlarged prostate with lower urinary tract symptoms: Secondary | ICD-10-CM

## 2021-10-14 ENCOUNTER — Other Ambulatory Visit: Payer: Self-pay | Admitting: Internal Medicine

## 2021-10-14 DIAGNOSIS — N200 Calculus of kidney: Secondary | ICD-10-CM

## 2021-10-14 MED ORDER — TIRZEPATIDE 2.5 MG/0.5ML ~~LOC~~ SOAJ: 2.5000 mg | SUBCUTANEOUS | 2 refills | Status: DC

## 2021-10-14 MED ORDER — TIRZEPATIDE 2.5 MG/0.5ML ~~LOC~~ SOAJ: SUBCUTANEOUS | 0 refills | Status: DC

## 2021-10-22 ENCOUNTER — Other Ambulatory Visit: Payer: Self-pay | Admitting: Internal Medicine

## 2021-10-22 MED ORDER — TRULICITY 1.5 MG/0.5ML ~~LOC~~ SOAJ: SUBCUTANEOUS | 1 refills | Status: DC

## 2021-11-04 ENCOUNTER — Ambulatory Visit: Payer: PPO | Admitting: Internal Medicine

## 2021-11-12 ENCOUNTER — Ambulatory Visit: Payer: PPO | Admitting: Internal Medicine

## 2021-11-25 ENCOUNTER — Ambulatory Visit (INDEPENDENT_AMBULATORY_CARE_PROVIDER_SITE_OTHER): Payer: PPO | Admitting: Internal Medicine

## 2021-11-25 ENCOUNTER — Other Ambulatory Visit: Payer: Self-pay

## 2021-11-25 ENCOUNTER — Encounter (HOSPITAL_COMMUNITY): Payer: Self-pay

## 2021-11-25 ENCOUNTER — Encounter: Payer: Self-pay | Admitting: Internal Medicine

## 2021-11-25 ENCOUNTER — Inpatient Hospital Stay (HOSPITAL_COMMUNITY)
Admission: EM | Admit: 2021-11-25 | Discharge: 2021-12-03 | DRG: 179 | Disposition: A | Discharge status: Skilled Nursing Facility | Payer: PPO | Source: Skilled Nursing Facility | Attending: Internal Medicine | Admitting: Internal Medicine

## 2021-11-25 ENCOUNTER — Emergency Department (HOSPITAL_COMMUNITY): Payer: PPO

## 2021-11-25 ENCOUNTER — Other Ambulatory Visit: Payer: Self-pay | Admitting: Internal Medicine

## 2021-11-25 VITALS — BP 100/77 | HR 107 | Temp 97.8°F | Resp 17

## 2021-11-25 DIAGNOSIS — E860 Dehydration: Secondary | ICD-10-CM | POA: Diagnosis present

## 2021-11-25 DIAGNOSIS — R569 Unspecified convulsions: Secondary | ICD-10-CM | POA: Diagnosis not present

## 2021-11-25 DIAGNOSIS — Z87891 Personal history of nicotine dependence: Secondary | ICD-10-CM

## 2021-11-25 DIAGNOSIS — K219 Gastro-esophageal reflux disease without esophagitis: Secondary | ICD-10-CM | POA: Diagnosis present

## 2021-11-25 DIAGNOSIS — Z79899 Other long term (current) drug therapy: Secondary | ICD-10-CM

## 2021-11-25 DIAGNOSIS — I129 Hypertensive chronic kidney disease with stage 1 through stage 4 chronic kidney disease, or unspecified chronic kidney disease: Secondary | ICD-10-CM | POA: Diagnosis present

## 2021-11-25 DIAGNOSIS — F5105 Insomnia due to other mental disorder: Secondary | ICD-10-CM | POA: Diagnosis present

## 2021-11-25 DIAGNOSIS — R0602 Shortness of breath: Secondary | ICD-10-CM

## 2021-11-25 DIAGNOSIS — R55 Syncope and collapse: Secondary | ICD-10-CM | POA: Diagnosis not present

## 2021-11-25 DIAGNOSIS — Z951 Presence of aortocoronary bypass graft: Secondary | ICD-10-CM

## 2021-11-25 DIAGNOSIS — Z8673 Personal history of transient ischemic attack (TIA), and cerebral infarction without residual deficits: Secondary | ICD-10-CM

## 2021-11-25 DIAGNOSIS — G4730 Sleep apnea, unspecified: Secondary | ICD-10-CM | POA: Diagnosis present

## 2021-11-25 DIAGNOSIS — F32A Depression, unspecified: Secondary | ICD-10-CM | POA: Diagnosis present

## 2021-11-25 DIAGNOSIS — N1831 Chronic kidney disease, stage 3a: Secondary | ICD-10-CM | POA: Diagnosis present

## 2021-11-25 DIAGNOSIS — J449 Chronic obstructive pulmonary disease, unspecified: Secondary | ICD-10-CM | POA: Diagnosis present

## 2021-11-25 DIAGNOSIS — E1122 Type 2 diabetes mellitus with diabetic chronic kidney disease: Secondary | ICD-10-CM | POA: Diagnosis present

## 2021-11-25 DIAGNOSIS — G4489 Other headache syndrome: Secondary | ICD-10-CM | POA: Diagnosis not present

## 2021-11-25 DIAGNOSIS — Z833 Family history of diabetes mellitus: Secondary | ICD-10-CM | POA: Diagnosis not present

## 2021-11-25 DIAGNOSIS — Z888 Allergy status to other drugs, medicaments and biological substances status: Secondary | ICD-10-CM | POA: Diagnosis not present

## 2021-11-25 DIAGNOSIS — G4483 Primary cough headache: Secondary | ICD-10-CM

## 2021-11-25 DIAGNOSIS — E785 Hyperlipidemia, unspecified: Secondary | ICD-10-CM | POA: Diagnosis present

## 2021-11-25 DIAGNOSIS — Z8249 Family history of ischemic heart disease and other diseases of the circulatory system: Secondary | ICD-10-CM

## 2021-11-25 DIAGNOSIS — I951 Orthostatic hypotension: Secondary | ICD-10-CM | POA: Diagnosis present

## 2021-11-25 DIAGNOSIS — I2510 Atherosclerotic heart disease of native coronary artery without angina pectoris: Secondary | ICD-10-CM | POA: Diagnosis present

## 2021-11-25 DIAGNOSIS — F418 Other specified anxiety disorders: Secondary | ICD-10-CM | POA: Diagnosis present

## 2021-11-25 DIAGNOSIS — F419 Anxiety disorder, unspecified: Secondary | ICD-10-CM | POA: Diagnosis present

## 2021-11-25 DIAGNOSIS — G20 Parkinson's disease: Secondary | ICD-10-CM | POA: Diagnosis present

## 2021-11-25 DIAGNOSIS — I959 Hypotension, unspecified: Secondary | ICD-10-CM | POA: Diagnosis not present

## 2021-11-25 DIAGNOSIS — U071 COVID-19: Principal | ICD-10-CM | POA: Diagnosis present

## 2021-11-25 DIAGNOSIS — K449 Diaphragmatic hernia without obstruction or gangrene: Secondary | ICD-10-CM | POA: Diagnosis not present

## 2021-11-25 DIAGNOSIS — Z823 Family history of stroke: Secondary | ICD-10-CM

## 2021-11-25 DIAGNOSIS — R531 Weakness: Secondary | ICD-10-CM | POA: Diagnosis not present

## 2021-11-25 DIAGNOSIS — R4182 Altered mental status, unspecified: Secondary | ICD-10-CM | POA: Diagnosis not present

## 2021-11-25 DIAGNOSIS — Z8679 Personal history of other diseases of the circulatory system: Secondary | ICD-10-CM

## 2021-11-25 LAB — CBC WITH DIFFERENTIAL/PLATELET
Abs Immature Granulocytes: 0.03 10*3/uL (ref 0.00–0.07)
Basophils Absolute: 0 10*3/uL (ref 0.0–0.1)
Basophils Relative: 0 %
Eosinophils Absolute: 0 10*3/uL (ref 0.0–0.5)
Eosinophils Relative: 0 %
HCT: 37.5 % — ABNORMAL LOW (ref 39.0–52.0)
Hemoglobin: 12.8 g/dL — ABNORMAL LOW (ref 13.0–17.0)
Immature Granulocytes: 1 %
Lymphocytes Relative: 13 %
Lymphs Abs: 0.7 10*3/uL (ref 0.7–4.0)
MCH: 34.5 pg — ABNORMAL HIGH (ref 26.0–34.0)
MCHC: 34.1 g/dL (ref 30.0–36.0)
MCV: 101.1 fL — ABNORMAL HIGH (ref 80.0–100.0)
Monocytes Absolute: 0.8 10*3/uL (ref 0.1–1.0)
Monocytes Relative: 15 %
Neutro Abs: 4 10*3/uL (ref 1.7–7.7)
Neutrophils Relative %: 71 %
Platelets: 156 10*3/uL (ref 150–400)
RBC: 3.71 MIL/uL — ABNORMAL LOW (ref 4.22–5.81)
RDW: 12.4 % (ref 11.5–15.5)
WBC: 5.6 10*3/uL (ref 4.0–10.5)
nRBC: 0 % (ref 0.0–0.2)

## 2021-11-25 LAB — I-STAT CHEM 8, ED
BUN: 14 mg/dL (ref 8–23)
Calcium, Ion: 1.15 mmol/L (ref 1.15–1.40)
Chloride: 101 mmol/L (ref 98–111)
Creatinine, Ser: 1.5 mg/dL — ABNORMAL HIGH (ref 0.61–1.24)
Glucose, Bld: 84 mg/dL (ref 70–99)
HCT: 38 % — ABNORMAL LOW (ref 39.0–52.0)
Hemoglobin: 12.9 g/dL — ABNORMAL LOW (ref 13.0–17.0)
Potassium: 3.5 mmol/L (ref 3.5–5.1)
Sodium: 138 mmol/L (ref 135–145)
TCO2: 27 mmol/L (ref 22–32)

## 2021-11-25 LAB — RESP PANEL BY RT-PCR (FLU A&B, COVID) ARPGX2
Influenza A by PCR: NEGATIVE
Influenza B by PCR: NEGATIVE
SARS Coronavirus 2 by RT PCR: POSITIVE — AB

## 2021-11-25 LAB — LACTIC ACID, PLASMA
Lactic Acid, Venous: 2.2 mmol/L (ref 0.5–1.9)
Lactic Acid, Venous: 1.1 mmol/L (ref 0.5–1.9)

## 2021-11-25 LAB — COMPREHENSIVE METABOLIC PANEL
ALT: 15 U/L (ref 0–44)
AST: 21 U/L (ref 15–41)
Albumin: 3.6 g/dL (ref 3.5–5.0)
Alkaline Phosphatase: 66 U/L (ref 38–126)
Anion gap: 7 (ref 5–15)
BUN: 16 mg/dL (ref 8–23)
CO2: 26 mmol/L (ref 22–32)
Calcium: 8.3 mg/dL — ABNORMAL LOW (ref 8.9–10.3)
Chloride: 104 mmol/L (ref 98–111)
Creatinine, Ser: 1.41 mg/dL — ABNORMAL HIGH (ref 0.61–1.24)
GFR, Estimated: 49 mL/min — ABNORMAL LOW (ref >60)
Glucose, Bld: 89 mg/dL (ref 70–99)
Potassium: 3.5 mmol/L (ref 3.5–5.1)
Sodium: 137 mmol/L (ref 135–145)
Total Bilirubin: 0.9 mg/dL (ref 0.3–1.2)
Total Protein: 6.6 g/dL (ref 6.5–8.1)

## 2021-11-25 LAB — POC COVID19 BINAXNOW: SARS Coronavirus 2 Ag: POSITIVE — AB

## 2021-11-25 LAB — POC INFLUENZA A&B (BINAX/QUICKVUE)
Influenza A, POC: NEGATIVE
Influenza B, POC: NEGATIVE

## 2021-11-25 LAB — C-REACTIVE PROTEIN: CRP: 3.1 mg/dL — ABNORMAL HIGH (ref <1.0)

## 2021-11-25 LAB — D-DIMER, QUANTITATIVE: D-Dimer, Quant: 0.3 ug/mL-FEU (ref 0.00–0.50)

## 2021-11-25 LAB — FIBRINOGEN: Fibrinogen: 338 mg/dL (ref 210–475)

## 2021-11-25 LAB — LACTATE DEHYDROGENASE: LDH: 125 U/L (ref 98–192)

## 2021-11-25 LAB — FERRITIN: Ferritin: 36 ng/mL (ref 24–336)

## 2021-11-25 LAB — TRIGLYCERIDES: Triglycerides: 32 mg/dL (ref <150)

## 2021-11-25 LAB — PROCALCITONIN: Procalcitonin: <0.1 ng/mL

## 2021-11-25 MED ORDER — ALPRAZOLAM ER 0.5 MG PO TB24
0.5000 mg | ORAL_TABLET | Freq: Every day | ORAL | Status: DC
  Administered 2021-11-26 – 2021-12-03 (×8): 0.5 mg via ORAL
  Filled 2021-11-25 (×8): qty 1

## 2021-11-25 MED ORDER — MOLNUPIRAVIR EUA 200MG CAPSULE
4.0000 | ORAL_CAPSULE | Freq: Two times a day (BID) | ORAL | Status: DC
Start: 2021-11-25 — End: 2021-11-25

## 2021-11-25 MED ORDER — ONDANSETRON HCL 4 MG PO TABS: 4.0000 mg | ORAL_TABLET | Freq: Four times a day (QID) | ORAL | Status: DC | PRN

## 2021-11-25 MED ORDER — FINASTERIDE 5 MG PO TABS
5.0000 mg | ORAL_TABLET | Freq: Every day | ORAL | Status: DC
  Administered 2021-11-26 – 2021-12-03 (×8): 5 mg via ORAL
  Filled 2021-11-25 (×8): qty 1

## 2021-11-25 MED ORDER — SENNOSIDES-DOCUSATE SODIUM 8.6-50 MG PO TABS: 1.0000 | ORAL_TABLET | Freq: Every evening | ORAL | Status: DC | PRN

## 2021-11-25 MED ORDER — MELATONIN 5 MG PO TABS
10.0000 mg | ORAL_TABLET | Freq: Every day | ORAL | Status: DC
  Administered 2021-11-26 – 2021-12-02 (×9): 10 mg via ORAL
  Filled 2021-11-25 (×8): qty 2

## 2021-11-25 MED ORDER — SODIUM CHLORIDE 0.9% FLUSH
3.0000 mL | Freq: Two times a day (BID) | INTRAVENOUS | Status: DC
  Administered 2021-11-25 – 2021-12-03 (×15): 3 mL via INTRAVENOUS

## 2021-11-25 MED ORDER — LACTATED RINGERS IV SOLN: INTRAVENOUS | Status: DC

## 2021-11-25 MED ORDER — ONDANSETRON HCL 4 MG/2ML IJ SOLN
4.0000 mg | Freq: Four times a day (QID) | INTRAMUSCULAR | Status: DC | PRN
  Filled 2021-11-25: qty 2

## 2021-11-25 MED ORDER — ATORVASTATIN CALCIUM 40 MG PO TABS
40.0000 mg | ORAL_TABLET | Freq: Every day | ORAL | Status: DC
  Administered 2021-11-26 – 2021-12-02 (×7): 40 mg via ORAL
  Filled 2021-11-25 (×7): qty 1

## 2021-11-25 MED ORDER — DIPHENHYDRAMINE HCL 50 MG/ML IJ SOLN
25.0000 mg | Freq: Once | INTRAMUSCULAR | Status: AC
  Administered 2021-11-25: 25 mg via INTRAVENOUS
  Filled 2021-11-25: qty 1

## 2021-11-25 MED ORDER — NIRMATRELVIR/RITONAVIR (PAXLOVID) TABLET (RENAL DOSING)
2.0000 | ORAL_TABLET | Freq: Two times a day (BID) | ORAL | Status: DC
Start: 2021-11-25 — End: 2021-11-25

## 2021-11-25 MED ORDER — TAMSULOSIN HCL 0.4 MG PO CAPS
0.4000 mg | ORAL_CAPSULE | Freq: Every day | ORAL | Status: DC
  Administered 2021-11-26 – 2021-12-03 (×8): 0.4 mg via ORAL
  Filled 2021-11-25 (×8): qty 1

## 2021-11-25 MED ORDER — ENOXAPARIN SODIUM 40 MG/0.4ML IJ SOSY
40.0000 mg | PREFILLED_SYRINGE | INTRAMUSCULAR | Status: DC
  Administered 2021-11-25 – 2021-12-02 (×8): 40 mg via SUBCUTANEOUS
  Filled 2021-11-25 (×8): qty 0.4

## 2021-11-25 MED ORDER — SODIUM CHLORIDE 0.9 % IV SOLN
100.0000 mg | Freq: Every day | INTRAVENOUS | Status: AC
  Administered 2021-11-26 – 2021-11-29 (×4): 100 mg via INTRAVENOUS
  Filled 2021-11-25 (×4): qty 20

## 2021-11-25 MED ORDER — METOCLOPRAMIDE HCL 5 MG/ML IJ SOLN
10.0000 mg | Freq: Once | INTRAMUSCULAR | Status: AC
  Administered 2021-11-25: 10 mg via INTRAVENOUS
  Filled 2021-11-25: qty 2

## 2021-11-25 MED ORDER — SODIUM CHLORIDE 0.9 % IV SOLN
200.0000 mg | Freq: Once | INTRAVENOUS | Status: AC
  Administered 2021-11-25: 200 mg via INTRAVENOUS
  Filled 2021-11-25: qty 40

## 2021-11-25 MED ORDER — QUETIAPINE FUMARATE 25 MG PO TABS
25.0000 mg | ORAL_TABLET | Freq: Three times a day (TID) | ORAL | Status: DC
  Administered 2021-11-26 – 2021-12-03 (×23): 25 mg via ORAL
  Filled 2021-11-25 (×23): qty 1

## 2021-11-25 MED ORDER — SODIUM CHLORIDE 0.9 % IV BOLUS
1000.0000 mL | Freq: Once | INTRAVENOUS | Status: AC
  Administered 2021-11-25: 1000 mL via INTRAVENOUS

## 2021-11-25 MED ORDER — LEVETIRACETAM 500 MG PO TABS
500.0000 mg | ORAL_TABLET | Freq: Two times a day (BID) | ORAL | Status: DC
  Administered 2021-11-26 – 2021-12-03 (×15): 500 mg via ORAL
  Filled 2021-11-25 (×15): qty 1

## 2021-11-25 MED ORDER — ACETAMINOPHEN 325 MG PO TABS
650.0000 mg | ORAL_TABLET | Freq: Four times a day (QID) | ORAL | Status: DC | PRN
  Administered 2021-11-25 – 2021-11-26 (×2): 650 mg via ORAL
  Filled 2021-11-25 (×2): qty 2

## 2021-11-25 MED ORDER — FLUDROCORTISONE ACETATE 0.1 MG PO TABS
0.1000 mg | ORAL_TABLET | Freq: Two times a day (BID) | ORAL | Status: DC
  Administered 2021-11-26 – 2021-11-27 (×4): 0.1 mg via ORAL
  Filled 2021-11-25 (×5): qty 1

## 2021-11-25 MED ORDER — PANTOPRAZOLE SODIUM 40 MG PO TBEC
40.0000 mg | DELAYED_RELEASE_TABLET | Freq: Every day | ORAL | Status: DC
  Administered 2021-11-26 – 2021-12-03 (×8): 40 mg via ORAL
  Filled 2021-11-25 (×8): qty 1

## 2021-11-25 MED ORDER — ESCITALOPRAM OXALATE 10 MG PO TABS
10.0000 mg | ORAL_TABLET | Freq: Every day | ORAL | Status: DC
  Administered 2021-11-26 – 2021-12-03 (×8): 10 mg via ORAL
  Filled 2021-11-25 (×8): qty 1

## 2021-11-25 MED ORDER — ACETAMINOPHEN 650 MG RE SUPP: 650.0000 mg | Freq: Four times a day (QID) | RECTAL | Status: DC | PRN

## 2021-11-25 NOTE — H&P (Signed)
History and Physical    John [name] TIR:[id number] DOB: 17-May-1935 DOA: 11/25/2021  PCP: Unk Pinto, MD  Patient coming from: Assisted living facility  I have personally briefly reviewed patient's old medical records in [place]  Chief Complaint: Weakness, near syncope  HPI: John [name] is a 87 y.o. male with medical history significant for CAD s/p CABG, hypertension, CKD stage IIIa, autonomic postural hypotension on Florinef, history of intracranial hemorrhage, PUD, depression/anxiety who presented to the ED from his PCP office for evaluation of near syncope with hypotension.  Patient reports 1 day of poor oral intake, headache, generalized weakness, and lightheadedness/dizziness.  He states that when he stands he becomes easily dizzy, described as a lightheaded/off-balance sensation without room spinning.  He has had associated chills and diaphoresis recently.  He states he normally ambulates with the use of a walker but has been unable to do so since symptoms began.  He went to his PCPs office on 2/1 and was noted to be hypotensive with systolic blood pressure in the 70s.  He had a witnessed episode of transient apparent loss of consciousness. EMS were called and patient was transported to the ED for further evaluation.  COVID-19 antigen test obtained in office was positive.   ED Course I have personally reviewed following labs and imaging studies:  Initial vitals show BP 137/73, pulse 80, RR 18, temp 90.5 F, SPO2 98% on room air.  Labs show WBC 5.6, hemoglobin 12.8, platelets 156,000, sodium 137, potassium 3.5, bicarb 26, BUN 16, creatinine 1.41, serum glucose 89, LFTs within normal limits, D-dimer 0.30, lactic acid 2.2, ferritin 36, LDH 125.  SARS-CoV-2 PCR is positive.  Influenza a and B are negative.  Blood cultures collected and pending.  Portable chest x-ray negative for focal consolidation, edema, effusion.  CT head without contrast negative for acute  intracranial abnormality.  Patient was given 1 L normal saline, Reglan, and started on IV remdesivir.  Patient noted to become tachycardic to 120s when stood up, unable to stand or ambulate on her own.  The hospitalist service was consulted to admit for further evaluation and management.  Review of Systems: All systems reviewed and are negative except as documented in history of present illness above.   Past Medical History:  Diagnosis Date   Anxiety    Arthritis    "lower back; left ankle" (03/20/2014)   ASTHMA    Blood transfusion    "related to OHS, ulcers"   CAD (coronary artery disease)    a. s/p 2V CABG 1996. b. Last cath 12/2016 -> patent grafts, med rx.   CAD, ARTERY BYPASS GRAFT    Cataract    CKD (chronic kidney disease), stage II    Colon polyps    adenomatous polyps   COPD    Depression    Diabetes mellitus without complication (HCC)    no meds now   Diverticulosis    Duodenal ulcer without hemorrhage or perforation 12/29/2011   GASTROESOPHAGEAL REFLUX DISEASE    HYPERLIPIDEMIA    Insomnia    Leukodystrophy (HCC)    Memory loss    Nephrolithiasis    "I've had over 320 kidney stones" (03/20/2014)   NEPHROLITHIASIS, HX OF 03/12/2009   Parkinsonism (HCC)    Sleep apnea    does not use CPAP   Stroke ([place])    TIA   SUPRAVENTRICULAR TACHYCARDIA, HX OF    SVT (supraventricular tachycardia) ([place]) 03/12/2009   TIA    Ulcer  Past Surgical History:  Procedure Laterality Date   ANKLE FRACTURE SURGERY Left ~ 2012   CARDIAC CATHETERIZATION     COLONOSCOPY     CORONARY ARTERY BYPASS GRAFT  1996   CABG X2   CYSTOSCOPY WITH URETEROSCOPY, STONE BASKETRY AND STENT PLACEMENT     Cystourethroscopy with stent removal.     EXCISIONAL HEMORRHOIDECTOMY     GASTRECTOMY     HIATAL HERNIA REPAIR     IRRIGATION AND DEBRIDEMENT SEBACEOUS CYST     "off my back   KNEE ARTHROSCOPY WITH MEDIAL MENISECTOMY Left 08/24/2019   Procedure: LEFT KNEE ARTHROSCOPY WITH PARTIAL MEDIAL  MENISCECTOMY;  Surgeon: Leandrew Koyanagi, MD;  Location: [facility];  Service: Orthopedics;  Laterality: Left;   LEFT HEART CATH AND CORS/GRAFTS ANGIOGRAPHY N/A 12/23/2016   Procedure: Left Heart Cath and Cors/Grafts Angiography;  Surgeon: Burnell Blanks, MD;  Location: [facility];  Service: Cardiovascular;  Laterality: N/A;   POLYPECTOMY     PROSTATE SURGERY     "took the center of my prostate out"   UPPER GASTROINTESTINAL ENDOSCOPY     VAGOTOMY      Social History:  reports that he quit smoking about 59 years ago. His smoking use included cigarettes. He smoked an average of .5 packs per day. He quit smokeless tobacco use about 3 years ago.  His smokeless tobacco use included chew. He reports that he does not drink alcohol and does not use drugs.  Allergies  Allergen Reactions   Gabapentin Other (See Comments)    Severe confusion   Prozac [Fluoxetine Hcl] Other (See Comments)    Patient state that it does not agree with him   Soma [Carisoprodol] Other (See Comments)    Patient stated that it does not agree with him   Beta Adrenergic Blockers Other (See Comments)    REACTION: Bradycardia    Family History  Problem Relation Age of Onset   Alcohol abuse Father    Stroke Mother    Heart disease Brother    Diabetes Brother    Heart disease Paternal Grandmother    Colon cancer Neg Hx    Stomach cancer Neg Hx    Esophageal cancer Neg Hx    Rectal cancer Neg Hx      Prior to Admission medications   Medication Sig Start Date End Date Taking? Authorizing Provider  acetaminophen (PAIN RELIEF EXTRA STRENGTH) 500 MG tablet Take  2 tablets  4 x /day  with Meals & Bedtime for Pain 10/12/21   Unk Pinto, MD  ALPRAZolam (ALPRAZOLAM XR) 0.5 MG 24 hr tablet TAKE ONE TABLET BY MOUTH DAILY AT Gwendolyn Fill 10/12/21   Unk Pinto, MD  amLODipine (NORVASC) 10 MG tablet Take  1 tablet  Daily  (every 24 hours) if Standing Systolic BP is over 161 0/96/04   Unk Pinto, MD  Cholecalciferol (VITAMIN D3) 2000 UNITS capsule Take 8,000 Units by mouth daily.    [provider]  Cholecalciferol 50 MCG (2000 UT) TABS Take 1  capsule  Daily 10/12/21   Unk Pinto, MD  erythromycin ophthalmic ointment Place into both eyes 4 (four) times daily. 01/19/21   Hosie Poisson, MD  escitalopram (LEXAPRO) 10 MG tablet TAKE ONE TABLET DAILY FOR MOOD AND ANXIETY 06/09/21   Unk Pinto, MD  finasteride (PROSCAR) 5 MG tablet Take  1 tablet  Daily  for Prostate 10/12/21   Unk Pinto, MD  fludrocortisone (FLORINEF) 0.1 MG tablet TAKE ONE TABLET  EVERY MORNING **DO NOT HOLD** 06/09/21   Unk Pinto, MD  glucose blood (FREESTYLE LITE) test strip TEST BLOOD SUGAR ONCE DAILY 06/01/19   Unk Pinto, MD  Lancets (FREESTYLE) lancets 1 each by Other route as needed for other. Use as instructed    [provider]  levETIRAcetam (KEPPRA) 500 MG tablet Take 1 tablet (500 mg total) by mouth 2 (two) times daily. Take  1 tablet  2 x /day  with Breakfast & Supper to Prevent Seizures 09/09/21   Unk Pinto, MD  loperamide (IMODIUM) 2 MG capsule Take 1 tablet qod - hs  - even days 01/30/21   Unk Pinto, MD  Melatonin 5 MG CAPS Take 10 mg by mouth at bedtime.     [provider]  nitroGLYCERIN (NITROSTAT) 0.4 MG SL tablet Sig: 1 tablet under tongue every 3 to 5 minutes as needed for Angina - Please Dispense # 2 bottles of # 25 tabs Patient taking differently: Place 0.4 mg under the tongue every 5 (five) minutes as needed for chest pain. 07/17/18   Unk Pinto, MD  pantoprazole (PROTONIX) 40 MG tablet Take  1 tablet  Daily  to Prevent Heartburn & Indigestion 10/12/21   Unk Pinto, MD  polyethylene glycol (MIRALAX / GLYCOLAX) 17 g packet Take 17 g by mouth daily as needed for moderate constipation. 01/19/21   Hosie Poisson, MD  potassium citrate (UROCIT-K) 10 MEQ (1080 MG) SR tablet TAKE ONE TABLET BY MOUTH 2 TIMES A DAY TO PREVENT  KIDNEY STONES 10/14/21   Magda Bernheim, NP  QUEtiapine (SEROQUEL) 25 MG tablet TAKE ONE TABLET BY MOUTH 3 TIMES A DAY BEFORE MEALS TAKE 2 TABLETS (50MG ) BY MOUTH AT BEDTIME. 07/06/21   Unk Pinto, MD  simvastatin (ZOCOR) 80 MG tablet Take 1 tablet at Bedtime for Cholesterol Patient taking differently: Take 80 mg by mouth at bedtime. for Cholesterol 10/01/19   Unk Pinto, MD  spironolactone (ALDACTONE) 25 MG tablet Take  1 tablet  every Morning for BP & Heart 10/12/21   Unk Pinto, MD  tamsulosin [facility]) 0.4 MG CAPS capsule Take 1 capsule Daily for Prosate 09/09/21   Unk Pinto, MD  vitamin C (ASCORBIC ACID) 500 MG tablet Take 1 tablet Daily  10/14/21   Unk Pinto, MD  zinc gluconate 50 MG tablet Take 50 mg by mouth every other day.    [provider]    Physical Exam: Vitals:   11/25/21 1945 11/25/21 2000 11/25/21 2015 11/25/21 2057  BP: (!) 161/89 (!) 155/79  (!) 176/85  Pulse: 93 93  86  Resp:   (!) 23 (!) 24  Temp:    99.7 F (37.6 C)  TempSrc:    Oral  SpO2: 99% 96%  97%  Weight:       Constitutional: Resting supine in bed NAD, calm, comfortable Eyes: PERRL, lids and conjunctivae normal ENMT: Mucous membranes are dry. Posterior pharynx clear of any exudate or lesions.Normal dentition.  Neck: normal, supple, no masses. Respiratory: clear to auscultation bilaterally, no wheezing, no crackles. Normal respiratory effort. No accessory muscle use.  Cardiovascular: Regular rate and rhythm, no murmurs / rubs / gallops. No extremity edema. 2+ pedal pulses. Abdomen: no tenderness, no masses palpated. No hepatosplenomegaly. Bowel sounds positive.  Musculoskeletal: no clubbing / cyanosis. No joint deformity upper and lower extremities. Good ROM, no contractures. Normal muscle tone.  Skin: no rashes, lesions, ulcers. No induration Neurologic: CN 2-12 grossly intact. Sensation intact. Strength 5/5 in all 4.  Psychiatric:  Normal judgment and insight. Alert  and oriented x 3. Normal mood.    EKG: Personally reviewed. Normal sinus rhythm with PVCs, first-degree AV block.  PVCs new when compared to prior.  Assessment/Plan Principal Problem:   COVID-19 virus infection Active Problems:   Near syncope   Autonomic postural hypotension   CAD (coronary artery disease)   CKD stage G3a/A1, GFR 45-59 and albumin creatinine ratio <30 mg/g (HCC)   History of intracranial hemorrhage   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 anxiety   John [name] is a 87 y.o. 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  Assessment and Plan: * COVID-19 virus infection- (present on admission) SARS-CoV-2 positive 04/26/7105 complicated by dehydration with hypotension and near syncope as well as generalized weakness.  Saturating well on room air without evidence of pneumonia/infiltrate on CXR. -Started on IV remdesivir, continue  Near syncope- (present on admission) Secondary to hypotension/dehydration due to poor oral intake in setting of COVID-19 viral infection. -Continue IV fluid hydration -PT/OT eval  Autonomic postural hypotension- (present on admission) Resume home Florinef 0.1 mg twice daily.  Hold amlodipine and spironolactone given worsening hypotension at rest.  CAD (coronary artery disease)- (present on admission) Stable, denies any chest pain.  Continue statin.  CKD stage G3a/A1, GFR 45-59 and albumin creatinine ratio <30 mg/g (HCC)- (present on admission) Creatinine is slightly diminished compared to recent baseline.  Continue IV fluid hydration and repeat labs in AM.  Holding spironolactone as above.  History of intracranial hemorrhage Continue home Keppra for seizure prophylaxis. CT head  without contrast on admission negative for acute intracranial abnormality.  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 anxiety- (present on admission) Continue home Lexapro, Seroquel, alprazolam.  DVT prophylaxis: enoxaparin (LOVENOX) injection 40 mg Start: 11/25/21 2300 Code Status: Full code, confirmed with patient on admission Family Communication: Discussed with patient, he has discussed with family Disposition Plan: From ALF, dispo pending clinical progress Consults called: None Severity of Illness: The appropriate patient status for this patient is OBSERVATION. Observation status is judged to be reasonable and necessary in order to provide the required intensity of service to ensure the patient's safety. The patient's presenting symptoms, physical exam findings, and initial radiographic and laboratory data in the context of their medical condition is felt to place them at decreased risk for further clinical deterioration. Furthermore, it is anticipated that the patient will be medically stable for discharge from the hospital within 2 midnights of admission.   Zada Finders MD [facility]  If 7PM-7AM, please contact night-coverage [email or web address]  11/26/2021, 12:28 AM

## 2021-11-25 NOTE — ED Triage Notes (Signed)
Pt BIB via EMS from doctor office due to havign a near syncopal episode. Pt tested positive for COVID today. Pt c/o fatigue, decreased appetite, and headache. Pt was hypotensive on EMS arrival and was given 500cc of NS. Pt has hx of dementia.

## 2021-11-25 NOTE — Progress Notes (Signed)
History of Present Illness:                                             Patient is a very nice 87 yo WWM with HTN, ASCAD, Vascular Dementia, latent DM who was hospitalized March 2022  with a SAH & multiple Frontal lobe hemorrhages /Hematomas. Since then,  he has been residing at [facility]. Today he was brought in by his family for evaluation for noted lethargy and inability to ambulate. Baseline gait with a rollator had been unstable. Covid test  today is (+) Positive.  Exam was non focal with general lassitude.   Medications    fludrocortisone (FLORINEF) 0.1 MG tablet, TAKE ONE TABLET  EVERY MORNING    amLODipine (NORVASC) 10 MG tablet, Take  1 tablet  Daily  (every 24 hours) if Standing Systolic BP is over 841   NITROSTAT) 0.4 MG SL tablet, Sig: 1 tablet under tongue every 3 to 5 minutes as needed for Angina    simvastatin  80 MG tablet, Take 1 tablet at Bedtime for Cholesterol    spironolactone (ALDACTONE) 25 MG tablet, Take  1 tablet  every Morning    acetaminophen 500 MG tablet, Take  2 tablets  4 x /day  with Meals & Bedtime    ALPRAZolam (ALPRAZOLAM XR) 0.5 MG 24 hr tablet, TAKE ONE TABLET BY MOUTH DAILY AT NOON   VITAMIN D 2000 UNITS capsule, Take 8,000 Units by mouth daily.   Cholecalciferol 2000 u , Take 1  capsule  Daily   escitalopram  10 MG tablet, TAKE ONE TABLET DAILY FOR MOOD AND ANXIETY   finasteride  5 MG tablet, Take  1 tablet  Daily  for Prostate   glucose blood (FREESTYLE LITE) test strip, TEST BLOOD SUGAR ONCE DAILY   Lancets (FREESTYLE) lancets, 1 each by Other route as needed for other. Use as instructed   levETIRAcetam (KEPPRA) 500 MG tablet, Take 1 tablet (500 mg total) by mouth 2 (two) times daily. Take  1 tablet  2 x /day  with Breakfast & Supper to Prevent Seizures   loperamide (IMODIUM) 2 MG capsule, Take 1 tablet qod - hs  - even days   Melatonin 5 MG CAPS, Take 10 mg by mouth at bedtime.    pantoprazole (PROTONIX) 40 MG tablet, Take  1 tablet  Daily   to Prevent Heartburn & Indigestion   polyethylene glycol (MIRALAX / GLYCOLAX) 17 g packet, Take 17 g by mouth daily as needed for moderate constipation.   potassium citrate (UROCIT-K) 10 MEQ (1080 MG) SR tablet, TAKE ONE TABLET BY MOUTH 2 TIMES A DAY TO PREVENT KIDNEY STONES   QUEtiapine 25 MG tablet, TAKE ONE TABLET  3 TIMES A DAY BEFORE MEALS TAKE 2 TABLETSAT BEDTIME.   tamsulosin 0.4 MG CAPS capsule, Take 1 capsule Daily for Prosate   vitamin C (ASCORBIC ACID) 500 MG tablet, Take 1 tablet Daily    zinc gluconate 50 MG tablet, Take 50 mg by mouth every other day. 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 ([place]); History of TIA (transient ischemic attack); Asthma; COPD (chronic obstructive pulmonary disease) ([place]); GERD; History of colonic polyps; Gastric AV malformation; Medication management; Vitamin D deficiency; CKD stage G3a/A1, GFR 45-59 and albumin creatinine ratio <30 mg/g ([place]);  Autonomic postural hypotension; Esophageal reflux; BMI 21.0-21.9, adult; Diabetic peripheral neuropathy ([place]); 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 anxiety; Acute medial meniscus tear of left knee; 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[place]); Adrenal insufficiency ([place]); Primary osteoarthritis of left knee; S/P left knee arthroscopy; Intracranial bleed ([place]); SAH (subarachnoid hemorrhage) ([place]); Non-traumatic rhabdomyolysis; COVID-19 virus infection; History of intracranial hemorrhage; Near syncope; and CAD (coronary artery disease) on their problem list.   Observations/Objective:  BP 100/77    Pulse (!) 107    Temp 97.8 F (36.6 C)    Resp 17    SpO2 96%   Postural sitting     BP 98/64      P 68        & Standing     BP 80/40    P 100  HEENT - WNL. Neck - supple.  Chest - Clear equal BS. Cor - Nl HS. RRR w/o sig MGR. PP 1(+). No edema. MS- FROM w/o deformities.  Gait Nl. Neuro -  lethargic w/o focal abnormalities.                                         While sitting in a wheelchair, patient had what appeared as a syncopal episode with transient apparent loss of consciousness becoming hyperextended with "eyes rolling upward" . Patient was not felt safe for return to an [facility] & in need of further evaluation & IV fluids. 911 was contacted to emergent transport to the ER   Assessment and Plan:  1. Seizure-like activity ([place])  2. Pre-syncope  3. Postural hypotension  4. Dehydration  5. Cough headache  - POC COVID-19 - POC Influenza A&B (Binax test)   Patient transporting to [facility] ER.     Kirtland Bouchard, MD

## 2021-11-25 NOTE — ED Provider Notes (Signed)
[facility] DEPT Provider Note   CSN: [id number] Arrival date & time: 11/25/21  1553     History  Chief Complaint  Patient presents with   Near Syncope    DEVARIO BUCKLEW is a 87 y.o. male history of TIA, reflux, here presenting with near syncope.  Patient is in assisted living.  He states that he has decreased appetite and some headache since yesterday.  Patient went to the doctor's office and passed out.  Patient was noted to be hypotensive with blood pressures 70s.  He was given 500 cc of normal saline and was sent here for further evaluation.  Of note, patient did test positive for COVID at the doctor's office.  Patient received both vaccines and 2 boosters  The history is provided by the patient.      Home Medications Prior to Admission medications   Medication Sig Start Date End Date Taking? Authorizing Provider  acetaminophen (PAIN RELIEF EXTRA STRENGTH) 500 MG tablet Take  2 tablets  4 x /day  with Meals & Bedtime for Pain 10/12/21   Unk Pinto, MD  ALPRAZolam (ALPRAZOLAM XR) 0.5 MG 24 hr tablet TAKE ONE TABLET BY MOUTH DAILY AT Gwendolyn Fill 10/12/21   Unk Pinto, MD  amLODipine (NORVASC) 10 MG tablet Take  1 tablet  Daily  (every 24 hours) if Standing Systolic BP is over 629 03/21/40   Unk Pinto, MD  Cholecalciferol (VITAMIN D3) 2000 UNITS capsule Take 8,000 Units by mouth daily.    [provider]  Cholecalciferol 50 MCG (2000 UT) TABS Take 1  capsule  Daily 10/12/21   Unk Pinto, MD  erythromycin ophthalmic ointment Place into both eyes 4 (four) times daily. 01/19/21   Hosie Poisson, MD  escitalopram (LEXAPRO) 10 MG tablet TAKE ONE TABLET DAILY FOR MOOD AND ANXIETY 06/09/21   Unk Pinto, MD  finasteride (PROSCAR) 5 MG tablet Take  1 tablet  Daily  for Prostate 10/12/21   Unk Pinto, MD  fludrocortisone (FLORINEF) 0.1 MG tablet TAKE ONE TABLET  EVERY MORNING **DO NOT HOLD** 06/09/21   Unk Pinto, MD   glucose blood (FREESTYLE LITE) test strip TEST BLOOD SUGAR ONCE DAILY 06/01/19   Unk Pinto, MD  Lancets (FREESTYLE) lancets 1 each by Other route as needed for other. Use as instructed    [provider]  levETIRAcetam (KEPPRA) 500 MG tablet Take 1 tablet (500 mg total) by mouth 2 (two) times daily. Take  1 tablet  2 x /day  with Breakfast & Supper to Prevent Seizures 09/09/21   Unk Pinto, MD  loperamide (IMODIUM) 2 MG capsule Take 1 tablet qod - hs  - even days 01/30/21   Unk Pinto, MD  Melatonin 5 MG CAPS Take 10 mg by mouth at bedtime.     [provider]  nitroGLYCERIN (NITROSTAT) 0.4 MG SL tablet Sig: 1 tablet under tongue every 3 to 5 minutes as needed for Angina - Please Dispense # 2 bottles of # 25 tabs Patient taking differently: Place 0.4 mg under the tongue every 5 (five) minutes as needed for chest pain. 07/17/18   Unk Pinto, MD  pantoprazole (PROTONIX) 40 MG tablet Take  1 tablet  Daily  to Prevent Heartburn & Indigestion 10/12/21   Unk Pinto, MD  polyethylene glycol (MIRALAX / GLYCOLAX) 17 g packet Take 17 g by mouth daily as needed for moderate constipation. 01/19/21   Hosie Poisson, MD  potassium citrate (UROCIT-K) 10 MEQ (1080 MG) SR tablet TAKE ONE  TABLET BY MOUTH 2 TIMES A DAY TO PREVENT KIDNEY STONES 10/14/21   Magda Bernheim, NP  QUEtiapine (SEROQUEL) 25 MG tablet TAKE ONE TABLET BY MOUTH 3 TIMES A DAY BEFORE MEALS TAKE 2 TABLETS (50MG ) BY MOUTH AT BEDTIME. 07/06/21   Unk Pinto, MD  simvastatin (ZOCOR) 80 MG tablet Take 1 tablet at Bedtime for Cholesterol Patient taking differently: Take 80 mg by mouth at bedtime. for Cholesterol 10/01/19   Unk Pinto, MD  spironolactone (ALDACTONE) 25 MG tablet Take  1 tablet  every Morning for BP & Heart 10/12/21   Unk Pinto, MD  tamsulosin [facility]) 0.4 MG CAPS capsule Take 1 capsule Daily for Prosate 09/09/21   Unk Pinto, MD  vitamin C (ASCORBIC ACID) 500 MG tablet Take 1  tablet Daily  10/14/21   Unk Pinto, MD  zinc gluconate 50 MG tablet Take 50 mg by mouth every other day.    [provider]      Allergies    Gabapentin, Prozac [fluoxetine hcl], Soma [carisoprodol], and Beta adrenergic blockers    Review of Systems   Review of Systems  Neurological:  Positive for syncope.  All other systems reviewed and are negative.  Physical Exam Updated Vital Signs BP (!) 142/75    Pulse 84    Temp 98.5 F (36.9 C) (Oral)    Resp 19    Wt 81.6 kg    SpO2 99%    BMI 25.10 kg/m  Physical Exam Vitals and nursing note reviewed.  Constitutional:      Comments: Chronically ill, slightly dehydrated   HENT:     Head: Normocephalic.     Nose: Nose normal.     Mouth/Throat:     Mouth: Mucous membranes are dry.  Eyes:     Extraocular Movements: Extraocular movements intact.     Pupils: Pupils are equal, round, and reactive to light.  Cardiovascular:     Rate and Rhythm: Normal rate and regular rhythm.     Pulses: Normal pulses.     Heart sounds: Normal heart sounds.  Pulmonary:     Effort: Pulmonary effort is normal.     Breath sounds: Normal breath sounds.  Abdominal:     General: Abdomen is flat.     Palpations: Abdomen is soft.  Musculoskeletal:        General: Normal range of motion.     Cervical back: Normal range of motion and neck supple.  Skin:    General: Skin is warm.     Capillary Refill: Capillary refill takes less than 2 seconds.  Neurological:     General: No focal deficit present.     Mental Status: He is oriented to person, place, and time.     Comments: No obvious facial droop.  Patient has normal strength bilateral arms and legs.  Psychiatric:        Mood and Affect: Mood normal.        Behavior: Behavior normal.    ED Results / Procedures / Treatments   Labs (all labs ordered are listed, but only abnormal results are displayed) Labs Reviewed  CBC WITH DIFFERENTIAL/PLATELET - Abnormal; Notable for the following  components:      Result Value   RBC 3.71 (*)    Hemoglobin 12.8 (*)    HCT 37.5 (*)    MCV 101.1 (*)    MCH 34.5 (*)    All other components within normal limits  I-STAT CHEM 8, ED - Abnormal; Notable for  the following components:   Creatinine, Ser 1.50 (*)    Hemoglobin 12.9 (*)    HCT 38.0 (*)    All other components within normal limits  RESP PANEL BY RT-PCR (FLU A&B, COVID) ARPGX2  CULTURE, BLOOD (ROUTINE X 2)  CULTURE, BLOOD (ROUTINE X 2)  COMPREHENSIVE METABOLIC PANEL  LACTIC ACID, PLASMA  LACTIC ACID, PLASMA  D-DIMER, QUANTITATIVE  PROCALCITONIN  LACTATE DEHYDROGENASE  FERRITIN  TRIGLYCERIDES  FIBRINOGEN  C-REACTIVE PROTEIN    EKG EKG Interpretation  Date/Time:  Wednesday November 25 2021 16:28:37 EST Ventricular Rate:  87 PR Interval:  231 QRS Duration: 77 QT Interval:  379 QTC Calculation: 456 R Axis:   50 Text Interpretation: Sinus rhythm Ventricular premature complex Prolonged PR interval No significant change since last tracing Confirmed by Wandra Arthurs [phone]) on 11/25/2021 5:20:33 PM  Radiology DG Chest 1 View  Result Date: 11/25/2021 CLINICAL DATA:  Near syncope, COVID positive, hypotension EXAM: CHEST  1 VIEW COMPARISON:  01/15/2021 FINDINGS: Single frontal view of the chest demonstrates a stable cardiac silhouette. Postsurgical changes from median sternotomy. Stable hiatal hernia. No airspace disease, effusion, or pneumothorax. No acute bony abnormalities. IMPRESSION: 1. No acute intrathoracic process. 2. Stable hiatal hernia. Electronically Signed   By: Randa Ngo M.D.   On: 11/25/2021 17:11   CT HEAD WO CONTRAST (5MM)  Result Date: 11/25/2021 CLINICAL DATA:  Mental status change, unknown cause EXAM: CT HEAD WITHOUT CONTRAST TECHNIQUE: Contiguous axial images were obtained from the base of the skull through the vertex without intravenous contrast. RADIATION DOSE REDUCTION: This exam was performed according to the departmental dose-optimization program  which includes automated exposure control, adjustment of the mA and/or kV according to patient size and/or use of iterative reconstruction technique. COMPARISON:  01/16/2021 FINDINGS: Brain: There is no acute intracranial hemorrhage, mass effect, or edema. No acute appearing loss of gray-white differentiation. Right greater than left encephalomalacia/gliosis at the site of prior frontal hemorrhages. There is no extra-axial fluid collection. Prominence of the ventricles and sulci reflects similar parenchymal volume loss. Confluent areas of low-density in the supratentorial white matter are nonspecific but probably reflect similar moderate to marked chronic microvascular ischemic changes. Vascular: There is atherosclerotic calcification at the skull base. Skull: Calvarium is unremarkable. Sinuses/Orbits: Minor mucosal thickening. Bilateral lens replacements. Other: None. IMPRESSION: No acute intracranial abnormality. Chronic/nonemergent findings detailed above. Electronically Signed   By: Macy Mis M.D.   On: 11/25/2021 17:06    Procedures Procedures    Medications Ordered in ED Medications  sodium chloride 0.9 % bolus 1,000 mL (1,000 mLs Intravenous New Bag/Given 11/25/21 1713)  metoCLOPramide (REGLAN) injection 10 mg (10 mg Intravenous Given 11/25/21 1705)  diphenhydrAMINE (BENADRYL) injection 25 mg (25 mg Intravenous Given 11/25/21 1705)    ED Course/ Medical Decision Making/ A&P                           Medical Decision Making DEERIC CRUISE is a 87 y.o. male here presenting with near syncope and hypotension.  Patient is COVID-positive in the office.  We will get [place] preadmission labs.  Patient does not require any oxygen right now.  I think the near syncope likely secondary to dehydration from COVID.  We will get CT head to rule out a head bleed. We will also get [place] preadmission labs including inflammatory markers.  Will hydrate patient with normal saline bolus and give migraine cocktail  and reassess.  6:33 PM Patient's  labs were reviewed.  Patient lactate is slightly elevated.  COVID test is positive.  Patient CT head reviewed and was unremarkable.  Patient was barely able to stand up and heart rate went up to the 120s.  Patient is unable to even take 1 step.  He is from assisted living I do not think he is stable to go back to assisted living.  Weakness likely secondary to COVID.  I ordered remdesivir.  Patient has no oxygen requirement.    Problems Addressed: COVID-19: acute illness or injury Near syncope: acute illness or injury SOB (shortness of breath): acute illness or injury  Amount and/or Complexity of Data Reviewed Labs: ordered. Radiology: ordered. ECG/medicine tests: ordered.  Risk Prescription drug management.   decision making why or why not admission, treatments were needed:1} Final Clinical Impression(s) / ED Diagnoses Final diagnoses:  SOB (shortness of breath)    Rx / DC Orders ED Discharge Orders     None         Drenda Freeze, MD 11/25/21 Bosie Helper

## 2021-11-26 DIAGNOSIS — E785 Hyperlipidemia, unspecified: Secondary | ICD-10-CM | POA: Diagnosis present

## 2021-11-26 DIAGNOSIS — Z888 Allergy status to other drugs, medicaments and biological substances status: Secondary | ICD-10-CM | POA: Diagnosis not present

## 2021-11-26 DIAGNOSIS — Z79899 Other long term (current) drug therapy: Secondary | ICD-10-CM | POA: Diagnosis not present

## 2021-11-26 DIAGNOSIS — F419 Anxiety disorder, unspecified: Secondary | ICD-10-CM | POA: Diagnosis present

## 2021-11-26 DIAGNOSIS — N1831 Chronic kidney disease, stage 3a: Secondary | ICD-10-CM | POA: Diagnosis present

## 2021-11-26 DIAGNOSIS — Z823 Family history of stroke: Secondary | ICD-10-CM | POA: Diagnosis not present

## 2021-11-26 DIAGNOSIS — E1122 Type 2 diabetes mellitus with diabetic chronic kidney disease: Secondary | ICD-10-CM | POA: Diagnosis present

## 2021-11-26 DIAGNOSIS — G4730 Sleep apnea, unspecified: Secondary | ICD-10-CM | POA: Diagnosis present

## 2021-11-26 DIAGNOSIS — Z8673 Personal history of transient ischemic attack (TIA), and cerebral infarction without residual deficits: Secondary | ICD-10-CM | POA: Diagnosis not present

## 2021-11-26 DIAGNOSIS — Z833 Family history of diabetes mellitus: Secondary | ICD-10-CM | POA: Diagnosis not present

## 2021-11-26 DIAGNOSIS — Z8249 Family history of ischemic heart disease and other diseases of the circulatory system: Secondary | ICD-10-CM | POA: Diagnosis not present

## 2021-11-26 DIAGNOSIS — F32A Depression, unspecified: Secondary | ICD-10-CM | POA: Diagnosis present

## 2021-11-26 DIAGNOSIS — I129 Hypertensive chronic kidney disease with stage 1 through stage 4 chronic kidney disease, or unspecified chronic kidney disease: Secondary | ICD-10-CM | POA: Diagnosis present

## 2021-11-26 DIAGNOSIS — R0602 Shortness of breath: Secondary | ICD-10-CM | POA: Diagnosis present

## 2021-11-26 DIAGNOSIS — J449 Chronic obstructive pulmonary disease, unspecified: Secondary | ICD-10-CM | POA: Diagnosis present

## 2021-11-26 DIAGNOSIS — I951 Orthostatic hypotension: Secondary | ICD-10-CM | POA: Diagnosis present

## 2021-11-26 DIAGNOSIS — I2510 Atherosclerotic heart disease of native coronary artery without angina pectoris: Secondary | ICD-10-CM | POA: Diagnosis not present

## 2021-11-26 DIAGNOSIS — K219 Gastro-esophageal reflux disease without esophagitis: Secondary | ICD-10-CM | POA: Diagnosis present

## 2021-11-26 DIAGNOSIS — E860 Dehydration: Secondary | ICD-10-CM | POA: Diagnosis present

## 2021-11-26 DIAGNOSIS — F5105 Insomnia due to other mental disorder: Secondary | ICD-10-CM | POA: Diagnosis present

## 2021-11-26 DIAGNOSIS — U071 COVID-19: Secondary | ICD-10-CM | POA: Diagnosis not present

## 2021-11-26 DIAGNOSIS — Z87891 Personal history of nicotine dependence: Secondary | ICD-10-CM | POA: Diagnosis not present

## 2021-11-26 DIAGNOSIS — Z951 Presence of aortocoronary bypass graft: Secondary | ICD-10-CM | POA: Diagnosis not present

## 2021-11-26 DIAGNOSIS — G20 Parkinson's disease: Secondary | ICD-10-CM | POA: Diagnosis present

## 2021-11-26 LAB — CBC
HCT: 34.9 % — ABNORMAL LOW (ref 39.0–52.0)
Hemoglobin: 11.6 g/dL — ABNORMAL LOW (ref 13.0–17.0)
MCH: 33.6 pg (ref 26.0–34.0)
MCHC: 33.2 g/dL (ref 30.0–36.0)
MCV: 101.2 fL — ABNORMAL HIGH (ref 80.0–100.0)
Platelets: 141 10*3/uL — ABNORMAL LOW (ref 150–400)
RBC: 3.45 MIL/uL — ABNORMAL LOW (ref 4.22–5.81)
RDW: 12.4 % (ref 11.5–15.5)
WBC: 4.3 10*3/uL (ref 4.0–10.5)
nRBC: 0 % (ref 0.0–0.2)

## 2021-11-26 LAB — BASIC METABOLIC PANEL
Anion gap: 3 — ABNORMAL LOW (ref 5–15)
BUN: 13 mg/dL (ref 8–23)
CO2: 27 mmol/L (ref 22–32)
Calcium: 8 mg/dL — ABNORMAL LOW (ref 8.9–10.3)
Chloride: 108 mmol/L (ref 98–111)
Creatinine, Ser: 1.21 mg/dL (ref 0.61–1.24)
GFR, Estimated: 59 mL/min — ABNORMAL LOW (ref >60)
Glucose, Bld: 100 mg/dL — ABNORMAL HIGH (ref 70–99)
Potassium: 4.6 mmol/L (ref 3.5–5.1)
Sodium: 138 mmol/L (ref 135–145)

## 2021-11-26 MED ORDER — TRAMADOL HCL 50 MG PO TABS
100.0000 mg | ORAL_TABLET | Freq: Three times a day (TID) | ORAL | Status: DC | PRN
  Administered 2021-11-26 – 2021-12-03 (×12): 100 mg via ORAL
  Filled 2021-11-26 (×12): qty 2

## 2021-11-26 MED ORDER — QUETIAPINE FUMARATE 25 MG PO TABS
50.0000 mg | ORAL_TABLET | Freq: Every day | ORAL | Status: DC
  Administered 2021-11-26 – 2021-12-02 (×7): 50 mg via ORAL
  Filled 2021-11-26 (×7): qty 2

## 2021-11-26 MED ORDER — ACETAMINOPHEN 325 MG PO TABS
650.0000 mg | ORAL_TABLET | ORAL | Status: DC | PRN
  Administered 2021-11-28 – 2021-12-02 (×9): 650 mg via ORAL
  Filled 2021-11-26 (×9): qty 2

## 2021-11-26 MED ORDER — METOCLOPRAMIDE HCL 5 MG/ML IJ SOLN
10.0000 mg | Freq: Four times a day (QID) | INTRAMUSCULAR | Status: DC | PRN
  Administered 2021-11-27 – 2021-12-02 (×9): 10 mg via INTRAVENOUS
  Filled 2021-11-26 (×9): qty 2

## 2021-11-26 MED ORDER — KETOROLAC TROMETHAMINE 15 MG/ML IJ SOLN
15.0000 mg | Freq: Four times a day (QID) | INTRAMUSCULAR | Status: AC | PRN
  Administered 2021-11-27 – 2021-11-30 (×6): 15 mg via INTRAVENOUS
  Filled 2021-11-26 (×6): qty 1

## 2021-11-26 MED ORDER — PHENOL 1.4 % MT LIQD
1.0000 | OROMUCOSAL | Status: DC | PRN
  Administered 2021-11-26 (×2): 1 via OROMUCOSAL
  Filled 2021-11-26: qty 177

## 2021-11-26 MED ORDER — LACTATED RINGERS IV SOLN: INTRAVENOUS | Status: DC

## 2021-11-26 MED ORDER — DIPHENHYDRAMINE HCL 50 MG/ML IJ SOLN
12.5000 mg | Freq: Four times a day (QID) | INTRAMUSCULAR | Status: DC | PRN
  Administered 2021-11-26 – 2021-12-02 (×12): 12.5 mg via INTRAVENOUS
  Filled 2021-11-26 (×12): qty 1

## 2021-11-26 NOTE — Progress Notes (Signed)
Patient is very weak to stand up. Will not be able to do orthostatics at this point.

## 2021-11-26 NOTE — Progress Notes (Signed)
Pt with request for a sleeping pill, RN notified

## 2021-11-26 NOTE — Assessment & Plan Note (Addendum)
-  Continue home Lexapro, Seroquel, alprazolam.

## 2021-11-26 NOTE — Hospital Course (Signed)
John [name] is a 87 y.o. 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

## 2021-11-26 NOTE — Evaluation (Signed)
Physical Therapy Evaluation Patient Details Name: John [name] MRN: [id number] DOB: November 28, 1934 Today's Date: 11/26/2021  History of Present Illness  Patient is an 87 y.o. male who presented to the ED from his PCP office for evaluation of near syncope with hypotension. 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    Clinical Impression  John [name] is 87 y.o. male admitted with above HPI and diagnosis. Patient is currently limited by functional impairments below (see PT problem list). Patient is a resident at [facility] and is independent with RW for mobility at baseline but requires assist from staff for bathing. Patient currently requires min-mod assist for bed mobility and functional transfers with RW. He had significant drop in BP from supine to standing (see below). Patient will benefit from continued skilled PT interventions to address impairments and progress independence with mobility, recommending pt return to ALF with assistance from staff and HHPT to address impairments and improve independence. Acute PT will follow and progress as able.        Orthostatic VS for the past 24 hrs:  BP- Lying Pulse- Lying BP- Sitting Pulse- Sitting BP- Standing at 0 minutes Pulse- Standing at 0 minutes BP- Standing at 3 minutes (Pt sat down part way through measures) Pulse- Standing at 3 minutes (Pt sat down part way through measures)  11/26/21 0952 142/77 87 130/72 91 105/66 102 106/64 103   **Patient required seated rest during final BP assessment due to symptoms (dizziness, lightheaded)  Recommendations for follow up therapy are one component of a multi-disciplinary discharge planning process, led by the attending physician.  Recommendations may be updated based on patient status, additional functional criteria and insurance authorization.  Follow Up  Recommendations Home health PT (return to ALF with HHPT services and 24/7 assist from staff)    Assistance Recommended at Discharge Frequent or constant Supervision/Assistance  Patient can return home with the following  A little help with walking and/or transfers;A little help with bathing/dressing/bathroom;Assist for transportation;Help with stairs or ramp for entrance    Equipment Recommendations None recommended by PT  Recommendations for Other Services       Functional Status Assessment Patient has had a recent decline in their functional status and demonstrates the ability to make significant improvements in function in a reasonable and predictable amount of time.     Precautions / Restrictions        Mobility  Bed Mobility Overal bed mobility: Needs Assistance Bed Mobility: Rolling, Sidelying to Sit Rolling: Min assist Sidelying to sit: Mod assist, +2 for safety/equipment       General bed mobility comments: Cues for sequencing and assist to roll to Lt side with use of bed rail and assist from therapist. Mod Assist to bring LE's off EOB and press up trunk.    Transfers Overall transfer level: Needs assistance Equipment used: Rolling walker (2 wheels) Transfers: Sit to/from Stand, Bed to chair/wheelchair/BSC Sit to Stand: Min assist, +2 safety/equipment   Step pivot transfers: Min assist, +2 safety/equipment       General transfer comment: cues for hand placement with pt using single UE on EOB to power up to RW. min assist to complete rise and steady. Assist to manage/direct walker to turn and step to recliner. no LOB noted and pt took small backward steps to chair.    Ambulation/Gait Ambulation/Gait assistance: Min assist Gait Distance (Feet): 4 Feet Assistive device: Rolling  walker (2 wheels) Gait Pattern/deviations: Step-through pattern, Decreased step length - left, Decreased step length - right, Decreased stride length, Trunk flexed Gait velocity: decr      General Gait Details: pt took small steps with RW to move bed>chair with Min assist to steady and manage walker. further gait deffered due to dizziness and orthostatic drop with activity.  Stairs            Wheelchair Mobility    Modified Rankin (Stroke Patients Only)       Balance Overall balance assessment: Needs assistance Sitting-balance support: Bilateral upper extremity supported, Feet supported Sitting balance-Leahy Scale: Fair     Standing balance support: During functional activity, Reliant on assistive device for balance, Bilateral upper extremity supported Standing balance-Leahy Scale: Poor                               Pertinent Vitals/Pain Pain Assessment Pain Assessment: 0-10 Pain Score: 8  Pain Location: headache Pain Descriptors / Indicators: Headache Pain Intervention(s): Monitored during session    Home Living Family/patient expects to be discharged to:: Assisted living Living Arrangements: Alone               Home Equipment: Rollator (4 wheels);Kasandra Knudsen - single point Additional Comments: [facility] since April last year    Prior Function Prior Level of Function : Needs assist       Physical Assist : Mobility (physical);ADLs (physical)     Mobility Comments: pt uses RW/rollator for gait in facility and is able to ambulate around facility and to bathroom without assist.       Hand Dominance   Dominant Hand: Right    Extremity/Trunk Assessment   Upper Extremity Assessment Upper Extremity Assessment: Defer to OT evaluation    Lower Extremity Assessment Lower Extremity Assessment: Generalized weakness    Cervical / Trunk Assessment Cervical / Trunk Assessment: Normal  Communication   Communication: HOH (soft spoken)  Cognition Arousal/Alertness: Awake/alert Behavior During Therapy: WFL for tasks assessed/performed Overall Cognitive Status: Within Functional Limits for tasks assessed                                           General Comments      Exercises     Assessment/Plan    PT Assessment Patient needs continued PT services  PT Problem List Decreased strength;Decreased activity tolerance;Decreased balance;Decreased mobility;Decreased knowledge of use of DME;Decreased knowledge of precautions;Cardiopulmonary status limiting activity       PT Treatment Interventions DME instruction;Gait training;Stair training;Functional mobility training;Therapeutic activities;Therapeutic exercise;Balance training;Patient/family education    PT Goals (Current goals can be found in the Care Plan section)  Acute Rehab PT Goals Patient Stated Goal: stop being dizzy, get back to independence at ALF PT Goal Formulation: With patient/family Time For Goal Achievement: 12/10/21 Potential to Achieve Goals: Good    Frequency Min 3X/week     Co-evaluation PT/OT/SLP Co-Evaluation/Treatment: Yes Reason for Co-Treatment: To address functional/ADL transfers;For patient/therapist safety PT goals addressed during session: Mobility/safety with mobility;Balance;Proper use of DME         AM-PAC PT "6 Clicks" Mobility  Outcome Measure Help needed turning from your back to your side while in a flat bed without using bedrails?: A Little Help needed moving from lying on your back to sitting on the side of a flat bed  without using bedrails?: A Lot Help needed moving to and from a bed to a chair (including a wheelchair)?: A Little Help needed standing up from a chair using your arms (e.g., wheelchair or bedside chair)?: A Little Help needed to walk in hospital room?: A Lot Help needed climbing 3-5 steps with a railing? : Total 6 Click Score: 14    End of Session Equipment Utilized During Treatment: Gait belt Activity Tolerance: Patient tolerated treatment well Patient left: in chair;with call bell/phone within reach;with chair alarm set;with family/visitor present Nurse Communication: Mobility  status PT Visit Diagnosis: Muscle weakness (generalized) (M62.81);Difficulty in walking, not elsewhere classified (R26.2);Other symptoms and signs involving the nervous system (R29.898);Other abnormalities of gait and mobility (R26.89)    Time: 1173-5670 PT Time Calculation (min) (ACUTE ONLY): 26 min   Charges:   PT Evaluation $PT Eval Moderate Complexity: 1 Mod          Verner Mould, DPT Acute Rehabilitation Services Office [phone] Pager [phone]   Jacques Navy 11/26/2021, 10:34 AM

## 2021-11-26 NOTE — Evaluation (Signed)
Occupational Therapy Evaluation Patient Details Name: John [name] MRN: [id number] DOB: September 04, 1935 Today's Date: 11/26/2021   History of Present Illness Patient is an 87 y.o. male who presented to the ED from his PCP office for evaluation of near syncope with hypotension. 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   Clinical Impression   Pt admitted with the above diagnoses and presents with below problem list. Pt will benefit from continued acute OT to address the below listed deficits and maximize independence with basic ADLs prior to d/c to back to ALF. At baseline, pt needs assistance with bathing and dressing, utilized rollator for functional transfers/mobility. Pt limited by symptomatic orthostatic hypotension with onset in standing, worsened with time. Pt currently needs min A +2 s/e with bed mobility and functional transfers, up to mod A with bathing/dressing. Will need chair follow to safely progress mobility. Son present throughout session.   BP supine: 142/77 BP EOB: 130/72 BP standing at 0 minutes: 105/66 BP standing at 3 minutes (did need to sit prematurely d/t worsening lightheadedness): 106/64     Recommendations for follow up therapy are one component of a multi-disciplinary discharge planning process, led by the attending physician.  Recommendations may be updated based on patient status, additional functional criteria and insurance authorization.   Follow Up Recommendations  Home health OT    Assistance Recommended at Discharge Frequent or constant Supervision/Assistance  Patient can return home with the following A little help with walking and/or transfers;A little help with bathing/dressing/bathroom    Functional Status Assessment  Patient has had a recent decline in their functional status and demonstrates the ability to make significant  improvements in function in a reasonable and predictable amount of time.  Equipment Recommendations  None recommended by OT    Recommendations for Other Services       Precautions / Restrictions Precautions Precautions: Fall      Mobility Bed Mobility Overal bed mobility: Needs Assistance Bed Mobility: Rolling, Sidelying to Sit Rolling: Min assist Sidelying to sit: Mod assist, +2 for safety/equipment       General bed mobility comments: Cues for sequencing and assist to roll to Lt side with use of bed rail and assist from therapist. Mod Assist to bring LE's off EOB and press up trunk.    Transfers Overall transfer level: Needs assistance Equipment used: Rolling walker (2 wheels) Transfers: Sit to/from Stand, Bed to chair/wheelchair/BSC Sit to Stand: Min assist, +2 safety/equipment     Step pivot transfers: Min assist, +2 safety/equipment     General transfer comment: cues for hand placement with pt using single UE on EOB to power up to RW. min assist to complete rise and steady. Assist to manage/direct walker to turn and step to recliner. no LOB noted and pt took small backward steps to chair.      Balance Overall balance assessment: Needs assistance Sitting-balance support: Bilateral upper extremity supported, Feet supported Sitting balance-Leahy Scale: Fair     Standing balance support: During functional activity, Reliant on assistive device for balance, Bilateral upper extremity supported Standing balance-Leahy Scale: Poor                             ADL either performed or assessed with clinical judgement   ADL Overall ADL's : Needs assistance/impaired Eating/Feeding: Set up;Sitting   Grooming: Minimal assistance;Set up;Sitting Grooming Details (indicate cue  type and reason): up to min A for full grooming session Upper Body Bathing: Moderate assistance;Sitting;Minimal assistance   Lower Body Bathing: Moderate assistance;Sit to/from stand    Upper Body Dressing : Minimal assistance;Moderate assistance;Sitting   Lower Body Dressing: Moderate assistance;Sit to/from stand   Toilet Transfer: Minimal assistance;+2 for safety/equipment;Stand-pivot;BSC/3in1;Rolling walker (2 wheels) Toilet Transfer Details (indicate cue type and reason): simulated with bed to recliner Toileting- Clothing Manipulation and Hygiene: Moderate assistance;Sit to/from stand         General ADL Comments: Pt able to take pivotal steps from EOB to recliner with min A +2 s/e. + Orthostatsis limiting session.     Vision         Perception     Praxis      Pertinent Vitals/Pain Pain Assessment Pain Assessment: 0-10 Pain Score: 8  Pain Location: headache Pain Descriptors / Indicators: Aching Pain Intervention(s): Monitored during session, Limited activity within patient's tolerance, Repositioned, Premedicated before session (Tylenol prior to session)     Hand Dominance Right   Extremity/Trunk Assessment Upper Extremity Assessment Upper Extremity Assessment: Generalized weakness   Lower Extremity Assessment Lower Extremity Assessment: Defer to PT evaluation   Cervical / Trunk Assessment Cervical / Trunk Assessment: Normal   Communication Communication Communication: HOH (soft spoken)   Cognition Arousal/Alertness: Awake/alert Behavior During Therapy: WFL for tasks assessed/performed Overall Cognitive Status: Within Functional Limits for tasks assessed                                       General Comments  SpO2 94 on RA. + orthostatics onset in standing    Exercises     Shoulder Instructions      Home Living Family/patient expects to be discharged to:: Assisted living Living Arrangements: Alone                           Home Equipment: Rollator (4 wheels);Kasandra Knudsen - single point   Additional Comments: [facility] since April last year      Prior Functioning/Environment Prior Level of Function :  Needs assist       Physical Assist : Mobility (physical);ADLs (physical)     Mobility Comments: pt uses RW/rollator for gait in facility and is able to ambulate around facility and to bathroom without assist.          OT Problem List: Decreased strength;Impaired balance (sitting and/or standing);Decreased activity tolerance;Decreased knowledge of use of DME or AE;Decreased knowledge of precautions;Cardiopulmonary status limiting activity;Pain      OT Treatment/Interventions: Self-care/ADL training;Balance training;Patient/family education;Therapeutic activities;DME and/or AE instruction;Therapeutic exercise;Energy conservation    OT Goals(Current goals can be found in the care plan section) Acute Rehab OT Goals Patient Stated Goal: feel better, misses playing golf OT Goal Formulation: With patient/family Time For Goal Achievement: 12/10/21 Potential to Achieve Goals: Good  OT Frequency: Min 2X/week    Co-evaluation PT/OT/SLP Co-Evaluation/Treatment: Yes Reason for Co-Treatment: To address functional/ADL transfers;For patient/therapist safety PT goals addressed during session: Mobility/safety with mobility;Balance;Proper use of DME OT goals addressed during session: ADL's and self-care      AM-PAC OT "6 Clicks" Daily Activity     Outcome Measure Help from another person eating meals?: None Help from another person taking care of personal grooming?: A Little Help from another person toileting, which includes using toliet, bedpan, or urinal?: A Little Help from another person bathing (including  washing, rinsing, drying)?: A Little Help from another person to put on and taking off regular upper body clothing?: A Little Help from another person to put on and taking off regular lower body clothing?: A Little 6 Click Score: 19   End of Session Equipment Utilized During Treatment: Rolling walker (2 wheels);Gait belt Nurse Communication: Mobility status  Activity Tolerance: Other  (comment) (symptomatic orthostasis in standing, worse with time) Patient left: in chair;with call bell/phone within reach;with family/visitor present;with chair alarm set  OT Visit Diagnosis: Unsteadiness on feet (R26.81);Muscle weakness (generalized) (M62.81);Pain                Time: 0981-1914 OT Time Calculation (min): 25 min Charges:  OT General Charges $OT Visit: 1 Visit OT Evaluation $OT Eval Low Complexity: [place], OT Acute Rehabilitation Services Office: [phone]   Hortencia Pilar 11/26/2021, 11:52 AM

## 2021-11-26 NOTE — Progress Notes (Signed)
Progress Note   Patient: John [name] QAS:[id number] DOB: May 31, 1935 DOA: 11/25/2021     0 DOS: the patient was seen and examined on 11/26/2021   Brief hospital course: EMANUAL [place]AMOUNTAIN is a 87 y.o. 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  Assessment and Plan: * COVID-19 virus infection- (present on admission) SARS-CoV-2 positive 05/02/8920 complicated by dehydration with hypotension and near syncope as well as generalized weakness.  Saturating well on room air without evidence of pneumonia/infiltrate on CXR. -Started on IV remdesivir, continue  Near syncope- (present on admission) Secondary to hypotension/dehydration due to poor oral intake in setting of COVID-19 viral infection. -Continue IV fluid hydration -PT/OT eval, will need ongoing PT at SNF  Autonomic postural hypotension- (present on admission) Resume home Florinef 0.1 mg twice daily.  Hold amlodipine and spironolactone given worsening hypotension at rest. -Still orthostatic today on repeat check with physical therapy  CAD (coronary artery disease)- (present on admission) Stable, denies any chest pain.  Continue statin.  History of intracranial hemorrhage Continue home Keppra for seizure prophylaxis. CT head without contrast on admission negative for acute intracranial abnormality.  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 anxiety- (present on admission) Continue home Lexapro, Seroquel, alprazolam.  CKD stage G3a/A1, GFR 45-59 and albumin creatinine ratio <30 mg/g ([place])- (present on admission) Creatinine is slightly diminished compared to recent baseline.  Continue IV fluid hydration and repeat labs in AM.  Holding spironolactone as above.      Subjective:  Seen this morning with son present bedside.  He was still lethargic and complaining of a headache.  Orthostatics were positive when he was working with physical therapy. Reviewed that we would continue COVID treatment for now, fluids, and repeat orthostatics tomorrow.  Physical Exam: Vitals:   11/26/21 0500 11/26/21 0857 11/26/21 0952 11/26/21 1326  BP:  132/78  (!) 143/55  Pulse:  72  (!) 59  Resp:  18  20  Temp:  98.7 F (37.1 C)  98 F (36.7 C)  TempSrc:  Oral  Oral  SpO2:  97% 94% 96%  Weight: 81.4 kg     Physical Exam Constitutional:      General: He is not in acute distress.    Appearance: Normal appearance.  HENT:     Head: Normocephalic and atraumatic.     Mouth/Throat:     Mouth: Mucous membranes are moist.  Eyes:     Extraocular Movements: Extraocular movements intact.  Cardiovascular:     Rate and Rhythm: Normal rate and regular rhythm.     Heart sounds: Normal heart sounds.  Pulmonary:     Effort: Pulmonary effort is normal. No respiratory distress.     Breath sounds: Normal breath sounds. No wheezing.  Abdominal:     General: Bowel sounds are normal. There is no distension.     Palpations: Abdomen is soft.     Tenderness: There is no abdominal tenderness.  Musculoskeletal:        General: Normal range of motion.     Cervical back: Normal range of motion and neck supple.  Skin:    General: Skin is warm and dry.  Neurological:     General: No focal deficit present.     Mental Status: He is alert.  Psychiatric:        Mood  and Affect: Mood normal.        Behavior: Behavior normal.     Data Reviewed:  I have Reviewed nursing notes, Vitals, and Lab results since pt's last encounter. Pertinent lab results creat 1.21 I have ordered test including BMP, CBC, Mg I have reviewed the last note from all staff over past 24 hours,  I have discussed pt's care plan and test results with nursing staff, CM.   Family Communication: son  Disposition: Status is:  Inpatient Remains inpatient appropriate because: ongoing covid treatment and IVF for orthostasis    Planned Discharge Destination: Skilled nursing facility  DVT ppx: Lovenox      Author: Dwyane Dee, MD 11/26/2021 6:47 PM  For on call review [email or web address].

## 2021-11-26 NOTE — Assessment & Plan Note (Addendum)
-   Continue statin, stable.  No chest pain.

## 2021-11-26 NOTE — Assessment & Plan Note (Addendum)
-   CT value: 21.2 -SARS-CoV-2 positive 0/10/3141 complicated by dehydration with hypotension, near syncope, generalized weakness.   -Completed remdesivir course -PT recommended SNF

## 2021-11-26 NOTE — Assessment & Plan Note (Addendum)
-   Creatinine is slightly diminished compared to recent baseline.   -Continue to encourage oral intake.   -Creatinine improving and now at baseline

## 2021-11-26 NOTE — Assessment & Plan Note (Addendum)
-   Patient was on max dose of Florinef with ongoing hypotension, patient then transitioned to midodrine and Florinef discontinued. - hold spironolactone, continue amlodipine 5 mg daily if standing SBP above >160 - continue midodrine (only hold if SBP>160), may titrate down to 5 mg if SBP has been running high consistently.

## 2021-11-26 NOTE — Assessment & Plan Note (Addendum)
Secondary to hypotension/dehydration due to poor oral intake in setting of COVID-19 viral infection. -Received IV fluid hydration -PT recommended SNF

## 2021-11-26 NOTE — Assessment & Plan Note (Addendum)
-  Continue home [place] for seizure prophylaxis.  -CT head without contrast on admission negative for acute intracranial abnormality. -Stable, no acute issues

## 2021-11-27 DIAGNOSIS — I951 Orthostatic hypotension: Secondary | ICD-10-CM | POA: Diagnosis not present

## 2021-11-27 DIAGNOSIS — U071 COVID-19: Secondary | ICD-10-CM | POA: Diagnosis not present

## 2021-11-27 LAB — CBC WITH DIFFERENTIAL/PLATELET
Abs Immature Granulocytes: 0.01 10*3/uL (ref 0.00–0.07)
Basophils Absolute: 0 10*3/uL (ref 0.0–0.1)
Basophils Relative: 1 %
Eosinophils Absolute: 0 10*3/uL (ref 0.0–0.5)
Eosinophils Relative: 0 %
HCT: 36 % — ABNORMAL LOW (ref 39.0–52.0)
Hemoglobin: 12.3 g/dL — ABNORMAL LOW (ref 13.0–17.0)
Immature Granulocytes: 0 %
Lymphocytes Relative: 17 %
Lymphs Abs: 1.1 10*3/uL (ref 0.7–4.0)
MCH: 34.3 pg — ABNORMAL HIGH (ref 26.0–34.0)
MCHC: 34.2 g/dL (ref 30.0–36.0)
MCV: 100.3 fL — ABNORMAL HIGH (ref 80.0–100.0)
Monocytes Absolute: 0.8 10*3/uL (ref 0.1–1.0)
Monocytes Relative: 12 %
Neutro Abs: 4.3 10*3/uL (ref 1.7–7.7)
Neutrophils Relative %: 70 %
Platelets: 144 10*3/uL — ABNORMAL LOW (ref 150–400)
RBC: 3.59 MIL/uL — ABNORMAL LOW (ref 4.22–5.81)
RDW: 12.1 % (ref 11.5–15.5)
WBC: 6.1 10*3/uL (ref 4.0–10.5)
nRBC: 0 % (ref 0.0–0.2)

## 2021-11-27 LAB — MAGNESIUM: Magnesium: 1.7 mg/dL (ref 1.7–2.4)

## 2021-11-27 LAB — COMPREHENSIVE METABOLIC PANEL
ALT: 12 U/L (ref 0–44)
AST: 17 U/L (ref 15–41)
Albumin: 3.1 g/dL — ABNORMAL LOW (ref 3.5–5.0)
Alkaline Phosphatase: 52 U/L (ref 38–126)
Anion gap: 7 (ref 5–15)
BUN: 14 mg/dL (ref 8–23)
CO2: 25 mmol/L (ref 22–32)
Calcium: 7.9 mg/dL — ABNORMAL LOW (ref 8.9–10.3)
Chloride: 102 mmol/L (ref 98–111)
Creatinine, Ser: 1.19 mg/dL (ref 0.61–1.24)
GFR, Estimated: >60 mL/min (ref >60)
Glucose, Bld: 108 mg/dL — ABNORMAL HIGH (ref 70–99)
Potassium: 3.6 mmol/L (ref 3.5–5.1)
Sodium: 134 mmol/L — ABNORMAL LOW (ref 135–145)
Total Bilirubin: 0.5 mg/dL (ref 0.3–1.2)
Total Protein: 5.7 g/dL — ABNORMAL LOW (ref 6.5–8.1)

## 2021-11-27 LAB — D-DIMER, QUANTITATIVE: D-Dimer, Quant: <0.27 ug/mL-FEU (ref 0.00–0.50)

## 2021-11-27 LAB — C-REACTIVE PROTEIN: CRP: 7.2 mg/dL — ABNORMAL HIGH (ref <1.0)

## 2021-11-27 LAB — LACTATE DEHYDROGENASE: LDH: 117 U/L (ref 98–192)

## 2021-11-27 NOTE — Progress Notes (Signed)
Noted pt is frequently requesting sleep aide. Pt has received [place] x2 and Benadryl . Upon arrival or intimal assessment pt's son was requesting patient get something for sleep at the change of shift. Noted patient received Xanax  at 1159am yesterday and patient slept most of the day. Noted patient may need to not receive sleep aide mid day and get something stronger at [facility] than what is currently on there MAR. On going assessment

## 2021-11-27 NOTE — Plan of Care (Signed)
Plan of care reviewed with patients son and patient. Family expressed an understanding Problem: Clinical Measurements: Goal: Ability to maintain clinical measurements within normal limits will improve Outcome: Progressing Goal: Will remain free from infection Outcome: Progressing Goal: Diagnostic test results will improve Outcome: Progressing Goal: Respiratory complications will improve Outcome: Progressing Goal: Cardiovascular complication will be avoided Outcome: Progressing

## 2021-11-27 NOTE — Progress Notes (Signed)
Progress Note   Patient: John [name] OZY:[id number] DOB: [birth year] DOA: 11/25/2021     1 DOS: the patient was seen and examined on 11/27/2021   Brief hospital course: John [name] is a 87 y.o. 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  Assessment and Plan: * COVID-19 virus infection- (present on admission) - CT value: 21.2 SARS-CoV-2 positive 0/01/8888 complicated by dehydration with hypotension and near syncope as well as generalized weakness.  Saturating well on room air without evidence of pneumonia/infiltrate on CXR. -Started on IV remdesivir, continue  Near syncope- (present on admission) Secondary to hypotension/dehydration due to poor oral intake in setting of COVID-19 viral infection. -Continue IV fluid hydration -PT/OT eval, will need ongoing PT at SNF  Autonomic postural hypotension- (present on admission) Resume home Florinef 0.1 mg twice daily.  Hold amlodipine and spironolactone given worsening hypotension at rest. -BP improving; keep off BP meds for now - repeat orthostatics today   CAD (coronary artery disease)- (present on admission) Stable, denies any chest pain.  Continue statin.  History of intracranial hemorrhage Continue home Keppra for seizure prophylaxis. CT head without contrast on admission negative for acute intracranial abnormality.  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 anxiety- (present on admission) Continue home Lexapro, Seroquel, alprazolam.  CKD stage G3a/A1, GFR 45-59 and albumin creatinine ratio <30 mg/g ([place])- (present on admission) Creatinine is slightly diminished compared to recent baseline.   - creat improved with IVF however more congested on lung exam today; d/c IVF and  monitor - encourage better oral intake       Subjective:  Did not sleep well overnight.  Son present bedside this morning.  He feels a little weaker, more congested, and still with ongoing headache.  Physical Exam: Vitals:   11/26/21 1930 11/27/21 0304 11/27/21 0407 11/27/21 1245  BP: (!) 147/66 (!) 141/73  113/68  Pulse: 83 91  84  Resp: (!) 22 18  20   Temp: (!) 100.4 F (38 C) 99.8 F (37.7 C)  99 F (37.2 C)  TempSrc: Oral Oral  Oral  SpO2: 90% (!) 89%  (!) 86%  Weight:   81 kg   Physical Exam Constitutional:      General: He is not in acute distress.    Appearance: Normal appearance.  HENT:     Head: Normocephalic and atraumatic.     Mouth/Throat:     Mouth: Mucous membranes are moist.  Eyes:     Extraocular Movements: Extraocular movements intact.  Cardiovascular:     Rate and Rhythm: Normal rate and regular rhythm.     Heart sounds: Normal heart sounds.  Pulmonary:     Effort: Pulmonary effort is normal. No respiratory distress.     Breath sounds: No wheezing.     Comments: More congestion and coarse breath sounds appreciated bilaterally Abdominal:     General: Bowel sounds are normal. There is no distension.     Palpations: Abdomen is soft.     Tenderness: There is no abdominal tenderness.  Musculoskeletal:        General: Normal range of motion.     Cervical back: Normal range of motion and neck supple.  Skin:    General: Skin is warm and dry.  Neurological:     General: No focal deficit present.  Mental Status: He is alert.  Psychiatric:        Mood and Affect: Mood normal.        Behavior: Behavior normal.     Data Reviewed:  I have Reviewed nursing notes, Vitals, and Lab results since pt's last encounter. Pertinent lab results creat 1.19 I have ordered test including BMP, CBC, Mg I have reviewed the last note from all staff over past 24 hours,  I have discussed pt's care plan and test results with nursing staff, CM.   Family  Communication: son  Disposition: Status is: Inpatient Remains inpatient appropriate because: ongoing covid treatment    Planned Discharge Destination: Skilled nursing facility  DVT ppx: Lovenox     Author: Dwyane Dee, MD 11/27/2021 3:07 PM  For on call review [email or web address].

## 2021-11-27 NOTE — TOC Initial Note (Signed)
Transition of Care [facility]) - Initial/Assessment Note    Patient Details  Name: John [name] MRN: [id number] Date of Birth: 06-05-1935  Transition of Care [facility]) CM/SW Contact:    Trish Mage, LCSW Phone Number: 11/27/2021, 3:35 PM  Clinical Narrative:   Resident of [facility]F is recommended for  [facility] PT.  Spoke with facility representative who states they provide in-house PT, would just need orders FAXed along with FL2 and d/c summary to [phone] at d/c. TOC will continue to follow during the course of hospitalization.                Expected Discharge Plan: [place] Barriers to Discharge: No Barriers Identified   Patient Goals and CMS Choice        Expected Discharge Plan and Services Expected Discharge Plan: [place]       Living arrangements for the past 2 months: [place]                                      Prior Living Arrangements/Services Living arrangements for the past 2 months: [place] Lives with:: Facility Resident                   Activities of Daily Living Home Assistive Devices/Equipment: Grab bars in shower, Environmental consultant (specify type) ADL Screening (condition at time of admission) Patient's cognitive ability adequate to safely complete daily activities?: Yes Is the patient deaf or have difficulty hearing?: No Does the patient have difficulty seeing, even when wearing glasses/contacts?: No Does the patient have difficulty concentrating, remembering, or making decisions?: Yes Patient able to express need for assistance with ADLs?: Yes Does the patient have difficulty dressing or bathing?: Yes Independently performs ADLs?: No Communication: Independent Dressing (OT): Dependent Is this a change from baseline?: Pre-admission baseline Grooming: Needs assistance Is this a change from baseline?: Pre-admission baseline Feeding: Independent Bathing: Needs  assistance Is this a change from baseline?: Pre-admission baseline Toileting: Needs assistance Is this a change from baseline?: Pre-admission baseline In/Out Bed: Needs assistance Is this a change from baseline?: Pre-admission baseline Walks in Home: Needs assistance Is this a change from baseline?: Pre-admission baseline Does the patient have difficulty walking or climbing stairs?: Yes Weakness of Legs: None Weakness of Arms/Hands: None  Permission Sought/Granted                  Emotional Assessment              Admission diagnosis:  SOB (shortness of breath) [R06.02] Near syncope [R55] COVID-19 virus infection [U07.1] COVID-19 [U07.1] Patient Active Problem List   Diagnosis Date Noted   COVID-19 virus infection 11/25/2021   History of intracranial hemorrhage 11/25/2021   Near syncope 11/25/2021   CAD (coronary artery disease)    Intracranial bleed ([place]) 01/15/2021   SAH (subarachnoid hemorrhage) ([place])    Non-traumatic rhabdomyolysis    Primary osteoarthritis of left knee 09/26/2019   S/P left knee arthroscopy 09/26/2019   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   Adrenal insufficiency ([place]) 09/25/2019   Acute medial meniscus tear of left knee 08/09/2019   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 anxiety 02/19/2018   Diabetic peripheral neuropathy ([place]) 11/21/2017   BMI 21.0-21.9, adult 09/04/2015   Esophageal reflux 07/09/2015   Autonomic postural hypotension 02/06/2015   CKD stage G3a/A1, GFR 45-59 and albumin  creatinine ratio <30 mg/g ([facility]) 03/20/2014   Medication management 02/06/2014   Vitamin D deficiency 02/06/2014   Gastric AV malformation 10/30/2012   History of colonic polyps 09/14/2011   Mixed hyperlipidemia 03/12/2009   Essential hypertension 03/12/2009   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

## 2021-11-27 NOTE — Progress Notes (Signed)
Pt returned to bed V/S taken O2 92 % increased O2 to 3 L repositioned to High fowlers had pt turn deep breath x3 O2 increased to 93%

## 2021-11-28 ENCOUNTER — Encounter (HOSPITAL_COMMUNITY): Payer: Self-pay | Admitting: Internal Medicine

## 2021-11-28 DIAGNOSIS — U071 COVID-19: Secondary | ICD-10-CM | POA: Diagnosis not present

## 2021-11-28 DIAGNOSIS — I951 Orthostatic hypotension: Secondary | ICD-10-CM | POA: Diagnosis not present

## 2021-11-28 LAB — CBC WITH DIFFERENTIAL/PLATELET
Abs Immature Granulocytes: 0.02 10*3/uL (ref 0.00–0.07)
Basophils Absolute: 0 10*3/uL (ref 0.0–0.1)
Basophils Relative: 1 %
Eosinophils Absolute: 0.1 10*3/uL (ref 0.0–0.5)
Eosinophils Relative: 1 %
HCT: 35.5 % — ABNORMAL LOW (ref 39.0–52.0)
Hemoglobin: 11.6 g/dL — ABNORMAL LOW (ref 13.0–17.0)
Immature Granulocytes: 0 %
Lymphocytes Relative: 22 %
Lymphs Abs: 1.6 10*3/uL (ref 0.7–4.0)
MCH: 33 pg (ref 26.0–34.0)
MCHC: 32.7 g/dL (ref 30.0–36.0)
MCV: 100.9 fL — ABNORMAL HIGH (ref 80.0–100.0)
Monocytes Absolute: 0.6 10*3/uL (ref 0.1–1.0)
Monocytes Relative: 9 %
Neutro Abs: 4.8 10*3/uL (ref 1.7–7.7)
Neutrophils Relative %: 67 %
Platelets: 133 10*3/uL — ABNORMAL LOW (ref 150–400)
RBC: 3.52 MIL/uL — ABNORMAL LOW (ref 4.22–5.81)
RDW: 12.2 % (ref 11.5–15.5)
WBC: 7.1 10*3/uL (ref 4.0–10.5)
nRBC: 0 % (ref 0.0–0.2)

## 2021-11-28 LAB — LACTATE DEHYDROGENASE: LDH: 115 U/L (ref 98–192)

## 2021-11-28 LAB — COMPREHENSIVE METABOLIC PANEL
ALT: 11 U/L (ref 0–44)
AST: 13 U/L — ABNORMAL LOW (ref 15–41)
Albumin: 2.6 g/dL — ABNORMAL LOW (ref 3.5–5.0)
Alkaline Phosphatase: 45 U/L (ref 38–126)
Anion gap: 6 (ref 5–15)
BUN: 25 mg/dL — ABNORMAL HIGH (ref 8–23)
CO2: 27 mmol/L (ref 22–32)
Calcium: 7.8 mg/dL — ABNORMAL LOW (ref 8.9–10.3)
Chloride: 104 mmol/L (ref 98–111)
Creatinine, Ser: 1.47 mg/dL — ABNORMAL HIGH (ref 0.61–1.24)
GFR, Estimated: 47 mL/min — ABNORMAL LOW (ref >60)
Glucose, Bld: 95 mg/dL (ref 70–99)
Potassium: 3.4 mmol/L — ABNORMAL LOW (ref 3.5–5.1)
Sodium: 137 mmol/L (ref 135–145)
Total Bilirubin: 0.5 mg/dL (ref 0.3–1.2)
Total Protein: 5.2 g/dL — ABNORMAL LOW (ref 6.5–8.1)

## 2021-11-28 LAB — C-REACTIVE PROTEIN: CRP: 12.2 mg/dL — ABNORMAL HIGH (ref <1.0)

## 2021-11-28 LAB — MAGNESIUM: Magnesium: 1.7 mg/dL (ref 1.7–2.4)

## 2021-11-28 LAB — D-DIMER, QUANTITATIVE: D-Dimer, Quant: <0.27 ug/mL-FEU (ref 0.00–0.50)

## 2021-11-28 MED ORDER — MIDODRINE HCL 5 MG PO TABS
5.0000 mg | ORAL_TABLET | Freq: Two times a day (BID) | ORAL | Status: DC
  Administered 2021-11-28 – 2021-11-29 (×3): 5 mg via ORAL
  Filled 2021-11-28 (×3): qty 1

## 2021-11-28 MED ORDER — MAGNESIUM SULFATE 2 GM/50ML IV SOLN
2.0000 g | Freq: Once | INTRAVENOUS | Status: AC
  Administered 2021-11-28: 2 g via INTRAVENOUS
  Filled 2021-11-28: qty 50

## 2021-11-28 MED ORDER — POTASSIUM CHLORIDE CRYS ER 20 MEQ PO TBCR
40.0000 meq | EXTENDED_RELEASE_TABLET | Freq: Once | ORAL | Status: AC
  Administered 2021-11-28: 40 meq via ORAL
  Filled 2021-11-28: qty 2

## 2021-11-28 NOTE — Progress Notes (Signed)
Progress Note   Patient: John [name] BLT:[id number] DOB: 04-Mar-1935 DOA: 11/25/2021     2 DOS: the patient was seen and examined on 11/28/2021   Brief hospital course: [name] is a 87 y.o. 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  Assessment and Plan: * COVID-19 virus infection- (present on admission) - CT value: 21.2 SARS-CoV-2 positive 0/0/7622 complicated by dehydration with hypotension and near syncope as well as generalized weakness.  Saturating well on room air without evidence of pneumonia/infiltrate on CXR. -Started on IV remdesivir, continue  Near syncope- (present on admission) Secondary to hypotension/dehydration due to poor oral intake in setting of COVID-19 viral infection. - s/p IV fluid hydration -PT/OT eval, will need ongoing PT at SNF  Autonomic postural hypotension- (present on admission) - he has been on max dose florinef with still ongoing hypoTN, syncope - going to try transition to midodrine and adjust as needed - keep holding amlodipine and spironolactone  CAD (coronary artery disease)- (present on admission) Stable, denies any chest pain.  Continue statin.  History of intracranial hemorrhage Continue home Keppra for seizure prophylaxis. CT head without contrast on admission negative for acute intracranial abnormality.  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 anxiety- (present on admission) Continue home Lexapro, Seroquel, alprazolam.  CKD stage G3a/A1, GFR 45-59 and albumin creatinine ratio <30 mg/g ([place])- (present on admission) Creatinine is slightly diminished compared to recent baseline.   - creat improved with IVF however more congested on lung exam today; d/c IVF and monitor -  encourage better oral intake       Subjective: Rested better some overnight. Son bedside this morning. Discussed changing the florinef to midodrine, they were okay with it.   Physical Exam: Vitals:   11/27/21 1813 11/27/21 2026 11/28/21 0608 11/28/21 1352  BP: 116/61 (!) 99/55 122/64 (!) 100/53  Pulse: (!) 104 78 68 69  Resp:  16 18 18   Temp:  99.4 F (37.4 C) 98.3 F (36.8 C) 98.5 F (36.9 C)  TempSrc:  Oral Oral Axillary  SpO2: 95% 94% 94% 93%  Weight:      Physical Exam Constitutional:      General: He is not in acute distress.    Appearance: Normal appearance.  HENT:     Head: Normocephalic and atraumatic.     Mouth/Throat:     Mouth: Mucous membranes are moist.  Eyes:     Extraocular Movements: Extraocular movements intact.  Cardiovascular:     Rate and Rhythm: Normal rate and regular rhythm.     Heart sounds: Normal heart sounds.  Pulmonary:     Effort: Pulmonary effort is normal. No respiratory distress.     Breath sounds: Normal breath sounds. No wheezing.  Abdominal:     General: Bowel sounds are normal. There is no distension.     Palpations: Abdomen is soft.     Tenderness: There is no abdominal tenderness.  Musculoskeletal:        General: Normal range of motion.     Cervical back: Normal range of motion and neck supple.  Skin:    General: Skin is warm and dry.  Neurological:     General: No focal deficit present.     Mental Status: He is alert.  Psychiatric:        Mood and  Affect: Mood normal.        Behavior: Behavior normal.     Data Reviewed:  I have Reviewed nursing notes, Vitals, and Lab results since pt's last encounter. Pertinent lab results creat 1.47, Na 137 I have ordered test including BMP, CBC, Mg I have reviewed the last note from all staff over past 24 hours,  I have discussed pt's care plan and test results with nursing staff, CM.   Family Communication: son  Disposition: Status is: Inpatient Remains inpatient appropriate  because: ongoing covid treatment and treatment for orthostasis    Planned Discharge Destination: Skilled nursing facility  DVT ppx: Lovenox     Author: Dwyane Dee, MD 11/28/2021 5:47 PM  For on call review [email or web address].

## 2021-11-29 DIAGNOSIS — I951 Orthostatic hypotension: Secondary | ICD-10-CM | POA: Diagnosis not present

## 2021-11-29 DIAGNOSIS — U071 COVID-19: Secondary | ICD-10-CM | POA: Diagnosis not present

## 2021-11-29 LAB — COMPREHENSIVE METABOLIC PANEL
ALT: 10 U/L (ref 0–44)
AST: 11 U/L — ABNORMAL LOW (ref 15–41)
Albumin: 2.6 g/dL — ABNORMAL LOW (ref 3.5–5.0)
Alkaline Phosphatase: 47 U/L (ref 38–126)
Anion gap: 7 (ref 5–15)
BUN: 25 mg/dL — ABNORMAL HIGH (ref 8–23)
CO2: 27 mmol/L (ref 22–32)
Calcium: 7.8 mg/dL — ABNORMAL LOW (ref 8.9–10.3)
Chloride: 102 mmol/L (ref 98–111)
Creatinine, Ser: 1.4 mg/dL — ABNORMAL HIGH (ref 0.61–1.24)
GFR, Estimated: 50 mL/min — ABNORMAL LOW (ref >60)
Glucose, Bld: 89 mg/dL (ref 70–99)
Potassium: 3.8 mmol/L (ref 3.5–5.1)
Sodium: 136 mmol/L (ref 135–145)
Total Bilirubin: 0.5 mg/dL (ref 0.3–1.2)
Total Protein: 5.2 g/dL — ABNORMAL LOW (ref 6.5–8.1)

## 2021-11-29 LAB — CBC WITH DIFFERENTIAL/PLATELET
Abs Immature Granulocytes: 0.02 10*3/uL (ref 0.00–0.07)
Basophils Absolute: 0 10*3/uL (ref 0.0–0.1)
Basophils Relative: 1 %
Eosinophils Absolute: 0.3 10*3/uL (ref 0.0–0.5)
Eosinophils Relative: 5 %
HCT: 34.2 % — ABNORMAL LOW (ref 39.0–52.0)
Hemoglobin: 11.4 g/dL — ABNORMAL LOW (ref 13.0–17.0)
Immature Granulocytes: 0 %
Lymphocytes Relative: 27 %
Lymphs Abs: 1.7 10*3/uL (ref 0.7–4.0)
MCH: 33.7 pg (ref 26.0–34.0)
MCHC: 33.3 g/dL (ref 30.0–36.0)
MCV: 101.2 fL — ABNORMAL HIGH (ref 80.0–100.0)
Monocytes Absolute: 0.6 10*3/uL (ref 0.1–1.0)
Monocytes Relative: 9 %
Neutro Abs: 3.7 10*3/uL (ref 1.7–7.7)
Neutrophils Relative %: 58 %
Platelets: 146 10*3/uL — ABNORMAL LOW (ref 150–400)
RBC: 3.38 MIL/uL — ABNORMAL LOW (ref 4.22–5.81)
RDW: 12.3 % (ref 11.5–15.5)
WBC: 6.4 10*3/uL (ref 4.0–10.5)
nRBC: 0 % (ref 0.0–0.2)

## 2021-11-29 LAB — LACTATE DEHYDROGENASE: LDH: 103 U/L (ref 98–192)

## 2021-11-29 LAB — MAGNESIUM: Magnesium: 2.1 mg/dL (ref 1.7–2.4)

## 2021-11-29 LAB — D-DIMER, QUANTITATIVE: D-Dimer, Quant: <0.27 ug/mL-FEU (ref 0.00–0.50)

## 2021-11-29 LAB — C-REACTIVE PROTEIN: CRP: 14.6 mg/dL — ABNORMAL HIGH (ref <1.0)

## 2021-11-29 MED ORDER — MIDODRINE HCL 5 MG PO TABS
5.0000 mg | ORAL_TABLET | Freq: Once | ORAL | Status: AC
  Administered 2021-11-29: 5 mg via ORAL
  Filled 2021-11-29 (×2): qty 1

## 2021-11-29 MED ORDER — MIDODRINE HCL 5 MG PO TABS
10.0000 mg | ORAL_TABLET | Freq: Two times a day (BID) | ORAL | Status: DC
  Administered 2021-11-29 – 2021-11-30 (×3): 10 mg via ORAL
  Filled 2021-11-29 (×5): qty 2

## 2021-11-29 NOTE — Progress Notes (Signed)
Progress Note   Patient: John [name]:[id number] DOB: Feb 01, 1935 DOA: 11/25/2021     3 DOS: the patient was seen and examined on 11/29/2021   Brief hospital course: John [name] is a 87 y.o. 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  Assessment and Plan: * COVID-19 virus infection- (present on admission) - CT value: 21.2 SARS-CoV-2 positive 0/11/5425 complicated by dehydration with hypotension and near syncope as well as generalized weakness.  Saturating well on room air without evidence of pneumonia/infiltrate on CXR. -Started on IV remdesivir, continue  Near syncope- (present on admission) Secondary to hypotension/dehydration due to poor oral intake in setting of COVID-19 viral infection. - s/p IV fluid hydration -PT/OT eval, will need ongoing PT at SNF  Autonomic postural hypotension- (present on admission) - he has been on max dose florinef with still ongoing hypoTN, syncope - going to try transition to midodrine and adjust as needed - keep holding amlodipine and spironolactone - still orthostatic positive on 2/5 and BP low - will increase midodrine further today   CAD (coronary artery disease)- (present on admission) Stable, denies any chest pain.  Continue statin.  History of intracranial hemorrhage Continue home Keppra for seizure prophylaxis. CT head without contrast on admission negative for acute intracranial abnormality.  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 anxiety- (present on admission) Continue home Lexapro, Seroquel, alprazolam.  CKD stage G3a/A1, GFR 45-59 and albumin creatinine ratio <30 mg/g ([place])- (present on admission) Creatinine is slightly diminished compared to recent baseline.   -  creat improved with IVF but developed some congestion on lung exam so fluids stopped  - encourage better oral intake       Subjective: Slept better. Eating breakfast this morning. Still has intermittent HA but treatment for it helps he says. Tentative plan is to work with PT more and possibly back to SNF by Tuesday.   Physical Exam: Vitals:   11/28/21 2040 11/29/21 0500 11/29/21 0638 11/29/21 1346  BP: 131/66  (!) 148/74 (!) 158/72  Pulse: (!) 57  72 79  Resp: 18  20 18   Temp: 97.8 F (36.6 C)  (!) 97.5 F (36.4 C) 98 F (36.7 C)  TempSrc: Oral  Oral Oral  SpO2: 96%  96% 93%  Weight:  81.9 kg    Physical Exam Constitutional:      General: He is not in acute distress.    Appearance: Normal appearance.  HENT:     Head: Normocephalic and atraumatic.     Mouth/Throat:     Mouth: Mucous membranes are moist.  Eyes:     Extraocular Movements: Extraocular movements intact.  Cardiovascular:     Rate and Rhythm: Normal rate and regular rhythm.     Heart sounds: Normal heart sounds.  Pulmonary:     Effort: Pulmonary effort is normal. No respiratory distress.     Breath sounds: Normal breath sounds. No wheezing.  Abdominal:     General: Bowel sounds are normal. There is no distension.     Palpations: Abdomen is soft.     Tenderness: There is no abdominal tenderness.  Musculoskeletal:        General: Normal range of motion.     Cervical back: Normal range of motion and neck supple.  Skin:    General: Skin is warm and dry.  Neurological:     General: No focal deficit present.     Mental Status: He is alert.  Psychiatric:        Mood and Affect: Mood normal.        Behavior: Behavior normal.     Data Reviewed:  I have Reviewed nursing notes, Vitals, and Lab results since pt's last encounter. Pertinent lab results creat 1.4, Na 136 I have ordered test including BMP, CBC, Mg I have reviewed the last note from all staff over past 24 hours,  I have discussed pt's care plan  and test results with nursing staff, CM.   Family Communication: son  Disposition: Status is: Inpatient Remains inpatient appropriate because: ongoing covid treatment and treatment for orthostasis    Planned Discharge Destination: Skilled nursing facility tentative for Tuesday after working with PT more  DVT ppx: Lovenox     Author: Dwyane Dee, MD 11/29/2021 2:09 PM  For on call review [email or web address].

## 2021-11-29 NOTE — Progress Notes (Signed)
Physical Therapy Treatment Patient Details Name: John [name] MRN: [id number] DOB: [birth year] Today's Date: 11/29/2021   History of Present Illness Patient is an 87 y.o. male who presented to the ED from his PCP office for evaluation of near syncope with hypotension. 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    PT Comments    Pt was able to ambulate in room today. O2 sats did stay 95-98% on RA, and no reports of dizziness, however could not tolerate a lot of activity at once. Movement seemed to be rigid, assistance needed to get to the edge of bed, and small steps with RW during ambulation, walking into the wall and things on the left side with assistance needed with RW and attention to adjust. Could only tolerate about 10 feet at a time and we worked on this 5x in the room today. Also difficulty getting back in the bed , unable to fully lift his legs back in the bed, again with rigid movement patterns and slow motor planning.  Tried to call his daughter [name]) to ask more about what assistance ALF could give him initially because he will need a lot of assistance for ALL mobility at this time. Or does the ALF provide a higher level of care until he can return to his apartment (?) . Will continue to work with pt while here in acute care.    Recommendations for follow up therapy are one component of a multi-disciplinary discharge planning process, led by the attending physician.  Recommendations may be updated based on patient status, additional functional criteria and insurance authorization.  Follow Up Recommendations  Home health PT (he will need a lot of assistance initially, unsure if ALF can provide that (? ) tried to call duaghter [name] 2x today)     Assistance Recommended at Discharge Frequent or constant Supervision/Assistance  Patient can return home with the  following A lot of help with walking and/or transfers;A lot of help with bathing/dressing/bathroom;Assistance with feeding;Direct supervision/assist for medications management   Equipment Recommendations       Recommendations for Other Services       Precautions / Restrictions Precautions Precautions: Fall     Mobility  Bed Mobility Overal bed mobility: Needs Assistance     Sidelying to sit: Mod assist, +2 for safety/equipment       General bed mobility comments: cues for pgrogessing and scooting hips to edge of bed. Pt seemed to be " frozen" with movement pattern to progress himself to the edge of the bed. Very rigid movements, and maxA for scooting hips to the edge.    Transfers Overall transfer level: Needs assistance Equipment used: Rolling walker (2 wheels) Transfers: Sit to/from Stand Sit to Stand: Min assist           General transfer comment: minA to boost from bed and sitting surface. , also when turning to approach chair to sit, he does sit prematurely before turning entire body resulting in sitting on edge and side of hip.    Ambulation/Gait Ambulation/Gait assistance: Min assist Gait Distance (Feet): 12 Feet (5 times in room.) Assistive device: Rolling walker (2 wheels) Gait Pattern/deviations: Step-through pattern, Decreased step length - left, Decreased step length - right, Decreased stride length, Trunk flexed Gait velocity: decr     General Gait Details: small rigid steps, seemed difficulty with motor planning and fluid movements, some festinating gait. Walked to window  and back, and sometimes to the window bench to sit and rest then back to the edge of the bed. Each time needing seated rest breaks, and also cues and assistance for RW guidance, kept hitting things on the left side and walking into the left wall, getting RW wheels stuck on things on Left needing assistance to adjust.   Stairs             Wheelchair Mobility    Modified Rankin  (Stroke Patients Only)       Balance Overall balance assessment: Needs assistance Sitting-balance support: Bilateral upper extremity supported, Feet supported Sitting balance-Leahy Scale: Fair     Standing balance support: During functional activity, Reliant on assistive device for balance, Bilateral upper extremity supported Standing balance-Leahy Scale: Poor                              Cognition Arousal/Alertness: Awake/alert Behavior During Therapy: WFL for tasks assessed/performed Overall Cognitive Status: Within Functional Limits for tasks assessed                                          Exercises General Exercises - Lower Extremity Ankle Circles/Pumps: AROM, Seated, 10 reps, Both Long Arc Quad: AROM, Seated, 10 reps, Both Hip Flexion/Marching: AROM, Both, Standing, 10 reps    General Comments        Pertinent Vitals/Pain Pain Assessment Pain Assessment: No/denies pain    Home Living                          Prior Function            PT Goals (current goals can now be found in the care plan section) Acute Rehab PT Goals Patient Stated Goal: stop being dizzy, get back to independence at ALF PT Goal Formulation: With patient/family Time For Goal Achievement: 12/10/21 Potential to Achieve Goals: Good Progress towards PT goals: Progressing toward goals    Frequency    Min 3X/week      PT Plan Current plan remains appropriate    Co-evaluation              AM-PAC PT "6 Clicks" Mobility   Outcome Measure  Help needed turning from your back to your side while in a flat bed without using bedrails?: A Lot Help needed moving from lying on your back to sitting on the side of a flat bed without using bedrails?: A Lot Help needed moving to and from a bed to a chair (including a wheelchair)?: A Lot Help needed standing up from a chair using your arms (e.g., wheelchair or bedside chair)?: A Little Help needed to  walk in hospital room?: A Lot Help needed climbing 3-5 steps with a railing? : A Lot 6 Click Score: 13    End of Session Equipment Utilized During Treatment: Gait belt Activity Tolerance: Patient tolerated treatment well Patient left: in bed;with bed alarm set;with nursing/sitter in room Nurse Communication: Mobility status PT Visit Diagnosis: Muscle weakness (generalized) (M62.81);Difficulty in walking, not elsewhere classified (R26.2);Other symptoms and signs involving the nervous system (R29.898);Other abnormalities of gait and mobility (R26.89)     Time: 1500-1530 PT Time Calculation (min) (ACUTE ONLY): 30 min  Charges:  $Gait Training: 8-22 mins $Therapeutic Exercise: 8-22 mins  Gatha Mayer, PT, MPT Acute Rehabilitation Services Office: [phone] Pager: [phone] 11/29/2021    Clide Dales 11/29/2021, 4:21 PM

## 2021-11-30 DIAGNOSIS — I951 Orthostatic hypotension: Secondary | ICD-10-CM | POA: Diagnosis not present

## 2021-11-30 DIAGNOSIS — U071 COVID-19: Secondary | ICD-10-CM | POA: Diagnosis not present

## 2021-11-30 LAB — COMPREHENSIVE METABOLIC PANEL
ALT: 10 U/L (ref 0–44)
AST: 13 U/L — ABNORMAL LOW (ref 15–41)
Albumin: 2.5 g/dL — ABNORMAL LOW (ref 3.5–5.0)
Alkaline Phosphatase: 44 U/L (ref 38–126)
Anion gap: 5 (ref 5–15)
BUN: 25 mg/dL — ABNORMAL HIGH (ref 8–23)
CO2: 24 mmol/L (ref 22–32)
Calcium: 7.6 mg/dL — ABNORMAL LOW (ref 8.9–10.3)
Chloride: 108 mmol/L (ref 98–111)
Creatinine, Ser: 1.19 mg/dL (ref 0.61–1.24)
GFR, Estimated: >60 mL/min (ref >60)
Glucose, Bld: 91 mg/dL (ref 70–99)
Potassium: 4 mmol/L (ref 3.5–5.1)
Sodium: 137 mmol/L (ref 135–145)
Total Bilirubin: 0.6 mg/dL (ref 0.3–1.2)
Total Protein: 5 g/dL — ABNORMAL LOW (ref 6.5–8.1)

## 2021-11-30 LAB — CBC WITH DIFFERENTIAL/PLATELET
Abs Immature Granulocytes: 0.02 10*3/uL (ref 0.00–0.07)
Basophils Absolute: 0 10*3/uL (ref 0.0–0.1)
Basophils Relative: 0 %
Eosinophils Absolute: 0.3 10*3/uL (ref 0.0–0.5)
Eosinophils Relative: 6 %
HCT: 33.2 % — ABNORMAL LOW (ref 39.0–52.0)
Hemoglobin: 11.3 g/dL — ABNORMAL LOW (ref 13.0–17.0)
Immature Granulocytes: 0 %
Lymphocytes Relative: 29 %
Lymphs Abs: 1.6 10*3/uL (ref 0.7–4.0)
MCH: 33.7 pg (ref 26.0–34.0)
MCHC: 34 g/dL (ref 30.0–36.0)
MCV: 99.1 fL (ref 80.0–100.0)
Monocytes Absolute: 0.5 10*3/uL (ref 0.1–1.0)
Monocytes Relative: 8 %
Neutro Abs: 3.1 10*3/uL (ref 1.7–7.7)
Neutrophils Relative %: 57 %
Platelets: 148 10*3/uL — ABNORMAL LOW (ref 150–400)
RBC: 3.35 MIL/uL — ABNORMAL LOW (ref 4.22–5.81)
RDW: 12.1 % (ref 11.5–15.5)
WBC: 5.5 10*3/uL (ref 4.0–10.5)
nRBC: 0 % (ref 0.0–0.2)

## 2021-11-30 LAB — CULTURE, BLOOD (ROUTINE X 2)
Culture: NO GROWTH
Culture: NO GROWTH
Special Requests: ADEQUATE
Special Requests: ADEQUATE

## 2021-11-30 LAB — D-DIMER, QUANTITATIVE: D-Dimer, Quant: <0.27 ug/mL-FEU (ref 0.00–0.50)

## 2021-11-30 LAB — MAGNESIUM: Magnesium: 1.9 mg/dL (ref 1.7–2.4)

## 2021-11-30 LAB — LACTATE DEHYDROGENASE: LDH: 142 U/L (ref 98–192)

## 2021-11-30 LAB — C-REACTIVE PROTEIN: CRP: 13.8 mg/dL — ABNORMAL HIGH (ref <1.0)

## 2021-11-30 NOTE — TOC Progression Note (Signed)
Transition of Care [facility]) - Progression Note    Patient Details  Name: [name] MRN: [id number] Date of Birth: 10/06/1935  Transition of [facility]) CM/SW [place], [place] Phone Number: 11/30/2021, 1:59 PM  Clinical Narrative:  Patient now requiring additional help for transfers, ambulation.  I FAXed PT, OT notes to Porsche at [facility] for her review.  She will let me know if they feel like they can handle him. TOC will continue to follow during the course of hospitalization.      Expected Discharge Plan:  (TBD) Barriers to Discharge: No Barriers Identified  Expected Discharge Plan and Services Expected Discharge Plan:  (TBD)       Living arrangements for the past 2 months: Assisted Living Facility                                       Social Determinants of Health (SDOH) Interventions    Readmission Risk Interventions No flowsheet data found.

## 2021-11-30 NOTE — Progress Notes (Signed)
Physical Therapy Treatment Patient Details Name: John [name] MRN: [id number] DOB: 1935-08-21 Today's Date: 11/30/2021   History of Present Illness Patient is an 87 y.o. male who presented to the ED from his PCP office for evaluation of near syncope with hypotension, found to be Covid-19 positive. 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    PT Comments    Pt requiring assist to mobilize and cues for safety.  Pt from ALF and uncertain if they can provide current level of assist so updated d/c recommendation to SNF in case ALF is unable to provide.  Pt was attempting to get OOB alone in room on arrival (bed alarming while therapist outside door, pt did wait for therapist to come in and assist as requested while therapist donning PPE at door).     Recommendations for follow up therapy are one component of a multi-disciplinary discharge planning process, led by the attending physician.  Recommendations may be updated based on patient status, additional functional criteria and insurance authorization.  Follow Up Recommendations  Skilled nursing-short term rehab (<3 hours/day)     Assistance Recommended at Discharge Frequent or constant Supervision/Assistance  Patient can return home with the following A lot of help with walking and/or transfers;A lot of help with bathing/dressing/bathroom;Assistance with feeding;Direct supervision/assist for medications management   Equipment Recommendations  None recommended by PT    Recommendations for Other Services       Precautions / Restrictions Precautions Precautions: Fall Restrictions Weight Bearing Restrictions: No     Mobility  Bed Mobility Overal bed mobility: Needs Assistance Bed Mobility: Sit to Supine       Sit to supine: Mod assist   General bed mobility comments: pt setting off bed alarm attempting  to get OOB to go to bathroom on arrival, required assist for LEs onto bed    Transfers Overall transfer level: Needs assistance Equipment used: Rolling walker (2 wheels) Transfers: Sit to/from Stand Sit to Stand: Min assist           General transfer comment: assist to rise and steady, cues for hand placement, assist to control descent has pt tends to sit prematurely    Ambulation/Gait Ambulation/Gait assistance: Min assist Gait Distance (Feet): 10 Feet (x2) Assistive device: Rolling walker (2 wheels) Gait Pattern/deviations: Step-through pattern, Decreased stride length, Trunk flexed Gait velocity: decr     General Gait Details: small rigid steps, difficulty performing bigger steps even with cues, ambulated to/from bathroom has pt needed to void and have BM, pt required assist for completing pericare   Stairs             Wheelchair Mobility    Modified Rankin (Stroke Patients Only)       Balance Overall balance assessment: Needs assistance         Standing balance support: During functional activity, Reliant on assistive device for balance, Bilateral upper extremity supported Standing balance-Leahy Scale: Poor                              Cognition Arousal/Alertness: Awake/alert Behavior During Therapy: WFL for tasks assessed/performed                                   General Comments: pt with little verbalizations, follows commands, appears appropriate  Exercises      General Comments        Pertinent Vitals/Pain Pain Assessment Pain Assessment: No/denies pain Pain Intervention(s): Monitored during session, Repositioned    Home Living                          Prior Function            PT Goals (current goals can now be found in the care plan section) Progress towards PT goals: Progressing toward goals    Frequency    Min 3X/week      PT Plan Discharge plan needs to be updated     Co-evaluation              AM-PAC PT "6 Clicks" Mobility   Outcome Measure  Help needed turning from your back to your side while in a flat bed without using bedrails?: A Lot Help needed moving from lying on your back to sitting on the side of a flat bed without using bedrails?: A Lot Help needed moving to and from a bed to a chair (including a wheelchair)?: A Lot Help needed standing up from a chair using your arms (e.g., wheelchair or bedside chair)?: A Lot Help needed to walk in hospital room?: A Lot Help needed climbing 3-5 steps with a railing? : A Lot 6 Click Score: 12    End of Session Equipment Utilized During Treatment: Gait belt Activity Tolerance: Patient tolerated treatment well Patient left: in bed;with bed alarm set;with nursing/sitter in room;with call bell/phone within reach Nurse Communication: Mobility status PT Visit Diagnosis: Muscle weakness (generalized) (M62.81);Difficulty in walking, not elsewhere classified (R26.2);Other abnormalities of gait and mobility (R26.89)     Time: 9311-2162 PT Time Calculation (min) (ACUTE ONLY): 16 min  Charges:  $Gait Training: 8-22 mins                     Jannette Spanner PT, DPT Acute Rehabilitation Services Pager: [phone] Office: [place] 11/30/2021, 4:28 PM

## 2021-11-30 NOTE — Progress Notes (Signed)
Occupational Therapy Treatment Patient Details Name: John [name] MRN: [id number] DOB: [birth year] Today's Date: 11/30/2021   History of present illness Patient is an 87 y.o. male who presented to the ED from his PCP office for evaluation of near syncope with hypotension. 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   OT comments  Patient's son was in room reporting that patient did not need current level of assistance at ALF. Patients son reported sister was getting in contact with ALF today to determine what level they can support there. Patient was mod A for transfer from edge of bed to recliner in room with consistent cues for sequencing and initiation of tasks. Patient was max A for supine to sit on edge of bed with increased time. Patient attempted to engage in Adls EOB with noted posterior leaning with mod A to regain sitting balance on edge of bed. Patient would continue to benefit from skilled OT services at this time while admitted and after d/c to address noted deficits in order to improve overall safety and independence in ADLs.     Recommendations for follow up therapy are one component of a multi-disciplinary discharge planning process, led by the attending physician.  Recommendations may be updated based on patient status, additional functional criteria and insurance authorization.    Follow Up Recommendations  Home health OT    Assistance Recommended at Discharge Frequent or constant Supervision/Assistance  Patient can return home with the following  A little help with walking and/or transfers;A little help with bathing/dressing/bathroom   Equipment Recommendations  None recommended by OT    Recommendations for Other Services      Precautions / Restrictions Precautions Precautions: Fall Restrictions Weight Bearing Restrictions: No        Mobility Bed Mobility Overal bed mobility: Needs Assistance     Sidelying to sit: Max assist       General bed mobility comments: patient needed physical assistance for BLE and trunk control to transition to edge of bed with posterior leaning and long pauses with continued cues for sequencing need.    Transfers                         Balance Overall balance assessment: Needs assistance Sitting-balance support: Bilateral upper extremity supported, Feet supported Sitting balance-Leahy Scale: Fair     Standing balance support: During functional activity, Reliant on assistive device for balance, Bilateral upper extremity supported Standing balance-Leahy Scale: Poor                             ADL either performed or assessed with clinical judgement   ADL Overall ADL's : Needs assistance/impaired     Grooming: Minimal assistance;Set up;Sitting Grooming Details (indicate cue type and reason): in recliner with increased time and assist to seqeunce task. Upper Body Bathing: Minimal assistance;Sitting Upper Body Bathing Details (indicate cue type and reason): EOB with mod A to maitnain sitting balance at times with cues to lean forwards.     Upper Body Dressing : Minimal assistance;Sitting Upper Body Dressing Details (indicate cue type and reason): sitting EOB with cues to keep weight shifted forwards.       Toilet Transfer Details (indicate cue type and reason): patient was able to transfer from edge of bed to recliner with mod A with RW with noted small shuffle steps.  patient needed cues for seqeucning and intiation of task. patients son present reporting that he thinks ALF can assist at current level. patient declined to use commode at this time,                Extremity/Trunk Assessment              Vision       Perception     Praxis      Cognition Arousal/Alertness: Awake/alert Behavior During Therapy: WFL for tasks  assessed/performed Overall Cognitive Status: Difficult to assess                                 General Comments: son was in room at this time.        Exercises      Shoulder Instructions       General Comments      Pertinent Vitals/ Pain       Pain Assessment Pain Assessment: No/denies pain  Home Living                                          Prior Functioning/Environment              Frequency  Min 2X/week        Progress Toward Goals  OT Goals(current goals can now be found in the care plan section)  Progress towards OT goals: Progressing toward goals     Plan Discharge plan remains appropriate    Co-evaluation                 AM-PAC OT "6 Clicks" Daily Activity     Outcome Measure   Help from another person eating meals?: None Help from another person taking care of personal grooming?: A Little Help from another person toileting, which includes using toliet, bedpan, or urinal?: A Little Help from another person bathing (including washing, rinsing, drying)?: A Little Help from another person to put on and taking off regular upper body clothing?: A Little Help from another person to put on and taking off regular lower body clothing?: A Little 6 Click Score: 19    End of Session Equipment Utilized During Treatment: Rolling walker (2 wheels);Gait belt  OT Visit Diagnosis: Unsteadiness on feet (R26.81);Muscle weakness (generalized) (M62.81);Pain   Activity Tolerance Patient tolerated treatment well   Patient Left in chair;with call bell/phone within reach;with family/visitor present;with chair alarm set   Nurse Communication Mobility status;Patient requests pain meds (headache)        Time: 6629-4765 OT Time Calculation (min): 18 min  Charges: OT General Charges $OT Visit: 1 Visit OT Treatments $Self Care/Home Management : 8-22 mins  Jackelyn Poling OTR/L, MS Acute Rehabilitation Department Office#  [phone] Pager# [phone]   Marcellina Millin 11/30/2021, 12:39 PM

## 2021-11-30 NOTE — Progress Notes (Signed)
Progress Note   Patient: John [name] EUM:[id number] DOB: 05-05-1935 DOA: 11/25/2021     4 DOS: the patient was seen and examined on 11/30/2021   Brief hospital course: John [name] is a 87 y.o. 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  Assessment and Plan: * COVID-19 virus infection- (present on admission) - CT value: 21.2 SARS-CoV-2 positive 02/25/85 complicated by dehydration with hypotension and near syncope as well as generalized weakness.  Saturating well on room air without evidence of pneumonia/infiltrate on CXR. -Completed remdesivir course  Near syncope- (present on admission) Secondary to hypotension/dehydration due to poor oral intake in setting of COVID-19 viral infection. - s/p IV fluid hydration -PT following  Autonomic postural hypotension- (present on admission) - he has been on max dose florinef with still ongoing hypoTN, syncope - going to try transition to midodrine and adjust as needed - keep holding amlodipine and spironolactone - still orthostatic positive on 2/5 and BP low -Midodrine being adjusted as needed  CAD (coronary artery disease)- (present on admission) Stable, denies any chest pain.  Continue statin.  History of intracranial hemorrhage Continue home Keppra for seizure prophylaxis. CT head without contrast on admission negative for acute intracranial abnormality.  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 anxiety- (present on admission) Continue home Lexapro, Seroquel, alprazolam.  CKD stage G3a/A1, GFR 45-59 and albumin creatinine ratio <30 mg/g ([place])- (present on admission) Creatinine is slightly diminished compared to recent baseline.   - creat improved with IVF but developed some  congestion on lung exam so fluids stopped  - encourage better oral intake       Subjective: Feeling a little better today.  Still weak/deconditioned.  He is from assisted living and we are clarifying if they will be able to accept patient back with current functional status.  Physical Exam: Vitals:   11/29/21 2140 11/30/21 0500 11/30/21 0614 11/30/21 1450  BP: 130/69  (!) 154/71 (!) 146/77  Pulse: 96  64 76  Resp: 18  18 (!) 26  Temp: (!) 97.5 F (36.4 C)  (!) 97.4 F (36.3 C) 98.1 F (36.7 C)  TempSrc: Oral  Oral Oral  SpO2: 92%  94% 93%  Weight: 90 kg 80 kg    Height: (!) 9' 10.11" (3 m)     Physical Exam Constitutional:      General: He is not in acute distress.    Appearance: Normal appearance.  HENT:     Head: Normocephalic and atraumatic.     Mouth/Throat:     Mouth: Mucous membranes are moist.  Eyes:     Extraocular Movements: Extraocular movements intact.  Cardiovascular:     Rate and Rhythm: Normal rate and regular rhythm.     Heart sounds: Normal heart sounds.  Pulmonary:     Effort: Pulmonary effort is normal. No respiratory distress.     Breath sounds: No wheezing.     Comments: Coarse breath sounds bilaterally  Abdominal:     General: Bowel sounds are normal. There is no distension.     Palpations: Abdomen is soft.     Tenderness: There is no abdominal tenderness.  Musculoskeletal:        General: Normal range of motion.     Cervical back: Normal range of motion and neck supple.  Skin:  General: Skin is warm and dry.  Neurological:     General: No focal deficit present.     Mental Status: He is alert.  Psychiatric:        Mood and Affect: Mood normal.        Behavior: Behavior normal.     Data Reviewed:  I have Reviewed nursing notes, Vitals, and Lab results since pt's last encounter. Pertinent lab results creat 1.19, Na 137 I have ordered test including BMP, CBC, Mg I have reviewed the last note from all staff over past 24 hours,  I have  discussed pt's care plan and test results with nursing staff, CM.   Family Communication: son  Disposition: Status is: Inpatient Remains inpatient appropriate because: ongoing covid treatment and treatment for orthostasis    Planned Discharge Destination:  ALF if able to accept back, tentative for Tues or Wed  DVT ppx: Lovenox     Author: Dwyane Dee, MD 11/30/2021 3:05 PM  For on call review [email or web address].

## 2021-11-30 NOTE — Care Management Important Message (Signed)
Important Message  Patient Details IM Letter given to the Patient Name: DAYON WITT MRN: [id number] Date of Birth: [birth year]   Medicare Important Message Given:  Yes     Kerin Salen 11/30/2021, 10:49 AM

## 2021-11-30 NOTE — Progress Notes (Signed)
°   11/30/21 1450  Assess: MEWS Score  Temp 98.1 F (36.7 C)  BP (!) 146/77  Pulse Rate 76  Resp (!) 26  SpO2 93 %  Assess: MEWS Score  MEWS Temp 0  MEWS Systolic 0  MEWS Pulse 0  MEWS RR 2  MEWS LOC 0  MEWS Score 2  MEWS Score Color Yellow  Assess: if the MEWS score is Yellow or Red  Were vital signs taken at a resting state? Yes  Focused Assessment No change from prior assessment  Does the patient meet 2 or more of the SIRS criteria? No  Take Vital Signs  Increase Vital Sign Frequency  Yellow: Q 2hr X 2 then Q 4hr X 2, if remains yellow, continue Q 4hrs  Escalate  MEWS: Escalate Yellow: discuss with charge nurse/RN and consider discussing with provider and RRT  Notify: Charge Nurse/RN  Name of Charge Nurse/RN Notified Pamala Hurry RN  Date Charge Nurse/RN Notified 11/30/21  Time Charge Nurse/RN Notified 1450  Document  Patient Outcome Other (Comment) (will monitor)  Progress note created (see row info) Yes  Assess: SIRS CRITERIA  SIRS Temperature  0  SIRS Pulse 0  SIRS Respirations  1  SIRS WBC 0  SIRS Score Sum  1

## 2021-12-01 ENCOUNTER — Ambulatory Visit: Payer: PPO | Admitting: Internal Medicine

## 2021-12-01 LAB — COMPREHENSIVE METABOLIC PANEL
ALT: 8 U/L (ref 0–44)
AST: 9 U/L — ABNORMAL LOW (ref 15–41)
Albumin: 2.7 g/dL — ABNORMAL LOW (ref 3.5–5.0)
Alkaline Phosphatase: 47 U/L (ref 38–126)
Anion gap: 5 (ref 5–15)
BUN: 24 mg/dL — ABNORMAL HIGH (ref 8–23)
CO2: 25 mmol/L (ref 22–32)
Calcium: 8 mg/dL — ABNORMAL LOW (ref 8.9–10.3)
Chloride: 109 mmol/L (ref 98–111)
Creatinine, Ser: 1.26 mg/dL — ABNORMAL HIGH (ref 0.61–1.24)
GFR, Estimated: 56 mL/min — ABNORMAL LOW (ref >60)
Glucose, Bld: 90 mg/dL (ref 70–99)
Potassium: 3.8 mmol/L (ref 3.5–5.1)
Sodium: 139 mmol/L (ref 135–145)
Total Bilirubin: 0.5 mg/dL (ref 0.3–1.2)
Total Protein: 5.1 g/dL — ABNORMAL LOW (ref 6.5–8.1)

## 2021-12-01 LAB — CBC WITH DIFFERENTIAL/PLATELET
Abs Immature Granulocytes: 0.02 10*3/uL (ref 0.00–0.07)
Basophils Absolute: 0 10*3/uL (ref 0.0–0.1)
Basophils Relative: 1 %
Eosinophils Absolute: 0.3 10*3/uL (ref 0.0–0.5)
Eosinophils Relative: 6 %
HCT: 33.5 % — ABNORMAL LOW (ref 39.0–52.0)
Hemoglobin: 11.4 g/dL — ABNORMAL LOW (ref 13.0–17.0)
Immature Granulocytes: 1 %
Lymphocytes Relative: 37 %
Lymphs Abs: 1.6 10*3/uL (ref 0.7–4.0)
MCH: 33.3 pg (ref 26.0–34.0)
MCHC: 34 g/dL (ref 30.0–36.0)
MCV: 98 fL (ref 80.0–100.0)
Monocytes Absolute: 0.5 10*3/uL (ref 0.1–1.0)
Monocytes Relative: 11 %
Neutro Abs: 2 10*3/uL (ref 1.7–7.7)
Neutrophils Relative %: 44 %
Platelets: 182 10*3/uL (ref 150–400)
RBC: 3.42 MIL/uL — ABNORMAL LOW (ref 4.22–5.81)
RDW: 12 % (ref 11.5–15.5)
WBC: 4.4 10*3/uL (ref 4.0–10.5)
nRBC: 0 % (ref 0.0–0.2)

## 2021-12-01 LAB — MAGNESIUM: Magnesium: 1.9 mg/dL (ref 1.7–2.4)

## 2021-12-01 LAB — C-REACTIVE PROTEIN: CRP: 9.6 mg/dL — ABNORMAL HIGH (ref <1.0)

## 2021-12-01 LAB — LACTATE DEHYDROGENASE: LDH: 100 U/L (ref 98–192)

## 2021-12-01 LAB — D-DIMER, QUANTITATIVE: D-Dimer, Quant: <0.27 ug/mL-FEU (ref 0.00–0.50)

## 2021-12-01 MED ORDER — MIDODRINE HCL 5 MG PO TABS
10.0000 mg | ORAL_TABLET | Freq: Three times a day (TID) | ORAL | Status: DC
  Administered 2021-12-01 – 2021-12-03 (×5): 10 mg via ORAL
  Filled 2021-12-01 (×6): qty 2

## 2021-12-01 NOTE — Progress Notes (Incomplete)
[facility]                                                                               John [name], is a 87 y.o. male, DOB - 1935/05/31, FTD:[id number] Admit date - 11/25/2021    Outpatient Primary MD for the patient is Unk Pinto, MD  LOS - 5  days   Chief Complaint  Patient presents with   Near Syncope       Brief summary   John [name] is a 87 y.o. 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   Assessment & Plan    Assessment and Plan: * COVID-19 virus infection- (present on admission) - CT value: 21.2 SARS-CoV-2 positive 0/03/2375 complicated by dehydration with hypotension and near syncope as well as generalized weakness.  Saturating well on room air without evidence of pneumonia/infiltrate on CXR. -Completed remdesivir course  CAD (coronary artery disease)- (present on admission) Stable, denies any chest pain.  Continue statin.  Near syncope- (present on admission) Secondary to hypotension/dehydration due to poor oral intake in setting of COVID-19 viral infection. - s/p IV fluid hydration -PT following  History of intracranial hemorrhage Continue home Keppra for seizure prophylaxis. CT head without contrast on admission negative for acute intracranial abnormality.  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 anxiety- (present on admission) Continue home Lexapro, Seroquel, alprazolam.  Autonomic postural hypotension- (present on admission) - he has been on max dose florinef with still ongoing hypoTN, syncope - going to try transition to midodrine and adjust as needed - keep holding amlodipine and spironolactone - continue daily orthostatics (still orthostatic) - continue midodrine  (only hold if SBP>160) -Midodrine being adjusted as needed  CKD stage G3a/A1, GFR 45-59 and albumin creatinine ratio <30 mg/g ([place])- (present on admission) Creatinine is slightly diminished compared to recent baseline.   - creat improved with IVF but developed some congestion on lung exam so fluids stopped  - encourage better oral intake             RN Pressure Injury Documentation:    Malnutrition Type:      Malnutrition Characteristics:      Nutrition Interventions:     Estimated body mass index is 9.13 kg/m as calculated from the following:   Height as of this encounter: 9' 10.11" (3 m).   Weight as of this encounter: 82.2 kg.  Code Status: *** DVT Prophylaxis:  enoxaparin (LOVENOX) injection 40 mg Start: 11/25/21 2300   Level of Care: Level of care: Telemetry Family Communication: Discussed all imaging results, lab results, explained to the patient or *   Disposition Plan:     Status is: Inpatient {Inpatient:23812}          Time Spent in minutes   ***   Procedures:  ***  Consultants:   ***  Antimicrobials:   Anti-infectives (From admission, onward)    Start     Dose/Rate Route Frequency Ordered Stop   11/26/21 1000  remdesivir 100 mg in sodium  chloride 0.9 % 100 mL IVPB        100 mg 200 mL/hr over 30 Minutes Intravenous Daily 11/25/21 1845 11/29/21 1035   11/25/21 2200  nirmatrelvir/ritonavir EUA (renal dosing) (PAXLOVID) 2 tablet  Status:  Discontinued        2 tablet Oral 2 times daily 11/25/21 1824 11/25/21 1825   11/25/21 2200  molnupiravir EUA (LAGEVRIO) capsule 800 mg  Status:  Discontinued        4 capsule Oral 2 times daily 11/25/21 1827 11/25/21 1833   11/25/21 2000  remdesivir 200 mg in sodium chloride 0.9% 250 mL IVPB        200 mg 580 mL/hr over 30 Minutes Intravenous Once 11/25/21 1845 11/26/21 0012            Subjective:   John [name] was seen and examined today.  *** Patient denies dizziness, chest  pain, shortness of breath, abdominal pain, N/V/D/C, new weakness, numbess, tingling. No acute events overnight.    Objective:   Vitals:   12/01/21 0400 12/01/21 1234 12/01/21 1236 12/01/21 1239  BP:  (!) 157/68 (!) 159/75 96/66  Pulse: (!) 45 (!) 54 60 80  Resp:  18 20 20   Temp:  97.6 F (36.4 C)    TempSrc:  Oral    SpO2: 95% 95% 91% 97%  Weight:      Height:        Intake/Output Summary (Last 24 hours) at 12/01/2021 1644 Last data filed at 12/01/2021 0102 Gross per 24 hour  Intake 210 ml  Output 350 ml  Net -140 ml   Filed Weights   11/29/21 2140 11/30/21 0500 12/01/21 0251  Weight: 90 kg 80 kg 82.2 kg     Exam General: Alert and oriented x 3, NAD Cardiovascular: S1 S2 auscultated, no murmurs, RRR Respiratory: Clear to auscultation bilaterally, no wheezing, rales or rhonchi Gastrointestinal: Soft, nontender, nondistended, + bowel sounds Ext: no pedal edema bilaterally Neuro: AAOx3, Cr N's II- XII. Strength 5/5 upper and lower extremities bilaterally Musculoskeletal: No digital cyanosis, clubbing Skin: No rashes Psych: Normal affect and demeanor, alert and oriented x3    Data Reviewed:  I have personally reviewed following labs and imaging studies   CBC Lab Results  Component Value Date   WBC 4.4 12/01/2021   RBC 3.42 (L) 12/01/2021   HGB 11.4 (L) 12/01/2021   HCT 33.5 (L) 12/01/2021   MCV 98.0 12/01/2021   MCH 33.3 12/01/2021   PLT 182 12/01/2021   MCHC 34.0 12/01/2021   RDW 12.0 12/01/2021   LYMPHSABS 1.6 12/01/2021   MONOABS 0.5 12/01/2021   EOSABS 0.3 12/01/2021   BASOSABS 0.0 72/53/6644     Last metabolic panel Lab Results  Component Value Date   NA 139 12/01/2021   K 3.8 12/01/2021   CL 109 12/01/2021   CO2 25 12/01/2021   BUN 24 (H) 12/01/2021   CREATININE 1.26 (H) 12/01/2021   GLUCOSE 90 12/01/2021   GFRNONAA 56 (L) 12/01/2021   GFRAA 61 07/24/2020   CALCIUM 8.0 (L) 12/01/2021   PHOS 3.1 01/19/2021   PROT 5.1 (L) 12/01/2021   ALBUMIN  2.7 (L) 12/01/2021   BILITOT 0.5 12/01/2021   ALKPHOS 47 12/01/2021   AST 9 (L) 12/01/2021   ALT 8 12/01/2021   ANIONGAP 5 12/01/2021    CBG (last 3)  No results for input(s): GLUCAP in the last 72 hours.   GFR: Estimated Creatinine Clearance: 50.7 mL/min (A) (by C-G formula based on SCr  of 1.26 mg/dL (H)).  Coagulation Profile: No results for input(s): INR, PROTIME in the last 168 hours.  Recent Results (from the past 240 hour(s))  Blood Culture (routine x 2)     Status: None   Collection Time: 11/25/21  4:42 PM   Specimen: BLOOD  Result Value Ref Range Status   Specimen Description   Final    BLOOD RIGHT ANTECUBITAL Performed at [facility] [address] [place], [place] [address]    Special Requests   Final    BOTTLES DRAWN AEROBIC AND ANAEROBIC Blood Culture adequate volume Performed at [facility] [address], [place], [place] [address]    Culture   Final    NO GROWTH 5 DAYS Performed at [facility], [place] [address] [place], [place] [address]    Report Status 11/30/2021 FINAL  Final  Blood Culture (routine x 2)     Status: None   Collection Time: 11/25/21  4:48 PM   Specimen: BLOOD  Result Value Ref Range Status   Specimen Description   Final    BLOOD LEFT ANTECUBITAL Performed at [facility] [address], [place], [place] [address]    Special Requests   Final    BOTTLES DRAWN AEROBIC AND ANAEROBIC Blood Culture adequate volume Performed at [facility] [address], [place], [place] [address]    Culture   Final    NO GROWTH 5 DAYS Performed at [facility], [place] [address] [place], [place] [address]    Report Status 11/30/2021 FINAL  Final  Resp Panel by RT-PCR (Flu A&B, Covid) Nasopharyngeal Swab     Status: Abnormal   Collection Time: 11/25/21  5:10 PM   Specimen: Nasopharyngeal Swab; Nasopharyngeal(NP) swabs in vial transport medium  Result Value Ref  Range Status   SARS Coronavirus 2 by RT PCR POSITIVE (A) NEGATIVE Final    Comment: (NOTE) SARS-CoV-2 target nucleic acids are DETECTED.  The SARS-CoV-2 RNA is generally detectable in upper respiratory specimens during the acute phase of infection. Positive results are indicative of the presence of the identified virus, but do not rule out bacterial infection or co-infection with other pathogens not detected by the test. Clinical correlation with patient history and other diagnostic information is necessary to determine patient infection status. The expected result is Negative.  Fact Sheet for Patients: EntrepreneurPulse.com.au  Fact Sheet for Healthcare Providers: IncredibleEmployment.be  This test is not yet approved or cleared by the [place] FDA and  has been authorized for detection and/or diagnosis of SARS-CoV-2 by FDA under an Emergency Use Authorization (EUA).  This EUA will remain in effect (meaning this test can be used) for the duration of  the COVID-19 declaration under Section 564(b)(1) of the A ct, 21 U.S.C. section 360bbb-3(b)(1), unless the authorization is terminated or revoked sooner.     Influenza A by PCR NEGATIVE NEGATIVE Final   Influenza B by PCR NEGATIVE NEGATIVE Final    Comment: (NOTE) The Xpert Xpress SARS-CoV-2/FLU/RSV plus assay is intended as an aid in the diagnosis of influenza from Nasopharyngeal swab specimens and should not be used as a sole basis for treatment. Nasal washings and aspirates are unacceptable for Xpert Xpress SARS-CoV-2/FLU/RSV testing.  Fact Sheet for Patients: EntrepreneurPulse.com.au  Fact Sheet for Healthcare Providers: IncredibleEmployment.be  This test is not yet approved or cleared by the [place] FDA and has been authorized for detection and/or diagnosis of SARS-CoV-2 by FDA under  an Emergency Use Authorization (EUA). This EUA will  remain in effect (meaning this test can be used) for the duration of the COVID-19 declaration under Section 564(b)(1) of the Act, 21 U.S.C. section 360bbb-3(b)(1), unless the authorization is terminated or revoked.  Performed at [facility], [place] [address], [place],  [address]         Radiology Studies: No results found.   Medications  Scheduled Meds:  ALPRAZolam  0.5 mg Oral Daily   atorvastatin  40 mg Oral QHS   enoxaparin (LOVENOX) injection  40 mg Subcutaneous Q24H   escitalopram  10 mg Oral Daily   finasteride  5 mg Oral Daily   levETIRAcetam  500 mg Oral BID   melatonin  10 mg Oral QHS   midodrine  10 mg Oral TID WC   pantoprazole  40 mg Oral Daily   QUEtiapine  25 mg Oral TID AC   QUEtiapine  50 mg Oral QHS   sodium chloride flush  3 mL Intravenous Q12H   tamsulosin  0.4 mg Oral QPC breakfast   Continuous Infusions: PRN Meds:.acetaminophen **OR** [DISCONTINUED] acetaminophen, metoCLOPramide (REGLAN) injection **AND** diphenhydrAMINE **AND** [EXPIRED] ketorolac, ondansetron **OR** ondansetron (ZOFRAN) IV, phenol, senna-docusate, traMADol    Treena Cosman M.D. [facility] 12/01/2021, 4:44 PM  Available via Epic secure chat 7am-7pm After 7 pm, please refer to night coverage provider listed on amion.

## 2021-12-01 NOTE — NC FL2 (Signed)
[place] LEVEL OF CARE SCREENING TOOL     IDENTIFICATION  Patient Name: John [name] Birthdate: 08/26/1935 Sex: male Admission Date (Current Location): 11/25/2021  [facility] [place] [place] and [place] Number:  Herbalist and Address:  [facility],  [place], [place]      Provider Number: [id number]  Attending Physician Name and Address:  Dwyane Dee, MD  Relative Name and Phone Number:  Raye Sorrow (Daughter)   [phone]    Current Level of Care: Hospital Recommended Level of Care: [place] Prior Approval Number:    Date Approved/Denied:   PASRR Number: [id number]  Discharge Plan: SNF    Current Diagnoses: Patient Active Problem List   Diagnosis Date Noted   COVID-19 virus infection 11/25/2021   History of intracranial hemorrhage 11/25/2021   Near syncope 11/25/2021   CAD (coronary artery disease)    Intracranial bleed ([place]) 01/15/2021   SAH (subarachnoid hemorrhage) ([place])    Non-traumatic rhabdomyolysis    Primary osteoarthritis of left knee 09/26/2019   S/P left knee arthroscopy 09/26/2019   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[place]) 09/25/2019   Adrenal insufficiency ([place]) 09/25/2019   Acute medial meniscus tear of left knee 08/09/2019   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 anxiety 02/19/2018   Diabetic peripheral neuropathy ([place]) 11/21/2017   BMI 21.0-21.9, adult 09/04/2015   Esophageal reflux 07/09/2015   Autonomic postural hypotension 02/06/2015   CKD stage G3a/A1, GFR 45-59 and albumin creatinine ratio <30 mg/g ([place]) 03/20/2014   Medication management 02/06/2014   Vitamin D deficiency 02/06/2014   Gastric AV malformation 10/30/2012   History of colonic polyps 09/14/2011   Mixed hyperlipidemia 03/12/2009   Essential hypertension 03/12/2009   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 ([place]) 03/12/2009   History of TIA (transient ischemic attack) 03/12/2009    Asthma 03/12/2009   COPD (chronic obstructive pulmonary disease) ([place]) 03/12/2009   GERD 03/12/2009    Orientation RESPIRATION BLADDER Height & Weight     Self, Situation, Place  Normal External catheter Weight: 82.2 kg Height:  (!) 9' 10.11" (300 cm)  BEHAVIORAL SYMPTOMS/MOOD NEUROLOGICAL BOWEL NUTRITION STATUS      Continent Diet (see d/c summary)  AMBULATORY STATUS COMMUNICATION OF NEEDS Skin   Extensive Assist Verbally Normal                       Personal Care Assistance Level of Assistance  Bathing, Feeding, Dressing Bathing Assistance: Maximum assistance Feeding assistance: Independent Dressing Assistance: Limited assistance     Functional Limitations Info  Sight, Hearing, Speech Sight Info: Adequate Hearing Info: Adequate Speech Info: Adequate    SPECIAL CARE FACTORS FREQUENCY  PT (By licensed PT), OT (By licensed OT)     PT Frequency: 5X/W OT Frequency: 5X/W            Contractures Contractures Info: Not present    Additional Factors Info  Code Status, Allergies Code Status Info: full Allergies Info: Gabapentin, Prozac, Soma, Beta Adrenergic Blockers           Current Medications (12/01/2021):  This is the current hospital active medication list Current Facility-Administered Medications  Medication Dose Route Frequency Provider Last Rate Last Admin   acetaminophen (TYLENOL) tablet 650 mg  650 mg Oral Q4H PRN Dwyane Dee, MD   650 mg at 11/30/21 2015   ALPRAZolam (XANAX XR) 24 hr tablet 0.5 mg  0.5 mg Oral Daily Lenore Cordia, MD   0.5  mg at 11/30/21 1131   atorvastatin (LIPITOR) tablet 40 mg  40 mg Oral QHS Zada Finders R, MD   40 mg at 11/30/21 2225   metoCLOPramide (REGLAN) injection 10 mg  10 mg Intravenous Q6H PRN Dwyane Dee, MD   10 mg at 11/29/21 2159   And   diphenhydrAMINE (BENADRYL) injection 12.5 mg  12.5 mg Intravenous Q6H PRN Dwyane Dee, MD   12.5 mg at 11/30/21 2015   And   ketorolac (TORADOL) 15 MG/ML injection 15 mg   15 mg Intravenous Q6H PRN Dwyane Dee, MD   15 mg at 11/30/21 2222   enoxaparin (LOVENOX) injection 40 mg  40 mg Subcutaneous Q24H Zada Finders R, MD   40 mg at 11/30/21 2222   escitalopram (LEXAPRO) tablet 10 mg  10 mg Oral Daily Zada Finders R, MD   10 mg at 11/30/21 0953   finasteride (PROSCAR) tablet 5 mg  5 mg Oral Daily Zada Finders R, MD   5 mg at 11/30/21 7622   levETIRAcetam (KEPPRA) tablet 500 mg  500 mg Oral BID Zada Finders R, MD   500 mg at 11/30/21 2225   melatonin tablet 10 mg  10 mg Oral QHS Zada Finders R, MD   10 mg at 11/30/21 2224   midodrine (PROAMATINE) tablet 10 mg  10 mg Oral BID WC Dwyane Dee, MD   10 mg at 11/30/21 1711   ondansetron (ZOFRAN) tablet 4 mg  4 mg Oral Q6H PRN Lenore Cordia, MD       Or   ondansetron (ZOFRAN) injection 4 mg  4 mg Intravenous Q6H PRN Lenore Cordia, MD       pantoprazole (PROTONIX) EC tablet 40 mg  40 mg Oral Daily Zada Finders R, MD   40 mg at 11/30/21 0952   phenol (CHLORASEPTIC) mouth spray 1 spray  1 spray Mouth/Throat PRN Dwyane Dee, MD   1 spray at 11/26/21 0803   QUEtiapine (SEROQUEL) tablet 25 mg  25 mg Oral TID AC Zada Finders R, MD   25 mg at 12/01/21 0835   QUEtiapine (SEROQUEL) tablet 50 mg  50 mg Oral QHS Zada Finders R, MD   50 mg at 11/30/21 2224   senna-docusate (Senokot-S) tablet 1 tablet  1 tablet Oral QHS PRN Zada Finders R, MD       sodium chloride flush (NS) 0.9 % injection 3 mL  3 mL Intravenous Q12H Zada Finders R, MD   3 mL at 11/30/21 2225   tamsulosin (FLOMAX) capsule 0.4 mg  0.4 mg Oral QPC breakfast Zada Finders R, MD   0.4 mg at 12/01/21 0836   traMADol (ULTRAM) tablet 100 mg  100 mg Oral Q8H PRN Dwyane Dee, MD   100 mg at 12/01/21 6333     Discharge Medications: Please see discharge summary for a list of discharge medications.  Relevant Imaging Results:  Relevant Lab Results:   Additional Information SS#: [place], [place]

## 2021-12-01 NOTE — Progress Notes (Signed)
Progress Note   Patient: John [name] NOM:[id number] DOB: Nov 20, 1934 DOA: 11/25/2021     5 DOS: the patient was seen and examined on 12/01/2021   Brief hospital course: John [name] is a 87 y.o. 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  Assessment and Plan: * COVID-19 virus infection- (present on admission) - CT value: 21.2 SARS-CoV-2 positive 07/01/2835 complicated by dehydration with hypotension and near syncope as well as generalized weakness.  Saturating well on room air without evidence of pneumonia/infiltrate on CXR. -Completed remdesivir course  Near syncope- (present on admission) Secondary to hypotension/dehydration due to poor oral intake in setting of COVID-19 viral infection. - s/p IV fluid hydration -PT following  Autonomic postural hypotension- (present on admission) - he has been on max dose florinef with still ongoing hypoTN, syncope - going to try transition to midodrine and adjust as needed - keep holding amlodipine and spironolactone - continue daily orthostatics (still orthostatic) - continue midodrine (only hold if SBP>160) -Midodrine being adjusted as needed  CAD (coronary artery disease)- (present on admission) Stable, denies any chest pain.  Continue statin.  History of intracranial hemorrhage Continue home Keppra for seizure prophylaxis. CT head without contrast on admission negative for acute intracranial abnormality.  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 anxiety- (present on admission) Continue home Lexapro, Seroquel, alprazolam.  CKD stage G3a/A1, GFR 45-59 and albumin creatinine ratio <30 mg/g ([employer])- (present on admission) Creatinine is slightly diminished compared to recent baseline.    - creat improved with IVF but developed some congestion on lung exam so fluids stopped  - encourage better oral intake       Subjective: No events overnight. Tired but seems okay this morning. No distress.   Physical Exam: Vitals:   12/01/21 0400 12/01/21 1234 12/01/21 1236 12/01/21 1239  BP:  (!) 157/68 (!) 159/75 96/66  Pulse: (!) 45 (!) 54 60 80  Resp:  18 20 20   Temp:  97.6 F (36.4 C)    TempSrc:  Oral    SpO2: 95% 95% 91% 97%  Weight:      Height:      Physical Exam Constitutional:      General: He is not in acute distress.    Appearance: Normal appearance.  HENT:     Head: Normocephalic and atraumatic.     Mouth/Throat:     Mouth: Mucous membranes are moist.  Eyes:     Extraocular Movements: Extraocular movements intact.  Cardiovascular:     Rate and Rhythm: Normal rate and regular rhythm.     Heart sounds: Normal heart sounds.  Pulmonary:     Effort: Pulmonary effort is normal. No respiratory distress.     Breath sounds: No wheezing.     Comments: Coarse breath sounds bilaterally  Abdominal:     General: Bowel sounds are normal. There is no distension.     Palpations: Abdomen is soft.     Tenderness: There is no abdominal tenderness.  Musculoskeletal:        General: Normal range of motion.     Cervical back: Normal range of motion and neck supple.  Skin:    General: Skin is warm and dry.  Neurological:     General: No focal deficit present.     Mental Status: He is alert.  Psychiatric:  Mood and Affect: Mood normal.        Behavior: Behavior normal.     Data Reviewed:  I have Reviewed nursing notes, Vitals, and Lab results since pt's last encounter. Pertinent lab results creat 1.26, Na 139 I have ordered test including BMP, CBC, Mg I have reviewed the last note from all staff over past 24 hours,  I have discussed pt's care plan and test results with nursing staff, CM.   Family Communication: son  Disposition: Status is:  Inpatient Remains inpatient appropriate because: ongoing covid treatment and treatment for orthostasis    Planned Discharge Destination:  [place] tentative for 2/8  DVT ppx: Lovenox     Author: Dwyane Dee, MD 12/01/2021 3:01 PM  For on call review [email or web address].

## 2021-12-01 NOTE — Plan of Care (Signed)
Pt placed on 2L O2 overnight due to sats of 89%. Pt continues to complain of headache. Problem: Clinical Measurements: Goal: Ability to maintain clinical measurements within normal limits will improve Outcome: Progressing Goal: Will remain free from infection Outcome: Progressing Goal: Diagnostic test results will improve Outcome: Progressing Goal: Respiratory complications will improve Outcome: Progressing Goal: Cardiovascular complication will be avoided Outcome: Progressing   Problem: Activity: Goal: Risk for activity intolerance will decrease Outcome: Progressing   Problem: Nutrition: Goal: Adequate nutrition will be maintained Outcome: Progressing   Problem: Elimination: Goal: Will not experience complications related to bowel motility Outcome: Progressing Goal: Will not experience complications related to urinary retention Outcome: Progressing   Problem: Safety: Goal: Ability to remain free from injury will improve Outcome: Progressing   Problem: Education: Goal: Knowledge of risk factors and measures for prevention of condition will improve Outcome: Progressing   Problem: Coping: Goal: Psychosocial and spiritual needs will be supported Outcome: Progressing   Problem: Respiratory: Goal: Will maintain a patent airway Outcome: Progressing Goal: Complications related to the disease process, condition or treatment will be avoided or minimized Outcome: Progressing

## 2021-12-01 NOTE — TOC Initial Note (Addendum)
Transition of Care [facility]) - Initial/Assessment Note    Patient Details  Name: John [name] MRN: [id number] Date of Birth: 06/20/1935  [facility]) CM/SW Contact:    Trish Mage, LCSW Phone Number: 12/01/2021, 9:57 AM  Clinical Narrative:   Porsche from [facility] reviewed the material, and they are recommending patient go to short term rehab prior to return to ALF.  He will be eligible on Saturday post 10 day isolation for COVID.  Bed search process and insurance authorization explained to son.  He asked that we call daughter going forward as she is [facility] POA.  Bed search initiated.  TOC will continue to follow during the course of hospitalization.  Addendum: Patient has COVID positive bed offer at [facility].  Daughter accepts bed offer.  Insurance auth initiated.                 Expected Discharge Plan: Skilled Nursing Facility Barriers to Discharge: SNF Pending bed offer   Patient Goals and CMS Choice     Choice offered to / list presented to : Adult Children, Patient  Expected Discharge Plan and Services Expected Discharge Plan: [place]   Discharge Planning Services: CM Consult Post Acute Care Choice: [place] Living arrangements for the past 2 months: [place]                                      Prior Living Arrangements/Services Living arrangements for the past 2 months: [place] Lives with:: Facility Resident Patient language and need for interpreter reviewed:: Yes        Need for Family Participation in Patient Care: Yes (Comment) Care giver support system in place?: Yes (comment)   Criminal Activity/Legal Involvement Pertinent to Current Situation/Hospitalization: No - Comment as needed  Activities of Daily Living Home Assistive Devices/Equipment: Grab bars in shower, Walker (specify type) ADL Screening (condition at time of admission) Patient's cognitive ability  adequate to safely complete daily activities?: Yes Is the patient deaf or have difficulty hearing?: No Does the patient have difficulty seeing, even when wearing glasses/contacts?: No Does the patient have difficulty concentrating, remembering, or making decisions?: Yes Patient able to express need for assistance with ADLs?: Yes Does the patient have difficulty dressing or bathing?: Yes Independently performs ADLs?: No Communication: Independent Dressing (OT): Dependent Is this a change from baseline?: Pre-admission baseline Grooming: Needs assistance Is this a change from baseline?: Pre-admission baseline Feeding: Independent Bathing: Needs assistance Is this a change from baseline?: Pre-admission baseline Toileting: Needs assistance Is this a change from baseline?: Pre-admission baseline In/Out Bed: Needs assistance Is this a change from baseline?: Pre-admission baseline Walks in Home: Needs assistance Is this a change from baseline?: Pre-admission baseline Does the patient have difficulty walking or climbing stairs?: Yes Weakness of Legs: None Weakness of Arms/Hands: None  Permission Sought/Granted Permission sought to share information with : Family Supports Permission granted to share information with : No  Share Information with NAME: Raye Sorrow (Daughter)   [phone]           Emotional Assessment       Orientation: : Oriented to Self, Oriented to Place, Oriented to  Time, Oriented to Situation Alcohol / Substance Use: Not Applicable Psych Involvement: No (comment)  Admission diagnosis:  SOB (shortness of breath) [R06.02] Near syncope [R55] COVID-19 virus infection [U07.1] COVID-19 [U07.1] Patient Active Problem  List   Diagnosis Date Noted   COVID-19 virus infection 11/25/2021   History of intracranial hemorrhage 11/25/2021   Near syncope 11/25/2021   CAD (coronary artery disease)    Intracranial bleed ([place]) 01/15/2021   SAH (subarachnoid hemorrhage)  (HCC)    Non-traumatic rhabdomyolysis    Primary osteoarthritis of left knee 09/26/2019   S/P left knee arthroscopy 09/26/2019   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[place]) 09/25/2019   Adrenal insufficiency ([place]) 09/25/2019   Acute medial meniscus tear of left knee 08/09/2019   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 anxiety 02/19/2018   Diabetic peripheral neuropathy ([place]) 11/21/2017   BMI 21.0-21.9, adult 09/04/2015   Esophageal reflux 07/09/2015   Autonomic postural hypotension 02/06/2015   CKD stage G3a/A1, GFR 45-59 and albumin creatinine ratio <30 mg/g (HCC) 03/20/2014   Medication management 02/06/2014   Vitamin D deficiency 02/06/2014   Gastric AV malformation 10/30/2012   History of colonic polyps 09/14/2011   Mixed hyperlipidemia 03/12/2009   Essential hypertension 03/12/2009   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

## 2021-12-02 DIAGNOSIS — N1831 Chronic kidney disease, stage 3a: Secondary | ICD-10-CM

## 2021-12-02 DIAGNOSIS — I2510 Atherosclerotic heart disease of native coronary artery without angina pectoris: Secondary | ICD-10-CM

## 2021-12-02 LAB — BASIC METABOLIC PANEL
Anion gap: 5 (ref 5–15)
BUN: 22 mg/dL (ref 8–23)
CO2: 25 mmol/L (ref 22–32)
Calcium: 8 mg/dL — ABNORMAL LOW (ref 8.9–10.3)
Chloride: 109 mmol/L (ref 98–111)
Creatinine, Ser: 1.11 mg/dL (ref 0.61–1.24)
GFR, Estimated: >60 mL/min (ref >60)
Glucose, Bld: 91 mg/dL (ref 70–99)
Potassium: 3.9 mmol/L (ref 3.5–5.1)
Sodium: 139 mmol/L (ref 135–145)

## 2021-12-02 LAB — MAGNESIUM: Magnesium: 2 mg/dL (ref 1.7–2.4)

## 2021-12-02 MED ORDER — SUMATRIPTAN SUCCINATE 50 MG PO TABS
50.0000 mg | ORAL_TABLET | ORAL | Status: DC | PRN
  Administered 2021-12-02 (×2): 50 mg via ORAL
  Filled 2021-12-02 (×3): qty 1

## 2021-12-02 NOTE — Plan of Care (Signed)
°  Problem: Clinical Measurements: Goal: Ability to maintain clinical measurements within normal limits will improve Outcome: Progressing Goal: Will remain free from infection Outcome: Progressing Goal: Diagnostic test results will improve Outcome: Progressing Goal: Respiratory complications will improve Outcome: Progressing Goal: Cardiovascular complication will be avoided Outcome: Progressing   Problem: Activity: Goal: Risk for activity intolerance will decrease Outcome: Progressing   Problem: Nutrition: Goal: Adequate nutrition will be maintained Outcome: Progressing   Problem: Elimination: Goal: Will not experience complications related to bowel motility Outcome: Progressing Goal: Will not experience complications related to urinary retention Outcome: Progressing   Problem: Safety: Goal: Ability to remain free from injury will improve Outcome: Progressing   Problem: Education: Goal: Knowledge of risk factors and measures for prevention of condition will improve Outcome: Progressing   Problem: Coping: Goal: Psychosocial and spiritual needs will be supported Outcome: Progressing   Problem: Respiratory: Goal: Will maintain a patent airway Outcome: Progressing Goal: Complications related to the disease process, condition or treatment will be avoided or minimized Outcome: Progressing

## 2021-12-02 NOTE — Progress Notes (Signed)
[facility]                                                                               John [name], is a 87 y.o. male, DOB - October 03, 1935, EXB:[id number] Admit date - 11/25/2021    Outpatient Primary MD for the patient is John Pinto, MD  LOS - 6  days    Brief summary   John [name] is a 87 y.o. 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   Assessment & Plan    Assessment and Plan: * COVID-19 virus infection- (present on admission) - CT value: 21.2 -SARS-CoV-2 positive 1/0/2725 complicated by dehydration with hypotension, near syncope, generalized weakness.   -Completed remdesivir course  CAD (coronary artery disease)- (present on admission) - Continue statin, stable.  No chest pain.  Near syncope- (present on admission) Secondary to hypotension/dehydration due to poor oral intake in setting of COVID-19 viral infection. - s/p IV fluid hydration -PT recommended SNF  History of intracranial hemorrhage -Continue home Keppra for seizure prophylaxis.  -CT head without contrast on admission negative for acute intracranial abnormality.  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 anxiety- (present on admission) -Continue home Lexapro, Seroquel, alprazolam.  Autonomic postural hypotension- (present on admission) - On max dose of Florinef with ongoing hypotension, patient transitioned to midodrine - hold amlodipine, spironolactone  - continue midodrine (only hold if SBP>160)   CKD stage G3a/A1, GFR 45-59 and albumin creatinine ratio <30 mg/g (HCC)- (present on admission) - Creatinine is slightly diminished compared to recent baseline.   -Encourage oral intake, creatinine improving, now at  baseline    Code Status: Full CODE STATUS DVT Prophylaxis:  enoxaparin (LOVENOX) injection 40 mg Start: 11/25/21 2300   Level of Care: Level of care: Telemetry Family Communication:  Disposition Plan:     Remains inpatient appropriate: Awaiting skilled nursing facility  Procedures:  None   Consultants:   None   Antimicrobials:  Completed remdesivir 11/29/2021  Medications  Scheduled Meds:  ALPRAZolam  0.5 mg Oral Daily   atorvastatin  40 mg Oral QHS   enoxaparin (LOVENOX) injection  40 mg Subcutaneous Q24H   escitalopram  10 mg Oral Daily   finasteride  5 mg Oral Daily   levETIRAcetam  500 mg Oral BID   melatonin  10 mg Oral QHS   midodrine  10 mg Oral TID WC   pantoprazole  40 mg Oral Daily   QUEtiapine  25 mg Oral TID AC   QUEtiapine  50 mg Oral QHS   sodium chloride flush  3 mL Intravenous Q12H   tamsulosin  0.4 mg Oral QPC breakfast   Continuous Infusions: PRN Meds:.acetaminophen **OR** [DISCONTINUED] acetaminophen, metoCLOPramide (REGLAN) injection **AND** diphenhydrAMINE **AND** [EXPIRED] ketorolac, ondansetron **OR** ondansetron (ZOFRAN) IV, phenol, senna-docusate, traMADol    Subjective:   John [name] was seen and examined today.  Sitting up, denies any specific complaints.  No acute dizziness, shortness of breath, abdominal pain.  No fevers   Objective:   Vitals:  12/01/21 1239 12/01/21 2037 12/02/21 0459 12/02/21 1341  BP: 96/66 (!) 159/63 (!) 169/68 (!) 167/83  Pulse: 80 (!) 55 (!) 47 (!) 57  Resp: 20 18 20 20   Temp:  97.9 F (36.6 C) (!) 97.4 F (36.3 C) 97.7 F (36.5 C)  TempSrc:  Oral Oral Oral  SpO2: 97% 91% 95% 93%  Weight:      Height:        Intake/Output Summary (Last 24 hours) at 12/02/2021 1441 Last data filed at 12/02/2021 1313 Gross per 24 hour  Intake 483 ml  Output 1300 ml  Net -817 ml   Filed Weights   11/29/21 2140 11/30/21 0500 12/01/21 0251  Weight: 90 kg 80 kg 82.2 kg     Exam General: Alert and oriented x 3,  NAD Cardiovascular: S1 S2 auscultated,  RRR Respiratory: Scattered rhonchi bilaterally Gastrointestinal: Soft, nontender, nondistended, + bowel sounds Ext: no pedal edema bilaterally Neuro: no neuro deficits Psych: normal affect   Data Reviewed:  I have personally reviewed following labs and imaging studies   CBC Lab Results  Component Value Date   WBC 4.4 12/01/2021   RBC 3.42 (L) 12/01/2021   HGB 11.4 (L) 12/01/2021   HCT 33.5 (L) 12/01/2021   MCV 98.0 12/01/2021   MCH 33.3 12/01/2021   PLT 182 12/01/2021   MCHC 34.0 12/01/2021   RDW 12.0 12/01/2021   LYMPHSABS 1.6 12/01/2021   MONOABS 0.5 12/01/2021   EOSABS 0.3 12/01/2021   BASOSABS 0.0 09/31/1216     Last metabolic panel Lab Results  Component Value Date   NA 139 12/02/2021   K 3.9 12/02/2021   CL 109 12/02/2021   CO2 25 12/02/2021   BUN 22 12/02/2021   CREATININE 1.11 12/02/2021   GLUCOSE 91 12/02/2021   GFRNONAA >60 12/02/2021   GFRAA 61 07/24/2020   CALCIUM 8.0 (L) 12/02/2021   PHOS 3.1 01/19/2021   PROT 5.1 (L) 12/01/2021   ALBUMIN 2.7 (L) 12/01/2021   BILITOT 0.5 12/01/2021   ALKPHOS 47 12/01/2021   AST 9 (L) 12/01/2021   ALT 8 12/01/2021   ANIONGAP 5 12/02/2021    CBG (last 3)  No results for input(s): GLUCAP in the last 72 hours.    Coagulation Profile: No results for input(s): INR, PROTIME in the last 168 hours.   Radiology Studies: No results found.     Estill Cotta M.D. [facility] 12/02/2021, 2:41 PM  Available via Epic secure chat 7am-7pm After 7 pm, please refer to night coverage provider listed on amion.

## 2021-12-02 NOTE — Progress Notes (Signed)
Occupational Therapy Treatment Patient Details Name: John [name] MRN: [id number] DOB: 25-Aug-1935 Today's Date: 12/02/2021   History of present illness Patient is an 87 y.o. male who presented to the ED from his PCP office for evaluation of near syncope with hypotension, found to be Covid-19 positive. 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   OT comments  Patient was noted to have improved sitting balance on edge of bed on this date with no leaning noted. Patient was able to engage in bathing tasks sitting EOB with min guard. Patient would continue to benefit from skilled OT services at this time while admitted and after d/c to address noted deficits in order to improve overall safety and independence in ADLs.     Recommendations for follow up therapy are one component of a multi-disciplinary discharge planning process, led by the attending physician.  Recommendations may be updated based on patient status, additional functional criteria and insurance authorization.    Follow Up Recommendations  Home health OT    Assistance Recommended at Discharge Frequent or constant Supervision/Assistance  Patient can return home with the following  A little help with walking and/or transfers;A little help with bathing/dressing/bathroom   Equipment Recommendations  None recommended by OT    Recommendations for Other Services      Precautions / Restrictions Precautions Precautions: Fall Restrictions Weight Bearing Restrictions: No       Mobility Bed Mobility                    Transfers                         Balance                                           ADL either performed or assessed with clinical judgement   ADL Overall ADL's : Needs assistance/impaired     Grooming: Minimal assistance;Set  up;Sitting Grooming Details (indicate cue type and reason): EOB with increased time and assist to seqeunce task. Upper Body Bathing: Set up;Sitting Upper Body Bathing Details (indicate cue type and reason): EOB with improved sitting balance since last session Lower Body Bathing: Sit to/from stand;Sitting/lateral leans;Moderate assistance   Upper Body Dressing : Set up;Sitting Upper Body Dressing Details (indicate cue type and reason): EOB with increased time       Toilet Transfer Details (indicate cue type and reason): patient was able to transfer from edge of bed to recliner in room with mod A with increased cues for proper sequencing of task.                Extremity/Trunk Assessment              Vision       Perception     Praxis      Cognition Arousal/Alertness: Awake/alert Behavior During Therapy: WFL for tasks assessed/performed Overall Cognitive Status: Difficult to assess                                          Exercises Other Exercises Other Exercises: patient completed 5 chair push ups x2 with noted fatigue with task.  Shoulder Instructions       General Comments      Pertinent Vitals/ Pain       Pain Assessment Pain Assessment: No/denies pain  Home Living                                          Prior Functioning/Environment              Frequency  Min 2X/week        Progress Toward Goals  OT Goals(current goals can now be found in the care plan section)  Progress towards OT goals: Progressing toward goals     Plan Discharge plan remains appropriate    Co-evaluation                 AM-PAC OT "6 Clicks" Daily Activity     Outcome Measure   Help from another person eating meals?: None Help from another person taking care of personal grooming?: A Little Help from another person toileting, which includes using toliet, bedpan, or urinal?: A Little Help from another person bathing  (including washing, rinsing, drying)?: A Little Help from another person to put on and taking off regular upper body clothing?: A Little Help from another person to put on and taking off regular lower body clothing?: A Little 6 Click Score: 19    End of Session Equipment Utilized During Treatment: Rolling walker (2 wheels);Gait belt  OT Visit Diagnosis: Unsteadiness on feet (R26.81);Muscle weakness (generalized) (M62.81);Pain   Activity Tolerance Patient tolerated treatment well   Patient Left in chair;with call bell/phone within reach;with chair alarm set   Nurse Communication Mobility status        Time: 7048-8891 OT Time Calculation (min): 22 min  Charges: OT General Charges $OT Visit: 1 Visit OT Treatments $Self Care/Home Management : 8-22 mins  Jackelyn Poling OTR/L, MS Acute Rehabilitation Department Office# [phone] Pager# [phone]   Marcellina Millin 12/02/2021, 12:11 PM

## 2021-12-02 NOTE — TOC Progression Note (Signed)
Transition of Care [facility]) - Progression Note    Patient Details  Name: John [name] MRN: [id number] Date of Birth: 09/06/1935  Transition of Care [facility]) CM/SW [place], [place] Phone Number: 12/02/2021, 3:40 PM  Clinical Narrative:   Insurance authorization received.  Patient will transfer to [address] in AM. TOC will continue to follow during the course of hospitalization.     Expected Discharge Plan: [place] Barriers to Discharge: Barriers Resolved  Expected Discharge Plan and Services Expected Discharge Plan: [place]   Discharge Planning Services: CM Consult Post Acute Care Choice: [place] Living arrangements for the past 2 months: [place]                                       Social Determinants of Health (SDOH) Interventions    Readmission Risk Interventions No flowsheet data found.

## 2021-12-03 LAB — BASIC METABOLIC PANEL
Anion gap: 6 (ref 5–15)
BUN: 16 mg/dL (ref 8–23)
CO2: 25 mmol/L (ref 22–32)
Calcium: 7.8 mg/dL — ABNORMAL LOW (ref 8.9–10.3)
Chloride: 108 mmol/L (ref 98–111)
Creatinine, Ser: 1.03 mg/dL (ref 0.61–1.24)
GFR, Estimated: >60 mL/min (ref >60)
Glucose, Bld: 90 mg/dL (ref 70–99)
Potassium: 3.9 mmol/L (ref 3.5–5.1)
Sodium: 139 mmol/L (ref 135–145)

## 2021-12-03 LAB — MAGNESIUM: Magnesium: 1.8 mg/dL (ref 1.7–2.4)

## 2021-12-03 MED ORDER — AMLODIPINE BESYLATE 5 MG PO TABS
5.0000 mg | ORAL_TABLET | Freq: Every day | ORAL | Status: DC
  Administered 2021-12-03: 5 mg via ORAL
  Filled 2021-12-03: qty 1

## 2021-12-03 MED ORDER — ATORVASTATIN CALCIUM 40 MG PO TABS: 40.0000 mg | ORAL_TABLET | Freq: Every day | ORAL | Status: DC

## 2021-12-03 MED ORDER — AMLODIPINE BESYLATE 5 MG PO TABS: 5.0000 mg | ORAL_TABLET | Freq: Every day | ORAL | Status: DC

## 2021-12-03 MED ORDER — MIDODRINE HCL 10 MG PO TABS: 10.0000 mg | ORAL_TABLET | Freq: Three times a day (TID) | ORAL | Status: DC

## 2021-12-03 MED ORDER — ALPRAZOLAM ER 0.5 MG PO TB24: 0.5000 mg | ORAL_TABLET | Freq: Every day | ORAL | 0 refills | Status: DC

## 2021-12-03 MED ORDER — MIDODRINE HCL 5 MG PO TABS
10.0000 mg | ORAL_TABLET | Freq: Two times a day (BID) | ORAL | Status: DC
  Filled 2021-12-03: qty 2

## 2021-12-03 MED ORDER — LOPERAMIDE HCL 2 MG PO CAPS
2.0000 mg | ORAL_CAPSULE | Freq: Once | ORAL | Status: AC
  Administered 2021-12-03: 2 mg via ORAL
  Filled 2021-12-03: qty 1

## 2021-12-03 MED ORDER — MIDODRINE HCL 10 MG PO TABS: 10.0000 mg | ORAL_TABLET | Freq: Two times a day (BID) | ORAL | Status: DC

## 2021-12-03 MED ORDER — TRAMADOL HCL 50 MG PO TABS
100.0000 mg | ORAL_TABLET | Freq: Three times a day (TID) | ORAL | 0 refills | Status: DC | PRN
Start: 2021-12-03 — End: 2022-12-09

## 2021-12-03 MED ORDER — ACETAMINOPHEN 500 MG PO TABS: 1000.0000 mg | ORAL_TABLET | Freq: Four times a day (QID) | ORAL | Status: DC | PRN

## 2021-12-03 NOTE — TOC Transition Note (Signed)
Transition of Care [facility]) - CM/SW Discharge Note   Patient Details  Name: John [name] MRN: [id number] Date of Birth: June 18, 1935  [facility] of Care [facility]) CM/SW Contact:  Trish Mage, LCSW Phone Number: 12/03/2021, 11:40 AM   Clinical Narrative:   Spoke with daughter this AM who shared with me that director at [facility] says that her father can return there from hospital.  I confirmed this myself with Carmelia Roller, Development worker, international aid at [employer].  Updated FL2, d/c suymmary and HH PT orders Faxed to [phone].  Will call son for transportation.  Nursing, please call report to [phone], ask for med tech. No further needs identified.  TOC sign off.    Final next level of care: Assisted Living Barriers to Discharge: Barriers Resolved   Patient Goals and CMS Choice     Choice offered to / list presented to : Adult Children, Patient  Discharge Placement                       Discharge Plan and Services   Discharge Planning Services: CM Consult Post Acute Care Choice: Skilled Nursing Facility                               Social Determinants of Health (SDOH) Interventions     Readmission Risk Interventions No flowsheet data found.

## 2021-12-03 NOTE — Care Management Important Message (Signed)
Important Message  Patient Details IM Letter given to the Patient. Name: John [name] MRN: [id number] Date of Birth: [birth year]   Medicare Important Message Given:  Yes     Kerin Salen 12/03/2021, 10:57 AM

## 2021-12-03 NOTE — Discharge Summary (Signed)
Physician Discharge Summary   Patient: John [name] MRN: [id number] DOB: Jul 15, 1935  Admit date:     11/25/2021  Discharge date: 12/03/21  Discharge Physician: Estill Cotta   PCP: Unk Pinto, MD   Recommendations at discharge:   Florinef discontinued, continue midodrine  Discharge Diagnoses:    COVID-19 virus infection   CKD stage G3a/A1, GFR 45-59 and albumin creatinine ratio <30 mg/g (HCC)   Autonomic postural hypotension   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 anxiety   History of intracranial hemorrhage   Near syncope   CAD (coronary artery disease)    Hospital Course: John [name] is a 87 y.o. 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  Assessment and Plan: * COVID-19 virus infection- (present on admission) - CT value: 21.2 -SARS-CoV-2 positive 0/11/5850 complicated by dehydration with hypotension, near syncope, generalized weakness.   -Completed remdesivir course -PT recommended SNF  CAD (coronary artery disease)- (present on admission) - Continue statin, stable.  No chest pain.  Near syncope- (present on admission) Secondary to hypotension/dehydration due to poor oral intake in setting of COVID-19 viral infection. -Received IV fluid hydration -PT recommended SNF  History of intracranial hemorrhage -Continue home Keppra for seizure prophylaxis.  -CT head without contrast on admission negative for acute intracranial abnormality. -Stable, no acute issues  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 anxiety- (present on admission) -Continue home Lexapro, Seroquel, alprazolam.  Autonomic postural hypotension- (present on admission) - Patient was on max dose of Florinef with ongoing hypotension,  patient then transitioned to midodrine and Florinef discontinued. - hold spironolactone, continue amlodipine 5 mg daily if standing SBP above >160 - continue midodrine (only hold if SBP>160), may titrate down to 5 mg if SBP has been running high consistently.   CKD stage G3a/A1, GFR 45-59 and albumin creatinine ratio <30 mg/g (HCC)- (present on admission) - Creatinine is slightly diminished compared to recent baseline.   -Continue to encourage oral intake.   -Creatinine improving and now at baseline   Pain control - [name] was reviewed. and patient was instructed, not to drive, operate heavy machinery, perform activities at heights, swimming or participation in water activities or provide baby-sitting services while on Pain, Sleep and Anxiety Medications; until their outpatient Physician has advised to do so again. Also recommended to not to take more than prescribed Pain, Sleep and Anxiety Medications.   Consultants: None Procedures performed: None Disposition: Skilled nursing facility Diet recommendation:  Discharge Diet Orders (From admission, onward)     Start     Ordered   12/03/21 0000  Diet - low sodium heart healthy        12/03/21 1014           Regular diet  DISCHARGE MEDICATION: Allergies as of 12/03/2021       Reactions   Gabapentin Other (See Comments)   Severe confusion   Prozac [fluoxetine Hcl] Other (See Comments)   Patient state that it does not agree with him   Soma [carisoprodol] Other (See Comments)   Patient stated that it does not agree with him   Beta Adrenergic Blockers Other (See Comments)   REACTION: Bradycardia        Medication List     STOP taking these medications    fludrocortisone 0.1 MG tablet  Commonly known as: FLORINEF   potassium citrate 10 MEQ (1080 MG) SR tablet Commonly known as: UROCIT-K   simvastatin 80 MG tablet Commonly known as: ZOCOR Replaced by: atorvastatin 40  MG tablet   spironolactone 25 MG tablet Commonly known as: ALDACTONE       TAKE these medications    acetaminophen 500 MG tablet Commonly known as: Pain Relief Extra Strength Take 2 tablets (1,000 mg total) by mouth every 6 (six) hours as needed (for pain).   ALPRAZolam 0.5 MG 24 hr tablet Commonly known as: ALPRAZolam XR Take 1 tablet (0.5 mg total) by mouth daily at 12 noon.   amLODipine 5 MG tablet Commonly known as: NORVASC Take 1 tablet (5 mg total) by mouth daily. What changed:  medication strength how much to take how to take this when to take this additional instructions   atorvastatin 40 MG tablet Commonly known as: LIPITOR Take 1 tablet (40 mg total) by mouth at bedtime. Replaces: simvastatin 80 MG tablet   Cholecalciferol 50 MCG (2000 UT) Tabs Take 1  capsule  Daily What changed:  how much to take how to take this when to take this additional instructions   escitalopram 10 MG tablet Commonly known as: LEXAPRO TAKE ONE TABLET DAILY FOR MOOD AND ANXIETY What changed:  how much to take how to take this when to take this additional instructions   finasteride 5 MG tablet Commonly known as: PROSCAR Take  1 tablet  Daily  for Prostate What changed:  how much to take how to take this when to take this additional instructions   freestyle lancets 1 each by Other route as needed for other. Use as instructed   FREESTYLE LITE test strip Generic drug: glucose blood TEST BLOOD SUGAR ONCE DAILY   levETIRAcetam 500 MG tablet Commonly known as: KEPPRA Take 1 tablet (500 mg total) by mouth 2 (two) times daily. Take  1 tablet  2 x /day  with Breakfast & Supper to Prevent Seizures   loperamide 2 MG capsule Commonly known as: IMODIUM Take 1 tablet qod - hs  - even days What changed:  how much to take how to take this when to take this additional instructions   Melatonin 5 MG Caps Take 10 mg by mouth at bedtime.   midodrine 10 MG tablet Commonly  known as: PROAMATINE Take 1 tablet (10 mg total) by mouth 2 (two) times daily with a meal.   nitroGLYCERIN 0.4 MG SL tablet Commonly known as: NITROSTAT Sig: 1 tablet under tongue every 3 to 5 minutes as needed for Angina - Please Dispense # 2 bottles of # 25 tabs   pantoprazole 40 MG tablet Commonly known as: PROTONIX Take  1 tablet  Daily  to Prevent Heartburn & Indigestion What changed:  how much to take how to take this when to take this additional instructions   polyethylene glycol 17 g packet Commonly known as: MIRALAX / GLYCOLAX Take 17 g by mouth daily as needed for moderate constipation.   QUEtiapine 25 MG tablet Commonly known as: SEROQUEL TAKE ONE TABLET BY MOUTH 3 TIMES A DAY BEFORE MEALS TAKE 2 TABLETS (50MG ) BY MOUTH AT BEDTIME. What changed: See the new instructions.   tamsulosin 0.4 MG Caps capsule Commonly known as: FLOMAX Take 1 capsule Daily for Prosate What changed:  how much to take how to take this when to take this additional instructions   traMADol 50 MG tablet Commonly known as: ULTRAM Take 2 tablets (100  mg total) by mouth every 8 (eight) hours as needed for severe pain or moderate pain (headache/migraine).   vitamin C 500 MG tablet Commonly known as: ASCORBIC ACID Take 1 tablet Daily        Contact information for follow-up providers     Unk Pinto, MD. Schedule an appointment as soon as possible for a visit in 2 week(s).   Specialty: Internal Medicine Why: for hospital follow-up Contact information: [address] [place] [address] [phone]         Burnell Blanks, MD .   Specialty: Cardiology Contact information: [place] [place] [address] [phone]              Contact information for after-discharge care     [facility] .   Service: Skilled Nursing Contact information: [place] [phone]                     Discharge Exam: S: Denies any complaints, not on any O2.  Denies any headache, nausea or vomiting.  Filed Weights   11/29/21 2140 11/30/21 0500 12/01/21 0251  Weight: 90 kg 80 kg 82.2 kg   Vitals:   12/02/21 0459 12/02/21 1341 12/02/21 2009 12/03/21 0503  BP: (!) 169/68 (!) 167/83 (!) 170/83 (!) 162/76  Pulse: (!) 47 (!) 57 65 64  Resp: 20 20 16 16   Temp: (!) 97.4 F (36.3 C) 97.7 F (36.5 C) 98.3 F (36.8 C) 97.8 F (36.6 C)  TempSrc: Oral Oral Oral Oral  SpO2: 95% 93% 96% 92%  Weight:      Height:        Physical Exam General: Alert and oriented x 3, NAD Cardiovascular: S1 S2 clear, RRR. No pedal edema b/l Respiratory: CTAB, no wheezing, rales or rhonchi Gastrointestinal: Soft, nontender, nondistended, NBS Ext: no pedal edema bilaterally Neuro: no new deficits Psych: Normal affect and demeanor, alert and oriented x3   Condition at discharge: good  The results of significant diagnostics from this hospitalization (including imaging, microbiology, ancillary and laboratory) are listed below for reference.   Imaging Studies: DG Chest 1 View  Result Date: 11/25/2021 CLINICAL DATA:  Near syncope, COVID positive, hypotension EXAM: CHEST  1 VIEW COMPARISON:  01/15/2021 FINDINGS: Single frontal view of the chest demonstrates a stable cardiac silhouette. Postsurgical changes from median sternotomy. Stable hiatal hernia. No airspace disease, effusion, or pneumothorax. No acute bony abnormalities. IMPRESSION: 1. No acute intrathoracic process. 2. Stable hiatal hernia. Electronically Signed   By: Randa Ngo M.D.   On: 11/25/2021 17:11   CT HEAD WO CONTRAST (5MM)  Result Date: 11/25/2021 CLINICAL DATA:  Mental status change, unknown cause EXAM: CT HEAD WITHOUT CONTRAST TECHNIQUE: Contiguous axial images were obtained from the base of the skull through the vertex without intravenous contrast.  RADIATION DOSE REDUCTION: This exam was performed according to the departmental dose-optimization program which includes automated exposure control, adjustment of the mA and/or kV according to patient size and/or use of iterative reconstruction technique. COMPARISON:  01/16/2021 FINDINGS: Brain: There is no acute intracranial hemorrhage, mass effect, or edema. No acute appearing loss of gray-white differentiation. Right greater than left encephalomalacia/gliosis at the site of prior frontal hemorrhages. There is no extra-axial fluid collection. Prominence of the ventricles and sulci reflects similar parenchymal volume loss. Confluent areas of low-density in the supratentorial white matter are nonspecific but probably reflect  similar moderate to marked chronic microvascular ischemic changes. Vascular: There is atherosclerotic calcification at the skull base. Skull: Calvarium is unremarkable. Sinuses/Orbits: Minor mucosal thickening. Bilateral lens replacements. Other: None. IMPRESSION: No acute intracranial abnormality. Chronic/nonemergent findings detailed above. Electronically Signed   By: Macy Mis M.D.   On: 11/25/2021 17:06    Microbiology: Results for orders placed or performed during the hospital encounter of 11/25/21  Blood Culture (routine x 2)     Status: None   Collection Time: 11/25/21  4:42 PM   Specimen: BLOOD  Result Value Ref Range Status   Specimen Description   Final    BLOOD RIGHT ANTECUBITAL Performed at [facility] [address], [place], [place] [address]    Special Requests   Final    BOTTLES DRAWN AEROBIC AND ANAEROBIC Blood Culture adequate volume Performed at [facility] [address] [place], [place] [address]    Culture   Final    NO GROWTH 5 DAYS Performed at [facility], [place] [address] [place], [place] [address]    Report Status 11/30/2021 FINAL  Final  Blood Culture (routine x 2)     Status: None    Collection Time: 11/25/21  4:48 PM   Specimen: BLOOD  Result Value Ref Range Status   Specimen Description   Final    BLOOD LEFT ANTECUBITAL Performed at [facility] [address], [place], [place] [address]    Special Requests   Final    BOTTLES DRAWN AEROBIC AND ANAEROBIC Blood Culture adequate volume Performed at [facility] [address], [place], [place] [address]    Culture   Final    NO GROWTH 5 DAYS Performed at [facility], [place] [address] [place], [place] [address]    Report Status 11/30/2021 FINAL  Final  Resp Panel by RT-PCR (Flu A&B, Covid) Nasopharyngeal Swab     Status: Abnormal   Collection Time: 11/25/21  5:10 PM   Specimen: Nasopharyngeal Swab; Nasopharyngeal(NP) swabs in vial transport medium  Result Value Ref Range Status   SARS Coronavirus 2 by RT PCR POSITIVE (A) NEGATIVE Final    Comment: (NOTE) SARS-CoV-2 target nucleic acids are DETECTED.  The SARS-CoV-2 RNA is generally detectable in upper respiratory specimens during the acute phase of infection. Positive results are indicative of the presence of the identified virus, but do not rule out bacterial infection or co-infection with other pathogens not detected by the test. Clinical correlation with patient history and other diagnostic information is necessary to determine patient infection status. The expected result is Negative.  Fact Sheet for Patients: EntrepreneurPulse.com.au  Fact Sheet for Healthcare Providers: IncredibleEmployment.be  This test is not yet approved or cleared by the [place] FDA and  has been authorized for detection and/or diagnosis of SARS-CoV-2 by FDA under an Emergency Use Authorization (EUA).  This EUA will remain in effect (meaning this test can be used) for the duration of  the COVID-19 declaration under Section 564(b)(1) of the A ct, 21 U.S.C. section 360bbb-3(b)(1), unless the  authorization is terminated or revoked sooner.     Influenza A by PCR NEGATIVE NEGATIVE Final   Influenza B by PCR NEGATIVE NEGATIVE Final    Comment: (NOTE) The Xpert Xpress SARS-CoV-2/FLU/RSV plus assay is intended as an aid in the diagnosis of influenza from Nasopharyngeal swab specimens and should not be used as a sole basis for treatment. Nasal washings and aspirates are unacceptable for  Xpert Xpress SARS-CoV-2/FLU/RSV testing.  Fact Sheet for Patients: EntrepreneurPulse.com.au  Fact Sheet for Healthcare Providers: IncredibleEmployment.be  This test is not yet approved or cleared by the [place] FDA and has been authorized for detection and/or diagnosis of SARS-CoV-2 by FDA under an Emergency Use Authorization (EUA). This EUA will remain in effect (meaning this test can be used) for the duration of the COVID-19 declaration under Section 564(b)(1) of the Act, 21 U.S.C. section 360bbb-3(b)(1), unless the authorization is terminated or revoked.  Performed at [facility], [address] [place], [place] [address]     Labs: CBC: Recent Labs  Lab 11/27/21 [phone] 11/28/21 0646 11/29/21 0600 11/30/21 0537 12/01/21 0517  WBC 6.1 7.1 6.4 5.5 4.4  NEUTROABS 4.3 4.8 3.7 3.1 2.0  HGB 12.3* 11.6* 11.4* 11.3* 11.4*  HCT 36.0* 35.5* 34.2* 33.2* 33.5*  MCV 100.3* 100.9* 101.2* 99.1 98.0  PLT 144* 133* 146* 148* 715   Basic Metabolic Panel: Recent Labs  Lab 11/29/21 0600 11/30/21 0537 12/01/21 0517 12/02/21 0505 12/03/21 0446  NA 136 137 139 139 139  K 3.8 4.0 3.8 3.9 3.9  CL 102 108 109 109 108  CO2 27 24 25 25 25   GLUCOSE 89 91 90 91 90  BUN 25* 25* 24* 22 16  CREATININE 1.40* 1.19 1.26* 1.11 1.03  CALCIUM 7.8* 7.6* 8.0* 8.0* 7.8*  MG 2.1 1.9 1.9 2.0 1.8   Liver Function Tests: Recent Labs  Lab 11/27/21 0504 11/28/21 0646 11/29/21 0600 11/30/21 0537 12/01/21 0517  AST 17 13* 11* 13* 9*  ALT 12  11 10 10 8   ALKPHOS 52 45 47 44 47  BILITOT 0.5 0.5 0.5 0.6 0.5  PROT 5.7* 5.2* 5.2* 5.0* 5.1*  ALBUMIN 3.1* 2.6* 2.6* 2.5* 2.7*   CBG: No results for input(s): GLUCAP in the last 168 hours.  Discharge time spent: greater than 30 minutes.  Signed: Estill Cotta, MD [facility] 12/03/2021

## 2021-12-03 NOTE — NC FL2 (Signed)
[place] LEVEL OF CARE SCREENING TOOL     IDENTIFICATION  Patient Name: John [name] Birthdate: May 06, 1935 Sex: male Admission Date (Current Location): 11/25/2021  [facility] [place] Number:  Herbalist and Address:  [facility],  [address] [place], [place]      Provider Number: [id number]  Attending Physician Name and Address:  Mendel Corning, MD  Relative Name and Phone Number:  Raye Sorrow (Daughter)   [phone]    Current Level of Care: Hospital Recommended Level of Care: [place] Prior Approval Number:    Date Approved/Denied:   PASRR Number: [id number]  Discharge Plan: Domiciliary (Rest home) ([facility])    Current Diagnoses: Patient Active Problem List   Diagnosis Date Noted   COVID-19 virus infection 11/25/2021   History of intracranial hemorrhage 11/25/2021   Near syncope 11/25/2021   CAD (coronary artery disease)    Intracranial bleed ([place]) 01/15/2021   SAH (subarachnoid hemorrhage) ([place])    Non-traumatic rhabdomyolysis    Primary osteoarthritis of left knee 09/26/2019   S/P left knee arthroscopy 09/26/2019   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[place]) 09/25/2019   Adrenal insufficiency ([place]) 09/25/2019   Acute medial meniscus tear of left knee 08/09/2019   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 anxiety 02/19/2018   Diabetic peripheral neuropathy ([place]) 11/21/2017   BMI 21.0-21.9, adult 09/04/2015   Esophageal reflux 07/09/2015   Autonomic postural hypotension 02/06/2015   CKD stage G3a/A1, GFR 45-59 and albumin creatinine ratio <30 mg/g ([place]) 03/20/2014   Medication management 02/06/2014   Vitamin D deficiency 02/06/2014   Gastric AV malformation 10/30/2012   History of colonic polyps 09/14/2011   Mixed hyperlipidemia 03/12/2009   Essential hypertension 03/12/2009   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 ([place]) 03/12/2009   History of TIA  (transient ischemic attack) 03/12/2009   Asthma 03/12/2009   COPD (chronic obstructive pulmonary disease) ([place]) 03/12/2009   GERD 03/12/2009    Orientation RESPIRATION BLADDER Height & Weight     Self, Situation, Place  Normal Continent Weight: 82.2 kg Height:  (!) 9' 10.11" (300 cm)  BEHAVIORAL SYMPTOMS/MOOD NEUROLOGICAL BOWEL NUTRITION STATUS      Continent Diet (Heart Healthy)  AMBULATORY STATUS COMMUNICATION OF NEEDS Skin   Limited Assist Verbally Normal                       Personal Care Assistance Level of Assistance  Bathing, Feeding, Dressing Bathing Assistance: Limited assistance Feeding assistance: Independent Dressing Assistance: Limited assistance     Functional Limitations Info  Sight, Hearing, Speech Sight Info: Adequate Hearing Info: Adequate Speech Info: Adequate    SPECIAL CARE FACTORS FREQUENCY  PT (By licensed PT)     PT Frequency: 2-3X/W OT Frequency: 5X/W            Contractures Contractures Info: Not present    Additional Factors Info  Code Status, Allergies Code Status Info: full Allergies Info: Gabapentin, Prozac, Soma, Beta Adrenergic Blockers           Discharge Medications: acetaminophen 500 MG tablet Commonly known as: Pain Relief Extra Strength Take 2 tablets (1,000 mg total) by mouth every 6 (six) hours as needed (for pain).           ALPRAZolam 0.5 MG 24 hr tablet Commonly known as: ALPRAZolam XR Take 1 tablet (0.5 mg total) by mouth daily at 12 noon.  Icon medications to change how you take   amLODipine 5 MG tablet Commonly known as: NORVASC Take 1 tablet (5 mg total) by mouth daily. What changed:  medication strength how much to take how to take this when to take this additional instructions         Icon medications to start taking   atorvastatin 40 MG tablet Commonly known as: LIPITOR Take 1 tablet (40 mg total) by mouth at bedtime. Replaces: simvastatin 80 MG tablet          Cholecalciferol 50 MCG  (2000 UT) Tabs Take 1 capsule Daily          escitalopram 10 MG tablet Commonly known as: LEXAPRO TAKE ONE TABLET DAILY FOR MOOD AND ANXIETY          finasteride 5 MG tablet Commonly known as: PROSCAR Take 1 tablet Daily for Prostate          freestyle lancets 1 each by Other route as needed for other. Use as instructed          FREESTYLE LITE test strip TEST BLOOD SUGAR ONCE DAILY Generic drug: glucose blood          levETIRAcetam 500 MG tablet Commonly known as: KEPPRA Take 1 tablet (500 mg total) by mouth 2 (two) times daily. Take 1 tablet 2 x /day with Breakfast & Supper to Prevent Seizures          loperamide 2 MG capsule Commonly known as: IMODIUM Take 1 tablet qod - hs - even days          Melatonin 5 MG Caps Take 10 mg by mouth at bedtime.         Icon medications to start taking   midodrine 10 MG tablet Commonly known as: PROAMATINE Take 1 tablet (10 mg total) by mouth 2 (two) times daily with a meal.          nitroGLYCERIN 0.4 MG SL tablet Commonly known as: NITROSTAT Sig: 1 tablet under tongue every 3 to 5 minutes as needed for Angina - Please Dispense # 2 bottles of # 25 tabs For: Acute Angina Pectoris          pantoprazole 40 MG tablet Commonly known as: PROTONIX Take 1 tablet Daily to Prevent Heartburn & Indigestion          polyethylene glycol 17 g packet Commonly known as: MIRALAX / GLYCOLAX Take 17 g by mouth daily as needed for moderate constipation.          QUEtiapine 25 MG tablet Commonly known as: SEROQUEL TAKE ONE TABLET BY MOUTH 3 TIMES A DAY BEFORE MEALS TAKE 2 TABLETS (50MG ) BY MOUTH AT BEDTIME.          tamsulosin 0.4 MG Caps capsule Commonly known as: FLOMAX Take 1 capsule Daily for Prosate         Icon medications to start taking   traMADol 50 MG tablet Commonly known as: ULTRAM Take 2 tablets (100 mg total) by mouth every 8 (eight) hours as needed for severe pain or moderate pain (headache/migraine).          vitamin C 500 MG tablet Commonly  known as: ASCORBIC ACID Take 1 tablet Daily           Relevant Imaging Results:  Relevant Lab Results:   Additional Information SS#: [place], [place]

## 2021-12-07 ENCOUNTER — Other Ambulatory Visit: Payer: Self-pay | Admitting: Internal Medicine

## 2021-12-17 ENCOUNTER — Other Ambulatory Visit: Payer: Self-pay | Admitting: Internal Medicine

## 2022-01-26 ENCOUNTER — Encounter: Payer: PPO | Admitting: Internal Medicine

## 2022-05-20 ENCOUNTER — Other Ambulatory Visit: Payer: Self-pay | Admitting: Internal Medicine

## 2022-05-20 DIAGNOSIS — F419 Anxiety disorder, unspecified: Secondary | ICD-10-CM

## 2022-06-15 IMAGING — CT CT HEAD W/O CM
2 series · 15 of 30 positions shown, 17 images · non-contrast
Comparison: September 10, 2015.

CLINICAL DATA: Unwitnessed fall.

EXAM:
CT HEAD WITHOUT CONTRAST
CT CERVICAL SPINE WITHOUT CONTRAST
TECHNIQUE: Multidetector CT imaging of the head and cervical spine was
performed following the standard protocol without intravenous
contrast. Multiplanar CT image reconstructions of the cervical spine
were also generated.

[Series 2: head wo · axial · 0.49mm/px · z∈[-80,+45]mm · 7 of 35 slices shown, 9 images]
[im 5/35  brain]
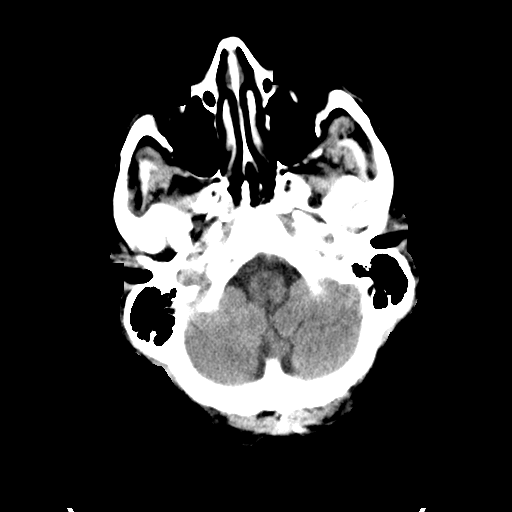
[im 5/35  bone]
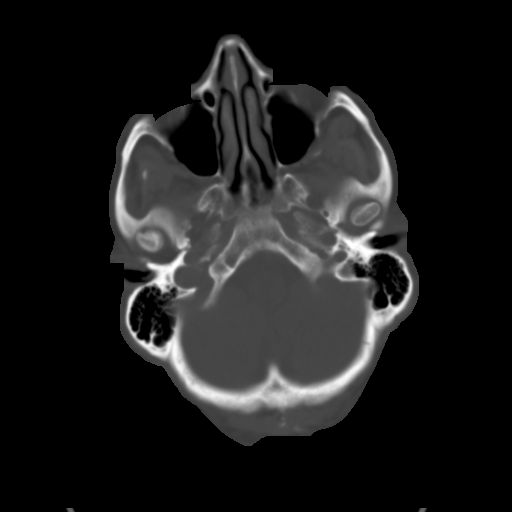
[im 9/35  brain]
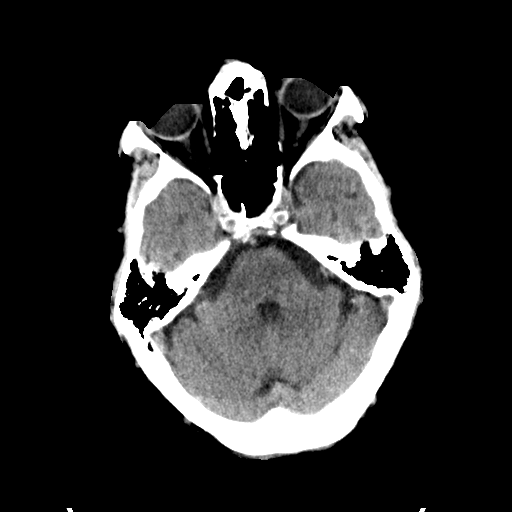
[im 13/35  brain]
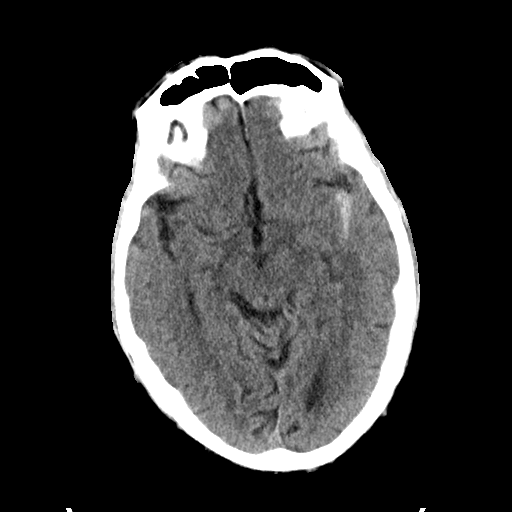
[im 18/35  brain]
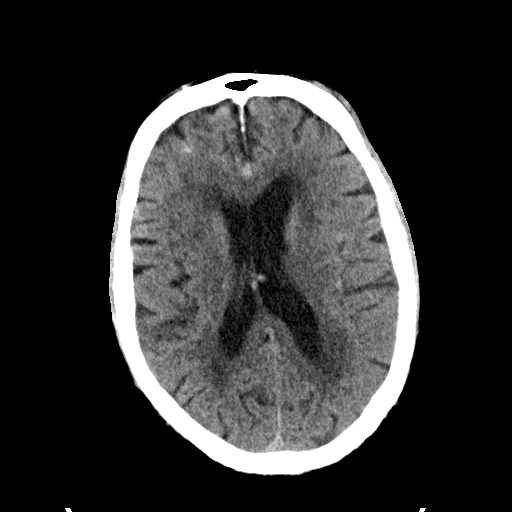
[im 22/35  brain]
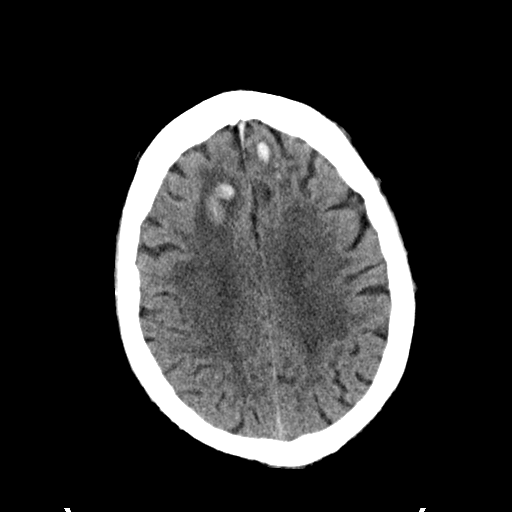
[im 22/35  bone]
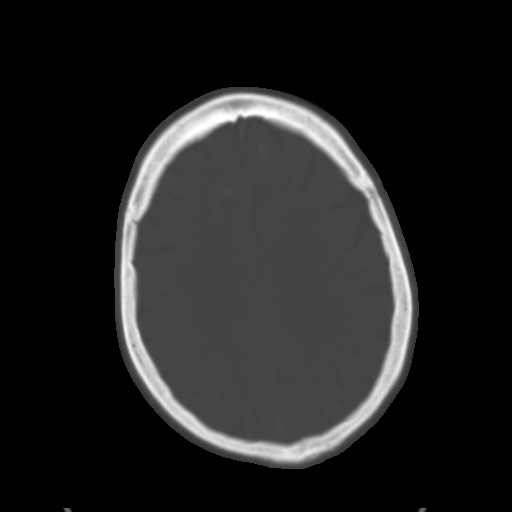
[im 26/35  brain]
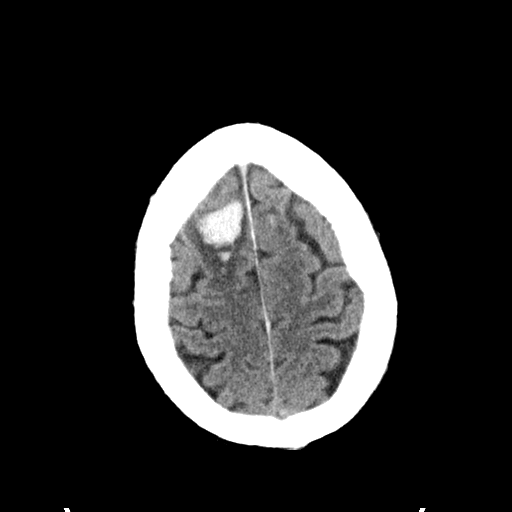
[im 30/35  brain]
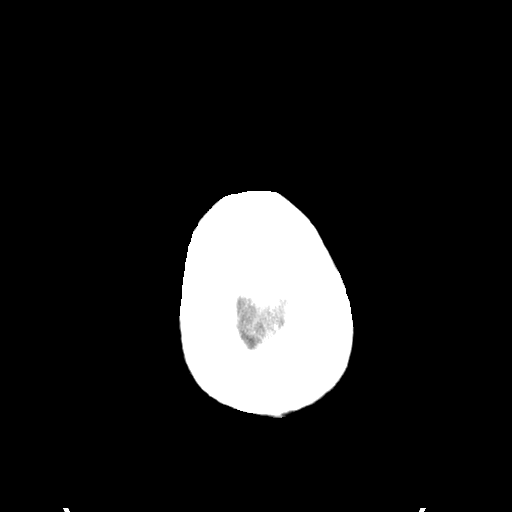

[Series 3: head bone · axial · 0.49mm/px · z∈[-84,+54]mm · 8 of 87 slices shown]
[im 9/87  bone]
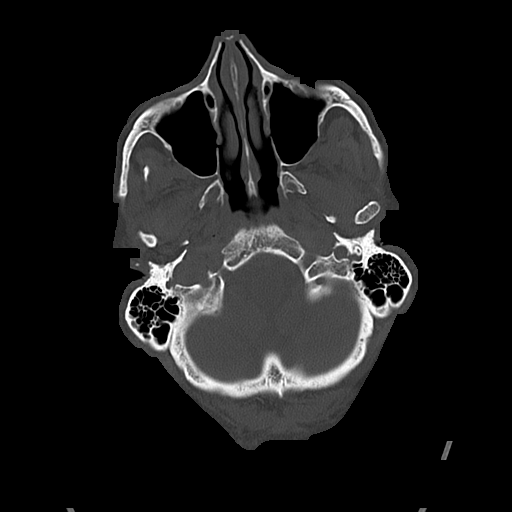
[im 18/87  bone]
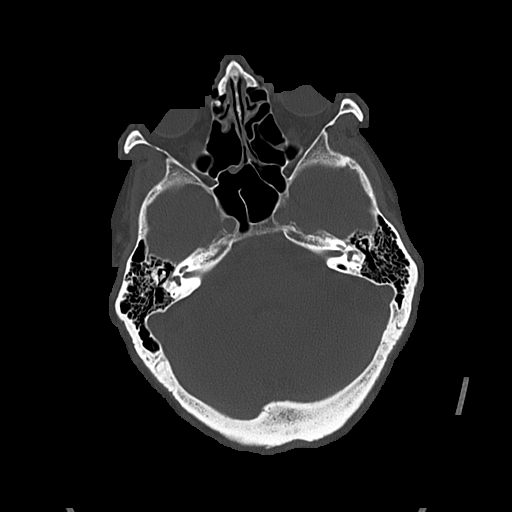
[im 26/87  bone]
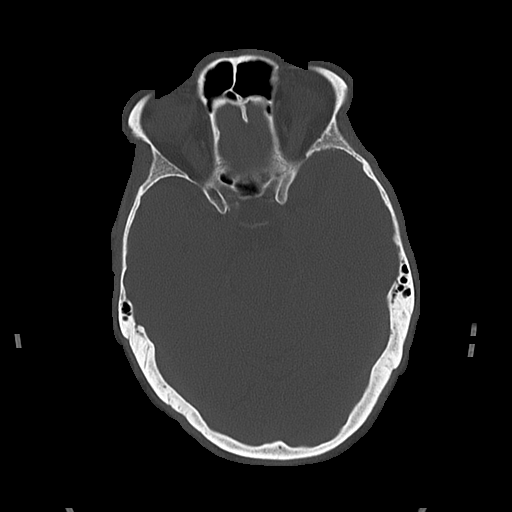
[im 39/87  bone]
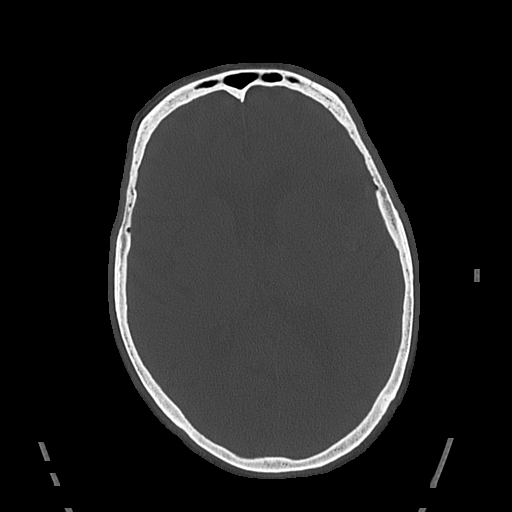
[im 48/87  bone]
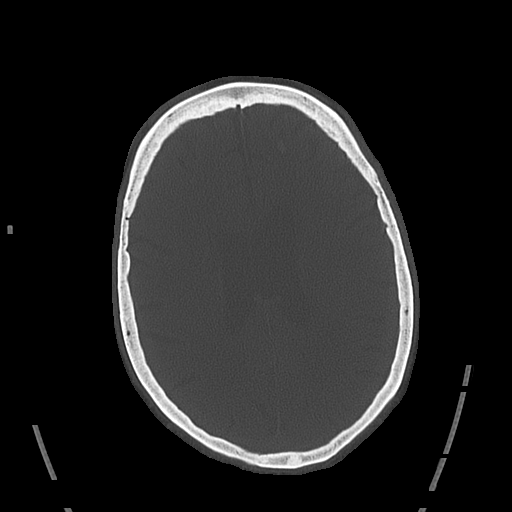
[im 61/87  bone]
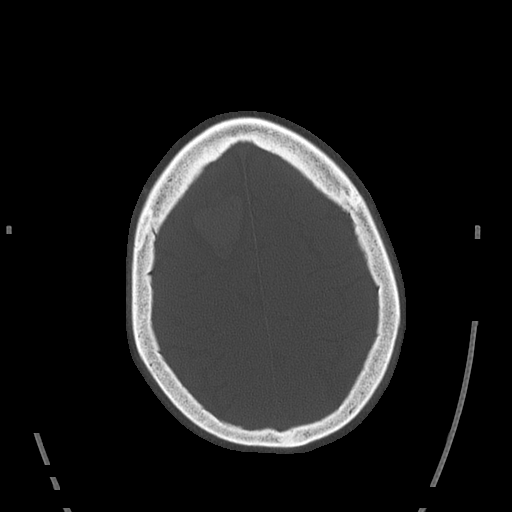
[im 69/87  bone]
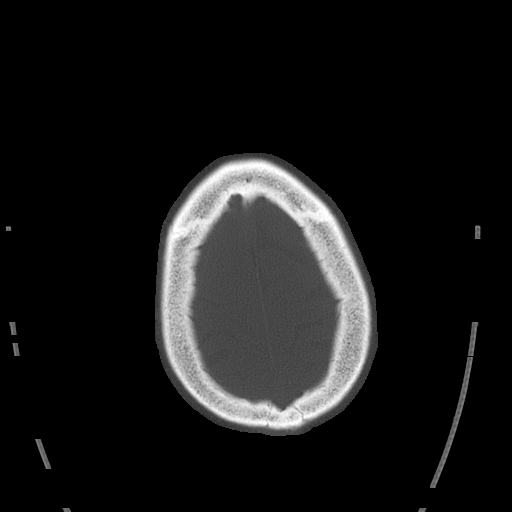
[im 78/87  bone]
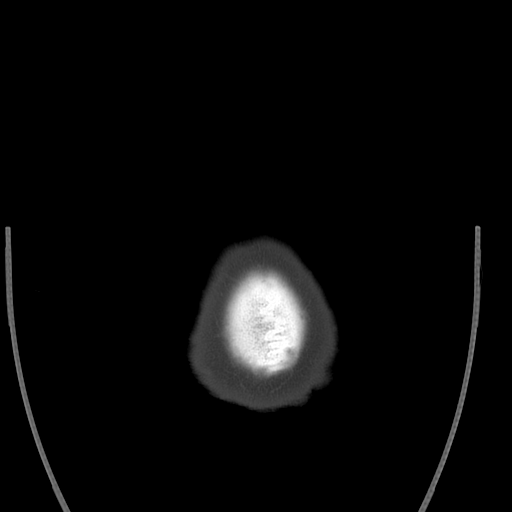

[15 of 30 positions shown; findings below may reference images not displayed]

FINDINGS: CT HEAD FINDINGS

Brain: Moderate amount of subarachnoid hemorrhage is noted in the
left sylvian fissure. 3.5 x 2.4 cm right frontal intraparenchymal
hemorrhage is noted. Smaller left frontal intraparenchymal
hemorrhage is noted. Small amount of subarachnoid hemorrhage is also
noted in the right frontal region. Mild chronic ischemic white
matter disease is noted. Ventricular size is within normal limits.
Small amount of intraventricular hemorrhage is noted in the left
posterior horn. No midline shift is noted. No mass lesion is noted.

Vascular: No hyperdense vessel or unexpected calcification.

Skull: Normal. Negative for fracture or focal lesion.

Sinuses/Orbits: No acute finding.

Other: None.

CT CERVICAL SPINE FINDINGS

Alignment: Normal.

Skull base and vertebrae: No acute fracture. No primary bone lesion
or focal pathologic process.

Soft tissues and spinal canal: No prevertebral fluid or swelling. No
visible canal hematoma.

Disc levels:  Status post surgical anterior fusion of C5-6 and C6-7.

Upper chest: Negative.

Other: Severe degenerative changes seen involving the left-sided
posterior facet joint of C3-4.
IMPRESSION: 1. Bilateral frontal intraparenchymal hemorrhages are noted, with
the largest on the right measuring 3.5 x 2.4 cm. Moderate amount of
subarachnoid hemorrhage is noted in left sylvian fissure. Small
amount of subarachnoid hemorrhage is also noted in the right frontal
region. Small amount of intraventricular hemorrhage is noted in the
left posterior horn. No midline shift is noted. Critical
Value/emergent results were called by telephone at the time of
interpretation on 01/15/2021 at [DATE] to provider REYNHARD JIANE ,
who verbally acknowledged these results.
2. Postsurgical and degenerative changes are noted in the cervical
spine. No fracture or spondylolisthesis is noted.

## 2022-06-15 IMAGING — CT CT CERVICAL SPINE W/O CM
2 series · 10 of 14 positions shown, 12 images · non-contrast
Comparison: September 10, 2015.

CLINICAL DATA: Unwitnessed fall.

EXAM:
CT HEAD WITHOUT CONTRAST
CT CERVICAL SPINE WITHOUT CONTRAST
TECHNIQUE: Multidetector CT imaging of the head and cervical spine was
performed following the standard protocol without intravenous
contrast. Multiplanar CT image reconstructions of the cervical spine
were also generated.

[Series 4: c spine soft (person_name) · axial · 0.30mm/px · z∈[-266,-134]mm · 5 of 99 slices shown]
[im 17/99  soft-tissue]
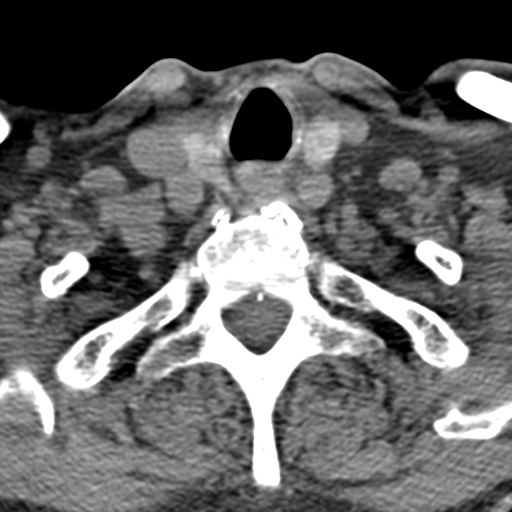
[im 33/99  soft-tissue]
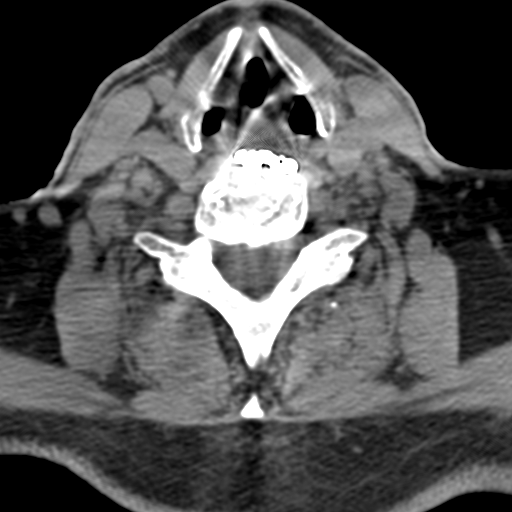
[im 50/99  soft-tissue]
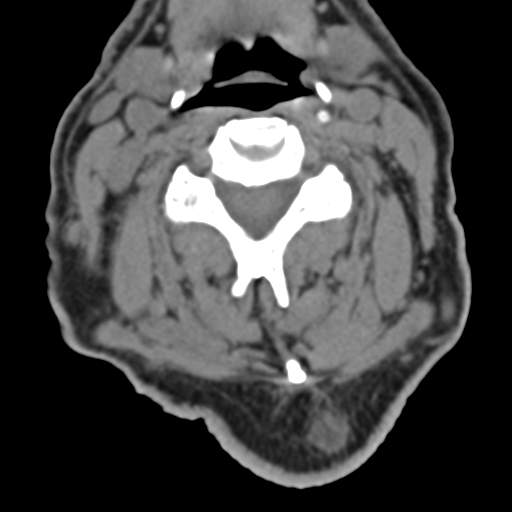
[im 66/99  soft-tissue]
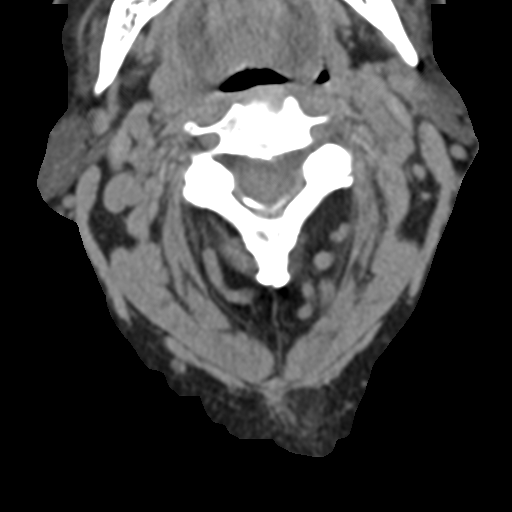
[im 82/99  soft-tissue]
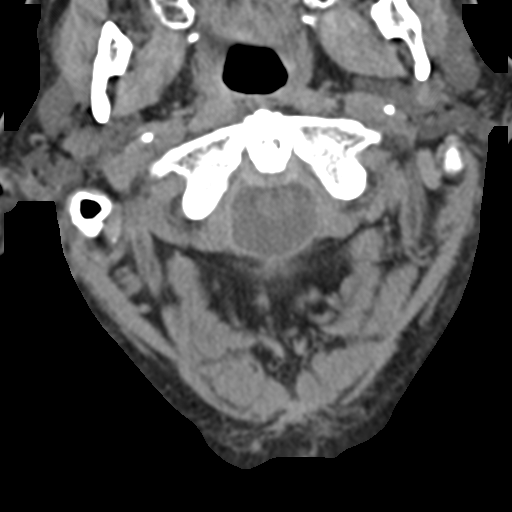

[Series 7: orthogonal axials · axial · 0.21mm/px · z∈[-280,-141]mm · 5 of 103 slices shown, 7 images]
[im 18/103  soft-tissue]
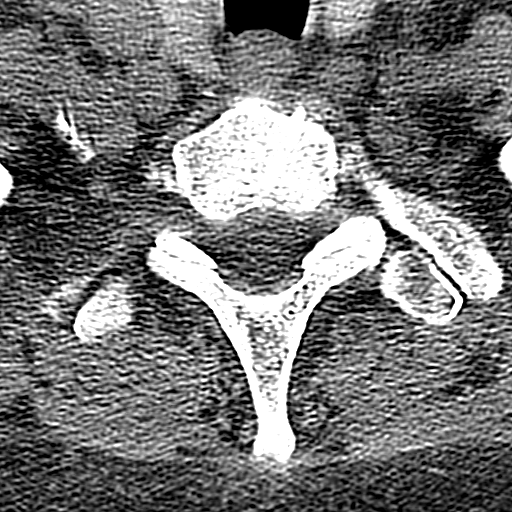
[im 18/103  bone]
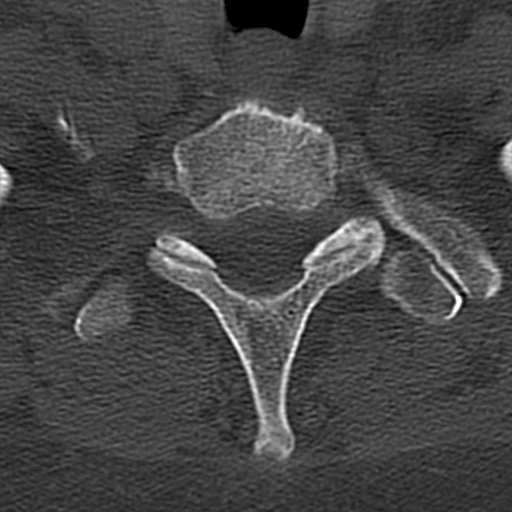
[im 35/103  bone]
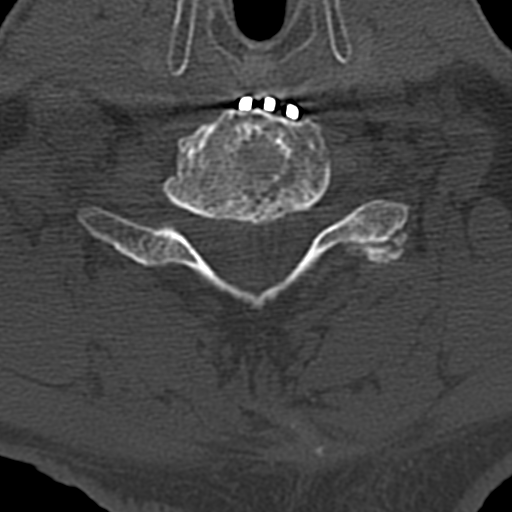
[im 52/103  bone]
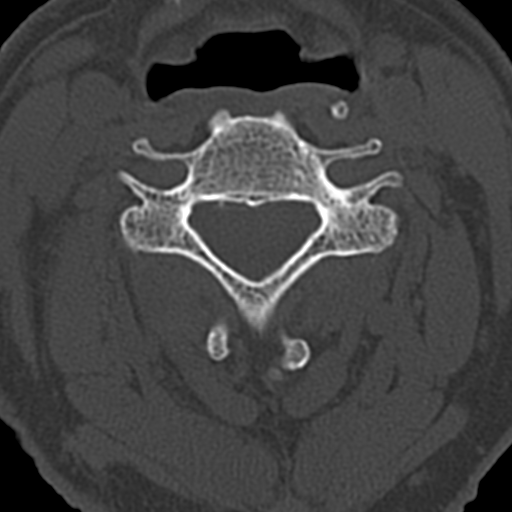
[im 69/103  bone]
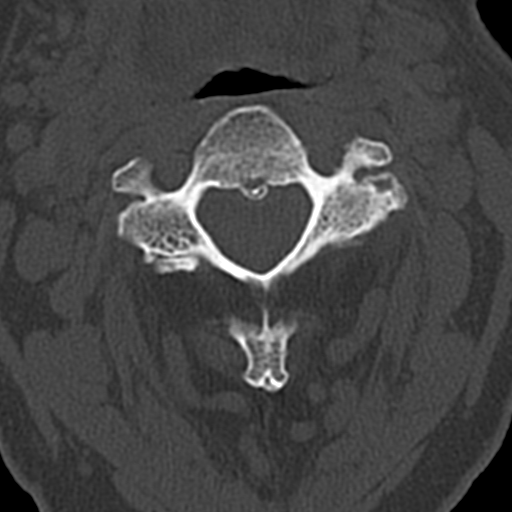
[im 86/103  soft-tissue]
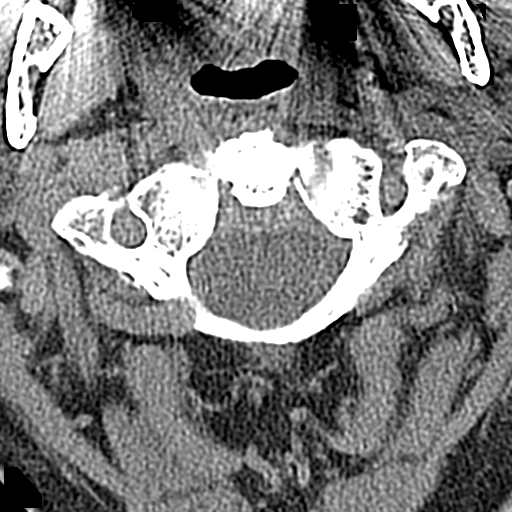
[im 86/103  bone]
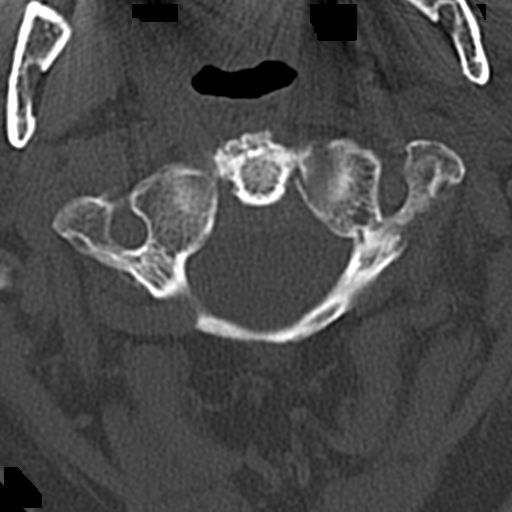

[10 of 14 positions shown; findings below may reference images not displayed]

FINDINGS: CT HEAD FINDINGS

Brain: Moderate amount of subarachnoid hemorrhage is noted in the
left sylvian fissure. 3.5 x 2.4 cm right frontal intraparenchymal
hemorrhage is noted. Smaller left frontal intraparenchymal
hemorrhage is noted. Small amount of subarachnoid hemorrhage is also
noted in the right frontal region. Mild chronic ischemic white
matter disease is noted. Ventricular size is within normal limits.
Small amount of intraventricular hemorrhage is noted in the left
posterior horn. No midline shift is noted. No mass lesion is noted.

Vascular: No hyperdense vessel or unexpected calcification.

Skull: Normal. Negative for fracture or focal lesion.

Sinuses/Orbits: No acute finding.

Other: None.

CT CERVICAL SPINE FINDINGS

Alignment: Normal.

Skull base and vertebrae: No acute fracture. No primary bone lesion
or focal pathologic process.

Soft tissues and spinal canal: No prevertebral fluid or swelling. No
visible canal hematoma.

Disc levels:  Status post surgical anterior fusion of C5-6 and C6-7.

Upper chest: Negative.

Other: Severe degenerative changes seen involving the left-sided
posterior facet joint of C3-4.
IMPRESSION: 1. Bilateral frontal intraparenchymal hemorrhages are noted, with
the largest on the right measuring 3.5 x 2.4 cm. Moderate amount of
subarachnoid hemorrhage is noted in left sylvian fissure. Small
amount of subarachnoid hemorrhage is also noted in the right frontal
region. Small amount of intraventricular hemorrhage is noted in the
left posterior horn. No midline shift is noted. Critical
Value/emergent results were called by telephone at the time of
interpretation on 01/15/2021 at [DATE] to provider REYNHARD JIANE ,
who verbally acknowledged these results.
2. Postsurgical and degenerative changes are noted in the cervical
spine. No fracture or spondylolisthesis is noted.

## 2022-08-03 ENCOUNTER — Other Ambulatory Visit: Payer: Self-pay | Admitting: Internal Medicine

## 2022-11-19 ENCOUNTER — Other Ambulatory Visit: Payer: Self-pay | Admitting: Internal Medicine

## 2022-12-01 ENCOUNTER — Emergency Department (HOSPITAL_COMMUNITY): Payer: PPO

## 2022-12-01 ENCOUNTER — Inpatient Hospital Stay (HOSPITAL_COMMUNITY)
Admission: EM | Admit: 2022-12-01 | Discharge: 2022-12-09 | DRG: 522 | Disposition: A | Discharge status: Skilled Nursing Facility | Payer: PPO | Attending: Internal Medicine | Admitting: Internal Medicine

## 2022-12-01 ENCOUNTER — Encounter (HOSPITAL_COMMUNITY): Payer: Self-pay

## 2022-12-01 ENCOUNTER — Other Ambulatory Visit: Payer: Self-pay

## 2022-12-01 DIAGNOSIS — S8992XA Unspecified injury of left lower leg, initial encounter: Secondary | ICD-10-CM | POA: Diagnosis not present

## 2022-12-01 DIAGNOSIS — J449 Chronic obstructive pulmonary disease, unspecified: Secondary | ICD-10-CM | POA: Diagnosis not present

## 2022-12-01 DIAGNOSIS — Z8673 Personal history of transient ischemic attack (TIA), and cerebral infarction without residual deficits: Secondary | ICD-10-CM

## 2022-12-01 DIAGNOSIS — F411 Generalized anxiety disorder: Secondary | ICD-10-CM | POA: Diagnosis present

## 2022-12-01 DIAGNOSIS — S72032A Displaced midcervical fracture of left femur, initial encounter for closed fracture: Principal | ICD-10-CM | POA: Diagnosis present

## 2022-12-01 DIAGNOSIS — Z96642 Presence of left artificial hip joint: Secondary | ICD-10-CM | POA: Diagnosis not present

## 2022-12-01 DIAGNOSIS — F418 Other specified anxiety disorders: Secondary | ICD-10-CM | POA: Diagnosis not present

## 2022-12-01 DIAGNOSIS — S0101XA Laceration without foreign body of scalp, initial encounter: Secondary | ICD-10-CM | POA: Diagnosis not present

## 2022-12-01 DIAGNOSIS — I110 Hypertensive heart disease with heart failure: Secondary | ICD-10-CM | POA: Diagnosis not present

## 2022-12-01 DIAGNOSIS — K219 Gastro-esophageal reflux disease without esophagitis: Secondary | ICD-10-CM | POA: Diagnosis not present

## 2022-12-01 DIAGNOSIS — E782 Mixed hyperlipidemia: Secondary | ICD-10-CM | POA: Diagnosis not present

## 2022-12-01 DIAGNOSIS — G4733 Obstructive sleep apnea (adult) (pediatric): Secondary | ICD-10-CM | POA: Diagnosis present

## 2022-12-01 DIAGNOSIS — R911 Solitary pulmonary nodule: Secondary | ICD-10-CM | POA: Diagnosis present

## 2022-12-01 DIAGNOSIS — E860 Dehydration: Secondary | ICD-10-CM | POA: Diagnosis present

## 2022-12-01 DIAGNOSIS — Z471 Aftercare following joint replacement surgery: Secondary | ICD-10-CM | POA: Diagnosis not present

## 2022-12-01 DIAGNOSIS — I130 Hypertensive heart and chronic kidney disease with heart failure and stage 1 through stage 4 chronic kidney disease, or unspecified chronic kidney disease: Secondary | ICD-10-CM | POA: Diagnosis not present

## 2022-12-01 DIAGNOSIS — G20C Parkinsonism, unspecified: Secondary | ICD-10-CM | POA: Diagnosis not present

## 2022-12-01 DIAGNOSIS — E1122 Type 2 diabetes mellitus with diabetic chronic kidney disease: Secondary | ICD-10-CM | POA: Diagnosis present

## 2022-12-01 DIAGNOSIS — M5126 Other intervertebral disc displacement, lumbar region: Secondary | ICD-10-CM | POA: Diagnosis not present

## 2022-12-01 DIAGNOSIS — Z66 Do not resuscitate: Secondary | ICD-10-CM | POA: Diagnosis present

## 2022-12-01 DIAGNOSIS — W19XXXA Unspecified fall, initial encounter: Secondary | ICD-10-CM

## 2022-12-01 DIAGNOSIS — I480 Paroxysmal atrial fibrillation: Secondary | ICD-10-CM | POA: Diagnosis present

## 2022-12-01 DIAGNOSIS — I5032 Chronic diastolic (congestive) heart failure: Secondary | ICD-10-CM | POA: Diagnosis not present

## 2022-12-01 DIAGNOSIS — N400 Enlarged prostate without lower urinary tract symptoms: Secondary | ICD-10-CM | POA: Diagnosis not present

## 2022-12-01 DIAGNOSIS — Z87891 Personal history of nicotine dependence: Secondary | ICD-10-CM | POA: Diagnosis not present

## 2022-12-01 DIAGNOSIS — I509 Heart failure, unspecified: Secondary | ICD-10-CM | POA: Diagnosis not present

## 2022-12-01 DIAGNOSIS — Z86010 Personal history of colonic polyps: Secondary | ICD-10-CM

## 2022-12-01 DIAGNOSIS — Z951 Presence of aortocoronary bypass graft: Secondary | ICD-10-CM

## 2022-12-01 DIAGNOSIS — Z8711 Personal history of peptic ulcer disease: Secondary | ICD-10-CM

## 2022-12-01 DIAGNOSIS — I951 Orthostatic hypotension: Secondary | ICD-10-CM | POA: Diagnosis present

## 2022-12-01 DIAGNOSIS — Z888 Allergy status to other drugs, medicaments and biological substances status: Secondary | ICD-10-CM | POA: Diagnosis not present

## 2022-12-01 DIAGNOSIS — Z4889 Encounter for other specified surgical aftercare: Secondary | ICD-10-CM | POA: Diagnosis not present

## 2022-12-01 DIAGNOSIS — I2510 Atherosclerotic heart disease of native coronary artery without angina pectoris: Secondary | ICD-10-CM | POA: Diagnosis present

## 2022-12-01 DIAGNOSIS — Z4789 Encounter for other orthopedic aftercare: Secondary | ICD-10-CM | POA: Diagnosis not present

## 2022-12-01 DIAGNOSIS — M25552 Pain in left hip: Secondary | ICD-10-CM | POA: Diagnosis not present

## 2022-12-01 DIAGNOSIS — R9431 Abnormal electrocardiogram [ECG] [EKG]: Secondary | ICD-10-CM | POA: Diagnosis not present

## 2022-12-01 DIAGNOSIS — J4489 Other specified chronic obstructive pulmonary disease: Secondary | ICD-10-CM | POA: Diagnosis present

## 2022-12-01 DIAGNOSIS — Z811 Family history of alcohol abuse and dependence: Secondary | ICD-10-CM

## 2022-12-01 DIAGNOSIS — R58 Hemorrhage, not elsewhere classified: Secondary | ICD-10-CM | POA: Diagnosis not present

## 2022-12-01 DIAGNOSIS — E119 Type 2 diabetes mellitus without complications: Secondary | ICD-10-CM | POA: Diagnosis not present

## 2022-12-01 DIAGNOSIS — R079 Chest pain, unspecified: Secondary | ICD-10-CM | POA: Diagnosis not present

## 2022-12-01 DIAGNOSIS — Z79899 Other long term (current) drug therapy: Secondary | ICD-10-CM | POA: Diagnosis not present

## 2022-12-01 DIAGNOSIS — F0284 Dementia in other diseases classified elsewhere, unspecified severity, with anxiety: Secondary | ICD-10-CM | POA: Diagnosis present

## 2022-12-01 DIAGNOSIS — I672 Cerebral atherosclerosis: Secondary | ICD-10-CM | POA: Diagnosis not present

## 2022-12-01 DIAGNOSIS — S72002A Fracture of unspecified part of neck of left femur, initial encounter for closed fracture: Secondary | ICD-10-CM

## 2022-12-01 DIAGNOSIS — N179 Acute kidney failure, unspecified: Secondary | ICD-10-CM

## 2022-12-01 DIAGNOSIS — R509 Fever, unspecified: Secondary | ICD-10-CM | POA: Diagnosis not present

## 2022-12-01 DIAGNOSIS — Z981 Arthrodesis status: Secondary | ICD-10-CM | POA: Diagnosis not present

## 2022-12-01 DIAGNOSIS — S72002D Fracture of unspecified part of neck of left femur, subsequent encounter for closed fracture with routine healing: Secondary | ICD-10-CM | POA: Diagnosis not present

## 2022-12-01 DIAGNOSIS — E872 Acidosis, unspecified: Secondary | ICD-10-CM | POA: Diagnosis present

## 2022-12-01 DIAGNOSIS — K449 Diaphragmatic hernia without obstruction or gangrene: Secondary | ICD-10-CM | POA: Diagnosis not present

## 2022-12-01 DIAGNOSIS — E785 Hyperlipidemia, unspecified: Secondary | ICD-10-CM | POA: Diagnosis present

## 2022-12-01 DIAGNOSIS — F32A Depression, unspecified: Secondary | ICD-10-CM | POA: Diagnosis present

## 2022-12-01 DIAGNOSIS — S199XXA Unspecified injury of neck, initial encounter: Secondary | ICD-10-CM | POA: Diagnosis not present

## 2022-12-01 DIAGNOSIS — W19XXXD Unspecified fall, subsequent encounter: Secondary | ICD-10-CM | POA: Diagnosis not present

## 2022-12-01 DIAGNOSIS — I10 Essential (primary) hypertension: Secondary | ICD-10-CM | POA: Diagnosis not present

## 2022-12-01 DIAGNOSIS — N1831 Chronic kidney disease, stage 3a: Secondary | ICD-10-CM | POA: Diagnosis not present

## 2022-12-01 DIAGNOSIS — S3992XA Unspecified injury of lower back, initial encounter: Secondary | ICD-10-CM | POA: Diagnosis not present

## 2022-12-01 DIAGNOSIS — Z823 Family history of stroke: Secondary | ICD-10-CM

## 2022-12-01 DIAGNOSIS — M79605 Pain in left leg: Secondary | ICD-10-CM | POA: Diagnosis not present

## 2022-12-01 DIAGNOSIS — Y92009 Unspecified place in unspecified non-institutional (private) residence as the place of occurrence of the external cause: Secondary | ICD-10-CM | POA: Diagnosis not present

## 2022-12-01 DIAGNOSIS — I25119 Atherosclerotic heart disease of native coronary artery with unspecified angina pectoris: Secondary | ICD-10-CM | POA: Diagnosis not present

## 2022-12-01 DIAGNOSIS — Z87442 Personal history of urinary calculi: Secondary | ICD-10-CM | POA: Diagnosis not present

## 2022-12-01 DIAGNOSIS — M419 Scoliosis, unspecified: Secondary | ICD-10-CM | POA: Diagnosis not present

## 2022-12-01 DIAGNOSIS — Z9181 History of falling: Secondary | ICD-10-CM

## 2022-12-01 DIAGNOSIS — Q7649 Other congenital malformations of spine, not associated with scoliosis: Secondary | ICD-10-CM | POA: Diagnosis not present

## 2022-12-01 DIAGNOSIS — Z8679 Personal history of other diseases of the circulatory system: Secondary | ICD-10-CM

## 2022-12-01 DIAGNOSIS — Z8781 Personal history of (healed) traumatic fracture: Secondary | ICD-10-CM | POA: Diagnosis not present

## 2022-12-01 DIAGNOSIS — M1712 Unilateral primary osteoarthritis, left knee: Secondary | ICD-10-CM | POA: Diagnosis not present

## 2022-12-01 DIAGNOSIS — M549 Dorsalgia, unspecified: Secondary | ICD-10-CM | POA: Diagnosis not present

## 2022-12-01 DIAGNOSIS — M47813 Spondylosis without myelopathy or radiculopathy, cervicothoracic region: Secondary | ICD-10-CM | POA: Diagnosis not present

## 2022-12-01 DIAGNOSIS — Z833 Family history of diabetes mellitus: Secondary | ICD-10-CM

## 2022-12-01 DIAGNOSIS — W010XXA Fall on same level from slipping, tripping and stumbling without subsequent striking against object, initial encounter: Secondary | ICD-10-CM | POA: Diagnosis present

## 2022-12-01 DIAGNOSIS — S0990XA Unspecified injury of head, initial encounter: Secondary | ICD-10-CM | POA: Diagnosis not present

## 2022-12-01 DIAGNOSIS — Z8249 Family history of ischemic heart disease and other diseases of the circulatory system: Secondary | ICD-10-CM

## 2022-12-01 DIAGNOSIS — S72045A Nondisplaced fracture of base of neck of left femur, initial encounter for closed fracture: Secondary | ICD-10-CM | POA: Diagnosis not present

## 2022-12-01 DIAGNOSIS — M47812 Spondylosis without myelopathy or radiculopathy, cervical region: Secondary | ICD-10-CM | POA: Diagnosis not present

## 2022-12-01 LAB — TYPE AND SCREEN
ABO/RH(D): A NEG
Antibody Screen: NEGATIVE

## 2022-12-01 LAB — I-STAT CHEM 8, ED
BUN: 20 mg/dL (ref 8–23)
Calcium, Ion: 1.19 mmol/L (ref 1.15–1.40)
Chloride: 106 mmol/L (ref 98–111)
Creatinine, Ser: 1.4 mg/dL — ABNORMAL HIGH (ref 0.61–1.24)
Glucose, Bld: 93 mg/dL (ref 70–99)
HCT: 42 % (ref 39.0–52.0)
Hemoglobin: 14.3 g/dL (ref 13.0–17.0)
Potassium: 3.9 mmol/L (ref 3.5–5.1)
Sodium: 142 mmol/L (ref 135–145)
TCO2: 24 mmol/L (ref 22–32)

## 2022-12-01 LAB — PROTIME-INR
INR: 1.2 (ref 0.8–1.2)
Prothrombin Time: 15.1 seconds (ref 11.4–15.2)

## 2022-12-01 LAB — COMPREHENSIVE METABOLIC PANEL
ALT: 12 U/L (ref 0–44)
AST: 18 U/L (ref 15–41)
Albumin: 3.9 g/dL (ref 3.5–5.0)
Alkaline Phosphatase: 63 U/L (ref 38–126)
Anion gap: 15 (ref 5–15)
BUN: 19 mg/dL (ref 8–23)
CO2: 22 mmol/L (ref 22–32)
Calcium: 9.1 mg/dL (ref 8.9–10.3)
Chloride: 104 mmol/L (ref 98–111)
Creatinine, Ser: 1.43 mg/dL — ABNORMAL HIGH (ref 0.61–1.24)
GFR, Estimated: 48 mL/min — ABNORMAL LOW (ref >60)
Glucose, Bld: 96 mg/dL (ref 70–99)
Potassium: 3.9 mmol/L (ref 3.5–5.1)
Sodium: 141 mmol/L (ref 135–145)
Total Bilirubin: 0.6 mg/dL (ref 0.3–1.2)
Total Protein: 6.7 g/dL (ref 6.5–8.1)

## 2022-12-01 LAB — URINALYSIS, ROUTINE W REFLEX MICROSCOPIC
Bacteria, UA: NONE SEEN
Bilirubin Urine: NEGATIVE
Glucose, UA: 50 mg/dL — AB
Hgb urine dipstick: NEGATIVE
Ketones, ur: 20 mg/dL — AB
Leukocytes,Ua: NEGATIVE
Nitrite: NEGATIVE
Protein, ur: NEGATIVE mg/dL
Specific Gravity, Urine: 1.017 (ref 1.005–1.030)
pH: 5 (ref 5.0–8.0)

## 2022-12-01 LAB — SAMPLE TO BLOOD BANK

## 2022-12-01 LAB — CBC
HCT: 41.6 % (ref 39.0–52.0)
Hemoglobin: 14.3 g/dL (ref 13.0–17.0)
MCH: 33.5 pg (ref 26.0–34.0)
MCHC: 34.4 g/dL (ref 30.0–36.0)
MCV: 97.4 fL (ref 80.0–100.0)
Platelets: 202 10*3/uL (ref 150–400)
RBC: 4.27 MIL/uL (ref 4.22–5.81)
RDW: 13.4 % (ref 11.5–15.5)
WBC: 10.3 10*3/uL (ref 4.0–10.5)
nRBC: 0 % (ref 0.0–0.2)

## 2022-12-01 LAB — ETHANOL: Alcohol, Ethyl (B): <10 mg/dL (ref <10)

## 2022-12-01 LAB — MAGNESIUM: Magnesium: 1.7 mg/dL (ref 1.7–2.4)

## 2022-12-01 LAB — LACTIC ACID, PLASMA: Lactic Acid, Venous: 2.1 mmol/L (ref 0.5–1.9)

## 2022-12-01 MED ORDER — ACETAMINOPHEN 325 MG PO TABS
650.0000 mg | ORAL_TABLET | Freq: Four times a day (QID) | ORAL | Status: DC | PRN
  Filled 2022-12-01: qty 2

## 2022-12-01 MED ORDER — PANTOPRAZOLE SODIUM 40 MG IV SOLR
40.0000 mg | INTRAVENOUS | Status: DC
  Administered 2022-12-01 – 2022-12-05 (×5): 40 mg via INTRAVENOUS
  Filled 2022-12-01 (×5): qty 10

## 2022-12-01 MED ORDER — MELATONIN 3 MG PO TABS
3.0000 mg | ORAL_TABLET | Freq: Every evening | ORAL | Status: DC | PRN
  Administered 2022-12-02 – 2022-12-05 (×4): 3 mg via ORAL
  Filled 2022-12-01 (×4): qty 1

## 2022-12-01 MED ORDER — LIDOCAINE-EPINEPHRINE (PF) 2 %-1:200000 IJ SOLN
20.0000 mL | Freq: Once | INTRAMUSCULAR | Status: AC
  Administered 2022-12-01: 20 mL
  Filled 2022-12-01: qty 20

## 2022-12-01 MED ORDER — LIDOCAINE-EPINEPHRINE-TETRACAINE (LET) TOPICAL GEL
3.0000 mL | Freq: Once | TOPICAL | Status: AC
  Administered 2022-12-01: 3 mL via TOPICAL
  Filled 2022-12-01: qty 3

## 2022-12-01 MED ORDER — ONDANSETRON HCL 4 MG/2ML IJ SOLN
4.0000 mg | Freq: Four times a day (QID) | INTRAMUSCULAR | Status: DC | PRN
  Administered 2022-12-03: 4 mg via INTRAVENOUS
  Filled 2022-12-01: qty 2

## 2022-12-01 MED ORDER — FENTANYL CITRATE PF 50 MCG/ML IJ SOSY
50.0000 ug | PREFILLED_SYRINGE | INTRAMUSCULAR | Status: DC | PRN
  Administered 2022-12-01 – 2022-12-02 (×5): 50 ug via INTRAVENOUS
  Filled 2022-12-01 (×5): qty 1

## 2022-12-01 MED ORDER — MIDODRINE HCL 5 MG PO TABS
5.0000 mg | ORAL_TABLET | Freq: Two times a day (BID) | ORAL | Status: DC
  Administered 2022-12-01 – 2022-12-05 (×7): 5 mg via ORAL
  Filled 2022-12-01 (×9): qty 1

## 2022-12-01 MED ORDER — NALOXONE HCL 0.4 MG/ML IJ SOLN: 0.4000 mg | INTRAMUSCULAR | Status: DC | PRN

## 2022-12-01 MED ORDER — ACETAMINOPHEN 650 MG RE SUPP: 650.0000 mg | Freq: Four times a day (QID) | RECTAL | Status: DC | PRN

## 2022-12-01 MED ORDER — HYDROMORPHONE HCL 1 MG/ML IJ SOLN
2.0000 mg | Freq: Once | INTRAMUSCULAR | Status: AC
  Administered 2022-12-01: 2 mg via INTRAVENOUS
  Filled 2022-12-01: qty 2

## 2022-12-01 MED ORDER — HYDROMORPHONE HCL 1 MG/ML IJ SOLN
0.5000 mg | Freq: Once | INTRAMUSCULAR | Status: AC
  Administered 2022-12-01: 0.5 mg via INTRAVENOUS
  Filled 2022-12-01: qty 1

## 2022-12-01 MED ORDER — LORAZEPAM 2 MG/ML IJ SOLN: 0.5000 mg | Freq: Every day | INTRAMUSCULAR | Status: DC | PRN

## 2022-12-01 MED ORDER — SODIUM CHLORIDE 0.9 % IV BOLUS
1000.0000 mL | Freq: Once | INTRAVENOUS | Status: AC
  Administered 2022-12-01: 1000 mL via INTRAVENOUS

## 2022-12-01 MED ORDER — MORPHINE SULFATE (PF) 4 MG/ML IV SOLN
4.0000 mg | Freq: Once | INTRAVENOUS | Status: AC
  Administered 2022-12-01: 4 mg via INTRAVENOUS
  Filled 2022-12-01: qty 1

## 2022-12-01 NOTE — ED Notes (Signed)
Patient transported to CT with primary RN. 

## 2022-12-01 NOTE — H&P (Signed)
History and Physical      John [name] HYQ:[id number] DOB: [birth year] DOA: 12/01/2022  PCP: Unk Pinto, MD  Patient coming from: home   I have personally briefly reviewed patient's old medical records in [place]  Chief Complaint: left hip pain  HPI: John [name] is a 88 y.o. male with medical history significant for coronary artery disease status two-vessel CABG in 1996, paroxysmal atrial fibrillation, GAD,post due to diabetes mellitus, hypertension, hyperlipidemia, obstructive sleep apnea not on nocturnal CPAP, chronic diastolic heart failure, who is admitted to [facility] on 12/01/2022 with acute left femoral neck fracture after presenting from home to [facility] ED complaining of left hip pain.   The patient reports that he experienced a ground-level fall at home earlier today that occurred when he became dizzy when attempting to rise from a seated to standing position.  This dizziness was associated with some lightheadedness, with the patient reporting that the symptoms were contributory to his ensuing fall to the floor, in which he fell backwards into his left, hitting his left hip as well as the posterior left portion of his head as the principal point of contact with the floor.   As a result of this fall, the patient reports immediate development of sharp left hip pain, with radiation into the left groin. States that this pain has been constant since onset, with exacerbation when attempting to move the left lower extremity.  As a consequence of the associated intensity of this discomfort, reports that he is unable to bear weight on the left lower extremity at this time.  He also notes some mild left knee discomfort that started as a consequence of the above fall.  Denies any acute numbness or paresthesias in bilateral lower extremities, and confirms that left hip representations a native joint.  He conveys the above fall was not associated any loss of consciousness.   Denies any preceding or associated chest pain, shortness of breath, palpitations, nausea, vomiting.  Reports a very mild left posterior headache after striking his head as a component of the above fall.  Denies any associated neck pain, blurry vision, or diplopia.  Denies any associated acute focal weakness.  The carries a diagnosis of paroxysmal atrial fibrillation, for which he was previously anticoagulated on Xarelto, although this medication was stopped multiple years ago in the context of perception of increased fall risk in the context of a documented history of recurrent orthostatic hypotension resulting in recurrent falls.  The setting, the patient is on scheduled midodrine at home.  He was also previously on Plavix, which was stopped 1 to 2 years ago in the setting of an intracranial hemorrhage that occurred as a consequence of another ground-level fall.  As a consequence of the above discontinuation of Xarelto and Plavix, the patient is not currently on any blood thinners at home, including no aspirin.  He has a history of CAD status post two-vessel CABG in 1996.  He also has a history of chronic diastolic heart failure, with most recent echocardiogram performed in November 2020 notable for LVEF 50 to 95%, grade 1 diastolic dysfunction and normal right ventricular systolic function, trace mitral regurgitation, trivial tricuspid regurgitation and mild aortic regurgitation.  He is not currently on any scheduled diuretic medications as an outpatient, and denies any recent orthopnea, PND, or worsening of peripheral edema.  Per chart review, baseline creatinine noted to be in the range of 1.1-1.4.     ED Course:  Vital signs in the  ED were notable for the following: Afebrile; heart rate 95-96; blood pressure 108/68, which subsequently increased to 142/92 following interval administration of IV fluids, as further detailed below; respiratory rate 16-20, oxygen saturation 99 to 100% on room air.  Labs  were notable for the following: CMP notable for sodium 141, potassium 3.9, creatinine 1.43, glucose 96, liver enzymes within normal limits.  Lactic acid 2.1.  CBC notable for white blood cell count 10,300, hemoglobin 14.3, platelet count 202.  Urinalysis has been ordered, others are currently pending.  INR currently pending.  Per my interpretation, EKG in ED demonstrated the following: Interpretation of EKG is limited by the presence of artifact, but within these confines, appears to be suggestive of atrial fibrillation with heart rate 106, and demonstrates no evidence of overt T wave changes or ST changes, including no evidence of ST elevation.  Imaging and additional notable ED work-up: Plain films of the left hip show a transverse fracture of the left femoral neck with varus angulation of the fracture fragments.  Additional imaging performed in the ED today notable for the following: CT head shows no evidence of acute intracranial process, including no evidence of intracranial hemorrhage or any evidence of acute infarct.  CT cervical spine shows no evidence of acute cervical spine fracture or subluxation injury.  CT thoracic spine shows no evidence of acute traumatic thoracic spine process.  CT lumbar spine shows slight invagination of the superior endplate of L1 vertebral body, which appears chronic, and otherwise shows no evidence of acute lumbar spine fracture.  Plain films of the left knee show no evidence of acute fracture.  Will view chest x-ray, in comparison to most recent prior chest x-ray from 11/25/2021, and per formal radiology read, shows nodular area of scarring suggested in the right midlung, which is more prominent than seen on prior study, possibly indicating a developing pulmonary nodule, with associated radiology recommendations for CT correlation, will noting that today's chest x-ray otherwise shows no evidence of acute cardiopulmonary process, including no evidence of infiltrate, edema,  effusion, or pneumothorax.  EDP discussed the patient's case and imaging with the on-call orthopedic surgeon, Dr. Sammuel Hines of [facility], who recommended admission to the hospitalist service for further evaluation and management of acute left femoral neck fracture.  Dr. Sammuel Hines  to formally consult, and will evaluate the patient in the morning. Additionally, Dr. Sammuel Hines requests that the patient be kept n.p.o. after midnight.   While in the ED, the following were administered: Dilaudid 0.5 mg IV x 1, morphine 4 mg IV x 1, normal saline x 1 L bolus.  Subsequently, the patient was admitted for further evaluation management presenting acute left femoral neck fracture after ground-level fall in the setting of suspected presyncopal episode without associated loss of consciousness.     Review of Systems: As per HPI otherwise 10 point review of systems negative.   Past Medical History:  Diagnosis Date   Anxiety    Arthritis    "lower back; left ankle" (03/20/2014)   ASTHMA    Blood transfusion    "related to OHS, ulcers"   CAD (coronary artery disease)    a. s/p 2V CABG 1996. b. Last cath 12/2016 -> patent grafts, med rx.   CAD, ARTERY BYPASS GRAFT    Cataract    CKD (chronic kidney disease), stage II    Colon polyps    adenomatous polyps   COPD    Depression    Diabetes mellitus without complication ([place])  no meds now   Diverticulosis    Duodenal ulcer without hemorrhage or perforation 12/29/2011   GASTROESOPHAGEAL REFLUX DISEASE    HYPERLIPIDEMIA    Insomnia    Leukodystrophy    Memory loss    Nephrolithiasis    "I've had over 320 kidney stones" (03/20/2014)   NEPHROLITHIASIS, HX OF 03/12/2009   Parkinsonism    Sleep apnea    does not use CPAP   Stroke (HCC)    TIA   SUPRAVENTRICULAR TACHYCARDIA, HX OF    SVT (supraventricular tachycardia) 03/12/2009   TIA    Ulcer     Past Surgical History:  Procedure Laterality Date   ANKLE FRACTURE SURGERY Left ~ 2012   CARDIAC  CATHETERIZATION     COLONOSCOPY     CORONARY ARTERY BYPASS GRAFT  1996   CABG X2   CYSTOSCOPY WITH URETEROSCOPY, STONE BASKETRY AND STENT PLACEMENT     Cystourethroscopy with stent removal.     EXCISIONAL HEMORRHOIDECTOMY     GASTRECTOMY     HIATAL HERNIA REPAIR     IRRIGATION AND DEBRIDEMENT SEBACEOUS CYST     "off my back   KNEE ARTHROSCOPY WITH MEDIAL MENISECTOMY Left 08/24/2019   Procedure: LEFT KNEE ARTHROSCOPY WITH PARTIAL MEDIAL MENISCECTOMY;  Surgeon: Leandrew Koyanagi, MD;  Location: [facility];  Service: Orthopedics;  Laterality: Left;   LEFT HEART CATH AND CORS/GRAFTS ANGIOGRAPHY N/A 12/23/2016   Procedure: Left Heart Cath and Cors/Grafts Angiography;  Surgeon: Burnell Blanks, MD;  Location: [facility];  Service: Cardiovascular;  Laterality: N/A;   POLYPECTOMY     PROSTATE SURGERY     "took the center of my prostate out"   UPPER GASTROINTESTINAL ENDOSCOPY     VAGOTOMY      Social History:  reports that he quit smoking about 60 years ago. His smoking use included cigarettes. He smoked an average of .5 packs per day. He quit smokeless tobacco use about 4 years ago.  His smokeless tobacco use included chew. He reports that he does not drink alcohol and does not use drugs.   Allergies  Allergen Reactions   Gabapentin Other (See Comments)    Severe confusion   Prozac [Fluoxetine Hcl] Other (See Comments)    Patient state that it does not agree with him   Soma [Carisoprodol] Other (See Comments)    Patient stated that it does not agree with him   Beta Adrenergic Blockers Other (See Comments)    REACTION: Bradycardia    Family History  Problem Relation Age of Onset   Alcohol abuse Father    Stroke Mother    Heart disease Brother    Diabetes Brother    Heart disease Paternal Grandmother    Colon cancer Neg Hx    Stomach cancer Neg Hx    Esophageal cancer Neg Hx    Rectal cancer Neg Hx     Family history reviewed and not pertinent     Prior to Admission medications   Medication Sig Start Date End Date Taking? Authorizing Provider  acetaminophen (PAIN RELIEF EXTRA STRENGTH) 500 MG tablet Take 2 tablets (1,000 mg total) by mouth every 6 (six) hours as needed (for pain). 12/03/21   Rai, Vernelle Emerald, MD  ALPRAZolam (XANAX XR) 0.5 MG 24 hr tablet Take 1 tablet  Daily at [facility] 05/20/22   Unk Pinto, MD  amLODipine (NORVASC) 5 MG tablet Take 1 tablet (5 mg total) by mouth daily. 12/03/21  Rai, Vernelle Emerald, MD  atorvastatin (LIPITOR) 40 MG tablet Take  1 tablet  Daily  for Cholesterol 12/17/21   Unk Pinto, MD  Cholecalciferol 50 MCG (2000 UT) TABS Take 1  capsule  Daily Patient taking differently: Take 2,000 Units by mouth daily. 10/12/21   Unk Pinto, MD  escitalopram (LEXAPRO) 10 MG tablet Take 1 tablet Daily                               TAKE   BY                                MOUTH 05/20/22   Unk Pinto, MD  finasteride (PROSCAR) 5 MG tablet Take  1 tablet  Daily  for Prostate Patient taking differently: Take 5 mg by mouth daily. for Prostate 10/12/21   Unk Pinto, MD  glucose blood (FREESTYLE LITE) test strip TEST BLOOD SUGAR ONCE DAILY 06/01/19   Unk Pinto, MD  Lancets (FREESTYLE) lancets 1 each by Other route as needed for other. Use as instructed    [provider]  loperamide (ANTI-DIARRHEAL) 2 MG tablet Take  1 tablet  2 x /day 05/20/22   Unk Pinto, MD  loperamide (IMODIUM) 2 MG capsule Take 1 tablet qod - hs  - even days Patient taking differently: Take 2 mg by mouth 2 (two) times daily. 01/30/21   Unk Pinto, MD  midodrine (PROAMATINE) 5 MG tablet Take 1 tablet 2 x /daily with Meals for Low BP 12/17/21   Unk Pinto, MD  nitroGLYCERIN (NITROSTAT) 0.4 MG SL tablet Sig: 1 tablet under tongue every 3 to 5 minutes as needed for Angina - Please Dispense # 2 bottles of # 25 tabs Patient not taking: Reported on 11/26/2021 07/17/18   Unk Pinto, MD   pantoprazole (PROTONIX) 40 MG tablet Take  1 tablet  Daily  to Prevent Heartburn & Indigestion Patient taking differently: Take 40 mg by mouth daily. to Prevent Heartburn & Indigestion 10/12/21   Unk Pinto, MD  polyethylene glycol (MIRALAX / GLYCOLAX) 17 g packet Take 17 g by mouth daily as needed for moderate constipation. 01/19/21   Hosie Poisson, MD  QUEtiapine (SEROQUEL) 25 MG tablet Take  1 tablet 3 x /day before Meals                                                       /                                          TAKE                                        BY                                              MOUTH 08/03/22   Unk Pinto, MD  QUEtiapine (SEROQUEL) 50  MG tablet Take 1  tablet  at Bedtime 05/20/22   Unk Pinto, MD  tamsulosin (FLOMAX) 0.4 MG CAPS capsule Take 1 capsule Daily for Prosate Patient taking differently: Take 0.4 mg by mouth daily. for Prosate 09/09/21   Unk Pinto, MD  traMADol (ULTRAM) 50 MG tablet Take 2 tablets (100 mg total) by mouth every 8 (eight) hours as needed for severe pain or moderate pain (headache/migraine). 12/03/21   Rai, Vernelle Emerald, MD  vitamin C (ASCORBIC ACID) 500 MG tablet Take 1 tablet Daily  Patient taking differently: Take 500 mg by mouth daily. 10/14/21   Unk Pinto, MD     Objective    Physical Exam: Vitals:   12/01/22 1730 12/01/22 1746 12/01/22 1939 12/01/22 2005  BP: 108/68 (!) 142/92  (!) 146/77  Pulse:  96  95  Resp:  16  20  Temp:  98.7 F (37.1 C)    TempSrc:  Oral    SpO2:  100%  99%  Weight:   83 kg   Height:   6' (1.829 m)     General: appears to be stated age; alert, oriented Skin: warm, dry, no rash Head:  small lac to the left posterior aspect of the scalp, p lack repair by ED physician via anticipated intervention with staples.  Mouth:  Oral mucosa membranes appear moist, normal dentition Neck: supple; trachea midline Heart:  irregular; did not appreciate any M/R/G Lungs: CTAB, did not  appreciate any wheezes, rales, or rhonchi Abdomen: + BS; soft, ND, NT Vascular: 2+ pedal pulses b/l; 2+ radial pulses b/l Extremities: no peripheral edema, no muscle wasting Neuro: sensation intact in upper and lower extremities b/l; deferred evaluation of strength in the left lower extremity in the context of presenting acute left femoral neck fracture.    Labs on Admission: I have personally reviewed following labs and imaging studies  CBC: Recent Labs  Lab 12/01/22 1757 12/01/22 1807  WBC 10.3  --   HGB 14.3 14.3  HCT 41.6 42.0  MCV 97.4  --   PLT 202  --    Basic Metabolic Panel: Recent Labs  Lab 12/01/22 1757 12/01/22 1807  NA 141 142  K 3.9 3.9  CL 104 106  CO2 22  --   GLUCOSE 96 93  BUN 19 20  CREATININE 1.43* 1.40*  CALCIUM 9.1  --    GFR: Estimated Creatinine Clearance: 42.3 mL/min (A) (by C-G formula based on SCr of 1.4 mg/dL (H)). Liver Function Tests: Recent Labs  Lab 12/01/22 1757  AST 18  ALT 12  ALKPHOS 63  BILITOT 0.6  PROT 6.7  ALBUMIN 3.9   No results for input(s): "LIPASE", "AMYLASE" in the last 168 hours. No results for input(s): "AMMONIA" in the last 168 hours. Coagulation Profile: No results for input(s): "INR", "PROTIME" in the last 168 hours. Cardiac Enzymes: No results for input(s): "CKTOTAL", "CKMB", "CKMBINDEX", "TROPONINI" in the last 168 hours. BNP (last 3 results) No results for input(s): "PROBNP" in the last 8760 hours. HbA1C: No results for input(s): "HGBA1C" in the last 72 hours. CBG: No results for input(s): "GLUCAP" in the last 168 hours. Lipid Profile: No results for input(s): "CHOL", "HDL", "LDLCALC", "TRIG", "CHOLHDL", "LDLDIRECT" in the last 72 hours. Thyroid Function Tests: No results for input(s): "TSH", "T4TOTAL", "FREET4", "T3FREE", "THYROIDAB" in the last 72 hours. Anemia Panel: No results for input(s): "VITAMINB12", "FOLATE", "FERRITIN", "TIBC", "IRON", "RETICCTPCT" in the last 72 hours. Urine analysis:     Component Value Date/Time  COLORURINE AMBER (A) 01/16/2021 0005   APPEARANCEUR HAZY (A) 01/16/2021 0005   LABSPEC 1.020 01/16/2021 0005   PHURINE 5.0 01/16/2021 0005   GLUCOSEU 150 (A) 01/16/2021 0005   HGBUR SMALL (A) 01/16/2021 0005   BILIRUBINUR NEGATIVE 01/16/2021 0005   KETONESUR 5 (A) 01/16/2021 0005   PROTEINUR 30 (A) 01/16/2021 0005   UROBILINOGEN 0.2 09/23/2014 1415   NITRITE NEGATIVE 01/16/2021 0005   LEUKOCYTESUR NEGATIVE 01/16/2021 0005    Radiological Exams on Admission: CT THORACIC SPINE WO CONTRAST  Result Date: 12/01/2022 CLINICAL DATA:  Fall.  Back pain. EXAM: CT THORACIC SPINE WITHOUT CONTRAST TECHNIQUE: Multidetector CT images of the thoracic were obtained using the standard protocol without intravenous contrast. RADIATION DOSE REDUCTION: This exam was performed according to the departmental dose-optimization program which includes automated exposure control, adjustment of the mA and/or kV according to patient size and/or use of iterative reconstruction technique. COMPARISON:  Chest radiograph dated 12/01/2022 and 11/25/2021. FINDINGS: Evaluation of this exam is limited due to motion artifact. Alignment: No acute subluxation. Vertebrae: No acute fracture. Paraspinal and other soft tissues: No acute finding. No perispinal fluid collection or hematoma. There are areas of scarring in the lungs bilaterally. Evaluation of the lungs is limited due to respiratory motion. There is a moderate size hiatal hernia. There is coronary vascular calcification. Disc levels: No acute findings.  Degenerative changes. IMPRESSION: No acute/traumatic thoracic spine pathology. Electronically Signed   By: Anner Crete M.D.   On: 12/01/2022 19:34   DG Knee Left Port  Result Date: 12/01/2022 CLINICAL DATA:  Left hip fracture, left thigh pain, level 2 trauma, fall, left hip pain EXAM: PORTABLE LEFT KNEE - 1-2 VIEW COMPARISON:  None 2520 FINDINGS: Degenerative changes in the left knee with medial  compartment narrowing and small osteophyte formation. No evidence of acute fracture or dislocation. No focal bone lesion or bone destruction. No significant effusions. Vascular calcifications in the soft tissues. Degenerative changes have progressed since prior study. IMPRESSION: Mild progressing degenerative changes in the left knee. No acute displaced fractures are identified. Electronically Signed   By: Lucienne Capers M.D.   On: 12/01/2022 19:34   DG Hip Unilat W or Wo Pelvis 2-3 Views Left  Result Date: 12/01/2022 CLINICAL DATA:  Trauma.  Level 2 trauma.  Fall.  Left hip pain. EXAM: DG HIP (WITH OR WITHOUT PELVIS) 2-3V LEFT COMPARISON:  None Available. FINDINGS: Transverse fracture of the left femoral neck with varus angulation of the fracture fragments. No evidence of involvement of the inter trochanteric region. Visualized pelvis appears intact. Visualized sacrum appears intact. SI joints and symphysis pubis are not displaced. IMPRESSION: Transverse fracture left femoral neck with varus angulation of fracture fragments. Electronically Signed   By: Lucienne Capers M.D.   On: 12/01/2022 19:32   CT Lumbar Spine Wo Contrast  Result Date: 12/01/2022 CLINICAL DATA:  [place], trauma EXAM: CT LUMBAR SPINE WITHOUT CONTRAST TECHNIQUE: Multidetector CT imaging of the lumbar spine was performed without intravenous contrast administration. Multiplanar CT image reconstructions were also generated. RADIATION DOSE REDUCTION: This exam was performed according to the departmental dose-optimization program which includes automated exposure control, adjustment of the mA and/or kV according to patient size and/or use of iterative reconstruction technique. COMPARISON:  06/25/2015 FINDINGS: Segmentation: 5 lumbar type vertebrae. Alignment: Mild right convex scoliosis centered at L3. Otherwise alignment is anatomic. Vertebrae: There is mild invagination of the superior endplate of the L1 vertebral body, consistent with  age-indeterminate fracture. Would favor chronic change given the lack  of visible fracture line or associated paraspinal soft tissue swelling. No other acute lumbar spine fractures. Unilateral right pars defect at L5 with no evidence of spondylolisthesis. 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 Paraspinal and other soft tissues: Paraspinal soft tissues are unremarkable. Atherosclerosis within the aorta and its distal branches. Disc levels: Disc space height is relatively well preserved. Mild broad-based disc bulge at L2-3 and L3-4 without significant compressive sequela. Mild broad-based disc bulge at the L5-S1 level results in minimal symmetrical neural foraminal encroachment. IMPRESSION: 1. Slight invagination superior endplate L1 vertebral body, likely chronic. No other signs of acute lumbar spine fracture. 2. Mild spondylosis. 3. 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 Electronically Signed   By: Randa Ngo M.D.   On: 12/01/2022 19:32   DG Chest Port 1 View  Result Date: 12/01/2022 CLINICAL DATA:  Trauma.  Level 2 trauma.  Fall.  Left hip pain. EXAM: PORTABLE CHEST 1 VIEW COMPARISON:  11/25/2021 FINDINGS: Postoperative changes in the mediastinum. Heart size and pulmonary vascularity are normal. Nodular area of scarring suggested in the right mid lung is more prominent than on prior study, possibly indicating a developing pulmonary nodule. CT correlation is suggested. No airspace disease or consolidation otherwise identified. No pleural effusions. No pneumothorax. Mediastinal contours appear intact. Calcified and tortuous aorta. Esophageal hiatal hernia behind the heart. Degenerative changes in the spine and shoulders. Postoperative changes in the cervical spine. IMPRESSION: 1. Developing nodular opacity in the right mid lung. CT suggested for further evaluation. 2. Otherwise, no evidence of active pulmonary disease.  Electronically Signed   By: Lucienne Capers M.D.   On: 12/01/2022 19:32   CT CERVICAL SPINE WO CONTRAST  Result Date: 12/01/2022 CLINICAL DATA:  Poly trauma, blunt.  Level 2 fall on thinners EXAM: CT CERVICAL SPINE WITHOUT CONTRAST TECHNIQUE: Multidetector CT imaging of the cervical spine was performed without intravenous contrast. Multiplanar CT image reconstructions were also generated. RADIATION DOSE REDUCTION: This exam was performed according to the departmental dose-optimization program which includes automated exposure control, adjustment of the mA and/or kV according to patient size and/or use of iterative reconstruction technique. COMPARISON:  01/15/2021 FINDINGS: Alignment: Normal alignment Skull base and vertebrae: No acute fracture. No primary bone lesion or focal pathologic process. Soft tissues and spinal canal: No prevertebral fluid or swelling. No visible canal hematoma. Disc levels: Postoperative changes with anterior plate and screw fixation and intervertebral fusion at C5 through C7. Degenerative changes with disc space narrowing and endplate osteophyte formation most prominent at C4-5 and C7-T1. Degenerative changes in the facet joints. Upper chest: Lung apices are clear. Other: Postoperative changes in the mediastinum with sternotomy wires visible. IMPRESSION: Normal alignment of the cervical spine. No acute displaced fractures identified. Degenerative and postoperative changes as discussed. Electronically Signed   By: Lucienne Capers M.D.   On: 12/01/2022 19:30   CT HEAD WO CONTRAST  Result Date: 12/01/2022 CLINICAL DATA:  Head trauma, moderate to severe. Level 2 fall on thinners. Poly trauma, blunt EXAM: CT HEAD WITHOUT CONTRAST TECHNIQUE: Contiguous axial images were obtained from the base of the skull through the vertex without intravenous contrast. RADIATION DOSE REDUCTION: This exam was performed according to the departmental dose-optimization program which includes automated  exposure control, adjustment of the mA and/or kV according to patient size and/or use of iterative reconstruction technique. COMPARISON:  11/25/2021 FINDINGS: Brain: Diffuse cerebral atrophy. Ventricular dilatation consistent with central atrophy. Low-attenuation changes in the  deep white matter consistent with small vessel ischemia. No abnormal extra-axial fluid collections. No mass effect or midline shift. Gray-white matter junctions are distinct. Basal cisterns are not effaced. No acute intracranial hemorrhage. Old encephalomalacia in the right anterior frontal lobe is unchanged since prior study, likely old infarct. Vascular: Intracranial arterial calcifications. Skull: Normal. Negative for fracture or focal lesion. Sinuses/Orbits: No acute finding. Other: None. IMPRESSION: No acute intracranial abnormalities. Chronic atrophy and small vessel ischemic changes. Electronically Signed   By: Lucienne Capers M.D.   On: 12/01/2022 19:27      Assessment/Plan   Principal Problem:   Closed left hip fracture (HCC) Active Problems:   DM2 (diabetes mellitus, type 2) (HCC)   HLD (hyperlipidemia)   Essential hypertension   GERD (gastroesophageal reflux disease)   Fall at home, initial encounter   Lactic acidosis   Nodule of middle lobe of right lung   Paroxysmal atrial fibrillation (HCC)   Chronic diastolic CHF (congestive heart failure) (HCC)   GAD (generalized anxiety disorder)   BPH (benign prostatic hyperplasia)     #) Acute left femoral neck fracture: confirmed via presenting plain films and stemming from ground level fall without associated loss of consciousness that occurred earlier on the day of admission, as further described above, resulting in immediate develop of acute left hip pain.  At this time, the left lower extremity appears neurovascularly intact, and patient reports adequate pain control. Not on any blood thinners at home, including no aspirin.   The patient's case/imaging were  discussed with the on-call orthopedic surgeon, Dr. Sammuel Hines of [facility],  who recommended admission to the hospitalist service for further evaluation/management of acute left hip fx, including preoperative medical optimization. Dr. Sammuel Hines to formally consult and see the patient in the morning (2/8), requesting that the patient be made n.p.o. at midnight in the interval.  Lyndel Safe Score for this patient in the context of anticipated aforementioned orthopedic surgery conveys a  1.72% perioperative risk for significant cardiac event. No clinical or radiographic evidence to suggest acutely decompensated heart failure or acute MI. Consequently, no absolute contraindications to proceeding with proposed orthopedic surgery at this time.    Plan: Formal orthopedic surgery consult for evaluation for definitive surgical management.  Per orthopedic surgery, patient has been made NPO after MN.  No pharmacologic anticoagulation leading up to this anticipated surgery. SCD's. Prn IV fentanyl. Anticipate postoperative PT consult. Check INR.  Check 25-hydroxy vit D level to assess for any underlying pathological contribution towards the patient's fracture stemming from associated vitamin deficiency. Type and screen ordered.          #) Presyncope resulting in ground-level fall: An episode of dizziness, lightheadedness, and sensation of impending loss of consciousness that occurred when the patient was attempting to rise from seated to standing position earlier today, resulting in presenting ground-level fall without formal loss of consciousness.  This presentation appears suggestive of orthostatic hypotension, of which the patient has a documented history thereof, and for which she is on scheduled midodrine at home.  Risk for recurrent orthostatic hypotension appears to be enhanced the vaso dilatory effects of an hypertensive regimen that includes amlodipine.  Presentation appears less suggestive of ACS, in the absence  of any recent or associated chest pain, while presenting EKG shows no evidence of acute ischemic changes, including no evidence of ST elevation.  Additionally, in the absence of associated formal loss of consciousness, ventricular arrhythmia also appears less likely, but will check in closely trend magnesium level.  Unable to confirm suspected orthostatic hypotension given presenting acute left hip fracture and associated inability to safely pursue orthostatic vital sign evaluation at this time.  Will provide a dose of his home scheduled midodrine at this time, closely monitor and standing vital signs as indicated below, while pursuing additional fall precautions as outlined below. Will also check UA to evaluate for any contributory underlying infectious process.   As noted above, while the patient did hit his head is a component of the above fall, he is no longer on any blood thinners, and CT head showed no evidence of acute process, including no evidence of intracranial hemorrhage.   In terms of potential additional contributory factors in the context of his documented history of recurrent orthostatic hypotension, of which autonomic dysfunction in the setting of a documented history of type 2 diabetes mellitus is a possibility, will also check hemoglobin A1c level.   Plan: Check urinalysis, as above.  Repeat CMP and CBC with differential in the morning. Fall precautions. Anticipate postoperative PT consult.  Monitor routine vital signs.  Monitor strict I's and O's Daily weights.  Hold amlodipine for now.  Monitor on telemetry.  Check hemoglobin A1c level.            #) Lactic acidosis: Mildly elevated presenting lactate 2.1.  Very mild elevation thereafter, potentially with contribution from mild dehydration, for which the patient has received interval 1 L normal saline bolus.  Early rhabdomyolysis is in the differential, but felt to be less likely given reported very limited amount of time  spent on the floor following presenting ground-level fall.  Will follow for result urinalysis, with potential for adding on CPK level if suggestive of myoglobinuria.  Presenting INR result currently pending.  Additionally, in the absence of objective fever or leukocytosis, SIRS criteria not currently met.  Additionally, no evidence of underlying infectious source at this time.  Overall, criteria not currently met for sepsis.  Serum ethanol level is also currently pending.  Plan: Monitor strict I's and O's and daily weights.  Repeat lactic acid level in the morning.  Follow-up result urinalysis, INR, serum ethanol level.  Repeat CMP/CBC in the morning.  Follow-up result urinalysis.            #) Nodular opacity in the right midlung: Presenting chest x-ray shows slight interval increase in prominence of nodular area of scarring in the right midlung relative to plan films of the chest performed in February 2023, which, per radiology, possibly indicates a developing pulmonary nodule.  Radiology recommends CT chest for further correlation, which it appears can be pursued on a nonemergent basis as an outpatient.   Plan: Recommend nonemergent outpatient pursuit of CT chest for further correlation, per radiology recommendation, as above.              #) Paroxysmal atrial fibrillation: Documented history of such, with documentation of atrial fibrillation/SVT around 2012. In setting of CHA2DS2-VASc score of  7, there is an indication for chronic anticoagulation for thromboembolic prophylaxis.  While the patient was previously undergoing chronic anticoagulation on Xarelto, this was subsequently discontinued given report of risks versus benefits discussions via outpatient providers in the context of the patient's recurrent orthostatic hypotension and enhanced associated fall risk.  Consequently, the patient is not currently on any blood thinners as an outpatient.  Home AV nodal blocking regimen:  None.  Most recent echocardiogram occurred in November 2020, with results notable for LVEF 50 to 52% grade 1 diastolic dysfunction, trace mitral  regurgitation, trivial tricuspid regurgitation and mild aortic regurgitation with additional details as conveyed above. Presenting EKG is suggestive of rate controlled atrial fibrillation without overt evidence of acute ischemic changes..    Plan: monitor strict I's & O's and daily weights. CMP/CBC in AM. Check serum mag level.  Monitor on telemetry.            #) Chronic diastolic heart failure: documented history of such, with most recent echocardiogram performed in November 2020, which was notable for grade 1 diastolic dysfunction, and additional details as conveyed above. No clinical or evidence to suggest acutely decompensated heart failure at this time. home diuretic regimen reportedly consists of the following: None.    Plan: monitor strict I's & O's and daily weights. Repeat CMP in AM. Check serum mag level.              #) Generalized anxiety disorder: documented h/o such. On scheduled Lexapro as well as daily scheduled Xanax as outpatient.  In the context of plan for n.p.o. at midnight in the setting of presenting acute left hip fracture, will hold these home scheduled medications, but ordered as needed IV Ativan for anxiety.  Plan: Hold home scheduled Lexapro and Xanax for now, as above.  As needed IV Ativan for anxiety.              #) type 2 diabetes mellitus: Documented history of such, which is now managed via lifestyle modifications, in the absence of any insulin or oral hypoglycemic agents.  Most recent hemoglobin A1c was noted to be 5.3% in June 2021.  Presenting blood sugar noted to be 96.  In the context of a history of recurrent orthostatic hypotension/presyncope, will check an updated hemoglobin A1c level to evaluate for any contribution from latent hypoglycemia.  Plan: Check hemoglobin A1c level, as  above.             #) GERD: documented h/o such; on daily oral Protonix as outpatient.   Plan: In the setting of current n.p.o. status, will transiently transition his home oral Protonix to daily IV Protonix, as above.              #) Hyperlipidemia: documented h/o such. On high intensity atorvastatin as outpatient.   Plan: Hold home statin in the setting of n.p.o. status, as above.[name]                 #) Essential Hypertension: documented h/o such, with outpatient antihypertensive regimen including amlodipine.  SBP's in the ED today: Low 100s mmHg, subsequently increasing into the 140s following interval administration of 1 L normal saline, as above.  Suspect contribution from orthostatic hypotension contributing to his presenting presyncopal episode.  However, unable to assess orthostatic vital signs at this time in the context of resultant acute left hip fracture.  Plan: Close monitoring of subsequent BP via routine VS. hold home amlodipine for now, as above.  Monitor strict I's and O's and daily weights.             #) Benign Prostatic Hyperplasia:  documented h/o such; on tamsulosin as well as finasteride as outpatient.   Plan: monitor strict I's & O's and daily weights. Repeat CMP in AM.  In the setting of n.p.o. status, will hold home tamsulosin as well as finasteride for now.        DVT prophylaxis: SCD's   Code Status: Full code Family Communication:  I discussed the patient's case with his son-in-law, who is present at  bedside Disposition Plan: Per Rounding Team Consults called: ED discussed patient's case with the on-call orthopedic surgeon, Dr.Bokshan Of [facility], who will formally consult and evaluate the patient in the morning (2/8), as further detailed above;  Admission status: Inpatient     I SPENT GREATER THAN 75  MINUTES IN CLINICAL CARE TIME/MEDICAL DECISION-MAKING IN COMPLETING THIS ADMISSION.      [place] DO [facility]  From [place]   12/01/2022, 8:45 PM

## 2022-12-01 NOTE — ED Notes (Signed)
ED TO INPATIENT HANDOFF REPORT  ED Nurse Name and Phone #: [phone]  S Name/Age/Gender John [name] 88 y.o. male Room/Bed: 028C/028C  Code Status   Code Status: Full Code  Home/SNF/Other Nursing Home Patient oriented to: self, place, time, and situation Is this baseline? Yes   Triage Complete: Triage complete  Chief Complaint Closed left hip fracture ([place]) [S72.002A]  Triage Note PT BIBGEMS for an unwitnessed fall. Pt got up out of bed for an alarm. Pt usually uses a walker, but got up without one. Pt had a mechanical fall and hit head. Laceration on the occiput approximately 3-4 inches. Denies LOC. Pt c/o left hip pain. GCS 14   Allergies Allergies  Allergen Reactions   Gabapentin Other (See Comments)    Severe confusion   Prozac [Fluoxetine Hcl] Other (See Comments)    Patient state that it does not agree with him   Soma [Carisoprodol] Other (See Comments)    Patient stated that it does not agree with him   Beta Adrenergic Blockers Other (See Comments)    REACTION: Bradycardia    Level of Care/Admitting Diagnosis ED Disposition     ED Disposition  Admit   Condition  --   [place]: [place] [[id number]]  Level of Care: Telemetry Medical [104]  May admit patient to [facility] or [facility] if equivalent level of care is available:: No  Covid Evaluation: Asymptomatic - no recent exposure (last 10 days) testing not required  Diagnosis: Closed left hip fracture [facility]) [[id number]]  Admitting Physician: Rhetta Mura [[id number]]  Attending Physician: Rhetta Mura [[id number]]  Certification:: I certify this patient will need inpatient services for at least 2 midnights  Estimated Length of Stay: 2          B Medical/Surgery History Past Medical History:  Diagnosis Date   Anxiety    Arthritis    "lower back; left ankle" (03/20/2014)   ASTHMA    Blood transfusion    "related to OHS, ulcers"   CAD (coronary artery  disease)    a. s/p 2V CABG 1996. b. Last cath 12/2016 -> patent grafts, med rx.   CAD, ARTERY BYPASS GRAFT    Cataract    CKD (chronic kidney disease), stage II    Colon polyps    adenomatous polyps   COPD    Depression    Diabetes mellitus without complication ([place])    no meds now   Diverticulosis    Duodenal ulcer without hemorrhage or perforation 12/29/2011   GASTROESOPHAGEAL REFLUX DISEASE    HYPERLIPIDEMIA    Insomnia    Leukodystrophy    Memory loss    Nephrolithiasis    "I've had over 320 kidney stones" (03/20/2014)   NEPHROLITHIASIS, HX OF 03/12/2009   Parkinsonism    Sleep apnea    does not use CPAP   Stroke ([place])    TIA   SUPRAVENTRICULAR TACHYCARDIA, HX OF    SVT (supraventricular tachycardia) 03/12/2009   TIA    Ulcer    Past Surgical History:  Procedure Laterality Date   ANKLE FRACTURE SURGERY Left ~ 2012   CARDIAC CATHETERIZATION     COLONOSCOPY     CORONARY ARTERY BYPASS GRAFT  1996   CABG X2   CYSTOSCOPY WITH URETEROSCOPY, STONE BASKETRY AND STENT PLACEMENT     Cystourethroscopy with stent removal.     EXCISIONAL HEMORRHOIDECTOMY     GASTRECTOMY     HIATAL HERNIA REPAIR  IRRIGATION AND DEBRIDEMENT SEBACEOUS CYST     "off my back   KNEE ARTHROSCOPY WITH MEDIAL MENISECTOMY Left 08/24/2019   Procedure: LEFT KNEE ARTHROSCOPY WITH PARTIAL MEDIAL MENISCECTOMY;  Surgeon: Leandrew Koyanagi, MD;  Location: [facility];  Service: Orthopedics;  Laterality: Left;   LEFT HEART CATH AND CORS/GRAFTS ANGIOGRAPHY N/A 12/23/2016   Procedure: Left Heart Cath and Cors/Grafts Angiography;  Surgeon: Burnell Blanks, MD;  Location: [place] [facility];  Service: Cardiovascular;  Laterality: N/A;   POLYPECTOMY     PROSTATE SURGERY     "took the center of my prostate out"   UPPER GASTROINTESTINAL ENDOSCOPY     VAGOTOMY       A IV Location/Drains/Wounds Patient Lines/Drains/Airways Status     Active Line/Drains/Airways     Name Placement date Placement  time Site Days   Peripheral IV 12/01/22 20 G Right Antecubital 12/01/22  1758  Antecubital  less than 1   Incision (Closed) 08/24/19 Leg 08/24/19  1317  -- 1195   Incision (Closed) 08/24/19 Knee Left 08/24/19  1317  -- 1195            Intake/Output Last 24 hours No intake or output data in the 24 hours ending 12/01/22 2155  Labs/Imaging Results for orders placed or performed during the hospital encounter of 12/01/22 (from the past 48 hour(s))  Comprehensive metabolic panel     Status: Abnormal   Collection Time: 12/01/22  5:57 PM  Result Value Ref Range   Sodium 141 135 - 145 mmol/L   Potassium 3.9 3.5 - 5.1 mmol/L   Chloride 104 98 - 111 mmol/L   CO2 22 22 - 32 mmol/L   Glucose, Bld 96 70 - 99 mg/dL    Comment: Glucose reference range applies only to samples taken after fasting for at least 8 hours.   BUN 19 8 - 23 mg/dL   Creatinine, Ser 1.43 (H) 0.61 - 1.24 mg/dL   Calcium 9.1 8.9 - 10.3 mg/dL   Total Protein 6.7 6.5 - 8.1 g/dL   Albumin 3.9 3.5 - 5.0 g/dL   AST 18 15 - 41 U/L   ALT 12 0 - 44 U/L   Alkaline Phosphatase 63 38 - 126 U/L   Total Bilirubin 0.6 0.3 - 1.2 mg/dL   GFR, Estimated 48 (L) >60 mL/min    Comment: (NOTE) Calculated using the CKD-EPI Creatinine Equation (2021)    Anion gap 15 5 - 15    Comment: Performed at [facility] [address], [place], [place] [address]  CBC     Status: None   Collection Time: 12/01/22  5:57 PM  Result Value Ref Range   WBC 10.3 4.0 - 10.5 K/uL   RBC 4.27 4.22 - 5.81 MIL/uL   Hemoglobin 14.3 13.0 - 17.0 g/dL   HCT 41.6 39.0 - 52.0 %   MCV 97.4 80.0 - 100.0 fL   MCH 33.5 26.0 - 34.0 pg   MCHC 34.4 30.0 - 36.0 g/dL   RDW 13.4 11.5 - 15.5 %   Platelets 202 150 - 400 K/uL   nRBC 0.0 0.0 - 0.2 %    Comment: Performed at [facility], [place] [address] [place], [place] [address]  Lactic acid, plasma     Status: Abnormal   Collection Time: 12/01/22  5:57 PM  Result Value Ref Range   Lactic Acid, Venous 2.1  (HH) 0.5 - 1.9 mmol/L    Comment: CRITICAL RESULT  CALLED TO, READ BACK BY AND VERIFIED WITH e anello,rn 1859 12/01/2022 [facility] Performed at [facility], [place] [address] [place], [place] [address]   I-Stat Chem 8, ED     Status: Abnormal   Collection Time: 12/01/22  6:07 PM  Result Value Ref Range   Sodium 142 135 - 145 mmol/L   Potassium 3.9 3.5 - 5.1 mmol/L   Chloride 106 98 - 111 mmol/L   BUN 20 8 - 23 mg/dL   Creatinine, Ser 1.40 (H) 0.61 - 1.24 mg/dL   Glucose, Bld 93 70 - 99 mg/dL    Comment: Glucose reference range applies only to samples taken after fasting for at least 8 hours.   Calcium, Ion 1.19 1.15 - 1.40 mmol/L   TCO2 24 22 - 32 mmol/L   Hemoglobin 14.3 13.0 - 17.0 g/dL   HCT 42.0 39.0 - 52.0 %   CT THORACIC SPINE WO CONTRAST  Result Date: 12/01/2022 CLINICAL DATA:  Fall.  Back pain. EXAM: CT THORACIC SPINE WITHOUT CONTRAST TECHNIQUE: Multidetector CT images of the thoracic were obtained using the standard protocol without intravenous contrast. RADIATION DOSE REDUCTION: This exam was performed according to the departmental dose-optimization program which includes automated exposure control, adjustment of the mA and/or kV according to patient size and/or use of iterative reconstruction technique. COMPARISON:  Chest radiograph dated 12/01/2022 and 11/25/2021. FINDINGS: Evaluation of this exam is limited due to motion artifact. Alignment: No acute subluxation. Vertebrae: No acute fracture. Paraspinal and other soft tissues: No acute finding. No perispinal fluid collection or hematoma. There are areas of scarring in the lungs bilaterally. Evaluation of the lungs is limited due to respiratory motion. There is a moderate size hiatal hernia. There is coronary vascular calcification. Disc levels: No acute findings.  Degenerative changes. IMPRESSION: No acute/traumatic thoracic spine pathology. Electronically Signed   By: Anner Crete M.D.   On: 12/01/2022 19:34   DG Knee Left  Port  Result Date: 12/01/2022 CLINICAL DATA:  Left hip fracture, left thigh pain, level 2 trauma, fall, left hip pain EXAM: PORTABLE LEFT KNEE - 1-2 VIEW COMPARISON:  None 2520 FINDINGS: Degenerative changes in the left knee with medial compartment narrowing and small osteophyte formation. No evidence of acute fracture or dislocation. No focal bone lesion or bone destruction. No significant effusions. Vascular calcifications in the soft tissues. Degenerative changes have progressed since prior study. IMPRESSION: Mild progressing degenerative changes in the left knee. No acute displaced fractures are identified. Electronically Signed   By: Lucienne Capers M.D.   On: 12/01/2022 19:34   DG Hip Unilat W or Wo Pelvis 2-3 Views Left  Result Date: 12/01/2022 CLINICAL DATA:  Trauma.  Level 2 trauma.  Fall.  Left hip pain. EXAM: DG HIP (WITH OR WITHOUT PELVIS) 2-3V LEFT COMPARISON:  None Available. FINDINGS: Transverse fracture of the left femoral neck with varus angulation of the fracture fragments. No evidence of involvement of the inter trochanteric region. Visualized pelvis appears intact. Visualized sacrum appears intact. SI joints and symphysis pubis are not displaced. IMPRESSION: Transverse fracture left femoral neck with varus angulation of fracture fragments. Electronically Signed   By: Lucienne Capers M.D.   On: 12/01/2022 19:32   CT Lumbar Spine Wo Contrast  Result Date: 12/01/2022 CLINICAL DATA:  [place], trauma EXAM: CT LUMBAR SPINE WITHOUT CONTRAST TECHNIQUE: Multidetector CT imaging of the lumbar spine was performed without intravenous contrast administration. Multiplanar CT image reconstructions were also generated. RADIATION DOSE REDUCTION: This exam was performed according  to the departmental dose-optimization program which includes automated exposure control, adjustment of the mA and/or kV according to patient size and/or use of iterative reconstruction technique. COMPARISON:  06/25/2015 FINDINGS:  Segmentation: 5 lumbar type vertebrae. Alignment: Mild right convex scoliosis centered at L3. Otherwise alignment is anatomic. Vertebrae: There is mild invagination of the superior endplate of the L1 vertebral body, consistent with age-indeterminate fracture. Would favor chronic change given the lack of visible fracture line or associated paraspinal soft tissue swelling. No other acute lumbar spine fractures. Unilateral right pars defect at L5 with no evidence of spondylolisthesis. 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 Paraspinal and other soft tissues: Paraspinal soft tissues are unremarkable. Atherosclerosis within the aorta and its distal branches. Disc levels: Disc space height is relatively well preserved. Mild broad-based disc bulge at L2-3 and L3-4 without significant compressive sequela. Mild broad-based disc bulge at the L5-S1 level results in minimal symmetrical neural foraminal encroachment. IMPRESSION: 1. Slight invagination superior endplate L1 vertebral body, likely chronic. No other signs of acute lumbar spine fracture. 2. Mild spondylosis. 3. 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 Electronically Signed   By: Randa Ngo M.D.   On: 12/01/2022 19:32   DG Chest Port 1 View  Result Date: 12/01/2022 CLINICAL DATA:  Trauma.  Level 2 trauma.  Fall.  Left hip pain. EXAM: PORTABLE CHEST 1 VIEW COMPARISON:  11/25/2021 FINDINGS: Postoperative changes in the mediastinum. Heart size and pulmonary vascularity are normal. Nodular area of scarring suggested in the right mid lung is more prominent than on prior study, possibly indicating a developing pulmonary nodule. CT correlation is suggested. No airspace disease or consolidation otherwise identified. No pleural effusions. No pneumothorax. Mediastinal contours appear intact. Calcified and tortuous aorta. Esophageal hiatal hernia behind the heart. Degenerative changes in the  spine and shoulders. Postoperative changes in the cervical spine. IMPRESSION: 1. Developing nodular opacity in the right mid lung. CT suggested for further evaluation. 2. Otherwise, no evidence of active pulmonary disease. Electronically Signed   By: Lucienne Capers M.D.   On: 12/01/2022 19:32   CT CERVICAL SPINE WO CONTRAST  Result Date: 12/01/2022 CLINICAL DATA:  Poly trauma, blunt.  Level 2 fall on thinners EXAM: CT CERVICAL SPINE WITHOUT CONTRAST TECHNIQUE: Multidetector CT imaging of the cervical spine was performed without intravenous contrast. Multiplanar CT image reconstructions were also generated. RADIATION DOSE REDUCTION: This exam was performed according to the departmental dose-optimization program which includes automated exposure control, adjustment of the mA and/or kV according to patient size and/or use of iterative reconstruction technique. COMPARISON:  01/15/2021 FINDINGS: Alignment: Normal alignment Skull base and vertebrae: No acute fracture. No primary bone lesion or focal pathologic process. Soft tissues and spinal canal: No prevertebral fluid or swelling. No visible canal hematoma. Disc levels: Postoperative changes with anterior plate and screw fixation and intervertebral fusion at C5 through C7. Degenerative changes with disc space narrowing and endplate osteophyte formation most prominent at C4-5 and C7-T1. Degenerative changes in the facet joints. Upper chest: Lung apices are clear. Other: Postoperative changes in the mediastinum with sternotomy wires visible. IMPRESSION: Normal alignment of the cervical spine. No acute displaced fractures identified. Degenerative and postoperative changes as discussed. Electronically Signed   By: Lucienne Capers M.D.   On: 12/01/2022 19:30   CT HEAD WO CONTRAST  Result Date: 12/01/2022 CLINICAL DATA:  Head trauma, moderate to severe. Level 2 fall on thinners. Poly trauma, blunt EXAM: CT HEAD  WITHOUT CONTRAST TECHNIQUE: Contiguous axial images  were obtained from the base of the skull through the vertex without intravenous contrast. RADIATION DOSE REDUCTION: This exam was performed according to the departmental dose-optimization program which includes automated exposure control, adjustment of the mA and/or kV according to patient size and/or use of iterative reconstruction technique. COMPARISON:  11/25/2021 FINDINGS: Brain: Diffuse cerebral atrophy. Ventricular dilatation consistent with central atrophy. Low-attenuation changes in the deep white matter consistent with small vessel ischemia. No abnormal extra-axial fluid collections. No mass effect or midline shift. Gray-white matter junctions are distinct. Basal cisterns are not effaced. No acute intracranial hemorrhage. Old encephalomalacia in the right anterior frontal lobe is unchanged since prior study, likely old infarct. Vascular: Intracranial arterial calcifications. Skull: Normal. Negative for fracture or focal lesion. Sinuses/Orbits: No acute finding. Other: None. IMPRESSION: No acute intracranial abnormalities. Chronic atrophy and small vessel ischemic changes. Electronically Signed   By: Lucienne Capers M.D.   On: 12/01/2022 19:27    Pending Labs Unresulted Labs (From admission, onward)     Start     Ordered   12/02/22 0500  CBC with Differential/Platelet  Tomorrow morning,   R        12/01/22 2044   12/02/22 0500  Comprehensive metabolic panel  Tomorrow morning,   R        12/01/22 2044   12/02/22 0500  Magnesium  Tomorrow morning,   R        12/01/22 2044   12/01/22 2045  Magnesium  Add-on,   AD        12/01/22 2044   12/01/22 1900  Protime-INR  Once,   R        12/01/22 1900   12/01/22 1740  Ethanol  (Trauma Panel)  Once,   URGENT        12/01/22 1741   12/01/22 1740  Urinalysis, Routine w reflex microscopic -Urine, Random  (Trauma Panel)  Once,   URGENT       Question:  Specimen Source  Answer:  Urine, Random   12/01/22 1741   12/01/22 1740  Sample to Blood Bank   (Trauma Panel)  Once,   URGENT        12/01/22 1741            Vitals/Pain Today's Vitals   12/01/22 1845 12/01/22 1939 12/01/22 2005 12/01/22 2049  BP: (!) 145/80  (!) 146/77   Pulse: 98  95   Resp: 13  20   Temp:      TempSrc:      SpO2: (!) 78%  99%   Weight:  83 kg    Height:  6' (1.829 m)    PainSc:   10-Worst pain ever 8     Isolation Precautions No active isolations  Medications Medications  acetaminophen (TYLENOL) tablet 650 mg (has no administration in time range)    Or  acetaminophen (TYLENOL) suppository 650 mg (has no administration in time range)  melatonin tablet 3 mg (has no administration in time range)  ondansetron (ZOFRAN) injection 4 mg (has no administration in time range)  naloxone (NARCAN) injection 0.4 mg (has no administration in time range)  fentaNYL (SUBLIMAZE) injection 50 mcg (has no administration in time range)  morphine (PF) 4 MG/ML injection 4 mg (4 mg Intravenous Given 12/01/22 1800)  HYDROmorphone (DILAUDID) injection 0.5 mg (0.5 mg Intravenous Given 12/01/22 2002)  sodium chloride 0.9 % bolus 1,000 mL (1,000 mLs Intravenous New Bag/Given 12/01/22 2003)  lidocaine-EPINEPHrine-tetracaine (LET) topical  gel (3 mLs Topical Given 12/01/22 2046)  lidocaine-EPINEPHrine (XYLOCAINE W/EPI) 2 %-1:200000 (PF) injection 20 mL (20 mLs Infiltration Given 12/01/22 2126)  HYDROmorphone (DILAUDID) injection 2 mg (2 mg Intravenous Given 12/01/22 2136)    Mobility walks with device     Focused Assessments      R Recommendations: See Admitting Provider Note  Report given to:   Additional Notes: laceration repair to left back of head, uses urinal

## 2022-12-01 NOTE — ED Provider Notes (Signed)
[facility] Provider Note   CSN: [id number] Arrival date & time: 12/01/22  1739     History  Chief Complaint  Patient presents with   John [name] is a 88 y.o. male.  Patient presents to the hospital via EMS complaining of pain secondary to fall.  Patient complains of left hip pain and posterior scalp laceration.  Patient reportedly slipped and fell, falling backwards and hitting his head on the floor.  He denies loss of consciousness.  The patient endorses blood thinner usage but no blood thinners are noted on patient's chart review.  Patient is alert and oriented to person, place, and event at this time.  Patient with past medical history significant for TIA, intracranial hemorrhage, SVT, coronary artery disease with bypass graft, leukodystrophy, asthma, anxiety, parkinsonism, stroke, type II DM, CKD stage III/A1, dementia  HPI     Home Medications Prior to Admission medications   Medication Sig Start Date End Date Taking? Authorizing Provider  acetaminophen (PAIN RELIEF EXTRA STRENGTH) 500 MG tablet Take 2 tablets (1,000 mg total) by mouth every 6 (six) hours as needed (for pain). 12/03/21   Rai, Vernelle Emerald, MD  ALPRAZolam (XANAX XR) 0.5 MG 24 hr tablet Take 1 tablet  Daily at [facility] Chronic Anxiety 05/20/22   Unk Pinto, MD  amLODipine (NORVASC) 5 MG tablet Take 1 tablet (5 mg total) by mouth daily. 12/03/21   Rai, Vernelle Emerald, MD  atorvastatin (LIPITOR) 40 MG tablet Take  1 tablet  Daily  for Cholesterol 12/17/21   Unk Pinto, MD  Cholecalciferol 50 MCG (2000 UT) TABS Take 1  capsule  Daily Patient taking differently: Take 2,000 Units by mouth daily. 10/12/21   Unk Pinto, MD  escitalopram (LEXAPRO) 10 MG tablet Take 1 tablet Daily                               TAKE   BY                                MOUTH 05/20/22   Unk Pinto, MD  finasteride (PROSCAR) 5 MG tablet Take  1 tablet  Daily  for  Prostate Patient taking differently: Take 5 mg by mouth daily. for Prostate 10/12/21   Unk Pinto, MD  glucose blood (FREESTYLE LITE) test strip TEST BLOOD SUGAR ONCE DAILY 06/01/19   Unk Pinto, MD  Lancets (FREESTYLE) lancets 1 each by Other route as needed for other. Use as instructed    [provider]  loperamide (ANTI-DIARRHEAL) 2 MG tablet Take  1 tablet  2 x /day 05/20/22   Unk Pinto, MD  loperamide (IMODIUM) 2 MG capsule Take 1 tablet qod - hs  - even days Patient taking differently: Take 2 mg by mouth 2 (two) times daily. 01/30/21   Unk Pinto, MD  midodrine (PROAMATINE) 5 MG tablet Take 1 tablet 2 x /daily with Meals for Low BP 12/17/21   Unk Pinto, MD  nitroGLYCERIN (NITROSTAT) 0.4 MG SL tablet Sig: 1 tablet under tongue every 3 to 5 minutes as needed for Angina - Please Dispense # 2 bottles of # 25 tabs Patient not taking: Reported on 11/26/2021 07/17/18   Unk Pinto, MD  pantoprazole (PROTONIX) 40 MG tablet Take  1 tablet  Daily  to Prevent Heartburn & Indigestion Patient taking  differently: Take 40 mg by mouth daily. to Prevent Heartburn & Indigestion 10/12/21   Unk Pinto, MD  polyethylene glycol (MIRALAX / GLYCOLAX) 17 g packet Take 17 g by mouth daily as needed for moderate constipation. 01/19/21   Hosie Poisson, MD  QUEtiapine (SEROQUEL) 25 MG tablet Take  1 tablet 3 x /day before Meals                                                       /                                          TAKE                                        BY                                              MOUTH 08/03/22   Unk Pinto, MD  QUEtiapine (SEROQUEL) 50 MG tablet Take 1  tablet  at Bedtime 05/20/22   Unk Pinto, MD  tamsulosin (FLOMAX) 0.4 MG CAPS capsule Take 1 capsule Daily for Prosate Patient taking differently: Take 0.4 mg by mouth daily. for Prosate 09/09/21   Unk Pinto, MD  traMADol (ULTRAM) 50 MG tablet Take 2 tablets (100 mg total) by  mouth every 8 (eight) hours as needed for severe pain or moderate pain (headache/migraine). 12/03/21   Rai, Vernelle Emerald, MD  vitamin C (ASCORBIC ACID) 500 MG tablet Take 1 tablet Daily  Patient taking differently: Take 500 mg by mouth daily. 10/14/21   Unk Pinto, MD      Allergies    Gabapentin, Prozac [fluoxetine hcl], Soma [carisoprodol], and Beta adrenergic blockers    Review of Systems   Review of Systems  Respiratory:  Negative for shortness of breath.   Musculoskeletal:  Positive for arthralgias (Left hip pain).  Neurological:  Positive for headaches (Secondary to fall). Negative for syncope and weakness.    Physical Exam Updated Vital Signs BP (!) 146/77   Pulse 95   Temp 98.7 F (37.1 C) (Oral)   Resp 20   Ht 6' (1.829 m)   Wt 83 kg   SpO2 99%   BMI 24.82 kg/m  Physical Exam Vitals and nursing note reviewed.  Constitutional:      General: He is not in acute distress.    Appearance: He is well-developed.  HENT:     Head: Normocephalic and atraumatic.  Eyes:     Extraocular Movements: Extraocular movements intact.     Conjunctiva/sclera: Conjunctivae normal.     Pupils: Pupils are equal, round, and reactive to light.  Cardiovascular:     Rate and Rhythm: Normal rate and regular rhythm.  Pulmonary:     Effort: Pulmonary effort is normal. No respiratory distress.     Breath sounds: Normal breath sounds.  Abdominal:     Palpations: Abdomen is soft.     Tenderness: There is no abdominal tenderness.  Musculoskeletal:  General: Tenderness and deformity present.     Cervical back: Neck supple.     Comments: Left hip/groin tenderness.  Rotation with mild shortening noted  Skin:    General: Skin is warm and dry.     Capillary Refill: Capillary refill takes less than 2 seconds.     Comments: Laceration to posterior scalp  Neurological:     General: No focal deficit present.     Mental Status: He is alert.  Psychiatric:        Mood and Affect: Mood  normal.     ED Results / Procedures / Treatments   Labs (all labs ordered are listed, but only abnormal results are displayed) Labs Reviewed  COMPREHENSIVE METABOLIC PANEL - Abnormal; Notable for the following components:      Result Value   Creatinine, Ser 1.43 (*)    GFR, Estimated 48 (*)    All other components within normal limits  LACTIC ACID, PLASMA - Abnormal; Notable for the following components:   Lactic Acid, Venous 2.1 (*)    All other components within normal limits  I-STAT CHEM 8, ED - Abnormal; Notable for the following components:   Creatinine, Ser 1.40 (*)    All other components within normal limits  CBC  ETHANOL  URINALYSIS, ROUTINE W REFLEX MICROSCOPIC  PROTIME-INR  CBC WITH DIFFERENTIAL/PLATELET  COMPREHENSIVE METABOLIC PANEL  MAGNESIUM  MAGNESIUM  SAMPLE TO BLOOD BANK    EKG EKG Interpretation  Date/Time:  Wednesday December 01 2022 18:48:05 EST Ventricular Rate:  106 PR Interval:  210 QRS Duration: 90 QT Interval:  309 QTC Calculation: 411 R Axis:   38 Text Interpretation: Artifact limits evaluation, likely atrial fibrillation Borderline prolonged PR interval Probable LVH with secondary repol abnrm Confirmed by Gareth Morgan [phone]) on 12/01/2022 7:45:32 PM  Radiology CT THORACIC SPINE WO CONTRAST  Result Date: 12/01/2022 CLINICAL DATA:  Fall.  Back pain. EXAM: CT THORACIC SPINE WITHOUT CONTRAST TECHNIQUE: Multidetector CT images of the thoracic were obtained using the standard protocol without intravenous contrast. RADIATION DOSE REDUCTION: This exam was performed according to the departmental dose-optimization program which includes automated exposure control, adjustment of the mA and/or kV according to patient size and/or use of iterative reconstruction technique. COMPARISON:  Chest radiograph dated 12/01/2022 and 11/25/2021. FINDINGS: Evaluation of this exam is limited due to motion artifact. Alignment: No acute subluxation. Vertebrae: No acute  fracture. Paraspinal and other soft tissues: No acute finding. No perispinal fluid collection or hematoma. There are areas of scarring in the lungs bilaterally. Evaluation of the lungs is limited due to respiratory motion. There is a moderate size hiatal hernia. There is coronary vascular calcification. Disc levels: No acute findings.  Degenerative changes. IMPRESSION: No acute/traumatic thoracic spine pathology. Electronically Signed   By: Anner Crete M.D.   On: 12/01/2022 19:34   DG Knee Left Port  Result Date: 12/01/2022 CLINICAL DATA:  Left hip fracture, left thigh pain, level 2 trauma, fall, left hip pain EXAM: PORTABLE LEFT KNEE - 1-2 VIEW COMPARISON:  None 2520 FINDINGS: Degenerative changes in the left knee with medial compartment narrowing and small osteophyte formation. No evidence of acute fracture or dislocation. No focal bone lesion or bone destruction. No significant effusions. Vascular calcifications in the soft tissues. Degenerative changes have progressed since prior study. IMPRESSION: Mild progressing degenerative changes in the left knee. No acute displaced fractures are identified. Electronically Signed   By: Lucienne Capers M.D.   On: 12/01/2022 19:34   DG Hip Unilat  W or Wo Pelvis 2-3 Views Left  Result Date: 12/01/2022 CLINICAL DATA:  Trauma.  Level 2 trauma.  Fall.  Left hip pain. EXAM: DG HIP (WITH OR WITHOUT PELVIS) 2-3V LEFT COMPARISON:  None Available. FINDINGS: Transverse fracture of the left femoral neck with varus angulation of the fracture fragments. No evidence of involvement of the inter trochanteric region. Visualized pelvis appears intact. Visualized sacrum appears intact. SI joints and symphysis pubis are not displaced. IMPRESSION: Transverse fracture left femoral neck with varus angulation of fracture fragments. Electronically Signed   By: Lucienne Capers M.D.   On: 12/01/2022 19:32   CT Lumbar Spine Wo Contrast  Result Date: 12/01/2022 CLINICAL DATA:  [place],  trauma EXAM: CT LUMBAR SPINE WITHOUT CONTRAST TECHNIQUE: Multidetector CT imaging of the lumbar spine was performed without intravenous contrast administration. Multiplanar CT image reconstructions were also generated. RADIATION DOSE REDUCTION: This exam was performed according to the departmental dose-optimization program which includes automated exposure control, adjustment of the mA and/or kV according to patient size and/or use of iterative reconstruction technique. COMPARISON:  06/25/2015 FINDINGS: Segmentation: 5 lumbar type vertebrae. Alignment: Mild right convex scoliosis centered at L3. Otherwise alignment is anatomic. Vertebrae: There is mild invagination of the superior endplate of the L1 vertebral body, consistent with age-indeterminate fracture. Would favor chronic change given the lack of visible fracture line or associated paraspinal soft tissue swelling. No other acute lumbar spine fractures. Unilateral right pars defect at L5 with no evidence of spondylolisthesis. 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 Paraspinal and other soft tissues: Paraspinal soft tissues are unremarkable. Atherosclerosis within the aorta and its distal branches. Disc levels: Disc space height is relatively well preserved. Mild broad-based disc bulge at L2-3 and L3-4 without significant compressive sequela. Mild broad-based disc bulge at the L5-S1 level results in minimal symmetrical neural foraminal encroachment. IMPRESSION: 1. Slight invagination superior endplate L1 vertebral body, likely chronic. No other signs of acute lumbar spine fracture. 2. Mild spondylosis. 3. 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 Electronically Signed   By: Randa Ngo M.D.   On: 12/01/2022 19:32   DG Chest Port 1 View  Result Date: 12/01/2022 CLINICAL DATA:  Trauma.  Level 2 trauma.  Fall.  Left hip pain. EXAM: PORTABLE CHEST 1 VIEW COMPARISON:  11/25/2021  FINDINGS: Postoperative changes in the mediastinum. Heart size and pulmonary vascularity are normal. Nodular area of scarring suggested in the right mid lung is more prominent than on prior study, possibly indicating a developing pulmonary nodule. CT correlation is suggested. No airspace disease or consolidation otherwise identified. No pleural effusions. No pneumothorax. Mediastinal contours appear intact. Calcified and tortuous aorta. Esophageal hiatal hernia behind the heart. Degenerative changes in the spine and shoulders. Postoperative changes in the cervical spine. IMPRESSION: 1. Developing nodular opacity in the right mid lung. CT suggested for further evaluation. 2. Otherwise, no evidence of active pulmonary disease. Electronically Signed   By: Lucienne Capers M.D.   On: 12/01/2022 19:32   CT CERVICAL SPINE WO CONTRAST  Result Date: 12/01/2022 CLINICAL DATA:  Poly trauma, blunt.  Level 2 fall on thinners EXAM: CT CERVICAL SPINE WITHOUT CONTRAST TECHNIQUE: Multidetector CT imaging of the cervical spine was performed without intravenous contrast. Multiplanar CT image reconstructions were also generated. RADIATION DOSE REDUCTION: This exam was performed according to the departmental dose-optimization program which includes automated exposure control, adjustment of the mA and/or kV according to patient size and/or use of  iterative reconstruction technique. COMPARISON:  01/15/2021 FINDINGS: Alignment: Normal alignment Skull base and vertebrae: No acute fracture. No primary bone lesion or focal pathologic process. Soft tissues and spinal canal: No prevertebral fluid or swelling. No visible canal hematoma. Disc levels: Postoperative changes with anterior plate and screw fixation and intervertebral fusion at C5 through C7. Degenerative changes with disc space narrowing and endplate osteophyte formation most prominent at C4-5 and C7-T1. Degenerative changes in the facet joints. Upper chest: Lung apices are clear.  Other: Postoperative changes in the mediastinum with sternotomy wires visible. IMPRESSION: Normal alignment of the cervical spine. No acute displaced fractures identified. Degenerative and postoperative changes as discussed. Electronically Signed   By: Lucienne Capers M.D.   On: 12/01/2022 19:30   CT HEAD WO CONTRAST  Result Date: 12/01/2022 CLINICAL DATA:  Head trauma, moderate to severe. Level 2 fall on thinners. Poly trauma, blunt EXAM: CT HEAD WITHOUT CONTRAST TECHNIQUE: Contiguous axial images were obtained from the base of the skull through the vertex without intravenous contrast. RADIATION DOSE REDUCTION: This exam was performed according to the departmental dose-optimization program which includes automated exposure control, adjustment of the mA and/or kV according to patient size and/or use of iterative reconstruction technique. COMPARISON:  11/25/2021 FINDINGS: Brain: Diffuse cerebral atrophy. Ventricular dilatation consistent with central atrophy. Low-attenuation changes in the deep white matter consistent with small vessel ischemia. No abnormal extra-axial fluid collections. No mass effect or midline shift. Gray-white matter junctions are distinct. Basal cisterns are not effaced. No acute intracranial hemorrhage. Old encephalomalacia in the right anterior frontal lobe is unchanged since prior study, likely old infarct. Vascular: Intracranial arterial calcifications. Skull: Normal. Negative for fracture or focal lesion. Sinuses/Orbits: No acute finding. Other: None. IMPRESSION: No acute intracranial abnormalities. Chronic atrophy and small vessel ischemic changes. Electronically Signed   By: Lucienne Capers M.D.   On: 12/01/2022 19:27    Procedures .Marland KitchenLaceration Repair  Date/Time: 12/01/2022 9:27 PM  Performed by: Dorothyann Peng, PA-C Authorized by: Dorothyann Peng, PA-C   Consent:    Consent obtained:  Verbal   Consent given by:  Patient   Risks, benefits, and alternatives were  discussed: yes     Risks discussed:  Infection, pain, poor cosmetic result, need for additional repair, nerve damage, poor wound healing and vascular damage   Alternatives discussed:  No treatment Universal protocol:    Procedure explained and questions answered to patient or proxy's satisfaction: yes     Imaging studies available: yes     Required blood products, implants, devices, and special equipment available: yes     Immediately prior to procedure, a time out was called: yes     Patient identity confirmed:  Verbally with patient and arm band Anesthesia:    Anesthesia method:  Topical application and local infiltration   Topical anesthetic:  LET   Local anesthetic:  Lidocaine 2% WITH epi Laceration details:    Location:  Scalp   Scalp location:  Occipital   Length (cm):  3   Depth (mm):  5 Pre-procedure details:    Preparation:  Patient was prepped and draped in usual sterile fashion Exploration:    Hemostasis achieved with:  Epinephrine and direct pressure   Wound exploration: entire depth of wound visualized   Treatment:    Area cleansed with:  Saline   Amount of cleaning:  Standard   Irrigation solution:  Sterile saline   Irrigation volume:  150   Irrigation method:  Syringe Skin repair:  Repair method:  Staples   Number of staples:  3 Approximation:    Approximation:  Close Repair type:    Repair type:  Simple Post-procedure details:    Dressing:  Open (no dressing)   Procedure completion:  Tolerated well, no immediate complications     Medications Ordered in ED Medications  acetaminophen (TYLENOL) tablet 650 mg (has no administration in time range)    Or  acetaminophen (TYLENOL) suppository 650 mg (has no administration in time range)  melatonin tablet 3 mg (has no administration in time range)  ondansetron (ZOFRAN) injection 4 mg (has no administration in time range)  naloxone (NARCAN) injection 0.4 mg (has no administration in time range)  fentaNYL  (SUBLIMAZE) injection 50 mcg (has no administration in time range)  HYDROmorphone (DILAUDID) injection 2 mg (has no administration in time range)  morphine (PF) 4 MG/ML injection 4 mg (4 mg Intravenous Given 12/01/22 1800)  HYDROmorphone (DILAUDID) injection 0.5 mg (0.5 mg Intravenous Given 12/01/22 2002)  sodium chloride 0.9 % bolus 1,000 mL (1,000 mLs Intravenous New Bag/Given 12/01/22 2003)  lidocaine-EPINEPHrine-tetracaine (LET) topical gel (3 mLs Topical Given 12/01/22 2046)  lidocaine-EPINEPHrine (XYLOCAINE W/EPI) 2 %-1:200000 (PF) injection 20 mL (20 mLs Infiltration Given 12/01/22 2126)    ED Course/ Medical Decision Making/ A&P                             Medical Decision Making Amount and/or Complexity of Data Reviewed Labs: ordered. Radiology: ordered.  Risk Prescription drug management.   This patient presents to the ED for concern of left hip pain and laceration to head secondary to fall, this involves an extensive number of treatment options, and is a complaint that carries with it a high risk of complications and morbidity.  The differential diagnosis includes intracranial abnormality, fracture, dislocation, soft tissue injury, and others   Co morbidities that complicate the patient evaluation  Previous intracranial hemorrhage, CVA, TIA, arthritis, dementia   Additional history obtained:  Additional history obtained from EMS External records from outside source obtained and reviewed including orthopedic notes from [facility]o care documenting left knee arthritis.  Plavix appears to have been stopped indefinitely due to frequent falls   Lab Tests:  I Ordered, and personally interpreted labs.  The pertinent results include: Creatinine 1.43 (baseline around 1-1.1) unremarkable CBC, initial lactic acid 2.1 (likely reactive)   Imaging Studies ordered:  I ordered imaging studies including plain films of the left hip, left knee, chest; CTs without contrast of the head, cervical  spine, lumbar spine, and thoracic spine I independently visualized and interpreted imaging which showed no acute processes on CT scans.  Left hip films shows femoral neck fracture I agree with the radiologist interpretation   Cardiac Monitoring: / EKG:  The patient was maintained on a cardiac monitor.  I personally viewed and interpreted the cardiac monitored which showed an underlying rhythm of: Likely atrial fibrillation   Consultations Obtained:  I requested consultation with the orthopedic surgeon, Dr.Bokshan,  and discussed lab and imaging findings as well as pertinent plan - they recommend: Medicine admit I requested consultation with the hospitalist.  Dr. Velia Meyer agreed to see the patient for admission   Problem List / ED Course / Critical interventions / Medication management   I ordered medication including morphine, dilaudid for pain control  Reevaluation of the patient after these medicines showed that the patient improved I have reviewed the patients home medicines and have made  adjustments as needed   Social Determinants of Health:  Patient lives in a nursing facility   Test / Admission - Considered:  The patient has an active left hip fracture.  This will require admission for likely surgical management.  Patient did have a 3 cm laceration to the posterior scalp which was repaired with staples.  These will need to be removed in approximately 5 to 10 days.        Final Clinical Impression(s) / ED Diagnoses Final diagnoses:  Fall, initial encounter  Left displaced femoral neck fracture ([place])  Laceration of scalp, initial encounter  AKI (acute kidney injury) [facility])    Rx / DC Orders ED Discharge Orders     None         Ronny Bacon 12/01/22 2130    Gareth Morgan, MD 12/02/22 1656

## 2022-12-01 NOTE — Progress Notes (Signed)
Trauma Response Nurse Documentation   John [name] is a 88 y.o. male arriving to [facility] ED via EMS  On Eliquis (apixaban) daily. Trauma was activated as a Level 2 by Dr. Billy Fischer based on the following trauma criteria Elderly patients > 65 with head trauma on anti-coagulation (excluding ASA). Trauma team at the bedside on patient arrival.   Patient cleared for CT by Dr. Billy Fischer. Pt transported to CT with trauma response nurse present to monitor. RN remained with the patient throughout their absence from the department for clinical observation.   GCS 14.  History   Past Medical History:  Diagnosis Date   Anxiety    Arthritis    "lower back; left ankle" (03/20/2014)   ASTHMA    Blood transfusion    "related to OHS, ulcers"   CAD (coronary artery disease)    a. s/p 2V CABG 1996. b. Last cath 12/2016 -> patent grafts, med rx.   CAD, ARTERY BYPASS GRAFT    Cataract    CKD (chronic kidney disease), stage II    Colon polyps    adenomatous polyps   COPD    Depression    Diabetes mellitus without complication (HCC)    no meds now   Diverticulosis    Duodenal ulcer without hemorrhage or perforation 12/29/2011   GASTROESOPHAGEAL REFLUX DISEASE    HYPERLIPIDEMIA    Insomnia    Leukodystrophy    Memory loss    Nephrolithiasis    "I've had over 320 kidney stones" (03/20/2014)   NEPHROLITHIASIS, HX OF 03/12/2009   Parkinsonism    Sleep apnea    does not use CPAP   Stroke (HCC)    TIA   SUPRAVENTRICULAR TACHYCARDIA, HX OF    SVT (supraventricular tachycardia) 03/12/2009   TIA    Ulcer      Past Surgical History:  Procedure Laterality Date   ANKLE FRACTURE SURGERY Left ~ 2012   CARDIAC CATHETERIZATION     COLONOSCOPY     CORONARY ARTERY BYPASS GRAFT  1996   CABG X2   CYSTOSCOPY WITH URETEROSCOPY, STONE BASKETRY AND STENT PLACEMENT     Cystourethroscopy with stent removal.     EXCISIONAL HEMORRHOIDECTOMY     GASTRECTOMY     HIATAL HERNIA REPAIR     IRRIGATION AND  DEBRIDEMENT SEBACEOUS CYST     "off my back   KNEE ARTHROSCOPY WITH MEDIAL MENISECTOMY Left 08/24/2019   Procedure: LEFT KNEE ARTHROSCOPY WITH PARTIAL MEDIAL MENISCECTOMY;  Surgeon: Leandrew Koyanagi, MD;  Location: [facility];  Service: Orthopedics;  Laterality: Left;   LEFT HEART CATH AND CORS/GRAFTS ANGIOGRAPHY N/A 12/23/2016   Procedure: Left Heart Cath and Cors/Grafts Angiography;  Surgeon: Burnell Blanks, MD;  Location: [place] [facility];  Service: Cardiovascular;  Laterality: N/A;   POLYPECTOMY     PROSTATE SURGERY     "took the center of my prostate out"   UPPER GASTROINTESTINAL ENDOSCOPY     VAGOTOMY         Initial Focused Assessment (If applicable, or please see trauma documentation): Patient fell at SNF, 3-4 inch Head lac to the back of head   CT's Completed: Head Neck Chest   Interventions: Cleaned wound, PIV, Labs, Xray, and CT    Event Summary: Patient had an unwitnessed fall at his SNF. Patient hit head. Has a 3-4 inch lac to the back of the head. No LOC was noted. Patient is confused, Patient is c/o left hip pain.  Bedside handoff with ED RN Marye Round RN.    Stephens RN  Please call TRN at [phone] for further assistance.

## 2022-12-01 NOTE — Progress Notes (Signed)
Orthopedic Tech Progress Note Patient Details:  John [name] 1934-11-13 [id number]  Level 2 trauma   Patient ID: John [name], male   DOB: 1935/06/18, 88 y.o.   MRN: [id number]  Janit Pagan 12/01/2022, 6:14 PM

## 2022-12-01 NOTE — ED Triage Notes (Signed)
PT BIBGEMS for an unwitnessed fall. Pt got up out of bed for an alarm. Pt usually uses a walker, but got up without one. Pt had a mechanical fall and hit head. Laceration on the occiput approximately 3-4 inches. Denies LOC. Pt c/o left hip pain. GCS 14

## 2022-12-02 ENCOUNTER — Other Ambulatory Visit: Payer: Self-pay | Admitting: Internal Medicine

## 2022-12-02 DIAGNOSIS — S72002A Fracture of unspecified part of neck of left femur, initial encounter for closed fracture: Secondary | ICD-10-CM | POA: Diagnosis not present

## 2022-12-02 LAB — CBC WITH DIFFERENTIAL/PLATELET
Abs Immature Granulocytes: 0.05 10*3/uL (ref 0.00–0.07)
Basophils Absolute: 0 10*3/uL (ref 0.0–0.1)
Basophils Relative: 0 %
Eosinophils Absolute: 0 10*3/uL (ref 0.0–0.5)
Eosinophils Relative: 0 %
HCT: 36.9 % — ABNORMAL LOW (ref 39.0–52.0)
Hemoglobin: 12.6 g/dL — ABNORMAL LOW (ref 13.0–17.0)
Immature Granulocytes: 1 %
Lymphocytes Relative: 11 %
Lymphs Abs: 1 10*3/uL (ref 0.7–4.0)
MCH: 33.2 pg (ref 26.0–34.0)
MCHC: 34.1 g/dL (ref 30.0–36.0)
MCV: 97.4 fL (ref 80.0–100.0)
Monocytes Absolute: 0.7 10*3/uL (ref 0.1–1.0)
Monocytes Relative: 8 %
Neutro Abs: 7.5 10*3/uL (ref 1.7–7.7)
Neutrophils Relative %: 80 %
Platelets: 191 10*3/uL (ref 150–400)
RBC: 3.79 MIL/uL — ABNORMAL LOW (ref 4.22–5.81)
RDW: 13.5 % (ref 11.5–15.5)
WBC: 9.3 10*3/uL (ref 4.0–10.5)
nRBC: 0 % (ref 0.0–0.2)

## 2022-12-02 LAB — COMPREHENSIVE METABOLIC PANEL
ALT: 16 U/L (ref 0–44)
AST: 23 U/L (ref 15–41)
Albumin: 3.3 g/dL — ABNORMAL LOW (ref 3.5–5.0)
Alkaline Phosphatase: 56 U/L (ref 38–126)
Anion gap: 13 (ref 5–15)
BUN: 20 mg/dL (ref 8–23)
CO2: 20 mmol/L — ABNORMAL LOW (ref 22–32)
Calcium: 8.5 mg/dL — ABNORMAL LOW (ref 8.9–10.3)
Chloride: 105 mmol/L (ref 98–111)
Creatinine, Ser: 1.48 mg/dL — ABNORMAL HIGH (ref 0.61–1.24)
GFR, Estimated: 46 mL/min — ABNORMAL LOW (ref >60)
Glucose, Bld: 144 mg/dL — ABNORMAL HIGH (ref 70–99)
Potassium: 4.1 mmol/L (ref 3.5–5.1)
Sodium: 138 mmol/L (ref 135–145)
Total Bilirubin: 1.2 mg/dL (ref 0.3–1.2)
Total Protein: 6 g/dL — ABNORMAL LOW (ref 6.5–8.1)

## 2022-12-02 LAB — MRSA NEXT GEN BY PCR, NASAL: MRSA by PCR Next Gen: NOT DETECTED

## 2022-12-02 LAB — HEMOGLOBIN A1C
Hgb A1c MFr Bld: 5.4 % (ref 4.8–5.6)
Mean Plasma Glucose: 108.28 mg/dL

## 2022-12-02 LAB — LACTIC ACID, PLASMA: Lactic Acid, Venous: 1.5 mmol/L (ref 0.5–1.9)

## 2022-12-02 LAB — MAGNESIUM: Magnesium: 1.7 mg/dL (ref 1.7–2.4)

## 2022-12-02 MED ORDER — QUETIAPINE FUMARATE 100 MG PO TABS
100.0000 mg | ORAL_TABLET | Freq: Every day | ORAL | Status: DC
  Administered 2022-12-02 – 2022-12-08 (×7): 100 mg via ORAL
  Filled 2022-12-02 (×7): qty 1

## 2022-12-02 MED ORDER — CHLORHEXIDINE GLUCONATE 4 % EX LIQD
60.0000 mL | Freq: Once | CUTANEOUS | Status: AC
  Administered 2022-12-03: 4 via TOPICAL

## 2022-12-02 MED ORDER — TAMSULOSIN HCL 0.4 MG PO CAPS
0.4000 mg | ORAL_CAPSULE | Freq: Every day | ORAL | Status: DC
  Administered 2022-12-02 – 2022-12-09 (×8): 0.4 mg via ORAL
  Filled 2022-12-02 (×8): qty 1

## 2022-12-02 MED ORDER — POVIDONE-IODINE 10 % EX SWAB
2.0000 | Freq: Once | CUTANEOUS | Status: AC
  Administered 2022-12-03: 2 via TOPICAL

## 2022-12-02 MED ORDER — CEFAZOLIN SODIUM-DEXTROSE 2-4 GM/100ML-% IV SOLN
2.0000 g | INTRAVENOUS | Status: AC
  Administered 2022-12-03: 2 g via INTRAVENOUS
  Filled 2022-12-02: qty 100

## 2022-12-02 MED ORDER — HYDROCODONE-ACETAMINOPHEN 5-325 MG PO TABS
1.0000 | ORAL_TABLET | ORAL | Status: DC | PRN
  Administered 2022-12-02 – 2022-12-07 (×7): 1 via ORAL
  Filled 2022-12-02 (×8): qty 1

## 2022-12-02 MED ORDER — HYDROMORPHONE HCL 1 MG/ML IJ SOLN
1.0000 mg | Freq: Once | INTRAMUSCULAR | Status: AC
  Administered 2022-12-02: 1 mg via INTRAVENOUS
  Filled 2022-12-02: qty 1

## 2022-12-02 MED ORDER — ESCITALOPRAM OXALATE 10 MG PO TABS
10.0000 mg | ORAL_TABLET | Freq: Every day | ORAL | Status: DC
  Administered 2022-12-02 – 2022-12-09 (×8): 10 mg via ORAL
  Filled 2022-12-02 (×8): qty 1

## 2022-12-02 MED ORDER — FINASTERIDE 5 MG PO TABS: 5.0000 mg | ORAL_TABLET | Freq: Every day | ORAL | Status: DC

## 2022-12-02 MED ORDER — HYDROMORPHONE HCL 1 MG/ML IJ SOLN
0.5000 mg | INTRAMUSCULAR | Status: DC | PRN
  Administered 2022-12-02 – 2022-12-03 (×4): 0.5 mg via INTRAVENOUS
  Filled 2022-12-02 (×4): qty 0.5

## 2022-12-02 MED ORDER — TRANEXAMIC ACID-NACL 1000-0.7 MG/100ML-% IV SOLN
1000.0000 mg | INTRAVENOUS | Status: AC
  Administered 2022-12-03: 1000 mg via INTRAVENOUS
  Filled 2022-12-02: qty 100

## 2022-12-02 NOTE — TOC CAGE-AID Note (Signed)
Transition of Care [facility]) - CAGE-AID Screening   Patient Details  Name: John [name] MRN: [id number] Date of Birth: 03/21/1935   Elvina Sidle, RN Trauma Response Nurse Phone Number: [phone] 12/02/2022, 3:56 PM   Clinical Narrative:  Pt in bed, family at bedside -- states he will have surgery tomorrow on hip.  Denies any alcohol use.   CAGE-AID Screening:    Have You Ever Felt You Ought to Cut Down on Your Drinking or Drug Use?: No Have People Annoyed You By Critizing Your Drinking Or Drug Use?: No Have You Felt Bad Or Guilty About Your Drinking Or Drug Use?: No Have You Ever Had a Drink or Used Drugs First Thing In The Morning to Steady Your Nerves or to Get Rid of a Hangover?: No CAGE-AID Score: 0  Substance Abuse Education Offered: No

## 2022-12-02 NOTE — Progress Notes (Signed)
PROGRESS NOTE    John [name]  TWS:[id number] DOB: [birth year] DOA: 12/01/2022 PCP: Unk Pinto, MD    Brief Narrative:  John [name] is a 88 y.o. male with medical history significant for coronary artery disease status two-vessel CABG in 1996, paroxysmal atrial fibrillation, GAD,post due to diabetes mellitus, hypertension, hyperlipidemia, obstructive sleep apnea not on nocturnal CPAP, chronic diastolic heart failure, who is admitted to [facility] on 12/01/2022 with acute left femoral neck fracture after presenting from home to [facility] ED complaining of left hip pain.     Assessment and Plan:   Acute left femoral neck fracture: confirmed via presenting plain films and stemming from ground level fall  -NPO after midnight -Surgery planned for 2/9   -c/o pain   presyncope resulting in ground-level fall: An episode of dizziness, lightheadedness, and sensation of impending loss of consciousness that occurred when the patient was attempting to rise from seated to standing position earlier today, resulting in presenting ground-level fall without formal loss of consciousness.  This presentation appears suggestive of orthostatic hypotension, of which the patient has a documented history        Nodular opacity in the right midlung:  -Presenting chest x-ray shows slight interval increase in prominence of nodular area of scarring in the right midlung relative to plan films of the chest performed in February 2023, which, per radiology, possibly indicates a developing pulmonary nodule.  Radiology recommends CT chest for further correlation, which it appears can be pursued on a nonemergent basis as an outpatient.     Paroxysmal atrial fibrillation: Documented history of such, with documentation of atrial fibrillation/SVT around 2012. In setting of CHA2DS2-VASc score of  7, there is an indication for chronic anticoagulation for thromboembolic prophylaxis.  While the patient was previously  undergoing chronic anticoagulation on Xarelto, this was subsequently discontinued given report of risks versus benefits discussions via outpatient providers in the context of the patient's recurrent orthostatic hypotension and enhanced associated fall risk.  Consequently, the patient is not currently on any blood thinners as an outpatient.  Home AV nodal blocking regimen: None.    Chronic diastolic heart failure: documented history of such, with most recent echocardiogram performed in November 2020, which was notable for grade 1 diastolic dysfunction, and additional details as conveyed above. No clinical or evidence to suggest acutely decompensated heart failure at this time. home diuretic regimen reportedly consists of the following: None.   -monitor      Generalized anxiety disorder:  -resume home meds   type 2 diabetes mellitus:  -Most recent hemoglobin A1c was noted to be 5.3% in June 2021.     -diet controlled     GERD: -resume home meds    Hyperlipidemia:  -resume home meds    Essential Hypertension:  -resume home meds     Benign Prostatic Hyperplasia:   -on tamsulosin as well as finasteride as outpatient.  -reorder   CKD stage 3a -monitor with labs      DVT prophylaxis: SCDs Start: 12/01/22 2043    Code Status: Full Code   Disposition Plan:  Level of care: Telemetry Medical Status is: Inpatient Remains inpatient appropriate because: needs surgery    Consultants:  ortho   Subjective: Having pain not relieved by fentanyl  Objective: Vitals:   12/02/22 0624 12/02/22 0654 12/02/22 0724 12/02/22 0753  BP: 134/73 135/73 (!) 152/76   Pulse: 94 89 87   Resp: '17 19 19   '$ Temp:    98.3 F (36.8  C)  TempSrc:    John  SpO2: 96% 95% 98%   Weight:      Height:        Intake/Output Summary (Last 24 hours) at 12/02/2022 1014 Last data filed at 12/02/2022 0500 Gross per 24 hour  Intake --  Output 300 ml  Net -300 ml   Filed Weights   12/01/22 1939  Weight:  83 kg    Examination:   General: Appearance:    Well developed, well nourished male in no acute distress     Lungs:     respirations unlabored  Heart:    Normal heart rate.   MS:   All extremities are intact.    Neurologic:   Awake, alert       Data Reviewed: I have personally reviewed following labs and imaging studies  CBC: Recent Labs  Lab 12/01/22 1757 12/01/22 1807 12/02/22 0240  WBC 10.3  --  9.3  NEUTROABS  --   --  7.5  HGB 14.3 14.3 12.6*  HCT 41.6 42.0 36.9*  MCV 97.4  --  97.4  PLT 202  --  086   Basic Metabolic Panel: Recent Labs  Lab 12/01/22 1757 12/01/22 1807 12/01/22 2311 12/02/22 0240  NA 141 142  --  138  K 3.9 3.9  --  4.1  CL 104 106  --  105  CO2 22  --   --  20*  GLUCOSE 96 93  --  144*  BUN 19 20  --  20  CREATININE 1.43* 1.40*  --  1.48*  CALCIUM 9.1  --   --  8.5*  MG  --   --  1.7 1.7   GFR: Estimated Creatinine Clearance: 40.1 mL/min (A) (by C-G formula based on SCr of 1.48 mg/dL (H)). Liver Function Tests: Recent Labs  Lab 12/01/22 1757 12/02/22 0240  AST 18 23  ALT 12 16  ALKPHOS 63 56  BILITOT 0.6 1.2  PROT 6.7 6.0*  ALBUMIN 3.9 3.3*   No results for input(s): "LIPASE", "AMYLASE" in the last 168 hours. No results for input(s): "AMMONIA" in the last 168 hours. Coagulation Profile: Recent Labs  Lab 12/01/22 2311  INR 1.2   Cardiac Enzymes: No results for input(s): "CKTOTAL", "CKMB", "CKMBINDEX", "TROPONINI" in the last 168 hours. BNP (last 3 results) No results for input(s): "PROBNP" in the last 8760 hours. HbA1C: Recent Labs    12/02/22 0240  HGBA1C 5.4   CBG: No results for input(s): "GLUCAP" in the last 168 hours. Lipid Profile: No results for input(s): "CHOL", "HDL", "LDLCALC", "TRIG", "CHOLHDL", "LDLDIRECT" in the last 72 hours. Thyroid Function Tests: No results for input(s): "TSH", "T4TOTAL", "FREET4", "T3FREE", "THYROIDAB" in the last 72 hours. Anemia Panel: No results for input(s):  "VITAMINB12", "FOLATE", "FERRITIN", "TIBC", "IRON", "RETICCTPCT" in the last 72 hours. Sepsis Labs: Recent Labs  Lab 12/01/22 1757 12/02/22 0240  LATICACIDVEN 2.1* 1.5    No results found for this or any previous visit (from the past 240 hour(s)).       Radiology Studies: CT THORACIC SPINE WO CONTRAST  Result Date: 12/01/2022 CLINICAL DATA:  Fall.  Back pain. EXAM: CT THORACIC SPINE WITHOUT CONTRAST TECHNIQUE: Multidetector CT images of the thoracic were obtained using the standard protocol without intravenous contrast. RADIATION DOSE REDUCTION: This exam was performed according to the departmental dose-optimization program which includes automated exposure control, adjustment of the mA and/or kV according to patient size and/or use of iterative reconstruction technique. COMPARISON:  Chest radiograph  dated 12/01/2022 and 11/25/2021. FINDINGS: Evaluation of this exam is limited due to motion artifact. Alignment: No acute subluxation. Vertebrae: No acute fracture. Paraspinal and other soft tissues: No acute finding. No perispinal fluid collection or hematoma. There are areas of scarring in the lungs bilaterally. Evaluation of the lungs is limited due to respiratory motion. There is a moderate size hiatal hernia. There is coronary vascular calcification. Disc levels: No acute findings.  Degenerative changes. IMPRESSION: No acute/traumatic thoracic spine pathology. Electronically Signed   By: Anner Crete M.D.   On: 12/01/2022 19:34   DG Knee Left Port  Result Date: 12/01/2022 CLINICAL DATA:  Left hip fracture, left thigh pain, level 2 trauma, fall, left hip pain EXAM: PORTABLE LEFT KNEE - 1-2 VIEW COMPARISON:  None 2520 FINDINGS: Degenerative changes in the left knee with medial compartment narrowing and small osteophyte formation. No evidence of acute fracture or dislocation. No focal bone lesion or bone destruction. No significant effusions. Vascular calcifications in the soft tissues.  Degenerative changes have progressed since prior study. IMPRESSION: Mild progressing degenerative changes in the left knee. No acute displaced fractures are identified. Electronically Signed   By: Lucienne Capers M.D.   On: 12/01/2022 19:34   DG Hip Unilat W or Wo Pelvis 2-3 Views Left  Result Date: 12/01/2022 CLINICAL DATA:  Trauma.  Level 2 trauma.  Fall.  Left hip pain. EXAM: DG HIP (WITH OR WITHOUT PELVIS) 2-3V LEFT COMPARISON:  None Available. FINDINGS: Transverse fracture of the left femoral neck with varus angulation of the fracture fragments. No evidence of involvement of the inter trochanteric region. Visualized pelvis appears intact. Visualized sacrum appears intact. SI joints and symphysis pubis are not displaced. IMPRESSION: Transverse fracture left femoral neck with varus angulation of fracture fragments. Electronically Signed   By: Lucienne Capers M.D.   On: 12/01/2022 19:32   CT Lumbar Spine Wo Contrast  Result Date: 12/01/2022 CLINICAL DATA:  [place], trauma EXAM: CT LUMBAR SPINE WITHOUT CONTRAST TECHNIQUE: Multidetector CT imaging of the lumbar spine was performed without intravenous contrast administration. Multiplanar CT image reconstructions were also generated. RADIATION DOSE REDUCTION: This exam was performed according to the departmental dose-optimization program which includes automated exposure control, adjustment of the mA and/or kV according to patient size and/or use of iterative reconstruction technique. COMPARISON:  06/25/2015 FINDINGS: Segmentation: 5 lumbar type vertebrae. Alignment: Mild right convex scoliosis centered at L3. Otherwise alignment is anatomic. Vertebrae: There is mild invagination of the superior endplate of the L1 vertebral body, consistent with age-indeterminate fracture. Would favor chronic change given the lack of visible fracture line or associated paraspinal soft tissue swelling. No other acute lumbar spine fractures. Unilateral right pars defect at L5 with  no evidence of spondylolisthesis. 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 Paraspinal and other soft tissues: Paraspinal soft tissues are unremarkable. Atherosclerosis within the aorta and its distal branches. Disc levels: Disc space height is relatively well preserved. Mild broad-based disc bulge at L2-3 and L3-4 without significant compressive sequela. Mild broad-based disc bulge at the L5-S1 level results in minimal symmetrical neural foraminal encroachment. IMPRESSION: 1. Slight invagination superior endplate L1 vertebral body, likely chronic. No other signs of acute lumbar spine fracture. 2. Mild spondylosis. 3. 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 Electronically Signed   By: Randa Ngo M.D.   On: 12/01/2022 19:32   DG Chest Port 1 View  Result Date: 12/01/2022 CLINICAL DATA:  Trauma.  Level  2 trauma.  Fall.  Left hip pain. EXAM: PORTABLE CHEST 1 VIEW COMPARISON:  11/25/2021 FINDINGS: Postoperative changes in the mediastinum. Heart size and pulmonary vascularity are normal. Nodular area of scarring suggested in the right mid lung is more prominent than on prior study, possibly indicating a developing pulmonary nodule. CT correlation is suggested. No airspace disease or consolidation otherwise identified. No pleural effusions. No pneumothorax. Mediastinal contours appear intact. Calcified and tortuous aorta. Esophageal hiatal hernia behind the heart. Degenerative changes in the spine and shoulders. Postoperative changes in the cervical spine. IMPRESSION: 1. Developing nodular opacity in the right mid lung. CT suggested for further evaluation. 2. Otherwise, no evidence of active pulmonary disease. Electronically Signed   By: Lucienne Capers M.D.   On: 12/01/2022 19:32   CT CERVICAL SPINE WO CONTRAST  Result Date: 12/01/2022 CLINICAL DATA:  Poly trauma, blunt.  Level 2 fall on thinners EXAM: CT CERVICAL SPINE WITHOUT  CONTRAST TECHNIQUE: Multidetector CT imaging of the cervical spine was performed without intravenous contrast. Multiplanar CT image reconstructions were also generated. RADIATION DOSE REDUCTION: This exam was performed according to the departmental dose-optimization program which includes automated exposure control, adjustment of the mA and/or kV according to patient size and/or use of iterative reconstruction technique. COMPARISON:  01/15/2021 FINDINGS: Alignment: Normal alignment Skull base and vertebrae: No acute fracture. No primary bone lesion or focal pathologic process. Soft tissues and spinal canal: No prevertebral fluid or swelling. No visible canal hematoma. Disc levels: Postoperative changes with anterior plate and screw fixation and intervertebral fusion at C5 through C7. Degenerative changes with disc space narrowing and endplate osteophyte formation most prominent at C4-5 and C7-T1. Degenerative changes in the facet joints. Upper chest: Lung apices are clear. Other: Postoperative changes in the mediastinum with sternotomy wires visible. IMPRESSION: Normal alignment of the cervical spine. No acute displaced fractures identified. Degenerative and postoperative changes as discussed. Electronically Signed   By: Lucienne Capers M.D.   On: 12/01/2022 19:30   CT HEAD WO CONTRAST  Result Date: 12/01/2022 CLINICAL DATA:  Head trauma, moderate to severe. Level 2 fall on thinners. Poly trauma, blunt EXAM: CT HEAD WITHOUT CONTRAST TECHNIQUE: Contiguous axial images were obtained from the base of the skull through the vertex without intravenous contrast. RADIATION DOSE REDUCTION: This exam was performed according to the departmental dose-optimization program which includes automated exposure control, adjustment of the mA and/or kV according to patient size and/or use of iterative reconstruction technique. COMPARISON:  11/25/2021 FINDINGS: Brain: Diffuse cerebral atrophy. Ventricular dilatation consistent with  central atrophy. Low-attenuation changes in the deep white matter consistent with small vessel ischemia. No abnormal extra-axial fluid collections. No mass effect or midline shift. Gray-white matter junctions are distinct. Basal cisterns are not effaced. No acute intracranial hemorrhage. Old encephalomalacia in the right anterior frontal lobe is unchanged since prior study, likely old infarct. Vascular: Intracranial arterial calcifications. Skull: Normal. Negative for fracture or focal lesion. Sinuses/Orbits: No acute finding. Other: None. IMPRESSION: No acute intracranial abnormalities. Chronic atrophy and small vessel ischemic changes. Electronically Signed   By: Lucienne Capers M.D.   On: 12/01/2022 19:27        Scheduled Meds:  midodrine  5 mg John BID WC   pantoprazole (PROTONIX) IV  40 mg Intravenous Q24H   Continuous Infusions:   LOS: 1 day    Time spent: 45 minutes spent on chart review, discussion with nursing staff, consultants, updating family and interview/physical exam; more than 50% of that time was spent in  counseling and/or coordination of care.    Geradine Girt, DO [facility] Available via Epic secure chat 7am-7pm After these hours, please refer to coverage provider listed on [email or web address] 12/02/2022, 10:14 AM

## 2022-12-02 NOTE — Consult Note (Signed)
Reason for Consult:Left hip fx Referring Physician: Eulogio Bear Time called: [id number] Time at bedside: John [name] is an 88 y.o. male.  HPI: John [name] was getting out of bed when he developed vertigo and fell to the floor. He had immediate left hip pain and could not get up. He was brought to the ED where x-rays showed a left hip fx and orthopedic surgery was consulted.   Past Medical History:  Diagnosis Date   Anxiety    Arthritis    "lower back; left ankle" (03/20/2014)   ASTHMA    Blood transfusion    "related to OHS, ulcers"   CAD (coronary artery disease)    a. s/p 2V CABG 1996. b. Last cath 12/2016 -> patent grafts, med rx.   CAD, ARTERY BYPASS GRAFT    Cataract    CKD (chronic kidney disease), stage II    Colon polyps    adenomatous polyps   COPD    Depression    Diabetes mellitus without complication (HCC)    no meds now   Diverticulosis    Duodenal ulcer without hemorrhage or perforation 12/29/2011   GASTROESOPHAGEAL REFLUX DISEASE    HYPERLIPIDEMIA    Insomnia    Leukodystrophy    Memory loss    Nephrolithiasis    "I've had over 320 kidney stones" (03/20/2014)   NEPHROLITHIASIS, HX OF 03/12/2009   Parkinsonism    Sleep apnea    does not use CPAP   Stroke (HCC)    TIA   SUPRAVENTRICULAR TACHYCARDIA, HX OF    SVT (supraventricular tachycardia) 03/12/2009   TIA    Ulcer     Past Surgical History:  Procedure Laterality Date   ANKLE FRACTURE SURGERY Left ~ 2012   CARDIAC CATHETERIZATION     COLONOSCOPY     CORONARY ARTERY BYPASS GRAFT  1996   CABG X2   CYSTOSCOPY WITH URETEROSCOPY, STONE BASKETRY AND STENT PLACEMENT     Cystourethroscopy with stent removal.     EXCISIONAL HEMORRHOIDECTOMY     GASTRECTOMY     HIATAL HERNIA REPAIR     IRRIGATION AND DEBRIDEMENT SEBACEOUS CYST     "off my back   KNEE ARTHROSCOPY WITH MEDIAL MENISECTOMY Left 08/24/2019   Procedure: LEFT KNEE ARTHROSCOPY WITH PARTIAL MEDIAL MENISCECTOMY;  Surgeon: Leandrew Koyanagi, MD;   Location: [facility];  Service: Orthopedics;  Laterality: Left;   LEFT HEART CATH AND CORS/GRAFTS ANGIOGRAPHY N/A 12/23/2016   Procedure: Left Heart Cath and Cors/Grafts Angiography;  Surgeon: Burnell Blanks, MD;  Location: [facility];  Service: Cardiovascular;  Laterality: N/A;   POLYPECTOMY     PROSTATE SURGERY     "took the center of my prostate out"   UPPER GASTROINTESTINAL ENDOSCOPY     VAGOTOMY      Family History  Problem Relation Age of Onset   Alcohol abuse Father    Stroke Mother    Heart disease Brother    Diabetes Brother    Heart disease Paternal Grandmother    Colon cancer Neg Hx    Stomach cancer Neg Hx    Esophageal cancer Neg Hx    Rectal cancer Neg Hx     Social History:  reports that he quit smoking about 60 years ago. His smoking use included cigarettes. He smoked an average of .5 packs per day. He quit smokeless tobacco use about 4 years ago.  His smokeless tobacco use included chew. He reports that he does  not drink alcohol and does not use drugs.  Allergies:  Allergies  Allergen Reactions   Gabapentin Other (See Comments)    Severe confusion   Prozac [Fluoxetine Hcl] Other (See Comments)    Patient state that it does not agree with him   Soma [Carisoprodol] Other (See Comments)    Patient stated that it does not agree with him   Beta Adrenergic Blockers Other (See Comments)    REACTION: Bradycardia    Medications: I have reviewed the patient's current medications.  Results for orders placed or performed during the hospital encounter of 12/01/22 (from the past 48 hour(s))  Sample to Blood Bank     Status: None   Collection Time: 12/01/22  5:40 PM  Result Value Ref Range   Blood Bank Specimen SAMPLE AVAILABLE FOR TESTING    Sample Expiration      12/02/2022,2359 Performed at [facility], [employer] [address], [place], [place] [address]   Comprehensive metabolic panel     Status: Abnormal   Collection Time: 12/01/22   5:57 PM  Result Value Ref Range   Sodium 141 135 - 145 mmol/L   Potassium 3.9 3.5 - 5.1 mmol/L   Chloride 104 98 - 111 mmol/L   CO2 22 22 - 32 mmol/L   Glucose, Bld 96 70 - 99 mg/dL    Comment: Glucose reference range applies only to samples taken after fasting for at least 8 hours.   BUN 19 8 - 23 mg/dL   Creatinine, Ser 1.43 (H) 0.61 - 1.24 mg/dL   Calcium 9.1 8.9 - 10.3 mg/dL   Total Protein 6.7 6.5 - 8.1 g/dL   Albumin 3.9 3.5 - 5.0 g/dL   AST 18 15 - 41 U/L   ALT 12 0 - 44 U/L   Alkaline Phosphatase 63 38 - 126 U/L   Total Bilirubin 0.6 0.3 - 1.2 mg/dL   GFR, Estimated 48 (L) >60 mL/min    Comment: (NOTE) Calculated using the CKD-EPI Creatinine Equation (2021)    Anion gap 15 5 - 15    Comment: Performed at [address], [place], [place] [address]  CBC     Status: None   Collection Time: 12/01/22  5:57 PM  Result Value Ref Range   WBC 10.3 4.0 - 10.5 K/uL   RBC 4.27 4.22 - 5.81 MIL/uL   Hemoglobin 14.3 13.0 - 17.0 g/dL   HCT 41.6 39.0 - 52.0 %   MCV 97.4 80.0 - 100.0 fL   MCH 33.5 26.0 - 34.0 pg   MCHC 34.4 30.0 - 36.0 g/dL   RDW 13.4 11.5 - 15.5 %   Platelets 202 150 - 400 K/uL   nRBC 0.0 0.0 - 0.2 %    Comment: Performed at [facility], [place] [address], [place], [place] [address]  Lactic acid, plasma     Status: Abnormal   Collection Time: 12/01/22  5:57 PM  Result Value Ref Range   Lactic Acid, Venous 2.1 (HH) 0.5 - 1.9 mmol/L    Comment: CRITICAL RESULT CALLED TO, READ BACK BY AND VERIFIED WITH e anello,rn 1859 12/01/2022 wbond Performed at [facility], [place] [address] [place],  [address]   I-Stat Chem 8, ED     Status: Abnormal   Collection Time: 12/01/22  6:07 PM  Result Value Ref Range   Sodium 142 135 - 145 mmol/L   Potassium 3.9 3.5 - 5.1 mmol/L   Chloride  106 98 - 111 mmol/L   BUN 20 8 - 23 mg/dL   Creatinine, Ser 1.40 (H) 0.61 - 1.24 mg/dL   Glucose, Bld 93 70 - 99 mg/dL    Comment: Glucose reference range  applies only to samples taken after fasting for at least 8 hours.   Calcium, Ion 1.19 1.15 - 1.40 mmol/L   TCO2 24 22 - 32 mmol/L   Hemoglobin 14.3 13.0 - 17.0 g/dL   HCT 42.0 39.0 - 52.0 %  Urinalysis, Routine w reflex microscopic -Urine, Random     Status: Abnormal   Collection Time: 12/01/22  9:49 PM  Result Value Ref Range   Color, Urine YELLOW YELLOW   APPearance CLEAR CLEAR   Specific Gravity, Urine 1.017 1.005 - 1.030   pH 5.0 5.0 - 8.0   Glucose, UA 50 (A) NEGATIVE mg/dL   Hgb urine dipstick NEGATIVE NEGATIVE   Bilirubin Urine NEGATIVE NEGATIVE   Ketones, ur 20 (A) NEGATIVE mg/dL   Protein, ur NEGATIVE NEGATIVE mg/dL   Nitrite NEGATIVE NEGATIVE   Leukocytes,Ua NEGATIVE NEGATIVE   RBC / HPF 0-5 0 - 5 RBC/hpf   WBC, UA 0-5 0 - 5 WBC/hpf   Bacteria, UA NONE SEEN NONE SEEN   Squamous Epithelial / HPF 0-5 0 - 5 /HPF   Mucus PRESENT     Comment: Performed at [facility], [place] [address], [place], [place] [address]  Type and screen [place]     Status: None   Collection Time: 12/01/22  9:50 PM  Result Value Ref Range   ABO/RH(D) A NEG    Antibody Screen NEG    Sample Expiration      12/04/2022,2359 Performed at [facility], [place] [address], [place], [place] [address]   Ethanol     Status: None   Collection Time: 12/01/22 11:11 PM  Result Value Ref Range   Alcohol, Ethyl (B) <10 <10 mg/dL    Comment: (NOTE) Lowest detectable limit for serum alcohol is 10 mg/dL.  For medical purposes only. Performed at [facility], [place] [address], [place], [place] [address]   Protime-INR     Status: None   Collection Time: 12/01/22 11:11 PM  Result Value Ref Range   Prothrombin Time 15.1 11.4 - 15.2 seconds   INR 1.2 0.8 - 1.2    Comment: (NOTE) INR goal varies based on device and disease states. Performed at [facility], [place] [address], [place], [place] [address]   Magnesium     Status: None   Collection Time: 12/01/22 11:11 PM   Result Value Ref Range   Magnesium 1.7 1.7 - 2.4 mg/dL    Comment: Performed at [place] [address] [place], [place] [address]  CBC with Differential/Platelet     Status: Abnormal   Collection Time: 12/02/22  2:40 AM  Result Value Ref Range   WBC 9.3 4.0 - 10.5 K/uL   RBC 3.79 (L) 4.22 - 5.81 MIL/uL   Hemoglobin 12.6 (L) 13.0 - 17.0 g/dL   HCT 36.9 (L) 39.0 - 52.0 %   MCV 97.4 80.0 - 100.0 fL   MCH 33.2 26.0 - 34.0 pg   MCHC 34.1 30.0 - 36.0 g/dL   RDW 13.5 11.5 - 15.5 %   Platelets 191 150 - 400 K/uL   nRBC 0.0 0.0 - 0.2 %   Neutrophils Relative % 80 %   Neutro Abs 7.5 1.7 - 7.7  K/uL   Lymphocytes Relative 11 %   Lymphs Abs 1.0 0.7 - 4.0 K/uL   Monocytes Relative 8 %   Monocytes Absolute 0.7 0.1 - 1.0 K/uL   Eosinophils Relative 0 %   Eosinophils Absolute 0.0 0.0 - 0.5 K/uL   Basophils Relative 0 %   Basophils Absolute 0.0 0.0 - 0.1 K/uL   Immature Granulocytes 1 %   Abs Immature Granulocytes 0.05 0.00 - 0.07 K/uL    Comment: Performed at [facility], [place] [address], [place], [place] [address]  Comprehensive metabolic panel     Status: Abnormal   Collection Time: 12/02/22  2:40 AM  Result Value Ref Range   Sodium 138 135 - 145 mmol/L   Potassium 4.1 3.5 - 5.1 mmol/L   Chloride 105 98 - 111 mmol/L   CO2 20 (L) 22 - 32 mmol/L   Glucose, Bld 144 (H) 70 - 99 mg/dL    Comment: Glucose reference range applies only to samples taken after fasting for at least 8 hours.   BUN 20 8 - 23 mg/dL   Creatinine, Ser 1.48 (H) 0.61 - 1.24 mg/dL   Calcium 8.5 (L) 8.9 - 10.3 mg/dL   Total Protein 6.0 (L) 6.5 - 8.1 g/dL   Albumin 3.3 (L) 3.5 - 5.0 g/dL   AST 23 15 - 41 U/L   ALT 16 0 - 44 U/L   Alkaline Phosphatase 56 38 - 126 U/L   Total Bilirubin 1.2 0.3 - 1.2 mg/dL   GFR, Estimated 46 (L) >60 mL/min    Comment: (NOTE) Calculated using the CKD-EPI Creatinine Equation (2021)    Anion gap 13 5 - 15    Comment: Performed at [facility], [place] [address], [place], [place] [address]  Magnesium     Status: None   Collection Time: 12/02/22  2:40 AM  Result Value Ref Range   Magnesium 1.7 1.7 - 2.4 mg/dL    Comment: Performed at [facility] [address] [place], [place] [address]  Hemoglobin A1c     Status: None   Collection Time: 12/02/22  2:40 AM  Result Value Ref Range   Hgb A1c MFr Bld 5.4 4.8 - 5.6 %    Comment: (NOTE) Pre diabetes:          5.7%-6.4%  Diabetes:              >6.4%  Glycemic control for   <7.0% adults with diabetes    Mean Plasma Glucose 108.28 mg/dL    Comment: Performed at [facility] [address], [place], [place] [address]  Lactic acid, plasma     Status: None   Collection Time: 12/02/22  2:40 AM  Result Value Ref Range   Lactic Acid, Venous 1.5 0.5 - 1.9 mmol/L    Comment: Performed at [facility] [address] [place], [place] [address]    CT THORACIC SPINE WO CONTRAST  Result Date: 12/01/2022 CLINICAL DATA:  Fall.  Back pain. EXAM: CT THORACIC SPINE WITHOUT CONTRAST TECHNIQUE: Multidetector CT images of the thoracic were obtained using the standard protocol without intravenous contrast. RADIATION DOSE REDUCTION: This exam was performed according to the departmental dose-optimization program which includes automated exposure control, adjustment of the mA and/or kV according to patient size and/or use of iterative reconstruction technique. COMPARISON:  Chest radiograph dated 12/01/2022 and 11/25/2021. FINDINGS: Evaluation of this exam is limited due to motion artifact. Alignment: No acute  subluxation. Vertebrae: No acute fracture. Paraspinal and other soft tissues: No acute finding. No perispinal fluid collection or hematoma. There are areas of scarring in the lungs bilaterally. Evaluation of the lungs is limited due to respiratory motion. There is a moderate size hiatal hernia. There is coronary vascular calcification. Disc levels: No acute findings.  Degenerative changes. IMPRESSION: No  acute/traumatic thoracic spine pathology. Electronically Signed   By: Anner Crete M.D.   On: 12/01/2022 19:34   DG Knee Left Port  Result Date: 12/01/2022 CLINICAL DATA:  Left hip fracture, left thigh pain, level 2 trauma, fall, left hip pain EXAM: PORTABLE LEFT KNEE - 1-2 VIEW COMPARISON:  None 2520 FINDINGS: Degenerative changes in the left knee with medial compartment narrowing and small osteophyte formation. No evidence of acute fracture or dislocation. No focal bone lesion or bone destruction. No significant effusions. Vascular calcifications in the soft tissues. Degenerative changes have progressed since prior study. IMPRESSION: Mild progressing degenerative changes in the left knee. No acute displaced fractures are identified. Electronically Signed   By: Lucienne Capers M.D.   On: 12/01/2022 19:34   DG Hip Unilat W or Wo Pelvis 2-3 Views Left  Result Date: 12/01/2022 CLINICAL DATA:  Trauma.  Level 2 trauma.  Fall.  Left hip pain. EXAM: DG HIP (WITH OR WITHOUT PELVIS) 2-3V LEFT COMPARISON:  None Available. FINDINGS: Transverse fracture of the left femoral neck with varus angulation of the fracture fragments. No evidence of involvement of the inter trochanteric region. Visualized pelvis appears intact. Visualized sacrum appears intact. SI joints and symphysis pubis are not displaced. IMPRESSION: Transverse fracture left femoral neck with varus angulation of fracture fragments. Electronically Signed   By: Lucienne Capers M.D.   On: 12/01/2022 19:32   CT Lumbar Spine Wo Contrast  Result Date: 12/01/2022 CLINICAL DATA:  [place], trauma EXAM: CT LUMBAR SPINE WITHOUT CONTRAST TECHNIQUE: Multidetector CT imaging of the lumbar spine was performed without intravenous contrast administration. Multiplanar CT image reconstructions were also generated. RADIATION DOSE REDUCTION: This exam was performed according to the departmental dose-optimization program which includes automated exposure control, adjustment  of the mA and/or kV according to patient size and/or use of iterative reconstruction technique. COMPARISON:  06/25/2015 FINDINGS: Segmentation: 5 lumbar type vertebrae. Alignment: Mild right convex scoliosis centered at L3. Otherwise alignment is anatomic. Vertebrae: There is mild invagination of the superior endplate of the L1 vertebral body, consistent with age-indeterminate fracture. Would favor chronic change given the lack of visible fracture line or associated paraspinal soft tissue swelling. No other acute lumbar spine fractures. Unilateral right pars defect at L5 with no evidence of spondylolisthesis. 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 Paraspinal and other soft tissues: Paraspinal soft tissues are unremarkable. Atherosclerosis within the aorta and its distal branches. Disc levels: Disc space height is relatively well preserved. Mild broad-based disc bulge at L2-3 and L3-4 without significant compressive sequela. Mild broad-based disc bulge at the L5-S1 level results in minimal symmetrical neural foraminal encroachment. IMPRESSION: 1. Slight invagination superior endplate L1 vertebral body, likely chronic. No other signs of acute lumbar spine fracture. 2. Mild spondylosis. 3. 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 Electronically Signed   By: Randa Ngo M.D.   On: 12/01/2022 19:32   DG Chest Port 1 View  Result Date: 12/01/2022 CLINICAL DATA:  Trauma.  Level 2 trauma.  Fall.  Left hip pain. EXAM: PORTABLE CHEST 1 VIEW COMPARISON:  11/25/2021 FINDINGS: Postoperative  changes in the mediastinum. Heart size and pulmonary vascularity are normal. Nodular area of scarring suggested in the right mid lung is more prominent than on prior study, possibly indicating a developing pulmonary nodule. CT correlation is suggested. No airspace disease or consolidation otherwise identified. No pleural effusions. No pneumothorax.  Mediastinal contours appear intact. Calcified and tortuous aorta. Esophageal hiatal hernia behind the heart. Degenerative changes in the spine and shoulders. Postoperative changes in the cervical spine. IMPRESSION: 1. Developing nodular opacity in the right mid lung. CT suggested for further evaluation. 2. Otherwise, no evidence of active pulmonary disease. Electronically Signed   By: Lucienne Capers M.D.   On: 12/01/2022 19:32   CT CERVICAL SPINE WO CONTRAST  Result Date: 12/01/2022 CLINICAL DATA:  Poly trauma, blunt.  Level 2 fall on thinners EXAM: CT CERVICAL SPINE WITHOUT CONTRAST TECHNIQUE: Multidetector CT imaging of the cervical spine was performed without intravenous contrast. Multiplanar CT image reconstructions were also generated. RADIATION DOSE REDUCTION: This exam was performed according to the departmental dose-optimization program which includes automated exposure control, adjustment of the mA and/or kV according to patient size and/or use of iterative reconstruction technique. COMPARISON:  01/15/2021 FINDINGS: Alignment: Normal alignment Skull base and vertebrae: No acute fracture. No primary bone lesion or focal pathologic process. Soft tissues and spinal canal: No prevertebral fluid or swelling. No visible canal hematoma. Disc levels: Postoperative changes with anterior plate and screw fixation and intervertebral fusion at C5 through C7. Degenerative changes with disc space narrowing and endplate osteophyte formation most prominent at C4-5 and C7-T1. Degenerative changes in the facet joints. Upper chest: Lung apices are clear. Other: Postoperative changes in the mediastinum with sternotomy wires visible. IMPRESSION: Normal alignment of the cervical spine. No acute displaced fractures identified. Degenerative and postoperative changes as discussed. Electronically Signed   By: Lucienne Capers M.D.   On: 12/01/2022 19:30   CT HEAD WO CONTRAST  Result Date: 12/01/2022 CLINICAL DATA:  Head  trauma, moderate to severe. Level 2 fall on thinners. Poly trauma, blunt EXAM: CT HEAD WITHOUT CONTRAST TECHNIQUE: Contiguous axial images were obtained from the base of the skull through the vertex without intravenous contrast. RADIATION DOSE REDUCTION: This exam was performed according to the departmental dose-optimization program which includes automated exposure control, adjustment of the mA and/or kV according to patient size and/or use of iterative reconstruction technique. COMPARISON:  11/25/2021 FINDINGS: Brain: Diffuse cerebral atrophy. Ventricular dilatation consistent with central atrophy. Low-attenuation changes in the deep white matter consistent with small vessel ischemia. No abnormal extra-axial fluid collections. No mass effect or midline shift. Gray-white matter junctions are distinct. Basal cisterns are not effaced. No acute intracranial hemorrhage. Old encephalomalacia in the right anterior frontal lobe is unchanged since prior study, likely old infarct. Vascular: Intracranial arterial calcifications. Skull: Normal. Negative for fracture or focal lesion. Sinuses/Orbits: No acute finding. Other: None. IMPRESSION: No acute intracranial abnormalities. Chronic atrophy and small vessel ischemic changes. Electronically Signed   By: Lucienne Capers M.D.   On: 12/01/2022 19:27    Review of Systems  HENT:  Negative for ear discharge, ear pain, hearing loss and tinnitus.   Eyes:  Negative for photophobia and pain.  Respiratory:  Negative for cough and shortness of breath.   Cardiovascular:  Negative for chest pain.  Gastrointestinal:  Negative for abdominal pain, nausea and vomiting.  Genitourinary:  Negative for dysuria, flank pain, frequency and urgency.  Musculoskeletal:  Positive for arthralgias (Left hip). Negative for back pain, myalgias and neck pain.  Neurological:  Negative for dizziness and headaches.  Hematological:  Does not bruise/bleed easily.  Psychiatric/Behavioral:  The patient  is not nervous/anxious.    Blood pressure (!) 152/76, pulse 87, temperature 98.3 F (36.8 C), temperature source Oral, resp. rate 19, height 6' (1.829 m), weight 83 kg, SpO2 98 %. Physical Exam Constitutional:      General: He is not in acute distress.    Appearance: He is well-developed. He is not diaphoretic.  HENT:     Head: Normocephalic and atraumatic.  Eyes:     General: No scleral icterus.       Right eye: No discharge.        Left eye: No discharge.     Conjunctiva/sclera: Conjunctivae normal.  Cardiovascular:     Rate and Rhythm: Normal rate and regular rhythm.  Pulmonary:     Effort: Pulmonary effort is normal. No respiratory distress.  Musculoskeletal:     Cervical back: Normal range of motion.     Comments: LLE No traumatic wounds, ecchymosis, or rash  Nontender  No knee or ankle effusion  Knee stable to varus/ valgus and anterior/posterior stress  Sens DPN, SPN, TN intact  Motor EHL, ext, flex, evers 5/5  DP 2+, PT 2+, No significant edema  Skin:    General: Skin is warm and dry.  Neurological:     Mental Status: He is alert.  Psychiatric:        Mood and Affect: Mood normal.        Behavior: Behavior normal.     Assessment/Plan: Left hip fx -- Plan THA tomorrow with Dr. Erlinda Hong. Please keep NPO after MN. Multiple medical problems including CAD, afib, DM, HTN, HLD, OSA, and CHF -- per primary service    Lisette Abu, PA-C Orthopedic Surgery [phone] 12/02/2022, 10:57 AM

## 2022-12-03 ENCOUNTER — Inpatient Hospital Stay (HOSPITAL_COMMUNITY): Payer: PPO | Admitting: Physician Assistant

## 2022-12-03 ENCOUNTER — Encounter (HOSPITAL_COMMUNITY): Payer: Self-pay | Admitting: Internal Medicine

## 2022-12-03 ENCOUNTER — Other Ambulatory Visit: Payer: Self-pay

## 2022-12-03 ENCOUNTER — Encounter (HOSPITAL_COMMUNITY)
Admission: EM | Disposition: A | Discharge status: Skilled Nursing Facility | Payer: Self-pay | Source: Home / Self Care | Attending: Internal Medicine

## 2022-12-03 ENCOUNTER — Inpatient Hospital Stay (HOSPITAL_COMMUNITY): Payer: PPO

## 2022-12-03 DIAGNOSIS — J449 Chronic obstructive pulmonary disease, unspecified: Secondary | ICD-10-CM | POA: Diagnosis not present

## 2022-12-03 DIAGNOSIS — Z87891 Personal history of nicotine dependence: Secondary | ICD-10-CM | POA: Diagnosis not present

## 2022-12-03 DIAGNOSIS — F418 Other specified anxiety disorders: Secondary | ICD-10-CM | POA: Diagnosis not present

## 2022-12-03 DIAGNOSIS — S72002A Fracture of unspecified part of neck of left femur, initial encounter for closed fracture: Secondary | ICD-10-CM

## 2022-12-03 HISTORY — PX: TOTAL HIP ARTHROPLASTY: SHX124

## 2022-12-03 LAB — CBC
HCT: 35.4 % — ABNORMAL LOW (ref 39.0–52.0)
Hemoglobin: 12.2 g/dL — ABNORMAL LOW (ref 13.0–17.0)
MCH: 34.1 pg — ABNORMAL HIGH (ref 26.0–34.0)
MCHC: 34.5 g/dL (ref 30.0–36.0)
MCV: 98.9 fL (ref 80.0–100.0)
Platelets: 139 10*3/uL — ABNORMAL LOW (ref 150–400)
RBC: 3.58 MIL/uL — ABNORMAL LOW (ref 4.22–5.81)
RDW: 13.8 % (ref 11.5–15.5)
WBC: 8.6 10*3/uL (ref 4.0–10.5)
nRBC: 0 % (ref 0.0–0.2)

## 2022-12-03 LAB — BASIC METABOLIC PANEL
Anion gap: 11 (ref 5–15)
BUN: 21 mg/dL (ref 8–23)
CO2: 23 mmol/L (ref 22–32)
Calcium: 8.1 mg/dL — ABNORMAL LOW (ref 8.9–10.3)
Chloride: 104 mmol/L (ref 98–111)
Creatinine, Ser: 1.21 mg/dL (ref 0.61–1.24)
GFR, Estimated: 59 mL/min — ABNORMAL LOW (ref >60)
Glucose, Bld: 119 mg/dL — ABNORMAL HIGH (ref 70–99)
Potassium: 3.8 mmol/L (ref 3.5–5.1)
Sodium: 138 mmol/L (ref 135–145)

## 2022-12-03 LAB — GLUCOSE, CAPILLARY
Glucose-Capillary: 107 mg/dL — ABNORMAL HIGH (ref 70–99)
Glucose-Capillary: 122 mg/dL — ABNORMAL HIGH (ref 70–99)
Glucose-Capillary: 119 mg/dL — ABNORMAL HIGH (ref 70–99)

## 2022-12-03 LAB — VITAMIN D 25 HYDROXY (VIT D DEFICIENCY, FRACTURES)

## 2022-12-03 SURGERY — ARTHROPLASTY, HIP, TOTAL, ANTERIOR APPROACH
Anesthesia: Monitor Anesthesia Care | Site: Hip | Laterality: Left

## 2022-12-03 MED ORDER — POLYETHYLENE GLYCOL 3350 17 G PO PACK: 17.0000 g | PACK | Freq: Every day | ORAL | Status: DC | PRN

## 2022-12-03 MED ORDER — DOCUSATE SODIUM 100 MG PO CAPS
100.0000 mg | ORAL_CAPSULE | Freq: Two times a day (BID) | ORAL | Status: DC
  Administered 2022-12-03 – 2022-12-07 (×9): 100 mg via ORAL
  Filled 2022-12-03 (×10): qty 1

## 2022-12-03 MED ORDER — KETAMINE HCL 10 MG/ML IJ SOLN
INTRAMUSCULAR | Status: DC | PRN
  Administered 2022-12-03: 20 mg via INTRAVENOUS

## 2022-12-03 MED ORDER — OXYCODONE HCL 5 MG PO TABS
5.0000 mg | ORAL_TABLET | ORAL | Status: DC | PRN
  Administered 2022-12-06: 5 mg via ORAL
  Administered 2022-12-06 – 2022-12-07 (×2): 10 mg via ORAL
  Filled 2022-12-03: qty 1
  Filled 2022-12-03 (×2): qty 2

## 2022-12-03 MED ORDER — ONDANSETRON HCL 4 MG PO TABS
4.0000 mg | ORAL_TABLET | Freq: Four times a day (QID) | ORAL | Status: DC | PRN
  Filled 2022-12-03: qty 1

## 2022-12-03 MED ORDER — CEFAZOLIN SODIUM-DEXTROSE 2-4 GM/100ML-% IV SOLN
2.0000 g | Freq: Four times a day (QID) | INTRAVENOUS | Status: AC
  Administered 2022-12-03 – 2022-12-04 (×3): 2 g via INTRAVENOUS
  Filled 2022-12-03 (×3): qty 100

## 2022-12-03 MED ORDER — PHENYLEPHRINE HCL (PRESSORS) 10 MG/ML IV SOLN
INTRAVENOUS | Status: AC
  Filled 2022-12-03: qty 1

## 2022-12-03 MED ORDER — MENTHOL 3 MG MT LOZG: 1.0000 | LOZENGE | OROMUCOSAL | Status: DC | PRN

## 2022-12-03 MED ORDER — HYDROMORPHONE HCL 1 MG/ML IJ SOLN
0.5000 mg | INTRAMUSCULAR | Status: DC | PRN
  Administered 2022-12-06 – 2022-12-08 (×5): 1 mg via INTRAVENOUS
  Filled 2022-12-03 (×5): qty 1

## 2022-12-03 MED ORDER — ALUM & MAG HYDROXIDE-SIMETH 200-200-20 MG/5ML PO SUSP: 30.0000 mL | ORAL | Status: DC | PRN

## 2022-12-03 MED ORDER — PRONTOSAN WOUND IRRIGATION OPTIME
TOPICAL | Status: DC | PRN
  Administered 2022-12-03: 1 via TOPICAL

## 2022-12-03 MED ORDER — METHOCARBAMOL 1000 MG/10ML IJ SOLN: 500.0000 mg | Freq: Four times a day (QID) | INTRAVENOUS | Status: DC | PRN

## 2022-12-03 MED ORDER — TRANEXAMIC ACID-NACL 1000-0.7 MG/100ML-% IV SOLN
1000.0000 mg | Freq: Once | INTRAVENOUS | Status: AC
  Administered 2022-12-03: 1000 mg via INTRAVENOUS
  Filled 2022-12-03: qty 100

## 2022-12-03 MED ORDER — TRANEXAMIC ACID 1000 MG/10ML IV SOLN
2000.0000 mg | Freq: Once | INTRAVENOUS | Status: AC
  Administered 2022-12-03: 2000 mg via TOPICAL
  Filled 2022-12-03: qty 20

## 2022-12-03 MED ORDER — CHLORHEXIDINE GLUCONATE 0.12 % MT SOLN
OROMUCOSAL | Status: AC
  Administered 2022-12-03: 15 mL
  Filled 2022-12-03: qty 15

## 2022-12-03 MED ORDER — MAGNESIUM CITRATE PO SOLN: 1.0000 | Freq: Once | ORAL | Status: DC | PRN

## 2022-12-03 MED ORDER — OXYCODONE-ACETAMINOPHEN 5-325 MG PO TABS: 1.0000 | ORAL_TABLET | Freq: Two times a day (BID) | ORAL | 0 refills | Status: DC | PRN

## 2022-12-03 MED ORDER — ACETAMINOPHEN 500 MG PO TABS
1000.0000 mg | ORAL_TABLET | Freq: Four times a day (QID) | ORAL | Status: AC
  Administered 2022-12-03 – 2022-12-04 (×3): 1000 mg via ORAL
  Filled 2022-12-03 (×4): qty 2

## 2022-12-03 MED ORDER — METHOCARBAMOL 500 MG PO TABS
500.0000 mg | ORAL_TABLET | Freq: Four times a day (QID) | ORAL | Status: DC | PRN
  Administered 2022-12-06 – 2022-12-07 (×2): 500 mg via ORAL
  Filled 2022-12-03 (×3): qty 1

## 2022-12-03 MED ORDER — FENTANYL CITRATE (PF) 250 MCG/5ML IJ SOLN
INTRAMUSCULAR | Status: AC
  Filled 2022-12-03: qty 5

## 2022-12-03 MED ORDER — ENOXAPARIN SODIUM 40 MG/0.4ML IJ SOSY: 40.0000 mg | PREFILLED_SYRINGE | Freq: Every day | INTRAMUSCULAR | 0 refills | Status: AC

## 2022-12-03 MED ORDER — KETAMINE HCL 50 MG/5ML IJ SOSY
PREFILLED_SYRINGE | INTRAMUSCULAR | Status: AC
  Filled 2022-12-03: qty 5

## 2022-12-03 MED ORDER — BUPIVACAINE-MELOXICAM ER 400-12 MG/14ML IJ SOLN
INTRAMUSCULAR | Status: DC | PRN
  Administered 2022-12-03: 400 mg

## 2022-12-03 MED ORDER — OXYCODONE HCL 5 MG PO TABS
10.0000 mg | ORAL_TABLET | ORAL | Status: DC | PRN
  Administered 2022-12-06 – 2022-12-08 (×2): 10 mg via ORAL
  Administered 2022-12-08 – 2022-12-09 (×2): 15 mg via ORAL
  Filled 2022-12-03: qty 2
  Filled 2022-12-03: qty 3
  Filled 2022-12-03: qty 2
  Filled 2022-12-03: qty 3

## 2022-12-03 MED ORDER — VANCOMYCIN HCL 1 G IV SOLR
INTRAVENOUS | Status: DC | PRN
  Administered 2022-12-03: 1000 mg via TOPICAL

## 2022-12-03 MED ORDER — SODIUM CHLORIDE 0.9 % IR SOLN
Status: DC | PRN
  Administered 2022-12-03: 1000 mL

## 2022-12-03 MED ORDER — ACETAMINOPHEN 325 MG PO TABS: 325.0000 mg | ORAL_TABLET | Freq: Four times a day (QID) | ORAL | Status: DC | PRN

## 2022-12-03 MED ORDER — SORBITOL 70 % SOLN: 30.0000 mL | Freq: Every day | Status: DC | PRN

## 2022-12-03 MED ORDER — 0.9 % SODIUM CHLORIDE (POUR BTL) OPTIME
TOPICAL | Status: DC | PRN
  Administered 2022-12-03: 1000 mL

## 2022-12-03 MED ORDER — BUPIVACAINE-MELOXICAM ER 400-12 MG/14ML IJ SOLN
INTRAMUSCULAR | Status: AC
  Filled 2022-12-03: qty 1

## 2022-12-03 MED ORDER — PHENOL 1.4 % MT LIQD: 1.0000 | OROMUCOSAL | Status: DC | PRN

## 2022-12-03 MED ORDER — ONDANSETRON HCL 4 MG/2ML IJ SOLN: 4.0000 mg | Freq: Four times a day (QID) | INTRAMUSCULAR | Status: DC | PRN

## 2022-12-03 MED ORDER — FENTANYL CITRATE (PF) 100 MCG/2ML IJ SOLN: 25.0000 ug | INTRAMUSCULAR | Status: DC | PRN

## 2022-12-03 MED ORDER — SODIUM CHLORIDE 0.9 % IV SOLN: INTRAVENOUS | Status: DC

## 2022-12-03 MED ORDER — LACTATED RINGERS IV SOLN: INTRAVENOUS | Status: DC | PRN

## 2022-12-03 MED ORDER — TRANEXAMIC ACID 1000 MG/10ML IV SOLN: 2000.0000 mg | Freq: Once | INTRAVENOUS | Status: DC

## 2022-12-03 MED ORDER — ENOXAPARIN SODIUM 40 MG/0.4ML IJ SOSY
40.0000 mg | PREFILLED_SYRINGE | INTRAMUSCULAR | Status: DC
  Administered 2022-12-04 – 2022-12-09 (×6): 40 mg via SUBCUTANEOUS
  Filled 2022-12-03 (×6): qty 0.4

## 2022-12-03 MED ORDER — VANCOMYCIN HCL 1000 MG IV SOLR
INTRAVENOUS | Status: AC
  Filled 2022-12-03: qty 20

## 2022-12-03 MED ORDER — ONDANSETRON HCL 4 MG/2ML IJ SOLN: 4.0000 mg | Freq: Once | INTRAMUSCULAR | Status: DC | PRN

## 2022-12-03 MED ORDER — PROPOFOL 500 MG/50ML IV EMUL
INTRAVENOUS | Status: DC | PRN
  Administered 2022-12-03: 20 ug via INTRAVENOUS
  Administered 2022-12-03: 30 ug/kg/min via INTRAVENOUS
  Administered 2022-12-03: 20 ug via INTRAVENOUS

## 2022-12-03 SURGICAL SUPPLY — 67 items
BAG COUNTER SPONGE SURGICOUNT (BAG) ×1 IMPLANT
BAG DECANTER FOR FLEXI CONT (MISCELLANEOUS) ×1 IMPLANT
BALL HIP ARTICU 28 +5 (Hips) IMPLANT
BIPOLAR PROS AML 52 (Hips) ×1 IMPLANT
BLADE SAG 18X100X1.27 (BLADE) ×1 IMPLANT
CATH FOLEY 2WAY 5CC 16FR (CATHETERS) ×1
CATH URTH STD 16FR FL 2W DRN (CATHETERS) IMPLANT
COOLER ICEMAN CLASSIC (MISCELLANEOUS) IMPLANT
COVER PERINEAL POST (MISCELLANEOUS) ×1 IMPLANT
COVER SURGICAL LIGHT HANDLE (MISCELLANEOUS) ×1 IMPLANT
DERMABOND ADVANCED .7 DNX12 (GAUZE/BANDAGES/DRESSINGS) IMPLANT
DRAPE C-ARM 42X72 X-RAY (DRAPES) ×1 IMPLANT
DRAPE POUCH INSTRU U-SHP 10X18 (DRAPES) ×1 IMPLANT
DRAPE STERI IOBAN 125X83 (DRAPES) ×1 IMPLANT
DRAPE U-SHAPE 47X51 STRL (DRAPES) ×2 IMPLANT
DRSG AQUACEL AG ADV 3.5X10 (GAUZE/BANDAGES/DRESSINGS) ×1 IMPLANT
DURAPREP 26ML APPLICATOR (WOUND CARE) ×2 IMPLANT
ELECT BLADE 4.0 EZ CLEAN MEGAD (MISCELLANEOUS) ×1 IMPLANT
ELECT REM PT RETURN 9FT ADLT (ELECTROSURGICAL) ×1 IMPLANT
ELECTRODE BLDE 4.0 EZ CLN MEGD (MISCELLANEOUS) ×1 IMPLANT
ELECTRODE REM PT RTRN 9FT ADLT (ELECTROSURGICAL) ×1 IMPLANT
GLOVE BIOGEL PI IND STRL 7.0 (GLOVE) ×2 IMPLANT
GLOVE BIOGEL PI IND STRL 7.5 (GLOVE) ×5 IMPLANT
GLOVE ECLIPSE 7.0 STRL STRAW (GLOVE) ×2 IMPLANT
GLOVE SKINSENSE STRL SZ7.5 (GLOVE) ×1 IMPLANT
GLOVE SURG SYN 7.5  E (GLOVE) ×2
GLOVE SURG SYN 7.5 E (GLOVE) ×2 IMPLANT
GLOVE SURG SYN 7.5 PF PI (GLOVE) ×2 IMPLANT
GLOVE SURG UNDER POLY LF SZ7 (GLOVE) ×3 IMPLANT
GLOVE SURG UNDER POLY LF SZ7.5 (GLOVE) ×2 IMPLANT
GOWN STRL REIN XL XLG (GOWN DISPOSABLE) ×1 IMPLANT
GOWN STRL REUS W/ TWL LRG LVL3 (GOWN DISPOSABLE) IMPLANT
GOWN STRL REUS W/ TWL XL LVL3 (GOWN DISPOSABLE) ×1 IMPLANT
GOWN STRL REUS W/TWL LRG LVL3 (GOWN DISPOSABLE)
GOWN STRL REUS W/TWL XL LVL3 (GOWN DISPOSABLE) ×2
GOWN TOGA ZIPPER T7+ PEEL AWAY (MISCELLANEOUS) ×2 IMPLANT
HANDPIECE INTERPULSE COAX TIP (DISPOSABLE) ×1
HEAD BIPOLAR PROS AML 52 (Hips) IMPLANT
HIP BALL ARTICU 28 +5 (Hips) ×1 IMPLANT
HOOD PEEL AWAY T7 (MISCELLANEOUS) ×1 IMPLANT
IV NS IRRIG 3000ML ARTHROMATIC (IV SOLUTION) ×1 IMPLANT
KIT BASIN OR (CUSTOM PROCEDURE TRAY) ×1 IMPLANT
MARKER SKIN DUAL TIP RULER LAB (MISCELLANEOUS) ×1 IMPLANT
NDL SPNL 18GX3.5 QUINCKE PK (NEEDLE) ×1 IMPLANT
NEEDLE SPNL 18GX3.5 QUINCKE PK (NEEDLE) ×1 IMPLANT
PACK TOTAL JOINT (CUSTOM PROCEDURE TRAY) ×1 IMPLANT
PACK UNIVERSAL I (CUSTOM PROCEDURE TRAY) ×1 IMPLANT
PAD ORTHO SHOULDER 7X19 LRG (SOFTGOODS) IMPLANT
SET HNDPC FAN SPRY TIP SCT (DISPOSABLE) ×1 IMPLANT
SOLUTION PRONTOSAN WOUND 350ML (IRRIGATION / IRRIGATOR) ×1 IMPLANT
STAPLER VISISTAT 35W (STAPLE) IMPLANT
STEM FEM ACTIS STD SZ7 (Nail) IMPLANT
SUT ETHIBOND 2 V 37 (SUTURE) ×1 IMPLANT
SUT ETHILON 2 0 FS 18 (SUTURE) IMPLANT
SUT VIC AB 0 CT1 27 (SUTURE) ×1
SUT VIC AB 0 CT1 27XBRD ANBCTR (SUTURE) ×1 IMPLANT
SUT VIC AB 1 CTX 36 (SUTURE) ×1
SUT VIC AB 1 CTX36XBRD ANBCTR (SUTURE) ×1 IMPLANT
SUT VIC AB 2-0 CT1 27 (SUTURE) ×2
SUT VIC AB 2-0 CT1 TAPERPNT 27 (SUTURE) ×2 IMPLANT
SYR 50ML LL SCALE MARK (SYRINGE) ×1 IMPLANT
TOWEL GREEN STERILE (TOWEL DISPOSABLE) ×1 IMPLANT
TRAY CATH 16FR W/PLASTIC CATH (SET/KITS/TRAYS/PACK) IMPLANT
TRAY FOLEY W/BAG SLVR 16FR (SET/KITS/TRAYS/PACK)
TRAY FOLEY W/BAG SLVR 16FR ST (SET/KITS/TRAYS/PACK) IMPLANT
TUBE SUCT ARGYLE STRL (TUBING) ×1 IMPLANT
YANKAUER SUCT BULB TIP NO VENT (SUCTIONS) ×1 IMPLANT

## 2022-12-03 NOTE — Progress Notes (Signed)
PROGRESS NOTE    John [name]  [id number] DOB: 1935-04-11 DOA: 12/01/2022 PCP: Unk Pinto, MD    Brief Narrative:  EVARISTO [name] is a 88 y.o. male with medical history significant for coronary artery disease status two-vessel CABG in 1996, paroxysmal atrial fibrillation, GAD,post due to diabetes mellitus, hypertension, hyperlipidemia, obstructive sleep apnea not on nocturnal CPAP, chronic diastolic heart failure, who is admitted to [facility] on 12/01/2022 with acute left femoral neck fracture after presenting from home to [facility] ED complaining of left hip pain.  OR on 2/9.  PT/OT after   Assessment and Plan:   Acute left femoral neck fracture: confirmed via presenting plain films and stemming from ground level fall  -NPO after midnight -Surgery planned for 2/9   -pain control -PT/OT  presyncope resulting in ground-level fall: An episode of dizziness, lightheadedness, and sensation of impending loss of consciousness that occurred when the patient was attempting to rise from seated to standing position earlier today, resulting in presenting ground-level fall without formal loss of consciousness.  This presentation appears suggestive of orthostatic hypotension, of which the patient has a documented history   -PT/OT     Nodular opacity in the right midlung:  -Presenting chest x-ray shows slight interval increase in prominence of nodular area of scarring in the right midlung relative to plan films of the chest performed in February 2023, which, per radiology, possibly indicates a developing pulmonary nodule.  Radiology recommends CT chest for further correlation, which it appears can be pursued on a nonemergent basis as an outpatient.     Paroxysmal atrial fibrillation: Documented history of such, with documentation of atrial fibrillation/SVT around 2012. In setting of CHA2DS2-VASc score of  7, there is an indication for chronic anticoagulation for thromboembolic  prophylaxis.  While the patient was previously undergoing chronic anticoagulation on Xarelto, this was subsequently discontinued given report of risks versus benefits discussions via outpatient providers in the context of the patient's recurrent orthostatic hypotension and enhanced associated fall risk.  Consequently, the patient is not currently on any blood thinners as an outpatient.  Home AV nodal blocking regimen: None.    Chronic diastolic heart failure: documented history of such, with most recent echocardiogram performed in November 2020, which was notable for grade 1 diastolic dysfunction, and additional details as conveyed above. No clinical or evidence to suggest acutely decompensated heart failure at this time. home diuretic regimen reportedly consists of the following: None.   -monitor      Generalized anxiety disorder:  -resume home meds   type 2 diabetes mellitus:  -Most recent hemoglobin A1c was noted to be 5.3% in June 2021.     -diet controlled     GERD: -resume home meds    Hyperlipidemia:  -resume home meds    Essential Hypertension:  -resume home meds     Benign Prostatic Hyperplasia:   -on tamsulosin as well as finasteride as outpatient.  -reorder   CKD stage 3a -monitor with labs      DVT prophylaxis: SCDs Start: 12/01/22 2043    Code Status: DNR   Disposition Plan:  Level of care: Med-Surg Status is: Inpatient Remains inpatient appropriate because: needs surgery    Consultants:  ortho   Subjective: Still with pain  Objective: Vitals:   12/02/22 1848 12/02/22 2018 12/03/22 0518 12/03/22 0747  BP:  (!) 127/57 119/63 123/68  Pulse:  87 95 85  Resp:  12 17 20  $ Temp:  98.3 F (36.8 C) 98.4  F (36.9 C) 98.8 F (37.1 C)  TempSrc:  Oral Oral Oral  SpO2: 90% 93% 95% 96%  Weight:      Height:        Intake/Output Summary (Last 24 hours) at 12/03/2022 1053 Last data filed at 12/03/2022 0037 Gross per 24 hour  Intake --  Output 1000 ml   Net -1000 ml   Filed Weights   12/01/22 1939  Weight: 83 kg    Examination:   General: Appearance:    Well developed, well nourished male in no acute distress     Lungs:      respirations unlabored  Heart:    Normal heart rate.   MS:   All extremities are intact.   Neurologic:   Awake, alert       Data Reviewed: I have personally reviewed following labs and imaging studies  CBC: Recent Labs  Lab 12/01/22 1757 12/01/22 1807 12/02/22 0240 12/03/22 0307  WBC 10.3  --  9.3 8.6  NEUTROABS  --   --  7.5  --   HGB 14.3 14.3 12.6* 12.2*  HCT 41.6 42.0 36.9* 35.4*  MCV 97.4  --  97.4 98.9  PLT 202  --  191 [id number]*   Basic Metabolic Panel: Recent Labs  Lab 12/01/22 1757 12/01/22 1807 12/01/22 2311 12/02/22 0240 12/03/22 0307  NA 141 142  --  138 138  K 3.9 3.9  --  4.1 3.8  CL 104 106  --  105 104  CO2 22  --   --  20* 23  GLUCOSE 96 93  --  144* 119*  BUN 19 20  --  20 21  CREATININE 1.43* 1.40*  --  1.48* 1.21  CALCIUM 9.1  --   --  8.5* 8.1*  MG  --   --  1.7 1.7  --    GFR: Estimated Creatinine Clearance: 49 mL/min (by C-G formula based on SCr of 1.21 mg/dL). Liver Function Tests: Recent Labs  Lab 12/01/22 1757 12/02/22 0240  AST 18 23  ALT 12 16  ALKPHOS 63 56  BILITOT 0.6 1.2  PROT 6.7 6.0*  ALBUMIN 3.9 3.3*   No results for input(s): "LIPASE", "AMYLASE" in the last 168 hours. No results for input(s): "AMMONIA" in the last 168 hours. Coagulation Profile: Recent Labs  Lab 12/01/22 2311  INR 1.2   Cardiac Enzymes: No results for input(s): "CKTOTAL", "CKMB", "CKMBINDEX", "TROPONINI" in the last 168 hours. BNP (last 3 results) No results for input(s): "PROBNP" in the last 8760 hours. HbA1C: Recent Labs    12/02/22 0240  HGBA1C 5.4   CBG: No results for input(s): "GLUCAP" in the last 168 hours. Lipid Profile: No results for input(s): "CHOL", "HDL", "LDLCALC", "TRIG", "CHOLHDL", "LDLDIRECT" in the last 72 hours. Thyroid Function  Tests: No results for input(s): "TSH", "T4TOTAL", "FREET4", "T3FREE", "THYROIDAB" in the last 72 hours. Anemia Panel: No results for input(s): "VITAMINB12", "FOLATE", "FERRITIN", "TIBC", "IRON", "RETICCTPCT" in the last 72 hours. Sepsis Labs: Recent Labs  Lab 12/01/22 1757 12/02/22 0240  LATICACIDVEN 2.1* 1.5    Recent Results (from the past 240 hour(s))  MRSA Next Gen by PCR, Nasal     Status: None   Collection Time: 12/02/22  2:39 PM   Specimen: Nasal Mucosa; Nasal Swab  Result Value Ref Range Status   MRSA by PCR Next Gen NOT DETECTED NOT DETECTED Final    Comment: (NOTE) The GeneXpert MRSA Assay (FDA approved for NASAL specimens only), is one component  of a comprehensive MRSA colonization surveillance program. It is not intended to diagnose MRSA infection nor to guide or monitor treatment for MRSA infections. Test performance is not FDA approved in patients less than 18 years old. Performed at [facility], [place] [address], [place], [place] [address]          Radiology Studies: CT THORACIC SPINE WO CONTRAST  Result Date: 12/01/2022 CLINICAL DATA:  Fall.  Back pain. EXAM: CT THORACIC SPINE WITHOUT CONTRAST TECHNIQUE: Multidetector CT images of the thoracic were obtained using the standard protocol without intravenous contrast. RADIATION DOSE REDUCTION: This exam was performed according to the departmental dose-optimization program which includes automated exposure control, adjustment of the mA and/or kV according to patient size and/or use of iterative reconstruction technique. COMPARISON:  Chest radiograph dated 12/01/2022 and 11/25/2021. FINDINGS: Evaluation of this exam is limited due to motion artifact. Alignment: No acute subluxation. Vertebrae: No acute fracture. Paraspinal and other soft tissues: No acute finding. No perispinal fluid collection or hematoma. There are areas of scarring in the lungs bilaterally. Evaluation of the lungs is limited due to respiratory  motion. There is a moderate size hiatal hernia. There is coronary vascular calcification. Disc levels: No acute findings.  Degenerative changes. IMPRESSION: No acute/traumatic thoracic spine pathology. Electronically Signed   By: Anner Crete M.D.   On: 12/01/2022 19:34   DG Knee Left Port  Result Date: 12/01/2022 CLINICAL DATA:  Left hip fracture, left thigh pain, level 2 trauma, fall, left hip pain EXAM: PORTABLE LEFT KNEE - 1-2 VIEW COMPARISON:  None 2520 FINDINGS: Degenerative changes in the left knee with medial compartment narrowing and small osteophyte formation. No evidence of acute fracture or dislocation. No focal bone lesion or bone destruction. No significant effusions. Vascular calcifications in the soft tissues. Degenerative changes have progressed since prior study. IMPRESSION: Mild progressing degenerative changes in the left knee. No acute displaced fractures are identified. Electronically Signed   By: Lucienne Capers M.D.   On: 12/01/2022 19:34   DG Hip Unilat W or Wo Pelvis 2-3 Views Left  Result Date: 12/01/2022 CLINICAL DATA:  Trauma.  Level 2 trauma.  Fall.  Left hip pain. EXAM: DG HIP (WITH OR WITHOUT PELVIS) 2-3V LEFT COMPARISON:  None Available. FINDINGS: Transverse fracture of the left femoral neck with varus angulation of the fracture fragments. No evidence of involvement of the inter trochanteric region. Visualized pelvis appears intact. Visualized sacrum appears intact. SI joints and symphysis pubis are not displaced. IMPRESSION: Transverse fracture left femoral neck with varus angulation of fracture fragments. Electronically Signed   By: Lucienne Capers M.D.   On: 12/01/2022 19:32   CT Lumbar Spine Wo Contrast  Result Date: 12/01/2022 CLINICAL DATA:  [place], trauma EXAM: CT LUMBAR SPINE WITHOUT CONTRAST TECHNIQUE: Multidetector CT imaging of the lumbar spine was performed without intravenous contrast administration. Multiplanar CT image reconstructions were also generated.  RADIATION DOSE REDUCTION: This exam was performed according to the departmental dose-optimization program which includes automated exposure control, adjustment of the mA and/or kV according to patient size and/or use of iterative reconstruction technique. COMPARISON:  06/25/2015 FINDINGS: Segmentation: 5 lumbar type vertebrae. Alignment: Mild right convex scoliosis centered at L3. Otherwise alignment is anatomic. Vertebrae: There is mild invagination of the superior endplate of the L1 vertebral body, consistent with age-indeterminate fracture. Would favor chronic change given the lack of visible fracture line or associated paraspinal soft tissue swelling. No other acute lumbar spine fractures. Unilateral right pars defect at L5  with no evidence of spondylolisthesis. 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 Paraspinal and other soft tissues: Paraspinal soft tissues are unremarkable. Atherosclerosis within the aorta and its distal branches. Disc levels: Disc space height is relatively well preserved. Mild broad-based disc bulge at L2-3 and L3-4 without significant compressive sequela. Mild broad-based disc bulge at the L5-S1 level results in minimal symmetrical neural foraminal encroachment. IMPRESSION: 1. Slight invagination superior endplate L1 vertebral body, likely chronic. No other signs of acute lumbar spine fracture. 2. Mild spondylosis. 3. 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 Electronically Signed   By: Randa Ngo M.D.   On: 12/01/2022 19:32   DG Chest Port 1 View  Result Date: 12/01/2022 CLINICAL DATA:  Trauma.  Level 2 trauma.  Fall.  Left hip pain. EXAM: PORTABLE CHEST 1 VIEW COMPARISON:  11/25/2021 FINDINGS: Postoperative changes in the mediastinum. Heart size and pulmonary vascularity are normal. Nodular area of scarring suggested in the right mid lung is more prominent than on prior study, possibly indicating a  developing pulmonary nodule. CT correlation is suggested. No airspace disease or consolidation otherwise identified. No pleural effusions. No pneumothorax. Mediastinal contours appear intact. Calcified and tortuous aorta. Esophageal hiatal hernia behind the heart. Degenerative changes in the spine and shoulders. Postoperative changes in the cervical spine. IMPRESSION: 1. Developing nodular opacity in the right mid lung. CT suggested for further evaluation. 2. Otherwise, no evidence of active pulmonary disease. Electronically Signed   By: Lucienne Capers M.D.   On: 12/01/2022 19:32   CT CERVICAL SPINE WO CONTRAST  Result Date: 12/01/2022 CLINICAL DATA:  Poly trauma, blunt.  Level 2 fall on thinners EXAM: CT CERVICAL SPINE WITHOUT CONTRAST TECHNIQUE: Multidetector CT imaging of the cervical spine was performed without intravenous contrast. Multiplanar CT image reconstructions were also generated. RADIATION DOSE REDUCTION: This exam was performed according to the departmental dose-optimization program which includes automated exposure control, adjustment of the mA and/or kV according to patient size and/or use of iterative reconstruction technique. COMPARISON:  01/15/2021 FINDINGS: Alignment: Normal alignment Skull base and vertebrae: No acute fracture. No primary bone lesion or focal pathologic process. Soft tissues and spinal canal: No prevertebral fluid or swelling. No visible canal hematoma. Disc levels: Postoperative changes with anterior plate and screw fixation and intervertebral fusion at C5 through C7. Degenerative changes with disc space narrowing and endplate osteophyte formation most prominent at C4-5 and C7-T1. Degenerative changes in the facet joints. Upper chest: Lung apices are clear. Other: Postoperative changes in the mediastinum with sternotomy wires visible. IMPRESSION: Normal alignment of the cervical spine. No acute displaced fractures identified. Degenerative and postoperative changes as  discussed. Electronically Signed   By: Lucienne Capers M.D.   On: 12/01/2022 19:30   CT HEAD WO CONTRAST  Result Date: 12/01/2022 CLINICAL DATA:  Head trauma, moderate to severe. Level 2 fall on thinners. Poly trauma, blunt EXAM: CT HEAD WITHOUT CONTRAST TECHNIQUE: Contiguous axial images were obtained from the base of the skull through the vertex without intravenous contrast. RADIATION DOSE REDUCTION: This exam was performed according to the departmental dose-optimization program which includes automated exposure control, adjustment of the mA and/or kV according to patient size and/or use of iterative reconstruction technique. COMPARISON:  11/25/2021 FINDINGS: Brain: Diffuse cerebral atrophy. Ventricular dilatation consistent with central atrophy. Low-attenuation changes in the deep white matter consistent with small vessel ischemia. No abnormal extra-axial fluid collections. No mass effect or midline shift. Gray-white matter junctions  are distinct. Basal cisterns are not effaced. No acute intracranial hemorrhage. Old encephalomalacia in the right anterior frontal lobe is unchanged since prior study, likely old infarct. Vascular: Intracranial arterial calcifications. Skull: Normal. Negative for fracture or focal lesion. Sinuses/Orbits: No acute finding. Other: None. IMPRESSION: No acute intracranial abnormalities. Chronic atrophy and small vessel ischemic changes. Electronically Signed   By: Lucienne Capers M.D.   On: 12/01/2022 19:27        Scheduled Meds:  escitalopram  10 mg Oral Daily   midodrine  5 mg Oral BID WC   pantoprazole (PROTONIX) IV  40 mg Intravenous Q24H   povidone-iodine  2 Application Topical Once   QUEtiapine  100 mg Oral QHS   tamsulosin  0.4 mg Oral Daily   Continuous Infusions:   ceFAZolin (ANCEF) IV     tranexamic acid       LOS: 2 days    Time spent: 45 minutes spent on chart review, discussion with nursing staff, consultants, updating family and interview/physical  exam; more than 50% of that time was spent in counseling and/or coordination of care.    Geradine Girt, DO [facility] Available via Epic secure chat 7am-7pm After these hours, please refer to coverage provider listed on [email or web address] 12/03/2022, 10:53 AM

## 2022-12-03 NOTE — Op Note (Signed)
LEFT TOTAL HIP ARTHROPLASTY ANTERIOR APPROACH  Procedure Note John [name]   [id number]  Pre-op Diagnosis: Left displaced femoral neck fracture     Post-op Diagnosis: same   Operative Procedures  1. Prosthetic replacement for femoral neck fracture. CPT [phone]  Operative Findings 1. Acute transcervical femoral neck fracture  Personnel  Surgeon(s): Leandrew Koyanagi, MD  ASSIST: Madalyn Rob, PA-C   Anesthesia: spinal  Prosthesis: Depuy Femur: Actis 7 STD Head: 52 mm size: +5 Bearing Type: bipolar  Hip Hemiarthroplasty (Anterior Approach) Op Note:  After informed consent was obtained and the operative extremity marked in the holding area, the patient was brought back to the operating room and placed supine on the HANA table. Next, the operative extremity was prepped and draped in normal sterile fashion. Surgical timeout occurred verifying patient identification, surgical site, surgical procedure and administration of antibiotics.  A Hueter approach to the hip was performed, using the interval between tensor fascia lata and sartorius.  Dissection was carried bluntly down onto the anterior hip capsule. The lateral femoral circumflex vessels were identified and coagulated. A capsulotomy was performed and fracture hematoma was evacuated and the capsular flaps tagged for later repair.  The neck osteotomy was performed below the fracture. The femoral head was removed and found a 52 mm head was the appropriate fit.    We then turned our attention to the femur.  After placing the femoral hook, the leg was taken to externally rotated, extended and adducted position taking care to perform soft tissue releases to allow for adequate mobilization of the femur. Soft tissue was cleared from the shoulder of the greater trochanter and the hook elevator used to improve exposure of the proximal femur. Sequential broaching performed up to a size 7. Trial neck and head were placed. The leg was  brought back up to neutral and the construct reduced. The position and sizing of components, offset and leg lengths were checked using fluoroscopy. Stability of the construct was checked in extension and external rotation without any subluxation or impingement of prosthesis. We dislocated the prosthesis, dropped the leg back into position, removed trial components, and irrigated copiously. The final stem and head was then placed, the leg brought back up, the system reduced and fluoroscopy used to verify positioning.  We irrigated, obtained hemostasis and closed the capsule using #2 ethibond suture.  The fascia was closed with #1 vicryl plus, the deep fat layer was closed with 0 vicryl, the subcutaneous layers closed with 2.0 Vicryl Plus and the skin closed with 2.0 nylon. A sterile dressing was applied. The patient was awakened in the operating room and taken to recovery in stable condition. All sponge, needle, and instrument counts were correct at the end of the case.   [name], my PA, was necessary for opening, closing, exposing, retracting, limb positioning and overall facilitation and completion of the surgery.  Position: supine  Complications: see description of procedure.  Time Out: performed   Drains/Packing: none  Estimated blood loss: see anesthesia record  Returned to Recovery Room: in good condition.   Antibiotics: yes   Mechanical VTE (DVT) Prophylaxis: sequential compression devices, TED thigh-high  Chemical VTE (DVT) Prophylaxis: lovenox  Fluid Replacement: Crystalloid: see anesthesia record  Specimens Removed: 1 to pathology   Sponge and Instrument Count Correct? yes   PACU: portable radiograph - low AP   Admission: inpatient status, start PT & OT POD#1  Plan/RTC: Return in 2 weeks for staple removal. Return in 6  weeks to see MD.  Weight Bearing/Load Lower Extremity: full  Hip precautions: none  N. Eduard Roux, MD [facility] 2:44 PM   Implant  Name Type Inv. Item Serial No. Manufacturer Lot No. LRB No. Used Action  STEM FEM ACTIS STD SZ7 - [id number] Nail STEM FEM ACTIS STD SZ7  [employer] [id number] Left 1 Implanted  HIP BALL ARTICU 28 +5 - [id number] Hips HIP BALL ARTICU 28 +5  [employer] [id number] Left 1 Implanted  BIPOLAR PROS AML 52 - [id number] Hips BIPOLAR PROS AML 52  [employer] [id number] Left 1 Implanted

## 2022-12-03 NOTE — Anesthesia Preprocedure Evaluation (Addendum)
Anesthesia Evaluation  Patient identified by MRN, date of birth, ID band Patient awake    Reviewed: Allergy & Precautions, NPO status , Patient's Chart, lab work & pertinent test results  Airway Mallampati: II  TM Distance: >3 FB Neck ROM: Full    Dental  (+) Dental Advisory Given, Caps, Implants   Pulmonary asthma , sleep apnea , COPD, former smoker   Pulmonary exam normal breath sounds clear to auscultation       Cardiovascular hypertension, Pt. on medications + angina  + CAD, + CABG (1996) and +CHF  Normal cardiovascular exam+ dysrhythmias Atrial Fibrillation and Supra Ventricular Tachycardia  Rhythm:Regular Rate:Normal     Neuro/Psych  PSYCHIATRIC DISORDERS Anxiety Depression    PD TIA Neuromuscular disease CVA    GI/Hepatic Neg liver ROS, PUD,GERD  Medicated,,  Endo/Other  diabetes    Renal/GU Renal InsufficiencyRenal disease     Musculoskeletal  (+) Arthritis ,  left femoral neck fracture   Abdominal   Peds  Hematology  (+) Blood dyscrasia (Plt 139k), anemia   Anesthesia Other Findings Day of surgery medications reviewed with the patient.  Reproductive/Obstetrics                             Anesthesia Physical Anesthesia Plan  ASA: 3  Anesthesia Plan: Spinal and MAC   Post-op Pain Management: Tylenol PO (pre-op)*   Induction: Intravenous  PONV Risk Score and Plan: 1 and TIVA and Treatment may vary due to age or medical condition  Airway Management Planned: Natural Airway and Simple Face Mask  Additional Equipment:   Intra-op Plan:   Post-operative Plan:   Informed Consent: I have reviewed the patients History and Physical, chart, labs and discussed the procedure including the risks, benefits and alternatives for the proposed anesthesia with the patient or authorized representative who has indicated his/her understanding and acceptance.   Patient has DNR.  Discussed DNR  with patient and Continue DNR.   Dental advisory given  Plan Discussed with: CRNA  Anesthesia Plan Comments:         Anesthesia Quick Evaluation

## 2022-12-03 NOTE — Discharge Instructions (Signed)
    1. Change dressings as needed °2. May shower but keep incisions covered and dry °3. Take lovenox to prevent blood clots °4. Take stool softeners as needed °5. Take pain meds as needed ° °

## 2022-12-03 NOTE — Transfer of Care (Signed)
Immediate Anesthesia Transfer of Care Note  Patient: John [name]  Procedure(s) Performed: LEFT TOTAL HIP ARTHROPLASTY ANTERIOR APPROACH (Left: Hip)  Patient Location: PACU  Anesthesia Type:MAC and Spinal  Level of Consciousness: awake and patient cooperative  Airway & Oxygen Therapy: Patient Spontanous Breathing and Patient connected to face mask oxygen  Post-op Assessment: Report given to RN, Post -op Vital signs reviewed and stable, and Patient moving all extremities  Post vital signs: Reviewed and stable  Last Vitals:  Vitals Value Taken Time  BP 127/90 12/03/22 1518  Temp    Pulse 108 12/03/22 1521  Resp 22 12/03/22 1521  SpO2 93 % 12/03/22 1521  Vitals shown include unvalidated device data.  Last Pain:  Vitals:   12/03/22 1316  TempSrc:   PainSc: 10-Worst pain ever      Patients Stated Pain Goal: 3 ([phone])  Complications: No notable events documented.

## 2022-12-03 NOTE — H&P (Signed)

## 2022-12-04 DIAGNOSIS — S72002A Fracture of unspecified part of neck of left femur, initial encounter for closed fracture: Secondary | ICD-10-CM | POA: Diagnosis not present

## 2022-12-04 LAB — CBC
HCT: 35 % — ABNORMAL LOW (ref 39.0–52.0)
Hemoglobin: 11.7 g/dL — ABNORMAL LOW (ref 13.0–17.0)
MCH: 33.3 pg (ref 26.0–34.0)
MCHC: 33.4 g/dL (ref 30.0–36.0)
MCV: 99.7 fL (ref 80.0–100.0)
Platelets: 136 10*3/uL — ABNORMAL LOW (ref 150–400)
RBC: 3.51 MIL/uL — ABNORMAL LOW (ref 4.22–5.81)
RDW: 13.5 % (ref 11.5–15.5)
WBC: 8.4 10*3/uL (ref 4.0–10.5)
nRBC: 0 % (ref 0.0–0.2)

## 2022-12-04 LAB — BASIC METABOLIC PANEL
Anion gap: 11 (ref 5–15)
BUN: 18 mg/dL (ref 8–23)
CO2: 20 mmol/L — ABNORMAL LOW (ref 22–32)
Calcium: 7.8 mg/dL — ABNORMAL LOW (ref 8.9–10.3)
Chloride: 106 mmol/L (ref 98–111)
Creatinine, Ser: 1.44 mg/dL — ABNORMAL HIGH (ref 0.61–1.24)
GFR, Estimated: 48 mL/min — ABNORMAL LOW (ref >60)
Glucose, Bld: 101 mg/dL — ABNORMAL HIGH (ref 70–99)
Potassium: 3.9 mmol/L (ref 3.5–5.1)
Sodium: 137 mmol/L (ref 135–145)

## 2022-12-04 LAB — GLUCOSE, CAPILLARY: Glucose-Capillary: 108 mg/dL — ABNORMAL HIGH (ref 70–99)

## 2022-12-04 MED ORDER — MELATONIN 3 MG PO TABS
3.0000 mg | ORAL_TABLET | Freq: Once | ORAL | Status: AC
  Administered 2022-12-04: 3 mg via ORAL
  Filled 2022-12-04: qty 1

## 2022-12-04 NOTE — Evaluation (Signed)
Occupational Therapy Evaluation Patient Details Name: John [name] MRN: [id number] DOB: Aug 19, 1935 Today's Date: 12/04/2022   History of Present Illness John [name] is a 88 y.o. male who presented with L hip pain 2/7 after ground level fall. 2/9 L THA. PMHx: coronary artery disease status two-vessel CABG in 1996, paroxysmal atrial fibrillation, GAD,post due to diabetes mellitus, hypertension, hyperlipidemia, obstructive sleep apnea not on nocturnal CPAP, chronic diastolic heart failure   Clinical Impression   Rohn was evaluated s/p the above admission list, he lives at and ADL, ambulates with RW and has assist 3x/wk for BADLs at baseline. Upon evaluation he was limited by LLE pain, unsteady gait,limited insight, decreased activity tolerance and low BP. Overall he needed mod A +2 for bed mobility and min A +2 for transfers and short mobility with RW. After mobilizing pt's BP was 90s/70s; will need to assesses orthostatic BP next session - RN notified. Due tot he deficits listed below, he also requires up to md A for ADLs. OT to continue to follow acutely. Recommend d/c to SNF for continued therapy and safety.      Recommendations for follow up therapy are one component of a multi-disciplinary discharge planning process, led by the attending physician.  Recommendations may be updated based on patient status, additional functional criteria and insurance authorization.   Follow Up Recommendations  Skilled nursing-short term rehab (<3 hours/day)     Assistance Recommended at Discharge Frequent or constant Supervision/Assistance  Patient can return home with the following A lot of help with walking and/or transfers;A lot of help with bathing/dressing/bathroom;Assistance with cooking/housework;Direct supervision/assist for medications management;Direct supervision/assist for financial management;Assist for transportation;Help with stairs or ramp for entrance    Functional Status  Assessment  Patient has had a recent decline in their functional status and demonstrates the ability to make significant improvements in function in a reasonable and predictable amount of time.  Equipment Recommendations  None recommended by OT (defer)    Recommendations for Other Services       Precautions / Restrictions Precautions Precautions: Fall Precaution Comments: watch BP Restrictions Weight Bearing Restrictions: Yes LLE Weight Bearing: Weight bearing as tolerated      Mobility Bed Mobility Overal bed mobility: Needs Assistance Bed Mobility: Supine to Sit     Supine to sit: Mod assist, +2 for physical assistance, +2 for safety/equipment     General bed mobility comments: cues needed    Transfers Overall transfer level: Needs assistance Equipment used: Rolling walker (2 wheels) Transfers: Sit to/from Stand Sit to Stand: Min assist, +2 physical assistance, +2 safety/equipment                  Balance Overall balance assessment: Needs assistance Sitting-balance support: Feet supported Sitting balance-Leahy Scale: Good     Standing balance support: Bilateral upper extremity supported, During functional activity Standing balance-Leahy Scale: Poor                             ADL either performed or assessed with clinical judgement   ADL Overall ADL's : Needs assistance/impaired Eating/Feeding: Independent;Sitting   Grooming: Set up;Sitting   Upper Body Bathing: Set up;Sitting   Lower Body Bathing: Moderate assistance;Sit to/from stand   Upper Body Dressing : Set up;Sitting   Lower Body Dressing: Moderate assistance;Sit to/from stand   Toilet Transfer: Minimal assistance;+2 for physical assistance;+2 for safety/equipment;Rolling walker (2 wheels)   Toileting- Clothing Manipulation and Hygiene: Min guard;Sitting/lateral lean  Functional mobility during ADLs: Minimal assistance;+2 for physical assistance;+2 for  safety/equipment;Rolling walker (2 wheels) General ADL Comments: generalized weakness, LLE, unsteady gait, low BP, limited insight     Vision Baseline Vision/History: 0 No visual deficits Vision Assessment?: No apparent visual deficits     Perception Perception Perception Tested?: No   Praxis Praxis Praxis tested?: Not tested    Pertinent Vitals/Pain Pain Assessment Pain Assessment: 0-10 Pain Score: 2  Pain Location: L hip Pain Descriptors / Indicators: Grimacing, Guarding Pain Intervention(s): Limited activity within patient's tolerance, Monitored during session     Hand Dominance Right   Extremity/Trunk Assessment Upper Extremity Assessment Upper Extremity Assessment: Generalized weakness   Lower Extremity Assessment Lower Extremity Assessment: Defer to PT evaluation   Cervical / Trunk Assessment Cervical / Trunk Assessment: Kyphotic (mild)   Communication Communication Communication: No difficulties   Cognition Arousal/Alertness: Awake/alert Behavior During Therapy: WFL for tasks assessed/performed Overall Cognitive Status: No family/caregiver present to determine baseline cognitive functioning                                 General Comments: Oriented and cog seemingly [facility] for simple tasks. Not formally assessed     General Comments  Pt with lower BP at the end of the session 90s/70s; anticipate orthostatic BP however not formally assessed this date    Exercises     Shoulder Instructions      Home Living Family/patient expects to be discharged to:: Private residence Living Arrangements: Alone   Type of Home: Independent living facility Home Access: Level entry     Home Layout: One level     Bathroom Shower/Tub: Occupational psychologist: Handicapped height     Home Equipment: Conservation officer, nature (2 wheels);Shower seat          Prior Functioning/Environment Prior Level of Function : Needs assist             Mobility  Comments: Normally uses RW for mobility ADLs Comments: Aide assists with bathing/dressing MWF, staff assist with IADLs        OT Problem List: Decreased strength;Decreased range of motion;Decreased activity tolerance;Impaired balance (sitting and/or standing);Decreased safety awareness;Decreased knowledge of use of DME or AE;Decreased knowledge of precautions;Pain      OT Treatment/Interventions: Self-care/ADL training;Therapeutic exercise;DME and/or AE instruction;Therapeutic activities;Balance training;Patient/family education    OT Goals(Current goals can be found in the care plan section) Acute Rehab OT Goals Patient Stated Goal: did not state OT Goal Formulation: With patient Time For Goal Achievement: 12/18/22 Potential to Achieve Goals: Good ADL Goals Pt Will Perform Grooming: with supervision;standing Pt Will Perform Lower Body Dressing: with min assist;sit to/from stand Pt Will Transfer to Toilet: with min guard assist;ambulating Additional ADL Goal #1: Pt will complete bed mobility with supervision A as a precursor to ADLs  OT Frequency: Min 2X/week    Co-evaluation              AM-PAC OT "6 Clicks" Daily Activity     Outcome Measure Help from another person eating meals?: None Help from another person taking care of personal grooming?: A Little Help from another person toileting, which includes using toliet, bedpan, or urinal?: A Little Help from another person bathing (including washing, rinsing, drying)?: A Lot Help from another person to put on and taking off regular upper body clothing?: A Little Help from another person to put on and taking off regular lower  body clothing?: A Lot 6 Click Score: 17   End of Session Equipment Utilized During Treatment: Gait belt;Rolling walker (2 wheels) Nurse Communication: Mobility status  Activity Tolerance: Patient tolerated treatment well Patient left: in chair;with call bell/phone within reach;with chair alarm set;with  family/visitor present  OT Visit Diagnosis: Unsteadiness on feet (R26.81);Other abnormalities of gait and mobility (R26.89);Muscle weakness (generalized) (M62.81);History of falling (Z91.81);Pain                Time: 1103-1120 OT Time Calculation (min): 17 min Charges:  OT General Charges $OT Visit: 1 Visit OT Evaluation $OT Eval Moderate Complexity: 1 Mod  Shade Flood, OTR/L Acute Rehabilitation Services Office [place] Communication Preferred   Elliot Cousin 12/04/2022, 12:59 PM

## 2022-12-04 NOTE — Progress Notes (Signed)
Patient ID: John [name], male   DOB: 1935-03-05, 88 y.o.   MRN: [id number] Patient is postoperative day 1 left total hip hemi arthroplasty for femoral neck fracture.  Patient has no complaints this morning.  Plan for physical therapy weightbearing as tolerated.

## 2022-12-04 NOTE — Anesthesia Postprocedure Evaluation (Signed)
Anesthesia Post Note  Patient: Janziel Pach [place]  Procedure(s) Performed: LEFT TOTAL HIP ARTHROPLASTY ANTERIOR APPROACH (Left: Hip)     Patient location during evaluation: PACU Anesthesia Type: MAC and Spinal Level of consciousness: awake, awake and alert and oriented Pain management: pain level controlled Vital Signs Assessment: post-procedure vital signs reviewed and stable Respiratory status: spontaneous breathing, nonlabored ventilation and respiratory function stable Cardiovascular status: blood pressure returned to baseline and stable Postop Assessment: no headache, no backache, spinal receding and no apparent nausea or vomiting Anesthetic complications: no   No notable events documented.  Last Vitals:  Vitals:   12/03/22 2021 12/04/22 0519  BP: (!) 146/74 (!) 129/57  Pulse: 100 98  Resp: 17 17  Temp: 37.4 C 36.7 C  SpO2: 91% 90%    Last Pain:  Vitals:   12/04/22 0735  TempSrc:   PainSc: 0-No pain                 Santa Lighter

## 2022-12-04 NOTE — Progress Notes (Signed)
PROGRESS NOTE    John [name]  [id number] DOB: 1935-09-06 DOA: 12/01/2022 PCP: Unk Pinto, MD    Brief Narrative:  John [name] is a 88 y.o. male with medical history significant for coronary artery disease status two-vessel CABG in 1996, paroxysmal atrial fibrillation, GAD,post due to diabetes mellitus, hypertension, hyperlipidemia, obstructive sleep apnea not on nocturnal CPAP, chronic diastolic heart failure, who is admitted to [facility] on 12/01/2022 with acute left femoral neck fracture after presenting from home to [facility] ED complaining of left hip pain.  OR on 2/9.  PT/OT after   Assessment and Plan:   Acute left femoral neck fracture: confirmed via presenting plain films and stemming from ground level fall  -s/p surgery 2/9   -pain control -PT/OT  presyncope resulting in ground-level fall: An episode of dizziness, lightheadedness, and sensation of impending loss of consciousness that occurred when the patient was attempting to rise from seated to standing position earlier today, resulting in presenting ground-level fall without formal loss of consciousness.  This presentation appears suggestive of orthostatic hypotension, of which the patient has a documented history   -PT/OT     Nodular opacity in the right midlung:  -Presenting chest x-ray shows slight interval increase in prominence of nodular area of scarring in the right midlung relative to plan films of the chest performed in February 2023, which, per radiology, possibly indicates a developing pulmonary nodule.  Radiology recommends CT chest for further correlation, which it appears can be pursued on a nonemergent basis as an outpatient.     Paroxysmal atrial fibrillation: Documented history of such, with documentation of atrial fibrillation/SVT around 2012. In setting of CHA2DS2-VASc score of  7, there is an indication for chronic anticoagulation for thromboembolic prophylaxis.  While the patient was  previously undergoing chronic anticoagulation on Xarelto, this was subsequently discontinued given report of risks versus benefits discussions via outpatient providers in the context of the patient's recurrent orthostatic hypotension and enhanced associated fall risk.  Consequently, the patient is not currently on any blood thinners as an outpatient.  Home AV nodal blocking regimen: None.    Chronic diastolic heart failure: documented history of such, with most recent echocardiogram performed in November 2020, which was notable for grade 1 diastolic dysfunction, and additional details as conveyed above. No clinical or evidence to suggest acutely decompensated heart failure at this time. home diuretic regimen reportedly consists of the following: None.   -monitor      Generalized anxiety disorder:  -resume home meds   type 2 diabetes mellitus:  -Most recent hemoglobin A1c was noted to be 5.3% in June 2021.     -diet controlled     GERD: -resume home meds    Hyperlipidemia:  -resume home meds    Essential Hypertension:  -resume home meds     Benign Prostatic Hyperplasia:   -on tamsulosin as well as finasteride as outpatient.  -reorder   CKD stage 3a -monitor with labs      DVT prophylaxis: enoxaparin (LOVENOX) injection 40 mg Start: 12/04/22 1000 SCDs Start: 12/03/22 1629 Place TED hose Start: 12/03/22 1629 SCDs Start: 12/01/22 2043    Code Status: DNR   Disposition Plan:  Level of care: Med-Surg Status is: Inpatient Remains inpatient appropriate because: needs surgery    Consultants:  ortho   Subjective: No SOB, no CP  Objective: Vitals:   12/03/22 1600 12/03/22 1624 12/03/22 2021 12/04/22 0519  BP: (!) 146/73 (!) 148/103 (!) 146/74 (!) 129/57  Pulse:  96 99 100 98  Resp: (!) 27 (!) 21 17 17  $ Temp:  97.9 F (36.6 C) 99.3 F (37.4 C) 98 F (36.7 C)  TempSrc:  Oral Oral Oral  SpO2: 98% 93% 91% 90%  Weight:      Height:        Intake/Output Summary  (Last 24 hours) at 12/04/2022 1113 Last data filed at 12/04/2022 0600 Gross per 24 hour  Intake 300 ml  Output 1150 ml  Net -850 ml   Filed Weights   12/01/22 1939  Weight: 83 kg    Examination:    General: Appearance:    Well developed, well nourished male in no acute distress     Lungs:     respirations unlabored  Heart:    Normal heart rate.   MS:   All extremities are intact.   Neurologic:   Awake, alert       Data Reviewed: I have personally reviewed following labs and imaging studies  CBC: Recent Labs  Lab 12/01/22 1757 12/01/22 1807 12/02/22 0240 12/03/22 0307 12/04/22 0417  WBC 10.3  --  9.3 8.6 8.4  NEUTROABS  --   --  7.5  --   --   HGB 14.3 14.3 12.6* 12.2* 11.7*  HCT 41.6 42.0 36.9* 35.4* 35.0*  MCV 97.4  --  97.4 98.9 99.7  PLT 202  --  191 139* [id number]*   Basic Metabolic Panel: Recent Labs  Lab 12/01/22 1757 12/01/22 1807 12/01/22 2311 12/02/22 0240 12/03/22 0307 12/04/22 0417  NA 141 142  --  138 138 137  K 3.9 3.9  --  4.1 3.8 3.9  CL 104 106  --  105 104 106  CO2 22  --   --  20* 23 20*  GLUCOSE 96 93  --  144* 119* 101*  BUN 19 20  --  20 21 18  $ CREATININE 1.43* 1.40*  --  1.48* 1.21 1.44*  CALCIUM 9.1  --   --  8.5* 8.1* 7.8*  MG  --   --  1.7 1.7  --   --    GFR: Estimated Creatinine Clearance: 41.2 mL/min (A) (by C-G formula based on SCr of 1.44 mg/dL (H)). Liver Function Tests: Recent Labs  Lab 12/01/22 1757 12/02/22 0240  AST 18 23  ALT 12 16  ALKPHOS 63 56  BILITOT 0.6 1.2  PROT 6.7 6.0*  ALBUMIN 3.9 3.3*   No results for input(s): "LIPASE", "AMYLASE" in the last 168 hours. No results for input(s): "AMMONIA" in the last 168 hours. Coagulation Profile: Recent Labs  Lab 12/01/22 2311  INR 1.2   Cardiac Enzymes: No results for input(s): "CKTOTAL", "CKMB", "CKMBINDEX", "TROPONINI" in the last 168 hours. BNP (last 3 results) No results for input(s): "PROBNP" in the last 8760 hours. HbA1C: Recent Labs     12/02/22 0240  HGBA1C 5.4   CBG: Recent Labs  Lab 12/03/22 1240 12/03/22 1521 12/03/22 1738 12/04/22 0735  GLUCAP 107* 122* 119* 108*   Lipid Profile: No results for input(s): "CHOL", "HDL", "LDLCALC", "TRIG", "CHOLHDL", "LDLDIRECT" in the last 72 hours. Thyroid Function Tests: No results for input(s): "TSH", "T4TOTAL", "FREET4", "T3FREE", "THYROIDAB" in the last 72 hours. Anemia Panel: No results for input(s): "VITAMINB12", "FOLATE", "FERRITIN", "TIBC", "IRON", "RETICCTPCT" in the last 72 hours. Sepsis Labs: Recent Labs  Lab 12/01/22 1757 12/02/22 0240  LATICACIDVEN 2.1* 1.5    Recent Results (from the past 240 hour(s))  MRSA Next Gen by PCR, Nasal  Status: None   Collection Time: 12/02/22  2:39 PM   Specimen: Nasal Mucosa; Nasal Swab  Result Value Ref Range Status   MRSA by PCR Next Gen NOT DETECTED NOT DETECTED Final    Comment: (NOTE) The GeneXpert MRSA Assay (FDA approved for NASAL specimens only), is one component of a comprehensive MRSA colonization surveillance program. It is not intended to diagnose MRSA infection nor to guide or monitor treatment for MRSA infections. Test performance is not FDA approved in patients less than 79 years old. Performed at [facility], [place] [address], [place], [place] [address]          Radiology Studies: Pelvis Portable  Result Date: 12/03/2022 CLINICAL DATA:  History of hip replacement, postop. EXAM: PORTABLE PELVIS 1-2 VIEWS COMPARISON:  Preoperative radiograph 12/01/2022 FINDINGS: Left hip arthroplasty in expected alignment. No periprosthetic lucency or fracture. Recent postsurgical change includes air and edema in the soft tissues. IMPRESSION: Left hip arthroplasty without immediate postoperative complication. Electronically Signed   By: Keith Rake M.D.   On: 12/03/2022 15:42   DG HIP UNILAT WITH PELVIS 1V LEFT  Result Date: 12/03/2022 CLINICAL DATA:  Total left hip arthroplasty. EXAM: DG HIP (WITH OR  WITHOUT PELVIS) 1V*L* COMPARISON:  Left hip radiographs 12/01/2022 FINDINGS: Images were performed intraoperatively without the presence of a radiologist. The patient is undergoing left hip arthroplasty. No hardware complication is seen. Total fluoroscopy images: 4 Total fluoroscopy time: 5 seconds Total dose: Radiation Exposure Index (as provided by the fluoroscopic device): 0.612 mGy air Kerma Please see intraoperative findings for further detail. IMPRESSION: Intraoperative fluoroscopic images obtained during left hip arthroplasty. Electronically Signed   By: Yvonne Kendall M.D.   On: 12/03/2022 15:11   DG C-Arm 1-60 Min-No Report  Result Date: 12/03/2022 Fluoroscopy was utilized by the requesting physician.  No radiographic interpretation.   DG C-Arm 1-60 Min-No Report  Result Date: 12/03/2022 Fluoroscopy was utilized by the requesting physician.  No radiographic interpretation.        Scheduled Meds:  acetaminophen  1,000 mg Oral Q6H   docusate sodium  100 mg Oral BID   enoxaparin (LOVENOX) injection  40 mg Subcutaneous Q24H   escitalopram  10 mg Oral Daily   midodrine  5 mg Oral BID WC   pantoprazole (PROTONIX) IV  40 mg Intravenous Q24H   QUEtiapine  100 mg Oral QHS   tamsulosin  0.4 mg Oral Daily   Continuous Infusions:  sodium chloride Stopped (12/04/22 0543)   methocarbamol (ROBAXIN) IV       LOS: 3 days    Time spent: 45 minutes spent on chart review, discussion with nursing staff, consultants, updating family and interview/physical exam; more than 50% of that time was spent in counseling and/or coordination of care.    John Girt, DO [facility] Available via Epic secure chat 7am-7pm After these hours, please refer to coverage provider listed on [email or web address] 12/04/2022, 11:13 AM

## 2022-12-04 NOTE — Evaluation (Signed)
Physical Therapy Evaluation Patient Details Name: John [name] MRN: [id number] DOB: 07/02/1935 Today's Date: 12/04/2022  History of Present Illness  John [name] is a 88 y.o. male who presented with L hip pain 2/7 after ground level fall. 2/9 L THA. PMHx: coronary artery disease status two-vessel CABG in 1996, paroxysmal atrial fibrillation, GAD,post due to diabetes mellitus, hypertension, hyperlipidemia, obstructive sleep apnea not on nocturnal CPAP, chronic diastolic heart failure  Clinical Impression  Pt admitted secondary to problem above with deficits below. Pt requiring min to mod A for short distance gait with RW this session. Questionable cognitive deficits. Pt reports he was asymptomatic, however, BP at 90/71 following gait. Recommend SNF level therapies at d/c to address mobility deficits. Will continue to follow acutely.        Recommendations for follow up therapy are one component of a multi-disciplinary discharge planning process, led by the attending physician.  Recommendations may be updated based on patient status, additional functional criteria and insurance authorization.  Follow Up Recommendations Skilled nursing-short term rehab (<3 hours/day) Can patient physically be transported by private vehicle: No    Assistance Recommended at Discharge Frequent or constant Supervision/Assistance  Patient can return home with the following  A lot of help with walking and/or transfers;A lot of help with bathing/dressing/bathroom;Help with stairs or ramp for entrance;Assistance with cooking/housework;Direct supervision/assist for financial management;Direct supervision/assist for medications management    Equipment Recommendations None recommended by PT  Recommendations for Other Services       Functional Status Assessment Patient has had a recent decline in their functional status and demonstrates the ability to make significant improvements in function in a reasonable and  predictable amount of time.     Precautions / Restrictions Precautions Precautions: Fall Precaution Comments: watch BP Restrictions Weight Bearing Restrictions: Yes LLE Weight Bearing: Weight bearing as tolerated      Mobility  Bed Mobility Overal bed mobility: Needs Assistance Bed Mobility: Supine to Sit     Supine to sit: Min assist, +2 for physical assistance     General bed mobility comments: Required assist for LLE and trunk elevation to come to sitting.    Transfers Overall transfer level: Needs assistance Equipment used: Rolling walker (2 wheels) Transfers: Sit to/from Stand Sit to Stand: Min assist           General transfer comment: Min A for lift assist and steadying.    Ambulation/Gait Ambulation/Gait assistance: Min assist, Mod assist, +2 safety/equipment Gait Distance (Feet): 15 Feet Assistive device: Rolling walker (2 wheels) Gait Pattern/deviations: Step-through pattern, Decreased stride length Gait velocity: Decreased     General Gait Details: Very shaky gait. Initially min A, but progressed to mod A for steadying as gait progressed. Checked BP following gait and BP at 90/71, however, pt asymptomatic.  Stairs            Wheelchair Mobility    Modified Rankin (Stroke Patients Only)       Balance Overall balance assessment: Needs assistance Sitting-balance support: No upper extremity supported Sitting balance-Leahy Scale: Fair     Standing balance support: Bilateral upper extremity supported Standing balance-Leahy Scale: Poor Standing balance comment: Reliant on BUE and external support                             Pertinent Vitals/Pain Pain Assessment Pain Assessment: 0-10 Pain Score: 2  Pain Location: L hip Pain Descriptors / Indicators: Grimacing, Guarding Pain Intervention(s): Limited  activity within patient's tolerance, Monitored during session, Repositioned    Home Living Family/patient expects to be  discharged to:: Private residence Living Arrangements: Alone   Type of Home: [place]: Level entry       [place]: One Welcome: Conservation officer, nature (2 wheels);Shower seat Additional Comments: Pt initially reporting he was in senior living, but after discription in ALF at [facility]    Prior Function Prior Level of Function : Needs assist             Mobility Comments: Normally uses RW for mobility ADLs Comments: Aide assists with bathing/dressing MWF, staff assist with IADLs     Hand Dominance   Dominant Hand: Right    Extremity/Trunk Assessment   Upper Extremity Assessment Upper Extremity Assessment: Generalized weakness    Lower Extremity Assessment Lower Extremity Assessment: Defer to PT evaluation LLE Deficits / Details: Deficits consistent with post op pain and weakness.    Cervical / Trunk Assessment Cervical / Trunk Assessment: Kyphotic (mild)  Communication   Communication: No difficulties  Cognition Arousal/Alertness: Awake/alert Behavior During Therapy: WFL for tasks assessed/performed Overall Cognitive Status: No family/caregiver present to determine baseline cognitive functioning                                 General Comments: Questionable memory deficits. Decreased safety awareness.        General Comments General comments (skin integrity, edema, etc.): Pt with lower BP at the end of the session 90s/70s; anticipate orthostatic BP however not formally assessed this date    Exercises     Assessment/Plan    PT Assessment Patient needs continued PT services  PT Problem List Decreased strength;Decreased activity tolerance;Decreased balance;Decreased range of motion;Decreased mobility;Decreased knowledge of use of DME;Decreased knowledge of precautions;Decreased safety awareness;Pain       PT Treatment Interventions DME instruction;Gait training;Functional mobility training;Therapeutic  activities;Therapeutic exercise;Balance training;Patient/family education    PT Goals (Current goals can be found in the Care Plan section)  Acute Rehab PT Goals Patient Stated Goal: none stated PT Goal Formulation: With patient Time For Goal Achievement: 12/18/22 Potential to Achieve Goals: Good    Frequency Min 3X/week     Co-evaluation PT/OT/SLP Co-Evaluation/Treatment: Yes Reason for Co-Treatment: For patient/therapist safety;Necessary to address cognition/behavior during functional activity;To address functional/ADL transfers PT goals addressed during session: Mobility/safety with mobility;Balance         AM-PAC PT "6 Clicks" Mobility  Outcome Measure Help needed turning from your back to your side while in a flat bed without using bedrails?: A Little Help needed moving from lying on your back to sitting on the side of a flat bed without using bedrails?: A Little Help needed moving to and from a bed to a chair (including a wheelchair)?: A Little Help needed standing up from a chair using your arms (e.g., wheelchair or bedside chair)?: A Little Help needed to walk in hospital room?: A Lot Help needed climbing 3-5 steps with a railing? : Total 6 Click Score: 15    End of Session Equipment Utilized During Treatment: Gait belt Activity Tolerance: Treatment limited secondary to medical complications (Comment) (low BP) Patient left: with call bell/phone within reach;in chair;with chair alarm set Nurse Communication: Mobility status PT Visit Diagnosis: Unsteadiness on feet (R26.81);Muscle weakness (generalized) (M62.81);Difficulty in walking, not elsewhere classified (R26.2);Pain Pain - Right/Left: Left Pain - part of body: Hip  Time: [id number] PT Time Calculation (min) (ACUTE ONLY): 17 min   Charges:   PT Evaluation $PT Eval Moderate Complexity: 1 Mod          Reuel Derby, PT, DPT  Acute Rehabilitation Services  Office: [phone]   Rudean Hitt 12/04/2022, 1:01 PM

## 2022-12-05 DIAGNOSIS — S72002A Fracture of unspecified part of neck of left femur, initial encounter for closed fracture: Secondary | ICD-10-CM | POA: Diagnosis not present

## 2022-12-05 LAB — CBC
HCT: 31.4 % — ABNORMAL LOW (ref 39.0–52.0)
Hemoglobin: 10.9 g/dL — ABNORMAL LOW (ref 13.0–17.0)
MCH: 33.7 pg (ref 26.0–34.0)
MCHC: 34.7 g/dL (ref 30.0–36.0)
MCV: 97.2 fL (ref 80.0–100.0)
Platelets: 140 10*3/uL — ABNORMAL LOW (ref 150–400)
RBC: 3.23 MIL/uL — ABNORMAL LOW (ref 4.22–5.81)
RDW: 13.4 % (ref 11.5–15.5)
WBC: 8 10*3/uL (ref 4.0–10.5)
nRBC: 0 % (ref 0.0–0.2)

## 2022-12-05 MED ORDER — MELATONIN 3 MG PO TABS
3.0000 mg | ORAL_TABLET | Freq: Every day | ORAL | Status: DC
  Administered 2022-12-05: 3 mg via ORAL
  Filled 2022-12-05: qty 1

## 2022-12-05 NOTE — Progress Notes (Signed)
PROGRESS NOTE    John [name]  [id number] DOB: Jul 19, 1935 DOA: 12/01/2022 PCP: Unk Pinto, MD    Brief Narrative:  John [name] is a 88 y.o. male with medical history significant for coronary artery disease status two-vessel CABG in 1996, paroxysmal atrial fibrillation, GAD,post due to diabetes mellitus, hypertension, hyperlipidemia, obstructive sleep apnea not on nocturnal CPAP, chronic diastolic heart failure, who is admitted to [facility] on 12/01/2022 with acute left femoral neck fracture after presenting from home to [facility] ED complaining of left hip pain.  OR on 2/9.  PT/OT after   Assessment and Plan:   Acute left femoral neck fracture: confirmed via presenting plain films and stemming from ground level fall  -s/p surgery 2/9   -pain control -PT/OT-- needs SNF placement  presyncope resulting in ground-level fall: An episode of dizziness, lightheadedness, and sensation of impending loss of consciousness that occurred when the patient was attempting to rise from seated to standing position earlier today, resulting in presenting ground-level fall without formal loss of consciousness.  This presentation appears suggestive of orthostatic hypotension, of which the patient has a documented history   -PT/OT     Nodular opacity in the right midlung:  -Presenting chest x-ray shows slight interval increase in prominence of nodular area of scarring in the right midlung relative to plan films of the chest performed in February 2023, which, per radiology, possibly indicates a developing pulmonary nodule.  Radiology recommends CT chest for further correlation, which it appears can be pursued on a nonemergent basis as an outpatient.     Paroxysmal atrial fibrillation: Documented history of such, with documentation of atrial fibrillation/SVT around 2012. In setting of CHA2DS2-VASc score of  7, there is an indication for chronic anticoagulation for thromboembolic prophylaxis.   While the patient was previously undergoing chronic anticoagulation on Xarelto, this was subsequently discontinued given report of risks versus benefits discussions via outpatient providers in the context of the patient's recurrent orthostatic hypotension and enhanced associated fall risk.  Consequently, the patient is not currently on any blood thinners as an outpatient.  Home AV nodal blocking regimen: None.    Chronic diastolic heart failure: documented history of such, with most recent echocardiogram performed in November 2020, which was notable for grade 1 diastolic dysfunction, and additional details as conveyed above. No clinical or evidence to suggest acutely decompensated heart failure at this time. home diuretic regimen reportedly consists of the following: None.   -monitor      Generalized anxiety disorder:  -resume home meds   type 2 diabetes mellitus:  -Most recent hemoglobin A1c was noted to be 5.3% in June 2021.     -diet controlled     GERD: -resume home meds    Hyperlipidemia:  -resume home meds    Essential Hypertension:  -resume home meds     Benign Prostatic Hyperplasia:   -on tamsulosin as well as finasteride as outpatient.  -reorder   CKD stage 3a -monitor with labs      DVT prophylaxis: enoxaparin (LOVENOX) injection 40 mg Start: 12/04/22 1000 SCDs Start: 12/03/22 1629 Place TED hose Start: 12/03/22 1629 SCDs Start: 12/01/22 2043    Code Status: DNR   Disposition Plan:  Level of care: Med-Surg Status is: Inpatient Remains inpatient appropriate because: needs SNF    Consultants:  ortho   Subjective: Pain control  Objective: Vitals:   12/04/22 1550 12/04/22 2112 12/05/22 0500 12/05/22 0817  BP: 118/69 128/66  133/75  Pulse: 89 91  98  Resp:  16    Temp: 97.8 F (36.6 C) 98.7 F (37.1 C)  98.4 F (36.9 C)  TempSrc: Oral Oral  Oral  SpO2: 98% 97%  97%  Weight:   81.4 kg   Height:        Intake/Output Summary (Last 24 hours) at  12/05/2022 1041 Last data filed at 12/05/2022 [id number] Gross per 24 hour  Intake 240 ml  Output 950 ml  Net -710 ml   Filed Weights   12/01/22 1939 12/05/22 0500  Weight: 83 kg 81.4 kg    Examination:   General: Appearance:    Well developed, well nourished male in no acute distress     Lungs:      respirations unlabored  Heart:    Normal heart rate.    MS:   All extremities are intact.   Neurologic:   Awake, alert       Data Reviewed: I have personally reviewed following labs and imaging studies  CBC: Recent Labs  Lab 12/01/22 1757 12/01/22 1807 12/02/22 0240 12/03/22 0307 12/04/22 0417 12/05/22 0336  WBC 10.3  --  9.3 8.6 8.4 8.0  NEUTROABS  --   --  7.5  --   --   --   HGB 14.3 14.3 12.6* 12.2* 11.7* 10.9*  HCT 41.6 42.0 36.9* 35.4* 35.0* 31.4*  MCV 97.4  --  97.4 98.9 99.7 97.2  PLT 202  --  191 139* 136* [id number]*   Basic Metabolic Panel: Recent Labs  Lab 12/01/22 1757 12/01/22 1807 12/01/22 2311 12/02/22 0240 12/03/22 0307 12/04/22 0417  NA 141 142  --  138 138 137  K 3.9 3.9  --  4.1 3.8 3.9  CL 104 106  --  105 104 106  CO2 22  --   --  20* 23 20*  GLUCOSE 96 93  --  144* 119* 101*  BUN 19 20  --  20 21 18  $ CREATININE 1.43* 1.40*  --  1.48* 1.21 1.44*  CALCIUM 9.1  --   --  8.5* 8.1* 7.8*  MG  --   --  1.7 1.7  --   --    GFR: Estimated Creatinine Clearance: 41.2 mL/min (A) (by C-G formula based on SCr of 1.44 mg/dL (H)). Liver Function Tests: Recent Labs  Lab 12/01/22 1757 12/02/22 0240  AST 18 23  ALT 12 16  ALKPHOS 63 56  BILITOT 0.6 1.2  PROT 6.7 6.0*  ALBUMIN 3.9 3.3*   No results for input(s): "LIPASE", "AMYLASE" in the last 168 hours. No results for input(s): "AMMONIA" in the last 168 hours. Coagulation Profile: Recent Labs  Lab 12/01/22 2311  INR 1.2   Cardiac Enzymes: No results for input(s): "CKTOTAL", "CKMB", "CKMBINDEX", "TROPONINI" in the last 168 hours. BNP (last 3 results) No results for input(s): "PROBNP" in the last  8760 hours. HbA1C: No results for input(s): "HGBA1C" in the last 72 hours.  CBG: Recent Labs  Lab 12/03/22 1240 12/03/22 1521 12/03/22 1738 12/04/22 0735  GLUCAP 107* 122* 119* 108*   Lipid Profile: No results for input(s): "CHOL", "HDL", "LDLCALC", "TRIG", "CHOLHDL", "LDLDIRECT" in the last 72 hours. Thyroid Function Tests: No results for input(s): "TSH", "T4TOTAL", "FREET4", "T3FREE", "THYROIDAB" in the last 72 hours. Anemia Panel: No results for input(s): "VITAMINB12", "FOLATE", "FERRITIN", "TIBC", "IRON", "RETICCTPCT" in the last 72 hours. Sepsis Labs: Recent Labs  Lab 12/01/22 1757 12/02/22 0240  LATICACIDVEN 2.1* 1.5    Recent Results (from the past  240 hour(s))  MRSA Next Gen by PCR, Nasal     Status: None   Collection Time: 12/02/22  2:39 PM   Specimen: Nasal Mucosa; Nasal Swab  Result Value Ref Range Status   MRSA by PCR Next Gen NOT DETECTED NOT DETECTED Final    Comment: (NOTE) The GeneXpert MRSA Assay (FDA approved for NASAL specimens only), is one component of a comprehensive MRSA colonization surveillance program. It is not intended to diagnose MRSA infection nor to guide or monitor treatment for MRSA infections. Test performance is not FDA approved in patients less than 55 years old. Performed at [facility], [place] [address] [place], [place] [address]          Radiology Studies: Pelvis Portable  Result Date: 12/03/2022 CLINICAL DATA:  History of hip replacement, postop. EXAM: PORTABLE PELVIS 1-2 VIEWS COMPARISON:  Preoperative radiograph 12/01/2022 FINDINGS: Left hip arthroplasty in expected alignment. No periprosthetic lucency or fracture. Recent postsurgical change includes air and edema in the soft tissues. IMPRESSION: Left hip arthroplasty without immediate postoperative complication. Electronically Signed   By: Keith Rake M.D.   On: 12/03/2022 15:42   DG HIP UNILAT WITH PELVIS 1V LEFT  Result Date: 12/03/2022 CLINICAL DATA:  Total  left hip arthroplasty. EXAM: DG HIP (WITH OR WITHOUT PELVIS) 1V*L* COMPARISON:  Left hip radiographs 12/01/2022 FINDINGS: Images were performed intraoperatively without the presence of a radiologist. The patient is undergoing left hip arthroplasty. No hardware complication is seen. Total fluoroscopy images: 4 Total fluoroscopy time: 5 seconds Total dose: Radiation Exposure Index (as provided by the fluoroscopic device): 0.612 mGy air Kerma Please see intraoperative findings for further detail. IMPRESSION: Intraoperative fluoroscopic images obtained during left hip arthroplasty. Electronically Signed   By: Yvonne Kendall M.D.   On: 12/03/2022 15:11   DG C-Arm 1-60 Min-No Report  Result Date: 12/03/2022 Fluoroscopy was utilized by the requesting physician.  No radiographic interpretation.   DG C-Arm 1-60 Min-No Report  Result Date: 12/03/2022 Fluoroscopy was utilized by the requesting physician.  No radiographic interpretation.        Scheduled Meds:  docusate sodium  100 mg Oral BID   enoxaparin (LOVENOX) injection  40 mg Subcutaneous Q24H   escitalopram  10 mg Oral Daily   midodrine  5 mg Oral BID WC   pantoprazole (PROTONIX) IV  40 mg Intravenous Q24H   QUEtiapine  100 mg Oral QHS   tamsulosin  0.4 mg Oral Daily   Continuous Infusions:  methocarbamol (ROBAXIN) IV       LOS: 4 days    Time spent: 45 minutes spent on chart review, discussion with nursing staff, consultants, updating family and interview/physical exam; more than 50% of that time was spent in counseling and/or coordination of care.    Geradine Girt, DO [facility] Available via Epic secure chat 7am-7pm After these hours, please refer to coverage provider listed on [email or web address] 12/05/2022, 10:41 AM

## 2022-12-05 NOTE — TOC Initial Note (Signed)
Transition of Care [facility]) - Initial/Assessment Note    Patient Details  Name: John [name] MRN: [id number] Date of Birth: [birth year]  Transition of [facility]) CM/SW Contact:    Geralynn Ochs, LCSW Phone Number: 12/05/2022, 11:31 AM  Clinical Narrative:          CSW spoke with patient's daughter, [name], to discuss recommendation for SNF placement. [name] unsure why recommendation was for SNF as she was there when patient walked around the room and he doesn't appear any different than his baseline. CSW explained recommendation for some additional support and assistance after the fall and surgery, and daughter eventually agreeable to home health at ALF. Patient already has a walker at ALF. CSW spoke with staff at [facility], and they were surprised that patient doesn't need SNF but said patient could return if he wasn't far from baseline. [facility] prefers [facility] or [facility] for home health, but patients can use whoever they prefer for home health if those won't accept. CSW attempting to reach [facility] to discuss referral, awaiting response.         Expected Discharge Plan: Assisted Living Barriers to Discharge: Continued Medical Work up   Patient Goals and CMS Choice Patient states their goals for this hospitalization and ongoing recovery are:: patient unable to participate in goal setting, not fully oriented CMS Medicare.gov Compare Post Acute Care list provided to:: Patient Represenative (must comment) Choice offered to / list presented to : Adult [place] ownership interest in [facility]provided to:: Adult Children    Expected Discharge Plan and Services     Post Acute Care Choice: [place] arrangements for the past 2 months: [place]                                      Prior Living Arrangements/Services Living arrangements for the past 2 months: [place] Lives with:: Facility  Resident Patient language and need for interpreter reviewed:: No Do you feel safe going back to the place where you live?: Yes      Need for Family Participation in Patient Care: Yes (Comment) Care giver support system in place?: Yes (comment) Current home services: DME Criminal Activity/Legal Involvement Pertinent to Current Situation/Hospitalization: No - Comment as needed  Activities of Daily Living Home Assistive Devices/Equipment: Eyeglasses, Gilford Rile (specify type) ADL Screening (condition at time of admission) Patient's cognitive ability adequate to safely complete daily activities?: No Is the patient deaf or have difficulty hearing?: No Does the patient have difficulty seeing, even when wearing glasses/contacts?: No Does the patient have difficulty concentrating, remembering, or making decisions?: Yes Patient able to express need for assistance with ADLs?: Yes Does the patient have difficulty dressing or bathing?: Yes Independently performs ADLs?: No Does the patient have difficulty walking or climbing stairs?: Yes Weakness of Legs: Left Weakness of Arms/Hands: None  Permission Sought/Granted Permission sought to share information with : Facility Sport and exercise psychologist, Family Supports Permission granted to share information with : Yes, Verbal Permission Granted  Share Information with NAME: [name]  Permission granted to share info w AGENCY: [place] granted to share info w Relationship: Daughter     Emotional Assessment   Attitude/Demeanor/Rapport: Unable to Assess Affect (typically observed): Unable to Assess Orientation: : Oriented to Self, Oriented to Place, Oriented to  Time, Oriented to Situation Alcohol / Substance Use: Not Applicable Psych Involvement:  No (comment)  Admission diagnosis:  AKI (acute kidney injury) ([place]) [N17.9] Closed left hip fracture ([place]) [S72.002A] Left displaced femoral neck fracture ([place]) [S72.002A] Fall, initial encounter  [W19.XXXA] Laceration of scalp, initial encounter [S01.01XA] Patient Active Problem List   Diagnosis Date Noted   Left displaced femoral neck fracture ([place]) 12/01/2022   Fall 12/01/2022   Lactic acidosis 12/01/2022   Nodule of middle lobe of right lung 12/01/2022   Paroxysmal atrial fibrillation (HCC) 12/01/2022   Chronic diastolic CHF (congestive heart failure) ([place]) 12/01/2022   GAD (generalized anxiety disorder) 12/01/2022   BPH (benign prostatic hyperplasia) 12/01/2022   COVID-19 virus infection 11/25/2021   History of intracranial hemorrhage 11/25/2021   Near syncope 11/25/2021   CAD (coronary artery disease)    Intracranial bleed ([place]) 01/15/2021   SAH (subarachnoid hemorrhage) ([place])    Non-traumatic rhabdomyolysis    Primary osteoarthritis of left knee 09/26/2019   S/P left knee arthroscopy 09/26/2019   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   Adrenal insufficiency ([place]) 09/25/2019   Acute medial meniscus tear of left knee 08/09/2019   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 anxiety 02/19/2018   Diabetic peripheral neuropathy ([place]) 11/21/2017   BMI 21.0-21.9, adult 09/04/2015   GERD (gastroesophageal reflux disease) 07/09/2015   Autonomic postural hypotension 02/06/2015   CKD stage G3a/A1, GFR 45-59 and albumin creatinine ratio <30 mg/g ([place]) 03/20/2014   Medication management 02/06/2014   Vitamin D deficiency 02/06/2014   Gastric AV malformation 10/30/2012   History of colonic polyps 09/14/2011   DM2 (diabetes mellitus, type 2) ([place]) 03/12/2009   HLD (hyperlipidemia) 03/12/2009   Essential hypertension 03/12/2009   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 ([place]) 03/12/2009   History of TIA (transient ischemic attack) 03/12/2009   Asthma 03/12/2009   COPD (chronic obstructive pulmonary disease) ([place]) 03/12/2009   GERD 03/12/2009   PCP:  Unk Pinto, MD Pharmacy:   [place], [place] - [address] [place] [address] Phone: [phone] Fax: [phone]  [place], [place] - [address] [place] [address] Phone: [phone] Fax: [phone]     Social Determinants of Health (SDOH) Social History: SDOH Screenings   Depression (PHQ2-9): Low Risk  (06/30/2021)  Tobacco Use: Medium Risk (12/03/2022)   SDOH Interventions:     Readmission Risk Interventions     No data to display

## 2022-12-05 NOTE — Plan of Care (Signed)
  Problem: Clinical Measurements: Goal: Will remain free from infection Outcome: Progressing Goal: Diagnostic test results will improve Outcome: Progressing Goal: Cardiovascular complication will be avoided Outcome: Progressing   Problem: Activity: Goal: Risk for activity intolerance will decrease Outcome: Progressing   Problem: Nutrition: Goal: Adequate nutrition will be maintained Outcome: Progressing   Problem: Elimination: Goal: Will not experience complications related to bowel motility Outcome: Progressing Goal: Will not experience complications related to urinary retention Outcome: Progressing   Problem: Pain Managment: Goal: General experience of comfort will improve Outcome: Progressing   Problem: Safety: Goal: Ability to remain free from injury will improve Outcome: Progressing

## 2022-12-06 ENCOUNTER — Encounter (HOSPITAL_COMMUNITY): Payer: Self-pay | Admitting: Orthopaedic Surgery

## 2022-12-06 DIAGNOSIS — S72002A Fracture of unspecified part of neck of left femur, initial encounter for closed fracture: Secondary | ICD-10-CM | POA: Diagnosis not present

## 2022-12-06 MED ORDER — MELATONIN 5 MG PO TABS
5.0000 mg | ORAL_TABLET | Freq: Every day | ORAL | Status: DC
  Administered 2022-12-06 – 2022-12-08 (×3): 5 mg via ORAL
  Filled 2022-12-06 (×3): qty 1

## 2022-12-06 MED ORDER — QUETIAPINE FUMARATE 25 MG PO TABS
50.0000 mg | ORAL_TABLET | Freq: Three times a day (TID) | ORAL | Status: DC
  Administered 2022-12-06 – 2022-12-09 (×9): 50 mg via ORAL
  Filled 2022-12-06 (×9): qty 2

## 2022-12-06 MED ORDER — PANTOPRAZOLE SODIUM 40 MG PO TBEC
40.0000 mg | DELAYED_RELEASE_TABLET | Freq: Every day | ORAL | Status: DC
  Administered 2022-12-06 – 2022-12-08 (×3): 40 mg via ORAL
  Filled 2022-12-06 (×3): qty 1

## 2022-12-06 NOTE — Progress Notes (Signed)
PHARMACIST - PHYSICIAN COMMUNICATION  DR:   Eliseo Squires  CONCERNING: IV to Oral Route Change Policy  RECOMMENDATION: This patient is receiving pantoprazole by the intravenous route.  Based on criteria approved by the Pharmacy and Therapeutics Committee, the intravenous medication(s) is/are being converted to the equivalent oral dose form(s).   DESCRIPTION: These criteria include: The patient is eating (either orally or via tube) and/or has been taking other orally administered medications for a least 24 hours The patient has no evidence of active gastrointestinal bleeding or impaired GI absorption (gastrectomy, short bowel, patient on TNA or NPO).  If you have questions about this conversion, please contact the Pharmacy Department  []$   [phone] )  [facility] []$   [phone] )  [facility] [x]$   [phone] )  [facility] []$   [phone] )  [facility] []$   [phone] )  [place], PharmD, BCPS 12/06/2022 3:07 PM

## 2022-12-06 NOTE — NC FL2 (Signed)
[place] LEVEL OF CARE FORM     IDENTIFICATION  Patient Name: John [name] Birthdate: Jun 22, 1935 Sex: male Admission Date (Current Location): 12/01/2022  [facility] and [place] Number:  Herbalist and Address:  The . [facility], [place] [address] [place], [place] [address]      Provider Number: [id number]  Attending Physician Name and Address:  Geradine Girt, DO  Relative Name and Phone Number:  Raye Sorrow Daughter  [phone]    Current Level of Care: Hospital Recommended Level of Care: [place] Prior Approval Number:    Date Approved/Denied:   PASRR Number: [id number]  Discharge Plan: SNF    Current Diagnoses: Patient Active Problem List   Diagnosis Date Noted   Left displaced femoral neck fracture ([place]) 12/01/2022   Fall 12/01/2022   Lactic acidosis 12/01/2022   Nodule of middle lobe of right lung 12/01/2022   Paroxysmal atrial fibrillation ([place]) 12/01/2022   Chronic diastolic CHF (congestive heart failure) ([place]) 12/01/2022   GAD (generalized anxiety disorder) 12/01/2022   BPH (benign prostatic hyperplasia) 12/01/2022   COVID-19 virus infection 11/25/2021   History of intracranial hemorrhage 11/25/2021   Near syncope 11/25/2021   CAD (coronary artery disease)    Intracranial bleed ([place]) 01/15/2021   SAH (subarachnoid hemorrhage) ([place])    Non-traumatic rhabdomyolysis    Primary osteoarthritis of left knee 09/26/2019   S/P left knee arthroscopy 09/26/2019   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[place]) 09/25/2019   Adrenal insufficiency ([place]) 09/25/2019   Acute medial meniscus tear of left knee 08/09/2019   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 anxiety 02/19/2018   Diabetic peripheral neuropathy ([place]) 11/21/2017   BMI 21.0-21.9, adult 09/04/2015   GERD (gastroesophageal reflux disease) 07/09/2015   Autonomic postural hypotension 02/06/2015   CKD stage G3a/A1, GFR 45-59 and albumin creatinine  ratio <30 mg/g ([place]) 03/20/2014   Medication management 02/06/2014   Vitamin D deficiency 02/06/2014   Gastric AV malformation 10/30/2012   History of colonic polyps 09/14/2011   DM2 (diabetes mellitus, type 2) ([place]) 03/12/2009   HLD (hyperlipidemia) 03/12/2009   Essential hypertension 03/12/2009   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 ([place]) 03/12/2009   History of TIA (transient ischemic attack) 03/12/2009   Asthma 03/12/2009   COPD (chronic obstructive pulmonary disease) ([place]) 03/12/2009   GERD 03/12/2009    Orientation RESPIRATION BLADDER Height & Weight     Self, Time, Situation, Place  O2 Incontinent Weight: 179 lb 7.3 oz (81.4 kg) Height:  6' (182.9 cm)  BEHAVIORAL SYMPTOMS/MOOD NEUROLOGICAL BOWEL NUTRITION STATUS      Continent Diet (see discharge summary)  AMBULATORY STATUS COMMUNICATION OF NEEDS Skin   Limited Assist Verbally Normal, Skin abrasions                       Personal Care Assistance Level of Assistance  Bathing, Feeding, Dressing Bathing Assistance: Limited assistance Feeding assistance: Independent Dressing Assistance: Limited assistance     Functional Limitations Info  Sight, Hearing, Speech Sight Info: Adequate Hearing Info: Adequate Speech Info: Adequate    SPECIAL CARE FACTORS FREQUENCY  PT (By licensed PT), OT (By licensed OT)     PT Frequency: 5x week OT Frequency: 5x week            Contractures Contractures Info: Not present    Additional Factors Info  Code Status, Allergies Code Status Info: DNR Allergies Info: Gabapentin, Prozac (Fluoxetine Hcl), Soma (Carisoprodol),  Beta Adrenergic Blockers           Current Medications (12/06/2022):  This is the current hospital active medication list Current Facility-Administered Medications  Medication Dose Route Frequency Provider Last Rate Last Admin   acetaminophen (TYLENOL) tablet 650 mg  650 mg Oral Q6H PRN Leandrew Koyanagi, MD        Or   acetaminophen (TYLENOL) suppository 650 mg  650 mg Rectal Q6H PRN Leandrew Koyanagi, MD       alum & mag hydroxide-simeth (MAALOX/MYLANTA) 200-200-20 MG/5ML suspension 30 mL  30 mL Oral Q4H PRN Leandrew Koyanagi, MD       docusate sodium (COLACE) capsule 100 mg  100 mg Oral BID Leandrew Koyanagi, MD   100 mg at 12/06/22 0815   enoxaparin (LOVENOX) injection 40 mg  40 mg Subcutaneous Q24H Leandrew Koyanagi, MD   40 mg at 12/06/22 0824   escitalopram (LEXAPRO) tablet 10 mg  10 mg Oral Daily Leandrew Koyanagi, MD   10 mg at 12/06/22 0815   HYDROcodone-acetaminophen (NORCO/VICODIN) 5-325 MG per tablet 1 tablet  1 tablet Oral Q4H PRN Leandrew Koyanagi, MD   1 tablet at 12/05/22 2029   HYDROmorphone (DILAUDID) injection 0.5-1 mg  0.5-1 mg Intravenous Q4H PRN Leandrew Koyanagi, MD       magnesium citrate solution 1 Bottle  1 Bottle Oral Once PRN Leandrew Koyanagi, MD       melatonin tablet 3 mg  3 mg Oral QHS PRN Leandrew Koyanagi, MD   3 mg at 12/05/22 2103   melatonin tablet 3 mg  3 mg Oral QHS Vann, Jessica U, DO   3 mg at 12/05/22 2018   menthol-cetylpyridinium (CEPACOL) lozenge 3 mg  1 lozenge Oral PRN Leandrew Koyanagi, MD       Or   phenol (CHLORASEPTIC) mouth spray 1 spray  1 spray Mouth/Throat PRN Leandrew Koyanagi, MD       methocarbamol (ROBAXIN) tablet 500 mg  500 mg Oral Q6H PRN Leandrew Koyanagi, MD   500 mg at 12/06/22 0815   Or   methocarbamol (ROBAXIN) 500 mg in dextrose 5 % 50 mL IVPB  500 mg Intravenous Q6H PRN Leandrew Koyanagi, MD       naloxone [facility]) injection 0.4 mg  0.4 mg Intravenous PRN Leandrew Koyanagi, MD       ondansetron [facility]) tablet 4 mg  4 mg Oral Q6H PRN Leandrew Koyanagi, MD       Or   ondansetron [facility]) injection 4 mg  4 mg Intravenous Q6H PRN Leandrew Koyanagi, MD       oxyCODONE (Oxy IR/ROXICODONE) immediate release tablet 10-15 mg  10-15 mg Oral Q4H PRN Leandrew Koyanagi, MD       oxyCODONE (Oxy IR/ROXICODONE) immediate release tablet 5-10 mg  5-10 mg Oral Q4H PRN Leandrew Koyanagi, MD   5 mg at 12/06/22 0815    pantoprazole (PROTONIX) injection 40 mg  40 mg Intravenous Q24H Leandrew Koyanagi, MD   40 mg at 12/05/22 2018   polyethylene glycol (MIRALAX / GLYCOLAX) packet 17 g  17 g Oral Daily PRN Leandrew Koyanagi, MD       QUEtiapine (SEROQUEL) tablet 100 mg  100 mg Oral QHS Leandrew Koyanagi, MD   100 mg at 12/05/22 2018   QUEtiapine (SEROQUEL) tablet 50 mg  50 mg Oral TID AC Eulogio Bear U, DO  sorbitol 70 % solution 30 mL  30 mL Oral Daily PRN Leandrew Koyanagi, MD       tamsulosin [facility]) capsule 0.4 mg  0.4 mg Oral Daily Leandrew Koyanagi, MD   0.4 mg at 12/06/22 [id number]     Discharge Medications: Please see discharge summary for a list of discharge medications.  Relevant Imaging Results:  Relevant Lab Results:   Additional Information SS#: [id number]  Joanne Chars, LCSW

## 2022-12-06 NOTE — Progress Notes (Signed)
Physical Therapy Treatment Patient Details Name: John [name] MRN: [id number] DOB: 1935/06/15 Today's Date: 12/06/2022   History of Present Illness John [name] is a 88 y.o. male who presented with L hip pain 2/7 after ground level fall. 2/9 L THA. PMHx: coronary artery disease status two-vessel CABG in 1996, paroxysmal atrial fibrillation, GAD,post due to diabetes mellitus, hypertension, hyperlipidemia, obstructive sleep apnea not on nocturnal CPAP, chronic diastolic heart failure    PT Comments    Pt was received in supine and agreeable to therapy. Pt was able to improve functional mobility this session, requiring up to min guard for bed mobility and mod A to stand from lower surface. Pt needed cues for sequencing and increased time for bed mobility. Pt increased gait distance, however continued to be limited by fatigue with pt sitting quickly without reaching back once EOB. Pt continues to benefit from PT services to progress toward functional mobility goals.    Recommendations for follow up therapy are one component of a multi-disciplinary discharge planning process, led by the attending physician.  Recommendations may be updated based on patient status, additional functional criteria and insurance authorization.  Follow Up Recommendations  Skilled nursing-short term rehab (<3 hours/day) Can patient physically be transported by private vehicle: No   Assistance Recommended at Discharge Frequent or constant Supervision/Assistance  Patient can return home with the following A lot of help with walking and/or transfers;A lot of help with bathing/dressing/bathroom;Help with stairs or ramp for entrance;Assistance with cooking/housework;Direct supervision/assist for financial management;Direct supervision/assist for medications management   Equipment Recommendations  None recommended by PT    Recommendations for Other Services       Precautions / Restrictions  Precautions Precautions: Fall Restrictions Weight Bearing Restrictions: No LLE Weight Bearing: Weight bearing as tolerated     Mobility  Bed Mobility Overal bed mobility: Needs Assistance Bed Mobility: Supine to Sit, Sit to Supine     Supine to sit: Min guard, HOB elevated Sit to supine: Min guard, HOB elevated   General bed mobility comments: Cues for sequence/technique and increased time for LLE due to pain, but pt able to elevate LLE to bed.    Transfers Overall transfer level: Needs assistance Equipment used: Rolling walker (2 wheels) Transfers: Sit to/from Stand Sit to Stand: Mod assist           General transfer comment: Mod A to stand from low surface and cues for safe hand placement    Ambulation/Gait Ambulation/Gait assistance: Min guard Gait Distance (Feet): 25 Feet Assistive device: Rolling walker (2 wheels) Gait Pattern/deviations: Step-through pattern, Decreased stance time - left, Decreased stride length, Decreased step length - left, Decreased step length - right Gait velocity: Decreased     General Gait Details: Pt demonstrating step-through pattern with B decreased step length. Cues for proximity and management of RW.       Balance Overall balance assessment: Needs assistance Sitting-balance support: Feet supported Sitting balance-Leahy Scale: Good Sitting balance - Comments: at EOB   Standing balance support: Bilateral upper extremity supported, During functional activity Standing balance-Leahy Scale: Fair Standing balance comment: With RW support, but able to lift one UE during static stance without unsteadiness                            Cognition Arousal/Alertness: Awake/alert Behavior During Therapy: WFL for tasks assessed/performed Overall Cognitive Status: No family/caregiver present to determine baseline cognitive functioning  General Comments: Not formally assessed         Exercises Total Joint Exercises Ankle Circles/Pumps: AROM, Both, 10 reps, Supine Heel Slides: AROM, Left, Supine, 5 reps    General Comments General comments (skin integrity, edema, etc.): Pt with no adverse s/sx throughout session      Pertinent Vitals/Pain Pain Assessment Pain Assessment: 0-10 Pain Score: 2  Pain Location: L hip Pain Descriptors / Indicators: Grimacing, Guarding Pain Intervention(s): Limited activity within patient's tolerance, Monitored during session, Repositioned     PT Goals (current goals can now be found in the care plan section) Acute Rehab PT Goals Patient Stated Goal: none stated PT Goal Formulation: With patient Time For Goal Achievement: 12/18/22 Potential to Achieve Goals: Good Progress towards PT goals: Progressing toward goals    Frequency    Min 3X/week      PT Plan Current plan remains appropriate       AM-PAC PT "6 Clicks" Mobility   Outcome Measure  Help needed turning from your back to your side while in a flat bed without using bedrails?: A Little Help needed moving from lying on your back to sitting on the side of a flat bed without using bedrails?: A Little Help needed moving to and from a bed to a chair (including a wheelchair)?: A Little Help needed standing up from a chair using your arms (e.g., wheelchair or bedside chair)?: A Little Help needed to walk in hospital room?: A Little Help needed climbing 3-5 steps with a railing? : A Lot 6 Click Score: 17    End of Session Equipment Utilized During Treatment: Gait belt;Oxygen (3.5L) Activity Tolerance: Patient limited by fatigue Patient left: with call bell/phone within reach;in bed;with bed alarm set;with SCD's reapplied Nurse Communication: Mobility status PT Visit Diagnosis: Unsteadiness on feet (R26.81);Muscle weakness (generalized) (M62.81);Difficulty in walking, not elsewhere classified (R26.2);Pain Pain - Right/Left: Left Pain - part of body: Hip     Time:  1450-1506 PT Time Calculation (min) (ACUTE ONLY): 16 min  Charges:  $Gait Training: 8-22 mins                     Michelle Nasuti, PTA Acute Rehabilitation Services Secure Chat Preferred  [phone]    Michelle Nasuti 12/06/2022, 3:28 PM

## 2022-12-06 NOTE — Care Management Important Message (Signed)
Important Message  Patient Details  Name: John [name] MRN: [id number] Date of Birth: September 17, 1935   Medicare Important Message Given:  Yes     Kinslei Labine Montine Circle 12/06/2022, 3:04 PM

## 2022-12-06 NOTE — Progress Notes (Signed)
PROGRESS NOTE    John [name]  [id number] DOB: July 27, 1935 DOA: 12/01/2022 PCP: Unk Pinto, MD    Brief Narrative:  John [name] is a 88 y.o. male with medical history significant for coronary artery disease status two-vessel CABG in 1996, paroxysmal atrial fibrillation, GAD,post due to diabetes mellitus, hypertension, hyperlipidemia, obstructive sleep apnea not on nocturnal CPAP, chronic diastolic heart failure, who is admitted to [facility] on 12/01/2022 with acute left femoral neck fracture after presenting from home to [facility] ED complaining of left hip pain.  OR on 2/9.  PT/OT after.   Assessment and Plan:  Acute left femoral neck fracture: confirmed via presenting plain films and stemming from ground level fall  -s/p surgery 2/9   -pain control -PT/OT-- needs SNF placement  presyncope resulting in ground-level fall: An episode of dizziness, lightheadedness, and sensation of impending loss of consciousness that occurred when the patient was attempting to rise from seated to standing position earlier today, resulting in presenting ground-level fall without formal loss of consciousness.  This presentation appears suggestive of orthostatic hypotension, of which the patient has a documented history   -PT/OT     Nodular opacity in the right midlung:  -Presenting chest x-ray shows slight interval increase in prominence of nodular area of scarring in the right midlung relative to plan films of the chest performed in February 2023, which, per radiology, possibly indicates a developing pulmonary nodule.  Radiology recommends CT chest for further correlation, which it appears can be pursued on a nonemergent basis as an outpatient.     Paroxysmal atrial fibrillation: Documented history of such, with documentation of atrial fibrillation/SVT around 2012. In setting of CHA2DS2-VASc score of  7, there is an indication for chronic anticoagulation for thromboembolic prophylaxis.   While the patient was previously undergoing chronic anticoagulation on Xarelto, this was subsequently discontinued given report of risks versus benefits discussions via outpatient providers in the context of the patient's recurrent orthostatic hypotension and enhanced associated fall risk.  Consequently, the patient is not currently on any blood thinners as an outpatient.  Home AV nodal blocking regimen: None.    Chronic diastolic heart failure: documented history of such, with most recent echocardiogram performed in November 2020, which was notable for grade 1 diastolic dysfunction, and additional details as conveyed above. No clinical or evidence to suggest acutely decompensated heart failure at this time. home diuretic regimen reportedly consists of the following: None.   -monitor      Generalized anxiety disorder:  -resume home meds   type 2 diabetes mellitus:  -Most recent hemoglobin A1c was noted to be 5.3% in June 2021.     -diet controlled     GERD: -resume home meds    Hyperlipidemia:  -resume home meds    Essential Hypertension:  -resume home meds     Benign Prostatic Hyperplasia:   -on tamsulosin as well as finasteride as outpatient.  -reorder   CKD stage 3a -monitor with labs      DVT prophylaxis: enoxaparin (LOVENOX) injection 40 mg Start: 12/04/22 1000 SCDs Start: 12/03/22 1629 Place TED hose Start: 12/03/22 1629 SCDs Start: 12/01/22 2043    Code Status: DNR   Disposition Plan:  Level of care: Med-Surg Status is: Inpatient Remains inpatient appropriate because: needs SNF    Consultants:  ortho   Subjective: Did not sleep well  Objective: Vitals:   12/05/22 0817 12/05/22 2019 12/06/22 0527 12/06/22 0813  BP: 133/75 (!) 156/78 (!) 152/81 132/64  Pulse:  98 91 89 99  Resp: 19 18 15 16  $ Temp: 98.4 F (36.9 C) (!) 97.4 F (36.3 C) 98.2 F (36.8 C) 98 F (36.7 C)  TempSrc: Oral Oral Oral Oral  SpO2: 97% 97% 94% 91%  Weight:      Height:         Intake/Output Summary (Last 24 hours) at 12/06/2022 1203 Last data filed at 12/06/2022 1000 Gross per 24 hour  Intake 240 ml  Output --  Net 240 ml   Filed Weights   12/01/22 1939 12/05/22 0500  Weight: 83 kg 81.4 kg    Examination:   General: Appearance:    Well developed, well nourished male in no acute distress     Lungs:      respirations unlabored  Heart:    Normal heart rate.    MS:   All extremities are intact.   Neurologic:   Awake, alert       Data Reviewed: I have personally reviewed following labs and imaging studies  CBC: Recent Labs  Lab 12/01/22 1757 12/01/22 1807 12/02/22 0240 12/03/22 0307 12/04/22 0417 12/05/22 0336  WBC 10.3  --  9.3 8.6 8.4 8.0  NEUTROABS  --   --  7.5  --   --   --   HGB 14.3 14.3 12.6* 12.2* 11.7* 10.9*  HCT 41.6 42.0 36.9* 35.4* 35.0* 31.4*  MCV 97.4  --  97.4 98.9 99.7 97.2  PLT 202  --  191 139* 136* [id number]*   Basic Metabolic Panel: Recent Labs  Lab 12/01/22 1757 12/01/22 1807 12/01/22 2311 12/02/22 0240 12/03/22 0307 12/04/22 0417  NA 141 142  --  138 138 137  K 3.9 3.9  --  4.1 3.8 3.9  CL 104 106  --  105 104 106  CO2 22  --   --  20* 23 20*  GLUCOSE 96 93  --  144* 119* 101*  BUN 19 20  --  20 21 18  $ CREATININE 1.43* 1.40*  --  1.48* 1.21 1.44*  CALCIUM 9.1  --   --  8.5* 8.1* 7.8*  MG  --   --  1.7 1.7  --   --    GFR: Estimated Creatinine Clearance: 41.2 mL/min (A) (by C-G formula based on SCr of 1.44 mg/dL (H)). Liver Function Tests: Recent Labs  Lab 12/01/22 1757 12/02/22 0240  AST 18 23  ALT 12 16  ALKPHOS 63 56  BILITOT 0.6 1.2  PROT 6.7 6.0*  ALBUMIN 3.9 3.3*   No results for input(s): "LIPASE", "AMYLASE" in the last 168 hours. No results for input(s): "AMMONIA" in the last 168 hours. Coagulation Profile: Recent Labs  Lab 12/01/22 2311  INR 1.2   Cardiac Enzymes: No results for input(s): "CKTOTAL", "CKMB", "CKMBINDEX", "TROPONINI" in the last 168 hours. BNP (last 3 results) No  results for input(s): "PROBNP" in the last 8760 hours. HbA1C: No results for input(s): "HGBA1C" in the last 72 hours.  CBG: Recent Labs  Lab 12/03/22 1240 12/03/22 1521 12/03/22 1738 12/04/22 0735  GLUCAP 107* 122* 119* 108*   Lipid Profile: No results for input(s): "CHOL", "HDL", "LDLCALC", "TRIG", "CHOLHDL", "LDLDIRECT" in the last 72 hours. Thyroid Function Tests: No results for input(s): "TSH", "T4TOTAL", "FREET4", "T3FREE", "THYROIDAB" in the last 72 hours. Anemia Panel: No results for input(s): "VITAMINB12", "FOLATE", "FERRITIN", "TIBC", "IRON", "RETICCTPCT" in the last 72 hours. Sepsis Labs: Recent Labs  Lab 12/01/22 1757 12/02/22 0240  LATICACIDVEN 2.1* 1.5  Recent Results (from the past 240 hour(s))  MRSA Next Gen by PCR, Nasal     Status: None   Collection Time: 12/02/22  2:39 PM   Specimen: Nasal Mucosa; Nasal Swab  Result Value Ref Range Status   MRSA by PCR Next Gen NOT DETECTED NOT DETECTED Final    Comment: (NOTE) The GeneXpert MRSA Assay (FDA approved for NASAL specimens only), is one component of a comprehensive MRSA colonization surveillance program. It is not intended to diagnose MRSA infection nor to guide or monitor treatment for MRSA infections. Test performance is not FDA approved in patients less than 38 years old. Performed at [facility], [place] [address] [place], [place] [address]          Radiology Studies: No results found.      Scheduled Meds:  docusate sodium  100 mg Oral BID   enoxaparin (LOVENOX) injection  40 mg Subcutaneous Q24H   escitalopram  10 mg Oral Daily   melatonin  3 mg Oral QHS   pantoprazole (PROTONIX) IV  40 mg Intravenous Q24H   QUEtiapine  100 mg Oral QHS   QUEtiapine  50 mg Oral TID AC   tamsulosin  0.4 mg Oral Daily   Continuous Infusions:  methocarbamol (ROBAXIN) IV       LOS: 5 days    Time spent: 45 minutes spent on chart review, discussion with nursing staff, consultants, updating  family and interview/physical exam; more than 50% of that time was spent in counseling and/or coordination of care.    Geradine Girt, DO [facility] Available via Epic secure chat 7am-7pm After these hours, please refer to coverage provider listed on [email or web address] 12/06/2022, 12:03 PM

## 2022-12-06 NOTE — TOC Progression Note (Addendum)
Transition of Care [facility]) - Progression Note    Patient Details  Name: John [name] MRN: [id number] Date of Birth: 1935-01-27  Transition of Care [facility]) CM/SW Contact  Joanne Chars, LCSW Phone Number: 12/06/2022, 10:34 AM  Clinical Narrative:  CSW spoke with pt regarding DC plan, pt now saying he wants to pursue SNF rehab.  CSW confirmed with daughter [name], who is also in agreement with SNF as the plan.  She would like CSW to check with [facility].  Referral sent out in hub for SNF.   1140: Bed offers presented to pt along with medicare choice document.  He asked CSW to speak to his daughter about which SNF to chose.  CSW spoke with [name], she does want to accept offer at [facility].  CSW confirmed bed with [facility].  Auth request for SNF and PTAR made with [facility].     Expected Discharge Plan: Assisted Living Barriers to Discharge: Continued Medical Work up  Expected Discharge Plan and Services     Post Acute Care Choice: [facility] arrangements for the past 2 months: [facility]                                       Social Determinants of Health (SDOH) Interventions SDOH Screenings   Depression (PHQ2-9): Low Risk  (06/30/2021)  Tobacco Use: Medium Risk (12/03/2022)    Readmission Risk Interventions     No data to display

## 2022-12-07 ENCOUNTER — Encounter (HOSPITAL_COMMUNITY): Payer: Self-pay | Admitting: Orthopaedic Surgery

## 2022-12-07 ENCOUNTER — Inpatient Hospital Stay (HOSPITAL_COMMUNITY): Payer: PPO

## 2022-12-07 DIAGNOSIS — S72002A Fracture of unspecified part of neck of left femur, initial encounter for closed fracture: Secondary | ICD-10-CM | POA: Diagnosis not present

## 2022-12-07 LAB — CBC
HCT: 32.9 % — ABNORMAL LOW (ref 39.0–52.0)
Hemoglobin: 10.9 g/dL — ABNORMAL LOW (ref 13.0–17.0)
MCH: 32.7 pg (ref 26.0–34.0)
MCHC: 33.1 g/dL (ref 30.0–36.0)
MCV: 98.8 fL (ref 80.0–100.0)
Platelets: 177 10*3/uL (ref 150–400)
RBC: 3.33 MIL/uL — ABNORMAL LOW (ref 4.22–5.81)
RDW: 13.6 % (ref 11.5–15.5)
WBC: 6 10*3/uL (ref 4.0–10.5)
nRBC: 0 % (ref 0.0–0.2)

## 2022-12-07 LAB — BASIC METABOLIC PANEL
Anion gap: 9 (ref 5–15)
BUN: 16 mg/dL (ref 8–23)
CO2: 22 mmol/L (ref 22–32)
Calcium: 7.9 mg/dL — ABNORMAL LOW (ref 8.9–10.3)
Chloride: 108 mmol/L (ref 98–111)
Creatinine, Ser: 1.12 mg/dL (ref 0.61–1.24)
GFR, Estimated: >60 mL/min (ref >60)
Glucose, Bld: 108 mg/dL — ABNORMAL HIGH (ref 70–99)
Potassium: 3.7 mmol/L (ref 3.5–5.1)
Sodium: 139 mmol/L (ref 135–145)

## 2022-12-07 LAB — URINALYSIS, ROUTINE W REFLEX MICROSCOPIC
Bacteria, UA: NONE SEEN
Bilirubin Urine: NEGATIVE
Glucose, UA: NEGATIVE mg/dL
Hgb urine dipstick: NEGATIVE
Ketones, ur: 5 mg/dL — AB
Leukocytes,Ua: NEGATIVE
Nitrite: NEGATIVE
Protein, ur: 30 mg/dL — AB
Specific Gravity, Urine: 1.024 (ref 1.005–1.030)
pH: 5 (ref 5.0–8.0)

## 2022-12-07 MED ORDER — MIDODRINE HCL 5 MG PO TABS
5.0000 mg | ORAL_TABLET | Freq: Two times a day (BID) | ORAL | Status: DC
  Administered 2022-12-07 – 2022-12-09 (×4): 5 mg via ORAL
  Filled 2022-12-07 (×4): qty 1

## 2022-12-07 MED ORDER — BUPIVACAINE IN DEXTROSE 0.75-8.25 % IT SOLN
INTRATHECAL | Status: DC | PRN
  Administered 2022-12-03: 1.6 mL via INTRATHECAL

## 2022-12-07 MED ORDER — POLYETHYLENE GLYCOL 3350 17 G PO PACK
17.0000 g | PACK | Freq: Every day | ORAL | Status: DC
  Administered 2022-12-07: 17 g via ORAL
  Filled 2022-12-07 (×2): qty 1

## 2022-12-07 NOTE — Progress Notes (Signed)
Physical Therapy Treatment Patient Details Name: John [name] MRN: [id number] DOB: 1935/09/12 Today's Date: 12/07/2022   History of Present Illness John [name] is a 88 y.o. male who presented with L hip pain 2/7 after ground level fall. 2/9 L THA. PMHx: coronary artery disease status two-vessel CABG in 1996, paroxysmal atrial fibrillation, GAD,post due to diabetes mellitus, hypertension, hyperlipidemia, obstructive sleep apnea not on nocturnal CPAP, chronic diastolic heart failure    PT Comments    Tolerated treatment well. Increased ambulatory distance. Close guard for safety. Focusing on symmetry of gait, increased foot clearance, and step length. Tolerated LE exercises well. Min assist with bed mobility and transfers. Patient will continue to benefit from skilled physical therapy services to further improve independence with functional mobility.    Recommendations for follow up therapy are one component of a multi-disciplinary discharge planning process, led by the attending physician.  Recommendations may be updated based on patient status, additional functional criteria and insurance authorization.  Follow Up Recommendations  Skilled nursing-short term rehab (<3 hours/day) Can patient physically be transported by private vehicle: Yes   Assistance Recommended at Discharge Frequent or constant Supervision/Assistance  Patient can return home with the following Help with stairs or ramp for entrance;Assistance with cooking/housework;Direct supervision/assist for financial management;Direct supervision/assist for medications management;A little help with walking and/or transfers;A little help with bathing/dressing/bathroom;Assist for transportation   Equipment Recommendations  None recommended by PT    Recommendations for Other Services       Precautions / Restrictions Precautions Precautions: Fall Precaution Comments: watch BP Restrictions Weight Bearing Restrictions:  No LLE Weight Bearing: Weight bearing as tolerated     Mobility  Bed Mobility Overal bed mobility: Needs Assistance Bed Mobility: Supine to Sit     Supine to sit: HOB elevated, Min assist     General bed mobility comments: min assist for LLE support to bring OOB. No assist for trunk. Effortful but able to press up from [facility] elevated position and scoot to EOB.    Transfers Overall transfer level: Needs assistance Equipment used: Rolling walker (2 wheels) Transfers: Sit to/from Stand Sit to Stand: Min assist           General transfer comment: Min assist for boost to stand from low bed setting. Cues for hand placement.    Ambulation/Gait Ambulation/Gait assistance: Min guard Gait Distance (Feet): 65 Feet Assistive device: Rolling walker (2 wheels) Gait Pattern/deviations: Step-through pattern, Decreased stance time - left, Decreased stride length, Decreased step length - left, Decreased step length - right, Shuffle, Festinating, Antalgic Gait velocity: Decreased Gait velocity interpretation: <1.31 ft/sec, indicative of household ambulator   General Gait Details: Gait training with RW. Cues for larger step length, symmetry and foot clearance. Close guard for safety. No buckling present during bout. Near festinating type gait at times.   Stairs             Wheelchair Mobility    Modified Rankin (Stroke Patients Only)       Balance Overall balance assessment: Needs assistance Sitting-balance support: Feet supported Sitting balance-Leahy Scale: Good Sitting balance - Comments: at EOB   Standing balance support: During functional activity, Single extremity supported Standing balance-Leahy Scale: Poor Standing balance comment: Stands with single UE support today                            Cognition Arousal/Alertness: Awake/alert Behavior During Therapy: WFL for tasks assessed/performed Overall Cognitive Status: Within Functional  Limits for tasks  assessed                                          Exercises Total Joint Exercises Ankle Circles/Pumps: AROM, Both, Supine, 15 reps Quad Sets: Strengthening, Both, 15 reps, Seated Gluteal Sets: Strengthening, Both, 15 reps, Seated Towel Squeeze: Strengthening, Both, 15 reps, Seated Heel Slides: AROM, Left, Supine, 5 reps    General Comments        Pertinent Vitals/Pain Pain Assessment Pain Assessment: 0-10 Pain Score: 2  Pain Location: L hip Pain Descriptors / Indicators: Grimacing, Guarding Pain Intervention(s): Monitored during session, Repositioned    Home Living                          Prior Function            PT Goals (current goals can now be found in the care plan section) Acute Rehab PT Goals Patient Stated Goal: none stated PT Goal Formulation: With patient Time For Goal Achievement: 12/18/22 Potential to Achieve Goals: Good Progress towards PT goals: Progressing toward goals    Frequency    Min 3X/week      PT Plan Current plan remains appropriate    Co-evaluation              AM-PAC PT "6 Clicks" Mobility   Outcome Measure  Help needed turning from your back to your side while in a flat bed without using bedrails?: A Little Help needed moving from lying on your back to sitting on the side of a flat bed without using bedrails?: A Little Help needed moving to and from a bed to a chair (including a wheelchair)?: A Little Help needed standing up from a chair using your arms (e.g., wheelchair or bedside chair)?: A Little Help needed to walk in hospital room?: A Little Help needed climbing 3-5 steps with a railing? : A Lot 6 Click Score: 17    End of Session Equipment Utilized During Treatment: Gait belt Activity Tolerance: Patient tolerated treatment well Patient left: with call bell/phone within reach;in chair;with chair alarm set;with SCD's reapplied Nurse Communication: Mobility status PT Visit Diagnosis:  Unsteadiness on feet (R26.81);Muscle weakness (generalized) (M62.81);Difficulty in walking, not elsewhere classified (R26.2);Pain Pain - Right/Left: Left Pain - part of body: Hip     Time: [id number] PT Time Calculation (min) (ACUTE ONLY): 24 min  Charges:  $Gait Training: 8-22 mins $Therapeutic Exercise: 8-22 mins                     Candie Mile, PT, DPT Physical Therapist Acute Rehabilitation Services [place]    Ellouise Newer 12/07/2022, 3:16 PM

## 2022-12-07 NOTE — Plan of Care (Signed)
Pt alert and oriented x 4. Vitals stable. Attempted to wean oxygen. Pt weaned to 3L. Pt pulls oxygen out of nose at times and will desat to mid 80's. Oxygen acute for patient. Pt refused mobility during shift. Offered to mobilize and turn pt refused. Pt given oxy x 1 and dilaudid x 2 this shift. Pt was able to rest after dilaudid administered. New Iv started by IV team this shift. SCD in place for Dvt prevention. Dsg intact to left hip.  Problem: Education: Goal: Knowledge of General Education information will improve Description: Including pain rating scale, medication(s)/side effects and non-pharmacologic comfort measures Outcome: Progressing   Problem: Health Behavior/Discharge Planning: Goal: Ability to manage health-related needs will improve Outcome: Progressing   Problem: Clinical Measurements: Goal: Ability to maintain clinical measurements within normal limits will improve Outcome: Progressing Goal: Will remain free from infection Outcome: Progressing Goal: Diagnostic test results will improve Outcome: Progressing Goal: Respiratory complications will improve Outcome: Progressing Goal: Cardiovascular complication will be avoided Outcome: Progressing   Problem: Activity: Goal: Risk for activity intolerance will decrease Outcome: Progressing   Problem: Nutrition: Goal: Adequate nutrition will be maintained Outcome: Progressing   Problem: Coping: Goal: Level of anxiety will decrease Outcome: Progressing   Problem: Elimination: Goal: Will not experience complications related to bowel motility Outcome: Progressing Goal: Will not experience complications related to urinary retention Outcome: Progressing   Problem: Pain Managment: Goal: General experience of comfort will improve Outcome: Progressing   Problem: Safety: Goal: Ability to remain free from injury will improve Outcome: Progressing   Problem: Skin Integrity: Goal: Risk for impaired skin integrity will  decrease Outcome: Progressing   Problem: Education: Goal: Verbalization of understanding the information provided (i.e., activity precautions, restrictions, etc) will improve Outcome: Progressing Goal: Individualized Educational Video(s) Outcome: Progressing   Problem: Activity: Goal: Ability to ambulate and perform ADLs will improve Outcome: Progressing   Problem: Clinical Measurements: Goal: Postoperative complications will be avoided or minimized Outcome: Progressing   Problem: Self-Concept: Goal: Ability to maintain and perform role responsibilities to the fullest extent possible will improve Outcome: Progressing   Problem: Pain Management: Goal: Pain level will decrease Outcome: Progressing

## 2022-12-07 NOTE — Anesthesia Procedure Notes (Addendum)
Spinal  Patient location during procedure: OR Start time: 12/03/2022 1:35 PM End time: 12/03/2022 1:40 PM Reason for block: surgical anesthesia Staffing Performed: anesthesiologist  Anesthesiologist: Santa Lighter, MD Performed by: Santa Lighter, MD Authorized by: Santa Lighter, MD   Preanesthetic Checklist Completed: patient identified, IV checked, risks and benefits discussed, surgical consent, monitors and equipment checked, pre-op evaluation and timeout performed Spinal Block Patient position: left lateral decubitus Prep: DuraPrep and site prepped and draped Patient monitoring: continuous pulse ox and blood pressure Approach: midline Location: L3-4 Injection technique: single-shot Needle Needle type: Pencan  Needle gauge: 24 G Assessment Sensory level: T6 Events: CSF return Additional Notes Functioning IV was confirmed and monitors were applied. Sterile prep and drape, including hand hygiene, mask and sterile gloves were used. The patient was positioned and the spine was prepped. The skin was anesthetized with lidocaine.  Free flow of clear CSF was obtained prior to injecting local anesthetic into the CSF.  The spinal needle aspirated freely following injection.  The needle was carefully withdrawn.  The patient tolerated the procedure well. Consent was obtained prior to procedure with all questions answered and concerns addressed. Risks including but not limited to bleeding, infection, nerve damage, paralysis, failed block, inadequate analgesia, allergic reaction, high spinal, itching and headache were discussed and the patient wished to proceed.   Hoy Morn, MD

## 2022-12-07 NOTE — Addendum Note (Signed)
Addendum  created 12/07/22 1507 by Santa Lighter, MD   Child order released for a procedure order, Clinical Note Signed, Intraprocedure Blocks edited, Intraprocedure Meds edited, SmartForm saved

## 2022-12-07 NOTE — Progress Notes (Signed)
PROGRESS NOTE    John [name]  [id number] DOB: [birth year] DOA: 12/01/2022 PCP: Unk Pinto, MD    Brief Narrative:  John [name] is a 88 y.o. male with medical history significant for coronary artery disease status two-vessel CABG in 1996, paroxysmal atrial fibrillation, GAD,post due to diabetes mellitus, hypertension, hyperlipidemia, obstructive sleep apnea not on nocturnal CPAP, chronic diastolic heart failure, who is admitted to [facility] on 12/01/2022 with acute left femoral neck fracture after presenting from home to [facility] ED complaining of left hip pain.  OR on 2/9.  PT/OT after.  Developed fever on 2/13 overnight as well as need for O2.  Xray pending.   Assessment and Plan:  Acute left femoral neck fracture: confirmed via presenting plain films and stemming from ground level fall  -s/p surgery 2/9   -pain control -PT/OT-- needs SNF placement  Fever -check U.A and x ray -hold on abx until then -ordered IS AGAIN  presyncope resulting in ground-level fall: An episode of dizziness, lightheadedness, and sensation of impending loss of consciousness that occurred when the patient was attempting to rise from seated to standing position earlier today, resulting in presenting ground-level fall without formal loss of consciousness.  This presentation appears suggestive of orthostatic hypotension, of which the patient has a documented history   -PT/OT     Nodular opacity in the right midlung:  -Presenting chest x-ray shows slight interval increase in prominence of nodular area of scarring in the right midlung relative to plan films of the chest performed in February 2023, which, per radiology, possibly indicates a developing pulmonary nodule.  Radiology recommends CT chest for further correlation, which it appears can be pursued on a nonemergent basis as an outpatient.     Paroxysmal atrial fibrillation: Documented history of such, with documentation of atrial  fibrillation/SVT around 2012. In setting of CHA2DS2-VASc score of  7, there is an indication for chronic anticoagulation for thromboembolic prophylaxis.  While the patient was previously undergoing chronic anticoagulation on Xarelto, this was subsequently discontinued given report of risks versus benefits discussions via outpatient providers in the context of the patient's recurrent orthostatic hypotension and enhanced associated fall risk.  Consequently, the patient is not currently on any blood thinners as an outpatient.  Home AV nodal blocking regimen: None.    Chronic diastolic heart failure: documented history of such, with most recent echocardiogram performed in November 2020, which was notable for grade 1 diastolic dysfunction, and additional details as conveyed above. No clinical or evidence to suggest acutely decompensated heart failure at this time. home diuretic regimen reportedly consists of the following: None.   -monitor      Generalized anxiety disorder:  -resume home meds   type 2 diabetes mellitus:  -Most recent hemoglobin A1c was noted to be 5.3% in June 2021.     -diet controlled     GERD: -resume home meds    Hyperlipidemia:  -resume home meds    Essential Hypertension:  -resume home meds     Benign Prostatic Hyperplasia:   -on tamsulosin as well as finasteride as outpatient.  -reorder   CKD stage 3a -monitor with labs      DVT prophylaxis: enoxaparin (LOVENOX) injection 40 mg Start: 12/04/22 1000 SCDs Start: 12/03/22 1629 Place TED hose Start: 12/03/22 1629 SCDs Start: 12/01/22 2043    Code Status: DNR   Disposition Plan:  Level of care: Med-Surg Status is: Inpatient Remains inpatient appropriate because: needs SNF    Consultants:  ortho  Subjective: Overnight had fever and need for O2  Objective: Vitals:   12/06/22 2054 12/06/22 2357 12/07/22 0500 12/07/22 0547  BP: (!) 158/65 (!) 149/74  126/74  Pulse: 72 94  93  Resp: 16 16  18   $ Temp: 98.8 F (37.1 C) 98.3 F (36.8 C)  97.9 F (36.6 C)  TempSrc:      SpO2: 94% 95%  94%  Weight:   83.7 kg   Height:        Intake/Output Summary (Last 24 hours) at 12/07/2022 0847 Last data filed at 12/07/2022 0500 Gross per 24 hour  Intake 360 ml  Output 430 ml  Net -70 ml   Filed Weights   12/01/22 1939 12/05/22 0500 12/07/22 0500  Weight: 83 kg 81.4 kg 83.7 kg    Examination:   General: Appearance:     Overweight male in no acute distress     Lungs:     On NS, diminished at bases, respirations unlabored  Heart:    Normal heart rate.  MS:   All extremities are intact.   Neurologic:   Awake, alert         Data Reviewed: I have personally reviewed following labs and imaging studies  CBC: Recent Labs  Lab 12/02/22 0240 12/03/22 0307 12/04/22 0417 12/05/22 0336 12/07/22 0635  WBC 9.3 8.6 8.4 8.0 6.0  NEUTROABS 7.5  --   --   --   --   HGB 12.6* 12.2* 11.7* 10.9* 10.9*  HCT 36.9* 35.4* 35.0* 31.4* 32.9*  MCV 97.4 98.9 99.7 97.2 98.8  PLT 191 139* 136* 140* [id number]   Basic Metabolic Panel: Recent Labs  Lab 12/01/22 1757 12/01/22 1807 12/01/22 2311 12/02/22 0240 12/03/22 0307 12/04/22 0417 12/07/22 0635  NA 141 142  --  138 138 137 139  K 3.9 3.9  --  4.1 3.8 3.9 3.7  CL 104 106  --  105 104 106 108  CO2 22  --   --  20* 23 20* 22  GLUCOSE 96 93  --  144* 119* 101* 108*  BUN 19 20  --  20 21 18 16  $ CREATININE 1.43* 1.40*  --  1.48* 1.21 1.44* 1.12  CALCIUM 9.1  --   --  8.5* 8.1* 7.8* 7.9*  MG  --   --  1.7 1.7  --   --   --    GFR: Estimated Creatinine Clearance: 52.9 mL/min (by C-G formula based on SCr of 1.12 mg/dL). Liver Function Tests: Recent Labs  Lab 12/01/22 1757 12/02/22 0240  AST 18 23  ALT 12 16  ALKPHOS 63 56  BILITOT 0.6 1.2  PROT 6.7 6.0*  ALBUMIN 3.9 3.3*   No results for input(s): "LIPASE", "AMYLASE" in the last 168 hours. No results for input(s): "AMMONIA" in the last 168 hours. Coagulation Profile: Recent Labs   Lab 12/01/22 2311  INR 1.2   Cardiac Enzymes: No results for input(s): "CKTOTAL", "CKMB", "CKMBINDEX", "TROPONINI" in the last 168 hours. BNP (last 3 results) No results for input(s): "PROBNP" in the last 8760 hours. HbA1C: No results for input(s): "HGBA1C" in the last 72 hours.  CBG: Recent Labs  Lab 12/03/22 1240 12/03/22 1521 12/03/22 1738 12/04/22 0735  GLUCAP 107* 122* 119* 108*   Lipid Profile: No results for input(s): "CHOL", "HDL", "LDLCALC", "TRIG", "CHOLHDL", "LDLDIRECT" in the last 72 hours. Thyroid Function Tests: No results for input(s): "TSH", "T4TOTAL", "FREET4", "T3FREE", "THYROIDAB" in the last 72 hours. Anemia Panel: No results  for input(s): "VITAMINB12", "FOLATE", "FERRITIN", "TIBC", "IRON", "RETICCTPCT" in the last 72 hours. Sepsis Labs: Recent Labs  Lab 12/01/22 1757 12/02/22 0240  LATICACIDVEN 2.1* 1.5    Recent Results (from the past 240 hour(s))  MRSA Next Gen by PCR, Nasal     Status: None   Collection Time: 12/02/22  2:39 PM   Specimen: Nasal Mucosa; Nasal Swab  Result Value Ref Range Status   MRSA by PCR Next Gen NOT DETECTED NOT DETECTED Final    Comment: (NOTE) The GeneXpert MRSA Assay (FDA approved for NASAL specimens only), is one component of a comprehensive MRSA colonization surveillance program. It is not intended to diagnose MRSA infection nor to guide or monitor treatment for MRSA infections. Test performance is not FDA approved in patients less than 58 years old. Performed at [facility], [place] [address], [place], [place] [address]          Radiology Studies: No results found.      Scheduled Meds:  docusate sodium  100 mg Oral BID   enoxaparin (LOVENOX) injection  40 mg Subcutaneous Q24H   escitalopram  10 mg Oral Daily   melatonin  5 mg Oral QHS   pantoprazole  40 mg Oral QHS   polyethylene glycol  17 g Oral Daily   QUEtiapine  100 mg Oral QHS   QUEtiapine  50 mg Oral TID AC   tamsulosin  0.4 mg Oral  Daily   Continuous Infusions:  methocarbamol (ROBAXIN) IV       LOS: 6 days    Time spent: 45 minutes spent on chart review, discussion with nursing staff, consultants, updating family and interview/physical exam; more than 50% of that time was spent in counseling and/or coordination of care.    Geradine Girt, DO [facility] Available via Epic secure chat 7am-7pm After these hours, please refer to coverage provider listed on [email or web address] 12/07/2022, 8:47 AM

## 2022-12-08 DIAGNOSIS — S72002A Fracture of unspecified part of neck of left femur, initial encounter for closed fracture: Secondary | ICD-10-CM | POA: Diagnosis not present

## 2022-12-08 NOTE — Progress Notes (Signed)
Occupational Therapy Treatment Patient Details Name: John [name] MRN: [id number] DOB: 06-14-1935 Today's Date: 12/08/2022   History of present illness John [name] is a 88 y.o. male who presented with L hip pain 2/7 after ground level fall. 2/9 L THA. PMHx: coronary artery disease status two-vessel CABG in 1996, paroxysmal atrial fibrillation, GAD,post due to diabetes mellitus, hypertension, hyperlipidemia, obstructive sleep apnea not on nocturnal CPAP, chronic diastolic heart failure   OT comments  Patient demonstrated good gains with OT treatment. Patient was supervision to get to EOB and able to ambulate to bathroom and perform grooming tasks at sink. Patient required seated rest break following grooming and changed socks while seated with assistance for LLE. Patient performed further mobility and transfers in room to address toilet transfers and patient requesting to return to bed due to increased LLE hip pain. Patient's discharge recommendations continue to be appropriate for continue rehab to address LB dressing and functional transfers.    Recommendations for follow up therapy are one component of a multi-disciplinary discharge planning process, led by the attending physician.  Recommendations may be updated based on patient status, additional functional criteria and insurance authorization.    Follow Up Recommendations  Skilled nursing-short term rehab (<3 hours/day)     Assistance Recommended at Discharge Frequent or constant Supervision/Assistance  Patient can return home with the following  A lot of help with walking and/or transfers;A lot of help with bathing/dressing/bathroom;Assistance with cooking/housework;Direct supervision/assist for medications management;Direct supervision/assist for financial management;Assist for transportation;Help with stairs or ramp for entrance   Equipment Recommendations  Other (comment) (defer)    Recommendations for Other Services       Precautions / Restrictions Precautions Precautions: Fall Precaution Comments: watch BP Restrictions Weight Bearing Restrictions: No LLE Weight Bearing: Weight bearing as tolerated       Mobility Bed Mobility Overal bed mobility: Needs Assistance Bed Mobility: Supine to Sit, Sit to Supine     Supine to sit: HOB elevated, Supervision Sit to supine: Supervision   General bed mobility comments: no physical assistance    Transfers Overall transfer level: Needs assistance Equipment used: Rolling walker (2 wheels) Transfers: Sit to/from Stand Sit to Stand: Min guard           General transfer comment: min guard to power up from EOB and recliner     Balance Overall balance assessment: Needs assistance Sitting-balance support: Feet supported Sitting balance-Leahy Scale: Good     Standing balance support: During functional activity, Single extremity supported Standing balance-Leahy Scale: Poor Standing balance comment: able to stand at sink for grooming tasks with one extremity assist                           ADL either performed or assessed with clinical judgement   ADL Overall ADL's : Needs assistance/impaired     Grooming: Wash/dry hands;Wash/dry face;Oral care;Brushing hair;Min guard;Standing               Lower Body Dressing: Moderate assistance;Sitting/lateral leans Lower Body Dressing Details (indicate cue type and reason): able to donn sock on RLE and required assistance with left Toilet Transfer: Min guard;Ambulation;Rolling walker (2 wheels) Toilet Transfer Details (indicate cue type and reason): simulated           General ADL Comments: supervision to min guard for safety for standing ADLs    Extremity/Trunk Assessment              Vision  Perception     Praxis      Cognition Arousal/Alertness: Awake/alert Behavior During Therapy: WFL for tasks assessed/performed Overall Cognitive Status: Within Functional  Limits for tasks assessed                                          Exercises      Shoulder Instructions       General Comments VSS on RA    Pertinent Vitals/ Pain       Pain Assessment Pain Assessment: Faces Faces Pain Scale: Hurts a little bit Pain Location: L hip Pain Descriptors / Indicators: Grimacing, Guarding Pain Intervention(s): Monitored during session, Repositioned, Patient requesting pain meds-RN notified  Home Living                                          Prior Functioning/Environment              Frequency  Min 2X/week        Progress Toward Goals  OT Goals(current goals can now be found in the care plan section)  Progress towards OT goals: Progressing toward goals  Acute Rehab OT Goals Patient Stated Goal: get more rehab at SNF OT Goal Formulation: With patient Time For Goal Achievement: 12/18/22 Potential to Achieve Goals: Good ADL Goals Pt Will Perform Grooming: with supervision;standing Pt Will Perform Lower Body Dressing: with min assist;sit to/from stand Pt Will Transfer to Toilet: with min guard assist;ambulating Additional ADL Goal #1: Pt will complete bed mobility with supervision A as a precursor to ADLs  Plan Discharge plan remains appropriate    Co-evaluation                 AM-PAC OT "6 Clicks" Daily Activity     Outcome Measure   Help from another person eating meals?: None Help from another person taking care of personal grooming?: A Little Help from another person toileting, which includes using toliet, bedpan, or urinal?: A Little Help from another person bathing (including washing, rinsing, drying)?: A Lot Help from another person to put on and taking off regular upper body clothing?: A Little Help from another person to put on and taking off regular lower body clothing?: A Lot 6 Click Score: 17    End of Session Equipment Utilized During Treatment: Gait belt;Rolling walker  (2 wheels)  OT Visit Diagnosis: Unsteadiness on feet (R26.81);Other abnormalities of gait and mobility (R26.89);Muscle weakness (generalized) (M62.81);History of falling (Z91.81);Pain Pain - Right/Left: Left Pain - part of body: Hip   Activity Tolerance Patient tolerated treatment well   Patient Left in bed;with call bell/phone within reach;with bed alarm set   Nurse Communication Mobility status;Patient requests pain meds        Time: [id number] OT Time Calculation (min): 24 min  Charges: OT General Charges $OT Visit: 1 Visit OT Treatments $Self Care/Home Management : 23-37 mins  Lodema Hong, [place]  Office [phone]   Trixie Dredge 12/08/2022, 1:19 PM

## 2022-12-08 NOTE — TOC Progression Note (Signed)
Transition of Care [facility]) - Progression Note    Patient Details  Name: John [name] MRN: [id number] Date of Birth: [birth year]  Transition of Care [facility]) CM/SW Contact  Joanne Chars, LCSW Phone Number: 12/08/2022, 1:44 PM  Clinical Narrative:   TC Stephanie/HTA. She asked if CSW wants to close out auth request due to pt not being medically stable.  CSW asked to wait until tomorrow AM as MD indicated pt may be stable shortly.  Colletta [place] will keep auth request active.     Expected Discharge Plan: Assisted Living Barriers to Discharge: Continued Medical Work up  Expected Discharge Plan and Services     Post Acute Care Choice: [place] arrangements for the past 2 months: [place]                                       Social Determinants of Health (SDOH) Interventions SDOH Screenings   Food Insecurity: No Food Insecurity (12/07/2022)  Housing: Low Risk  (12/07/2022)  Transportation Needs: No Transportation Needs (12/07/2022)  Utilities: Not At Risk (12/07/2022)  Depression (PHQ2-9): Low Risk  (06/30/2021)  Tobacco Use: Medium Risk (12/07/2022)    Readmission Risk Interventions     No data to display

## 2022-12-08 NOTE — TOC Progression Note (Addendum)
Transition of Care [facility]) - Progression Note    Patient Details  Name: John [name] MRN: [id number] Date of Birth: 10-03-1935  Transition of [facility]) CM/SW Contact  Joanne Chars, LCSW Phone Number: 12/08/2022, 1:43 PM  Clinical Narrative:   CSW spoke with Stephanie/HTA, confirmed that per MD, pt medically stable for SNF, requested update on auth: Still under review.  1515: TC Stephanie/HTA.  They need to see MD note from today that fever issue has resolved and pt is stable.  MD informed.  Expected Discharge Plan: Assisted Living Barriers to Discharge: Continued Medical Work up  Expected Discharge Plan and Services     Post Acute Care Choice: [place] arrangements for the past 2 months: [place]                                       Social Determinants of Health (SDOH) Interventions SDOH Screenings   Food Insecurity: No Food Insecurity (12/07/2022)  Housing: Low Risk  (12/07/2022)  Transportation Needs: No Transportation Needs (12/07/2022)  Utilities: Not At Risk (12/07/2022)  Depression (PHQ2-9): Low Risk  (06/30/2021)  Tobacco Use: Medium Risk (12/07/2022)    Readmission Risk Interventions     No data to display

## 2022-12-08 NOTE — Progress Notes (Signed)
PROGRESS NOTE  John [name] [id number] DOB: [birth year] DOA: 12/01/2022 PCP: Unk Pinto, MD   LOS: 7 days   Brief Narrative / Interim history: John [name] is a 88 y.o. male with medical history significant for coronary artery disease status two-vessel CABG in 1996, paroxysmal atrial fibrillation, GAD,post due to diabetes mellitus, hypertension, hyperlipidemia, obstructive sleep apnea not on nocturnal CPAP, chronic diastolic heart failure, who is admitted to [facility] on 12/01/2022 with acute left femoral neck fracture after presenting from home to [facility] ED complaining of left hip pain.  OR on 2/9.   Subjective / 24h Interval events: Doing well today.  He has no complaints, no shortness of breath, no chest discomfort  Assesement and Plan: Principal Problem:   Left displaced femoral neck fracture (HCC) Active Problems:   DM2 (diabetes mellitus, type 2) (HCC)   HLD (hyperlipidemia)   Essential hypertension   GERD (gastroesophageal reflux disease)   Fall   Lactic acidosis   Nodule of middle lobe of right lung   Paroxysmal atrial fibrillation (HCC)   Chronic diastolic CHF (congestive heart failure) (HCC)   GAD (generalized anxiety disorder)   BPH (benign prostatic hyperplasia)   Principal problem Acute left femoral neck fracture - confirmed via presenting plain films and stemming from ground level fall, orthopedic surgery and underwent operative repair on 2/9.  Continue pain control, DVT prophylaxis, plan for SNF.  Active problems Fever -patient was febrile 2/12-2/13 overnight, chest x-ray without infiltrate/pneumonia, urinalysis unremarkable.  Fever resolved, WBC is normal and he is otherwise asymptomatic.   Presyncope resulting in ground-level fall -believed to be due to orthostatic hypotension, patient has a history of the same.  Needs SNF  Nodular opacity in the right midlung -Presenting chest x-ray shows slight interval increase in prominence of nodular  area of scarring in the right midlung relative to plan films of the chest performed in February 2023, which, per radiology, possibly indicates a developing pulmonary nodule.  Radiology recommends CT chest for further correlation, which it appears can be pursued on a nonemergent basis as an outpatient.    Paroxysmal atrial fibrillation -he can off anticoagulation as an outpatient due to recurrent orthostatic hypotension and increased fall risk.  Further discussion by cardiology as an outpatient  Chronic diastolic heart failure -appears euvolemic  Generalized anxiety disorder - resume home meds   Type 2 diabetes mellitus - Most recent hemoglobin A1c was noted to be 5.3% in June 2021    GERD -resume home meds    Hyperlipidemia -resume home meds    Essential Hypertension -resume home meds    Benign Prostatic Hyperplasia -continue home medications   CKD stage 3a -creatinine stable and at baseline  Scheduled Meds:  docusate sodium  100 mg Oral BID   enoxaparin (LOVENOX) injection  40 mg Subcutaneous Q24H   escitalopram  10 mg Oral Daily   melatonin  5 mg Oral QHS   midodrine  5 mg Oral BID WC   pantoprazole  40 mg Oral QHS   polyethylene glycol  17 g Oral Daily   QUEtiapine  100 mg Oral QHS   QUEtiapine  50 mg Oral TID AC   tamsulosin  0.4 mg Oral Daily   Continuous Infusions:  methocarbamol (ROBAXIN) IV     PRN Meds:.acetaminophen **OR** acetaminophen, alum & mag hydroxide-simeth, HYDROcodone-acetaminophen, HYDROmorphone (DILAUDID) injection, magnesium citrate, menthol-cetylpyridinium **OR** phenol, methocarbamol **OR** methocarbamol (ROBAXIN) IV, naLOXone (NARCAN)  injection, ondansetron **OR** ondansetron (ZOFRAN) IV, oxyCODONE, oxyCODONE, polyethylene glycol, sorbitol  Current Outpatient Medications  Medication Instructions   acetaminophen (PAIN RELIEF EXTRA STRENGTH) 1,000 mg, Oral, Every 6 hours PRN   ALPRAZolam (XANAX XR) 0.5 MG 24 hr tablet Take 1 tablet  Daily at [facility] for  Chronic Anxiety   amLODipine (NORVASC) 5 mg, Oral, 2 times daily PRN   atorvastatin (LIPITOR) 40 MG tablet Take  1 tablet  Daily  for Cholesterol   Cholecalciferol 50 MCG (2000 UT) TABS Take 1  capsule  Daily   enoxaparin (LOVENOX) 40 mg, Subcutaneous, Daily   escitalopram (LEXAPRO) 10 MG tablet Take 1 tablet Daily                               TAKE   BY                                MOUTH   finasteride (PROSCAR) 5 MG tablet Take  1 tablet  Daily  for Prostate   glucose blood (FREESTYLE LITE) test strip TEST BLOOD SUGAR ONCE DAILY   Lancets (FREESTYLE) lancets 1 each, Other, As needed, Use as instructed    loperamide (ANTI-DIARRHEAL) 2 MG tablet Take  1 tablet  2 x /day   loperamide (IMODIUM A-D) 2 mg, Oral, 2 times daily PRN   midodrine (PROAMATINE) 5 MG tablet Take 1 tablet 2 x /daily with Meals for Low BP   nitroGLYCERIN (NITROSTAT) 0.4 MG SL tablet Sig: 1 tablet under tongue every 3 to 5 minutes as needed for Angina - Please Dispense # 2 bottles of # 25 tabs   oxyCODONE-acetaminophen (PERCOCET) 5-325 MG tablet 1-2 tablets, Oral, 2 times daily PRN   pantoprazole (PROTONIX) 40 MG tablet TAKE (1) TABLET BY MOUTH ONCE DAILY. **DO NOT CRUSH**   polyethylene glycol (MIRALAX / GLYCOLAX) 17 g, Oral, Daily PRN   QUEtiapine (SEROQUEL) 25 MG tablet Take  1 tablet 3 x /day before Meals                                                       /                                          TAKE                                        BY                                              MOUTH   QUEtiapine (SEROQUEL) 100 mg, Oral, Daily at bedtime   tamsulosin (FLOMAX) 0.4 MG CAPS capsule TAKE (1) CAPSULE BY MOUTH DAILY AFTER SUPPER.   traMADol (ULTRAM) 100 mg, Oral, Every 8 hours PRN   vitamin C (ASCORBIC ACID) 500 MG tablet Take 1 tablet Daily     Diet Orders (From admission, onward)     Start     Ordered  12/03/22 1629  Diet Heart Room service appropriate? Yes; Fluid consistency: Thin  Diet effective now        Question Answer Comment  Room service appropriate? Yes   Fluid consistency: Thin      12/03/22 1628            DVT prophylaxis: enoxaparin (LOVENOX) injection 40 mg Start: 12/04/22 1000 SCDs Start: 12/03/22 1629 Place TED hose Start: 12/03/22 1629 SCDs Start: 12/01/22 2043   Lab Results  Component Value Date   PLT 177 12/07/2022      Code Status: DNR  Family Communication: no family at bedside   Status is: Inpatient  Remains inpatient appropriate because: SNF placement pending  Level of care: Med-Surg  Consultants:  Orthopedic surgery   Objective: Vitals:   12/07/22 2300 12/08/22 0615 12/08/22 0931 12/08/22 1234  BP: 138/64 (!) 147/71 (!) 156/77 (!) 155/73  Pulse: 82 71 60 68  Resp: 16 16 18 18  $ Temp: 99.7 F (37.6 C) 98 F (36.7 C) 98 F (36.7 C) 97.9 F (36.6 C)  TempSrc: Oral Oral Oral Oral  SpO2: 95% 94% 94% 96%  Weight:      Height:        Intake/Output Summary (Last 24 hours) at 12/08/2022 1354 Last data filed at 12/08/2022 0749 Gross per 24 hour  Intake 240 ml  Output 1050 ml  Net -810 ml   Wt Readings from Last 3 Encounters:  12/07/22 83.7 kg  12/01/21 82.2 kg  06/30/21 81.6 kg    Examination:  Constitutional: NAD Eyes: no scleral icterus ENMT: Mucous membranes are moist.  Neck: normal, supple Respiratory: clear to auscultation bilaterally, no wheezing, no crackles. Normal respiratory effort. No accessory muscle use.  Cardiovascular: Regular rate and rhythm, no murmurs / rubs / gallops. No LE edema.  Abdomen: non distended, no tenderness. Bowel sounds positive.  Musculoskeletal: no clubbing / cyanosis.    Data Reviewed: I have independently reviewed following labs and imaging studies   CBC Recent Labs  Lab 12/02/22 0240 12/03/22 0307 12/04/22 0417 12/05/22 0336 12/07/22 0635  WBC 9.3 8.6 8.4 8.0 6.0  HGB 12.6* 12.2* 11.7* 10.9* 10.9*  HCT 36.9* 35.4* 35.0* 31.4* 32.9*  PLT 191 139* 136* 140* 177  MCV 97.4 98.9 99.7  97.2 98.8  MCH 33.2 34.1* 33.3 33.7 32.7  MCHC 34.1 34.5 33.4 34.7 33.1  RDW 13.5 13.8 13.5 13.4 13.6  LYMPHSABS 1.0  --   --   --   --   MONOABS 0.7  --   --   --   --   EOSABS 0.0  --   --   --   --   BASOSABS 0.0  --   --   --   --     Recent Labs  Lab 12/01/22 1757 12/01/22 1807 12/01/22 2311 12/02/22 0240 12/03/22 0307 12/04/22 0417 12/07/22 0635  NA 141 142  --  138 138 137 139  K 3.9 3.9  --  4.1 3.8 3.9 3.7  CL 104 106  --  105 104 106 108  CO2 22  --   --  20* 23 20* 22  GLUCOSE 96 93  --  144* 119* 101* 108*  BUN 19 20  --  20 21 18 16  $ CREATININE 1.43* 1.40*  --  1.48* 1.21 1.44* 1.12  CALCIUM 9.1  --   --  8.5* 8.1* 7.8* 7.9*  AST 18  --   --  23  --   --   --  ALT 12  --   --  16  --   --   --   ALKPHOS 63  --   --  56  --   --   --   BILITOT 0.6  --   --  1.2  --   --   --   ALBUMIN 3.9  --   --  3.3*  --   --   --   MG  --   --  1.7 1.7  --   --   --   LATICACIDVEN 2.1*  --   --  1.5  --   --   --   INR  --   --  1.2  --   --   --   --   HGBA1C  --   --   --  5.4  --   --   --     ------------------------------------------------------------------------------------------------------------------ No results for input(s): "CHOL", "HDL", "LDLCALC", "TRIG", "CHOLHDL", "LDLDIRECT" in the last 72 hours.  Lab Results  Component Value Date   HGBA1C 5.4 12/02/2022   ------------------------------------------------------------------------------------------------------------------ No results for input(s): "TSH", "T4TOTAL", "T3FREE", "THYROIDAB" in the last 72 hours.  Invalid input(s): "FREET3"  Cardiac Enzymes No results for input(s): "CKMB", "TROPONINI", "MYOGLOBIN" in the last 168 hours.  Invalid input(s): "CK" ------------------------------------------------------------------------------------------------------------------ No results found for: "BNP"  CBG: Recent Labs  Lab 12/03/22 1240 12/03/22 1521 12/03/22 1738 12/04/22 0735  GLUCAP 107* 122*  119* 108*    Recent Results (from the past 240 hour(s))  MRSA Next Gen by PCR, Nasal     Status: None   Collection Time: 12/02/22  2:39 PM   Specimen: Nasal Mucosa; Nasal Swab  Result Value Ref Range Status   MRSA by PCR Next Gen NOT DETECTED NOT DETECTED Final    Comment: (NOTE) The GeneXpert MRSA Assay (FDA approved for NASAL specimens only), is one component of a comprehensive MRSA colonization surveillance program. It is not intended to diagnose MRSA infection nor to guide or monitor treatment for MRSA infections. Test performance is not FDA approved in patients less than 64 years old. Performed at [facility], [place] [address], [place], [place] [address]      Radiology Studies: No results found.   Marzetta Board, MD, PhD [facility]  Between 7 am - 7 pm I am available, please contact me via Amion (for emergencies) or Securechat (non urgent messages)  Between 7 pm - 7 am I am not available, please contact night coverage MD/APP via Amion

## 2022-12-09 DIAGNOSIS — N400 Enlarged prostate without lower urinary tract symptoms: Secondary | ICD-10-CM | POA: Diagnosis not present

## 2022-12-09 DIAGNOSIS — K219 Gastro-esophageal reflux disease without esophagitis: Secondary | ICD-10-CM | POA: Diagnosis not present

## 2022-12-09 DIAGNOSIS — I480 Paroxysmal atrial fibrillation: Secondary | ICD-10-CM | POA: Diagnosis not present

## 2022-12-09 DIAGNOSIS — S72002D Fracture of unspecified part of neck of left femur, subsequent encounter for closed fracture with routine healing: Secondary | ICD-10-CM | POA: Diagnosis not present

## 2022-12-09 DIAGNOSIS — Z4889 Encounter for other specified surgical aftercare: Secondary | ICD-10-CM | POA: Diagnosis not present

## 2022-12-09 DIAGNOSIS — S72002A Fracture of unspecified part of neck of left femur, initial encounter for closed fracture: Secondary | ICD-10-CM | POA: Diagnosis not present

## 2022-12-09 DIAGNOSIS — R197 Diarrhea, unspecified: Secondary | ICD-10-CM | POA: Diagnosis not present

## 2022-12-09 DIAGNOSIS — Z4789 Encounter for other orthopedic aftercare: Secondary | ICD-10-CM | POA: Diagnosis not present

## 2022-12-09 DIAGNOSIS — I5032 Chronic diastolic (congestive) heart failure: Secondary | ICD-10-CM | POA: Diagnosis not present

## 2022-12-09 DIAGNOSIS — Z951 Presence of aortocoronary bypass graft: Secondary | ICD-10-CM | POA: Diagnosis not present

## 2022-12-09 DIAGNOSIS — F323 Major depressive disorder, single episode, severe with psychotic features: Secondary | ICD-10-CM | POA: Diagnosis not present

## 2022-12-09 DIAGNOSIS — R911 Solitary pulmonary nodule: Secondary | ICD-10-CM | POA: Diagnosis not present

## 2022-12-09 DIAGNOSIS — W19XXXD Unspecified fall, subsequent encounter: Secondary | ICD-10-CM | POA: Diagnosis not present

## 2022-12-09 DIAGNOSIS — E872 Acidosis, unspecified: Secondary | ICD-10-CM | POA: Diagnosis not present

## 2022-12-09 DIAGNOSIS — Z96642 Presence of left artificial hip joint: Secondary | ICD-10-CM | POA: Diagnosis not present

## 2022-12-09 DIAGNOSIS — M79605 Pain in left leg: Secondary | ICD-10-CM | POA: Diagnosis not present

## 2022-12-09 DIAGNOSIS — I10 Essential (primary) hypertension: Secondary | ICD-10-CM | POA: Diagnosis not present

## 2022-12-09 DIAGNOSIS — I2510 Atherosclerotic heart disease of native coronary artery without angina pectoris: Secondary | ICD-10-CM | POA: Diagnosis not present

## 2022-12-09 DIAGNOSIS — F411 Generalized anxiety disorder: Secondary | ICD-10-CM | POA: Diagnosis not present

## 2022-12-09 DIAGNOSIS — G4733 Obstructive sleep apnea (adult) (pediatric): Secondary | ICD-10-CM | POA: Diagnosis not present

## 2022-12-09 DIAGNOSIS — Z79899 Other long term (current) drug therapy: Secondary | ICD-10-CM | POA: Diagnosis not present

## 2022-12-09 NOTE — Progress Notes (Signed)
2/15 Patient discharged before copy of IMM Letter was given. However, initial and follow-up letter were given. I will mail the copy of the IMM Letter to the address on file.

## 2022-12-09 NOTE — Care Management Important Message (Signed)
Important Message  Patient Details  Name: John [name] MRN: [id number] Date of Birth: 1935/05/23   Medicare Important Message Given:  Other (see comment)     Hannah Beat 12/09/2022, 2:28 PM

## 2022-12-09 NOTE — Discharge Summary (Signed)
Physician Discharge Summary  John [name] [id number] DOB: 1935-08-02 DOA: 12/01/2022  PCP: John Pinto, MD  Admit date: 12/01/2022 Discharge date: 12/09/2022  Admitted From: home Disposition:  SNF  Recommendations for Outpatient Follow-up:  Follow up with PCP in 1-2 weeks Follow-up with orthopedic surgery in 2 weeks  Home Health: none Equipment/Devices: none  Discharge Condition: stable CODE STATUS: DNR Diet Orders (From admission, onward)     Start     Ordered   12/03/22 1629  Diet Heart Room service appropriate? Yes; Fluid consistency: Thin  Diet effective now       Question Answer Comment  Room service appropriate? Yes   Fluid consistency: Thin      12/03/22 1628            Brief Narrative / Interim history: John [name] is a 88 y.o. male with medical history significant for coronary artery disease status two-vessel CABG in 1996, paroxysmal atrial fibrillation, GAD,post due to diabetes mellitus, hypertension, hyperlipidemia, obstructive sleep apnea not on nocturnal CPAP, chronic diastolic heart failure, who is admitted to [facility] on 12/01/2022 with acute left femoral neck fracture after presenting from home to [facility] ED complaining of left hip pain.  OR on 2/9.   Hospital Course / Discharge diagnoses: Principal Problem:   Left displaced femoral neck fracture (HCC) Active Problems:   DM2 (diabetes mellitus, type 2) (HCC)   HLD (hyperlipidemia)   Essential hypertension   GERD (gastroesophageal reflux disease)   Fall   Lactic acidosis   Nodule of middle lobe of right lung   Paroxysmal atrial fibrillation (HCC)   Chronic diastolic CHF (congestive heart failure) (HCC)   GAD (generalized anxiety disorder)   BPH (benign prostatic hyperplasia)   Principal problem Acute left femoral neck fracture - confirmed via presenting plain films and stemming from ground level fall, orthopedic surgery and underwent operative repair on 2/9.  Continue pain  control, DVT prophylaxis, plan for SNF.   Active problems Fever -patient was febrile 2/12-2/13 overnight, chest x-ray without infiltrate/pneumonia, urinalysis unremarkable.  Fever resolved, WBC is normal and he is otherwise asymptomatic.  Presyncope resulting in ground-level fall -believed to be due to orthostatic hypotension, patient has a history of the same.  Needs SNF Nodular opacity in the right midlung -Presenting chest x-ray shows slight interval increase in prominence of nodular area of scarring in the right midlung relative to plan films of the chest performed in February 2023, which, per radiology, possibly indicates a developing pulmonary nodule.  Radiology recommends CT chest for further correlation, which it appears can be pursued on a nonemergent basis as an outpatient.  Paroxysmal atrial fibrillation -he can off anticoagulation as an outpatient due to recurrent orthostatic hypotension and increased fall risk.  Further discussion by cardiology as an outpatient Chronic diastolic heart failure -appears euvolemic Generalized anxiety disorder - resume home meds Type 2 diabetes mellitus - Most recent hemoglobin A1c was noted to be 5.3% in June 2021  GERD -resume home meds  Hyperlipidemia -resume home meds  Essential Hypertension -resume home meds  Benign Prostatic Hyperplasia -continue home medications CKD stage 3a -creatinine stable and at baseline  Sepsis ruled out   Discharge Instructions  Discharge Instructions     Weight bearing as tolerated   Complete by: As directed       Allergies as of 12/09/2022       Reactions   Gabapentin Other (See Comments)   Severe confusion   Prozac [fluoxetine Hcl] Other (See Comments)  Patient state that it does not agree with him   [place] [carisoprodol] Other (See Comments)   Patient stated that it does not agree with him   Beta Adrenergic Blockers Other (See Comments)   REACTION: Bradycardia        Medication List     STOP  taking these medications    ALPRAZolam 0.5 MG 24 hr tablet Commonly known as: XANAX XR   finasteride 5 MG tablet Commonly known as: PROSCAR   traMADol 50 MG tablet Commonly known as: ULTRAM       TAKE these medications    acetaminophen 500 MG tablet Commonly known as: Pain Relief Extra Strength Take 2 tablets (1,000 mg total) by mouth every 6 (six) hours as needed (for pain).   amLODipine 5 MG tablet Commonly known as: NORVASC Take 5 mg by mouth 2 (two) times daily as needed (for standing BP > 160. Not to exceed 2 tablets/day.).   ascorbic acid 500 MG tablet Commonly known as: VITAMIN C Take 1 tablet Daily   atorvastatin 40 MG tablet Commonly known as: LIPITOR Take  1 tablet  Daily  for Cholesterol   Cholecalciferol 50 MCG (2000 UT) Tabs Take 1  capsule  Daily   enoxaparin 40 MG/0.4ML injection Commonly known as: LOVENOX Inject 0.4 mLs (40 mg total) into the skin daily for 14 days.   escitalopram 10 MG tablet Commonly known as: LEXAPRO Take 1 tablet Daily                               TAKE   BY                                MOUTH   freestyle lancets 1 each by Other route as needed for other. Use as instructed   FREESTYLE LITE test strip Generic drug: glucose blood TEST BLOOD SUGAR ONCE DAILY   loperamide 2 MG tablet Commonly known as: IMODIUM A-D Take 2 mg by mouth 2 (two) times daily as needed for diarrhea or loose stools.   loperamide 2 MG tablet Commonly known as: Anti-Diarrheal Take  1 tablet  2 x /day   midodrine 5 MG tablet Commonly known as: PROAMATINE Take 1 tablet 2 x /daily with Meals for Low BP   nitroGLYCERIN 0.4 MG SL tablet Commonly known as: NITROSTAT Sig: 1 tablet under tongue every 3 to 5 minutes as needed for Angina - Please Dispense # 2 bottles of # 25 tabs   oxyCODONE-acetaminophen 5-325 MG tablet Commonly known as: Percocet Take 1-2 tablets by mouth 2 (two) times daily as needed for severe pain.   pantoprazole 40 MG  tablet Commonly known as: PROTONIX TAKE (1) TABLET BY MOUTH ONCE DAILY. **DO NOT CRUSH** What changed: additional instructions   polyethylene glycol 17 g packet Commonly known as: MIRALAX / GLYCOLAX Take 17 g by mouth daily as needed for moderate constipation.   QUEtiapine 100 MG tablet Commonly known as: SEROQUEL Take 100 mg by mouth at bedtime.   QUEtiapine 25 MG tablet Commonly known as: SEROQUEL Take  1 tablet 3 x /day before Meals                                                       /  TAKE                                        BY                                              MOUTH   tamsulosin 0.4 MG Caps capsule Commonly known as: FLOMAX TAKE (1) CAPSULE BY MOUTH DAILY AFTER SUPPER. What changed: additional instructions               Discharge Care Instructions  (From admission, onward)           Start     Ordered   12/03/22 0000  Weight bearing as tolerated        12/03/22 1456            Follow-up Information     Leandrew Koyanagi, MD Follow up in 2 week(s).   Specialty: Orthopedic Surgery Why: For suture removal, For wound re-check Contact information: [place] [address] [phone]                Consultations: Orthopedic surgery   Procedures/Studies:  DG CHEST PORT 1 VIEW  Result Date: 12/07/2022 CLINICAL DATA:  Fever EXAM: PORTABLE CHEST 1 VIEW COMPARISON:  Radiograph 12/01/2022 FINDINGS: Unchanged cardiomediastinal silhouette with prior median sternotomy and postsurgical changes of CABG. Partially visualized cervical spine fusion hardware. There are reticular opacities in the right lung and left basilar lung, similar to recent radiograph, with somewhat nodular appearance of the right mid lung as noted on prior exam. No new airspace disease. No large pleural effusion or evidence of pneumothorax. Unchanged hiatal hernia. Thoracic spondylosis. Bilateral shoulder degenerative  changes. IMPRESSION: Stable reticular opacities in the right lung and left basilar lung, favored to reflect scarring, with somewhat nodular appearance in the right mid lung. Recommend correlation with chest CT. No new airspace consolidation. Unchanged hiatal hernia. Electronically Signed   By: Maurine Simmering M.D.   On: 12/07/2022 09:03   Pelvis Portable  Result Date: 12/03/2022 CLINICAL DATA:  History of hip replacement, postop. EXAM: PORTABLE PELVIS 1-2 VIEWS COMPARISON:  Preoperative radiograph 12/01/2022 FINDINGS: Left hip arthroplasty in expected alignment. No periprosthetic lucency or fracture. Recent postsurgical change includes air and edema in the soft tissues. IMPRESSION: Left hip arthroplasty without immediate postoperative complication. Electronically Signed   By: Keith Rake M.D.   On: 12/03/2022 15:42   DG HIP UNILAT WITH PELVIS 1V LEFT  Result Date: 12/03/2022 CLINICAL DATA:  Total left hip arthroplasty. EXAM: DG HIP (WITH OR WITHOUT PELVIS) 1V*L* COMPARISON:  Left hip radiographs 12/01/2022 FINDINGS: Images were performed intraoperatively without the presence of a radiologist. The patient is undergoing left hip arthroplasty. No hardware complication is seen. Total fluoroscopy images: 4 Total fluoroscopy time: 5 seconds Total dose: Radiation Exposure Index (as provided by the fluoroscopic device): 0.612 mGy air Kerma Please see intraoperative findings for further detail. IMPRESSION: Intraoperative fluoroscopic images obtained during left hip arthroplasty. Electronically Signed   By: Yvonne Kendall M.D.   On: 12/03/2022 15:11   DG C-Arm 1-60 Min-No Report  Result Date: 12/03/2022 Fluoroscopy was utilized by the requesting physician.  No radiographic interpretation.   DG C-Arm 1-60 Min-No Report  Result Date: 12/03/2022 Fluoroscopy was utilized by the requesting physician.  No radiographic interpretation.   CT THORACIC SPINE WO CONTRAST  Result Date: 12/01/2022 CLINICAL DATA:  Fall.  Back  pain. EXAM: CT THORACIC SPINE WITHOUT CONTRAST TECHNIQUE: Multidetector CT images of the thoracic were obtained using the standard protocol without intravenous contrast. RADIATION DOSE REDUCTION: This exam was performed according to the departmental dose-optimization program which includes automated exposure control, adjustment of the mA and/or kV according to patient size and/or use of iterative reconstruction technique. COMPARISON:  Chest radiograph dated 12/01/2022 and 11/25/2021. FINDINGS: Evaluation of this exam is limited due to motion artifact. Alignment: No acute subluxation. Vertebrae: No acute fracture. Paraspinal and other soft tissues: No acute finding. No perispinal fluid collection or hematoma. There are areas of scarring in the lungs bilaterally. Evaluation of the lungs is limited due to respiratory motion. There is a moderate size hiatal hernia. There is coronary vascular calcification. Disc levels: No acute findings.  Degenerative changes. IMPRESSION: No acute/traumatic thoracic spine pathology. Electronically Signed   By: Anner Crete M.D.   On: 12/01/2022 19:34   DG Knee Left Port  Result Date: 12/01/2022 CLINICAL DATA:  Left hip fracture, left thigh pain, level 2 trauma, fall, left hip pain EXAM: PORTABLE LEFT KNEE - 1-2 VIEW COMPARISON:  None 2520 FINDINGS: Degenerative changes in the left knee with medial compartment narrowing and small osteophyte formation. No evidence of acute fracture or dislocation. No focal bone lesion or bone destruction. No significant effusions. Vascular calcifications in the soft tissues. Degenerative changes have progressed since prior study. IMPRESSION: Mild progressing degenerative changes in the left knee. No acute displaced fractures are identified. Electronically Signed   By: Lucienne Capers M.D.   On: 12/01/2022 19:34   DG Hip Unilat W or Wo Pelvis 2-3 Views Left  Result Date: 12/01/2022 CLINICAL DATA:  Trauma.  Level 2 trauma.  Fall.  Left hip pain.  EXAM: DG HIP (WITH OR WITHOUT PELVIS) 2-3V LEFT COMPARISON:  None Available. FINDINGS: Transverse fracture of the left femoral neck with varus angulation of the fracture fragments. No evidence of involvement of the inter trochanteric region. Visualized pelvis appears intact. Visualized sacrum appears intact. SI joints and symphysis pubis are not displaced. IMPRESSION: Transverse fracture left femoral neck with varus angulation of fracture fragments. Electronically Signed   By: Lucienne Capers M.D.   On: 12/01/2022 19:32   CT Lumbar Spine Wo Contrast  Result Date: 12/01/2022 CLINICAL DATA:  [place], trauma EXAM: CT LUMBAR SPINE WITHOUT CONTRAST TECHNIQUE: Multidetector CT imaging of the lumbar spine was performed without intravenous contrast administration. Multiplanar CT image reconstructions were also generated. RADIATION DOSE REDUCTION: This exam was performed according to the departmental dose-optimization program which includes automated exposure control, adjustment of the mA and/or kV according to patient size and/or use of iterative reconstruction technique. COMPARISON:  06/25/2015 FINDINGS: Segmentation: 5 lumbar type vertebrae. Alignment: Mild right convex scoliosis centered at L3. Otherwise alignment is anatomic. Vertebrae: There is mild invagination of the superior endplate of the L1 vertebral body, consistent with age-indeterminate fracture. Would favor chronic change given the lack of visible fracture line or associated paraspinal soft tissue swelling. No other acute lumbar spine fractures. Unilateral right pars defect at L5 with no evidence of spondylolisthesis. 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 Paraspinal and other soft tissues: Paraspinal soft tissues are unremarkable. Atherosclerosis within the aorta and its distal branches. Disc levels: Disc space height is relatively well preserved. Mild broad-based disc bulge at L2-3 and L3-4 without  significant compressive sequela. Mild broad-based disc bulge at the L5-S1 level results in minimal symmetrical neural foraminal encroachment. IMPRESSION: 1. Slight invagination superior endplate L1 vertebral body, likely chronic. No other signs of acute lumbar spine fracture. 2. Mild spondylosis. 3. 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 Electronically Signed   By: Randa Ngo M.D.   On: 12/01/2022 19:32   DG Chest Port 1 View  Result Date: 12/01/2022 CLINICAL DATA:  Trauma.  Level 2 trauma.  Fall.  Left hip pain. EXAM: PORTABLE CHEST 1 VIEW COMPARISON:  11/25/2021 FINDINGS: Postoperative changes in the mediastinum. Heart size and pulmonary vascularity are normal. Nodular area of scarring suggested in the right mid lung is more prominent than on prior study, possibly indicating a developing pulmonary nodule. CT correlation is suggested. No airspace disease or consolidation otherwise identified. No pleural effusions. No pneumothorax. Mediastinal contours appear intact. Calcified and tortuous aorta. Esophageal hiatal hernia behind the heart. Degenerative changes in the spine and shoulders. Postoperative changes in the cervical spine. IMPRESSION: 1. Developing nodular opacity in the right mid lung. CT suggested for further evaluation. 2. Otherwise, no evidence of active pulmonary disease. Electronically Signed   By: Lucienne Capers M.D.   On: 12/01/2022 19:32   CT CERVICAL SPINE WO CONTRAST  Result Date: 12/01/2022 CLINICAL DATA:  Poly trauma, blunt.  Level 2 fall on thinners EXAM: CT CERVICAL SPINE WITHOUT CONTRAST TECHNIQUE: Multidetector CT imaging of the cervical spine was performed without intravenous contrast. Multiplanar CT image reconstructions were also generated. RADIATION DOSE REDUCTION: This exam was performed according to the departmental dose-optimization program which includes automated exposure control, adjustment of the mA and/or kV according to patient size  and/or use of iterative reconstruction technique. COMPARISON:  01/15/2021 FINDINGS: Alignment: Normal alignment Skull base and vertebrae: No acute fracture. No primary bone lesion or focal pathologic process. Soft tissues and spinal canal: No prevertebral fluid or swelling. No visible canal hematoma. Disc levels: Postoperative changes with anterior plate and screw fixation and intervertebral fusion at C5 through C7. Degenerative changes with disc space narrowing and endplate osteophyte formation most prominent at C4-5 and C7-T1. Degenerative changes in the facet joints. Upper chest: Lung apices are clear. Other: Postoperative changes in the mediastinum with sternotomy wires visible. IMPRESSION: Normal alignment of the cervical spine. No acute displaced fractures identified. Degenerative and postoperative changes as discussed. Electronically Signed   By: Lucienne Capers M.D.   On: 12/01/2022 19:30   CT HEAD WO CONTRAST  Result Date: 12/01/2022 CLINICAL DATA:  Head trauma, moderate to severe. Level 2 fall on thinners. Poly trauma, blunt EXAM: CT HEAD WITHOUT CONTRAST TECHNIQUE: Contiguous axial images were obtained from the base of the skull through the vertex without intravenous contrast. RADIATION DOSE REDUCTION: This exam was performed according to the departmental dose-optimization program which includes automated exposure control, adjustment of the mA and/or kV according to patient size and/or use of iterative reconstruction technique. COMPARISON:  11/25/2021 FINDINGS: Brain: Diffuse cerebral atrophy. Ventricular dilatation consistent with central atrophy. Low-attenuation changes in the deep white matter consistent with small vessel ischemia. No abnormal extra-axial fluid collections. No mass effect or midline shift. Gray-white matter junctions are distinct. Basal cisterns are not effaced. No acute intracranial hemorrhage. Old encephalomalacia in the right anterior frontal lobe is unchanged since prior study,  likely old infarct. Vascular: Intracranial arterial calcifications. Skull: Normal. Negative for fracture or focal lesion. Sinuses/Orbits: No acute finding. Other: None. IMPRESSION: No acute intracranial abnormalities. Chronic atrophy and small vessel ischemic  changes. Electronically Signed   By: Lucienne Capers M.D.   On: 12/01/2022 19:27    Subjective: - no chest pain, shortness of breath, no abdominal pain, nausea or vomiting.   Discharge Exam: BP 130/65 (BP Location: Left Arm)   Pulse 100   Temp 97.6 F (36.4 C) (Oral)   Resp 18   Ht 6' (1.829 m)   Wt 83.7 kg   SpO2 93%   BMI 25.03 kg/m   General: Pt is alert, awake, not in acute distress Cardiovascular: RRR, S1/S2 +, no rubs, no gallops Respiratory: CTA bilaterally, no wheezing, no rhonchi Abdominal: Soft, NT, ND, bowel sounds + Extremities: no edema, no cyanosis   The results of significant diagnostics from this hospitalization (including imaging, microbiology, ancillary and laboratory) are listed below for reference.     Microbiology: Recent Results (from the past 240 hour(s))  MRSA Next Gen by PCR, Nasal     Status: None   Collection Time: 12/02/22  2:39 PM   Specimen: Nasal Mucosa; Nasal Swab  Result Value Ref Range Status   MRSA by PCR Next Gen NOT DETECTED NOT DETECTED Final    Comment: (NOTE) The GeneXpert MRSA Assay (FDA approved for NASAL specimens only), is one component of a comprehensive MRSA colonization surveillance program. It is not intended to diagnose MRSA infection nor to guide or monitor treatment for MRSA infections. Test performance is not FDA approved in patients less than 74 years old. Performed at [facility], [place] [address] [place], [place] [address]      Labs: Basic Metabolic Panel: Recent Labs  Lab 12/03/22 [id number] 12/04/22 0417 12/07/22 0635  NA 138 137 139  K 3.8 3.9 3.7  CL 104 106 108  CO2 23 20* 22  GLUCOSE 119* 101* 108*  BUN 21 18 16  $ CREATININE 1.21 1.44* 1.12   CALCIUM 8.1* 7.8* 7.9*   Liver Function Tests: No results for input(s): "AST", "ALT", "ALKPHOS", "BILITOT", "PROT", "ALBUMIN" in the last 168 hours. CBC: Recent Labs  Lab 12/03/22 0307 12/04/22 0417 12/05/22 0336 12/07/22 0635  WBC 8.6 8.4 8.0 6.0  HGB 12.2* 11.7* 10.9* 10.9*  HCT 35.4* 35.0* 31.4* 32.9*  MCV 98.9 99.7 97.2 98.8  PLT 139* 136* 140* 177   CBG: Recent Labs  Lab 12/03/22 1240 12/03/22 1521 12/03/22 1738 12/04/22 0735  GLUCAP 107* 122* 119* 108*   Hgb A1c No results for input(s): "HGBA1C" in the last 72 hours. Lipid Profile No results for input(s): "CHOL", "HDL", "LDLCALC", "TRIG", "CHOLHDL", "LDLDIRECT" in the last 72 hours. Thyroid function studies No results for input(s): "TSH", "T4TOTAL", "T3FREE", "THYROIDAB" in the last 72 hours.  Invalid input(s): "FREET3" Urinalysis    Component Value Date/Time   COLORURINE YELLOW 12/07/2022 1210   APPEARANCEUR CLEAR 12/07/2022 1210   LABSPEC 1.024 12/07/2022 1210   PHURINE 5.0 12/07/2022 1210   GLUCOSEU NEGATIVE 12/07/2022 1210   HGBUR NEGATIVE 12/07/2022 1210   BILIRUBINUR NEGATIVE 12/07/2022 1210   KETONESUR 5 (A) 12/07/2022 1210   PROTEINUR 30 (A) 12/07/2022 1210   UROBILINOGEN 0.2 09/23/2014 1415   NITRITE NEGATIVE 12/07/2022 1210   LEUKOCYTESUR NEGATIVE 12/07/2022 1210    FURTHER DISCHARGE INSTRUCTIONS:   Get Medicines reviewed and adjusted: Please take all your medications with you for your next visit with your Primary MD   Laboratory/radiological data: Please request your Primary MD to go over all hospital tests and procedure/radiological results at the follow up, please ask your Primary MD to get all Hospital records sent  to his/her office.   In some cases, they will be blood work, cultures and biopsy results pending at the time of your discharge. Please request that your primary care M.D. goes through all the records of your hospital data and follows up on these results.   Also Note the  following: If you experience worsening of your admission symptoms, develop shortness of breath, life threatening emergency, suicidal or homicidal thoughts you must seek medical attention immediately by calling 911 or calling your MD immediately  if symptoms less severe.   You must read complete instructions/literature along with all the possible adverse reactions/side effects for all the Medicines you take and that have been prescribed to you. Take any new Medicines after you have completely understood and accpet all the possible adverse reactions/side effects.    Do not drive when taking Pain medications or sleeping medications (Benzodaizepines)   Do not take more than prescribed Pain, Sleep and Anxiety Medications. It is not advisable to combine anxiety,sleep and pain medications without talking with your primary care practitioner   Special Instructions: If you have smoked or chewed Tobacco  in the last 2 yrs please stop smoking, stop any regular Alcohol  and or any Recreational drug use.   Wear Seat belts while driving.   Please note: You were cared for by a hospitalist during your hospital stay. Once you are discharged, your primary care physician will handle any further medical issues. Please note that NO REFILLS for any discharge medications will be authorized once you are discharged, as it is imperative that you return to your primary care physician (or establish a relationship with a primary care physician if you do not have one) for your post hospital discharge needs so that they can reassess your need for medications and monitor your lab values.  Time coordinating discharge: 40 minutes  SIGNED:  Marzetta Board, MD, PhD 12/09/2022, 9:22 AM

## 2022-12-09 NOTE — Progress Notes (Signed)
AVS reviewed with Patient and family at bedside.  All questions answered.

## 2022-12-09 NOTE — TOC Transition Note (Signed)
Transition of Care [facility]) - CM/SW Discharge Note   Patient Details  Name: John [name] MRN: [id number] Date of Birth: 1935/01/29  Transition of Care [facility]) CM/SW Contact:  Joanne Chars, LCSW Phone Number: 12/09/2022, 10:20 AM   Clinical Narrative:   Pt discharging to [facility].  RN call report to [phone].  Pt daughter [name] will be transporting pt and will need help with getting pt into the vehicle at the main entrance.     Final next level of care: Skilled Nursing Facility Barriers to Discharge: Barriers Resolved   Patient Goals and CMS Choice CMS Medicare.gov Compare Post Acute Care list provided to:: Patient Represenative (must comment) Choice offered to / list presented to : Adult Children  Discharge Placement                Patient chooses bed at: [facility] [place] Patient to be transferred to facility by: daughter [name] Name of family member notified: daughter [name] Patient and family notified of of transfer: 12/09/22  Discharge Plan and Services Additional resources added to the After Visit Summary for       Post Acute Care Choice: Home Health                               Social Determinants of Health ([place]) Interventions SDOH Screenings   Food Insecurity: No Food Insecurity (12/07/2022)  Housing: Low Risk  (12/07/2022)  Transportation Needs: No Transportation Needs (12/07/2022)  Utilities: Not At Risk (12/07/2022)  Depression (PHQ2-9): Low Risk  (06/30/2021)  Tobacco Use: Medium Risk (12/07/2022)     Readmission Risk Interventions     No data to display

## 2022-12-09 NOTE — TOC Progression Note (Signed)
Transition of Care [facility]) - Progression Note    Patient Details  Name: John [name] MRN: [id number] Date of Birth: April 12, 1935  Transition of [facility]) CM/SW Contact  Joanne Chars, LCSW Phone Number: 12/09/2022, 9:52 AM  Clinical Narrative:   SNF auth approved: 5 days: [id number].  PTAR transportation denied.  MD informed.  1050: CSW spoke with daughter [name] regarding the family providing transportation.  She and her husband will be able to do so and will come to the hospital around 1130.      Expected Discharge Plan: Assisted Living Barriers to Discharge: Continued Medical Work up  Expected Discharge Plan and Services     Post Acute Care Choice: [place] arrangements for the past 2 months: [place] Expected Discharge Date: 12/09/22                                     Social Determinants of Health (SDOH) Interventions SDOH Screenings   Food Insecurity: No Food Insecurity (12/07/2022)  Housing: Low Risk  (12/07/2022)  Transportation Needs: No Transportation Needs (12/07/2022)  Utilities: Not At Risk (12/07/2022)  Depression (PHQ2-9): Low Risk  (06/30/2021)  Tobacco Use: Medium Risk (12/07/2022)    Readmission Risk Interventions     No data to display

## 2022-12-11 DIAGNOSIS — F323 Major depressive disorder, single episode, severe with psychotic features: Secondary | ICD-10-CM | POA: Diagnosis not present

## 2022-12-11 DIAGNOSIS — R197 Diarrhea, unspecified: Secondary | ICD-10-CM | POA: Diagnosis not present

## 2022-12-11 DIAGNOSIS — I10 Essential (primary) hypertension: Secondary | ICD-10-CM | POA: Diagnosis not present

## 2022-12-11 DIAGNOSIS — I480 Paroxysmal atrial fibrillation: Secondary | ICD-10-CM | POA: Diagnosis not present

## 2022-12-11 DIAGNOSIS — S72002A Fracture of unspecified part of neck of left femur, initial encounter for closed fracture: Secondary | ICD-10-CM | POA: Diagnosis not present

## 2022-12-11 DIAGNOSIS — R911 Solitary pulmonary nodule: Secondary | ICD-10-CM | POA: Diagnosis not present

## 2022-12-11 DIAGNOSIS — I2510 Atherosclerotic heart disease of native coronary artery without angina pectoris: Secondary | ICD-10-CM | POA: Diagnosis not present

## 2022-12-17 ENCOUNTER — Ambulatory Visit (INDEPENDENT_AMBULATORY_CARE_PROVIDER_SITE_OTHER): Payer: PPO | Admitting: Physician Assistant

## 2022-12-17 ENCOUNTER — Ambulatory Visit (INDEPENDENT_AMBULATORY_CARE_PROVIDER_SITE_OTHER): Payer: PPO

## 2022-12-17 DIAGNOSIS — Z96642 Presence of left artificial hip joint: Secondary | ICD-10-CM | POA: Diagnosis not present

## 2022-12-17 NOTE — Progress Notes (Signed)
Post-Op Visit Note   Patient: John [name]           Date of Birth: 1935-10-20           MRN: [id number] Visit Date: 12/17/2022 PCP: Unk Pinto, MD   Assessment & Plan:  Chief Complaint:  Chief Complaint  Patient presents with   Left Hip - Routine Post Op   Visit Diagnoses:  1. Status post total hip replacement, left     Plan: Patient is a pleasant 88 year old gentleman who comes in today 2 weeks status post left Hemi hip replacement from a femoral neck fracture, date of surgery 12/03/2022.  He has been doing well.  He has at collapse where he is getting physical therapy and is ambulating with a walker which is what he was using prior to the fall.  He has some discomfort to the thigh which is relieved with medicine.  Examination left hip reveals a well-healed surgical incision without evidence of infection or cellulitis.  Calves are soft nontender.  At this point, we will continue to advance with activity as tolerated.  Continue with physical therapy.  Follow-up in 4 weeks for recheck.  Call with concerns or questions.  Follow-Up Instructions: Return in about 4 weeks (around 01/14/2023).   Orders:  Orders Placed This Encounter  Procedures   XR Pelvis 1-2 Views   No orders of the defined types were placed in this encounter.   Imaging: XR Pelvis 1-2 Views  Result Date: 12/17/2022 Well-seated prosthesis without complication   PMFS History: Patient Active Problem List   Diagnosis Date Noted   Left displaced femoral neck fracture ([place]) 12/01/2022   Fall 12/01/2022   Lactic acidosis 12/01/2022   Nodule of middle lobe of right lung 12/01/2022   Paroxysmal atrial fibrillation (HCC) 12/01/2022   Chronic diastolic CHF (congestive heart failure) ([place]) 12/01/2022   GAD (generalized anxiety disorder) 12/01/2022   BPH (benign prostatic hyperplasia) 12/01/2022   COVID-19 virus infection 11/25/2021   History of intracranial hemorrhage 11/25/2021   Near syncope 11/25/2021    CAD (coronary artery disease)    Intracranial bleed ([place]) 01/15/2021   SAH (subarachnoid hemorrhage) ([place])    Non-traumatic rhabdomyolysis    Primary osteoarthritis of left knee 09/26/2019   S/P left knee arthroscopy 09/26/2019   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[place]) 09/25/2019   Adrenal insufficiency ([place]) 09/25/2019   Acute medial meniscus tear of left knee 08/09/2019   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 anxiety 02/19/2018   Diabetic peripheral neuropathy ([place]) 11/21/2017   BMI 21.0-21.9, adult 09/04/2015   GERD (gastroesophageal reflux disease) 07/09/2015   Autonomic postural hypotension 02/06/2015   CKD stage G3a/A1, GFR 45-59 and albumin creatinine ratio <30 mg/g ([place]) 03/20/2014   Medication management 02/06/2014   Vitamin D deficiency 02/06/2014   Gastric AV malformation 10/30/2012   History of colonic polyps 09/14/2011   DM2 (diabetes mellitus, type 2) ([place]) 03/12/2009   HLD (hyperlipidemia) 03/12/2009   Essential hypertension 03/12/2009   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 s/p 2V CABG  1996. b. Last cath 12/2016 -> patent grafts, med rx.   CAD, ARTERY BYPASS GRAFT    Cataract    CKD (chronic kidney disease), stage II    Colon polyps    adenomatous polyps   COPD    Depression    Diabetes mellitus without complication (HCC)    no meds now   Diverticulosis    Duodenal ulcer without hemorrhage or perforation 12/29/2011   GASTROESOPHAGEAL REFLUX DISEASE    HYPERLIPIDEMIA    Insomnia    Leukodystrophy    Memory loss    Nephrolithiasis    "I've had  over 320 kidney stones" (03/20/2014)   NEPHROLITHIASIS, HX OF 03/12/2009   Parkinsonism    Sleep apnea    does not use CPAP   Stroke (HCC)    TIA   SUPRAVENTRICULAR TACHYCARDIA, HX OF    SVT (supraventricular tachycardia) 03/12/2009   TIA    Ulcer     Family History  Problem Relation Age of Onset   Alcohol abuse Father    Stroke Mother    Heart disease Brother    Diabetes Brother    Heart disease Paternal Grandmother    Colon cancer Neg Hx    Stomach cancer Neg Hx    Esophageal cancer Neg Hx    Rectal cancer Neg Hx     Past Surgical History:  Procedure Laterality Date   ANKLE FRACTURE SURGERY Left ~ 2012   CARDIAC CATHETERIZATION     COLONOSCOPY     CORONARY ARTERY BYPASS GRAFT  1996   CABG X2   CYSTOSCOPY WITH URETEROSCOPY, STONE BASKETRY AND STENT PLACEMENT     Cystourethroscopy with stent removal.     EXCISIONAL HEMORRHOIDECTOMY     GASTRECTOMY     HIATAL HERNIA REPAIR     IRRIGATION AND DEBRIDEMENT SEBACEOUS CYST     "off my back   KNEE ARTHROSCOPY WITH MEDIAL MENISECTOMY Left 08/24/2019   Procedure: LEFT KNEE ARTHROSCOPY WITH PARTIAL MEDIAL MENISCECTOMY;  Surgeon: Leandrew Koyanagi, MD;  Location: [facility];  Service: Orthopedics;  Laterality: Left;   LEFT HEART CATH AND CORS/GRAFTS ANGIOGRAPHY N/A 12/23/2016   Procedure: Left Heart Cath and Cors/Grafts Angiography;  Surgeon: Burnell Blanks, MD;  Location: [facility];  Service: Cardiovascular;  Laterality: N/A;   POLYPECTOMY     PROSTATE SURGERY     "took the center of my prostate out"   TOTAL HIP ARTHROPLASTY Left 12/03/2022   Procedure: LEFT TOTAL HIP ARTHROPLASTY ANTERIOR APPROACH;  Surgeon: Leandrew Koyanagi, MD;  Location: [facility];  Service: Orthopedics;  Laterality: Left;   UPPER GASTROINTESTINAL ENDOSCOPY     VAGOTOMY     Social History   Occupational History   Occupation: Programme researcher, 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

## 2022-12-30 ENCOUNTER — Ambulatory Visit (INDEPENDENT_AMBULATORY_CARE_PROVIDER_SITE_OTHER): Payer: PPO | Admitting: Nurse Practitioner

## 2022-12-30 ENCOUNTER — Encounter: Payer: Self-pay | Admitting: Nurse Practitioner

## 2022-12-30 VITALS — BP 142/64 | HR 83 | Temp 97.9°F | Wt 182.0 lb

## 2022-12-30 DIAGNOSIS — K219 Gastro-esophageal reflux disease without esophagitis: Secondary | ICD-10-CM | POA: Diagnosis not present

## 2022-12-30 DIAGNOSIS — I951 Orthostatic hypotension: Secondary | ICD-10-CM

## 2022-12-30 DIAGNOSIS — R197 Diarrhea, unspecified: Secondary | ICD-10-CM

## 2022-12-30 DIAGNOSIS — Z8673 Personal history of transient ischemic attack (TIA), and cerebral infarction without residual deficits: Secondary | ICD-10-CM

## 2022-12-30 DIAGNOSIS — I5032 Chronic diastolic (congestive) heart failure: Secondary | ICD-10-CM

## 2022-12-30 DIAGNOSIS — E1142 Type 2 diabetes mellitus with diabetic polyneuropathy: Secondary | ICD-10-CM

## 2022-12-30 DIAGNOSIS — K31819 Angiodysplasia of stomach and duodenum without bleeding: Secondary | ICD-10-CM

## 2022-12-30 DIAGNOSIS — N1831 Chronic kidney disease, stage 3a: Secondary | ICD-10-CM

## 2022-12-30 DIAGNOSIS — N401 Enlarged prostate with lower urinary tract symptoms: Secondary | ICD-10-CM

## 2022-12-30 DIAGNOSIS — I25709 Atherosclerosis of coronary artery bypass graft(s), unspecified, with unspecified angina pectoris: Secondary | ICD-10-CM | POA: Diagnosis not present

## 2022-12-30 DIAGNOSIS — I10 Essential (primary) hypertension: Secondary | ICD-10-CM

## 2022-12-30 NOTE — Patient Instructions (Signed)
Hip Fracture Treated With ORIF, Care After The following information offers guidance on how to care for yourself after your procedure. Your health care provider may also give you more specific instructions. If you have problems or questions, contact your health care provider. What can I expect after the procedure? After the procedure, it is common to have: Pain. You will be given medicines to treat this. Swelling. Difficulty walking. Some redness or bruising around the incision. A small amount of fluid or blood from the incision. Follow these instructions at home: Medicines Take over-the-counter and prescription medicines only as told by your health care provider. You may be given a blood thinner to take for up to 6 weeks. This will help reduce the risk of developing a blood clot. It is important to use this medicine exactly as directed. Ask your health care provider if the medicine prescribed to you: Requires you to avoid driving or using machinery. Can cause constipation. You may need to take these actions to prevent or treat constipation: Drink enough fluid to keep your urine pale yellow. Take over-the-counter or prescription medicines. Eat foods that are high in fiber, such as beans, whole grains, and fresh fruits and vegetables. Limit foods that are high in fat and processed sugars, such as fried or sweet foods. You may be given calcium and vitamin D supplements to strengthen your bones. Bathing Do not take baths, swim, or use a hot tub until your health care provider approves. Ask your health care provider if you may take showers. You may only be allowed to take sponge baths. Keep your bandage (dressing) dry. Incision care  Follow instructions from your health care provider about how to take care of your incision. Make sure you: Wash your hands with soap and water for at least 20 seconds before and after you change your dressing. If soap and water are not available, use hand  sanitizer. Change your dressing as told by your health care provider. You may be asked to leave the dressing in place until your clinic visit. Leave stitches (sutures), staples, skin glue, or adhesive strips in place. These skin closures may need to stay in place for 2 weeks or longer. If adhesive strip edges start to loosen and curl up, you may trim the loose edges. Do not remove adhesive strips completely unless your health care provider tells you to do that. Check your incision area every day for signs of infection. Check for: More redness, swelling, or pain. More fluid or blood. Warmth. Pus or a bad smell. Managing pain, stiffness, and swelling  If directed, put ice on the affected area. To do this: Put ice in a plastic bag. Place a towel between your skin and the bag. Leave the ice on for 20 minutes, 2-3 times a day. Remove the ice if your skin turns bright red. This is very important. If you cannot feel pain, heat, or cold, you have a greater risk of damage to the area. Move your hips, knees, ankles, and toes often to reduce stiffness and swelling. Raise (elevate) your leg above the level of your heart while you are lying down. To do this, try putting a few pillows under your leg. Activity  Return to your normal activities as told by your health care provider. Ask your health care provider what activities are safe for you. Do exercises as told by your health care provider or physical therapist. This will help make your hip stronger and help you recover more quickly. Do not  use your injured limb to support (bear) your body weight until your health care provider says that you can. Follow weight-bearing restrictions as told by your health care provider. Use crutches, a walker, or other devices (assistive devices) to help you move around as directed. You may feel most comfortable using a raised surface when sitting on the toilet or in a chair. Consider using a toilet seat riser over the  toilet for comfort. General instructions Wear compression stockings as told by your health care provider. These stockings help to prevent blood clots and reduce swelling in your legs. Do not use any products that contain nicotine or tobacco. These products include cigarettes, chewing tobacco, and vaping devices, such as e-cigarettes. These can delay bone healing and increase your risk of infection. If you need help quitting, ask your health care provider. Keep all follow-up visits. This is important. This may include visits for: Physical therapy. Screening for osteoporosis. Osteoporosis is the thinning and loss of density in your bones. Contact a health care provider if: You have a fever. You have pain that is not helped with medicine. You have more redness, swelling, or pain at your incision area. You have more fluid or blood coming from your incision or leaking through your dressing. You notice that your incision feels warm to the touch. You have pus or a bad smell coming from your incision area. Get help right away if: You notice that the edges of your incision have come apart after the sutures or staples have been removed. You have pain, warmth, or tenderness in the back of your lower leg (calf). You have tingling or numbness in your leg. You have a pale and cold leg. You have trouble breathing. You have chest pain. These symptoms may be an emergency. Get help right away. Call 911. Do not wait to see if the symptoms will go away. Do not drive yourself to the hospital. Summary After the procedure, it is common to have some pain and swelling. Take pain medicines as directed by your health care provider. Icing may also help with pain control. Contact your health care provider if you have any signs of infection, such as more redness, swelling, or pain at your incision area, or more fluid or blood coming from your incision. This information is not intended to replace advice given to you by  your health care provider. Make sure you discuss any questions you have with your health care provider. Document Revised: 08/27/2021 Document Reviewed: 08/27/2021 Elsevier Patient Education  [place].

## 2022-12-30 NOTE — Progress Notes (Signed)
Hospital follow up  Assessment and Plan: Hospital visit follow up for:    Acute left femoral neck fracture - confirmed via presenting plain films and stemming from ground level fall, orthopedic surgery and underwent operative repair on 2/9.  Continue pain control, DVT prophylaxis, plan for SNF. Has transferred back to [facility] assisted living 6 days ago   Active problems Fever -patient was febrile 2/12-2/13 overnight, chest x-ray without infiltrate/pneumonia, urinalysis unremarkable.  Fever resolved,  Check CBC today  Presyncope resulting in ground-level fall -believed to be due to orthostatic hypotension, patient has a history of the same.  Monitor BP and only use Amlodipine if BP > 160. Midodrine '5mg'$  BID with meals for low BP Nodular opacity in the right midlung -Presenting chest x-ray shows slight interval increase in prominence of nodular area of scarring in the right midlung relative to plan films of the chest performed in February 2023, which, per radiology, possibly indicates a developing pulmonary nodule.  Radiology recommends CT chest for further correlation, which it appears can be pursued on a nonemergent basis as an outpatient.  Paroxysmal atrial fibrillation -he can go off anticoagulation as an outpatient due to recurrent orthostatic hypotension and increased fall risk.  Further discussion by cardiology as an outpatient Chronic diastolic heart failure -appears euvolemic, continue current meds and follow with cardiology Generalized anxiety disorder - Continue Lexapro 10 mg QD, Seroquel 25 mg TID before meals and 100 mg at bedtime Type 2 diabetes mellitus - Most recent hemoglobin A1c was noted to be 5.4% in 12/02/22- controlled without meds  GERD -Protonix 40 mg QD Hyperlipidemia -on no current meds, atorvastatin was discontinued Essential Hypertension -Amlodipine 5 mg BID as needed for BP > 160 Benign Prostatic Hyperplasia -continue tamsulosin 0.'4mg'$  daily after supper CKD stage 3a  -creatinine stable and at baseline  Hypocalcemia- Recheck CMP today  Diarrhea- Immodium 2 mg BID as needed  All medications were reviewed with patient and family and fully reconciled. All questions answered fully, and patient and family members were encouraged to call the office with any further questions or concerns. Discussed goal to avoid readmission related to this diagnosis.      Over 40 minutes of exam, counseling, chart review, and complex, high/moderate level critical decision making was performed this visit.   Future Appointments  Date Time Provider [place]  01/13/2023 10:30 AM Leandrew Koyanagi, MD [facility] None     HPI 88 y.o.male presents for follow up for transition from recent [facility] stay. Admit date to the hospital was 12/01/22, patient was discharged from the hospital on 12/09/22 . Went to [facility] on 215/24 and was discharged back to assisted living on 12/24/22 and our clinical staff contacted the office the day after discharge to set up a follow up appointment. The discharge summary, medications, and diagnostic test results were reviewed before meeting with the patient. The patient was admitted for:   John [name] is a 88 y.o. male with medical history significant for coronary artery disease status two-vessel CABG in 1996, paroxysmal atrial fibrillation, GAD,post due to diabetes mellitus, hypertension, hyperlipidemia, obstructive sleep apnea not on nocturnal CPAP, chronic diastolic heart failure, who is admitted to [facility] on 12/01/2022 with acute left femoral neck fracture after presenting from home to [facility] ED complaining of left hip pain.  OR on 2/9.    Hospital Course / Discharge diagnoses: Principal Problem:   Left displaced femoral neck fracture (HCC) Active Problems:   DM2 (diabetes mellitus, type 2) ([place])  HLD (hyperlipidemia)   Essential hypertension   GERD (gastroesophageal reflux disease)   Fall   Lactic acidosis   Nodule of middle lobe of right  lung   Paroxysmal atrial fibrillation (HCC)   Chronic diastolic CHF (congestive heart failure) (HCC)   GAD (generalized anxiety disorder)   BPH (benign prostatic hyperplasia)     Principal problem Acute left femoral neck fracture - confirmed via presenting plain films and stemming from ground level fall, orthopedic surgery and underwent operative repair on 2/9.  Continue pain control, DVT prophylaxis, plan for SNF.   Active problems Fever -patient was febrile 2/12-2/13 overnight, chest x-ray without infiltrate/pneumonia, urinalysis unremarkable.  Fever resolved, WBC is normal and he is otherwise asymptomatic.  Presyncope resulting in ground-level fall -believed to be due to orthostatic hypotension, patient has a history of the same.  Needs SNF Nodular opacity in the right midlung -Presenting chest x-ray shows slight interval increase in prominence of nodular area of scarring in the right midlung relative to plan films of the chest performed in February 2023, which, per radiology, possibly indicates a developing pulmonary nodule.  Radiology recommends CT chest for further correlation, which it appears can be pursued on a nonemergent basis as an outpatient.  Paroxysmal atrial fibrillation -he can off anticoagulation as an outpatient due to recurrent orthostatic hypotension and increased fall risk.  Further discussion by cardiology as an outpatient Chronic diastolic heart failure -appears euvolemic Generalized anxiety disorder - resume home meds Type 2 diabetes mellitus - Most recent hemoglobin A1c was noted to be 5.3% in June 2021  GERD -resume home meds  Hyperlipidemia -resume home meds  Essential Hypertension -resume home meds  Benign Prostatic Hyperplasia -continue home medications CKD stage 3a -creatinine stable and at baseline   Sepsis ruled out     Discharge Instructions   Discharge Instructions       Weight bearing as tolerated   Complete by: As directed     BP well controlled  with Amlodipine 5 mg BID. Denies headaches, chest pain , headaches or dizziness.  BP Readings from Last 3 Encounters:  12/30/22 (!) 142/64  12/09/22 130/65  12/03/21 (!) 154/68   BMI is Body mass index is 24.68 kg/m., he has been working on diet and exercise. Wt Readings from Last 3 Encounters:  12/30/22 182 lb (82.6 kg)  12/07/22 184 lb 8.4 oz (83.7 kg)  12/01/21 181 lb 3.5 oz (82.2 kg)   He does have issues with diarrhea every other day and will use 2 mg 2 times a day and that does help.     PT at [facility] for rehab.   Images while in the hospital: CT THORACIC SPINE WO CONTRAST  Result Date: 12/01/2022 CLINICAL DATA:  Fall.  Back pain. EXAM: CT THORACIC SPINE WITHOUT CONTRAST TECHNIQUE: Multidetector CT images of the thoracic were obtained using the standard protocol without intravenous contrast. RADIATION DOSE REDUCTION: This exam was performed according to the departmental dose-optimization program which includes automated exposure control, adjustment of the mA and/or kV according to patient size and/or use of iterative reconstruction technique. COMPARISON:  Chest radiograph dated 12/01/2022 and 11/25/2021. FINDINGS: Evaluation of this exam is limited due to motion artifact. Alignment: No acute subluxation. Vertebrae: No acute fracture. Paraspinal and other soft tissues: No acute finding. No perispinal fluid collection or hematoma. There are areas of scarring in the lungs bilaterally. Evaluation of the lungs is limited due to respiratory motion. There is a moderate size hiatal hernia. There is coronary vascular  calcification. Disc levels: No acute findings.  Degenerative changes. IMPRESSION: No acute/traumatic thoracic spine pathology. Electronically Signed   By: Anner Crete M.D.   On: 12/01/2022 19:34   DG Knee Left Port  Result Date: 12/01/2022 CLINICAL DATA:  Left hip fracture, left thigh pain, level 2 trauma, fall, left hip pain EXAM: PORTABLE LEFT KNEE - 1-2 VIEW COMPARISON:   None 2520 FINDINGS: Degenerative changes in the left knee with medial compartment narrowing and small osteophyte formation. No evidence of acute fracture or dislocation. No focal bone lesion or bone destruction. No significant effusions. Vascular calcifications in the soft tissues. Degenerative changes have progressed since prior study. IMPRESSION: Mild progressing degenerative changes in the left knee. No acute displaced fractures are identified. Electronically Signed   By: Lucienne Capers M.D.   On: 12/01/2022 19:34   DG Hip Unilat W or Wo Pelvis 2-3 Views Left  Result Date: 12/01/2022 CLINICAL DATA:  Trauma.  Level 2 trauma.  Fall.  Left hip pain. EXAM: DG HIP (WITH OR WITHOUT PELVIS) 2-3V LEFT COMPARISON:  None Available. FINDINGS: Transverse fracture of the left femoral neck with varus angulation of the fracture fragments. No evidence of involvement of the inter trochanteric region. Visualized pelvis appears intact. Visualized sacrum appears intact. SI joints and symphysis pubis are not displaced. IMPRESSION: Transverse fracture left femoral neck with varus angulation of fracture fragments. Electronically Signed   By: Lucienne Capers M.D.   On: 12/01/2022 19:32   CT Lumbar Spine Wo Contrast  Result Date: 12/01/2022 CLINICAL DATA:  [place], trauma EXAM: CT LUMBAR SPINE WITHOUT CONTRAST TECHNIQUE: Multidetector CT imaging of the lumbar spine was performed without intravenous contrast administration. Multiplanar CT image reconstructions were also generated. RADIATION DOSE REDUCTION: This exam was performed according to the departmental dose-optimization program which includes automated exposure control, adjustment of the mA and/or kV according to patient size and/or use of iterative reconstruction technique. COMPARISON:  06/25/2015 FINDINGS: Segmentation: 5 lumbar type vertebrae. Alignment: Mild right convex scoliosis centered at L3. Otherwise alignment is anatomic. Vertebrae: There is mild invagination of  the superior endplate of the L1 vertebral body, consistent with age-indeterminate fracture. Would favor chronic change given the lack of visible fracture line or associated paraspinal soft tissue swelling. No other acute lumbar spine fractures. Unilateral right pars defect at L5 with no evidence of spondylolisthesis. 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 Paraspinal and other soft tissues: Paraspinal soft tissues are unremarkable. Atherosclerosis within the aorta and its distal branches. Disc levels: Disc space height is relatively well preserved. Mild broad-based disc bulge at L2-3 and L3-4 without significant compressive sequela. Mild broad-based disc bulge at the L5-S1 level results in minimal symmetrical neural foraminal encroachment. IMPRESSION: 1. Slight invagination superior endplate L1 vertebral body, likely chronic. No other signs of acute lumbar spine fracture. 2. Mild spondylosis. 3. 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 Electronically Signed   By: Randa Ngo M.D.   On: 12/01/2022 19:32   DG Chest Port 1 View  Result Date: 12/01/2022 CLINICAL DATA:  Trauma.  Level 2 trauma.  Fall.  Left hip pain. EXAM: PORTABLE CHEST 1 VIEW COMPARISON:  11/25/2021 FINDINGS: Postoperative changes in the mediastinum. Heart size and pulmonary vascularity are normal. Nodular area of scarring suggested in the right mid lung is more prominent than on prior study, possibly indicating a developing pulmonary nodule. CT correlation is suggested. No airspace disease or consolidation otherwise identified. No pleural effusions. No  pneumothorax. Mediastinal contours appear intact. Calcified and tortuous aorta. Esophageal hiatal hernia behind the heart. Degenerative changes in the spine and shoulders. Postoperative changes in the cervical spine. IMPRESSION: 1. Developing nodular opacity in the right mid lung. CT suggested for further evaluation.  2. Otherwise, no evidence of active pulmonary disease. Electronically Signed   By: Lucienne Capers M.D.   On: 12/01/2022 19:32   CT CERVICAL SPINE WO CONTRAST  Result Date: 12/01/2022 CLINICAL DATA:  Poly trauma, blunt.  Level 2 fall on thinners EXAM: CT CERVICAL SPINE WITHOUT CONTRAST TECHNIQUE: Multidetector CT imaging of the cervical spine was performed without intravenous contrast. Multiplanar CT image reconstructions were also generated. RADIATION DOSE REDUCTION: This exam was performed according to the departmental dose-optimization program which includes automated exposure control, adjustment of the mA and/or kV according to patient size and/or use of iterative reconstruction technique. COMPARISON:  01/15/2021 FINDINGS: Alignment: Normal alignment Skull base and vertebrae: No acute fracture. No primary bone lesion or focal pathologic process. Soft tissues and spinal canal: No prevertebral fluid or swelling. No visible canal hematoma. Disc levels: Postoperative changes with anterior plate and screw fixation and intervertebral fusion at C5 through C7. Degenerative changes with disc space narrowing and endplate osteophyte formation most prominent at C4-5 and C7-T1. Degenerative changes in the facet joints. Upper chest: Lung apices are clear. Other: Postoperative changes in the mediastinum with sternotomy wires visible. IMPRESSION: Normal alignment of the cervical spine. No acute displaced fractures identified. Degenerative and postoperative changes as discussed. Electronically Signed   By: Lucienne Capers M.D.   On: 12/01/2022 19:30   CT HEAD WO CONTRAST  Result Date: 12/01/2022 CLINICAL DATA:  Head trauma, moderate to severe. Level 2 fall on thinners. Poly trauma, blunt EXAM: CT HEAD WITHOUT CONTRAST TECHNIQUE: Contiguous axial images were obtained from the base of the skull through the vertex without intravenous contrast. RADIATION DOSE REDUCTION: This exam was performed according to the departmental  dose-optimization program which includes automated exposure control, adjustment of the mA and/or kV according to patient size and/or use of iterative reconstruction technique. COMPARISON:  11/25/2021 FINDINGS: Brain: Diffuse cerebral atrophy. Ventricular dilatation consistent with central atrophy. Low-attenuation changes in the deep white matter consistent with small vessel ischemia. No abnormal extra-axial fluid collections. No mass effect or midline shift. Gray-white matter junctions are distinct. Basal cisterns are not effaced. No acute intracranial hemorrhage. Old encephalomalacia in the right anterior frontal lobe is unchanged since prior study, likely old infarct. Vascular: Intracranial arterial calcifications. Skull: Normal. Negative for fracture or focal lesion. Sinuses/Orbits: No acute finding. Other: None. IMPRESSION: No acute intracranial abnormalities. Chronic atrophy and small vessel ischemic changes. Electronically Signed   By: Lucienne Capers M.D.   On: 12/01/2022 19:27      Current Outpatient Medications (Cardiovascular):    amLODipine (NORVASC) 5 MG tablet, Take 5 mg by mouth 2 (two) times daily as needed (for standing BP > 160. Not to exceed 2 tablets/day.).   midodrine (PROAMATINE) 5 MG tablet, Take 1 tablet 2 x /daily with Meals for Low BP   nitroGLYCERIN (NITROSTAT) 0.4 MG SL tablet, Sig: 1 tablet under tongue every 3 to 5 minutes as needed for Angina - Please Dispense # 2 bottles of # 25 tabs   atorvastatin (LIPITOR) 40 MG tablet, Take  1 tablet  Daily  for Cholesterol (Patient not taking: Reported on 12/02/2022)   Current Outpatient Medications (Analgesics):    acetaminophen (PAIN RELIEF EXTRA STRENGTH) 500 MG tablet, Take 2 tablets (1,000 mg total)  by mouth every 6 (six) hours as needed (for pain).   oxyCODONE-acetaminophen (PERCOCET) 5-325 MG tablet, Take 1-2 tablets by mouth 2 (two) times daily as needed for severe pain. (Patient not taking: Reported on 12/30/2022)  Current  Outpatient Medications (Hematological):    enoxaparin (LOVENOX) 40 MG/0.4ML injection, Inject 0.4 mLs (40 mg total) into the skin daily for 14 days.  Current Outpatient Medications (Other):    Cholecalciferol 50 MCG (2000 UT) TABS, Take 1  capsule  Daily   escitalopram (LEXAPRO) 10 MG tablet, Take 1 tablet Daily                               TAKE   BY                                MOUTH   glucose blood (FREESTYLE LITE) test strip, TEST BLOOD SUGAR ONCE DAILY   Lancets (FREESTYLE) lancets, 1 each by Other route as needed for other. Use as instructed   loperamide (IMODIUM A-D) 2 MG tablet, Take 2 mg by mouth 2 (two) times daily as needed for diarrhea or loose stools.   pantoprazole (PROTONIX) 40 MG tablet, TAKE (1) TABLET BY MOUTH ONCE DAILY. **DO NOT CRUSH**   polyethylene glycol (MIRALAX / GLYCOLAX) 17 g packet, Take 17 g by mouth daily as needed for moderate constipation.   QUEtiapine (SEROQUEL) 100 MG tablet, Take 100 mg by mouth at bedtime.   QUEtiapine (SEROQUEL) 25 MG tablet, Take  1 tablet 3 x /day before Meals                                                       /                                          TAKE                                        BY                                              MOUTH   tamsulosin (FLOMAX) 0.4 MG CAPS capsule, TAKE (1) CAPSULE BY MOUTH DAILY AFTER SUPPER.   vitamin C (ASCORBIC ACID) 500 MG tablet, Take 1 tablet Daily    loperamide (ANTI-DIARRHEAL) 2 MG tablet, Take  1 tablet  2 x /day (Patient not taking: Reported on 12/30/2022)  Past Medical History:  Diagnosis Date   Anxiety    Arthritis    "lower back; left ankle" (03/20/2014)   ASTHMA    Blood transfusion    "related to OHS, ulcers"   CAD (coronary artery disease)    a. s/p 2V CABG 1996. b. Last cath 12/2016 -> patent grafts, med rx.   CAD, ARTERY BYPASS GRAFT    Cataract    CKD (chronic kidney disease), stage II    Colon  polyps    adenomatous polyps   COPD    Depression    Diabetes mellitus  without complication ([place])    no meds now   Diverticulosis    Duodenal ulcer without hemorrhage or perforation 12/29/2011   GASTROESOPHAGEAL REFLUX DISEASE    HYPERLIPIDEMIA    Insomnia    Leukodystrophy    Memory loss    Nephrolithiasis    "I've had over 320 kidney stones" (03/20/2014)   NEPHROLITHIASIS, HX OF 03/12/2009   Parkinsonism    Sleep apnea    does not use CPAP   Stroke (HCC)    TIA   SUPRAVENTRICULAR TACHYCARDIA, HX OF    SVT (supraventricular tachycardia) 03/12/2009   TIA    Ulcer      Allergies  Allergen Reactions   Gabapentin Other (See Comments)    Severe confusion   Prozac [Fluoxetine Hcl] Other (See Comments)    Patient state that it does not agree with him   Soma [Carisoprodol] Other (See Comments)    Patient stated that it does not agree with him   Beta Adrenergic Blockers Other (See Comments)    REACTION: Bradycardia    ROS: all negative except above.   Physical Exam: Filed Weights   12/30/22 1535  Weight: 182 lb (82.6 kg)   BP (!) 142/64   Pulse 83   Temp 97.9 F (36.6 C)   Wt 182 lb (82.6 kg)   SpO2 94%   BMI 24.68 kg/m  General Appearance: Well nourished, in no apparent distress. Eyes: PERRLA, EOMs, conjunctiva no swelling or erythema Sinuses: No Frontal/maxillary tenderness ENT/Mouth: Ext aud canals clear, TMs without erythema, bulging. No erythema, swelling, or exudate on post pharynx.  Tonsils not swollen or erythematous. Hearing normal.  Neck: Supple, thyroid normal.  Respiratory: Respiratory effort normal, BS equal bilaterally without rales, rhonchi, wheezing or stridor.  Cardio: RRR with no MRGs. Brisk peripheral pulses without edema.  Abdomen: Soft, + BS.  Non tender, no guarding, rebound, hernias, masses. Lymphatics: Non tender without lymphadenopathy.  Musculoskeletal: Full ROM, 5/5 strength except left leg 4/5 with slightly decreased ROM, normal gait.  Skin: Warm, dry without rashes, lesions, ecchymosis.  Neuro: Cranial nerves  intact. Normal muscle tone, no cerebellar symptoms. Sensation intact.  Psych: Normal affect- lives in assisted living facility.     Alycia Rossetti, NP 4:05 PM [facility]

## 2022-12-31 LAB — CBC WITH DIFFERENTIAL/PLATELET
Absolute Monocytes: 513 cells/uL (ref 200–950)
Basophils Absolute: 32 cells/uL (ref 0–200)
Basophils Relative: 0.6 %
Eosinophils Absolute: 178 cells/uL (ref 15–500)
Eosinophils Relative: 3.3 %
HCT: 35 % — ABNORMAL LOW (ref 38.5–50.0)
Hemoglobin: 12.1 g/dL — ABNORMAL LOW (ref 13.2–17.1)
Lymphs Abs: 1733 cells/uL (ref 850–3900)
MCH: 33.8 pg — ABNORMAL HIGH (ref 27.0–33.0)
MCHC: 34.6 g/dL (ref 32.0–36.0)
MCV: 97.8 fL (ref 80.0–100.0)
MPV: 10.5 fL (ref 7.5–12.5)
Monocytes Relative: 9.5 %
Neutro Abs: 2943 cells/uL (ref 1500–7800)
Neutrophils Relative %: 54.5 %
Platelets: 223 10*3/uL (ref 140–400)
RBC: 3.58 10*6/uL — ABNORMAL LOW (ref 4.20–5.80)
RDW: 12.6 % (ref 11.0–15.0)
Total Lymphocyte: 32.1 %
WBC: 5.4 10*3/uL (ref 3.8–10.8)

## 2022-12-31 LAB — COMPLETE METABOLIC PANEL WITH GFR
AG Ratio: 1.3 (calc) (ref 1.0–2.5)
ALT: 11 U/L (ref 9–46)
AST: 12 U/L (ref 10–35)
Albumin: 3.4 g/dL — ABNORMAL LOW (ref 3.6–5.1)
Alkaline phosphatase (APISO): 88 U/L (ref 35–144)
BUN: 14 mg/dL (ref 7–25)
CO2: 28 mmol/L (ref 20–32)
Calcium: 8.5 mg/dL — ABNORMAL LOW (ref 8.6–10.3)
Chloride: 108 mmol/L (ref 98–110)
Creat: 1.21 mg/dL (ref 0.70–1.22)
Globulin: 2.6 g/dL (calc) (ref 1.9–3.7)
Glucose, Bld: 115 mg/dL — ABNORMAL HIGH (ref 65–99)
Potassium: 4.2 mmol/L (ref 3.5–5.3)
Sodium: 146 mmol/L (ref 135–146)
Total Bilirubin: 0.4 mg/dL (ref 0.2–1.2)
Total Protein: 6 g/dL — ABNORMAL LOW (ref 6.1–8.1)
eGFR: 58 mL/min/{1.73_m2} — ABNORMAL LOW (ref >=60)

## 2023-01-13 ENCOUNTER — Ambulatory Visit: Payer: PPO | Admitting: Orthopaedic Surgery

## 2023-01-21 ENCOUNTER — Telehealth: Payer: Self-pay | Admitting: *Deleted

## 2023-01-21 NOTE — Patient Outreach (Signed)
  Care Coordination   Initial Visit Note   01/21/2023 Name: ANFERNEE OLA MRN: [id number] DOB: 09-20-1935  [name] is a 88 y.o. year old male who sees Unk Pinto, MD for primary care. I  spoke with pt's daughter, [name], by phone.  What matters to the patients health and wellness today?  Discussed Care Coordination program with her and declines needs at this time. Pt has returned to [facility] where "he is back to baseline after his rehab stay".     Goals Addressed   None     SDOH assessments and interventions completed:  No     Care Coordination Interventions:  No, not indicated   Follow up plan: No further intervention required.   Encounter Outcome:  Pt. Visit Completed

## 2023-01-21 NOTE — Patient Outreach (Signed)
  Care Coordination   01/21/2023 Name: John [name] MRN: [id number] DOB: September 18, 1935   Care Coordination Outreach Attempts:  An unsuccessful telephone outreach was attempted today to offer the patient information about available care coordination services as a benefit of their health plan.   Follow Up Plan:  Additional outreach attempts will be made to offer the patient care coordination information and services.   Encounter Outcome:  No Answer   Care Coordination Interventions:  No, not indicated   Eduard Clos, MSW, [place] Worker [facility] [employer] [facility] [phone]

## 2023-04-26 ENCOUNTER — Other Ambulatory Visit: Payer: Self-pay | Admitting: Internal Medicine

## 2023-04-26 DIAGNOSIS — R197 Diarrhea, unspecified: Secondary | ICD-10-CM

## 2023-04-26 MED ORDER — LOPERAMIDE HCL 2 MG PO CAPS: ORAL_CAPSULE | ORAL | 1 refills | Status: DC

## 2023-11-11 ENCOUNTER — Other Ambulatory Visit: Payer: Self-pay | Admitting: Internal Medicine

## 2023-11-11 DIAGNOSIS — J329 Chronic sinusitis, unspecified: Secondary | ICD-10-CM

## 2023-11-11 MED ORDER — PHENYLEPHRINE HCL 10 MG PO TABS: ORAL_TABLET | ORAL | 0 refills | Status: DC

## 2023-11-11 MED ORDER — BENZONATATE 200 MG PO CAPS
ORAL_CAPSULE | ORAL | 1 refills | Status: DC
Start: 2023-11-11 — End: 2023-11-11

## 2023-11-11 MED ORDER — BENZONATATE 200 MG PO CAPS: ORAL_CAPSULE | ORAL | 1 refills | Status: DC

## 2023-11-11 MED ORDER — PROMETHAZINE-DM 6.25-15 MG/5ML PO SYRP
ORAL_SOLUTION | ORAL | 1 refills | Status: DC
Start: 2023-11-11 — End: 2023-11-11

## 2023-11-11 MED ORDER — AZITHROMYCIN 250 MG PO TABS
ORAL_TABLET | ORAL | 1 refills | Status: DC
Start: 2023-11-11 — End: 2023-11-11

## 2023-11-11 MED ORDER — PROMETHAZINE-DM 6.25-15 MG/5ML PO SYRP: ORAL_SOLUTION | ORAL | 1 refills | Status: DC

## 2023-11-11 MED ORDER — PHENYLEPHRINE HCL 10 MG PO TABS
ORAL_TABLET | ORAL | 0 refills | Status: DC
Start: 2023-11-11 — End: 2023-11-11

## 2023-11-11 MED ORDER — AZITHROMYCIN 250 MG PO TABS: ORAL_TABLET | ORAL | 1 refills | Status: DC

## 2023-11-11 NOTE — Progress Notes (Signed)
  Family reports patient has head & chest congestion.  No Fever. O2 sats 95%

## 2023-11-28 ENCOUNTER — Other Ambulatory Visit: Payer: Self-pay

## 2023-11-28 DIAGNOSIS — F419 Anxiety disorder, unspecified: Secondary | ICD-10-CM

## 2023-11-28 DIAGNOSIS — R197 Diarrhea, unspecified: Secondary | ICD-10-CM

## 2023-11-28 MED ORDER — TAMSULOSIN HCL 0.4 MG PO CAPS: ORAL_CAPSULE | ORAL | 3 refills | Status: DC

## 2023-11-28 MED ORDER — AMLODIPINE BESYLATE 5 MG PO TABS: 5.0000 mg | ORAL_TABLET | Freq: Two times a day (BID) | ORAL | 3 refills | Status: DC | PRN

## 2023-11-28 MED ORDER — QUETIAPINE FUMARATE 25 MG PO TABS: ORAL_TABLET | ORAL | 12 refills | Status: DC

## 2023-11-28 MED ORDER — ESCITALOPRAM OXALATE 10 MG PO TABS
ORAL_TABLET | ORAL | 12 refills | Status: DC
Start: 2023-11-28 — End: 2024-01-16

## 2023-11-28 MED ORDER — MIDODRINE HCL 5 MG PO TABS: ORAL_TABLET | ORAL | 3 refills | Status: DC

## 2023-11-28 MED ORDER — PANTOPRAZOLE SODIUM 40 MG PO TBEC: DELAYED_RELEASE_TABLET | ORAL | 3 refills | Status: DC

## 2023-11-28 MED ORDER — VITAMIN C 500 MG PO TABS: 500.0000 mg | ORAL_TABLET | Freq: Every day | ORAL | 3 refills | Status: DC

## 2023-11-28 MED ORDER — LOPERAMIDE HCL 2 MG PO CAPS: ORAL_CAPSULE | ORAL | 3 refills | Status: DC

## 2023-11-28 MED ORDER — QUETIAPINE FUMARATE 100 MG PO TABS: 100.0000 mg | ORAL_TABLET | Freq: Every day | ORAL | 3 refills | Status: DC

## 2023-11-28 MED ORDER — NITROGLYCERIN 0.4 MG SL SUBL: SUBLINGUAL_TABLET | SUBLINGUAL | 6 refills | Status: DC

## 2023-12-08 ENCOUNTER — Other Ambulatory Visit: Payer: Self-pay

## 2023-12-08 DIAGNOSIS — J329 Chronic sinusitis, unspecified: Secondary | ICD-10-CM

## 2023-12-08 MED ORDER — BENZONATATE 200 MG PO CAPS: ORAL_CAPSULE | ORAL | 3 refills | Status: DC

## 2024-01-05 ENCOUNTER — Emergency Department (HOSPITAL_COMMUNITY)

## 2024-01-05 ENCOUNTER — Inpatient Hospital Stay (HOSPITAL_COMMUNITY)

## 2024-01-05 ENCOUNTER — Inpatient Hospital Stay (HOSPITAL_COMMUNITY)
Admission: EM | Admit: 2024-01-05 | Discharge: 2024-01-24 | DRG: 064 | Disposition: E | Source: Skilled Nursing Facility | Attending: Student | Admitting: Student

## 2024-01-05 ENCOUNTER — Encounter (HOSPITAL_COMMUNITY): Payer: Self-pay | Admitting: Neurology

## 2024-01-05 ENCOUNTER — Other Ambulatory Visit: Payer: Self-pay

## 2024-01-05 DIAGNOSIS — Z515 Encounter for palliative care: Secondary | ICD-10-CM | POA: Diagnosis not present

## 2024-01-05 DIAGNOSIS — I161 Hypertensive emergency: Secondary | ICD-10-CM | POA: Diagnosis present

## 2024-01-05 DIAGNOSIS — R1312 Dysphagia, oropharyngeal phase: Secondary | ICD-10-CM | POA: Diagnosis present

## 2024-01-05 DIAGNOSIS — R471 Dysarthria and anarthria: Secondary | ICD-10-CM | POA: Diagnosis present

## 2024-01-05 DIAGNOSIS — G90A Postural orthostatic tachycardia syndrome (POTS): Secondary | ICD-10-CM | POA: Diagnosis present

## 2024-01-05 DIAGNOSIS — Z8679 Personal history of other diseases of the circulatory system: Secondary | ICD-10-CM | POA: Diagnosis not present

## 2024-01-05 DIAGNOSIS — I2510 Atherosclerotic heart disease of native coronary artery without angina pectoris: Secondary | ICD-10-CM | POA: Diagnosis present

## 2024-01-05 DIAGNOSIS — E871 Hypo-osmolality and hyponatremia: Secondary | ICD-10-CM | POA: Diagnosis present

## 2024-01-05 DIAGNOSIS — Z8673 Personal history of transient ischemic attack (TIA), and cerebral infarction without residual deficits: Secondary | ICD-10-CM

## 2024-01-05 DIAGNOSIS — I951 Orthostatic hypotension: Secondary | ICD-10-CM | POA: Diagnosis not present

## 2024-01-05 DIAGNOSIS — I619 Nontraumatic intracerebral hemorrhage, unspecified: Principal | ICD-10-CM | POA: Diagnosis present

## 2024-01-05 DIAGNOSIS — N1831 Chronic kidney disease, stage 3a: Secondary | ICD-10-CM | POA: Diagnosis present

## 2024-01-05 DIAGNOSIS — G9341 Metabolic encephalopathy: Secondary | ICD-10-CM | POA: Diagnosis not present

## 2024-01-05 DIAGNOSIS — Z8249 Family history of ischemic heart disease and other diseases of the circulatory system: Secondary | ICD-10-CM

## 2024-01-05 DIAGNOSIS — E1165 Type 2 diabetes mellitus with hyperglycemia: Secondary | ICD-10-CM | POA: Diagnosis present

## 2024-01-05 DIAGNOSIS — J690 Pneumonitis due to inhalation of food and vomit: Secondary | ICD-10-CM | POA: Diagnosis present

## 2024-01-05 DIAGNOSIS — Z96642 Presence of left artificial hip joint: Secondary | ICD-10-CM | POA: Diagnosis present

## 2024-01-05 DIAGNOSIS — R29708 NIHSS score 8: Secondary | ICD-10-CM | POA: Diagnosis present

## 2024-01-05 DIAGNOSIS — I472 Ventricular tachycardia, unspecified: Secondary | ICD-10-CM | POA: Diagnosis not present

## 2024-01-05 DIAGNOSIS — R531 Weakness: Secondary | ICD-10-CM | POA: Diagnosis present

## 2024-01-05 DIAGNOSIS — Z7189 Other specified counseling: Secondary | ICD-10-CM | POA: Diagnosis not present

## 2024-01-05 DIAGNOSIS — J449 Chronic obstructive pulmonary disease, unspecified: Secondary | ICD-10-CM | POA: Diagnosis present

## 2024-01-05 DIAGNOSIS — Z903 Acquired absence of stomach [part of]: Secondary | ICD-10-CM

## 2024-01-05 DIAGNOSIS — E1122 Type 2 diabetes mellitus with diabetic chronic kidney disease: Secondary | ICD-10-CM | POA: Diagnosis present

## 2024-01-05 DIAGNOSIS — Z87891 Personal history of nicotine dependence: Secondary | ICD-10-CM

## 2024-01-05 DIAGNOSIS — E785 Hyperlipidemia, unspecified: Secondary | ICD-10-CM | POA: Diagnosis present

## 2024-01-05 DIAGNOSIS — Z951 Presence of aortocoronary bypass graft: Secondary | ICD-10-CM

## 2024-01-05 DIAGNOSIS — I130 Hypertensive heart and chronic kidney disease with heart failure and stage 1 through stage 4 chronic kidney disease, or unspecified chronic kidney disease: Secondary | ICD-10-CM | POA: Diagnosis present

## 2024-01-05 DIAGNOSIS — Z811 Family history of alcohol abuse and dependence: Secondary | ICD-10-CM

## 2024-01-05 DIAGNOSIS — I480 Paroxysmal atrial fibrillation: Secondary | ICD-10-CM | POA: Diagnosis present

## 2024-01-05 DIAGNOSIS — E1142 Type 2 diabetes mellitus with diabetic polyneuropathy: Secondary | ICD-10-CM | POA: Diagnosis present

## 2024-01-05 DIAGNOSIS — R296 Repeated falls: Secondary | ICD-10-CM | POA: Diagnosis present

## 2024-01-05 DIAGNOSIS — Z79899 Other long term (current) drug therapy: Secondary | ICD-10-CM

## 2024-01-05 DIAGNOSIS — E870 Hyperosmolality and hypernatremia: Secondary | ICD-10-CM | POA: Diagnosis present

## 2024-01-05 DIAGNOSIS — K219 Gastro-esophageal reflux disease without esophagitis: Secondary | ICD-10-CM | POA: Diagnosis present

## 2024-01-05 DIAGNOSIS — J4489 Other specified chronic obstructive pulmonary disease: Secondary | ICD-10-CM | POA: Diagnosis present

## 2024-01-05 DIAGNOSIS — G936 Cerebral edema: Secondary | ICD-10-CM | POA: Diagnosis present

## 2024-01-05 DIAGNOSIS — Z823 Family history of stroke: Secondary | ICD-10-CM

## 2024-01-05 DIAGNOSIS — I629 Nontraumatic intracranial hemorrhage, unspecified: Secondary | ICD-10-CM

## 2024-01-05 DIAGNOSIS — Z888 Allergy status to other drugs, medicaments and biological substances status: Secondary | ICD-10-CM

## 2024-01-05 DIAGNOSIS — E876 Hypokalemia: Secondary | ICD-10-CM | POA: Diagnosis present

## 2024-01-05 DIAGNOSIS — I5032 Chronic diastolic (congestive) heart failure: Secondary | ICD-10-CM | POA: Diagnosis present

## 2024-01-05 DIAGNOSIS — N179 Acute kidney failure, unspecified: Secondary | ICD-10-CM | POA: Diagnosis present

## 2024-01-05 DIAGNOSIS — Z9079 Acquired absence of other genital organ(s): Secondary | ICD-10-CM

## 2024-01-05 DIAGNOSIS — F411 Generalized anxiety disorder: Secondary | ICD-10-CM | POA: Diagnosis present

## 2024-01-05 DIAGNOSIS — G20C Parkinsonism, unspecified: Secondary | ICD-10-CM | POA: Diagnosis present

## 2024-01-05 DIAGNOSIS — W1830XA Fall on same level, unspecified, initial encounter: Secondary | ICD-10-CM | POA: Diagnosis present

## 2024-01-05 DIAGNOSIS — J9601 Acute respiratory failure with hypoxia: Secondary | ICD-10-CM | POA: Diagnosis not present

## 2024-01-05 DIAGNOSIS — Z66 Do not resuscitate: Secondary | ICD-10-CM | POA: Diagnosis present

## 2024-01-05 DIAGNOSIS — Y92091 Bathroom in other non-institutional residence as the place of occurrence of the external cause: Secondary | ICD-10-CM | POA: Diagnosis not present

## 2024-01-05 DIAGNOSIS — Z833 Family history of diabetes mellitus: Secondary | ICD-10-CM

## 2024-01-05 DIAGNOSIS — I610 Nontraumatic intracerebral hemorrhage in hemisphere, subcortical: Secondary | ICD-10-CM | POA: Diagnosis not present

## 2024-01-05 DIAGNOSIS — Z7952 Long term (current) use of systemic steroids: Secondary | ICD-10-CM

## 2024-01-05 DIAGNOSIS — N400 Enlarged prostate without lower urinary tract symptoms: Secondary | ICD-10-CM | POA: Diagnosis present

## 2024-01-05 DIAGNOSIS — I739 Peripheral vascular disease, unspecified: Secondary | ICD-10-CM | POA: Diagnosis not present

## 2024-01-05 LAB — TROPONIN I (HIGH SENSITIVITY)
Troponin I (High Sensitivity): 26 ng/L — ABNORMAL HIGH (ref <18)
Troponin I (High Sensitivity): 25 ng/L — ABNORMAL HIGH (ref <18)

## 2024-01-05 LAB — APTT: aPTT: 24 s (ref 24–36)

## 2024-01-05 LAB — I-STAT CHEM 8, ED
BUN: 16 mg/dL (ref 8–23)
Calcium, Ion: 1.04 mmol/L — ABNORMAL LOW (ref 1.15–1.40)
Chloride: 102 mmol/L (ref 98–111)
Creatinine, Ser: 1.4 mg/dL — ABNORMAL HIGH (ref 0.61–1.24)
Glucose, Bld: 116 mg/dL — ABNORMAL HIGH (ref 70–99)
HCT: 37 % — ABNORMAL LOW (ref 39.0–52.0)
Hemoglobin: 12.6 g/dL — ABNORMAL LOW (ref 13.0–17.0)
Potassium: 2.7 mmol/L — CL (ref 3.5–5.1)
Sodium: 143 mmol/L (ref 135–145)
TCO2: 32 mmol/L (ref 22–32)

## 2024-01-05 LAB — ECHOCARDIOGRAM COMPLETE
AR max vel: 2.81 cm2
AV Area VTI: 2.98 cm2
AV Area mean vel: 2.89 cm2
AV Mean grad: 7 mmHg
AV Peak grad: 12.3 mmHg
Ao pk vel: 1.75 m/s
Area-P 1/2: 4.8 cm2
P 1/2 time: 325 ms
S' Lateral: 3.25 cm

## 2024-01-05 LAB — DIFFERENTIAL
Abs Immature Granulocytes: 0.02 10*3/uL (ref 0.00–0.07)
Basophils Absolute: 0 10*3/uL (ref 0.0–0.1)
Basophils Relative: 1 %
Eosinophils Absolute: 0.2 10*3/uL (ref 0.0–0.5)
Eosinophils Relative: 4 %
Immature Granulocytes: 0 %
Lymphocytes Relative: 33 %
Lymphs Abs: 1.5 10*3/uL (ref 0.7–4.0)
Monocytes Absolute: 0.4 10*3/uL (ref 0.1–1.0)
Monocytes Relative: 8 %
Neutro Abs: 2.5 10*3/uL (ref 1.7–7.7)
Neutrophils Relative %: 54 %

## 2024-01-05 LAB — PROTIME-INR
INR: 1.1 (ref 0.8–1.2)
Prothrombin Time: 14.1 s (ref 11.4–15.2)

## 2024-01-05 LAB — CBC
HCT: 36.9 % — ABNORMAL LOW (ref 39.0–52.0)
Hemoglobin: 12.8 g/dL — ABNORMAL LOW (ref 13.0–17.0)
MCH: 34 pg (ref 26.0–34.0)
MCHC: 34.7 g/dL (ref 30.0–36.0)
MCV: 97.9 fL (ref 80.0–100.0)
Platelets: 227 10*3/uL (ref 150–400)
RBC: 3.77 MIL/uL — ABNORMAL LOW (ref 4.22–5.81)
RDW: 13.4 % (ref 11.5–15.5)
WBC: 4.6 10*3/uL (ref 4.0–10.5)
nRBC: 0 % (ref 0.0–0.2)

## 2024-01-05 LAB — CBG MONITORING, ED
Glucose-Capillary: 121 mg/dL — ABNORMAL HIGH (ref 70–99)
Glucose-Capillary: 135 mg/dL — ABNORMAL HIGH (ref 70–99)

## 2024-01-05 LAB — COMPREHENSIVE METABOLIC PANEL
ALT: 10 U/L (ref 0–44)
AST: 18 U/L (ref 15–41)
Albumin: 3.2 g/dL — ABNORMAL LOW (ref 3.5–5.0)
Alkaline Phosphatase: 56 U/L (ref 38–126)
Anion gap: 9 (ref 5–15)
BUN: 14 mg/dL (ref 8–23)
CO2: 29 mmol/L (ref 22–32)
Calcium: 8.4 mg/dL — ABNORMAL LOW (ref 8.9–10.3)
Chloride: 104 mmol/L (ref 98–111)
Creatinine, Ser: 1.3 mg/dL — ABNORMAL HIGH (ref 0.61–1.24)
GFR, Estimated: 53 mL/min — ABNORMAL LOW (ref >60)
Glucose, Bld: 125 mg/dL — ABNORMAL HIGH (ref 70–99)
Potassium: 2.6 mmol/L — CL (ref 3.5–5.1)
Sodium: 142 mmol/L (ref 135–145)
Total Bilirubin: 0.8 mg/dL (ref 0.0–1.2)
Total Protein: 6 g/dL — ABNORMAL LOW (ref 6.5–8.1)

## 2024-01-05 LAB — HEMOGLOBIN A1C
Hgb A1c MFr Bld: 5.2 % (ref 4.8–5.6)
Mean Plasma Glucose: 102.54 mg/dL

## 2024-01-05 LAB — CK: Total CK: 117 U/L (ref 49–397)

## 2024-01-05 LAB — MAGNESIUM: Magnesium: 2 mg/dL (ref 1.7–2.4)

## 2024-01-05 LAB — GLUCOSE, CAPILLARY: Glucose-Capillary: 110 mg/dL — ABNORMAL HIGH (ref 70–99)

## 2024-01-05 LAB — ETHANOL: Alcohol, Ethyl (B): <10 mg/dL (ref <10)

## 2024-01-05 MED ORDER — PERFLUTREN LIPID MICROSPHERE
1.0000 mL | INTRAVENOUS | Status: AC | PRN
  Administered 2024-01-05: 3 mL via INTRAVENOUS

## 2024-01-05 MED ORDER — ACETAMINOPHEN 160 MG/5ML PO SOLN: 650.0000 mg | ORAL | Status: DC | PRN

## 2024-01-05 MED ORDER — SODIUM CHLORIDE 0.9% FLUSH
3.0000 mL | Freq: Once | INTRAVENOUS | Status: AC
  Administered 2024-01-05: 3 mL via INTRAVENOUS

## 2024-01-05 MED ORDER — MAGNESIUM SULFATE 2 GM/50ML IV SOLN
2.0000 g | Freq: Once | INTRAVENOUS | Status: AC
  Administered 2024-01-05: 2 g via INTRAVENOUS
  Filled 2024-01-05: qty 50

## 2024-01-05 MED ORDER — PANTOPRAZOLE SODIUM 40 MG IV SOLR
40.0000 mg | Freq: Every day | INTRAVENOUS | Status: DC
  Administered 2024-01-05: 40 mg via INTRAVENOUS
  Filled 2024-01-05: qty 10

## 2024-01-05 MED ORDER — INSULIN ASPART 100 UNIT/ML IJ SOLN
0.0000 [IU] | INTRAMUSCULAR | Status: DC
  Administered 2024-01-05: 1 [IU] via SUBCUTANEOUS

## 2024-01-05 MED ORDER — ACETAMINOPHEN 650 MG RE SUPP
650.0000 mg | RECTAL | Status: DC | PRN
  Filled 2024-01-05: qty 1

## 2024-01-05 MED ORDER — POTASSIUM CHLORIDE 10 MEQ/100ML IV SOLN
10.0000 meq | INTRAVENOUS | Status: AC
  Administered 2024-01-05 (×4): 10 meq via INTRAVENOUS
  Filled 2024-01-05 (×4): qty 100

## 2024-01-05 MED ORDER — STROKE: EARLY STAGES OF RECOVERY BOOK
Freq: Once | Status: AC
  Filled 2024-01-05: qty 1

## 2024-01-05 MED ORDER — LABETALOL HCL 5 MG/ML IV SOLN
10.0000 mg | Freq: Once | INTRAVENOUS | Status: AC
  Administered 2024-01-05: 10 mg via INTRAVENOUS

## 2024-01-05 MED ORDER — IOHEXOL 350 MG/ML SOLN
75.0000 mL | Freq: Once | INTRAVENOUS | Status: AC | PRN
Start: 2024-01-05 — End: 2024-01-05
  Administered 2024-01-05: 75 mL via INTRAVENOUS

## 2024-01-05 MED ORDER — INSULIN ASPART 100 UNIT/ML IJ SOLN: 0.0000 [IU] | Freq: Every day | INTRAMUSCULAR | Status: DC

## 2024-01-05 MED ORDER — CLEVIDIPINE BUTYRATE 0.5 MG/ML IV EMUL
0.0000 mg/h | INTRAVENOUS | Status: DC
  Administered 2024-01-05: 2 mg/h via INTRAVENOUS
  Administered 2024-01-05: 6 mg/h via INTRAVENOUS
  Administered 2024-01-05: 8 mg/h via INTRAVENOUS
  Administered 2024-01-06: 4 mg/h via INTRAVENOUS
  Administered 2024-01-06: 6 mg/h via INTRAVENOUS
  Filled 2024-01-05 (×3): qty 100

## 2024-01-05 MED ORDER — SENNOSIDES-DOCUSATE SODIUM 8.6-50 MG PO TABS
1.0000 | ORAL_TABLET | Freq: Two times a day (BID) | ORAL | Status: DC
  Administered 2024-01-05 – 2024-01-07 (×5): 1 via ORAL
  Filled 2024-01-05 (×7): qty 1

## 2024-01-05 MED ORDER — ACETAMINOPHEN 325 MG PO TABS
650.0000 mg | ORAL_TABLET | ORAL | Status: DC | PRN
  Administered 2024-01-06: 650 mg via ORAL
  Filled 2024-01-05 (×2): qty 2

## 2024-01-05 MED ORDER — INSULIN ASPART 100 UNIT/ML IJ SOLN
0.0000 [IU] | Freq: Three times a day (TID) | INTRAMUSCULAR | Status: DC
  Administered 2024-01-06: 2 [IU] via SUBCUTANEOUS
  Administered 2024-01-06 – 2024-01-07 (×2): 1 [IU] via SUBCUTANEOUS
  Administered 2024-01-07 – 2024-01-08 (×3): 2 [IU] via SUBCUTANEOUS
  Administered 2024-01-08 – 2024-01-10 (×8): 1 [IU] via SUBCUTANEOUS
  Administered 2024-01-11: 2 [IU] via SUBCUTANEOUS
  Administered 2024-01-11: 1 [IU] via SUBCUTANEOUS

## 2024-01-05 NOTE — ED Provider Notes (Signed)
 [place] EMERGENCY DEPARTMENT AT [facility] Provider Note  CSN: [id number] Arrival date & time: 01/05/24 1241  Chief Complaint(s) No chief complaint on file.  HPI John [name] is a 89 y.o. male with past medical history as below, significant for CKD, COPD, SVT, TIA, subarachnoid hemorrhage, paroxysmal A-fib who presents to the ED with complaint of stroke alert  Patient with fall, found down at facility, right-sided weakness prior to fall, this was around 1100. No thinners. He has no dib or abd pain, no n/v. He has mild HA.  History somewhat limited secondary to dysarthria  Past Medical History Past Medical History:  Diagnosis Date   Anxiety    Arthritis    "lower back; left ankle" (03/20/2014)   ASTHMA    Blood transfusion    "related to OHS, ulcers"   CAD (coronary artery disease)    a. s/p 2V CABG 1996. b. Last cath 12/2016 -> patent grafts, med rx.   CAD, ARTERY BYPASS GRAFT    Cataract    CKD (chronic kidney disease), stage II    Colon polyps    adenomatous polyps   COPD    Depression    Diabetes mellitus without complication (HCC)    no meds now   Diverticulosis    Duodenal ulcer without hemorrhage or perforation 12/29/2011   GASTROESOPHAGEAL REFLUX DISEASE    HYPERLIPIDEMIA    Insomnia    Leukodystrophy    Memory loss    Nephrolithiasis    "I've had over 320 kidney stones" (03/20/2014)   NEPHROLITHIASIS, HX OF 03/12/2009   Parkinsonism    Sleep apnea    does not use CPAP   Stroke (HCC)    TIA   SUPRAVENTRICULAR TACHYCARDIA, HX OF    SVT (supraventricular tachycardia) 03/12/2009   TIA    Ulcer    Patient Active Problem List   Diagnosis Date Noted   ICH (intracerebral hemorrhage) (HCC) 01/05/2024   Left displaced femoral neck fracture (HCC) 12/01/2022   Fall 12/01/2022   Lactic acidosis 12/01/2022   Nodule of middle lobe of right lung 12/01/2022   Paroxysmal atrial fibrillation (HCC) 12/01/2022   Chronic diastolic CHF (congestive heart  failure) (HCC) 12/01/2022   GAD (generalized anxiety disorder) 12/01/2022   BPH (benign prostatic hyperplasia) 12/01/2022   COVID-19 virus infection 11/25/2021   History of intracranial hemorrhage 11/25/2021   Near syncope 11/25/2021   CAD (coronary artery disease)    Intracranial bleed (HCC) 01/15/2021   SAH (subarachnoid hemorrhage) (HCC)    Non-traumatic rhabdomyolysis    Primary osteoarthritis of left knee 09/26/2019   S/P left knee arthroscopy 09/26/2019   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HCC) 09/25/2019   Adrenal insufficiency (HCC) 09/25/2019   Acute medial meniscus tear of left knee 08/09/2019   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 anxiety 02/19/2018   Diabetic peripheral neuropathy (HCC) 11/21/2017   BMI 21.0-21.9, adult 09/04/2015   GERD (gastroesophageal reflux disease) 07/09/2015   Autonomic postural hypotension 02/06/2015   CKD stage G3a/A1, GFR 45-59 and albumin creatinine ratio <30 mg/g (HCC) 03/20/2014   Medication management 02/06/2014   Vitamin D deficiency 02/06/2014   Gastric AV malformation 10/30/2012   History of colonic polyps 09/14/2011   DM2 (diabetes mellitus, type 2) (HCC) 03/12/2009   HLD (hyperlipidemia) 03/12/2009   Essential hypertension 03/12/2009   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) 03/12/2009   History of TIA (transient ischemic attack) 03/12/2009   Asthma 03/12/2009   COPD (chronic obstructive pulmonary  disease) (HCC) 03/12/2009   GERD 03/12/2009   Home Medication(s) Prior to Admission medications   Medication Sig Start Date End Date Taking? Authorizing Provider  acetaminophen (PAIN RELIEF EXTRA STRENGTH) 500 MG tablet Take 2 tablets (1,000 mg total) by mouth every 6 (six) hours as needed (for pain). Patient taking differently: Take 1,000 mg by mouth in the morning, at noon, in the evening, and at bedtime. 12/03/21  Yes Rai, Ripudeep K, MD  amLODipine (NORVASC) 5 MG tablet Take 1 tablet (5 mg total)  by mouth 2 (two) times daily as needed (for standing BP > 160. Not to exceed 2 tablets/day.). 11/28/23  Yes Cranford, Archie Patten, NP  ascorbic acid (VITAMIN C) 500 MG tablet Take 1 tablet (500 mg total) by mouth daily. 11/28/23  Yes Cranford, Archie Patten, NP  benzonatate (TESSALON) 200 MG capsule Take 1 perle 3 x / day to prevent cough Patient taking differently: Take 200 mg by mouth in the morning, at noon, and at bedtime. 12/08/23  Yes Worthy Rancher B, FNP  Cholecalciferol 50 MCG (2000 UT) TABS Take 1  capsule  Daily Patient taking differently: Take 2,000 Units by mouth daily. 10/12/21  Yes [name], MD  escitalopram (LEXAPRO) 10 MG tablet Take 1 tablet Daily                               TAKE   BY                                MOUTH Patient taking differently: Take 10 mg by mouth daily. 11/28/23  Yes Cranford, Archie Patten, NP  fludrocortisone (FLORINEF) 0.1 MG tablet Take 0.1 mg by mouth 2 (two) times daily.   Yes [provider]  loperamide (IMODIUM) 2 MG capsule Take 2 capsules Daily for IBS - Diarrhea                                                                          /                                                                   TAKE                                         BY                                                 MOUTH Patient taking differently: Take 4 mg by mouth daily. 11/28/23  Yes Cranford, Archie Patten, NP  midodrine (PROAMATINE) 5 MG tablet Take 1 tablet 2 x /daily with Meals for Low BP Patient taking differently: Take 5 mg by  mouth 2 (two) times daily with a meal. 11/28/23  Yes Cranford, Tonya, NP  nitroGLYCERIN (NITROSTAT) 0.4 MG SL tablet Sig: 1 tablet under tongue every 3 to 5 minutes as needed for Angina - Please Dispense # 2 bottles of # 25 tabs 11/28/23  Yes Cranford, Tonya, NP  pantoprazole (PROTONIX) 40 MG tablet TAKE (1) TABLET BY MOUTH ONCE DAILY. **DO NOT CRUSH** 11/28/23  Yes Cranford, Tonya, NP  polyethylene glycol (MIRALAX / GLYCOLAX) 17 g packet Take 17 g by mouth daily as  needed for moderate constipation. 01/19/21  Yes Kathlen Mody, MD  promethazine-dextromethorphan (PROMETHAZINE-DM) 6.25-15 MG/5ML syrup Take 1 tsp every 4 hours if needed for cough Patient taking differently: Take 5 mLs by mouth every 4 (four) hours as needed for cough. 11/11/23  Yes [name], MD  QUEtiapine (SEROQUEL) 100 MG tablet Take 1 tablet (100 mg total) by mouth at bedtime. 11/28/23  Yes Cranford, Archie Patten, NP  QUEtiapine (SEROQUEL) 25 MG tablet Take  1 tablet 3 x /day before Meals                                                       /                                          TAKE                                        BY                                              MOUTH Patient taking differently: Take 25 mg by mouth 3 (three) times daily. 11/28/23  Yes Cranford, Archie Patten, NP  Sennosides-Docusate Sodium (SENNA PLUS PO) Take 1 tablet by mouth daily as needed (constipation).   Yes [provider]  tamsulosin (FLOMAX) 0.4 MG CAPS capsule TAKE (1) CAPSULE BY MOUTH DAILY AFTER SUPPER. 11/28/23  Yes Cranford, Tonya, NP  enoxaparin (LOVENOX) 40 MG/0.4ML injection Inject 0.4 mLs (40 mg total) into the skin daily for 14 days. Patient not taking: Reported on 01/05/2024 12/03/22 12/17/22  Tarry Kos, MD  glucose blood (FREESTYLE LITE) test strip TEST BLOOD SUGAR ONCE DAILY 06/01/19   [name], MD                                                                                                                                    Past  Surgical History Past Surgical History:  Procedure Laterality Date   ANKLE FRACTURE SURGERY Left ~ 2012   CARDIAC CATHETERIZATION     COLONOSCOPY     CORONARY ARTERY BYPASS GRAFT  1996   CABG X2   CYSTOSCOPY WITH URETEROSCOPY, STONE BASKETRY AND STENT PLACEMENT     Cystourethroscopy with stent removal.     EXCISIONAL HEMORRHOIDECTOMY     GASTRECTOMY     HIATAL HERNIA REPAIR     IRRIGATION AND DEBRIDEMENT SEBACEOUS CYST     "off my back   KNEE ARTHROSCOPY  WITH MEDIAL MENISECTOMY Left 08/24/2019   Procedure: LEFT KNEE ARTHROSCOPY WITH PARTIAL MEDIAL MENISCECTOMY;  Surgeon: Tarry Kos, MD;  Location: [facility];  Service: Orthopedics;  Laterality: Left;   LEFT HEART CATH AND CORS/GRAFTS ANGIOGRAPHY N/A 12/23/2016   Procedure: Left Heart Cath and Cors/Grafts Angiography;  Surgeon: Kathleene Hazel, MD;  Location: [facility] INVASIVE CV LAB;  Service: Cardiovascular;  Laterality: N/A;   POLYPECTOMY     PROSTATE SURGERY     "took the center of my prostate out"   TOTAL HIP ARTHROPLASTY Left 12/03/2022   Procedure: LEFT TOTAL HIP ARTHROPLASTY ANTERIOR APPROACH;  Surgeon: Tarry Kos, MD;  Location: [facility] OR;  Service: Orthopedics;  Laterality: Left;   UPPER GASTROINTESTINAL ENDOSCOPY     VAGOTOMY     Family History Family History  Problem Relation Age of Onset   Alcohol abuse Father    Stroke Mother    Heart disease Brother    Diabetes Brother    Heart disease Paternal Grandmother    Colon cancer Neg Hx    Stomach cancer Neg Hx    Esophageal cancer Neg Hx    Rectal cancer Neg Hx     Social History Social History   Tobacco Use   Smoking status: Former    Current packs/day: 0.00    Types: Cigarettes    Quit date: 10/25/1962    Years since quitting: 61.2   Smokeless tobacco: Former    Types: Chew    Quit date: 04/15/2018  Vaping Use   Vaping status: Never Used  Substance Use Topics   Alcohol use: No    Alcohol/week: 0.0 standard drinks of alcohol   Drug use: No   Allergies Gabapentin, Prozac [fluoxetine hcl], Soma [carisoprodol], and Beta adrenergic blockers  Review of Systems A thorough review of systems was obtained and all systems are negative except as noted in the HPI and PMH.   Physical Exam Vital Signs  I have reviewed the triage vital signs BP (!) 141/55   Pulse 71   Resp 19   SpO2 95%  Physical Exam Vitals and nursing note reviewed. Exam conducted with a chaperone present.  Constitutional:       Appearance: Normal appearance.  HENT:     Head: Normocephalic and atraumatic.     Right Ear: External ear normal.     Left Ear: External ear normal.     Nose: Nose normal.  Eyes:     General: No scleral icterus.       Right eye: No discharge.        Left eye: No discharge.  Cardiovascular:     Rate and Rhythm: Normal rate and regular rhythm.  Pulmonary:     Effort: Pulmonary effort is normal. No respiratory distress.  Abdominal:     General: Abdomen is flat.     Palpations: Abdomen is soft.     Tenderness: There is no abdominal  tenderness.  Musculoskeletal:     Cervical back: No rigidity.     Right lower leg: No edema.     Left lower leg: No edema.  Skin:    General: Skin is warm and dry.     Capillary Refill: Capillary refill takes less than 2 seconds.  Neurological:     Mental Status: He is alert and oriented to person, place, and time.     GCS: GCS eye subscore is 4. GCS verbal subscore is 5. GCS motor subscore is 6.     Cranial Nerves: Dysarthria present.     Sensory: Sensation is intact.     Motor: Weakness present.     Comments: Right-sided weakness, dysarthria, slight right facial droop. Difficult to complete coordination testing due to right-sided weakness Gait testing deferred secondary to patient safety.      ED Results and Treatments Labs (all labs ordered are listed, but only abnormal results are displayed) Labs Reviewed  CBC - Abnormal; Notable for the following components:      Result Value   RBC 3.77 (*)    Hemoglobin 12.8 (*)    HCT 36.9 (*)    All other components within normal limits  COMPREHENSIVE METABOLIC PANEL - Abnormal; Notable for the following components:   Potassium 2.6 (*)    Glucose, Bld 125 (*)    Creatinine, Ser 1.30 (*)    Calcium 8.4 (*)    Total Protein 6.0 (*)    Albumin 3.2 (*)    GFR, Estimated 53 (*)    All other components within normal limits  I-STAT CHEM 8, ED - Abnormal; Notable for the following components:    Potassium 2.7 (*)    Creatinine, Ser 1.40 (*)    Glucose, Bld 116 (*)    Calcium, Ion 1.04 (*)    Hemoglobin 12.6 (*)    HCT 37.0 (*)    All other components within normal limits  CBG MONITORING, ED - Abnormal; Notable for the following components:   Glucose-Capillary 121 (*)    All other components within normal limits  TROPONIN I (HIGH SENSITIVITY) - Abnormal; Notable for the following components:   Troponin I (High Sensitivity) 26 (*)    All other components within normal limits  PROTIME-INR  APTT  DIFFERENTIAL  ETHANOL  CK  MAGNESIUM  HEMOGLOBIN A1C  TROPONIN I (HIGH SENSITIVITY)                                                                                                                          Radiology CT HEAD CODE STROKE WO CONTRAST Addendum Date: 01/05/2024 ADDENDUM REPORT: 01/05/2024 13:31 ADDENDUM: Impression #1 discussed by telephone on 01/05/2024 at 1:30 pm with provider Dr. Amada Jupiter, who verbally acknowledged these results. Electronically Signed   By: Jackey Loge D.O.   On: 01/05/2024 13:31   Result Date: 01/05/2024 CLINICAL DATA:  Code stroke. Provided history: Neuro deficit, acute, stroke suspected. EXAM: CT HEAD WITHOUT CONTRAST TECHNIQUE: Contiguous axial  images were obtained from the base of the skull through the vertex without intravenous contrast. RADIATION DOSE REDUCTION: This exam was performed according to the departmental dose-optimization program which includes automated exposure control, adjustment of the mA and/or kV according to patient size and/or use of iterative reconstruction technique. COMPARISON:  Head CT 12/01/2022. FINDINGS: Brain: Generalized cerebral atrophy. 3.8 x 1.5 x 3.1 cm (9 mL) acute parenchymal hemorrhage centered within the left basal ganglia with surrounding edema. Focus of chronic encephalomalacia/gliosis again demonstrated within the right superior frontal gyrus from prior intraparenchymal hemorrhage. Background moderate to advanced  patchy and ill-defined hypoattenuation within the cerebral white matter, nonspecific but compatible chronic small vessel disease. Vascular: No hyperdense vessel.  Atherosclerotic calcifications. Skull: No calvarial fracture or aggressive osseous lesion. Sinuses/Orbits: No mass or acute finding within the imaged orbits. Mild mucosal thickening within the bilateral ethmoid, bilateral sphenoid and bilateral maxillary sinuses. Small right frontoethmoidal sinus osteoma. Attempts are being made to reach the ordering provider at this time. IMPRESSION: 1. 3.8 x 1.5 x 3.1 cm (9 mL) acute parenchymal hemorrhage centered within the left basal ganglia with surrounding edema. 2. Chronic encephalomalacia/gliosis again demonstrated within the right superior frontal gyrus from prior intraparenchymal hemorrhage. 3. Background parenchymal atrophy and chronic small vessel ischemic disease. 4. Paranasal sinus disease as described. Electronically Signed: By: Jackey Loge D.O. On: 01/05/2024 13:03   CT ANGIO HEAD NECK W WO CM (CODE STROKE) Result Date: 01/05/2024 CLINICAL DATA:  89 year old male code stroke presentation today with left lentiform hemorrhage. EXAM: CT ANGIOGRAPHY HEAD AND NECK TECHNIQUE: Multidetector CT imaging of the head and neck was performed using the standard protocol during bolus administration of intravenous contrast. Multiplanar CT image reconstructions and MIPs were obtained to evaluate the vascular anatomy. Carotid stenosis measurements (when applicable) are obtained utilizing NASCET criteria, using the distal internal carotid diameter as the denominator. RADIATION DOSE REDUCTION: This exam was performed according to the departmental dose-optimization program which includes automated exposure control, adjustment of the mA and/or kV according to patient size and/or use of iterative reconstruction technique. CONTRAST:  75mL OMNIPAQUE IOHEXOL 350 MG/ML SOLN COMPARISON:  Head CT 1251 hours. FINDINGS: CTA NECK  Skeleton: Previous cervical ACDF C5 through C7 with solid arthrodesis. Previous sternotomy. No acute osseous abnormality identified. Upper chest: Patchy and asymmetric but mostly dependent opacity in the visible right lung. Favor a combination of atelectasis and scarring. Visible major airways are patent. No superior mediastinal lymphadenopathy. Other neck: Trace retained secretions in the hypopharynx. Other neck soft tissue spaces are within normal limits. Aortic arch: Calcified aortic atherosclerosis. Tortuous aortic arch. Three vessel arch. Right carotid system: Brachiocephalic artery soft and calcified plaque, tortuosity without stenosis. Tortuous right CCA with medial soft plaque at the larynx, no stenosis. Calcified plaque at the right ICA origin and tortuosity distal to the bulb without stenosis. Left carotid system: Left CCA origin calcified plaque. Calcified plaque along the medial left ICA origin without stenosis. Vertebral arteries: Calcified right subclavian artery origin with less than 50 % stenosis with respect to the distal vessel. Right vertebral artery origin is normal. Tortuous right V1 segment. Mildly dominant right vertebral artery is patent to the skull base with no significant plaque or stenosis. Left subclavian artery origin, proximal soft and calcified plaque without stenosis. Left vertebral artery origin remains normal. Non dominant but fairly normal caliber left vertebral artery with tortuous V1 segment. No left vertebral stenosis to the skull base. CTA HEAD Posterior circulation: Mild bilateral distal vertebral artery plaque without stenosis.  Patent vertebrobasilar junction, PICA origins. Dominant right V4. Patent basilar artery without stenosis. Normal SCA and PCA origins. Both posterior communicating arteries are present. Bilateral PCA branches are within normal limits. Anterior circulation: Both ICA siphons are patent. Mild cavernous but moderate supraclinoid calcified ICA siphon plaque  bilaterally. Mild bilateral siphon tortuosity. Only mild supraclinoid siphon stenosis. Normal posterior communicating arteries. Patent carotid termini. Normal MCA and ACA origins. Dominant right A1. Normal anterior communicating artery. Bilateral ACA branches are within normal limits. Enhancing left lenticulostriate branches of the left MCA with no CTA spot sign identified. No abnormal vascularity identified in the region. Left MCA M1, trifurcation, left MCA branches are within normal limits. Right MCA M1 segment, bifurcation, right MCA branches are within normal limits. Venous sinuses: Patent. Anatomic variants: Dominant right vertebral artery. Dominant right ACA A1. Review of the MIP images confirms the above findings IMPRESSION: 1. Negative for large vessel occlusion, CTA spot sign, intracranial aneurysm, or AVM in association with the acute Left lentiform Hemorrhage. 2. Generalized arterial tortuosity in the head and neck with comparatively mild for age atherosclerosis. No hemodynamically significant arterial stenosis is identified. Aortic Atherosclerosis (ICD10-I70.0). 3. Patchy and asymmetric but mostly dependent opacity in the visible right lung, favor combination of atelectasis and scarring. Electronically Signed   By: Odessa Fleming M.D.   On: 01/05/2024 13:20    Pertinent labs & imaging results that were available during my care of the patient were reviewed by me and considered in my medical decision making (see MDM for details).  Medications Ordered in ED Medications  clevidipine (CLEVIPREX) infusion 0.5 mg/mL (4 mg/hr Intravenous Infusion Verify 01/05/24 1400)   stroke: early stages of recovery book (has no administration in time range)  acetaminophen (TYLENOL) tablet 650 mg (has no administration in time range)    Or  acetaminophen (TYLENOL) 160 MG/5ML solution 650 mg (has no administration in time range)    Or  acetaminophen (TYLENOL) suppository 650 mg (has no administration in time range)   senna-docusate (Senokot-S) tablet 1 tablet (0 tablets Oral Hold 01/05/24 1413)  pantoprazole (PROTONIX) injection 40 mg (has no administration in time range)  potassium chloride 10 mEq in 100 mL IVPB (10 mEq Intravenous New Bag/Given 01/05/24 1404)  magnesium sulfate IVPB 2 g 50 mL (2 g Intravenous New Bag/Given 01/05/24 1403)  insulin aspart (novoLOG) injection 0-9 Units (has no administration in time range)  sodium chloride flush (NS) 0.9 % injection 3 mL (3 mLs Intravenous Given 01/05/24 1406)  iohexol (OMNIPAQUE) 350 MG/ML injection 75 mL (75 mLs Intravenous Contrast Given 01/05/24 1259)  labetalol (NORMODYNE) injection 10 mg (10 mg Intravenous Given 01/05/24 1251)                                                                                                                                     Procedures .Critical Care  Performed by: Sloan Leiter, DO Authorized by: Sloan Leiter,  DO   Critical care provider statement:    Critical care time (minutes):  45   Critical care time was exclusive of:  Separately billable procedures and treating other patients   Critical care was necessary to treat or prevent imminent or life-threatening deterioration of the following conditions:  CNS failure or compromise   Critical care was time spent personally by me on the following activities:  Development of treatment plan with patient or surrogate, discussions with consultants, evaluation of patient's response to treatment, examination of patient, ordering and review of laboratory studies, ordering and review of radiographic studies, ordering and performing treatments and interventions, pulse oximetry, re-evaluation of patient's condition, review of old charts and obtaining history from patient or surrogate   Care discussed with: admitting provider     (including critical care time)  Medical Decision Making / ED Course    Medical Decision Making:    John [name] is a 89 y.o. male with past  medical history as below, significant for CKD, COPD, SVT, TIA, subarachnoid hemorrhage, paroxysmal A-fib who presents to the ED with complaint of stroke alert. The complaint involves an extensive differential diagnosis and also carries with it a high risk of complications and morbidity.  Serious etiology was considered. Ddx includes but is not limited to: CVA, TIA, ICH, electrolyte derangement, metabolic derangement, medication effect, intoxication, psychosomatic, etc.   Complete initial physical exam performed, notably the patient was in no acute distress, blood pressure stable, airway intact.    Reviewed and confirmed nursing documentation for past medical history, family history, social history.  Vital signs reviewed.    Clinical Course as of 01/05/24 1500  Thu Jan 05, 2024  1308 Left sided ICH noted on [facility] on my review, pending formal read   Radiology confirms left parenchymal hemorrhage  [SG]  1308 Spoke with Dr Amada Jupiter for admission [SG]    Clinical Course User Index [SG] Sloan Leiter, DO    Brief summary: 89 year old male history as above here with stroke alert.  Airway was cleared on arrival and patient was sent to CT with neurology/stroke team.  Patient noted to have right-sided weakness, dysarthria.  CT imaging concerning for ICH.  He is not anticoagulated.  Started on Cleviprex, plan admit to neurology/ICU  His potassium is low, will also check magnesium.  Replaced intravenously.  BP responding to cleviprex  He is no longer on [facility]  Discussed with patient and daughter at bedside who are agreeable to plan.  Given opportunity ask questions.  Admit neurology Dr Amada Jupiter                Additional history obtained: -Additional history obtained from family and ems -External records from outside source obtained and reviewed including: Chart review including previous notes, labs, imaging, consultation notes including  Home medications, prior admission, prior  imaging   Lab Tests: -I ordered, reviewed, and interpreted labs.   The pertinent results include:   Labs Reviewed  CBC - Abnormal; Notable for the following components:      Result Value   RBC 3.77 (*)    Hemoglobin 12.8 (*)    HCT 36.9 (*)    All other components within normal limits  COMPREHENSIVE METABOLIC PANEL - Abnormal; Notable for the following components:   Potassium 2.6 (*)    Glucose, Bld 125 (*)    Creatinine, Ser 1.30 (*)    Calcium 8.4 (*)    Total Protein 6.0 (*)    Albumin 3.2 (*)  GFR, Estimated 53 (*)    All other components within normal limits  I-STAT CHEM 8, ED - Abnormal; Notable for the following components:   Potassium 2.7 (*)    Creatinine, Ser 1.40 (*)    Glucose, Bld 116 (*)    Calcium, Ion 1.04 (*)    Hemoglobin 12.6 (*)    HCT 37.0 (*)    All other components within normal limits  CBG MONITORING, ED - Abnormal; Notable for the following components:   Glucose-Capillary 121 (*)    All other components within normal limits  TROPONIN I (HIGH SENSITIVITY) - Abnormal; Notable for the following components:   Troponin I (High Sensitivity) 26 (*)    All other components within normal limits  PROTIME-INR  APTT  DIFFERENTIAL  ETHANOL  CK  MAGNESIUM  HEMOGLOBIN A1C  TROPONIN I (HIGH SENSITIVITY)    Notable for K is low, replace  EKG   EKG Interpretation Date/Time:  Thursday January 05 2024 14:41:49 EDT Ventricular Rate:  73 PR Interval:  254 QRS Duration:  100 QT Interval:  428 QTC Calculation: 472 R Axis:   21  Text Interpretation: Sinus rhythm Atrial premature complex Prolonged PR interval Abnormal R-wave progression, early transition Probable LVH with secondary repol abnrm similar to prior likely lvh no stemi Confirmed by Tanda Rockers ([id number]) on 01/05/2024 2:58:07 PM         Imaging Studies ordered: I ordered imaging studies including CTH CTA CXR I independently visualized the following imaging with scope of interpretation limited to  determining acute life threatening conditions related to emergency care; findings noted above I independently visualized and interpreted imaging. I agree with the radiologist interpretation   Medicines ordered and prescription drug management: Meds ordered this encounter  Medications   sodium chloride flush (NS) 0.9 % injection 3 mL   iohexol (OMNIPAQUE) 350 MG/ML injection 75 mL   clevidipine (CLEVIPREX) infusion 0.5 mg/mL    stroke: early stages of recovery book   OR Linked Order Group    acetaminophen (TYLENOL) tablet 650 mg    acetaminophen (TYLENOL) 160 MG/5ML solution 650 mg    acetaminophen (TYLENOL) suppository 650 mg   senna-docusate (Senokot-S) tablet 1 tablet   pantoprazole (PROTONIX) injection 40 mg   labetalol (NORMODYNE) injection 10 mg   potassium chloride 10 mEq in 100 mL IVPB   magnesium sulfate IVPB 2 g 50 mL   insulin aspart (novoLOG) injection 0-9 Units    Correction coverage::   Sensitive (thin, NPO, renal)    CBG < 70::   Implement Hypoglycemia Standing Orders and refer to Hypoglycemia Standing Orders sidebar report    CBG 70 - 120::   0 units    CBG 121 - 150::   1 unit    CBG 151 - 200::   2 units    CBG 201 - 250::   3 units    CBG 251 - 300::   5 units    CBG 301 - 350::   7 units    CBG 351 - 400:   9 units    CBG > 400:   call MD and obtain STAT lab verification    -I have reviewed the patients home medicines and have made adjustments as needed   Consultations Obtained: I requested consultation with the neurology/stroke team,  and discussed lab and imaging findings as well as pertinent plan   Cardiac Monitoring: The patient was maintained on a cardiac monitor.  I personally viewed and interpreted the cardiac monitored  which showed an underlying rhythm of: NSR Continuous pulse oximetry interpreted by myself, 97% on RA.    Social Determinants of Health:  Diagnosis or treatment significantly limited by social determinants of health: former  smoker   Reevaluation: After the interventions noted above, I reevaluated the patient and found that they have stayed the same  Co morbidities that complicate the patient evaluation  Past Medical History:  Diagnosis Date   Anxiety    Arthritis    "lower back; left ankle" (03/20/2014)   ASTHMA    Blood transfusion    "related to OHS, ulcers"   CAD (coronary artery disease)    a. s/p 2V CABG 1996. b. Last cath 12/2016 -> patent grafts, med rx.   CAD, ARTERY BYPASS GRAFT    Cataract    CKD (chronic kidney disease), stage II    Colon polyps    adenomatous polyps   COPD    Depression    Diabetes mellitus without complication (HCC)    no meds now   Diverticulosis    Duodenal ulcer without hemorrhage or perforation 12/29/2011   GASTROESOPHAGEAL REFLUX DISEASE    HYPERLIPIDEMIA    Insomnia    Leukodystrophy    Memory loss    Nephrolithiasis    "I've had over 320 kidney stones" (03/20/2014)   NEPHROLITHIASIS, HX OF 03/12/2009   Parkinsonism    Sleep apnea    does not use CPAP   Stroke (HCC)    TIA   SUPRAVENTRICULAR TACHYCARDIA, HX OF    SVT (supraventricular tachycardia) 03/12/2009   TIA    Ulcer       Dispostion: Disposition decision including need for hospitalization was considered, and patient admitted to the hospital.    Final Clinical Impression(s) / ED Diagnoses Final diagnoses:  Left-sided nontraumatic intracerebral hemorrhage, unspecified cerebral location (HCC)        Sloan Leiter, DO 01/05/24 1500

## 2024-01-05 NOTE — H&P (Signed)
 NEUROLOGY H&P NOTE   Date of service: January 05, 2024 Patient Name: John [name] MRN:  [id number] DOB:  09-May-1935 Chief Complaint: "Right-sided weakness"  History of Present Illness  John [name] is a 89 y.o. male with hx of A-fib not on anticoagulation, CAD, CKD stage II, DM, hyperlipidemia who presents with right-sided weakness that started abruptly.  He was in his normal state of health when he woke up this morning, but got up to go to the bathroom around 11 AM and when he did so, he suddenly developed right-sided weakness that caused him to fall.  He does not think that he hit his head.   Last known well: 11 AM ICH Score: 1 tNKASE: Not offered due to ICH Thrombectomy: not offered due to ICH NIHSS components Score: Comment  1a Level of Conscious 0[x]  1[]  2[]  3[]      1b LOC Questions 0[]  1[x]  2[]       1c LOC Commands 0[x]  1[]  2[]       2 Best Gaze 0[x]  1[]  2[]       3 Visual 0[x]  1[]  2[]  3[]      4 Facial Palsy 0[]  1[x]  2[]  3[]      5a Motor Arm - left 0[x]  1[]  2[]  3[]  4[]  UN[]    5b Motor Arm - Right 0[]  1[x]  2[]  3[]  4[]  UN[]    6a Motor Leg - Left 0[x]  1[]  2[]  3[]  4[]  UN[]    6b Motor Leg - Right 0[]  1[]  2[x]  3[]  4[]  UN[]    7 Limb Ataxia 0[x]  1[]  2[]  3[]  UN[]     8 Sensory 0[x]  1[]  2[]  UN[]      9 Best Language 0[]  1[x]  2[]  3[]      10 Dysarthria 0[]  1[]  2[x]  UN[]      11 Extinct. and Inattention 0[x]  1[]  2[]       TOTAL: 8      Past History   Past Medical History:  Diagnosis Date   Anxiety    Arthritis    "lower back; left ankle" (03/20/2014)   ASTHMA    Blood transfusion    "related to OHS, ulcers"   CAD (coronary artery disease)    a. s/p 2V CABG 1996. b. Last cath 12/2016 -> patent grafts, med rx.   CAD, ARTERY BYPASS GRAFT    Cataract    CKD (chronic kidney disease), stage II    Colon polyps    adenomatous polyps   COPD    Depression    Diabetes mellitus without complication (HCC)    no meds now   Diverticulosis    Duodenal ulcer without hemorrhage or  perforation 12/29/2011   GASTROESOPHAGEAL REFLUX DISEASE    HYPERLIPIDEMIA    Insomnia    Leukodystrophy    Memory loss    Nephrolithiasis    "I've had over 320 kidney stones" (03/20/2014)   NEPHROLITHIASIS, HX OF 03/12/2009   Parkinsonism    Sleep apnea    does not use CPAP   Stroke (HCC)    TIA   SUPRAVENTRICULAR TACHYCARDIA, HX OF    SVT (supraventricular tachycardia) 03/12/2009   TIA    Ulcer    Past Surgical History:  Procedure Laterality Date   ANKLE FRACTURE SURGERY Left ~ 2012   CARDIAC CATHETERIZATION     COLONOSCOPY     CORONARY ARTERY BYPASS GRAFT  1996   CABG X2   CYSTOSCOPY WITH URETEROSCOPY, STONE BASKETRY AND STENT PLACEMENT     Cystourethroscopy with stent removal.     EXCISIONAL HEMORRHOIDECTOMY     GASTRECTOMY  HIATAL HERNIA REPAIR     IRRIGATION AND DEBRIDEMENT SEBACEOUS CYST     "off my back   KNEE ARTHROSCOPY WITH MEDIAL MENISECTOMY Left 08/24/2019   Procedure: LEFT KNEE ARTHROSCOPY WITH PARTIAL MEDIAL MENISCECTOMY;  Surgeon: Tarry Kos, MD;  Location: [facility];  Service: Orthopedics;  Laterality: Left;   LEFT HEART CATH AND CORS/GRAFTS ANGIOGRAPHY N/A 12/23/2016   Procedure: Left Heart Cath and Cors/Grafts Angiography;  Surgeon: Kathleene Hazel, MD;  Location: [facility] INVASIVE CV LAB;  Service: Cardiovascular;  Laterality: N/A;   POLYPECTOMY     PROSTATE SURGERY     "took the center of my prostate out"   TOTAL HIP ARTHROPLASTY Left 12/03/2022   Procedure: LEFT TOTAL HIP ARTHROPLASTY ANTERIOR APPROACH;  Surgeon: Tarry Kos, MD;  Location: [facility] OR;  Service: Orthopedics;  Laterality: Left;   UPPER GASTROINTESTINAL ENDOSCOPY     VAGOTOMY     Family History  Problem Relation Age of Onset   Alcohol abuse Father    Stroke Mother    Heart disease Brother    Diabetes Brother    Heart disease Paternal Grandmother    Colon cancer Neg Hx    Stomach cancer Neg Hx    Esophageal cancer Neg Hx    Rectal cancer Neg Hx    Social History    Socioeconomic History   Marital status: Widowed    Spouse name: Not on file   Number of children: 2   Years of education: 14   Highest education level: Not on file  Occupational History   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  Tobacco Use   Smoking status: Former    Current packs/day: 0.00    Types: Cigarettes    Quit date: 10/25/1962    Years since quitting: 61.2   Smokeless tobacco: Former    Types: Chew    Quit date: 04/15/2018  Vaping Use   Vaping status: Never Used  Substance and Sexual Activity   Alcohol use: No    Alcohol/week: 0.0 standard drinks of alcohol   Drug use: No   Sexual activity: Yes  Other Topics Concern   Not on file  Social History Narrative   Denies caffeine use    Social Drivers of Corporate investment banker Strain: Not on file  Food Insecurity: No Food Insecurity (12/07/2022)   Hunger Vital Sign    Worried About Running Out of Food in the Last Year: Never true    Ran Out of Food in the Last Year: Never true  Transportation Needs: No Transportation Needs (12/07/2022)   PRAPARE - Administrator, Civil Service (Medical): No    Lack of Transportation (Non-Medical): No  Physical Activity: Not on file  Stress: Not on file  Social Connections: Unknown (03/07/2022)   Received from [facility]   Social Network    Social Network: Not on file   Allergies  Allergen Reactions   Gabapentin Other (See Comments)    Severe confusion   Prozac [Fluoxetine Hcl] Other (See Comments)    Patient state that it does not agree with him   Herbalist [Carisoprodol] Other (See Comments)    Patient stated that it does not agree with him   Beta Adrenergic Blockers Other (See Comments)    REACTION: Bradycardia    Medications  (Not in a hospital admission)    Vitals   There were no vitals filed for this visit.   There  is no height or weight on file to calculate BMI.  Physical Exam   Constitutional:  Appears well-developed and well-nourished.  Psych: Affect appropriate to situation.  Eyes: No scleral injection.  HENT: No OP obstruction.  Head: Normocephalic.  Cardiovascular: Normal rate and irregular rhythm.  Respiratory: Effort normal, non-labored breathing.  GI: Soft.  No distension. There is no tenderness.  Skin: WDI.   Neurologic Examination   Neuro: Mental Status: Patient is awake, alert, oriented to person, place, month, he is not able to tell me his age correctly He is very dysarthric, but he also appears to have some difficulty with word finding Cranial Nerves: II: Visual Fields are full. Pupils are equal, round, and reactive to light.   III,IV, VI: EOMI without ptosis or diploplia.  V: Facial sensation is symmetric to temperature VII: Facial movement is weak on the right VIII: hearing is intact to voice X: Uvula elevates symmetrically XII: tongue is midline without atrophy or fasciculations.  Motor: Tone is normal. Bulk is normal. 5/5 strength was present on the left, he has 4/5 weakness of the right arm and 4-/5 weakness of the right leg Sensory: Sensation is symmetric to light touch and temperature in the arms and legs. Cerebellar: No clear ataxia out of proportion to weakness       Labs   CBC:  Recent Labs  Lab 01/05/24 1247 01/05/24 1251  WBC 4.6  --   NEUTROABS 2.5  --   HGB 12.8* 12.6*  HCT 36.9* 37.0*  MCV 97.9  --   PLT 227  --     Basic Metabolic Panel:  Lab Results  Component Value Date   NA 143 01/05/2024   K 2.7 (LL) 01/05/2024   CO2 28 12/30/2022   GLUCOSE 116 (H) 01/05/2024   BUN 16 01/05/2024   CREATININE 1.40 (H) 01/05/2024   CALCIUM 8.5 (L) 12/30/2022   GFRNONAA >60 12/07/2022   GFRAA 61 07/24/2020   Lipid Panel:  Lab Results  Component Value Date   LDLCALC 90 07/24/2020   HgbA1c:  Lab Results  Component Value Date   HGBA1C 5.4 12/02/2022   Urine Drug Screen:     Component Value Date/Time   LABOPIA NONE  DETECTED 01/16/2021 0005   COCAINSCRNUR NONE DETECTED 01/16/2021 0005   LABBENZ POSITIVE (A) 01/16/2021 0005   AMPHETMU NONE DETECTED 01/16/2021 0005   THCU NONE DETECTED 01/16/2021 0005   LABBARB NONE DETECTED 01/16/2021 0005    Alcohol Level     Component Value Date/Time   ETH <10 12/01/2022 2311   INR  Lab Results  Component Value Date   INR 1.1 01/05/2024   APTT  Lab Results  Component Value Date   APTT 24 01/05/2024     CT Head without contrast(Personally reviewed): Subcortical hemorrhage on the left  Impression   John [name] is a 89 y.o. male with subcortical hemorrhage on the left, most likely hypertensive in etiology.  He will need to be admitted to the intensive care unit for close monitoring and aggressive blood pressure control.  He has been previously listed as DNR, I reconfirmed with him that he would want neither resuscitation in the setting of cardiac arrest nor intubation in the setting of not being able to breathe on his own.  Primary Diagnosis:  Nontraumatic intracerebral hemorrhage in hemisphere, subcortical  Secondary Diagnosis: Essential (primary) hypertension, Hypertension Emergency (SBP > 180 or DBP > 120 & end organ damage), Type 2 diabetes mellitus w/o complications, and CKD Stage  2 (GFR 60-89), hypokalemia  Recommendations  1) Admit to ICU 2) no antiplatelets or anticoagulants 3) blood pressure control with goal systolic 130 - 150 4) Frequent neuro checks 5) If symptoms worsen or there is decreased mental status, repeat stat head CT 6) PT,OT,ST 7) ssi for dm 8) Cleviprex for hypertension ______________________________________________________________________  This patient is critically ill and at significant risk of neurological worsening, death and care requires constant monitoring of vital signs, hemodynamics,respiratory and cardiac monitoring, neurological assessment, discussion with family, other specialists and medical decision  making of high complexity. I spent 40 minutes of neurocritical care time  in the care of  this patient. This was time spent independent of any time provided by nurse practitioner or PA.  Ritta Slot, MD [facility]   If 7pm- 7am, please page neurology on call as listed in AMION. 01/05/2024  2:02 PM

## 2024-01-05 NOTE — ED Notes (Signed)
Echo @ bedside

## 2024-01-05 NOTE — ED Notes (Signed)
 Dinner tray will be up in 45 min.

## 2024-01-05 NOTE — ED Notes (Signed)
 PT is able to raise his right leg and hold it now but was unable to upon arrival. PT did well with swallow screen , no coughing. PT's language is still aphasic.

## 2024-01-05 NOTE — ED Notes (Signed)
 ED TO INPATIENT HANDOFF REPORT  ED Nurse Name and Phone #: Rodney Booze [phone]  S Name/Age/Gender John [name] 89 y.o. male Room/Bed: 006C/006C  Code Status   Code Status: Limited: Do not attempt resuscitation (DNR) -DNR-LIMITED -Do Not Intubate/DNI   Home/SNF/Other Nursing Home [facility] Patient oriented to: self,place Is this baseline? Yes   Triage Complete: Triage complete  Chief Complaint ICH (intracerebral hemorrhage) (HCC) [I61.9]  Triage Note No notes on file   Allergies Allergies  Allergen Reactions   Gabapentin Other (See Comments)    Severe confusion   Prozac [Fluoxetine Hcl] Other (See Comments)    Patient state that it does not agree with him   Soma [Carisoprodol] Other (See Comments)    Patient stated that it does not agree with him   Beta Adrenergic Blockers Other (See Comments)    REACTION: Bradycardia    Level of Care/Admitting Diagnosis ED Disposition     ED Disposition  Admit   Condition  --   Comment  Hospital Area: [facility] [[id number]]  Level of Care: ICU [6]  May admit patient to [facility] or [name] if equivalent level of care is available:: No  Covid Evaluation: Confirmed COVID Negative  Diagnosis: ICH (intracerebral hemorrhage) [facility]) [[id number]]  Admitting Physician: Rejeana Brock [[id number]]  Attending Physician: Amada Jupiter, MCNEILL P [[id number]]  Certification:: I certify this patient will need inpatient services for at least 2 midnights  Expected Medical Readiness: 01/06/2024          B Medical/Surgery History Past Medical History:  Diagnosis Date   Anxiety    Arthritis    "lower back; left ankle" (03/20/2014)   ASTHMA    Blood transfusion    "related to OHS, ulcers"   CAD (coronary artery disease)    a. s/p 2V CABG 1996. b. Last cath 12/2016 -> patent grafts, med rx.   CAD, ARTERY BYPASS GRAFT    Cataract    CKD (chronic kidney disease), stage II    Colon polyps    adenomatous  polyps   COPD    Depression    Diabetes mellitus without complication (HCC)    no meds now   Diverticulosis    Duodenal ulcer without hemorrhage or perforation 12/29/2011   GASTROESOPHAGEAL REFLUX DISEASE    HYPERLIPIDEMIA    Insomnia    Leukodystrophy (HCC)    Memory loss    Nephrolithiasis    "I've had over 320 kidney stones" (03/20/2014)   NEPHROLITHIASIS, HX OF 03/12/2009   Parkinsonism (HCC)    Sleep apnea    does not use CPAP   Stroke (HCC)    TIA   SUPRAVENTRICULAR TACHYCARDIA, HX OF    SVT (supraventricular tachycardia) (HCC) 03/12/2009   TIA    Ulcer    Past Surgical History:  Procedure Laterality Date   ANKLE FRACTURE SURGERY Left ~ 2012   CARDIAC CATHETERIZATION     COLONOSCOPY     CORONARY ARTERY BYPASS GRAFT  1996   CABG X2   CYSTOSCOPY WITH URETEROSCOPY, STONE BASKETRY AND STENT PLACEMENT     Cystourethroscopy with stent removal.     EXCISIONAL HEMORRHOIDECTOMY     GASTRECTOMY     HIATAL HERNIA REPAIR     IRRIGATION AND DEBRIDEMENT SEBACEOUS CYST     "off my back   KNEE ARTHROSCOPY WITH MEDIAL MENISECTOMY Left 08/24/2019   Procedure: LEFT KNEE ARTHROSCOPY WITH PARTIAL MEDIAL MENISCECTOMY;  Surgeon: Tarry Kos, MD;  Location: [facility];  Service: Orthopedics;  Laterality: Left;   LEFT HEART CATH AND CORS/GRAFTS ANGIOGRAPHY N/A 12/23/2016   Procedure: Left Heart Cath and Cors/Grafts Angiography;  Surgeon: Kathleene Hazel, MD;  Location: [facility] INVASIVE CV LAB;  Service: Cardiovascular;  Laterality: N/A;   POLYPECTOMY     PROSTATE SURGERY     "took the center of my prostate out"   TOTAL HIP ARTHROPLASTY Left 12/03/2022   Procedure: LEFT TOTAL HIP ARTHROPLASTY ANTERIOR APPROACH;  Surgeon: Tarry Kos, MD;  Location: [facility] OR;  Service: Orthopedics;  Laterality: Left;   UPPER GASTROINTESTINAL ENDOSCOPY     VAGOTOMY       A IV Location/Drains/Wounds Patient Lines/Drains/Airways Status     Active Line/Drains/Airways     Name Placement date  Placement time Site Days   Peripheral IV 01/05/24 18 G Left Antecubital 01/05/24  --  Antecubital  less than 1   Peripheral IV 01/05/24 20 G Right Antecubital 01/05/24  1245  Antecubital  less than 1   Wound / Incision (Open or Dehisced) 12/01/22 Skin tear Elbow Left;Posterior 12/01/22  2300  Elbow  400   Wound / Incision (Open or Dehisced) 12/01/22 Laceration Head Posterior;Right 12/01/22  2300  Head  400   Wound / Incision (Open or Dehisced) 12/01/22 Laceration Left 12/01/22  2300  --  400            Intake/Output Last 24 hours  Intake/Output Summary (Last 24 hours) at 01/05/2024 1802 Last data filed at 01/05/2024 1720 Gross per 24 hour  Intake 106.26 ml  Output --  Net 106.26 ml    Labs/Imaging Results for orders placed or performed during the hospital encounter of 01/05/24 (from the past 48 hours)  CBG monitoring, ED     Status: Abnormal   Collection Time: 01/05/24 12:43 PM  Result Value Ref Range   Glucose-Capillary 121 (H) 70 - 99 mg/dL    Comment: Glucose reference range applies only to samples taken after fasting for at least 8 hours.  Protime-INR     Status: None   Collection Time: 01/05/24 12:47 PM  Result Value Ref Range   Prothrombin Time 14.1 11.4 - 15.2 seconds   INR 1.1 0.8 - 1.2    Comment: (NOTE) INR goal varies based on device and disease states. Performed at [facility], [address], [place], [place] [address]   APTT     Status: None   Collection Time: 01/05/24 12:47 PM  Result Value Ref Range   aPTT 24 24 - 36 seconds    Comment: Performed at [facility], [address], [place], [place] [address]  CBC     Status: Abnormal   Collection Time: 01/05/24 12:47 PM  Result Value Ref Range   WBC 4.6 4.0 - 10.5 K/uL   RBC 3.77 (L) 4.22 - 5.81 MIL/uL   Hemoglobin 12.8 (L) 13.0 - 17.0 g/dL   HCT 56.2 (L) 13.0 - 86.5 %   MCV 97.9 80.0 - 100.0 fL   MCH 34.0 26.0 - 34.0 pg   MCHC 34.7 30.0 - 36.0 g/dL   RDW 78.4 69.6 - 29.5 %   Platelets  227 150 - 400 K/uL   nRBC 0.0 0.0 - 0.2 %    Comment: Performed at [facility], [address] [place], [place] [address]  Differential     Status: None   Collection Time: 01/05/24 12:47 PM  Result Value Ref Range   Neutrophils Relative %  54 %   Neutro Abs 2.5 1.7 - 7.7 K/uL   Lymphocytes Relative 33 %   Lymphs Abs 1.5 0.7 - 4.0 K/uL   Monocytes Relative 8 %   Monocytes Absolute 0.4 0.1 - 1.0 K/uL   Eosinophils Relative 4 %   Eosinophils Absolute 0.2 0.0 - 0.5 K/uL   Basophils Relative 1 %   Basophils Absolute 0.0 0.0 - 0.1 K/uL   Immature Granulocytes 0 %   Abs Immature Granulocytes 0.02 0.00 - 0.07 K/uL    Comment: Performed at [facility], [address], [place], [place] [address]  Comprehensive metabolic panel     Status: Abnormal   Collection Time: 01/05/24 12:47 PM  Result Value Ref Range   Sodium 142 135 - 145 mmol/L   Potassium 2.6 (LL) 3.5 - 5.1 mmol/L    Comment: CRITICAL RESULT CALLED TO, READ BACK BY AND VERIFIED WITH T. MORRISON, RN AT 1331 03.13.25 D. BLU   Chloride 104 98 - 111 mmol/L   CO2 29 22 - 32 mmol/L   Glucose, Bld 125 (H) 70 - 99 mg/dL    Comment: Glucose reference range applies only to samples taken after fasting for at least 8 hours.   BUN 14 8 - 23 mg/dL   Creatinine, Ser 9.81 (H) 0.61 - 1.24 mg/dL   Calcium 8.4 (L) 8.9 - 10.3 mg/dL   Total Protein 6.0 (L) 6.5 - 8.1 g/dL   Albumin 3.2 (L) 3.5 - 5.0 g/dL   AST 18 15 - 41 U/L   ALT 10 0 - 44 U/L   Alkaline Phosphatase 56 38 - 126 U/L   Total Bilirubin 0.8 0.0 - 1.2 mg/dL   GFR, Estimated 53 (L) >60 mL/min    Comment: (NOTE) Calculated using the CKD-EPI Creatinine Equation (2021)    Anion gap 9 5 - 15    Comment: Performed at [facility], [address], [place], [place] [address]  Ethanol     Status: None   Collection Time: 01/05/24 12:47 PM  Result Value Ref Range   Alcohol, Ethyl (B) <10 <10 mg/dL    Comment: (NOTE) Lowest detectable limit for serum alcohol is 10  mg/dL.  For medical purposes only. Performed at [facility], [address], [place], [place] [address]   CK     Status: None   Collection Time: 01/05/24 12:47 PM  Result Value Ref Range   Total CK 117 49 - 397 U/L    Comment: Performed at [facility], [address] [place], [place] [address]  Troponin I (High Sensitivity)     Status: Abnormal   Collection Time: 01/05/24 12:47 PM  Result Value Ref Range   Troponin I (High Sensitivity) 26 (H) <18 ng/L    Comment: (NOTE) Elevated high sensitivity troponin I (hsTnI) values and significant  changes across serial measurements may suggest ACS but many other  chronic and acute conditions are known to elevate hsTnI results.  Refer to the "Links" section for chest pain algorithms and additional  guidance. Performed at [facility], [address], [place], [place] [address]   Magnesium     Status: None   Collection Time: 01/05/24 12:47 PM  Result Value Ref Range   Magnesium 2.0 1.7 - 2.4 mg/dL    Comment: Performed at [facility], [address] [place], [place] [address]  I-stat chem 8, ED     Status: Abnormal   Collection  Time: 01/05/24 12:51 PM  Result Value Ref Range   Sodium 143 135 - 145 mmol/L   Potassium 2.7 (LL) 3.5 - 5.1 mmol/L   Chloride 102 98 - 111 mmol/L   BUN 16 8 - 23 mg/dL   Creatinine, Ser 1.61 (H) 0.61 - 1.24 mg/dL   Glucose, Bld 096 (H) 70 - 99 mg/dL    Comment: Glucose reference range applies only to samples taken after fasting for at least 8 hours.   Calcium, Ion 1.04 (L) 1.15 - 1.40 mmol/L   TCO2 32 22 - 32 mmol/L   Hemoglobin 12.6 (L) 13.0 - 17.0 g/dL   HCT 04.5 (L) 40.9 - 81.1 %   Comment NOTIFIED PHYSICIAN   CBG monitoring, ED     Status: Abnormal   Collection Time: 01/05/24  5:22 PM  Result Value Ref Range   Glucose-Capillary 135 (H) 70 - 99 mg/dL    Comment: Glucose reference range applies only to samples taken after fasting for at least 8 hours.   ECHOCARDIOGRAM  COMPLETE Result Date: 01/05/2024    ECHOCARDIOGRAM REPORT   Patient Name:   MCCLELLAN DEMARAIS Date of Exam: 01/05/2024 Medical Rec #:  [id number]         Height:       72.0 in Accession #:    [id number]        Weight:       182.0 lb Date of Birth:  1934-12-05         BSA:          2.047 m Patient Age:    89 years          BP:           141/55 mmHg Patient Gender: M                 HR:           76 bpm. Exam Location:  Inpatient Procedure: 2D Echo, 3D Echo, Cardiac Doppler, Color Doppler, Strain Analysis and            Intracardiac Opacification Agent (Both Spectral and Color Flow            Doppler were utilized during procedure). Indications:    Stroke  History:        Patient has prior history of Echocardiogram examinations, most                 recent 11/12/2018.  Sonographer:    Karma Ganja Referring Phys: [id number] MCNEILL P KIRKPATRICK  Sonographer Comments: Global longitudinal strain was attempted. IMPRESSIONS  1. Left ventricular ejection fraction, by estimation, is 60 to 65%. Left ventricular ejection fraction by 3D volume is 59 %. The left ventricle has normal function. The left ventricle has no regional wall motion abnormalities. Left ventricular diastolic  parameters are consistent with Grade I diastolic dysfunction (impaired relaxation).  2. Right ventricular systolic function is normal. The right ventricular size is moderately enlarged.  3. Left atrial size was moderately dilated.  4. The mitral valve is normal in structure. No evidence of mitral valve regurgitation. No evidence of mitral stenosis.  5. The aortic valve is tricuspid. Aortic valve regurgitation is moderate. Aortic valve sclerosis/calcification is present, without any evidence of aortic stenosis. Aortic regurgitation PHT measures 325 msec. Aortic valve area, by VTI measures 2.98 cm. Aortic valve mean gradient measures 7.0 mmHg. Aortic valve Vmax measures 1.75 m/s.  6. Aortic dilatation noted. There is mild dilatation of the aortic root,  measuring 38 mm.  7. The inferior vena cava is normal in size with greater than 50% respiratory variability, suggesting right atrial pressure of 3 mmHg. Conclusion(s)/Recommendation(s): No intracardiac source of embolism detected on this transthoracic study. Consider a transesophageal echocardiogram to exclude cardiac source of embolism if clinically indicated. FINDINGS  Left Ventricle: Left ventricular ejection fraction, by estimation, is 60 to 65%. Left ventricular ejection fraction by 3D volume is 59 %. The left ventricle has normal function. The left ventricle has no regional wall motion abnormalities. Definity contrast agent was given IV to delineate the left ventricular endocardial borders. Global longitudinal strain performed but not reported based on interpreter judgement due to suboptimal tracking. The left ventricular internal cavity size was normal in size. There is no left ventricular hypertrophy. Left ventricular diastolic parameters are consistent with Grade I diastolic dysfunction (impaired relaxation). Normal left ventricular filling pressure. Right Ventricle: The right ventricular size is moderately enlarged. No increase in right ventricular wall thickness. Right ventricular systolic function is normal. Left Atrium: Left atrial size was moderately dilated. Right Atrium: Right atrial size was normal in size. Pericardium: There is no evidence of pericardial effusion. Mitral Valve: The mitral valve is normal in structure. No evidence of mitral valve regurgitation. No evidence of mitral valve stenosis. Tricuspid Valve: The tricuspid valve is normal in structure. Tricuspid valve regurgitation is not demonstrated. No evidence of tricuspid stenosis. Aortic Valve: The aortic valve is tricuspid. Aortic valve regurgitation is moderate. Aortic regurgitation PHT measures 325 msec. Aortic valve sclerosis/calcification is present, without any evidence of aortic stenosis. Aortic valve mean gradient measures  7.0  mmHg. Aortic valve peak gradient measures 12.2 mmHg. Aortic valve area, by VTI measures 2.98 cm. Pulmonic Valve: The pulmonic valve was normal in structure. Pulmonic valve regurgitation is not visualized. No evidence of pulmonic stenosis. Aorta: Aortic dilatation noted. There is mild dilatation of the aortic root, measuring 38 mm. Venous: The inferior vena cava is normal in size with greater than 50% respiratory variability, suggesting right atrial pressure of 3 mmHg. IAS/Shunts: No atrial level shunt detected by color flow Doppler. Additional Comments: 3D was performed not requiring image post processing on an independent workstation and was normal.  LEFT VENTRICLE PLAX 2D LVIDd:         4.95 cm         Diastology LVIDs:         3.25 cm         LV e' medial:    7.18 cm/s LV PW:         1.00 cm         LV E/e' medial:  10.4 LV IVS:        1.55 cm         LV e' lateral:   6.53 cm/s LVOT diam:     2.10 cm         LV E/e' lateral: 11.5 LV SV:         109 LV SV Index:   53              2D Longitudinal LVOT Area:     3.46 cm        Strain                                2D Strain GLS   -14.0 %                                (  A4C):                                2D Strain GLS   -15.6 %                                (A3C):                                2D Strain GLS   -15.1 %                                (A2C):                                2D Strain GLS   -14.9 %                                Avg:                                 3D Volume EF                                LV 3D EF:    Left                                             ventricul                                             ar                                             ejection                                             fraction                                             by 3D                                             volume is  59 %.                                 3D Volume EF:                                 3D EF:        59 %                                LV EDV:       186 ml                                LV ESV:       77 ml                                LV SV:        109 ml RIGHT VENTRICLE             IVC RV Basal diam:  4.90 cm     IVC diam: 1.40 cm RV S prime:     10.40 cm/s TAPSE (M-mode): 2.5 cm LEFT ATRIUM              Index        RIGHT ATRIUM           Index LA diam:        4.80 cm  2.35 cm/m   RA Area:     20.20 cm LA Vol (A2C):   103.0 ml 50.32 ml/m  RA Volume:   63.00 ml  30.78 ml/m LA Vol (A4C):   81.9 ml  40.01 ml/m LA Biplane Vol: 93.7 ml  45.78 ml/m  AORTIC VALVE AV Area (Vmax):    2.81 cm AV Area (Vmean):   2.89 cm AV Area (VTI):     2.98 cm AV Vmax:           175.00 cm/s AV Vmean:          121.000 cm/s AV VTI:            0.365 m AV Peak Grad:      12.2 mmHg AV Mean Grad:      7.0 mmHg LVOT Vmax:         142.00 cm/s LVOT Vmean:        101.000 cm/s LVOT VTI:          0.314 m LVOT/AV VTI ratio: 0.86 AI PHT:            325 msec  AORTA Ao Root diam: 3.80 cm MITRAL VALVE MV Area (PHT): 4.80 cm     SHUNTS MV Decel Time: 158 msec     Systemic VTI:  0.31 m MV E velocity: 74.90 cm/s   Systemic Diam: 2.10 cm MV A velocity: 118.00 cm/s MV E/A ratio:  0.63 Armanda Magic MD Electronically signed by Armanda Magic MD Signature Date/Time: 01/05/2024/4:52:09 PM    Final    DG Chest Portable 1 View Result Date: 01/05/2024 CLINICAL DATA:  Altered mental status, fall EXAM: PORTABLE CHEST 1 VIEW COMPARISON:  12/07/2022, 12/01/2022, 01/15/2021 FINDINGS: Post sternotomy changes. Cardiomegaly with mild central congestion. Patchy atelectasis or infiltrate left  base. Hiatal hernia likely contributes to retrocardiac opacity. Vague right mid lung opacity less conspicuous. Aortic atherosclerosis. No pneumothorax IMPRESSION: 1. Cardiomegaly with mild central congestion. 2. Patchy atelectasis or infiltrate at the left base. Hiatal hernia likely contributes to retrocardiac opacity. 3. Vague right mid lung opacity  slightly less conspicuous but persistent and chest CT previously recommended for this finding. Electronically Signed   By: Jasmine Pang M.D.   On: 01/05/2024 16:14   CT HEAD CODE STROKE WO CONTRAST Addendum Date: 01/05/2024 ADDENDUM REPORT: 01/05/2024 13:31 ADDENDUM: Impression #1 discussed by telephone on 01/05/2024 at 1:30 pm with provider Dr. Amada Jupiter, who verbally acknowledged these results. Electronically Signed   By: Jackey Loge D.O.   On: 01/05/2024 13:31   Result Date: 01/05/2024 CLINICAL DATA:  Code stroke. Provided history: Neuro deficit, acute, stroke suspected. EXAM: CT HEAD WITHOUT CONTRAST TECHNIQUE: Contiguous axial images were obtained from the base of the skull through the vertex without intravenous contrast. RADIATION DOSE REDUCTION: This exam was performed according to the departmental dose-optimization program which includes automated exposure control, adjustment of the mA and/or kV according to patient size and/or use of iterative reconstruction technique. COMPARISON:  Head CT 12/01/2022. FINDINGS: Brain: Generalized cerebral atrophy. 3.8 x 1.5 x 3.1 cm (9 mL) acute parenchymal hemorrhage centered within the left basal ganglia with surrounding edema. Focus of chronic encephalomalacia/gliosis again demonstrated within the right superior frontal gyrus from prior intraparenchymal hemorrhage. Background moderate to advanced patchy and ill-defined hypoattenuation within the cerebral white matter, nonspecific but compatible chronic small vessel disease. Vascular: No hyperdense vessel.  Atherosclerotic calcifications. Skull: No calvarial fracture or aggressive osseous lesion. Sinuses/Orbits: No mass or acute finding within the imaged orbits. Mild mucosal thickening within the bilateral ethmoid, bilateral sphenoid and bilateral maxillary sinuses. Small right frontoethmoidal sinus osteoma. Attempts are being made to reach the ordering provider at this time. IMPRESSION: 1. 3.8 x 1.5 x 3.1 cm (9  mL) acute parenchymal hemorrhage centered within the left basal ganglia with surrounding edema. 2. Chronic encephalomalacia/gliosis again demonstrated within the right superior frontal gyrus from prior intraparenchymal hemorrhage. 3. Background parenchymal atrophy and chronic small vessel ischemic disease. 4. Paranasal sinus disease as described. Electronically Signed: By: Jackey Loge D.O. On: 01/05/2024 13:03   CT ANGIO HEAD NECK W WO CM (CODE STROKE) Result Date: 01/05/2024 CLINICAL DATA:  89 year old male code stroke presentation today with left lentiform hemorrhage. EXAM: CT ANGIOGRAPHY HEAD AND NECK TECHNIQUE: Multidetector CT imaging of the head and neck was performed using the standard protocol during bolus administration of intravenous contrast. Multiplanar CT image reconstructions and MIPs were obtained to evaluate the vascular anatomy. Carotid stenosis measurements (when applicable) are obtained utilizing NASCET criteria, using the distal internal carotid diameter as the denominator. RADIATION DOSE REDUCTION: This exam was performed according to the departmental dose-optimization program which includes automated exposure control, adjustment of the mA and/or kV according to patient size and/or use of iterative reconstruction technique. CONTRAST:  75mL OMNIPAQUE IOHEXOL 350 MG/ML SOLN COMPARISON:  Head CT 1251 hours. FINDINGS: CTA NECK Skeleton: Previous cervical ACDF C5 through C7 with solid arthrodesis. Previous sternotomy. No acute osseous abnormality identified. Upper chest: Patchy and asymmetric but mostly dependent opacity in the visible right lung. Favor a combination of atelectasis and scarring. Visible major airways are patent. No superior mediastinal lymphadenopathy. Other neck: Trace retained secretions in the hypopharynx. Other neck soft tissue spaces are within normal limits. Aortic arch: Calcified aortic atherosclerosis. Tortuous aortic arch. Three vessel arch. Right carotid system:  Brachiocephalic artery soft and calcified plaque, tortuosity without stenosis. Tortuous right CCA with medial soft plaque at the larynx, no stenosis. Calcified plaque at the right ICA origin and tortuosity distal to the bulb without stenosis. Left carotid system: Left CCA origin calcified plaque. Calcified plaque along the medial left ICA origin without stenosis. Vertebral arteries: Calcified right subclavian artery origin with less than 50 % stenosis with respect to the distal vessel. Right vertebral artery origin is normal. Tortuous right V1 segment. Mildly dominant right vertebral artery is patent to the skull base with no significant plaque or stenosis. Left subclavian artery origin, proximal soft and calcified plaque without stenosis. Left vertebral artery origin remains normal. Non dominant but fairly normal caliber left vertebral artery with tortuous V1 segment. No left vertebral stenosis to the skull base. CTA HEAD Posterior circulation: Mild bilateral distal vertebral artery plaque without stenosis. Patent vertebrobasilar junction, PICA origins. Dominant right V4. Patent basilar artery without stenosis. Normal SCA and PCA origins. Both posterior communicating arteries are present. Bilateral PCA branches are within normal limits. Anterior circulation: Both ICA siphons are patent. Mild cavernous but moderate supraclinoid calcified ICA siphon plaque bilaterally. Mild bilateral siphon tortuosity. Only mild supraclinoid siphon stenosis. Normal posterior communicating arteries. Patent carotid termini. Normal MCA and ACA origins. Dominant right A1. Normal anterior communicating artery. Bilateral ACA branches are within normal limits. Enhancing left lenticulostriate branches of the left MCA with no CTA spot sign identified. No abnormal vascularity identified in the region. Left MCA M1, trifurcation, left MCA branches are within normal limits. Right MCA M1 segment, bifurcation, right MCA branches are within normal  limits. Venous sinuses: Patent. Anatomic variants: Dominant right vertebral artery. Dominant right ACA A1. Review of the MIP images confirms the above findings IMPRESSION: 1. Negative for large vessel occlusion, CTA spot sign, intracranial aneurysm, or AVM in association with the acute Left lentiform Hemorrhage. 2. Generalized arterial tortuosity in the head and neck with comparatively mild for age atherosclerosis. No hemodynamically significant arterial stenosis is identified. Aortic Atherosclerosis (ICD10-I70.0). 3. Patchy and asymmetric but mostly dependent opacity in the visible right lung, favor combination of atelectasis and scarring. Electronically Signed   By: Odessa Fleming M.D.   On: 01/05/2024 13:20    Pending Labs Unresulted Labs (From admission, onward)     Start     Ordered   01/06/24 0500  Basic metabolic panel  Tomorrow morning,   R        01/05/24 1400   01/05/24 1402  Hemoglobin A1c  Once,   R       Comments: To assess prior glycemic control    01/05/24 1401            Vitals/Pain Today's Vitals   01/05/24 1630 01/05/24 1631 01/05/24 1631 01/05/24 1640  BP: (!) 140/63   135/62  Pulse: 78   75  Resp:      Temp:      TempSrc:      SpO2: 91%   90%  Weight:  82.6 kg    Height:  6' (1.829 m)    PainSc:   0-No pain     Isolation Precautions No active isolations  Medications Medications  clevidipine (CLEVIPREX) infusion 0.5 mg/mL (4 mg/hr Intravenous Infusion Verify 01/05/24 1400)   stroke: early stages of recovery book (has no administration in time range)  acetaminophen (TYLENOL) tablet 650 mg (has no administration in time range)    Or  acetaminophen (TYLENOL) 160 MG/5ML solution 650 mg (has no administration  in time range)    Or  acetaminophen (TYLENOL) suppository 650 mg (has no administration in time range)  senna-docusate (Senokot-S) tablet 1 tablet (0 tablets Oral Hold 01/05/24 1413)  pantoprazole (PROTONIX) injection 40 mg (has no administration in time  range)  potassium chloride 10 mEq in 100 mL IVPB (10 mEq Intravenous New Bag/Given 01/05/24 1743)  insulin aspart (novoLOG) injection 0-9 Units (1 Units Subcutaneous Given 01/05/24 1743)  perflutren lipid microspheres (DEFINITY) IV suspension (3 mLs Intravenous Given 01/05/24 1550)  sodium chloride flush (NS) 0.9 % injection 3 mL (3 mLs Intravenous Given 01/05/24 1406)  iohexol (OMNIPAQUE) 350 MG/ML injection 75 mL (75 mLs Intravenous Contrast Given 01/05/24 1259)  labetalol (NORMODYNE) injection 10 mg (10 mg Intravenous Given 01/05/24 1251)  magnesium sulfate IVPB 2 g 50 mL (0 g Intravenous Stopped 01/05/24 1521)    Mobility walks     Focused Assessments Cardiac Assessment Handoff:  Cardiac Rhythm: Normal sinus rhythm Lab Results  Component Value Date   CKTOTAL 117 01/05/2024   CKMB 1.0 07/22/2008   TROPONINI 0.01 08/23/2018   Lab Results  Component Value Date   DDIMER <0.27 12/01/2021   Does the Patient currently have chest pain? No   , Neuro Assessment Handoff:  Swallow screen pass? Yes  Cardiac Rhythm: Normal sinus rhythm NIH Stroke Scale  Dizziness Present: No Headache Present: No Interval: Shift assessment Level of Consciousness (1a.)   : Alert, keenly responsive LOC Questions (1b. )   : Answers both questions correctly LOC Commands (1c. )   : Performs both tasks correctly Best Gaze (2. )  : Normal Visual (3. )  : No visual loss Facial Palsy (4. )    : Partial paralysis  Motor Arm, Left (5a. )   : No drift Motor Arm, Right (5b. ) : Drift Motor Leg, Left (6a. )  : No drift Motor Leg, Right (6b. ) : Drift Limb Ataxia (7. ): Absent Sensory (8. )  : Normal, no sensory loss Best Language (9. )  : Severe aphasia Dysarthria (10. ): Mild-to-moderate dysarthria, patient slurs at least some words and, at worst, can be understood with some difficulty Extinction/Inattention (11.)   : No Abnormality Complete NIHSS TOTAL: 7 Last date known well: 01/05/24 Last time known well:  1100 Neuro Assessment:   Neuro Checks:   Initial (01/05/24 1300)  Has TPA been given? No If patient is a Neuro Trauma and patient is going to OR before floor call report to 4N Charge nurse: [phone] or [phone]   R Recommendations: See Admitting Provider Note  Report given to:   Additional Notes:

## 2024-01-05 NOTE — Progress Notes (Signed)
  Echocardiogram 2D Echocardiogram has been performed.  John [name] 01/05/2024, 3:49 PM

## 2024-01-05 NOTE — Code Documentation (Signed)
 Stroke Response Nurse Documentation Code Documentation  John [name] is a 89 y.o. male arriving to [facility]  via [facility] on 01/05/2024 with past medical hx significant of hyperlipidemia, CAD status post artery bypass graft, CKD stage II, depression, transient ischemic attack, SVT, sleep apnea, diabetes, and anxiety. On No antithrombotic. Code stroke was activated by EMS.   Patient from [place] where he was LKW at 1100 when he got up to go to the bathroom and noted right sided weakness. He fell but does not recall hitting his head.  Stroke team at the bedside on patient arrival. Labs drawn and patient cleared for CT by EDP. Patient to CT with team. NIHSS 9, see documentation for details and code stroke times. Patient with right facial droop, right arm weakness, right leg weakness, Expressive aphasia , and dysarthria  on exam. The following imaging was completed:  CT Head and CTA. Patient is not a candidate for IV Thrombolytic due to ICH. Patient is not a candidate for IR due to no LVO. Given 10mg  labetalol IV for elevated SBP with no response. Cleviprex started. See VS and MAR documentation.   Care Plan: admit to ICU; Q 1 NIHSS; Keep SBP 130-150.   Bedside handoff with ED RN.    Ferman Hamming Stroke Response RN

## 2024-01-06 ENCOUNTER — Inpatient Hospital Stay (HOSPITAL_COMMUNITY)

## 2024-01-06 DIAGNOSIS — I610 Nontraumatic intracerebral hemorrhage in hemisphere, subcortical: Secondary | ICD-10-CM | POA: Diagnosis not present

## 2024-01-06 LAB — BASIC METABOLIC PANEL
Anion gap: 9 (ref 5–15)
Anion gap: 8 (ref 5–15)
BUN: 11 mg/dL (ref 8–23)
BUN: 12 mg/dL (ref 8–23)
CO2: 28 mmol/L (ref 22–32)
CO2: 29 mmol/L (ref 22–32)
Calcium: 8.2 mg/dL — ABNORMAL LOW (ref 8.9–10.3)
Calcium: 8.5 mg/dL — ABNORMAL LOW (ref 8.9–10.3)
Chloride: 103 mmol/L (ref 98–111)
Chloride: 105 mmol/L (ref 98–111)
Creatinine, Ser: 1.03 mg/dL (ref 0.61–1.24)
Creatinine, Ser: 1.25 mg/dL — ABNORMAL HIGH (ref 0.61–1.24)
GFR, Estimated: >60 mL/min (ref >60)
GFR, Estimated: 56 mL/min — ABNORMAL LOW (ref >60)
Glucose, Bld: 117 mg/dL — ABNORMAL HIGH (ref 70–99)
Glucose, Bld: 128 mg/dL — ABNORMAL HIGH (ref 70–99)
Potassium: 2.5 mmol/L — CL (ref 3.5–5.1)
Potassium: 3.4 mmol/L — ABNORMAL LOW (ref 3.5–5.1)
Sodium: 140 mmol/L (ref 135–145)
Sodium: 142 mmol/L (ref 135–145)

## 2024-01-06 LAB — GLUCOSE, CAPILLARY
Glucose-Capillary: 113 mg/dL — ABNORMAL HIGH (ref 70–99)
Glucose-Capillary: 124 mg/dL — ABNORMAL HIGH (ref 70–99)
Glucose-Capillary: 170 mg/dL — ABNORMAL HIGH (ref 70–99)
Glucose-Capillary: 107 mg/dL — ABNORMAL HIGH (ref 70–99)
Glucose-Capillary: 128 mg/dL — ABNORMAL HIGH (ref 70–99)
Glucose-Capillary: 135 mg/dL — ABNORMAL HIGH (ref 70–99)

## 2024-01-06 LAB — TRIGLYCERIDES: Triglycerides: 164 mg/dL — ABNORMAL HIGH (ref <150)

## 2024-01-06 MED ORDER — POTASSIUM CHLORIDE 20 MEQ PO PACK
40.0000 meq | PACK | ORAL | Status: DC
  Administered 2024-01-06: 40 meq
  Filled 2024-01-06: qty 2

## 2024-01-06 MED ORDER — POTASSIUM CHLORIDE 20 MEQ PO PACK
40.0000 meq | PACK | ORAL | Status: AC
  Administered 2024-01-07: 40 meq via ORAL
  Filled 2024-01-06: qty 2

## 2024-01-06 MED ORDER — AMLODIPINE BESYLATE 5 MG PO TABS
5.0000 mg | ORAL_TABLET | Freq: Every day | ORAL | Status: DC
  Administered 2024-01-06: 5 mg via ORAL
  Filled 2024-01-06: qty 1

## 2024-01-06 MED ORDER — POTASSIUM CHLORIDE CRYS ER 20 MEQ PO TBCR
40.0000 meq | EXTENDED_RELEASE_TABLET | Freq: Once | ORAL | Status: AC
  Administered 2024-01-06: 40 meq via ORAL
  Filled 2024-01-06: qty 2

## 2024-01-06 MED ORDER — POTASSIUM CHLORIDE 10 MEQ/100ML IV SOLN
10.0000 meq | INTRAVENOUS | Status: AC
  Administered 2024-01-06 (×4): 10 meq via INTRAVENOUS
  Filled 2024-01-06 (×4): qty 100

## 2024-01-06 MED ORDER — LABETALOL HCL 5 MG/ML IV SOLN
10.0000 mg | INTRAVENOUS | Status: DC | PRN
Start: 2024-01-06 — End: 2024-01-10
  Administered 2024-01-06 – 2024-01-10 (×12): 10 mg via INTRAVENOUS
  Filled 2024-01-06 (×13): qty 4

## 2024-01-06 MED ORDER — CHLORHEXIDINE GLUCONATE CLOTH 2 % EX PADS
6.0000 | MEDICATED_PAD | Freq: Every day | CUTANEOUS | Status: DC
Start: 2024-01-06 — End: 2024-01-08
  Administered 2024-01-06 – 2024-01-07 (×3): 6 via TOPICAL

## 2024-01-06 MED ORDER — PANTOPRAZOLE SODIUM 40 MG PO TBEC
40.0000 mg | DELAYED_RELEASE_TABLET | Freq: Every day | ORAL | Status: DC
  Administered 2024-01-06 – 2024-01-07 (×2): 40 mg via ORAL
  Filled 2024-01-06 (×2): qty 1

## 2024-01-06 MED ORDER — GADOBUTROL 1 MMOL/ML IV SOLN
8.0000 mL | Freq: Once | INTRAVENOUS | Status: AC | PRN
  Administered 2024-01-06: 8 mL via INTRAVENOUS

## 2024-01-06 MED ORDER — HYDRALAZINE HCL 20 MG/ML IJ SOLN
10.0000 mg | INTRAMUSCULAR | Status: DC | PRN
  Administered 2024-01-06 – 2024-01-07 (×7): 10 mg via INTRAVENOUS
  Filled 2024-01-06 (×7): qty 1

## 2024-01-06 MED ORDER — LABETALOL HCL 5 MG/ML IV SOLN: 10.0000 mg | INTRAVENOUS | Status: DC | PRN

## 2024-01-06 MED ORDER — ENOXAPARIN SODIUM 40 MG/0.4ML IJ SOSY
40.0000 mg | PREFILLED_SYRINGE | INTRAMUSCULAR | Status: DC
  Administered 2024-01-06 – 2024-01-13 (×8): 40 mg via SUBCUTANEOUS
  Filled 2024-01-06 (×9): qty 0.4

## 2024-01-06 NOTE — Progress Notes (Addendum)
 STROKE TEAM PROGRESS NOTE    SIGNIFICANT HOSPITAL EVENTS 3/13-patient admitted with left basal ganglia ICH  INTERIM HISTORY/SUBJECTIVE Patient has required Cleviprex to maintain BP within parameters.  He continues to be dysarthric, and speech is hard to understand at times.  Potassium noted to be 2.5 this morning and was repleted, will recheck this afternoon. Repeat CT head this morning shows stable appearance of the 3.8 x 1.3 cm left basal ganglia hemorrhage with mild cytotoxic edema with no intraventricular extension or hydrocephalus.  OBJECTIVE  CBC    Component Value Date/Time   WBC 4.6 01/05/2024 1247   RBC 3.77 (L) 01/05/2024 1247   HGB 12.6 (L) 01/05/2024 1251   HGB 13.4 12/15/2016 1705   HCT 37.0 (L) 01/05/2024 1251   HCT 39.2 12/15/2016 1705   PLT 227 01/05/2024 1247   PLT 206 12/15/2016 1705   MCV 97.9 01/05/2024 1247   MCV 92 12/15/2016 1705   MCH 34.0 01/05/2024 1247   MCHC 34.7 01/05/2024 1247   RDW 13.4 01/05/2024 1247   RDW 13.4 12/15/2016 1705   LYMPHSABS 1.5 01/05/2024 1247   LYMPHSABS 1.8 12/15/2016 1705   MONOABS 0.4 01/05/2024 1247   EOSABS 0.2 01/05/2024 1247   EOSABS 0.1 12/15/2016 1705   BASOSABS 0.0 01/05/2024 1247   BASOSABS 0.0 12/15/2016 1705    BMET    Component Value Date/Time   NA 140 01/06/2024 0339   NA 144 12/15/2016 1705   K 2.5 (LL) 01/06/2024 0339   CL 103 01/06/2024 0339   CO2 28 01/06/2024 0339   GLUCOSE 117 (H) 01/06/2024 0339   BUN 11 01/06/2024 0339   BUN 21 12/15/2016 1705   CREATININE 1.03 01/06/2024 0339   CREATININE 1.21 12/30/2022 1623   CALCIUM 8.2 (L) 01/06/2024 0339   EGFR 58 (L) 12/30/2022 1623   GFRNONAA >60 01/06/2024 0339   GFRNONAA 52 (L) 07/24/2020 1233    IMAGING past 24 hours CT HEAD WO CONTRAST ( ) Result Date: 01/06/2024 CLINICAL DATA:  Hemorrhagic stroke EXAM: CT HEAD WITHOUT CONTRAST TECHNIQUE: Contiguous axial images were obtained from the base of the skull through the vertex without intravenous  contrast. RADIATION DOSE REDUCTION: This exam was performed according to the departmental dose-optimization program which includes automated exposure control, adjustment of the mA and/or kV according to patient size and/or use of iterative reconstruction technique. COMPARISON:  Head CT from yesterday FINDINGS: Brain: Unchanged hematoma centered at the left basal ganglia measuring up to 3.8 x 1.3 cm on axial images. Mild local mass effect is unchanged. Chronic small vessel disease with confluent ischemic white matter low-density. Chronic encephalomalacia in the superior right frontal lobe. No evidence of acute infarct. No hydrocephalus or new site of hemorrhage Vascular: No hyperdense vessel or unexpected calcification. Skull: Normal. Negative for fracture or focal lesion. Sinuses/Orbits: No acute finding. IMPRESSION: No progression of the acute ICH at the left basal ganglia. Electronically Signed   By: Tiburcio Pea M.D.   On: 01/06/2024 10:06   ECHOCARDIOGRAM COMPLETE Result Date: 01/05/2024    ECHOCARDIOGRAM REPORT   Patient Name:   John [name] Date of Exam: 01/05/2024 Medical Rec #:  [id number]         Height:       72.0 in Accession #:    [id number]        Weight:       182.0 lb Date of Birth:  10/03/1935         BSA:  2.047 m Patient Age:    89 years          BP:           141/55 mmHg Patient Gender: M                 HR:           76 bpm. Exam Location:  Inpatient Procedure: 2D Echo, 3D Echo, Cardiac Doppler, Color Doppler, Strain Analysis and            Intracardiac Opacification Agent (Both Spectral and Color Flow            Doppler were utilized during procedure). Indications:    Stroke  History:        Patient has prior history of Echocardiogram examinations, most                 recent 11/12/2018.  Sonographer:    Karma Ganja Referring Phys: [phone] MCNEILL P KIRKPATRICK  Sonographer Comments: Global longitudinal strain was attempted. IMPRESSIONS  1. Left ventricular ejection fraction, by  estimation, is 60 to 65%. Left ventricular ejection fraction by 3D volume is 59 %. The left ventricle has normal function. The left ventricle has no regional wall motion abnormalities. Left ventricular diastolic  parameters are consistent with Grade I diastolic dysfunction (impaired relaxation).  2. Right ventricular systolic function is normal. The right ventricular size is moderately enlarged.  3. Left atrial size was moderately dilated.  4. The mitral valve is normal in structure. No evidence of mitral valve regurgitation. No evidence of mitral stenosis.  5. The aortic valve is tricuspid. Aortic valve regurgitation is moderate. Aortic valve sclerosis/calcification is present, without any evidence of aortic stenosis. Aortic regurgitation PHT measures 325 msec. Aortic valve area, by VTI measures 2.98 cm. Aortic valve mean gradient measures 7.0 mmHg. Aortic valve Vmax measures 1.75 m/s.  6. Aortic dilatation noted. There is mild dilatation of the aortic root, measuring 38 mm.  7. The inferior vena cava is normal in size with greater than 50% respiratory variability, suggesting right atrial pressure of 3 mmHg. Conclusion(s)/Recommendation(s): No intracardiac source of embolism detected on this transthoracic study. Consider a transesophageal echocardiogram to exclude cardiac source of embolism if clinically indicated. FINDINGS  Left Ventricle: Left ventricular ejection fraction, by estimation, is 60 to 65%. Left ventricular ejection fraction by 3D volume is 59 %. The left ventricle has normal function. The left ventricle has no regional wall motion abnormalities. Definity contrast agent was given IV to delineate the left ventricular endocardial borders. Global longitudinal strain performed but not reported based on interpreter judgement due to suboptimal tracking. The left ventricular internal cavity size was normal in size. There is no left ventricular hypertrophy. Left ventricular diastolic parameters are  consistent with Grade I diastolic dysfunction (impaired relaxation). Normal left ventricular filling pressure. Right Ventricle: The right ventricular size is moderately enlarged. No increase in right ventricular wall thickness. Right ventricular systolic function is normal. Left Atrium: Left atrial size was moderately dilated. Right Atrium: Right atrial size was normal in size. Pericardium: There is no evidence of pericardial effusion. Mitral Valve: The mitral valve is normal in structure. No evidence of mitral valve regurgitation. No evidence of mitral valve stenosis. Tricuspid Valve: The tricuspid valve is normal in structure. Tricuspid valve regurgitation is not demonstrated. No evidence of tricuspid stenosis. Aortic Valve: The aortic valve is tricuspid. Aortic valve regurgitation is moderate. Aortic regurgitation PHT measures 325 msec. Aortic valve sclerosis/calcification is present, without any evidence  of aortic stenosis. Aortic valve mean gradient measures  7.0 mmHg. Aortic valve peak gradient measures 12.2 mmHg. Aortic valve area, by VTI measures 2.98 cm. Pulmonic Valve: The pulmonic valve was normal in structure. Pulmonic valve regurgitation is not visualized. No evidence of pulmonic stenosis. Aorta: Aortic dilatation noted. There is mild dilatation of the aortic root, measuring 38 mm. Venous: The inferior vena cava is normal in size with greater than 50% respiratory variability, suggesting right atrial pressure of 3 mmHg. IAS/Shunts: No atrial level shunt detected by color flow Doppler. Additional Comments: 3D was performed not requiring image post processing on an independent workstation and was normal.  LEFT VENTRICLE PLAX 2D LVIDd:         4.95 cm         Diastology LVIDs:         3.25 cm         LV e' medial:    7.18 cm/s LV PW:         1.00 cm         LV E/e' medial:  10.4 LV IVS:        1.55 cm         LV e' lateral:   6.53 cm/s LVOT diam:     2.10 cm         LV E/e' lateral: 11.5 LV SV:         109  LV SV Index:   53              2D Longitudinal LVOT Area:     3.46 cm        Strain                                2D Strain GLS   -14.0 %                                (A4C):                                2D Strain GLS   -15.6 %                                (A3C):                                2D Strain GLS   -15.1 %                                (A2C):                                2D Strain GLS   -14.9 %                                Avg:                                 3D Volume EF  LV 3D EF:    Left                                             ventricul                                             ar                                             ejection                                             fraction                                             by 3D                                             volume is                                             59 %.                                 3D Volume EF:                                3D EF:        59 %                                LV EDV:       186 ml                                LV ESV:       77 ml                                LV SV:        109 ml RIGHT VENTRICLE             IVC RV Basal diam:  4.90 cm     IVC diam: 1.40 cm RV S prime:     10.40 cm/s TAPSE (M-mode): 2.5 cm LEFT ATRIUM              Index        RIGHT ATRIUM  Index LA diam:        4.80 cm  2.35 cm/m   RA Area:     20.20 cm LA Vol (A2C):   103.0 ml 50.32 ml/m  RA Volume:   63.00 ml  30.78 ml/m LA Vol (A4C):   81.9 ml  40.01 ml/m LA Biplane Vol: 93.7 ml  45.78 ml/m  AORTIC VALVE AV Area (Vmax):    2.81 cm AV Area (Vmean):   2.89 cm AV Area (VTI):     2.98 cm AV Vmax:           175.00 cm/s AV Vmean:          121.000 cm/s AV VTI:            0.365 m AV Peak Grad:      12.2 mmHg AV Mean Grad:      7.0 mmHg LVOT Vmax:         142.00 cm/s LVOT Vmean:        101.000 cm/s LVOT VTI:          0.314 m LVOT/AV VTI ratio: 0.86 AI PHT:            325  msec  AORTA Ao Root diam: 3.80 cm MITRAL VALVE MV Area (PHT): 4.80 cm     SHUNTS MV Decel Time: 158 msec     Systemic VTI:  0.31 m MV E velocity: 74.90 cm/s   Systemic Diam: 2.10 cm MV A velocity: 118.00 cm/s MV E/A ratio:  0.63 Armanda Magic MD Electronically signed by Armanda Magic MD Signature Date/Time: 01/05/2024/4:52:09 PM    Final    DG Chest Portable 1 View Result Date: 01/05/2024 CLINICAL DATA:  Altered mental status, fall EXAM: PORTABLE CHEST 1 VIEW COMPARISON:  12/07/2022, 12/01/2022, 01/15/2021 FINDINGS: Post sternotomy changes. Cardiomegaly with mild central congestion. Patchy atelectasis or infiltrate left base. Hiatal hernia likely contributes to retrocardiac opacity. Vague right mid lung opacity less conspicuous. Aortic atherosclerosis. No pneumothorax IMPRESSION: 1. Cardiomegaly with mild central congestion. 2. Patchy atelectasis or infiltrate at the left base. Hiatal hernia likely contributes to retrocardiac opacity. 3. Vague right mid lung opacity slightly less conspicuous but persistent and chest CT previously recommended for this finding. Electronically Signed   By: Jasmine Pang M.D.   On: 01/05/2024 16:14   CT HEAD CODE STROKE WO CONTRAST Addendum Date: 01/05/2024 ADDENDUM REPORT: 01/05/2024 13:31 ADDENDUM: Impression #1 discussed by telephone on 01/05/2024 at 1:30 pm with provider Dr. Amada Jupiter, who verbally acknowledged these results. Electronically Signed   By: Jackey Loge D.O.   On: 01/05/2024 13:31   Result Date: 01/05/2024 CLINICAL DATA:  Code stroke. Provided history: Neuro deficit, acute, stroke suspected. EXAM: CT HEAD WITHOUT CONTRAST TECHNIQUE: Contiguous axial images were obtained from the base of the skull through the vertex without intravenous contrast. RADIATION DOSE REDUCTION: This exam was performed according to the departmental dose-optimization program which includes automated exposure control, adjustment of the mA and/or kV according to patient size and/or use of  iterative reconstruction technique. COMPARISON:  Head CT 12/01/2022. FINDINGS: Brain: Generalized cerebral atrophy. 3.8 x 1.5 x 3.1 cm (9 mL) acute parenchymal hemorrhage centered within the left basal ganglia with surrounding edema. Focus of chronic encephalomalacia/gliosis again demonstrated within the right superior frontal gyrus from prior intraparenchymal hemorrhage. Background moderate to advanced patchy and ill-defined hypoattenuation within the cerebral white matter, nonspecific but compatible chronic small vessel disease. Vascular: No hyperdense vessel.  Atherosclerotic calcifications. Skull: No calvarial fracture or aggressive osseous lesion.  Sinuses/Orbits: No mass or acute finding within the imaged orbits. Mild mucosal thickening within the bilateral ethmoid, bilateral sphenoid and bilateral maxillary sinuses. Small right frontoethmoidal sinus osteoma. Attempts are being made to reach the ordering provider at this time. IMPRESSION: 1. 3.8 x 1.5 x 3.1 cm (9 mL) acute parenchymal hemorrhage centered within the left basal ganglia with surrounding edema. 2. Chronic encephalomalacia/gliosis again demonstrated within the right superior frontal gyrus from prior intraparenchymal hemorrhage. 3. Background parenchymal atrophy and chronic small vessel ischemic disease. 4. Paranasal sinus disease as described. Electronically Signed: By: Jackey Loge D.O. On: 01/05/2024 13:03   CT ANGIO HEAD NECK W WO CM (CODE STROKE) Result Date: 01/05/2024 CLINICAL DATA:  89 year old male code stroke presentation today with left lentiform hemorrhage. EXAM: CT ANGIOGRAPHY HEAD AND NECK TECHNIQUE: Multidetector CT imaging of the head and neck was performed using the standard protocol during bolus administration of intravenous contrast. Multiplanar CT image reconstructions and MIPs were obtained to evaluate the vascular anatomy. Carotid stenosis measurements (when applicable) are obtained utilizing NASCET criteria, using the distal  internal carotid diameter as the denominator. RADIATION DOSE REDUCTION: This exam was performed according to the departmental dose-optimization program which includes automated exposure control, adjustment of the mA and/or kV according to patient size and/or use of iterative reconstruction technique. CONTRAST:  75mL OMNIPAQUE IOHEXOL 350 MG/ML SOLN COMPARISON:  Head CT 1251 hours. FINDINGS: CTA NECK Skeleton: Previous cervical ACDF C5 through C7 with solid arthrodesis. Previous sternotomy. No acute osseous abnormality identified. Upper chest: Patchy and asymmetric but mostly dependent opacity in the visible right lung. Favor a combination of atelectasis and scarring. Visible major airways are patent. No superior mediastinal lymphadenopathy. Other neck: Trace retained secretions in the hypopharynx. Other neck soft tissue spaces are within normal limits. Aortic arch: Calcified aortic atherosclerosis. Tortuous aortic arch. Three vessel arch. Right carotid system: Brachiocephalic artery soft and calcified plaque, tortuosity without stenosis. Tortuous right CCA with medial soft plaque at the larynx, no stenosis. Calcified plaque at the right ICA origin and tortuosity distal to the bulb without stenosis. Left carotid system: Left CCA origin calcified plaque. Calcified plaque along the medial left ICA origin without stenosis. Vertebral arteries: Calcified right subclavian artery origin with less than 50 % stenosis with respect to the distal vessel. Right vertebral artery origin is normal. Tortuous right V1 segment. Mildly dominant right vertebral artery is patent to the skull base with no significant plaque or stenosis. Left subclavian artery origin, proximal soft and calcified plaque without stenosis. Left vertebral artery origin remains normal. Non dominant but fairly normal caliber left vertebral artery with tortuous V1 segment. No left vertebral stenosis to the skull base. CTA HEAD Posterior circulation: Mild bilateral  distal vertebral artery plaque without stenosis. Patent vertebrobasilar junction, PICA origins. Dominant right V4. Patent basilar artery without stenosis. Normal SCA and PCA origins. Both posterior communicating arteries are present. Bilateral PCA branches are within normal limits. Anterior circulation: Both ICA siphons are patent. Mild cavernous but moderate supraclinoid calcified ICA siphon plaque bilaterally. Mild bilateral siphon tortuosity. Only mild supraclinoid siphon stenosis. Normal posterior communicating arteries. Patent carotid termini. Normal MCA and ACA origins. Dominant right A1. Normal anterior communicating artery. Bilateral ACA branches are within normal limits. Enhancing left lenticulostriate branches of the left MCA with no CTA spot sign identified. No abnormal vascularity identified in the region. Left MCA M1, trifurcation, left MCA branches are within normal limits. Right MCA M1 segment, bifurcation, right MCA branches are within normal limits. Venous sinuses: Patent.  Anatomic variants: Dominant right vertebral artery. Dominant right ACA A1. Review of the MIP images confirms the above findings IMPRESSION: 1. Negative for large vessel occlusion, CTA spot sign, intracranial aneurysm, or AVM in association with the acute Left lentiform Hemorrhage. 2. Generalized arterial tortuosity in the head and neck with comparatively mild for age atherosclerosis. No hemodynamically significant arterial stenosis is identified. Aortic Atherosclerosis (ICD10-I70.0). 3. Patchy and asymmetric but mostly dependent opacity in the visible right lung, favor combination of atelectasis and scarring. Electronically Signed   By: Odessa Fleming M.D.   On: 01/05/2024 13:20    Vitals:   01/06/24 0715 01/06/24 0730 01/06/24 0745 01/06/24 0755  BP: (!) 137/58 (!) 150/66 134/74   Pulse: 74 85 82   Resp: (!) 23 (!) 30 17   Temp:    98.4 F (36.9 C)  TempSrc:    Oral  SpO2: 97% 95% 97%   Weight:      Height:          PHYSICAL EXAM General:  Alert, well-nourished, well-developed elderly Caucasian male in no acute distress Psych:  Mood and affect appropriate for situation CV: Rhythm irregular, appears to be sinus arrhythmia Respiratory:  Regular, unlabored respirations on room air GI: Abdomen soft and nontender   NEURO:  Mental Status: AA&Ox3, patient is able to give some history Speech/Language: speech is with moderate dysarthria, difficult to understand at times, repetition intact  Cranial Nerves:  II: PERRL. Visual fields full.  III, IV, VI: EOMI. left gaze preference but able to cross midline VII: Right facial droop VIII: hearing intact to voice. IX, X: Voice is dysarthric XII: tongue is deviated to the right Motor: Left upper extremity stronger than right, equal strength in bilateral lower extremities Tone: is normal and bulk is normal Sensation- Intact to light touch bilaterally.  Coordination: FTN intact bilaterally with slight dysmetria on the right, in proportion to weakness Gait- deferred  Most Recent NIH  1a Level of Conscious.: 0 1b LOC Questions: 1 1c LOC Commands: 0 2 Best Gaze: 1 3 Visual: 0 4 Facial Palsy: 1 5a Motor Arm - left: 0 5b Motor Arm - Right: 0 6a Motor Leg - Left: 0 6b Motor Leg - Right: 0 7 Limb Ataxia: 0 8 Sensory: 0 9 Best Language: 0 10 Dysarthria: 1 11 Extinct. and Inatten.: 0 TOTAL: 4   ASSESSMENT/PLAN  [name] is a 89 y.o. male with history of A-fib not on anticoagulation, CAD, CKD stage II, diabetes and hyperlipidemia admitted for acute onset right-sided weakness resulting in a fall.  Patient was found to have a small ICH in the left basal ganglia on CT.  He reports that his symptoms have improved somewhat overnight. NIH on Admission 8  Intracerebral Hemorrhage:  left basal ganglia ICH Etiology: Uncertain at this time, MRI pending Code Stroke CT head 9 mm acute IPH centered within left basal ganglia with surrounding edema,  chronic encephalomalacia in right superior frontal gyrus at site of old IPH, background atrophy and chronic small vessel ischemic disease CTA head & neck no LVO, AVM, spot sign or aneurysm, generalized arterial tortuosity MRI with and without contrast pending 2D Echo EF 60 to 65%, grade 1 diastolic dysfunction, moderately dilated left atrium, no atrial level shunt LDL No results found for requested labs within last 1095 days. HgbA1c 5.2 VTE prophylaxis -SCDs No antithrombotic prior to admission, now on No antithrombotic secondary to ICH Therapy recommendations:  Pending Disposition: Pending   Atrial fibrillation Home Meds:  None Continue telemetry monitoring May consider anticoagulation in the outpatient setting  Hypertension Home meds: Amlodipine 5 mg daily Unstable, requiring Cleviprex Blood Pressure Goal: SBP between 130-150 for 24 hours and then less than 160   Hyperlipidemia Home meds: None LDL No results found for requested labs within last 1095 days., goal < 70 Add statin at discharge if LDL greater than 70  Diabetes type II Controlled Home meds: None HgbA1c 5.2, goal < 7.0 CBGs SSI Recommend close follow-up with PCP for better DM control  Other Stroke Risk Factors Coronary artery disease   Other Active Problems None  Hospital day # 1  Patient seen by NP with MD, MD to edit note as needed. Cortney E Ernestina Columbia , MSN, AGACNP-BC [facility] See Amion for schedule and pager information 01/06/2024 10:54 AM   I have personally obtained history,examined this patient, reviewed notes, independently viewed imaging studies, participated in medical decision making and plan of care.ROS completed by me personally and pertinent positives fully documented  I have made any additions or clarifications directly to the above note. Agree with note above.  Patient presented with dysarthria and facial droop due to left basal ganglia hemorrhage with mild toxic edema  etiology indeterminant possibly hypertensive with hemorrhagic infarct is less likely.  Recommend close neurological observation and strict blood pressure control with systolic goal between 130-150 for the first 24 hours and then below 160.  Wean Cleviprex drip and use as needed labetalol/hydralazine and oral medications when able to swallow safely.  Mobilize out of bed.  Therapy consults.  No family at the bedside for discussion.This patient is critically ill and at significant risk of neurological worsening, death and care requires constant monitoring of vital signs, hemodynamics,respiratory and cardiac monitoring, extensive review of multiple databases, frequent neurological assessment, discussion with family, other specialists and medical decision making of high complexity.I have made any additions or clarifications directly to the above note.This critical care time does not reflect procedure time, or teaching time or supervisory time of PA/NP/Med Resident etc but could involve care discussion time.  I spent 30 minutes of neurocritical care time  in the care of  this patient.      Delia Heady, MD Medical Director [facility] Pager: [phone] 01/06/2024 3:16 PM   To contact Stroke Continuity provider, please refer to [email or web address]. After hours, contact General Neurology

## 2024-01-06 NOTE — TOC Initial Note (Addendum)
 Transition of Care [facility]) - Initial/Assessment Note    Patient Details  Name: John [name] MRN: [id number] Date of Birth: [birth year]  Transition of Care [facility]) CM/SW Contact:    Marliss Coots, LCSW Phone Number: 01/06/2024, 8:51 AM  Clinical Narrative:                  8:51 AM Per chart review, patient is from [facility].  10:01 AM Per chart review, patient has legal guardian but documentation is not on chart. CSW attempted to call patient's daughter, [name], for clarification, but there was no response and a voicemail was left.  10:08 AM [name] returned CSW call and confirmed that she is HCPOA not legal guardian and would bring paperwork in if not in chart.   12:29 PM CSW requested HCPOA from ALF. ALF faxed CSW HCPOA.  1:27 PM CSW placed HCPOA on hard chart. CSW informed medical team that patient has HCPOA not legal guardian.    Barriers to Discharge: Continued Medical Work up   Patient Goals and CMS Choice            Expected Discharge Plan and Services       Living arrangements for the past 2 months: Assisted Living Facility                                      Prior Living Arrangements/Services Living arrangements for the past 2 months: Assisted Living Facility Lives with:: Facility Resident Patient language and need for interpreter reviewed:: Yes        Need for Family Participation in Patient Care: Yes (Comment) Care giver support system in place?: Yes (comment)   Criminal Activity/Legal Involvement Pertinent to Current Situation/Hospitalization: No - Comment as needed  Activities of Daily Living   ADL Screening (condition at time of admission) Independently performs ADLs?: No Does the patient have a NEW difficulty with bathing/dressing/toileting/self-feeding that is expected to last >3 days?: Yes (Initiates electronic notice to provider for possible OT consult) Does the patient have a NEW difficulty with getting in/out of bed,  walking, or climbing stairs that is expected to last >3 days?: Yes (Initiates electronic notice to provider for possible PT consult) Does the patient have a NEW difficulty with communication that is expected to last >3 days?: Yes (Initiates electronic notice to provider for possible SLP consult) Is the patient deaf or have difficulty hearing?: No Does the patient have difficulty seeing, even when wearing glasses/contacts?: No Does the patient have difficulty concentrating, remembering, or making decisions?: No  Permission Sought/Granted Permission sought to share information with : Family Supports Permission granted to share information with : No (Contact information on chart)  Share Information with NAME: Marlana Latus     Permission granted to share info w Relationship: Daughter  Permission granted to share info w Contact Information: [phone]  Emotional Assessment       Orientation: : Oriented to Self, Oriented to Place, Oriented to  Time, Oriented to Situation Alcohol / Substance Use: Not Applicable Psych Involvement: No (comment)  Admission diagnosis:  Hypokalemia [E87.6] ICH (intracerebral hemorrhage) (HCC) [I61.9] Left-sided nontraumatic intracerebral hemorrhage, unspecified cerebral location [facility]) [I61.9] Patient Active Problem List   Diagnosis Date Noted   ICH (intracerebral hemorrhage) (HCC) 01/05/2024   Left displaced femoral neck fracture (HCC) 12/01/2022   Fall 12/01/2022   Lactic acidosis 12/01/2022   Nodule of middle lobe of right lung  12/01/2022   Paroxysmal atrial fibrillation (HCC) 12/01/2022   Chronic diastolic CHF (congestive heart failure) (HCC) 12/01/2022   GAD (generalized anxiety disorder) 12/01/2022   BPH (benign prostatic hyperplasia) 12/01/2022   COVID-19 virus infection 11/25/2021   History of intracranial hemorrhage 11/25/2021   Near syncope 11/25/2021   CAD (coronary artery disease)    Intracranial bleed (HCC) 01/15/2021   SAH (subarachnoid  hemorrhage) (HCC)    Non-traumatic rhabdomyolysis    Primary osteoarthritis of left knee 09/26/2019   S/P left knee arthroscopy 09/26/2019   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HCC) 09/25/2019   Adrenal insufficiency (HCC) 09/25/2019   Acute medial meniscus tear of left knee 08/09/2019   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 anxiety 02/19/2018   Diabetic peripheral neuropathy (HCC) 11/21/2017   BMI 21.0-21.9, adult 09/04/2015   GERD (gastroesophageal reflux disease) 07/09/2015   Autonomic postural hypotension 02/06/2015   CKD stage G3a/A1, GFR 45-59 and albumin creatinine ratio <30 mg/g (HCC) 03/20/2014   Medication management 02/06/2014   Vitamin D deficiency 02/06/2014   Gastric AV malformation 10/30/2012   History of colonic polyps 09/14/2011   DM2 (diabetes mellitus, type 2) (HCC) 03/12/2009   HLD (hyperlipidemia) 03/12/2009   Essential hypertension 03/12/2009   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) 03/12/2009   History of TIA (transient ischemic attack) 03/12/2009   Asthma 03/12/2009   COPD (chronic obstructive pulmonary disease) (HCC) 03/12/2009   GERD 03/12/2009   PCP:  [name], MD Pharmacy:  No Pharmacies Listed    Social Drivers of Health (SDOH) Social History: SDOH Screenings   Food Insecurity: No Food Insecurity (01/05/2024)  Housing: Low Risk  (01/05/2024)  Transportation Needs: No Transportation Needs (01/05/2024)  Utilities: Not At Risk (01/05/2024)  Depression (PHQ2-9): Low Risk  (06/30/2021)  Social Connections: Unknown (01/05/2024)  Tobacco Use: Medium Risk (01/05/2024)   SDOH Interventions:     Readmission Risk Interventions     No data to display

## 2024-01-06 NOTE — Progress Notes (Signed)
 OT Cancellation Note  Patient Details Name: John [name] MRN: [id number] DOB: 01-Sep-1935   Cancelled Treatment:    Reason Eval/Treat Not Completed: Active bedrest order.  Will continue efforts as appropriate.    [name] 01/06/2024, 10:41 AM 01/06/2024  RP, OTR/L  Acute Rehabilitation Services  Office:  [phone]

## 2024-01-06 NOTE — Progress Notes (Signed)
 PT Cancellation Note  Patient Details Name: John [name] MRN: [id number] DOB: 01-03-1935   Cancelled Treatment:    Reason Eval/Treat Not Completed: Active bedrest order   Enedina Finner Taos Tapp 01/06/2024, 6:54 AM Merryl Hacker, PT Acute Rehabilitation Services Office: [phone]

## 2024-01-06 NOTE — Evaluation (Signed)
 Speech Language Pathology Evaluation Patient Details Name: John [name] MRN: [id number] DOB: 12/27/1934 Today's Date: 01/06/2024 Time: 6387-5643 SLP Time Calculation (min) (ACUTE ONLY): 17 min  Problem List:  Patient Active Problem List   Diagnosis Date Noted   ICH (intracerebral hemorrhage) (HCC) 01/05/2024   Left displaced femoral neck fracture (HCC) 12/01/2022   Fall 12/01/2022   Lactic acidosis 12/01/2022   Nodule of middle lobe of right lung 12/01/2022   Paroxysmal atrial fibrillation (HCC) 12/01/2022   Chronic diastolic CHF (congestive heart failure) (HCC) 12/01/2022   GAD (generalized anxiety disorder) 12/01/2022   BPH (benign prostatic hyperplasia) 12/01/2022   COVID-19 virus infection 11/25/2021   History of intracranial hemorrhage 11/25/2021   Near syncope 11/25/2021   CAD (coronary artery disease)    Intracranial bleed (HCC) 01/15/2021   SAH (subarachnoid hemorrhage) (HCC)    Non-traumatic rhabdomyolysis    Primary osteoarthritis of left knee 09/26/2019   S/P left knee arthroscopy 09/26/2019   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HCC) 09/25/2019   Adrenal insufficiency (HCC) 09/25/2019   Acute medial meniscus tear of left knee 08/09/2019   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 anxiety 02/19/2018   Diabetic peripheral neuropathy (HCC) 11/21/2017   BMI 21.0-21.9, adult 09/04/2015   GERD (gastroesophageal reflux disease) 07/09/2015   Autonomic postural hypotension 02/06/2015   CKD stage G3a/A1, GFR 45-59 and albumin creatinine ratio <30 mg/g (HCC) 03/20/2014   Medication management 02/06/2014   Vitamin D deficiency 02/06/2014   Gastric AV malformation 10/30/2012   History of colonic polyps 09/14/2011   DM2 (diabetes mellitus, type 2) (HCC) 03/12/2009   HLD (hyperlipidemia) 03/12/2009   Essential hypertension 03/12/2009   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 s/p 2V CABG 1996. b. Last cath 12/2016 -> patent grafts, med rx.   CAD, ARTERY BYPASS GRAFT    Cataract    CKD (chronic kidney disease), stage II    Colon polyps    adenomatous polyps   COPD    Depression    Diabetes mellitus without complication (HCC)    no meds now   Diverticulosis    Duodenal ulcer without hemorrhage or perforation 12/29/2011   GASTROESOPHAGEAL REFLUX DISEASE    HYPERLIPIDEMIA    Insomnia    Leukodystrophy (HCC)    Memory loss    Nephrolithiasis    "I've had over 320 kidney stones" (03/20/2014)   NEPHROLITHIASIS, HX OF 03/12/2009   Parkinsonism (HCC)    Sleep apnea    does not use CPAP   Stroke (HCC)    TIA   SUPRAVENTRICULAR TACHYCARDIA, HX OF    SVT (supraventricular tachycardia) (HCC) 03/12/2009   TIA    Ulcer    Past Surgical History:  Past Surgical History:  Procedure Laterality Date   ANKLE FRACTURE SURGERY Left ~ 2012   CARDIAC CATHETERIZATION     COLONOSCOPY     CORONARY ARTERY BYPASS GRAFT  1996   CABG X2   CYSTOSCOPY WITH URETEROSCOPY, STONE BASKETRY AND STENT PLACEMENT     Cystourethroscopy with stent  removal.     EXCISIONAL HEMORRHOIDECTOMY     GASTRECTOMY     HIATAL HERNIA REPAIR     IRRIGATION AND DEBRIDEMENT SEBACEOUS CYST     "off my back   KNEE ARTHROSCOPY WITH MEDIAL MENISECTOMY Left 08/24/2019   Procedure: LEFT KNEE ARTHROSCOPY WITH PARTIAL MEDIAL MENISCECTOMY;  Surgeon: Tarry Kos, MD;  Location: [facility];  Service: Orthopedics;  Laterality: Left;   LEFT HEART CATH AND CORS/GRAFTS ANGIOGRAPHY N/A 12/23/2016   Procedure: Left Heart Cath and Cors/Grafts Angiography;  Surgeon:  Kathleene Hazel, MD;  Location: [facility] INVASIVE CV LAB;  Service: Cardiovascular;  Laterality: N/A;   POLYPECTOMY     PROSTATE SURGERY     "took the center of my prostate out"   TOTAL HIP ARTHROPLASTY Left 12/03/2022   Procedure: LEFT TOTAL HIP ARTHROPLASTY ANTERIOR APPROACH;  Surgeon: Tarry Kos, MD;  Location: [facility] OR;  Service: Orthopedics;  Laterality: Left;   UPPER GASTROINTESTINAL ENDOSCOPY     VAGOTOMY     HPI:  John [name] is an 89 yo male presenting to ED 3/13 from [facility] with R sided weakness. CTH revealed acute parenchymal hemorrhage within the L basal ganglia. PMH includes A-fib not on anticoagulation, CAD, CKD II, T2DM, HLD, TIA   Assessment / Plan / Recommendation Clinical Impression  Per pt's daughter, pt has baseline memory deficits. They report most significant concern with speech intelligibilty. Pt presents with mild R sided facial weakness, which results in a moderate dysarthria. SLP provided speech intelligibility strategies and encouraged pt to use these in conversation with his family. Pt is ~50% intelligible at the sentence level. He scored 19/30 on the SLUMS (a score of 27 or above is considered WFL) characterized by difficulty with memory, problem solving, and attention. Suspect multiple distractions throughout the session in addition to hearing and vision deficits affected performance overall. Pt and his daughter state performance this date appears consistent with baseline. SLP will f/u at least briefly for ongoing assessment and to target speech intelligibility.    SLP Assessment  SLP Recommendation/Assessment: Patient needs continued Speech Lanaguage Pathology Services SLP Visit Diagnosis: Dysarthria and anarthria (R47.1);Cognitive communication deficit (R41.841)    Recommendations for follow up therapy are one component of a multi-disciplinary discharge planning process, led by the attending physician.  Recommendations may be updated based on patient  status, additional functional criteria and insurance authorization.    Follow Up Recommendations  Skilled nursing-short term rehab (<3 hours/day)    Assistance Recommended at Discharge  Frequent or constant Supervision/Assistance  Functional Status Assessment Patient has had a recent decline in their functional status and demonstrates the ability to make significant improvements in function in a reasonable and predictable amount of time.  Frequency and Duration min 2x/week  1 week      SLP Evaluation Cognition  Overall Cognitive Status: History of cognitive impairments - at baseline Arousal/Alertness: Awake/alert Orientation Level: Oriented X4 Attention: Selective Selective Attention: Impaired Selective Attention Impairment: Verbal basic Memory: Impaired Memory Impairment: Retrieval deficit Awareness: Appears intact Problem Solving: Impaired Problem Solving Impairment: Verbal basic       Comprehension  Auditory Comprehension Overall Auditory Comprehension: Appears within functional limits for tasks assessed    Expression Expression Primary Mode of Expression: Verbal Verbal Expression Overall Verbal Expression: Appears within functional limits for tasks assessed   Oral / Motor  Oral Motor/Sensory Function Overall Oral Motor/Sensory Function: Mild impairment Facial ROM: Reduced right;Suspected CN VII (facial) dysfunction Facial Symmetry: Abnormal symmetry  right;Suspected CN VII (facial) dysfunction Facial Strength: Reduced right;Suspected CN VII (facial) dysfunction Motor Speech Overall Motor Speech: Appears within functional limits for tasks assessed            Gwynneth Aliment, M.A., CF-SLP Speech Language Pathology, Acute Rehabilitation Services  Secure Chat preferred [phone]  01/06/2024, 5:02 PM

## 2024-01-06 NOTE — Progress Notes (Signed)
 eLink Physician-Brief Progress Note Patient Name: John [name] DOB: 18-Nov-1934 MRN: [id number]   Date of Service  01/06/2024  HPI/Events of Note  K3.4  eICU Interventions  KCl p.o. ordered Check magnesium again in the morning     Intervention Category Minor Interventions: Electrolytes abnormality - evaluation and management  John [name] 01/06/2024, 10:58 PM

## 2024-01-06 NOTE — Plan of Care (Signed)
  Problem: Education: Goal: Knowledge of disease or condition will improve Outcome: Progressing Goal: Knowledge of secondary prevention will improve (MUST DOCUMENT ALL) Outcome: Progressing Goal: Knowledge of patient specific risk factors will improve (DELETE if not current risk factor) Outcome: Progressing   Problem: Intracerebral Hemorrhage Tissue Perfusion: Goal: Complications of Intracerebral Hemorrhage will be minimized Outcome: Progressing   Problem: Coping: Goal: Will verbalize positive feelings about self Outcome: Progressing Goal: Will identify appropriate support needs Outcome: Progressing   Problem: Health Behavior/Discharge Planning: Goal: Ability to manage health-related needs will improve Outcome: Progressing Goal: Goals will be collaboratively established with patient/family Outcome: Progressing   Problem: Self-Care: Goal: Ability to participate in self-care as condition permits will improve Outcome: Progressing Goal: Verbalization of feelings and concerns over difficulty with self-care will improve Outcome: Progressing Goal: Ability to communicate needs accurately will improve Outcome: Progressing

## 2024-01-07 DIAGNOSIS — E785 Hyperlipidemia, unspecified: Secondary | ICD-10-CM

## 2024-01-07 DIAGNOSIS — I610 Nontraumatic intracerebral hemorrhage in hemisphere, subcortical: Secondary | ICD-10-CM | POA: Diagnosis not present

## 2024-01-07 DIAGNOSIS — I739 Peripheral vascular disease, unspecified: Secondary | ICD-10-CM

## 2024-01-07 LAB — GLUCOSE, CAPILLARY
Glucose-Capillary: 181 mg/dL — ABNORMAL HIGH (ref 70–99)
Glucose-Capillary: 146 mg/dL — ABNORMAL HIGH (ref 70–99)
Glucose-Capillary: 158 mg/dL — ABNORMAL HIGH (ref 70–99)
Glucose-Capillary: 124 mg/dL — ABNORMAL HIGH (ref 70–99)

## 2024-01-07 LAB — CBC
HCT: 36.4 % — ABNORMAL LOW (ref 39.0–52.0)
Hemoglobin: 12.3 g/dL — ABNORMAL LOW (ref 13.0–17.0)
MCH: 34 pg (ref 26.0–34.0)
MCHC: 33.8 g/dL (ref 30.0–36.0)
MCV: 100.6 fL — ABNORMAL HIGH (ref 80.0–100.0)
Platelets: 209 10*3/uL (ref 150–400)
RBC: 3.62 MIL/uL — ABNORMAL LOW (ref 4.22–5.81)
RDW: 14.2 % (ref 11.5–15.5)
WBC: 11.6 10*3/uL — ABNORMAL HIGH (ref 4.0–10.5)
nRBC: 0 % (ref 0.0–0.2)

## 2024-01-07 LAB — BASIC METABOLIC PANEL
Anion gap: 12 (ref 5–15)
BUN: 14 mg/dL (ref 8–23)
CO2: 24 mmol/L (ref 22–32)
Calcium: 8.5 mg/dL — ABNORMAL LOW (ref 8.9–10.3)
Chloride: 106 mmol/L (ref 98–111)
Creatinine, Ser: 1.23 mg/dL (ref 0.61–1.24)
GFR, Estimated: 57 mL/min — ABNORMAL LOW (ref >60)
Glucose, Bld: 136 mg/dL — ABNORMAL HIGH (ref 70–99)
Potassium: 3.3 mmol/L — ABNORMAL LOW (ref 3.5–5.1)
Sodium: 142 mmol/L (ref 135–145)

## 2024-01-07 LAB — LIPID PANEL
Cholesterol: 191 mg/dL (ref 0–200)
HDL: 46 mg/dL (ref >40)
LDL Cholesterol: 121 mg/dL — ABNORMAL HIGH (ref 0–99)
Total CHOL/HDL Ratio: 4.2 ratio
Triglycerides: 118 mg/dL (ref <150)
VLDL: 24 mg/dL (ref 0–40)

## 2024-01-07 LAB — MAGNESIUM: Magnesium: 2.1 mg/dL (ref 1.7–2.4)

## 2024-01-07 MED ORDER — ESCITALOPRAM OXALATE 10 MG PO TABS
10.0000 mg | ORAL_TABLET | Freq: Every day | ORAL | Status: DC
  Administered 2024-01-07: 10 mg via ORAL
  Filled 2024-01-07 (×2): qty 1

## 2024-01-07 MED ORDER — METOPROLOL TARTRATE 25 MG PO TABS
25.0000 mg | ORAL_TABLET | Freq: Two times a day (BID) | ORAL | Status: DC
  Administered 2024-01-07 (×2): 25 mg via ORAL
  Filled 2024-01-07 (×3): qty 1

## 2024-01-07 MED ORDER — ATORVASTATIN CALCIUM 40 MG PO TABS
40.0000 mg | ORAL_TABLET | Freq: Every day | ORAL | Status: DC
  Administered 2024-01-07: 40 mg via ORAL
  Filled 2024-01-07 (×2): qty 1

## 2024-01-07 MED ORDER — HYDRALAZINE HCL 20 MG/ML IJ SOLN
10.0000 mg | INTRAMUSCULAR | Status: AC | PRN
  Administered 2024-01-07 – 2024-01-09 (×3): 10 mg via INTRAMUSCULAR
  Filled 2024-01-07 (×3): qty 1

## 2024-01-07 MED ORDER — AMLODIPINE BESYLATE 5 MG PO TABS
10.0000 mg | ORAL_TABLET | Freq: Every day | ORAL | Status: DC
  Administered 2024-01-07: 10 mg via ORAL
  Filled 2024-01-07: qty 2
  Filled 2024-01-07: qty 1

## 2024-01-07 MED ORDER — POTASSIUM CHLORIDE CRYS ER 20 MEQ PO TBCR: 40.0000 meq | EXTENDED_RELEASE_TABLET | Freq: Once | ORAL | Status: DC

## 2024-01-07 MED ORDER — TAMSULOSIN HCL 0.4 MG PO CAPS
0.4000 mg | ORAL_CAPSULE | Freq: Every day | ORAL | Status: DC
  Administered 2024-01-07 – 2024-01-13 (×5): 0.4 mg via ORAL
  Filled 2024-01-07 (×5): qty 1

## 2024-01-07 NOTE — Progress Notes (Signed)
   01/07/24 1745  Output (mL)  Urine 0 mL  Unmeasured Output  Stool Occurrence 1  Urine Characteristics  Urinary Interventions Bladder scan  Bladder Scan Volume (mL) 320 mL     Patient's abdomen is distended and taunt. See the above results for bladder scan. Primary RN informed.

## 2024-01-07 NOTE — Progress Notes (Addendum)
 STROKE TEAM PROGRESS NOTE    SIGNIFICANT HOSPITAL EVENTS 3/13-patient admitted with left basal ganglia ICH  INTERIM HISTORY/SUBJECTIVE RN at the bedside. Pt eyes open but lethargic, BP mildly eleveated on PRN IV injections, off cleviprex, add metoprolol for BP control. Moving all extremities per commands, PT recommended SNF  OBJECTIVE  CBC    Component Value Date/Time   WBC 11.6 (H) 01/07/2024 0321   RBC 3.62 (L) 01/07/2024 0321   HGB 12.3 (L) 01/07/2024 0321   HGB 13.4 12/15/2016 1705   HCT 36.4 (L) 01/07/2024 0321   HCT 39.2 12/15/2016 1705   PLT 209 01/07/2024 0321   PLT 206 12/15/2016 1705   MCV 100.6 (H) 01/07/2024 0321   MCV 92 12/15/2016 1705   MCH 34.0 01/07/2024 0321   MCHC 33.8 01/07/2024 0321   RDW 14.2 01/07/2024 0321   RDW 13.4 12/15/2016 1705   LYMPHSABS 1.5 01/05/2024 1247   LYMPHSABS 1.8 12/15/2016 1705   MONOABS 0.4 01/05/2024 1247   EOSABS 0.2 01/05/2024 1247   EOSABS 0.1 12/15/2016 1705   BASOSABS 0.0 01/05/2024 1247   BASOSABS 0.0 12/15/2016 1705    BMET    Component Value Date/Time   NA 142 01/07/2024 0321   NA 144 12/15/2016 1705   K 3.3 (L) 01/07/2024 0321   CL 106 01/07/2024 0321   CO2 24 01/07/2024 0321   GLUCOSE 136 (H) 01/07/2024 0321   BUN 14 01/07/2024 0321   BUN 21 12/15/2016 1705   CREATININE 1.23 01/07/2024 0321   CREATININE 1.21 12/30/2022 1623   CALCIUM 8.5 (L) 01/07/2024 0321   EGFR 58 (L) 12/30/2022 1623   GFRNONAA 57 (L) 01/07/2024 0321   GFRNONAA 52 (L) 07/24/2020 1233    IMAGING past 24 hours MR BRAIN W WO CONTRAST Result Date: 01/06/2024 CLINICAL DATA:  Stroke follow-up EXAM: MRI HEAD WITHOUT AND WITH CONTRAST TECHNIQUE: Multiplanar, multiecho pulse sequences of the brain and surrounding structures were obtained without and with intravenous contrast. CONTRAST:  8mL GADAVIST GADOBUTROL 1 MMOL/ML IV SOLN COMPARISON:  None Available. FINDINGS: Brain: No acute infarct, mass effect or extra-axial collection. Unchanged  appearance of left basal ganglia intraparenchymal hematoma. Chronic blood products over both frontal poles. There is confluent hyperintense T2-weighted signal within the white matter. Generalized volume loss. The midline structures are normal. There is no abnormal contrast enhancement. Vascular: Normal flow voids. Skull and upper cervical spine: Normal calvarium and skull base. Visualized upper cervical spine and soft tissues are normal. Sinuses/Orbits:No paranasal sinus fluid levels or advanced mucosal thickening. No mastoid or middle ear effusion. Normal orbits. IMPRESSION: 1. Unchanged appearance of left basal ganglia intraparenchymal hematoma. 2. No acute infarct. Electronically Signed   By: Deatra Robinson M.D.   On: 01/06/2024 21:29    Vitals:   01/07/24 0757 01/07/24 0800 01/07/24 0926 01/07/24 1138  BP:  (!) 152/64 (!) 149/68   Pulse:  100    Resp:  (!) 25    Temp: 99 F (37.2 C)   98.8 F (37.1 C)  TempSrc: Oral   Oral  SpO2:  97%    Weight:      Height:         PHYSICAL EXAM General:  Alert, well-nourished, well-developed elderly Caucasian male in no acute distress CV: Rhythm regular, not in afib  Respiratory:  Regular, unlabored respirations on room air GI: Abdomen soft and nontender  Neuro - awake, alert, but lethargic, eyes open, orientated to age, place, time but with moderate dysarthria. No aphasia but paraphasic errors  frequently and became word salad with long sentences. Able to name 3/4 but with mild perseveration. Able to repeat short sentence in dysarthric voice but with longer sentence he became word salad. No gaze palsy, tracking bilaterally, visual field full, PERRL. Right facial droop. Tongue protrusion to the right. LUE 4/5 proximal and 5/5 distally. RUE 4-/5 with pronator drift but distally 5/5. BLEs proximal 4/5 and 5/5 distally. Sensation symmetrical bilaterally subjectively, b/l FTN intact but slow, gait not tested.     ASSESSMENT/PLAN  [name]  is a 89 y.o. male with history of A-fib not on anticoagulation, CAD, CKD stage II, diabetes and hyperlipidemia admitted for acute onset right-sided weakness resulting in a fall.  Patient was found to have a small ICH in the left basal ganglia on CT.  He reports that his symptoms have improved somewhat overnight. NIH on Admission 8  ICH:  left BG ICH, etiology likely hypertensive with small vessel disease Code Stroke CT head 9 mm acute IPH centered within left basal ganglia with surrounding edema, chronic encephalomalacia in right superior frontal gyrus at site of old IPH, background atrophy and chronic small vessel ischemic disease CTA head & neck no LVO, AVM, spot sign or aneurysm, generalized arterial tortuosity MRI with and without contrast stable left BG ICH 2D Echo EF 60 to 65%, moderately dilated left atrium  LDL 121 HgbA1c 5.2 VTE prophylaxis -lovenox No antithrombotic prior to admission, now on No antithrombotic secondary to ICH Therapy recommendations: SNF Disposition: Pending  Hx of PAF Home Meds: None Per daughter, pt probably had one brief PAF while he had hip surgery in the hospital but no further issue after that Currently in sinus rhythm He follows with Dr. Clifton James as outpt No AC at this time due to acute ICH May consider watchman device or ASPIRE trial, follow up with neurology and cardiology as outpt.   Hypertension Home meds: Amlodipine 5 mg daily Off Cleviprex now In norvasc 10 and metoprolol 25 bid BP goal < 160 Long term BP goal normotensive  Hyperlipidemia Home meds: None LDL 121, goal < 70 Will add lipitor 40 Continue statin on discharge  Diabetes type II Controlled Home meds: None HgbA1c 5.2, goal < 7.0 CBGs SSI Recommend close follow-up with PCP for better DM control  Other Stroke Risk Factors Coronary artery disease Advanced age  Other Active Problems CKD 3a, Cre 1.25-1.23  Hospital day # 2  This patient is critically ill due to ICH and  PAF and at significant risk of neurological worsening, death form hematoma expansion, heart failure, acute stroke. This patient's care requires constant monitoring of vital signs, hemodynamics, respiratory and cardiac monitoring, review of multiple databases, neurological assessment, discussion with family, other specialists and medical decision making of high complexity. I spent 35 minutes of neurocritical care time in the care of this patient. I had long discussion with daughter over the phone, updated pt current condition, treatment plan and potential prognosis, and answered all the questions.  She expressed understanding and appreciation.    Marvel Plan, MD PhD Stroke Neurology 01/07/2024 12:08 PM    To contact Stroke Continuity provider, please refer to [email or web address]. After hours, contact General Neurology

## 2024-01-07 NOTE — Progress Notes (Signed)
 OT Cancellation Note  Patient Details Name: John [name] MRN: [id number] DOB: June 21, 1935   Cancelled Treatment:    Reason Eval/Treat Not Completed: Other (comment) (Pt trasnferred to new unit, RN assessing pt upon arrival, asked OT to come back later. Will follow up for OT eval as schedule permits)  Carver Fila, OTD, OTR/L SecureChat Preferred Acute Rehab [phone]   [name] 01/07/2024, 1:20 PM

## 2024-01-07 NOTE — Progress Notes (Signed)
   01/07/24 1819  Urine Characteristics  Intermittent/Straight Cath (mL) 500 mL  Post Void Cath Residual (mL) 0 mL  Hygiene Peri care     I/O cath per order/protocol.

## 2024-01-07 NOTE — Evaluation (Signed)
 Physical Therapy Evaluation Patient Details Name: John [name] MRN: [id number] DOB: [birth year] Today's Date: 01/07/2024  History of Present Illness  Pt is 89 year old presented to [facility] on  01/05/24 for sudden onset of rt sided weakness. Pt found to have lt basal ganglia ICH. PMH - Lt hip fx, cabg, paroxysmal atrial fibrillation, dm, hypertension, OSA, chronic diastolic heart failure  Clinical Impression  Pt admitted with above diagnosis and presents to PT with functional limitations due to deficits listed below (See PT problem list). Pt needs skilled PT to maximize independence and safety. Pt lives at [facility] and per pt he typically amb in facility on his own with rolling walker. Pt now with decr in balance and requiring assist for mobility. Patient will benefit from continued inpatient follow up therapy, <3 hours/day. If ALF can provide incr assist with mobility could consider return there with PT.           If plan is discharge home, recommend the following: A little help with walking and/or transfers;A little help with bathing/dressing/bathroom;Direct supervision/assist for medications management;Assist for transportation   Can travel by private vehicle   Yes    Equipment Recommendations None recommended by PT  Recommendations for Other Services       Functional Status Assessment Patient has had a recent decline in their functional status and demonstrates the ability to make significant improvements in function in a reasonable and predictable amount of time.     Precautions / Restrictions Precautions Precautions: Fall;Other (comment) Precaution/Restrictions Comments: Goal SBP < 160      Mobility  Bed Mobility Overal bed mobility: Needs Assistance Bed Mobility: Supine to Sit     Supine to sit: Contact guard, HOB elevated     General bed mobility comments: Assist for safety and rails    Transfers Overall transfer level: Needs  assistance Equipment used: Rolling walker (2 wheels) Transfers: Sit to/from Stand Sit to Stand: Min assist           General transfer comment: Assist to power up and for balance    Ambulation/Gait Ambulation/Gait assistance: Min assist Gait Distance (Feet): 150 Feet Assistive device: Rolling walker (2 wheels) Gait Pattern/deviations: Step-through pattern, Decreased stride length, Drifts right/left, Trunk flexed Gait velocity: decr initially and then pt amb faster than is safe for his balance Gait velocity interpretation: 1.31 - 2.62 ft/sec, indicative of limited community ambulator   General Gait Details: Assist for balance and to correct slight veer to lt. As pt fatigued pt trying to move faster with balance worsening.  Stairs            Wheelchair Mobility     Tilt Bed    Modified Rankin (Stroke Patients Only)       Balance Overall balance assessment: Needs assistance Sitting-balance support: No upper extremity supported, Feet supported Sitting balance-Leahy Scale: Fair     Standing balance support: Bilateral upper extremity supported, During functional activity Standing balance-Leahy Scale: Poor Standing balance comment: UE support and CGA for static standing                             Pertinent Vitals/Pain Pain Assessment Pain Assessment: No/denies pain    Home Living Family/patient expects to be discharged to:: Assisted living     Type of Home: Other(Comment)           Home Equipment: Agricultural consultant (2 wheels) Additional Comments: [facility]  Prior Function Prior Level of Function : Needs assist       Physical Assist : ADLs (physical)   ADLs (physical): IADLs;Bathing;Dressing Mobility Comments: Modified independent with rolling walker per pt ADLs Comments: Per prior encounter assist with bathing, dressing, IADLs     Extremity/Trunk Assessment   Upper Extremity Assessment Upper Extremity Assessment:  Defer to OT evaluation    Lower Extremity Assessment Lower Extremity Assessment: Generalized weakness    Cervical / Trunk Assessment Cervical / Trunk Assessment: Normal  Communication   Communication Communication: Impaired Factors Affecting Communication: Difficulty expressing self;Reduced clarity of speech    Cognition Arousal: Alert Behavior During Therapy: Impulsive   PT - Cognitive impairments: Safety/Judgement, Difficult to assess Difficult to assess due to: Impaired communication                     PT - Cognition Comments: Pt slightly impulsive and with decr safety awareness Following commands: Impaired Following commands impaired: Only follows one step commands consistently, Follows multi-step commands inconsistently     Cueing Cueing Techniques: Verbal cues, Tactile cues     General Comments General comments (skin integrity, edema, etc.): VSS on RA    Exercises     Assessment/Plan    PT Assessment Patient needs continued PT services  PT Problem List Decreased strength;Decreased balance;Decreased activity tolerance;Decreased mobility;Decreased safety awareness       PT Treatment Interventions DME instruction;Gait training;Functional mobility training;Therapeutic activities;Therapeutic exercise;Balance training;Patient/family education    PT Goals (Current goals can be found in the Care Plan section)  Acute Rehab PT Goals Patient Stated Goal: not stated PT Goal Formulation: With patient Time For Goal Achievement: 01/21/24 Potential to Achieve Goals: Good    Frequency Min 2X/week     Co-evaluation               AM-PAC PT "6 Clicks" Mobility  Outcome Measure Help needed turning from your back to your side while in a flat bed without using bedrails?: A Little Help needed moving from lying on your back to sitting on the side of a flat bed without using bedrails?: A Little Help needed moving to and from a bed to a chair (including a  wheelchair)?: A Little Help needed standing up from a chair using your arms (e.g., wheelchair or bedside chair)?: A Little Help needed to walk in hospital room?: A Little Help needed climbing 3-5 steps with a railing? : Total 6 Click Score: 16    End of Session Equipment Utilized During Treatment: Gait belt Activity Tolerance: Patient tolerated treatment well Patient left: in chair;with call bell/phone within reach;with chair alarm set Nurse Communication: Mobility status;Other (comment) (left O2 off) PT Visit Diagnosis: Unsteadiness on feet (R26.81);Other abnormalities of gait and mobility (R26.89);Muscle weakness (generalized) (M62.81);History of falling (Z91.81)    Time: 4696-2952 PT Time Calculation (min) (ACUTE ONLY): 20 min   Charges:   PT Evaluation $PT Eval Moderate Complexity: 1 Mod   PT General Charges $$ ACUTE PT VISIT: 1 Visit         [facility] PT Acute Rehabilitation Services Office [phone]   Angelina Ok [facility] 01/07/2024, 10:17 AM

## 2024-01-07 NOTE — Plan of Care (Signed)
  Problem: Education: Goal: Knowledge of disease or condition will improve Outcome: Progressing   Problem: Intracerebral Hemorrhage Tissue Perfusion: Goal: Complications of Intracerebral Hemorrhage will be minimized Outcome: Progressing   Problem: Health Behavior/Discharge Planning: Goal: Ability to manage health-related needs will improve Outcome: Progressing Goal: Goals will be collaboratively established with patient/family Outcome: Progressing   Problem: Nutrition: Goal: Risk of aspiration will decrease Outcome: Progressing Goal: Dietary intake will improve Outcome: Progressing   Problem: Fluid Volume: Goal: Ability to maintain a balanced intake and output will improve Outcome: Progressing   Problem: Metabolic: Goal: Ability to maintain appropriate glucose levels will improve Outcome: Progressing   Problem: Skin Integrity: Goal: Risk for impaired skin integrity will decrease Outcome: Progressing   Problem: Clinical Measurements: Goal: Ability to maintain clinical measurements within normal limits will improve Outcome: Progressing

## 2024-01-08 ENCOUNTER — Inpatient Hospital Stay (HOSPITAL_COMMUNITY)

## 2024-01-08 DIAGNOSIS — E785 Hyperlipidemia, unspecified: Secondary | ICD-10-CM | POA: Diagnosis not present

## 2024-01-08 DIAGNOSIS — I610 Nontraumatic intracerebral hemorrhage in hemisphere, subcortical: Secondary | ICD-10-CM | POA: Diagnosis not present

## 2024-01-08 DIAGNOSIS — I739 Peripheral vascular disease, unspecified: Secondary | ICD-10-CM | POA: Diagnosis not present

## 2024-01-08 LAB — BASIC METABOLIC PANEL
Anion gap: 12 (ref 5–15)
BUN: 19 mg/dL (ref 8–23)
CO2: 24 mmol/L (ref 22–32)
Calcium: 8.8 mg/dL — ABNORMAL LOW (ref 8.9–10.3)
Chloride: 108 mmol/L (ref 98–111)
Creatinine, Ser: 1.32 mg/dL — ABNORMAL HIGH (ref 0.61–1.24)
GFR, Estimated: 52 mL/min — ABNORMAL LOW (ref >60)
Glucose, Bld: 130 mg/dL — ABNORMAL HIGH (ref 70–99)
Potassium: 3.2 mmol/L — ABNORMAL LOW (ref 3.5–5.1)
Sodium: 144 mmol/L (ref 135–145)

## 2024-01-08 LAB — GLUCOSE, CAPILLARY
Glucose-Capillary: 129 mg/dL — ABNORMAL HIGH (ref 70–99)
Glucose-Capillary: 157 mg/dL — ABNORMAL HIGH (ref 70–99)
Glucose-Capillary: 140 mg/dL — ABNORMAL HIGH (ref 70–99)
Glucose-Capillary: 137 mg/dL — ABNORMAL HIGH (ref 70–99)

## 2024-01-08 LAB — CBC
HCT: 37 % — ABNORMAL LOW (ref 39.0–52.0)
Hemoglobin: 12.5 g/dL — ABNORMAL LOW (ref 13.0–17.0)
MCH: 34.2 pg — ABNORMAL HIGH (ref 26.0–34.0)
MCHC: 33.8 g/dL (ref 30.0–36.0)
MCV: 101.4 fL — ABNORMAL HIGH (ref 80.0–100.0)
Platelets: 210 10*3/uL (ref 150–400)
RBC: 3.65 MIL/uL — ABNORMAL LOW (ref 4.22–5.81)
RDW: 14.2 % (ref 11.5–15.5)
WBC: 13.5 10*3/uL — ABNORMAL HIGH (ref 4.0–10.5)
nRBC: 0 % (ref 0.0–0.2)

## 2024-01-08 MED ORDER — DEXTROSE-SODIUM CHLORIDE 5-0.9 % IV SOLN
INTRAVENOUS | Status: AC
Start: 2024-01-08 — End: 2024-01-09

## 2024-01-08 MED ORDER — SODIUM CHLORIDE 0.9 % IV SOLN
3.0000 g | Freq: Four times a day (QID) | INTRAVENOUS | Status: DC
  Administered 2024-01-08 – 2024-01-12 (×16): 3 g via INTRAVENOUS
  Filled 2024-01-08 (×16): qty 8

## 2024-01-08 MED ORDER — DEXTROSE 5 % IV SOLN: INTRAVENOUS | Status: DC

## 2024-01-08 MED ORDER — LORAZEPAM 2 MG/ML IJ SOLN
0.5000 mg | Freq: Every evening | INTRAMUSCULAR | Status: DC | PRN
  Administered 2024-01-11 – 2024-01-13 (×2): 0.5 mg via INTRAVENOUS
  Filled 2024-01-08 (×2): qty 1

## 2024-01-08 NOTE — Plan of Care (Signed)
  Problem: Education: Goal: Knowledge of disease or condition will improve Outcome: Progressing   Problem: Intracerebral Hemorrhage Tissue Perfusion: Goal: Complications of Intracerebral Hemorrhage will be minimized Outcome: Progressing   Problem: Health Behavior/Discharge Planning: Goal: Ability to manage health-related needs will improve Outcome: Progressing   Problem: Self-Care: Goal: Ability to participate in self-care as condition permits will improve Outcome: Progressing Goal: Ability to communicate needs accurately will improve Outcome: Progressing   Problem: Nutrition: Goal: Risk of aspiration will decrease Outcome: Progressing   Problem: Metabolic: Goal: Ability to maintain appropriate glucose levels will improve Outcome: Progressing   Problem: Skin Integrity: Goal: Risk for impaired skin integrity will decrease Outcome: Progressing   Problem: Education: Goal: Knowledge of General Education information will improve Description: Including pain rating scale, medication(s)/side effects and non-pharmacologic comfort measures Outcome: Progressing   Problem: Clinical Measurements: Goal: Ability to maintain clinical measurements within normal limits will improve Outcome: Progressing Goal: Will remain free from infection Outcome: Progressing   Problem: Pain Managment: Goal: General experience of comfort will improve and/or be controlled Outcome: Progressing   Problem: Safety: Goal: Ability to remain free from injury will improve Outcome: Progressing   Problem: Skin Integrity: Goal: Risk for impaired skin integrity will decrease Outcome: Progressing   Problem: Coping: Goal: Level of anxiety will decrease Outcome: Not Progressing   Problem: Elimination: Goal: Will not experience complications related to bowel motility Outcome: Not Progressing Goal: Will not experience complications related to urinary retention Outcome: Not Progressing

## 2024-01-08 NOTE — Evaluation (Signed)
 Occupational Therapy Evaluation Patient Details Name: John [name] MRN: [id number] DOB: August 22, 1935 Today's Date: 01/08/2024   History of Present Illness   Pt is 89 year old presented to [facility] on  01/05/24 for sudden onset of rt sided weakness. Pt found to have lt basal ganglia ICH. PMH - Lt hip fx, cabg, paroxysmal atrial fibrillation, dm, hypertension, OSA, chronic diastolic heart failure     Clinical Impressions Patient admitted for the diagnosis above.  PTA he resides at a local ALF, used RW for mobility,and did receive light assist for ADL, patient lethargic, unable to get a clear PLOF.  Presents with decreased LOA, SOB, poor activity tolerance, poor static and dynamic balance and ? R inattention.  Patient failed MBS 3/16 and NPO.  Currently he is needing Mod to Max A for ADL completion and Min A for simple transfers.  OT will follow in the acute setting to address deficits, and Patient will benefit from continued inpatient follow up therapy, <3 hours/day.     If plan is discharge home, recommend the following:   A lot of help with bathing/dressing/bathroom;A little help with walking and/or transfers;Assist for transportation;Assistance with cooking/housework;Help with stairs or ramp for entrance;Supervision due to cognitive status     Functional Status Assessment   Patient has had a recent decline in their functional status and demonstrates the ability to make significant improvements in function in a reasonable and predictable amount of time.     Equipment Recommendations   BSC/3in1     Recommendations for Other Services         Precautions/Restrictions   Precautions Precautions: Fall;Other (comment) Precaution/Restrictions Comments: Goal SBP < 160 Restrictions Weight Bearing Restrictions Per Provider Order: No     Mobility Bed Mobility Overal bed mobility: Needs Assistance Bed Mobility: Supine to Sit, Sit to Supine     Supine to sit: Min assist, HOB  elevated Sit to supine: Min assist     Patient Response: Cooperative  Transfers Overall transfer level: Needs assistance Equipment used: Rolling walker (2 wheels) Transfers: Sit to/from Stand Sit to Stand: Min assist                  Balance Overall balance assessment: Needs assistance Sitting-balance support: No upper extremity supported, Feet supported Sitting balance-Leahy Scale: Poor   Postural control: Posterior lean Standing balance support: Reliant on assistive device for balance Standing balance-Leahy Scale: Poor                             ADL either performed or assessed with clinical judgement   ADL Overall ADL's : Needs assistance/impaired Eating/Feeding: NPO   Grooming: Wash/dry hands;Wash/dry face;Contact guard assist;Minimal assistance;Sitting   Upper Body Bathing: Moderate assistance;Sitting   Lower Body Bathing: Maximal assistance;Sitting/lateral leans   Upper Body Dressing : Moderate assistance;Sitting   Lower Body Dressing: Maximal assistance;Sit to/from stand   Toilet Transfer: Minimal assistance;Rolling walker (2 wheels);Stand-pivot;BSC/3in1   Toileting- Architect and Hygiene: Maximal assistance;Sit to/from stand               Vision   Vision Assessment?: Vision impaired- to be further tested in functional context     Perception Perception: Impaired Preception Impairment Details: Inattention/Neglect Perception-Other Comments: Inattention to the R visual foeld.   Praxis Praxis: [facility]       Pertinent Vitals/Pain Pain Assessment Pain Assessment: Faces Faces Pain Scale: No hurt Pain Intervention(s): Monitored during session     Extremity/Trunk  Assessment Upper Extremity Assessment Upper Extremity Assessment: Generalized weakness;Right hand dominant;RUE deficits/detail RUE Deficits / Details: Full AROM, tends to lose R UE behind him, cues for placement of RUE RUE Sensation: decreased  proprioception;decreased light touch RUE Coordination: decreased gross motor   Lower Extremity Assessment Lower Extremity Assessment: Defer to PT evaluation   Cervical / Trunk Assessment Cervical / Trunk Assessment: Normal   Communication Communication Communication: Impaired Factors Affecting Communication: Difficulty expressing self;Reduced clarity of speech   Cognition Arousal: Lethargic Behavior During Therapy: Flat affect Cognition: No family/caregiver present to determine baseline                               Following commands: Impaired Following commands impaired: Follows one step commands inconsistently     Cueing  General Comments   Cueing Techniques: Verbal cues;Tactile cues   Watch HR and sats.     Exercises     Shoulder Instructions      Home Living Family/patient expects to be discharged to:: Assisted living                             Home Equipment: Rolling Walker (2 wheels)   Additional Comments: [facility]      Prior Functioning/Environment Prior Level of Function : Needs assist       Physical Assist : ADLs (physical)   ADLs (physical): IADLs;Bathing;Dressing Mobility Comments: Modified independent with rolling walker per pt ADLs Comments: Per prior encounter assist with bathing, dressing, IADLs    OT Problem List: Decreased strength;Decreased activity tolerance;Impaired balance (sitting and/or standing);Decreased cognition;Decreased safety awareness;Decreased knowledge of use of DME or AE   OT Treatment/Interventions: Self-care/ADL training;Therapeutic exercise;Therapeutic activities;Energy conservation;DME and/or AE instruction;Patient/family education;Balance training      OT Goals(Current goals can be found in the care plan section)   Acute Rehab OT Goals OT Goal Formulation: Patient unable to participate in goal setting Time For Goal Achievement: 01/23/24 Potential to Achieve Goals:  Fair   OT Frequency:  Min 1X/week    Co-evaluation              AM-PAC OT "6 Clicks" Daily Activity     Outcome Measure Help from another person eating meals?: Total Help from another person taking care of personal grooming?: A Little Help from another person toileting, which includes using toliet, bedpan, or urinal?: A Lot Help from another person bathing (including washing, rinsing, drying)?: A Lot Help from another person to put on and taking off regular upper body clothing?: A Lot Help from another person to put on and taking off regular lower body clothing?: A Lot 6 Click Score: 12   End of Session Equipment Utilized During Treatment: Gait belt;Rolling walker (2 wheels) Nurse Communication: Mobility status  Activity Tolerance: Patient limited by fatigue Patient left: in bed;with call bell/phone within reach;with bed alarm set  OT Visit Diagnosis: Unsteadiness on feet (R26.81);Muscle weakness (generalized) (M62.81);Other symptoms and signs involving cognitive function                Time: 1459-1521 OT Time Calculation (min): 22 min Charges:  OT General Charges $OT Visit: 1 Visit OT Evaluation $OT Eval Moderate Complexity: 1 Mod  01/08/2024  RP, OTR/L  Acute Rehabilitation Services  Office:  [phone]   Suzanna Obey 01/08/2024, 3:28 PM

## 2024-01-08 NOTE — Progress Notes (Signed)
 Pharmacy Antibiotic Note  John [name] is a 89 y.o. male admitted on 01/05/2024 with  aspiration pneumonia .  Pharmacy has been consulted for Unasyn dosing.  Patient here with ICH. WBC 13.5, temp 99.22F, CXR on 3/13 with patchy atelectasis or infiltrate at the left base.  Plan: Start Unasyn 3g IV q6h Monitor for changes in renal function, clinical status, and culture data F/u length of treatment  Height: 6' (182.9 cm) Weight: 82.6 kg (182 lb 1.6 oz) IBW/kg (Calculated) : 77.6  Temp (24hrs), Avg:98.7 F (37.1 C), Min:98.1 F (36.7 C), Max:99.8 F (37.7 C)  Recent Labs  Lab 01/05/24 1247 01/05/24 1251 01/06/24 0339 01/06/24 1642 01/07/24 0321 01/08/24 0630  WBC 4.6  --   --   --  11.6* 13.5*  CREATININE 1.30* 1.40* 1.03 1.25* 1.23 1.32*    Estimated Creatinine Clearance: 43.3 mL/min (A) (by C-G formula based on SCr of 1.32 mg/dL (H)).    Allergies  Allergen Reactions   Gabapentin Other (See Comments)    Severe confusion   Prozac [Fluoxetine Hcl] Other (See Comments)    Patient state that it does not agree with him   Soma [Carisoprodol] Other (See Comments)    Patient stated that it does not agree with him   Beta Adrenergic Blockers Other (See Comments)    REACTION: Bradycardia    Antimicrobials this admission: Unasyn 3/16 >>   Microbiology results: none   Thank you for allowing pharmacy to be a part of this patient's care.  Burnett Kanaris 01/08/2024 8:25 AM

## 2024-01-08 NOTE — Progress Notes (Addendum)
 [facility] ROUNDING NOTE John [name] ZOX:[id number]  DOB: 1935/02/20  DOA: 01/05/2024  PCP: [name], MD  01/08/2024,7:30 AM  LOS: 3 days    Code Status: DNR   from: [facility] current Dispo: Unclear   89 year old male CABG 1996 Paroxysmal atrial fibrillation not on anticoagulation-orthostasis and increased falls--has POTS syndrome as well and previously was on Florinef HFpEF DM TY 2 Generalized anxiety disorder HTN CKD 3 AA hospitalization 2/7-2/15 2024 with fall accident acute left femoral neck fracture status post operative repair 12/03/2022 Prior intracranial hemorrhage after fall 01/15/2021 Previous gastrectomy and vagotomy for peptic ulcer disease Previous TURP  3/13 present from facility with fall found down at facility right-sided weakness around 11 AM he was dysarthric Potassium 2.6 BUN/creatinine 14/1.3 LFTs normal troponin 26--hemoglobin 12.8 WBC 14.6 Chest x-ray cardiomegaly central venous congestion?  Infiltrate left base vague right midlung opacity CT angio showed spots sign Intracranial aneurysm or AVM-9 mm IPH?--3.8 X1.3 left basal ganglia hematoma MRI brain unchanged appearance hematoma Patient admitted to the ICU by neurology critical care followed--placed on Cleviprex gtt. additional   Plan  3.8 X1.3 left basal ganglia ICH Not a candidate for further anticoagulation at this time-previously has had falls with POTS syndrome and was on Florinef Seems to be choking and not able to keep down food-see below-n.p.o. for now trial sips with meds but if does not do well we will have to stop all p.o. meds  Possible aspiration pneumonia White count elevated chest x-ray on arrival showed infiltrate Repeat two-view x-ray today, start Unasyn and see if can be graduated by speech to alternate diet  Paroxysmal A-fib no anticoagulation CHADVASC >4 Previously not on rate control secondary to presumed orthostasis Not on anticoagulation because of falls and prior hip fracture  etc. See below--consider Watchman device or aspirin or trial with neurology/cardiology  CABG 1996 HFpEF Echo this admission EF 60-65% grade 1 DD with moderate AOV regurgitation No intracardiac source of embolism-defer to neurology if they want to look for further source  POTS syndrome, orthostatic hypotension Elevated blood pressure earlier Earlier in hospitalization was on Cleviprex gtt. in the setting of intracranial hemorrhage Previously on Florinef 0.1 twice daily midodrine 1 tablet 2 times a day for low BP 5 mg Will monitor trends and reassess  Moderately controlled DM TY 2 A1c 5.2 Would discontinue aggressive checks given advanced age Will continue to monitor-CBG all have been below 200 he is not eating much Ns 50 cc/h until SLP eval SLP to eval  Prostatism Needed In-N-Out cath on 3/15, now appears to be passing urine-observe    DVT prophylaxis: SCD  Status is: Inpatient Remains inpatient appropriate because:   Needs further workup   Subjective: Coherent awake able to tell me date time place person He is breathing a little bit quickly He has no specific discomfort does not feel significantly off his baseline Note prior cognitive eval however  Objective + exam Vitals:   01/07/24 1636 01/07/24 1949 01/07/24 2356 01/08/24 0350  BP: (!) 155/72 (!) 165/69 (!) 157/76 (!) 161/76  Pulse: 88 88 90 91  Resp: 16 18 18 18   Temp: 98.4 F (36.9 C) 98.1 F (36.7 C) 98.2 F (36.8 C) 99.8 F (37.7 C)  TempSrc: Oral Oral Oral Oral  SpO2: 95% 93% (!) 84% 91%  Weight:      Height:       Filed Weights   01/05/24 1631  Weight: 82.6 kg    Examination: EOMI-cannot assess vision by direct confrontation  as patient does not cooperate properly Finger-nose-finger is indicative of dysmetria on the right side left side is okay He has flattening of nasolabial fold on the right tongue protrudes midline Power is 5/5 Reflexes are brisk Lower extremity reflexes are brisk straight  leg raise intact S1-S2 slightly tachycardic NSR with PVCs His chest has diminished air entry on the left side but no wheeze rales rhonchi Psych euthymic  Data Reviewed: reviewed   CBC    Component Value Date/Time   WBC 13.5 (H) 01/08/2024 0630   RBC 3.65 (L) 01/08/2024 0630   HGB 12.5 (L) 01/08/2024 0630   HGB 13.4 12/15/2016 1705   HCT 37.0 (L) 01/08/2024 0630   HCT 39.2 12/15/2016 1705   PLT 210 01/08/2024 0630   PLT 206 12/15/2016 1705   MCV 101.4 (H) 01/08/2024 0630   MCV 92 12/15/2016 1705   MCH 34.2 (H) 01/08/2024 0630   MCHC 33.8 01/08/2024 0630   RDW 14.2 01/08/2024 0630   RDW 13.4 12/15/2016 1705   LYMPHSABS 1.5 01/05/2024 1247   LYMPHSABS 1.8 12/15/2016 1705   MONOABS 0.4 01/05/2024 1247   EOSABS 0.2 01/05/2024 1247   EOSABS 0.1 12/15/2016 1705   BASOSABS 0.0 01/05/2024 1247   BASOSABS 0.0 12/15/2016 1705      Latest Ref Rng & Units 01/07/2024    3:21 AM 01/06/2024    4:42 PM 01/06/2024    3:39 AM  CMP  Glucose 70 - 99 mg/dL 409  811  914   BUN 8 - 23 mg/dL 14  12  11    Creatinine 0.61 - 1.24 mg/dL 7.82  9.56  2.13   Sodium 135 - 145 mmol/L 142  142  140   Potassium 3.5 - 5.1 mmol/L 3.3  3.4  2.5   Chloride 98 - 111 mmol/L 106  105  103   CO2 22 - 32 mmol/L 24  29  28    Calcium 8.9 - 10.3 mg/dL 8.5  8.5  8.2     Scheduled Meds:  amLODipine  10 mg Oral Daily   atorvastatin  40 mg Oral Daily   Chlorhexidine Gluconate Cloth  6 each Topical Q0600   enoxaparin (LOVENOX) injection  40 mg Subcutaneous Q24H   escitalopram  10 mg Oral Daily   insulin aspart  0-5 Units Subcutaneous QHS   insulin aspart  0-9 Units Subcutaneous TID WC   metoprolol tartrate  25 mg Oral BID   pantoprazole  40 mg Oral QHS   senna-docusate  1 tablet Oral BID   tamsulosin  0.4 mg Oral QPC supper   Continuous Infusions:  Time  50  Rhetta Mura, MD  [facility]

## 2024-01-08 NOTE — Evaluation (Signed)
 Modified Barium Swallow Study  Patient Details  Name: John [name] MRN: [id number] Date of Birth: August 02, 1935  Today's Date: 01/08/2024  Modified Barium Swallow completed.  Full report located under Chart Review in the Imaging Section.  History of Present Illness SAYEED WEATHERALL is a 89 y.o. male with hx of A-fib not on anticoagulation, CAD, CKD stage II, DM, hyperlipidemia who presents with right-sided weakness that started abruptly.  He was in his normal state of health when he woke up this morning, but got up to go to the bathroom around 11 AM and when he did so, he suddenly developed right-sided weakness that caused him to fall.  He does not think that he hit his head.  He was found to have left basal ganglia intraparenchymal hematoma.  Patient initially passed the [facility] Screen on 01/05/2024 but began to have issues swallowing this date.   Clinical Impression Pt has an oropharyngeal dysphagia with reduced strength, sensation, and coordination. Lingual transit is repetitive and he has a collection of oral residue throughout his oral cavity and trailing down the base of his tongue. Pharyngeal deficits are more pronounced and include reduced base of tongue retraction, anterior hyoid excursion, laryngeal elevation, laryngeal vestibule closure, and pharyngeal squeeze. As a result he also has reduced epiglottic deflection and UES opening, although presence of cervical hardware may not help. Liquids spill into his laryngeal vestibule, touching his vocal cords, before a swallow is even elicited. He can clear some of the more shallow penetrates out, but he does not have sufficient protective mechanisms to fully clear his airway. He silently aspirates thin liquids before the swallow. Nectar and honey thick liquids sit on his vocal folds and cannot be cleared despite cues to cough. Penetration from purees is more transient, but he also has increasing residue throughout the pharynx as boluses  become thicker, with over half of the bolus remaining in his pharynx at times. He does not consistently follow commands to perform a second swallow, although this does help to reduce residue when he can. Heavy cueing is needed to try head turns and chin tucks with minimal change in function. He had the most benefit from a reclined posture, which allowed him increased epiglottic inversion and increased clearance through his UES. In this position, he consumed spoonfuls of honey thick liquids and purees with better airway protection and pharyngeal clearance.  Given his mentation and concern for PNA would remain NPO for today, but SLP will f/u  for potential to start some POs if appropriate strategies can be maintained.  DIGEST Swallow Severity Rating*  Safety: 2  Efficiency: 3  Overall Pharyngeal Swallow Severity: 3 1: mild; 2: moderate; 3: severe; 4: profound  *The Dynamic Imaging Grade of Swallowing Toxicity is standardized for the head and neck cancer population, however, demonstrates promising clinical applications across populations to standardize the clinical rating of pharyngeal swallow safety and severity.  Factors that may increase risk of adverse event in presence of aspiration Rubye Oaks & Clearance Coots 2021): Reduced cognitive function;Limited mobility;Frail or deconditioned;Dependence for feeding and/or oral hygiene;Weak cough  Swallow Evaluation Recommendations Recommendations: NPO (few pieces of ice with RN after oral care to provide moisture to pharynx') Medication Administration: Via alternative means Oral care recommendations: Oral care QID (4x/day) Caregiver Recommendations: Have oral suction available      Mahala Menghini., M.A. CCC-SLP Acute Rehabilitation Services Office [phone]  Secure chat preferred  01/08/2024,1:49 PM

## 2024-01-08 NOTE — Progress Notes (Signed)
 STROKE TEAM PROGRESS NOTE   INTERIM HISTORY/SUBJECTIVE Son in law is at the bedside. Pt initially sleeping but easily arousable and able to keep eyes open. Still has right facial droop but RUE seems improved some. However, he failed swallow today and now NPO, on IVF.   OBJECTIVE  CBC    Component Value Date/Time   WBC 13.5 (H) 01/08/2024 0630   RBC 3.65 (L) 01/08/2024 0630   HGB 12.5 (L) 01/08/2024 0630   HGB 13.4 12/15/2016 1705   HCT 37.0 (L) 01/08/2024 0630   HCT 39.2 12/15/2016 1705   PLT 210 01/08/2024 0630   PLT 206 12/15/2016 1705   MCV 101.4 (H) 01/08/2024 0630   MCV 92 12/15/2016 1705   MCH 34.2 (H) 01/08/2024 0630   MCHC 33.8 01/08/2024 0630   RDW 14.2 01/08/2024 0630   RDW 13.4 12/15/2016 1705   LYMPHSABS 1.5 01/05/2024 1247   LYMPHSABS 1.8 12/15/2016 1705   MONOABS 0.4 01/05/2024 1247   EOSABS 0.2 01/05/2024 1247   EOSABS 0.1 12/15/2016 1705   BASOSABS 0.0 01/05/2024 1247   BASOSABS 0.0 12/15/2016 1705    BMET    Component Value Date/Time   NA 144 01/08/2024 0630   NA 144 12/15/2016 1705   K 3.2 (L) 01/08/2024 0630   CL 108 01/08/2024 0630   CO2 24 01/08/2024 0630   GLUCOSE 130 (H) 01/08/2024 0630   BUN 19 01/08/2024 0630   BUN 21 12/15/2016 1705   CREATININE 1.32 (H) 01/08/2024 0630   CREATININE 1.21 12/30/2022 1623   CALCIUM 8.8 (L) 01/08/2024 0630   EGFR 58 (L) 12/30/2022 1623   GFRNONAA 52 (L) 01/08/2024 0630   GFRNONAA 52 (L) 07/24/2020 1233    IMAGING past 24 hours DG Swallowing Func-Speech Pathology Result Date: 01/08/2024 Table formatting from the original result was not included. Modified Barium Swallow Study Patient Details Name: John [name] MRN: [id number] Date of Birth: Feb 25, 1935 Today's Date: 01/08/2024 HPI/PMH: HPI: John [name] is a 89 y.o. male with hx of A-fib not on anticoagulation, CAD, CKD stage II, DM, hyperlipidemia who presents with right-sided weakness that started abruptly.  He was in his normal state of health when  he woke up this morning, but got up to go to the bathroom around 11 AM and when he did so, he suddenly developed right-sided weakness that caused him to fall.  He does not think that he hit his head.  He was found to have left basal ganglia intraparenchymal hematoma.  Patient initially passed the [facility] Screen on 01/05/2024 but began to have issues swallowing this date. Clinical Impression: Clinical Impression: Pt has an oropharyngeal dysphagia with reduced strength, sensation, and coordination. Lingual transit is repetitive and he has a collection of oral residue throughout his oral cavity and trailing down the base of his tongue. Pharyngeal deficits are more pronounced and include reduced base of tongue retraction, anterior hyoid excursion, laryngeal elevation, laryngeal vestibule closure, and pharyngeal squeeze. As a result he also has reduced epiglottic deflection and UES opening, although presence of cervical hardware may not help. Liquids spill into his laryngeal vestibule, touching his vocal cords, before a swallow is even elicited. He can clear some of the more shallow penetrates out, but he does not have sufficient protective mechanisms to fully clear his airway. He silently aspirates thin liquids before the swallow. Nectar and honey thick liquids sit on his vocal folds and cannot be cleared despite cues to cough. Penetration from purees is more transient, but he  also has increasing residue throughout the pharynx as boluses become thicker, with over half of the bolus remaining in his pharynx at times. He does not consistently follow commands to perform a second swallow, although this does help to reduce residue when he can. Heavy cueing is needed to try head turns and chin tucks with minimal change in function. He had the most benefit from a reclined posture, which allowed him increased epiglottic inversion and increased clearance through his UES. In this position, he consumed spoonfuls of honey thick  liquids and purees with better airway protection and pharyngeal clearance.  Given his mentation and concern for PNA would remain NPO for today, but SLP will f/u  for potential to start some POs if appropriate strategies can be maintained. DIGEST Swallow Severity Rating*  Safety: 2  Efficiency: 3  Overall Pharyngeal Swallow Severity: 3 1: mild; 2: moderate; 3: severe; 4: profound *The Dynamic Imaging Grade of Swallowing Toxicity is standardized for the head and neck cancer population, however, demonstrates promising clinical applications across populations to standardize the clinical rating of pharyngeal swallow safety and severity. Factors that may increase risk of adverse event in presence of aspiration John [name] & Clearance Coots 2021): Factors that may increase risk of adverse event in presence of aspiration John [name] & Clearance Coots 2021): Reduced cognitive function; Limited mobility; Frail or deconditioned; Dependence for feeding and/or oral hygiene; Weak cough Recommendations/Plan: Swallowing Evaluation Recommendations Swallowing Evaluation Recommendations Recommendations: NPO (few pieces of ice with RN after oral care to provide moisture to pharynx') Medication Administration: Via alternative means Oral care recommendations: Oral care QID (4x/day) Caregiver Recommendations: Have oral suction available Treatment Plan Treatment Plan Treatment recommendations: Therapy as outlined in treatment plan below Follow-up recommendations: Other (comment) (SLP f/u at ALF; SNF if ALF cannot accommodate) Functional status assessment: Patient has had a recent decline in their functional status and demonstrates the ability to make significant improvements in function in a reasonable and predictable amount of time. Treatment frequency: Min 2x/week Treatment duration: 2 weeks Interventions: Aspiration precaution training; Compensatory techniques; Patient/family education; Trials of upgraded texture/liquids; Diet toleration management by SLP;  Respiratory muscle strength training Recommendations Recommendations for follow up therapy are one component of a multi-disciplinary discharge planning process, led by the attending physician.  Recommendations may be updated based on patient status, additional functional criteria and insurance authorization. Assessment: Orofacial Exam: No data recorded Anatomy: Anatomy: Presence of cervical hardware Boluses Administered: Boluses Administered Boluses Administered: Thin liquids (Level 0); Mildly thick liquids (Level 2, nectar thick); Moderately thick liquids (Level 3, honey thick); Puree  Oral Impairment Domain: Oral Impairment Domain Lip Closure: No labial escape Tongue control during bolus hold: Escape to lateral buccal cavity/floor of mouth Bolus preparation/mastication: -- (deferred) Bolus transport/lingual motion: Repetitive/disorganized tongue motion Oral residue: Residue collection on oral structures Location of oral residue : Tongue; Palate; Floor of mouth Initiation of pharyngeal swallow : Pyriform sinuses  Pharyngeal Impairment Domain: Pharyngeal Impairment Domain Soft palate elevation: No bolus between soft palate (SP)/pharyngeal wall (PW) Laryngeal elevation: Minimal superior movement of thyroid cartilage with minimal approximation of arytenoids to epiglottic petiole Anterior hyoid excursion: Partial anterior movement Epiglottic movement: Partial inversion Laryngeal vestibule closure: Incomplete, narrow column air/contrast in laryngeal vestibule Pharyngeal stripping wave : Present - diminished Pharyngeal contraction (A/P view only): N/A Pharyngoesophageal segment opening: Partial distention/partial duration, partial obstruction of flow Tongue base retraction: Narrow column of contrast or air between tongue base and PPW Pharyngeal residue: Majority of contrast within or on pharyngeal structures Location of pharyngeal residue:  Valleculae; Pyriform sinuses; Pharyngeal wall  Esophageal Impairment Domain: No  data recorded Pill: Pill Consistency administered: -- (deferred) Penetration/Aspiration Scale Score: Penetration/Aspiration Scale Score 2.  Material enters airway, remains ABOVE vocal cords then ejected out: Puree 5.  Material enters airway, CONTACTS cords and not ejected out: Mildly thick liquids (Level 2, nectar thick); Moderately thick liquids (Level 3, honey thick) 8.  Material enters airway, passes BELOW cords without attempt by patient to eject out (silent aspiration) : Thin liquids (Level 0) Compensatory Strategies: Compensatory Strategies Compensatory strategies: Yes Straw: Ineffective Ineffective Straw: Thin liquid (Level 0); Mildly thick liquid (Level 2, nectar thick) Multiple swallows: Effective (but inconsistently elicited) Effective Multiple Swallows: Moderately thick liquid (Level 3, honey thick); Puree Chin tuck: Ineffective Ineffective Chin Tuck: Moderately thick liquid (Level 3, honey thick) Left head turn: Ineffective Ineffective Left Head Turn: Moderately thick liquid (Level 3, honey thick) Right head turn: Ineffective Ineffective Right Head Turn: Moderately thick liquid (Level 3, honey thick) Reclining posture: Effective Effective Reclining Posture: Moderately thick liquid (Level 3, honey thick); Puree (spoonfuls of honey)   General Information: Caregiver present: No  Diet Prior to this Study: NPO   Temperature : Normal   Respiratory Status: WFL   Supplemental O2: Nasal cannula   History of Recent Intubation: No  Behavior/Cognition: Alert; Cooperative; Requires cueing Self-Feeding Abilities: Needs assist with self-feeding Baseline vocal quality/speech: Hypophonia/low volume Volitional Cough: Able to elicit Volitional Swallow: Able to elicit (inconsistently) Exam Limitations: No limitations Goal Planning: Prognosis for improved oropharyngeal function: Good Barriers to Reach Goals: Cognitive deficits No data recorded Patient/Family Stated Goal: None stated Consulted and agree with results and  recommendations: Patient; Nurse Pain: Pain Assessment Pain Assessment: Faces Faces Pain Scale: 0 End of Session: Start Time:SLP Start Time (ACUTE ONLY): 1147 Stop Time: SLP Stop Time (ACUTE ONLY): 1208 Time Calculation:SLP Time Calculation (min) (ACUTE ONLY): 21 min Charges: SLP Evaluations $ SLP Speech Visit: 1 Visit SLP Evaluations $BSS Swallow: 1 Procedure $MBS Swallow: 1 Procedure SLP visit diagnosis: SLP Visit Diagnosis: Dysphagia, oropharyngeal phase (R13.12) Past Medical History: Past Medical History: Diagnosis Date  Anxiety   Arthritis   "lower back; left ankle" (03/20/2014)  ASTHMA   Blood transfusion   "related to OHS, ulcers"  CAD (coronary artery disease)   a. s/p 2V CABG 1996. b. Last cath 12/2016 -> patent grafts, med rx.  CAD, ARTERY BYPASS GRAFT   Cataract   CKD (chronic kidney disease), stage II   Colon polyps   adenomatous polyps  COPD   Depression   Diabetes mellitus without complication (HCC)   no meds now  Diverticulosis   Duodenal ulcer without hemorrhage or perforation 12/29/2011  GASTROESOPHAGEAL REFLUX DISEASE   HYPERLIPIDEMIA   Insomnia   Leukodystrophy (HCC)   Memory loss   Nephrolithiasis   "I've had over 320 kidney stones" (03/20/2014)  NEPHROLITHIASIS, HX OF 03/12/2009  Parkinsonism (HCC)   Sleep apnea   does not use CPAP  Stroke (HCC)   TIA  SUPRAVENTRICULAR TACHYCARDIA, HX OF   SVT (supraventricular tachycardia) (HCC) 03/12/2009  TIA   Ulcer  Past Surgical History: Past Surgical History: Procedure Laterality Date  ANKLE FRACTURE SURGERY Left ~ 2012  CARDIAC CATHETERIZATION    COLONOSCOPY    CORONARY ARTERY BYPASS GRAFT  1996  CABG X2  CYSTOSCOPY WITH URETEROSCOPY, STONE BASKETRY AND STENT PLACEMENT    Cystourethroscopy with stent removal.    EXCISIONAL HEMORRHOIDECTOMY    GASTRECTOMY    HIATAL HERNIA REPAIR    IRRIGATION AND DEBRIDEMENT  SEBACEOUS CYST    "off my back  KNEE ARTHROSCOPY WITH MEDIAL MENISECTOMY Left 08/24/2019  Procedure: LEFT KNEE ARTHROSCOPY WITH PARTIAL MEDIAL MENISCECTOMY;   Surgeon: Tarry Kos, MD;  Location: [facility];  Service: Orthopedics;  Laterality: Left;  LEFT HEART CATH AND CORS/GRAFTS ANGIOGRAPHY N/A 12/23/2016  Procedure: Left Heart Cath and Cors/Grafts Angiography;  Surgeon: Kathleene Hazel, MD;  Location[facility] INVASIVE CV LAB;  Service: Cardiovascular;  Laterality: N/A;  POLYPECTOMY    PROSTATE SURGERY    "took the center of my prostate out"  TOTAL HIP ARTHROPLASTY Left 12/03/2022  Procedure: LEFT TOTAL HIP ARTHROPLASTY ANTERIOR APPROACH;  Surgeon: Tarry Kos, MD;  Location: [facility] OR;  Service: Orthopedics;  Laterality: Left;  UPPER GASTROINTESTINAL ENDOSCOPY    VAGOTOMY   Mahala Menghini., M.A. CCC-SLP Acute Rehabilitation Services Office [phone] Secure chat preferred 01/08/2024, 1:51 PM  DG Chest 2 View Result Date: 01/08/2024 CLINICAL DATA:  Pneumonia.  Follow-up study. EXAM: CHEST - 2 VIEW COMPARISON:  01/05/2024 and older exams. FINDINGS: Stable changes from prior cardiac surgery. Cardiac silhouette borderline enlarged. No mediastinal or hilar masses. Mild interstitial prominence, most evident in the lower lungs. Mild hazy opacity lateral to the right hilum and in the lung bases, greater on the left with there is also more confluent opacity. Probable small left effusion. No overt pulmonary edema. No pneumothorax. Overall, no convincing change from the most recent prior study. IMPRESSION: 1. No significant change from the most recent prior study. 2. Lung opacities most evident at the left lung base suspected to be atelectasis. A component of pneumonia is not excluded, however. Probable small left effusion. No overt pulmonary edema. Electronically Signed   By: Amie Portland M.D.   On: 01/08/2024 10:52    Vitals:   01/08/24 0350 01/08/24 0847 01/08/24 1232 01/08/24 1345  BP: (!) 161/76 (!) 163/72 (!) 169/73 (!) 143/61  Pulse: 91 96 96   Resp: 18 16 17 18   Temp: 99.8 F (37.7 C) 99.2 F (37.3 C) 98.9 F (37.2 C)   TempSrc: Oral Oral Oral    SpO2: 91% 92% 100% 91%  Weight:      Height:         PHYSICAL EXAM General:  Alert, well-nourished, well-developed elderly Caucasian male in no acute distress CV: Rhythm regular, not in afib  Respiratory:  Regular, unlabored respirations on room air GI: Abdomen soft and nontender  Neuro - lethargic and sleepy but easily arousable and able to keep eyes open, orientated to age, place, time but with moderate dysarthria. No aphasia but paraphasic errors with pressured speech and became word salad with long sentences. Able to name 3/4 but with mild perseveration. Able to repeat short sentence in dysarthric voice but with longer sentence he became word salad. No gaze palsy, tracking bilaterally, visual field full, PERRL. Right facial droop. Tongue protrusion to the right. LUE 4/5 proximal and 5/5 distally. RUE 4/5 with pronator drift but distally 5/5. BLEs proximal 4/5 and 5/5 distally. Sensation symmetrical bilaterally subjectively, b/l FTN intact but slow, gait not tested.     ASSESSMENT/PLAN  Mr. TRAVELL DESAULNIERS is a 89 y.o. male with history of A-fib not on anticoagulation, CAD, CKD stage II, diabetes and hyperlipidemia admitted for acute onset right-sided weakness resulting in a fall.  Patient was found to have a small ICH in the left basal ganglia on CT.  He reports that his symptoms have improved somewhat overnight. NIH on Admission 8  ICH:  left  BG ICH, etiology likely hypertensive with small vessel disease Code Stroke CT head 9 mm acute IPH centered within left basal ganglia with surrounding edema, chronic encephalomalacia in right superior frontal gyrus at site of old IPH, background atrophy and chronic small vessel ischemic disease CTA head & neck no LVO, AVM, spot sign or aneurysm, generalized arterial tortuosity MRI with and without contrast stable left BG ICH 2D Echo EF 60 to 65%, moderately dilated left atrium  LDL 121 HgbA1c 5.2 VTE prophylaxis -lovenox No antithrombotic  prior to admission, now on No antithrombotic secondary to ICH Therapy recommendations: SNF Disposition: Pending  Hx of PAF Home Meds: None Per daughter, pt probably had one brief PAF while he had hip surgery in the hospital but no further issue after that Currently in sinus rhythm He follows with Dr. Clifton James as outpt No AC at this time due to acute ICH May consider watchman device or ASPIRE trial, follow up with neurology and cardiology as outpt.   Hypertension Home meds: Amlodipine 5 mg daily Off Cleviprex now In norvasc 10 and metoprolol 25 bid BP goal < 160 Long term BP goal normotensive  Hyperlipidemia Home meds: None LDL 121, goal < 70 On lipitor 40 Continue statin on discharge  Diabetes type II Controlled Home meds: None HgbA1c 5.2, goal < 7.0 CBGs SSI Recommend close follow-up with PCP for better DM control  Dysphagia  Did not pass swallow today Now NPO On IVF Speech on board  Other Stroke Risk Factors Coronary artery disease Advanced age  Other Active Problems AKI on CKD 3a, Cre 1.25--1.23--1.32  Hospital day # 3    Marvel Plan, MD PhD Stroke Neurology 01/08/2024 2:34 PM    To contact Stroke Continuity provider, please refer to [email or web address]. After hours, contact General Neurology

## 2024-01-08 NOTE — Evaluation (Signed)
 Clinical/Bedside Swallow Evaluation Patient Details  Name: John [name] MRN: [id number] Date of Birth: 1935/08/13  Today's Date: 01/08/2024 Time: SLP Start Time (ACUTE ONLY): 0920 SLP Stop Time (ACUTE ONLY): 0930 SLP Time Calculation (min) (ACUTE ONLY): 10 min  Past Medical History:  Past Medical History:  Diagnosis Date   Anxiety    Arthritis    "lower back; left ankle" (03/20/2014)   ASTHMA    Blood transfusion    "related to OHS, ulcers"   CAD (coronary artery disease)    a. s/p 2V CABG 1996. b. Last cath 12/2016 -> patent grafts, med rx.   CAD, ARTERY BYPASS GRAFT    Cataract    CKD (chronic kidney disease), stage II    Colon polyps    adenomatous polyps   COPD    Depression    Diabetes mellitus without complication (HCC)    no meds now   Diverticulosis    Duodenal ulcer without hemorrhage or perforation 12/29/2011   GASTROESOPHAGEAL REFLUX DISEASE    HYPERLIPIDEMIA    Insomnia    Leukodystrophy (HCC)    Memory loss    Nephrolithiasis    "I've had over 320 kidney stones" (03/20/2014)   NEPHROLITHIASIS, HX OF 03/12/2009   Parkinsonism (HCC)    Sleep apnea    does not use CPAP   Stroke (HCC)    TIA   SUPRAVENTRICULAR TACHYCARDIA, HX OF    SVT (supraventricular tachycardia) (HCC) 03/12/2009   TIA    Ulcer    Past Surgical History:  Past Surgical History:  Procedure Laterality Date   ANKLE FRACTURE SURGERY Left ~ 2012   CARDIAC CATHETERIZATION     COLONOSCOPY     CORONARY ARTERY BYPASS GRAFT  1996   CABG X2   CYSTOSCOPY WITH URETEROSCOPY, STONE BASKETRY AND STENT PLACEMENT     Cystourethroscopy with stent removal.     EXCISIONAL HEMORRHOIDECTOMY     GASTRECTOMY     HIATAL HERNIA REPAIR     IRRIGATION AND DEBRIDEMENT SEBACEOUS CYST     "off my back   KNEE ARTHROSCOPY WITH MEDIAL MENISECTOMY Left 08/24/2019   Procedure: LEFT KNEE ARTHROSCOPY WITH PARTIAL MEDIAL MENISCECTOMY;  Surgeon: Tarry Kos, MD;  Location: [facility];  Service:  Orthopedics;  Laterality: Left;   LEFT HEART CATH AND CORS/GRAFTS ANGIOGRAPHY N/A 12/23/2016   Procedure: Left Heart Cath and Cors/Grafts Angiography;  Surgeon: Kathleene Hazel, MD;  Location: [facility] INVASIVE CV LAB;  Service: Cardiovascular;  Laterality: N/A;   POLYPECTOMY     PROSTATE SURGERY     "took the center of my prostate out"   TOTAL HIP ARTHROPLASTY Left 12/03/2022   Procedure: LEFT TOTAL HIP ARTHROPLASTY ANTERIOR APPROACH;  Surgeon: Tarry Kos, MD;  Location: [facility] OR;  Service: Orthopedics;  Laterality: Left;   UPPER GASTROINTESTINAL ENDOSCOPY     VAGOTOMY     HPI:  John [name] is a 89 y.o. male with hx of A-fib not on anticoagulation, CAD, CKD stage II, DM, hyperlipidemia who presents with right-sided weakness that started abruptly.  He was in his normal state of health when he woke up this morning, but got up to go to the bathroom around 11 AM and when he did so, he suddenly developed right-sided weakness that caused him to fall.  He does not think that he hit his head.  He was found to have left basal ganglia intraparenchymal hematoma.  Patient initially passed the [facility] Screen on 01/05/2024 but began to  have issues swallowing this date.    Assessment / Plan / Recommendation  Clinical Impression  Clinical swallowing evaluation was completed using ice chips, thin liquids via spoon, cup and straw and pureed material.    RN approved patient for PO intake and reported he got choked while eating breatfast this AM and that he was pocketing foods.  Cranial nerve exam was completed and remarkable for facial droop on the right as well as lingual deviation to the right with movement and lingual weakness on the right.  Jaw and lip strength appeared to be adequate.  Jaw range of motion was good.  Facial sensation was intact.  He presented with a possible oral and pharyngeal dysphagia.  A/P transport appeared to be slow.  Swallow trigger was appreciated to palpation.  Patient with cough  on ice chips and thin liquids.  Wet vocal quality noted after pureed material.  Given clinical presentation recommend patient remain NPO pending MBS to fully assesss swallowing physiology and safety. SLP Visit Diagnosis: Dysphagia, unspecified (R13.10)    Aspiration Risk  Moderate aspiration risk    Diet Recommendation   NPO  Medication Administration: Via alternative means    Other  Recommendations Oral Care Recommendations: Oral care BID    Recommendations for follow up therapy are one component of a multi-disciplinary discharge planning process, led by the attending physician.  Recommendations may be updated based on patient status, additional functional criteria and insurance authorization.  Follow up Recommendations Skilled nursing-short term rehab (<3 hours/day)         Functional Status Assessment Patient has had a recent decline in their functional status and demonstrates the ability to make significant improvements in function in a reasonable and predictable amount of time.         Prognosis Prognosis for improved oropharyngeal function: Good      Swallow Study   General Date of Onset: 01/05/24 HPI: John [name] is a 89 y.o. male with hx of A-fib not on anticoagulation, CAD, CKD stage II, DM, hyperlipidemia who presents with right-sided weakness that started abruptly.  He was in his normal state of health when he woke up this morning, but got up to go to the bathroom around 11 AM and when he did so, he suddenly developed right-sided weakness that caused him to fall.  He does not think that he hit his head.  He was found to have left basal ganglia intraparenchymal hematoma.  Patient initially passed the [facility] Screen on 01/05/2024 but began to have issues swallowing this date. Type of Study: Bedside Swallow Evaluation Previous Swallow Assessment: None noted at [facility] Diet Prior to this Study: NPO Temperature Spikes Noted: No Respiratory Status: Room air History of  Recent Intubation: No Behavior/Cognition: Alert;Cooperative;Pleasant mood Oral Cavity Assessment: Within Functional Limits Oral Care Completed by SLP: No Vision: Functional for self-feeding Self-Feeding Abilities: Needs assist Patient Positioning: Upright in bed Baseline Vocal Quality: Normal Volitional Swallow: Unable to elicit    Oral/Motor/Sensory Function Overall Oral Motor/Sensory Function: Moderate impairment Facial ROM: Reduced right;Suspected CN VII (facial) dysfunction Facial Symmetry: Abnormal symmetry right;Suspected CN VII (facial) dysfunction Facial Strength: Reduced right;Suspected CN VII (facial) dysfunction Facial Sensation: Within Functional Limits Lingual ROM: Reduced right;Suspected CN XII (hypoglossal) dysfunction Lingual Symmetry: Abnormal symmetry right;Suspected CN XII (hypoglossal) dysfunction Lingual Strength: Reduced;Suspected CN XII (hypoglossal) dysfunction Mandible: Within Functional Limits   Ice Chips Ice chips: Impaired Presentation: Spoon Pharyngeal Phase Impairments: Throat Clearing - Immediate   Thin Liquid Thin Liquid: Impaired Presentation:  Straw;Spoon;Cup Pharyngeal  Phase Impairments: Suspected delayed Swallow;Cough - Immediate;Cough - Delayed    Nectar Thick Nectar Thick Liquid: Not tested   Honey Thick Honey Thick Liquid: Not tested   Puree Puree: Impaired Presentation: Spoon Pharyngeal Phase Impairments: Suspected delayed Swallow;Wet Vocal Quality   Solid     Solid: Not tested     Dimas Aguas, MA, CCC-SLP Acute Rehab SLP [phone]  Fleet Contras 01/08/2024,9:47 AM

## 2024-01-09 DIAGNOSIS — I739 Peripheral vascular disease, unspecified: Secondary | ICD-10-CM | POA: Diagnosis not present

## 2024-01-09 DIAGNOSIS — E785 Hyperlipidemia, unspecified: Secondary | ICD-10-CM | POA: Diagnosis not present

## 2024-01-09 DIAGNOSIS — I610 Nontraumatic intracerebral hemorrhage in hemisphere, subcortical: Secondary | ICD-10-CM | POA: Diagnosis not present

## 2024-01-09 LAB — CBC
HCT: 37.4 % — ABNORMAL LOW (ref 39.0–52.0)
Hemoglobin: 12.4 g/dL — ABNORMAL LOW (ref 13.0–17.0)
MCH: 33.8 pg (ref 26.0–34.0)
MCHC: 33.2 g/dL (ref 30.0–36.0)
MCV: 101.9 fL — ABNORMAL HIGH (ref 80.0–100.0)
Platelets: 193 10*3/uL (ref 150–400)
RBC: 3.67 MIL/uL — ABNORMAL LOW (ref 4.22–5.81)
RDW: 14.2 % (ref 11.5–15.5)
WBC: 11.1 10*3/uL — ABNORMAL HIGH (ref 4.0–10.5)
nRBC: 0 % (ref 0.0–0.2)

## 2024-01-09 LAB — GLUCOSE, CAPILLARY
Glucose-Capillary: 128 mg/dL — ABNORMAL HIGH (ref 70–99)
Glucose-Capillary: 126 mg/dL — ABNORMAL HIGH (ref 70–99)
Glucose-Capillary: 124 mg/dL — ABNORMAL HIGH (ref 70–99)
Glucose-Capillary: 121 mg/dL — ABNORMAL HIGH (ref 70–99)

## 2024-01-09 LAB — BASIC METABOLIC PANEL
Anion gap: 11 (ref 5–15)
BUN: 17 mg/dL (ref 8–23)
CO2: 24 mmol/L (ref 22–32)
Calcium: 8.3 mg/dL — ABNORMAL LOW (ref 8.9–10.3)
Chloride: 108 mmol/L (ref 98–111)
Creatinine, Ser: 1.37 mg/dL — ABNORMAL HIGH (ref 0.61–1.24)
GFR, Estimated: 50 mL/min — ABNORMAL LOW (ref >60)
Glucose, Bld: 127 mg/dL — ABNORMAL HIGH (ref 70–99)
Potassium: 3.1 mmol/L — ABNORMAL LOW (ref 3.5–5.1)
Sodium: 143 mmol/L (ref 135–145)

## 2024-01-09 LAB — MAGNESIUM: Magnesium: 2 mg/dL (ref 1.7–2.4)

## 2024-01-09 MED ORDER — AMLODIPINE BESYLATE 5 MG PO TABS
5.0000 mg | ORAL_TABLET | Freq: Two times a day (BID) | ORAL | Status: DC | PRN
  Filled 2024-01-09: qty 1

## 2024-01-09 MED ORDER — POTASSIUM CHLORIDE 10 MEQ/100ML IV SOLN
10.0000 meq | INTRAVENOUS | Status: AC
  Administered 2024-01-09 (×4): 10 meq via INTRAVENOUS
  Filled 2024-01-09 (×4): qty 100

## 2024-01-09 MED ORDER — CLONIDINE HCL 0.1 MG/24HR TD PTWK
0.1000 mg | MEDICATED_PATCH | TRANSDERMAL | Status: DC
  Administered 2024-01-09: 0.1 mg via TRANSDERMAL
  Filled 2024-01-09: qty 1

## 2024-01-09 NOTE — Progress Notes (Signed)
 SBps in 180s, gave his PRN Labetalol, still BP in 170s, still in NPO, MD from neurology at bedside, will continue to monitor

## 2024-01-09 NOTE — Progress Notes (Signed)
 TRH ROUNDING NOTE John [name] MVH:[id number]  DOB: [birth year]  DOA: 01/05/2024  PCP: [name], MD  01/09/2024,8:53 AM  LOS: 4 days    Code Status: DNR   from: [facility] current Dispo: Unclear   89 year old male CABG 1996 Paroxysmal atrial fibrillation not on anticoagulation-orthostasis and increased falls--has POTS syndrome as well and previously was on Florinef HFpEF DM TY 2 Generalized anxiety disorder HTN CKD 3 AA hospitalization 2/7-2/15 2024 with fall accident acute left femoral neck fracture status post operative repair 12/03/2022 Prior intracranial hemorrhage after fall 01/15/2021 Previous gastrectomy and vagotomy for peptic ulcer disease Previous TURP  3/13 present from facility with fall found down at facility right-sided weakness around 11 AM he was dysarthric Potassium 2.6 BUN/creatinine 14/1.3 LFTs normal troponin 26--hemoglobin 12.8 WBC 14.6 Chest x-ray cardiomegaly central venous congestion?  Infiltrate left base vague right midlung opacity CT angio showed spots sign Intracranial aneurysm or AVM-9 mm IPH?--3.8 X1.3 left basal ganglia hematoma MRI brain unchanged appearance hematoma Patient admitted to the ICU by neurology critical care followed--placed on Cleviprex gtt. additional   Plan  3.8 X1.3 left basal ganglia ICH--CT head shows only 9 mm however No anticoagulation at this time-previously has had falls with POTS syndrome and was on Florinef Downgraded to n.p.o. and formal speech eval is pending-only give specific mandatory meds by mouth with sip of water White count is coming down-monitor for further issues As is n.p.o. continue fluids  Possible aspiration pneumonia White count elevated chest x-ray on arrival showed infiltrate-felt to be more atelectasis by formal report but is currently on Unasyn-would treat for presumed aspiration for 5 days Await formal speech eval Wean oxygen as able  Paroxysmal A-fib no anticoagulation CHADVASC  >4 Previously not on rate control secondary to presumed orthostasis Not on anticoagulation because of falls and prior hip fracture etc. See below--consider Watchman device or aspirin or trial with neurology/cardiology  CABG 1996 HFpEF Echo this admission EF 60-65% grade 1 DD with moderate AoV regurgitation No intracardiac source of embolism-defer to neurology if they want to look for further source  POTS syndrome, orthostatic hypotension Elevated blood pressure earlier Earlier in hospitalization was on Cleviprex gtt. in the setting of intracranial hemorrhage Previously on Florinef 0.1 twice daily midodrine 1 tablet 2 times a day for low BP 5 mg Will monitor trends and reassess-he has been taking off all meds to boost his blood pressure as he is actually hypertensive now-he may need repeat orthostatics later on this hospital stay  Mild hypokalemia Replace with KCl for menance Check magnesium and replace as needed  Moderately controlled DM TY 2 A1c 5.2 Would discontinue aggressive checks given advanced age Ns 59 cc/h until SLP eval  Prostatism Needed In-N-Out cath on 3/15, now appears to be passing urine-observe   Discussed with patient's son-in-law at the bedside on 3/16-no family available today  DVT prophylaxis: SCD  Status is: Inpatient Remains inpatient appropriate because:   Needs further workup   Subjective:  Looks awake seems pleasant no distress Some cough No wheeze rales rhonchi He is confused about the year and the president but corrects himself His speech is still somewhat unintelligible  Objective + exam Vitals:   01/09/24 0400 01/09/24 0753 01/09/24 0804 01/09/24 0821  BP: (!) 143/60 (!) 166/84 (!) 175/79 (!) 160/80  Pulse: 91 (!) 101    Resp: 18 18    Temp: 98.6 F (37 C) 99.3 F (37.4 C)    TempSrc: Axillary Oral    SpO2:  93% 95%    Weight:      Height:       Filed Weights   01/05/24 1631  Weight: 82.6 kg    Examination:  Coherent white  male no distress-on oxygen Facial droop on right side Speech is not clear Power is 5/5 bilaterally Chest is clear posterolaterally Abdomen soft no rebound no guarding Power is 5/5 moves 4 limbs equally able to raise arms and legs  Data Reviewed: reviewed   CBC    Component Value Date/Time   WBC 11.1 (H) 01/09/2024 0503   RBC 3.67 (L) 01/09/2024 0503   HGB 12.4 (L) 01/09/2024 0503   HGB 13.4 12/15/2016 1705   HCT 37.4 (L) 01/09/2024 0503   HCT 39.2 12/15/2016 1705   PLT 193 01/09/2024 0503   PLT 206 12/15/2016 1705   MCV 101.9 (H) 01/09/2024 0503   MCV 92 12/15/2016 1705   MCH 33.8 01/09/2024 0503   MCHC 33.2 01/09/2024 0503   RDW 14.2 01/09/2024 0503   RDW 13.4 12/15/2016 1705   LYMPHSABS 1.5 01/05/2024 1247   LYMPHSABS 1.8 12/15/2016 1705   MONOABS 0.4 01/05/2024 1247   EOSABS 0.2 01/05/2024 1247   EOSABS 0.1 12/15/2016 1705   BASOSABS 0.0 01/05/2024 1247   BASOSABS 0.0 12/15/2016 1705      Latest Ref Rng & Units 01/09/2024    5:03 AM 01/08/2024    6:30 AM 01/07/2024    3:21 AM  CMP  Glucose 70 - 99 mg/dL 478  295  621   BUN 8 - 23 mg/dL 17  19  14    Creatinine 0.61 - 1.24 mg/dL 3.08  6.57  8.46   Sodium 135 - 145 mmol/L 143  144  142   Potassium 3.5 - 5.1 mmol/L 3.1  3.2  3.3   Chloride 98 - 111 mmol/L 108  108  106   CO2 22 - 32 mmol/L 24  24  24    Calcium 8.9 - 10.3 mg/dL 8.3  8.8  8.5     Scheduled Meds:  enoxaparin (LOVENOX) injection  40 mg Subcutaneous Q24H   insulin aspart  0-5 Units Subcutaneous QHS   insulin aspart  0-9 Units Subcutaneous TID WC   tamsulosin  0.4 mg Oral QPC supper   Continuous Infusions:  ampicillin-sulbactam (UNASYN) IV 3 g (01/09/24 0345)   dextrose 5 % and 0.9 % NaCl 50 mL/hr at 01/08/24 1530   potassium chloride 10 mEq (01/09/24 0857)    Time  50  Rhetta Mura, MD  [facility]

## 2024-01-09 NOTE — Plan of Care (Signed)
  Problem: Coping: Goal: Will verbalize positive feelings about self Outcome: Progressing Goal: Will identify appropriate support needs Outcome: Progressing   Problem: Health Behavior/Discharge Planning: Goal: Goals will be collaboratively established with patient/family Outcome: Progressing   Problem: Self-Care: Goal: Ability to communicate needs accurately will improve Outcome: Progressing   Problem: Clinical Measurements: Goal: Diagnostic test results will improve Outcome: Progressing Goal: Cardiovascular complication will be avoided Outcome: Progressing   Problem: Coping: Goal: Level of anxiety will decrease Outcome: Progressing   Problem: Elimination: Goal: Will not experience complications related to bowel motility Outcome: Progressing Goal: Will not experience complications related to urinary retention Outcome: Progressing   Problem: Pain Managment: Goal: General experience of comfort will improve and/or be controlled Outcome: Progressing   Problem: Safety: Goal: Ability to remain free from injury will improve Outcome: Progressing   Problem: Intracerebral Hemorrhage Tissue Perfusion: Goal: Complications of Intracerebral Hemorrhage will be minimized Outcome: Not Progressing   Problem: Health Behavior/Discharge Planning: Goal: Ability to manage health-related needs will improve Outcome: Not Progressing   Problem: Self-Care: Goal: Ability to participate in self-care as condition permits will improve Outcome: Not Progressing Goal: Verbalization of feelings and concerns over difficulty with self-care will improve Outcome: Not Progressing   Problem: Nutrition: Goal: Risk of aspiration will decrease Outcome: Not Progressing Goal: Dietary intake will improve Outcome: Not Progressing   Problem: Nutritional: Goal: Maintenance of adequate nutrition will improve Outcome: Not Progressing   Problem: Clinical Measurements: Goal: Will remain free from  infection Outcome: Not Progressing Goal: Respiratory complications will improve Outcome: Not Progressing   Problem: Nutrition: Goal: Adequate nutrition will be maintained Outcome: Not Progressing

## 2024-01-09 NOTE — Care Management Important Message (Signed)
 Important Message  Patient Details  Name: John [name] MRN: [id number] Date of Birth: 1935/02/21   Important Message Given:  Yes - Medicare IM     Dorena Bodo 01/09/2024, 2:20 PM

## 2024-01-09 NOTE — Progress Notes (Signed)
 Speech Language Pathology Treatment: Dysphagia  Patient Details Name: John [name] MRN: [id number] DOB: 1934/11/14 Today's Date: 01/09/2024 Time: 4098-1191 SLP Time Calculation (min) (ACUTE ONLY): 24 min  Assessment / Plan / Recommendation Clinical Impression  Pt is a little sleepier this afternoon compared to MBS on previous date, awake to take POs but quickly falling asleep once stimulus is stopped. Education was provided to pt and daughter, [name], about MBS results from previous date. Based on findings, pt was positioned into a semi-reclined position before offering spoonfuls of honey thick liquids and purees. Coughing is observed with honey thick liquids but not purees. He does not consistently use a second swallow to try to clear pharyngeal residue, and both his initial and second swallows appear to be slow in onset, requiring cues to trigger. [name] confirms that this looks different than his swallowing did prior to yesterday, and that his respiratory status does not look the same either. Suspect an acute neurological dysphagia, exacerbated by onset of acute illness as well.   Per discussion with attending MD and neurology, would keep NPO today but if there are essential medications that he needs, could offer them crushed in puree if strategies below are also being followed (especially the partially reclined position). If alert and requesting POs, could provide oral care before administering a few pieces of ice one at a time while also following outlined precautions. SLP will f/u for readiness to repeat MBS, maybe with some more time for antibiotics and improved respiratory status.   HPI HPI: John [name] is a 89 y.o. male with hx of A-fib not on anticoagulation, CAD, CKD stage II, DM, hyperlipidemia who presents with right-sided weakness that started abruptly.  He was in his normal state of health when he woke up this morning, but got up to go to the bathroom around 11 AM and when he did  so, he suddenly developed right-sided weakness that caused him to fall.  He does not think that he hit his head.  He was found to have left basal ganglia intraparenchymal hematoma.  Patient initially passed the [facility] Screen on 01/05/2024 but began to have issues swallowing this date.      SLP Plan  Continue with current plan of care      Recommendations for follow up therapy are one component of a multi-disciplinary discharge planning process, led by the attending physician.  Recommendations may be updated based on patient status, additional functional criteria and insurance authorization.    Recommendations  Diet recommendations: NPO;Other(comment) (few, single pieces of ice after oral care if alert) Medication Administration: Crushed with puree (essential meds crushed in puree with strategies outlined below) Supervision: Staff to assist with self feeding;Full supervision/cueing for compensatory strategies Compensations: Minimize environmental distractions;Slow rate;Small sips/bites;Multiple dry swallows after each bite/sip (swallow x2 per bolus) Postural Changes and/or Swallow Maneuvers:  (Partially reclined posture)                  Oral care QID;Oral care prior to ice chip/H20   Frequent or constant Supervision/Assistance Dysphagia, oropharyngeal phase (R13.12)     Continue with current plan of care     Mahala Menghini., M.A. CCC-SLP Acute Rehabilitation Services Office [phone]  Secure chat preferred   01/09/2024, 1:52 PM

## 2024-01-09 NOTE — Progress Notes (Signed)
 STROKE TEAM PROGRESS NOTE   INTERIM HISTORY/SUBJECTIVE Daughter is at the bedside. Pt eyes open, more awake alert than yesterday, however still did not pass swallow, currently n.p.o on IV fluid.   OBJECTIVE  CBC    Component Value Date/Time   WBC 11.1 (H) 01/09/2024 0503   RBC 3.67 (L) 01/09/2024 0503   HGB 12.4 (L) 01/09/2024 0503   HGB 13.4 12/15/2016 1705   HCT 37.4 (L) 01/09/2024 0503   HCT 39.2 12/15/2016 1705   PLT 193 01/09/2024 0503   PLT 206 12/15/2016 1705   MCV 101.9 (H) 01/09/2024 0503   MCV 92 12/15/2016 1705   MCH 33.8 01/09/2024 0503   MCHC 33.2 01/09/2024 0503   RDW 14.2 01/09/2024 0503   RDW 13.4 12/15/2016 1705   LYMPHSABS 1.5 01/05/2024 1247   LYMPHSABS 1.8 12/15/2016 1705   MONOABS 0.4 01/05/2024 1247   EOSABS 0.2 01/05/2024 1247   EOSABS 0.1 12/15/2016 1705   BASOSABS 0.0 01/05/2024 1247   BASOSABS 0.0 12/15/2016 1705    BMET    Component Value Date/Time   NA 143 01/09/2024 0503   NA 144 12/15/2016 1705   K 3.1 (L) 01/09/2024 0503   CL 108 01/09/2024 0503   CO2 24 01/09/2024 0503   GLUCOSE 127 (H) 01/09/2024 0503   BUN 17 01/09/2024 0503   BUN 21 12/15/2016 1705   CREATININE 1.37 (H) 01/09/2024 0503   CREATININE 1.21 12/30/2022 1623   CALCIUM 8.3 (L) 01/09/2024 0503   EGFR 58 (L) 12/30/2022 1623   GFRNONAA 50 (L) 01/09/2024 0503   GFRNONAA 52 (L) 07/24/2020 1233    IMAGING past 24 hours No results found.   Vitals:   01/09/24 1210 01/09/24 1412 01/09/24 1537 01/09/24 1600  BP: (!) 179/79 (!) 147/73 (!) 174/98 (!) 160/85  Pulse:  88 100 98  Resp:   18   Temp:   99.2 F (37.3 C)   TempSrc:   Oral   SpO2: 95%  95%   Weight:      Height:         PHYSICAL EXAM General:  Alert, well-nourished, well-developed elderly Caucasian male in no acute distress CV: Rhythm regular, not in afib  Respiratory:  Regular, unlabored respirations on room air GI: Abdomen soft and nontender  Neuro - awake alert eyes open, still lethargic,  orientated to age, place, time but with moderate dysarthria. No aphasia but paraphasic errors with pressured speech and became word salad with long sentences. Able to name 3/4 but with mild perseveration. Able to repeat short sentence in dysarthric voice but with longer sentence he became word salad. No gaze palsy, tracking bilaterally, visual field full, PERRL. Right facial droop. Tongue protrusion to the right. LUE 4/5 proximal and 5/5 distally. RUE 4/5 with pronator drift but distally 5/5. BLEs proximal 4/5 and 5/5 distally. Sensation symmetrical bilaterally subjectively, b/l FTN intact but slow, gait not tested.     ASSESSMENT/PLAN  [name] is a 89 y.o. male with history of A-fib not on anticoagulation, CAD, CKD stage II, diabetes and hyperlipidemia admitted for acute onset right-sided weakness resulting in a fall.  Patient was found to have a small ICH in the left basal ganglia on CT.  He reports that his symptoms have improved somewhat overnight. NIH on Admission 8  ICH:  left BG ICH, etiology likely hypertensive with small vessel disease Code Stroke CT head 9 mm acute IPH centered within left basal ganglia with surrounding edema, chronic encephalomalacia in right  superior frontal gyrus at site of old IPH, background atrophy and chronic small vessel ischemic disease CTA head & neck no LVO, AVM, spot sign or aneurysm, generalized arterial tortuosity MRI with and without contrast stable left BG ICH 2D Echo EF 60 to 65%, moderately dilated left atrium  LDL 121 HgbA1c 5.2 VTE prophylaxis -lovenox No antithrombotic prior to admission, now on No antithrombotic secondary to ICH Therapy recommendations: SNF Disposition: Pending  Hx of PAF Home Meds: None Per daughter, pt probably had one brief PAF while he had hip surgery in the hospital but no further issue after that Currently in sinus rhythm He follows with Dr. Clifton James as outpt No AC at this time due to acute ICH May  consider watchman device or ASPIRE trial, follow up with neurology and cardiology as outpt.   Hypertension Home meds: Amlodipine 5 mg daily Off Cleviprex now In norvasc 10 and metoprolol 25 bid - on hold due to n.p.o. Now on clonidine patch BP goal < 160 Long term BP goal normotensive  Hyperlipidemia Home meds: None LDL 121, goal < 70 On lipitor 40 Continue statin on discharge  Diabetes type II Controlled Home meds: None HgbA1c 5.2, goal < 7.0 CBGs SSI Recommend close follow-up with PCP for better DM control  Dysphagia  Did not pass swallow today again Now NPO Consider core track on Wednesday if still not passing swallow Speech on board  Other Stroke Risk Factors Coronary artery disease Advanced age  Other Active Problems AKI on CKD 3a, Cre 1.25--1.23--1.32--1.37 Hypokalemia, potassium 3.1, supplement  Hospital day # 4    Marvel Plan, MD PhD Stroke Neurology 01/09/2024 6:04 PM    To contact Stroke Continuity provider, please refer to [email or web address]. After hours, contact General Neurology

## 2024-01-09 NOTE — Plan of Care (Signed)
  Problem: Coping: Goal: Will identify appropriate support needs Outcome: Progressing   Problem: Coping: Goal: Ability to adjust to condition or change in health will improve Outcome: Progressing   Problem: Metabolic: Goal: Ability to maintain appropriate glucose levels will improve Outcome: Progressing   Problem: Skin Integrity: Goal: Risk for impaired skin integrity will decrease Outcome: Progressing   Problem: Clinical Measurements: Goal: Respiratory complications will improve Outcome: Progressing Goal: Cardiovascular complication will be avoided Outcome: Progressing

## 2024-01-10 DIAGNOSIS — I610 Nontraumatic intracerebral hemorrhage in hemisphere, subcortical: Secondary | ICD-10-CM | POA: Diagnosis not present

## 2024-01-10 DIAGNOSIS — E785 Hyperlipidemia, unspecified: Secondary | ICD-10-CM | POA: Diagnosis not present

## 2024-01-10 DIAGNOSIS — I739 Peripheral vascular disease, unspecified: Secondary | ICD-10-CM | POA: Diagnosis not present

## 2024-01-10 LAB — CBC
HCT: 36.7 % — ABNORMAL LOW (ref 39.0–52.0)
Hemoglobin: 12.2 g/dL — ABNORMAL LOW (ref 13.0–17.0)
MCH: 34.1 pg — ABNORMAL HIGH (ref 26.0–34.0)
MCHC: 33.2 g/dL (ref 30.0–36.0)
MCV: 102.5 fL — ABNORMAL HIGH (ref 80.0–100.0)
Platelets: 189 10*3/uL (ref 150–400)
RBC: 3.58 MIL/uL — ABNORMAL LOW (ref 4.22–5.81)
RDW: 14 % (ref 11.5–15.5)
WBC: 7.9 10*3/uL (ref 4.0–10.5)
nRBC: 0 % (ref 0.0–0.2)

## 2024-01-10 LAB — BASIC METABOLIC PANEL
Anion gap: 13 (ref 5–15)
BUN: 16 mg/dL (ref 8–23)
CO2: 25 mmol/L (ref 22–32)
Calcium: 8.3 mg/dL — ABNORMAL LOW (ref 8.9–10.3)
Chloride: 110 mmol/L (ref 98–111)
Creatinine, Ser: 1.27 mg/dL — ABNORMAL HIGH (ref 0.61–1.24)
GFR, Estimated: 55 mL/min — ABNORMAL LOW (ref >60)
Glucose, Bld: 125 mg/dL — ABNORMAL HIGH (ref 70–99)
Potassium: 3.3 mmol/L — ABNORMAL LOW (ref 3.5–5.1)
Sodium: 148 mmol/L — ABNORMAL HIGH (ref 135–145)

## 2024-01-10 LAB — GLUCOSE, CAPILLARY
Glucose-Capillary: 123 mg/dL — ABNORMAL HIGH (ref 70–99)
Glucose-Capillary: 122 mg/dL — ABNORMAL HIGH (ref 70–99)
Glucose-Capillary: 124 mg/dL — ABNORMAL HIGH (ref 70–99)
Glucose-Capillary: 125 mg/dL — ABNORMAL HIGH (ref 70–99)

## 2024-01-10 MED ORDER — HYDRALAZINE HCL 20 MG/ML IJ SOLN
5.0000 mg | INTRAMUSCULAR | Status: DC | PRN
  Administered 2024-01-10 – 2024-01-11 (×2): 5 mg via INTRAVENOUS
  Filled 2024-01-10 (×2): qty 1

## 2024-01-10 MED ORDER — LABETALOL HCL 5 MG/ML IV SOLN
10.0000 mg | INTRAVENOUS | Status: DC | PRN
  Administered 2024-01-11: 20 mg via INTRAVENOUS
  Administered 2024-01-11: 10 mg via INTRAVENOUS
  Administered 2024-01-12: 20 mg via INTRAVENOUS
  Filled 2024-01-10 (×3): qty 4

## 2024-01-10 MED ORDER — POTASSIUM CL IN DEXTROSE 5% 20 MEQ/L IV SOLN
20.0000 meq | INTRAVENOUS | Status: DC
  Administered 2024-01-10: 20 meq via INTRAVENOUS
  Filled 2024-01-10: qty 1000

## 2024-01-10 MED ORDER — METOPROLOL SUCCINATE ER 25 MG PO TB24
12.5000 mg | ORAL_TABLET | Freq: Every day | ORAL | Status: DC
  Administered 2024-01-10 – 2024-01-13 (×4): 12.5 mg via ORAL
  Filled 2024-01-10 (×4): qty 1

## 2024-01-10 NOTE — Progress Notes (Addendum)
 TRH ROUNDING NOTE John [name] JXB:[id number]  DOB: 1935-02-18  DOA: 01/05/2024  PCP: [name], MD  01/10/2024,9:26 AM  LOS: 5 days    Code Status: DNR   from: [facility] current Dispo: Unclear   89 year old male CABG 1996 Paroxysmal atrial fibrillation not on anticoagulation-orthostasis and increased falls--has POTS syndrome as well and previously was on Florinef HFpEF DM TY 2 Generalized anxiety disorder HTN CKD 3 AA hospitalization 2/7-2/15 2024 with fall accident acute left femoral neck fracture status post operative repair 12/03/2022 Prior intracranial hemorrhage after fall 01/15/2021 Previous gastrectomy and vagotomy for peptic ulcer disease Previous TURP  3/13 present from facility with fall found down at facility right-sided weakness around 11 AM he was dysarthric Potassium 2.6 BUN/creatinine 14/1.3 LFTs normal troponin 26--hemoglobin 12.8 WBC 14.6 Chest x-ray cardiomegaly central venous congestion?  Infiltrate left base vague right midlung opacity CT angio showed spots sign Intracranial aneurysm or AVM-9 mm IPH?--3.8 X1.3 left basal ganglia hematoma MRI brain unchanged appearance hematoma Patient admitted to the ICU by neurology critical care followed--placed on Cleviprex gtt. Additional Failed swallow eval evaluating in the next several days need for core track   Plan  3.8 X1.3 left basal ganglia ICH--CT head shows only 9 mm however No anticoagulation at this time-previously has had falls with POTS syndrome and was on Florinef As is n.p.o. continue fluids with D5 40 with 20 of K  Possible aspiration pneumonia White count elevated chest x-ray on arrival showed infiltrate-felt to be more atelectasis by formal report--continue Unasyn EOT 3/20 Speech therapy recommends keeping n.p.o.Downgraded to n.p.o and not passing swallow eval--- if does not graduate diet will need core track probably placed on 3/19 White count is coming down-monitor for further  issues  Paroxysmal A-fib no anticoagulation CHADVASC >4 Previously not on rate control secondary to presumed orthostasis--started meotprolol xl secondary to mild sinus tach/NSVT Not on anticoagulation because of falls and prior hip fracture etc. See below--consider Watchman device or aspirin or trial with neurology/cardiology  CABG 1996 HFpEF Echo this admission EF 60-65% grade 1 DD with moderate AoV regurgitation No intracardiac source of embolism-changing amlodipine to metoprolol given some mild sinus tach-watch for bradycardia As unable to take p.o. meds reliably added clonidine 0.1 can use labetalol for blood pressure above 160  POTS syndrome, orthostatic hypotension Elevated blood pressure earlier Earlier in hospitalization was on Cleviprex gtt. in the setting of intracranial hemorrhage Previously on Florinef 0.1 twice daily midodrine 1 tablet 2 times a day for low BP 5 mg in the outpatient setting which has been held  Mild hypokalemia Replace with KCl and fluids as above fluids--last magnesium was normal  Moderately controlled DM TY 2 A1c 5.2 Would discontinue aggressive checks given advanced age D5 water as above  Prostatism Needed In-N-Out cath on 3/15, now appears to be passing urine-observe Continue Flomax 0.4 daily   Discussed with patient's daughter on 3/17  DVT prophylaxis: SCD  Status is: Inpatient Remains inpatient appropriate because:   Needs further workup-await ability to take p.o.   Subjective:  Wants to eat-says he has not eaten in 3 days Still on oxygen-no chest pain motor is intact His speech is somewhat slurred and is difficult to understand him  Objective + exam Vitals:   01/10/24 0148 01/10/24 0400 01/10/24 0520 01/10/24 0824  BP: (!) 151/58 (!) 196/87 (!) 148/50 (!) 183/90  Pulse: 77 93 73 92  Resp:  20 18   Temp: 98.3 F (36.8 C) 98.1 F (36.7 C) 98.1  F (36.7 C) 98.1 F (36.7 C)  TempSrc: Oral Oral Oral Oral  SpO2: 92% 91% 93% 95%   Weight:      Height:       Filed Weights   01/05/24 1631  Weight: 82.6 kg    Examination:  EOMI NCAT and unintelligible speech at times  -facial twisting to the right Coarse breath stones posterolaterally on the right side with rhonchi Abdomen soft no rebound No lower extremity edema Power 5/5 upper lower extremities moving equally Finger-nose-finger intact Psych flat   Data Reviewed: reviewed   CBC    Component Value Date/Time   WBC 7.9 01/10/2024 0504   RBC 3.58 (L) 01/10/2024 0504   HGB 12.2 (L) 01/10/2024 0504   HGB 13.4 12/15/2016 1705   HCT 36.7 (L) 01/10/2024 0504   HCT 39.2 12/15/2016 1705   PLT 189 01/10/2024 0504   PLT 206 12/15/2016 1705   MCV 102.5 (H) 01/10/2024 0504   MCV 92 12/15/2016 1705   MCH 34.1 (H) 01/10/2024 0504   MCHC 33.2 01/10/2024 0504   RDW 14.0 01/10/2024 0504   RDW 13.4 12/15/2016 1705   LYMPHSABS 1.5 01/05/2024 1247   LYMPHSABS 1.8 12/15/2016 1705   MONOABS 0.4 01/05/2024 1247   EOSABS 0.2 01/05/2024 1247   EOSABS 0.1 12/15/2016 1705   BASOSABS 0.0 01/05/2024 1247   BASOSABS 0.0 12/15/2016 1705      Latest Ref Rng & Units 01/10/2024    5:04 AM 01/09/2024    5:03 AM 01/08/2024    6:30 AM  CMP  Glucose 70 - 99 mg/dL 784  696  295   BUN 8 - 23 mg/dL 16  17  19    Creatinine 0.61 - 1.24 mg/dL 2.84  1.32  4.40   Sodium 135 - 145 mmol/L 148  143  144   Potassium 3.5 - 5.1 mmol/L 3.3  3.1  3.2   Chloride 98 - 111 mmol/L 110  108  108   CO2 22 - 32 mmol/L 25  24  24    Calcium 8.9 - 10.3 mg/dL 8.3  8.3  8.8     Scheduled Meds:  cloNIDine  0.1 mg Transdermal Weekly   enoxaparin (LOVENOX) injection  40 mg Subcutaneous Q24H   insulin aspart  0-5 Units Subcutaneous QHS   insulin aspart  0-9 Units Subcutaneous TID WC   metoprolol succinate  12.5 mg Oral Daily   tamsulosin  0.4 mg Oral QPC supper   Continuous Infusions:  ampicillin-sulbactam (UNASYN) IV 3 g (01/10/24 0328)   dextrose 5 % with KCl 20 mEq / L      Time   25  Rhetta Mura, MD  [facility]

## 2024-01-10 NOTE — Plan of Care (Signed)
 Elevated blood pressure, prn meds given. Resting comfortably as of this time.  Problem: Education: Goal: Knowledge of disease or condition will improve Outcome: Progressing   Problem: Intracerebral Hemorrhage Tissue Perfusion: Goal: Complications of Intracerebral Hemorrhage will be minimized Outcome: Progressing   Problem: Self-Care: Goal: Ability to communicate needs accurately will improve Outcome: Progressing   Problem: Nutrition: Goal: Risk of aspiration will decrease Outcome: Progressing   Problem: Clinical Measurements: Goal: Diagnostic test results will improve Outcome: Progressing   Problem: Coping: Goal: Level of anxiety will decrease Outcome: Progressing   Problem: Activity: Goal: Risk for activity intolerance will decrease Outcome: Progressing   Problem: Safety: Goal: Ability to remain free from injury will improve Outcome: Progressing

## 2024-01-10 NOTE — Progress Notes (Signed)
 Physical Therapy Treatment Patient Details Name: John [name] MRN: [id number] DOB: 1935/05/31 Today's Date: 01/10/2024   History of Present Illness Pt is 89 year old presented to [facility] on  01/05/24 for sudden onset of rt sided weakness. Pt found to have lt basal ganglia ICH. PMH - Lt hip fx, cabg, paroxysmal atrial fibrillation, dm, hypertension, OSA, chronic diastolic heart failure    PT Comments  Pt received in bed with brother in law present. Pt appears fatigued, relays he has not eaten in several days. Speech following. Pt needed more assist with mobility today, required mod A to come to sitting EOB. Pt with dizziness in sitting that subsided with time and LE and UE there but not completely. Pt needed min A +2 to stand and step to chair with RW. SPO2 dropped from 93% on 2L O2 to 84% on RA. Recovered to 93% with replacement of O2 via [place]. Patient will benefit from continued inpatient follow up therapy, <3 hours/day. PT will continue to follow.      If plan is discharge home, recommend the following: A little help with walking and/or transfers;A little help with bathing/dressing/bathroom;Direct supervision/assist for medications management;Assist for transportation   Can travel by private vehicle     Yes  Equipment Recommendations  None recommended by PT    Recommendations for Other Services       Precautions / Restrictions Precautions Precautions: Fall;Other (comment) Precaution/Restrictions Comments: Goal SBP < 160 Restrictions Weight Bearing Restrictions Per Provider Order: No     Mobility  Bed Mobility Overal bed mobility: Needs Assistance Bed Mobility: Supine to Sit     Supine to sit: HOB elevated, Mod assist     General bed mobility comments: pt moves LE's to R side of bed when cued but needed min A to bring them all the way off bed and then hand over hand guidance of L hand to R bedrail and mod A for elevation of trunk into sitting. Once up pt was able to scoot hips  to EOB. Pt dizzy in sitting    Transfers Overall transfer level: Needs assistance Equipment used: Rolling walker (2 wheels) Transfers: Sit to/from Stand, Bed to chair/wheelchair/BSC Sit to Stand: Min assist, +2 physical assistance   Step pivot transfers: Min assist, +2 physical assistance       General transfer comment: Assist to power up and for balance, +2 for safety as pt was relaying dizziness and has not had nutrition. Pt able to pick up each foot for stepping to recliner, decreased wt shift to R    Ambulation/Gait               General Gait Details: deferred due to pt feeling dizzy and has not eaten in several days, as well as being fatigued from the transfer   Stairs             Wheelchair Mobility     Tilt Bed    Modified Rankin (Stroke Patients Only)       Balance Overall balance assessment: Needs assistance Sitting-balance support: No upper extremity supported, Feet supported Sitting balance-Leahy Scale: Fair Sitting balance - Comments: CGA Postural control: Posterior lean Standing balance support: Reliant on assistive device for balance Standing balance-Leahy Scale: Poor Standing balance comment: UE support and CGA for static standing                            Communication Communication Communication: Impaired Factors Affecting Communication:  Difficulty expressing self;Reduced clarity of speech  Cognition Arousal: Lethargic Behavior During Therapy: Flat affect   PT - Cognitive impairments: Safety/Judgement, Difficult to assess Difficult to assess due to: Impaired communication                     PT - Cognition Comments: slurred speech, very difficult to understand. Follows basic commands Following commands: Impaired Following commands impaired: Follows one step commands inconsistently    Cueing Cueing Techniques: Verbal cues, Tactile cues  Exercises General Exercises - Lower Extremity Long Arc Quad: AROM, Both,  10 reps, Seated Hip Flexion/Marching: AROM, Both, 10 reps, Seated    General Comments General comments (skin integrity, edema, etc.): SPO2 93% on 2L O2. Dropped to 84% on RA. Returned to 93% with replacement of 2L      Pertinent Vitals/Pain Pain Assessment Pain Assessment: No/denies pain    Home Living                          Prior Function            PT Goals (current goals can now be found in the care plan section) Acute Rehab PT Goals Patient Stated Goal: not stated PT Goal Formulation: With patient Time For Goal Achievement: 01/21/24 Potential to Achieve Goals: Good Progress towards PT goals: Not progressing toward goals - comment (dizziness and fatigue)    Frequency    Min 2X/week      PT Plan      Co-evaluation              AM-PAC PT "6 Clicks" Mobility   Outcome Measure  Help needed turning from your back to your side while in a flat bed without using bedrails?: A Little Help needed moving from lying on your back to sitting on the side of a flat bed without using bedrails?: A Lot Help needed moving to and from a bed to a chair (including a wheelchair)?: A Lot Help needed standing up from a chair using your arms (e.g., wheelchair or bedside chair)?: A Lot Help needed to walk in hospital room?: Total   6 Click Score: 10    End of Session Equipment Utilized During Treatment: Gait belt Activity Tolerance: Patient tolerated treatment well Patient left: in chair;with call bell/phone within reach;with chair alarm set;with family/visitor present Nurse Communication: Mobility status;Other (comment) (O2 sats) PT Visit Diagnosis: Unsteadiness on feet (R26.81);Other abnormalities of gait and mobility (R26.89);Muscle weakness (generalized) (M62.81);History of falling (Z91.81)     Time: 1308-6578 PT Time Calculation (min) (ACUTE ONLY): 28 min  Charges:    $Therapeutic Exercise: 8-22 mins $Therapeutic Activity: 8-22 mins PT General Charges $$  ACUTE PT VISIT: 1 Visit                     Lyanne Co, PT  Acute Rehab Services Secure chat preferred Office [phone]    Lawana Chambers Jasdeep Kepner 01/10/2024, 4:25 PM

## 2024-01-10 NOTE — TOC Progression Note (Signed)
 Transition of Care [facility]) - Progression Note    Patient Details  Name: RAESHAWN VO MRN: [id number] Date of Birth: 1935-09-21  [facility] CM/SW Contact  Baldemar Lenis, [place] Phone Number: 01/10/2024, 11:23 AM  Clinical Narrative:   CSW following for disposition needs. Patient not medically stable, still NPO with possible cortrak placement. Awaiting for needs when closer to medically stable. CSW to follow.    Expected Discharge Plan: Skilled Nursing Facility Barriers to Discharge: Continued Medical Work up, English as a second language teacher  Expected Discharge Plan and Services       Living arrangements for the past 2 months: Assisted Living Facility                                       Social Determinants of Health (SDOH) Interventions SDOH Screenings   Food Insecurity: No Food Insecurity (01/05/2024)  Housing: Low Risk  (01/05/2024)  Transportation Needs: No Transportation Needs (01/05/2024)  Utilities: Not At Risk (01/05/2024)  Depression (PHQ2-9): Low Risk  (06/30/2021)  Social Connections: Unknown (01/05/2024)  Tobacco Use: Medium Risk (01/05/2024)    Readmission Risk Interventions     No data to display

## 2024-01-10 NOTE — Progress Notes (Addendum)
 STROKE TEAM PROGRESS NOTE   INTERIM HISTORY/SUBJECTIVE Brother is at the bedside. Pt sitting in chair, drowsy sleepy able to arouse with stimulation but back to sleep without stimulation. But while arouse, he answered all orientated questions right. Still n.p.o on IV fluid.   OBJECTIVE  CBC    Component Value Date/Time   WBC 7.9 01/10/2024 0504   RBC 3.58 (L) 01/10/2024 0504   HGB 12.2 (L) 01/10/2024 0504   HGB 13.4 12/15/2016 1705   HCT 36.7 (L) 01/10/2024 0504   HCT 39.2 12/15/2016 1705   PLT 189 01/10/2024 0504   PLT 206 12/15/2016 1705   MCV 102.5 (H) 01/10/2024 0504   MCV 92 12/15/2016 1705   MCH 34.1 (H) 01/10/2024 0504   MCHC 33.2 01/10/2024 0504   RDW 14.0 01/10/2024 0504   RDW 13.4 12/15/2016 1705   LYMPHSABS 1.5 01/05/2024 1247   LYMPHSABS 1.8 12/15/2016 1705   MONOABS 0.4 01/05/2024 1247   EOSABS 0.2 01/05/2024 1247   EOSABS 0.1 12/15/2016 1705   BASOSABS 0.0 01/05/2024 1247   BASOSABS 0.0 12/15/2016 1705    BMET    Component Value Date/Time   NA 148 (H) 01/10/2024 0504   NA 144 12/15/2016 1705   K 3.3 (L) 01/10/2024 0504   CL 110 01/10/2024 0504   CO2 25 01/10/2024 0504   GLUCOSE 125 (H) 01/10/2024 0504   BUN 16 01/10/2024 0504   BUN 21 12/15/2016 1705   CREATININE 1.27 (H) 01/10/2024 0504   CREATININE 1.21 12/30/2022 1623   CALCIUM 8.3 (L) 01/10/2024 0504   EGFR 58 (L) 12/30/2022 1623   GFRNONAA 55 (L) 01/10/2024 0504   GFRNONAA 52 (L) 07/24/2020 1233    IMAGING past 24 hours No results found.   Vitals:   01/10/24 0824 01/10/24 1107 01/10/24 1159 01/10/24 1237  BP: (!) 183/90 (!) 184/78 (!) 192/100 (!) 174/82  Pulse: 92 83 88   Resp:      Temp: 98.1 F (36.7 C)  97.6 F (36.4 C)   TempSrc: Oral  Oral   SpO2: 95%  91%   Weight:      Height:         PHYSICAL EXAM General:  Alert, well-nourished, well-developed elderly Caucasian male in no acute distress CV: Rhythm regular, not in afib  Respiratory:  Regular, unlabored respirations  on room air GI: Abdomen soft and nontender  Neuro - drowsy sleepy but arousable, orientated to age, place, time and people but with moderate to severe dysarthria. No aphasia but paraphasic errors with pressured speech and became word salad with long sentences. Able to name 3/4 but with mild perseveration. Able to repeat short sentence in dysarthric voice but with longer sentence he became word salad. No gaze palsy, tracking bilaterally, visual field full, PERRL. Right facial droop. Tongue protrusion to the right. LUE 4/5 proximal and 5/5 distally. RUE 4/5 with pronator drift but distally 5/5. BLEs proximal 4/5 and 5/5 distally. Sensation symmetrical bilaterally subjectively, b/l FTN intact but slow, gait not tested.     ASSESSMENT/PLAN  Mr. KDYN VONBEHREN is a 89 y.o. male with history of A-fib not on anticoagulation, CAD, CKD stage II, diabetes and hyperlipidemia admitted for acute onset right-sided weakness resulting in a fall.  Patient was found to have a small ICH in the left basal ganglia on CT.  He reports that his symptoms have improved somewhat overnight. NIH on Admission 8  ICH:  left BG ICH, etiology likely hypertensive with small vessel disease Code Stroke  CT head 9 mm acute IPH centered within left basal ganglia with surrounding edema, chronic encephalomalacia in right superior frontal gyrus at site of old IPH, background atrophy and chronic small vessel ischemic disease CTA head & neck no LVO, AVM, spot sign or aneurysm, generalized arterial tortuosity MRI with and without contrast stable left BG ICH 2D Echo EF 60 to 65%, moderately dilated left atrium  LDL 121 HgbA1c 5.2 VTE prophylaxis -lovenox No antithrombotic prior to admission, now on No antithrombotic secondary to ICH Therapy recommendations: SNF Disposition: Pending  Hx of PAF Home Meds: None Per daughter, pt probably had one brief PAF while he had hip surgery in the hospital but no further issue after that Currently  in sinus rhythm He follows with Dr. Clifton James as outpt No AC at this time due to acute ICH May consider watchman device or ASPIRE trial, follow up with neurology and cardiology as outpt.   Hypertension Home meds: Amlodipine 5 mg daily BP on the high end Was on norvasc 10 and metoprolol 25 bid - on hold due to n.p.o. Now on clonidine patch Labetalol and hydralazine IV PRN BP goal < 160 Long term BP goal normotensive  Hyperlipidemia Home meds: None LDL 121, goal < 70 On lipitor 40 Continue statin on discharge  Diabetes type II Controlled Home meds: None HgbA1c 5.2, goal < 7.0 CBGs SSI Recommend close follow-up with PCP for better DM control  Dysphagia  Did not pass swallow  Now NPO Consider core track on Wednesday and then resume po BP meds May need to consider PEG if going to SNF Speech on board  Other Stroke Risk Factors Coronary artery disease Advanced age  Other Active Problems AKI on CKD 3a, Cre 1.25--1.23--1.32--1.37--1.27 Hypokalemia, potassium 3.1, supplement  Hospital day # 5  Neurology will sign off. Please call with questions. Pt will follow up with stroke clinic NP at [facility] in about 4 weeks. Thanks for the consult.   Marvel Plan, MD PhD Stroke Neurology 01/10/2024 4:57 PM    To contact Stroke Continuity provider, please refer to [email or web address]. After hours, contact General Neurology

## 2024-01-10 NOTE — Progress Notes (Signed)
 Speech Language Pathology Treatment: Dysphagia  Patient Details Name: John [name] MRN: [id number] DOB: Oct 17, 1935 Today's Date: 01/10/2024 Time: 0865-7846 SLP Time Calculation (min) (ACUTE ONLY): 18 min  Assessment / Plan / Recommendation Clinical Impression  Pt shows mild improvements from previous date. He is more alert for a longer period time of time, and is verbally making requests for POs. He still has trouble consistently eliciting a second swallow, but he does do it sometimes (especially on earlier trials, before he likely fatigued). He had no overt s/s of aspiration, which seems to be improved from previous date although it does not completely rule out aspiration as his sensation was intermittent on previous MBS. Education provided to pt and grandson about MBS results and POC. Discussed that if he continues to look improved tomorrow, we should try to repeat MBS prior to having to make a decision regarding cortrak placement. Until repeat MBS, would still limit POs to essential meds crushed in puree and ice chips after oral care, following strategies listed below.    HPI HPI: John [name] is a 89 y.o. male with hx of A-fib not on anticoagulation, CAD, CKD stage II, DM, hyperlipidemia who presents with right-sided weakness that started abruptly.  He was in his normal state of health when he woke up this morning, but got up to go to the bathroom around 11 AM and when he did so, he suddenly developed right-sided weakness that caused him to fall.  He does not think that he hit his head.  He was found to have left basal ganglia intraparenchymal hematoma.  Patient initially passed the [facility] Screen on 01/05/2024 but began to have issues swallowing this date.      SLP Plan  MBS      Recommendations for follow up therapy are one component of a multi-disciplinary discharge planning process, led by the attending physician.  Recommendations may be updated based on patient status,  additional functional criteria and insurance authorization.    Recommendations  Diet recommendations: NPO;Other(comment) (few, single pieces of ice after oral care if aler) Medication Administration: Crushed with puree (essential meds crushed in puree with strategies outlined below) Supervision: Staff to assist with self feeding;Full supervision/cueing for compensatory strategies Compensations: Minimize environmental distractions;Slow rate;Small sips/bites;Multiple dry swallows after each bite/sip (swallow x2 per bolus) Postural Changes and/or Swallow Maneuvers:  (Partially reclined posture)                  Oral care QID;Oral care prior to ice chip/H20   Frequent or constant Supervision/Assistance Dysphagia, oropharyngeal phase (R13.12)     MBS     John [name]., M.A. CCC-SLP Acute Rehabilitation Services Office [phone]  Secure chat preferred   01/10/2024, 3:35 PM

## 2024-01-11 ENCOUNTER — Inpatient Hospital Stay (HOSPITAL_COMMUNITY)

## 2024-01-11 DIAGNOSIS — I610 Nontraumatic intracerebral hemorrhage in hemisphere, subcortical: Secondary | ICD-10-CM | POA: Diagnosis not present

## 2024-01-11 LAB — CBC
HCT: 39.2 % (ref 39.0–52.0)
Hemoglobin: 13.2 g/dL (ref 13.0–17.0)
MCH: 34.3 pg — ABNORMAL HIGH (ref 26.0–34.0)
MCHC: 33.7 g/dL (ref 30.0–36.0)
MCV: 101.8 fL — ABNORMAL HIGH (ref 80.0–100.0)
Platelets: 216 10*3/uL (ref 150–400)
RBC: 3.85 MIL/uL — ABNORMAL LOW (ref 4.22–5.81)
RDW: 13.6 % (ref 11.5–15.5)
WBC: 6.5 10*3/uL (ref 4.0–10.5)
nRBC: 0 % (ref 0.0–0.2)

## 2024-01-11 LAB — BASIC METABOLIC PANEL
Anion gap: 14 (ref 5–15)
BUN: 15 mg/dL (ref 8–23)
CO2: 25 mmol/L (ref 22–32)
Calcium: 8.4 mg/dL — ABNORMAL LOW (ref 8.9–10.3)
Chloride: 107 mmol/L (ref 98–111)
Creatinine, Ser: 1.21 mg/dL (ref 0.61–1.24)
GFR, Estimated: 58 mL/min — ABNORMAL LOW (ref >60)
Glucose, Bld: 124 mg/dL — ABNORMAL HIGH (ref 70–99)
Potassium: 3.3 mmol/L — ABNORMAL LOW (ref 3.5–5.1)
Sodium: 146 mmol/L — ABNORMAL HIGH (ref 135–145)

## 2024-01-11 LAB — GLUCOSE, CAPILLARY
Glucose-Capillary: 126 mg/dL — ABNORMAL HIGH (ref 70–99)
Glucose-Capillary: 190 mg/dL — ABNORMAL HIGH (ref 70–99)
Glucose-Capillary: 114 mg/dL — ABNORMAL HIGH (ref 70–99)
Glucose-Capillary: 157 mg/dL — ABNORMAL HIGH (ref 70–99)

## 2024-01-11 MED ORDER — HYDRALAZINE HCL 20 MG/ML IJ SOLN
10.0000 mg | INTRAMUSCULAR | Status: DC | PRN
  Administered 2024-01-11: 10 mg via INTRAVENOUS
  Filled 2024-01-11: qty 1

## 2024-01-11 MED ORDER — AMLODIPINE BESYLATE 5 MG PO TABS
5.0000 mg | ORAL_TABLET | Freq: Every day | ORAL | Status: DC
  Administered 2024-01-11: 5 mg via ORAL
  Filled 2024-01-11 (×2): qty 1

## 2024-01-11 MED ORDER — POTASSIUM CL IN DEXTROSE 5% 20 MEQ/L IV SOLN
20.0000 meq | INTRAVENOUS | Status: AC
  Filled 2024-01-11: qty 1000

## 2024-01-11 MED ORDER — POTASSIUM CHLORIDE CRYS ER 20 MEQ PO TBCR
40.0000 meq | EXTENDED_RELEASE_TABLET | Freq: Once | ORAL | Status: AC
  Administered 2024-01-11: 40 meq via ORAL
  Filled 2024-01-11: qty 2

## 2024-01-11 MED ORDER — HYDRALAZINE HCL 25 MG PO TABS
25.0000 mg | ORAL_TABLET | Freq: Four times a day (QID) | ORAL | Status: DC | PRN
  Administered 2024-01-12 – 2024-01-13 (×2): 25 mg via ORAL
  Filled 2024-01-11 (×2): qty 1

## 2024-01-11 MED ORDER — DEXTROSE 5 % IV SOLN: INTRAVENOUS | Status: DC

## 2024-01-11 NOTE — [place] FL2 (Addendum)
 [place] MEDICAID FL2 LEVEL OF CARE FORM     IDENTIFICATION  Patient Name: John [name] Birthdate: 1935/06/09 Sex: male Admission Date (Current Location): 01/05/2024  [facility] and [place] Number:  Producer, television/film/video and Address:  [facility], [address] [place], [place] [address]      Provider Number: [id number]  Attending Physician Name and Address:  Almon Hercules, MD  Relative Name and Phone Number:       Current Level of Care: Hospital Recommended Level of Care: Skilled Nursing Facility Prior Approval Number:    Date Approved/Denied:   PASRR Number: [id number]  Discharge Plan: SNF    Current Diagnoses: Patient Active Problem List   Diagnosis Date Noted   ICH (intracerebral hemorrhage) (HCC) 01/05/2024   Left displaced femoral neck fracture (HCC) 12/01/2022   Fall 12/01/2022   Lactic acidosis 12/01/2022   Nodule of middle lobe of right lung 12/01/2022   Paroxysmal atrial fibrillation (HCC) 12/01/2022   Chronic diastolic CHF (congestive heart failure) (HCC) 12/01/2022   GAD (generalized anxiety disorder) 12/01/2022   BPH (benign prostatic hyperplasia) 12/01/2022   COVID-19 virus infection 11/25/2021   History of intracranial hemorrhage 11/25/2021   Near syncope 11/25/2021   CAD (coronary artery disease)    Intracranial bleed (HCC) 01/15/2021   SAH (subarachnoid hemorrhage) (HCC)    Non-traumatic rhabdomyolysis    Primary osteoarthritis of left knee 09/26/2019   S/P left knee arthroscopy 09/26/2019   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HCC) 09/25/2019   Adrenal insufficiency (HCC) 09/25/2019   Acute medial meniscus tear of left knee 08/09/2019   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 anxiety 02/19/2018   Diabetic peripheral neuropathy (HCC) 11/21/2017   BMI 21.0-21.9, adult 09/04/2015   GERD (gastroesophageal reflux disease) 07/09/2015   Autonomic postural hypotension 02/06/2015   CKD stage G3a/A1, GFR 45-59 and albumin creatinine  ratio <30 mg/g (HCC) 03/20/2014   Medication management 02/06/2014   Vitamin D deficiency 02/06/2014   Gastric AV malformation 10/30/2012   History of colonic polyps 09/14/2011   DM2 (diabetes mellitus, type 2) (HCC) 03/12/2009   HLD (hyperlipidemia) 03/12/2009   Essential hypertension 03/12/2009   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) 03/12/2009   History of TIA (transient ischemic attack) 03/12/2009   Asthma 03/12/2009   COPD (chronic obstructive pulmonary disease) (HCC) 03/12/2009   GERD 03/12/2009    Orientation RESPIRATION BLADDER Height & Weight        O2 ([place] 2 L) Incontinent Weight: 182 lb 1.6 oz (82.6 kg) Height:  6' (182.9 cm)  BEHAVIORAL SYMPTOMS/MOOD NEUROLOGICAL BOWEL NUTRITION STATUS      Incontinent Diet (DYS 2)  AMBULATORY STATUS COMMUNICATION OF NEEDS Skin   Extensive Assist Verbally Normal                       Personal Care Assistance Level of Assistance  Bathing, Feeding, Dressing Bathing Assistance: Maximum assistance Feeding assistance: Limited assistance Dressing Assistance: Maximum assistance     Functional Limitations Info             SPECIAL CARE FACTORS FREQUENCY  PT (By licensed PT), OT (By licensed OT)     PT Frequency: 5x/week OT Frequency: 5x/week            Contractures      Additional Factors Info  Code Status, Allergies Code Status Info: DNR Allergies Info: Gabapentin, Prozac (Fluoxetine Hcl), Soma (Carisoprodol), Beta Adrenergic Blockers  Current Medications (01/11/2024):  This is the current hospital active medication list Current Facility-Administered Medications  Medication Dose Route Frequency Provider Last Rate Last Admin   acetaminophen (TYLENOL) tablet 650 mg  650 mg Oral Q4H PRN Rejeana Brock, MD   650 mg at 01/06/24 8413   Or   acetaminophen (TYLENOL) 160 MG/5ML solution 650 mg  650 mg Per Tube Q4H PRN Rejeana Brock, MD        Or   acetaminophen (TYLENOL) suppository 650 mg  650 mg Rectal Q4H PRN Rejeana Brock, MD       Ampicillin-Sulbactam (UNASYN) 3 g in sodium chloride 0.9 % 100 mL IVPB  3 g Intravenous Q6H Salam, Savannah B, RPH 200 mL/hr at 01/11/24 1002 3 g at 01/11/24 1002   cloNIDine (CATAPRES - Dosed in mg/24 hr) patch 0.1 mg  0.1 mg Transdermal Weekly Rhetta Mura, MD   0.1 mg at 01/09/24 1540   enoxaparin (LOVENOX) injection 40 mg  40 mg Subcutaneous Q24H Pearlean Brownie, Pramod S, MD   40 mg at 01/11/24 1154   hydrALAZINE (APRESOLINE) injection 10 mg  10 mg Intravenous Q4H PRN Candelaria Stagers T, MD   10 mg at 01/11/24 1155   insulin aspart (novoLOG) injection 0-5 Units  0-5 Units Subcutaneous QHS Ogan, Okoronkwo U, MD       insulin aspart (novoLOG) injection 0-9 Units  0-9 Units Subcutaneous TID WC Migdalia Dk, MD   2 Units at 01/11/24 1153   labetalol (NORMODYNE) injection 10-20 mg  10-20 mg Intravenous Q2H PRN Marvel Plan, MD   10 mg at 01/11/24 0146   LORazepam (ATIVAN) injection 0.5 mg  0.5 mg Intravenous QHS PRN Rhetta Mura, MD       metoprolol succinate (TOPROL-XL) 24 hr tablet 12.5 mg  12.5 mg Oral Daily Rhetta Mura, MD   12.5 mg at 01/11/24 0956   tamsulosin (FLOMAX) capsule 0.4 mg  0.4 mg Oral QPC supper Marvel Plan, MD   0.4 mg at 01/10/24 1719     Discharge Medications: Please see discharge summary for a list of discharge medications.  Relevant Imaging Results:  Relevant Lab Results:   Additional Information SS#: [id number]  Antion Felipa Emory, Student-Social Work

## 2024-01-11 NOTE — Evaluation (Addendum)
 Occupational Therapy Treatment Patient Details Name: John [name] MRN: [id number] DOB: 06/10/1935 Today's Date: 01/11/2024   History of Present Illness   Pt is 89 year old presented to [facility] on  01/05/24 for sudden onset of rt sided weakness. Pt found to have lt basal ganglia ICH. PMH - Lt hip fx, cabg, paroxysmal atrial fibrillation, dm, hypertension, OSA, chronic diastolic heart failure     Clinical Impressions During today's session pt setup to complete sitting ADLs, no inattention noted of this date but pt did have harder time locating objects on both his R and L fields. Mild RUE apraxia noted during ADLs, pt was able to progress to STS with CGA, had a moment of confusion during steps to [facility] where pt turned around. Would be good to continue to watch for other signs of apraxia/motor planning. OT to continue following pt acutely to address listed deficits and help transition to next level of care. DC plans continue to be good for SNF at this time.      If plan is discharge home, recommend the following:   A lot of help with bathing/dressing/bathroom;A little help with walking and/or transfers;Assist for transportation;Assistance with cooking/housework;Help with stairs or ramp for entrance;Supervision due to cognitive status     Functional Status Assessment   Patient has had a recent decline in their functional status and demonstrates the ability to make significant improvements in function in a reasonable and predictable amount of time.     Equipment Recommendations   BSC/3in1     Recommendations for Other Services         Precautions/Restrictions   Precautions Precautions: Fall;Other (comment) Precaution/Restrictions Comments: Goal SBP < 160 Restrictions Weight Bearing Restrictions Per Provider Order: No     Mobility Bed Mobility Overal bed mobility: Needs Assistance Bed Mobility: Supine to Sit, Sit to Supine     Supine to sit: Contact guard, Used rails,  HOB elevated Sit to supine: Contact guard assist        Transfers Overall transfer level: Needs assistance Equipment used: Rolling walker (2 wheels) Transfers: Sit to/from Stand Sit to Stand: Min assist, Contact guard assist           General transfer comment: STS min A initially from EOB, cues for hand placement using bed instead of one UE on RW. Pt able to progress to CGA for STS with this technique (STSx2 using this method)      Balance Overall balance assessment: Needs assistance Sitting-balance support: No upper extremity supported, Feet supported Sitting balance-Leahy Scale: Fair Sitting balance - Comments: CGA   Standing balance support: Reliant on assistive device for balance, During functional activity, Bilateral upper extremity supported Standing balance-Leahy Scale: Poor Standing balance comment: CGA static standing with RW                           ADL either performed or assessed with clinical judgement   ADL       Grooming: Oral care;Wash/dry hands;Applying deodorant;Sitting;Set up Grooming Details (indicate cue type and reason): a bit of motor apraxia with RUE when brushing teeth. Noted to have awkward grasp but corrects it at times to appropriate grasp for angle, would apply deodorant under L armpit using LUE- cues to use opposing UE to reach across body for easier application  General ADL Comments: Tray of ADLs setup in front of patient, pt needing occasional cues to locate and object on R and L side.     Vision         Perception     Perception-Other Comments: Difficult to assess, pt locating more obejcts on R with cues. Also needed cues to locate comb on the L side so it may be overall a vision issue.   Praxis         Pertinent Vitals/Pain Pain Assessment Pain Assessment: No/denies pain     Extremity/Trunk Assessment Upper Extremity Assessment RUE Deficits / Details: Some mild RUE motor  apraxia when using it with ADLs, uses it functionally           Communication Communication Communication: Impaired Factors Affecting Communication: Difficulty expressing self;Reduced clarity of speech   Cognition Arousal: Alert Behavior During Therapy: Flat affect Cognition: No apparent impairments                               Following commands: Impaired Following commands impaired: Only follows one step commands consistently     Cueing  General Comments   Cueing Techniques: Verbal cues;Tactile cues  VSS on 2L   Exercises     Shoulder Instructions      Home Living                                          Prior Functioning/Environment                      OT Problem List: Decreased strength;Decreased activity tolerance;Impaired balance (sitting and/or standing);Decreased cognition;Decreased safety awareness;Decreased knowledge of use of DME or AE   OT Treatment/Interventions: Self-care/ADL training;Therapeutic exercise;Therapeutic activities;Energy conservation;DME and/or AE instruction;Patient/family education;Balance training      OT Goals(Current goals can be found in the care plan section)   Acute Rehab OT Goals Patient Stated Goal: none stated Time For Goal Achievement: 01/23/24 Potential to Achieve Goals: Fair ADL Goals Pt Will Perform Grooming: with supervision;standing Pt Will Perform Lower Body Bathing: with set-up;sitting/lateral leans Pt Will Perform Upper Body Dressing: with set-up;sitting Pt Will Perform Lower Body Dressing: with supervision;sit to/from stand Pt Will Transfer to Toilet: with supervision;ambulating   OT Frequency:  Min 2X/week    Co-evaluation              AM-PAC OT "6 Clicks" Daily Activity     Outcome Measure Help from another person eating meals?: A Little (upgraded to Dys 2 diet) Help from another person taking care of personal grooming?: A Little Help from another person  toileting, which includes using toliet, bedpan, or urinal?: A Lot Help from another person bathing (including washing, rinsing, drying)?: A Lot Help from another person to put on and taking off regular upper body clothing?: A Lot Help from another person to put on and taking off regular lower body clothing?: A Lot 6 Click Score: 14   End of Session Equipment Utilized During Treatment: Gait belt;Rolling walker (2 wheels) Nurse Communication: Mobility status  Activity Tolerance: Patient tolerated treatment well Patient left: in bed;with call bell/phone within reach;with bed alarm set  OT Visit Diagnosis: Unsteadiness on feet (R26.81);Muscle weakness (generalized) (M62.81);Other symptoms and signs involving cognitive function  Time: 1027-2536 OT Time Calculation (min): 28 min Charges:  OT General Charges $OT Visit: 1 Visit OT Treatments $Self Care/Home Management : 8-22 mins $Therapeutic Activity: 8-22 mins  01/11/2024  AB, OTR/L  Acute Rehabilitation Services  Office: [phone]   Tristan Schroeder 01/11/2024, 12:53 PM

## 2024-01-11 NOTE — Plan of Care (Signed)
 Had hypertenvise episode, prn meds given, no other complaints noted.  Problem: Intracerebral Hemorrhage Tissue Perfusion: Goal: Complications of Intracerebral Hemorrhage will be minimized Outcome: Progressing   Problem: Coping: Goal: Will verbalize positive feelings about self Outcome: Progressing   Problem: Self-Care: Goal: Ability to participate in self-care as condition permits will improve Outcome: Progressing   Problem: Nutrition: Goal: Risk of aspiration will decrease Outcome: Progressing   Problem: Activity: Goal: Risk for activity intolerance will decrease Outcome: Progressing   Problem: Safety: Goal: Ability to remain free from injury will improve Outcome: Progressing

## 2024-01-11 NOTE — Evaluation (Signed)
 Modified Barium Swallow Study  Patient Details  Name: John [name] MRN: [id number] Date of Birth: 06/21/1935  Today's Date: 01/11/2024  Modified Barium Swallow completed.  Full report located under Chart Review in the Imaging Section.  History of Present Illness John [name] is a 89 y.o. male with hx of A-fib not on anticoagulation, CAD, CKD stage II, DM, hyperlipidemia who presents with right-sided weakness that started abruptly.  He was in his normal state of health when he woke up this morning, but got up to go to the bathroom around 11 AM and when he did so, he suddenly developed right-sided weakness that caused him to fall.  He does not think that he hit his head.  He was found to have left basal ganglia intraparenchymal hematoma.  Patient initially passed the [facility] Screen on 01/05/2024 but began to have issues swallowing this date.   Clinical Impression Pt has made mild improvements in oropharyngeal function, still also impacted by posture and positioning as well. He has improved lingual control and posterior propulsion, with transit slow at times but not repetitive/disorganized. He also has some improvements pharyngeally with base of tongue retraction and laryngeal elevation. As a result, he has increasing epiglottic inversion and airway protection. There is less oral and pharyngeal residue across the study, and there is less invasion into the airway. Boluses still spill over the epiglottis directly into the laryngeal vestibule before the swallow so penetration occurs that does not clear, but when he is positioned in a more reclined posture, this helps redirect the bolus more and he has no penetration with purees or honey thick liquids. He did still aspirate nectar thick liquids even in this position (PAS 7). Also noting improvements overall in terms of respiratory status and mentation, recommend starting Dys 2 (finely chopped) diet and honey thick liquids using a partially  reclined position for any intake.  DIGEST Swallow Severity Rating*  Safety: 2  Efficiency:1  Overall Pharyngeal Swallow Severity: 2 1: mild; 2: moderate; 3: severe; 4: profound  *The Dynamic Imaging Grade of Swallowing Toxicity is standardized for the head and neck cancer population, however, demonstrates promising clinical applications across populations to standardize the clinical rating of pharyngeal swallow safety and severity.  Factors that may increase risk of adverse event in presence of aspiration Rubye Oaks & Clearance Coots 2021): Reduced cognitive function;Limited mobility;Frail or deconditioned;Dependence for feeding and/or oral hygiene;Weak cough;Aspiration of thick, dense, and/or acidic materials  Swallow Evaluation Recommendations Recommendations: PO diet PO Diet Recommendation: Dysphagia 2 (Finely chopped);Moderately thick liquids (Level 3, honey thick) Liquid Administration via: Cup Medication Administration: Crushed with puree Supervision: Staff to assist with self-feeding;Full supervision/cueing for swallowing strategies Swallowing strategies  : Minimize environmental distractions;Slow rate;Small bites/sips;Multiple dry swallows after each bite/sip (swallow 2x if able) Postural changes: Partially reclined for meals Oral care recommendations: Oral care BID (2x/day) Caregiver Recommendations: Have oral suction available;Remove water pitcher;Avoid jello, ice cream, thin soups, popsicles      Mahala Menghini., M.A. CCC-SLP Acute Rehabilitation Services Office [phone]  Secure chat preferred  01/11/2024,10:26 AM

## 2024-01-11 NOTE — Progress Notes (Signed)
 Physical Therapy Treatment Patient Details Name: John [name] MRN: [id number] DOB: December 08, 1934 Today's Date: 01/11/2024   History of Present Illness Pt is 89 year old presented to [facility] on  01/05/24 for sudden onset of rt sided weakness. Pt found to have lt basal ganglia ICH. PMH - Lt hip fx, cabg, paroxysmal atrial fibrillation, dm, hypertension, OSA, chronic diastolic heart failure    PT Comments  Pt was assisted with self care/peri care this session performing bed mobility at CGA. Pt was fatigued after pericare and Min A for bed mobility and sit to stand with RW. Pt was able to transfer from EOB to recliner at Min A but unable to progress gait. Due to pt current functional status, home set up and available assistance at home recommending skilled physical therapy services < 3 hours/day in order to address strength, balance and functional mobility to decrease risk for falls, injury, immobility, skin break down and re-hospitalization.      If plan is discharge home, recommend the following: A little help with walking and/or transfers;A little help with bathing/dressing/bathroom;Direct supervision/assist for medications management;Assist for transportation   Can travel by private vehicle     Yes  Equipment Recommendations  None recommended by PT       Precautions / Restrictions Precautions Precautions: Fall;Other (comment) Precaution/Restrictions Comments: Goal SBP < 160 Restrictions Weight Bearing Restrictions Per Provider Order: No     Mobility  Bed Mobility Overal bed mobility: Needs Assistance Bed Mobility: Supine to Sit, Rolling Rolling: Contact guard assist   Supine to sit: Min assist     General bed mobility comments: Pt was able to progress bil LE to EOB and push up to sit with Min A and 1x verbal cues for sequencing with which arm to push up from bed to mid line.    Transfers Overall transfer level: Needs assistance Equipment used: Rolling walker (2  wheels) Transfers: Sit to/from Stand, Bed to chair/wheelchair/BSC Sit to Stand: Min assist   Step pivot transfers: Min assist       General transfer comment: Assist to power up and for balance. very short shuffling steps with Min A throughout for balance. pt fatigued easily    Ambulation/Gait   Pre-gait activities: Shuffling steps from EOB to recliner. Pt fatigued easily.          Balance Overall balance assessment: Needs assistance Sitting-balance support: No upper extremity supported, Feet supported Sitting balance-Leahy Scale: Fair     Standing balance support: Reliant on assistive device for balance, During functional activity, Bilateral upper extremity supported Standing balance-Leahy Scale: Poor Standing balance comment: no overt LOB, Min a for balance while stepping due to instability with RW        Communication Communication Communication: Impaired Factors Affecting Communication: Difficulty expressing self;Reduced clarity of speech  Cognition Arousal: Alert Behavior During Therapy: Flat affect   PT - Cognitive impairments: Safety/Judgement, Difficult to assess Difficult to assess due to: Impaired communication       PT - Cognition Comments: slurred speech, very difficult to understand. Follows basic commands Following commands: Impaired Following commands impaired: Only follows one step commands consistently    Cueing Cueing Techniques: Verbal cues, Tactile cues     General Comments General comments (skin integrity, edema, etc.): Pt denied dizziness with asked this session. O2 was kept on at 2 L throughout session      Pertinent Vitals/Pain Pain Assessment Pain Assessment: No/denies pain Pain Intervention(s): Monitored during session     PT Goals (current goals  can now be found in the care plan section) Acute Rehab PT Goals Patient Stated Goal: not stated PT Goal Formulation: With patient Time For Goal Achievement: 01/21/24 Potential to  Achieve Goals: Good Progress towards PT goals: Progressing toward goals    Frequency    Min 2X/week      PT Plan  Continue with current POC        AM-PAC PT "6 Clicks" Mobility   Outcome Measure  Help needed turning from your back to your side while in a flat bed without using bedrails?: A Little Help needed moving from lying on your back to sitting on the side of a flat bed without using bedrails?: A Little Help needed moving to and from a bed to a chair (including a wheelchair)?: A Little Help needed standing up from a chair using your arms (e.g., wheelchair or bedside chair)?: A Little Help needed to walk in hospital room?: A Lot Help needed climbing 3-5 steps with a railing? : Total 6 Click Score: 15    End of Session Equipment Utilized During Treatment: Gait belt Activity Tolerance: Patient tolerated treatment well Patient left: in chair;with call bell/phone within reach;with chair alarm set Nurse Communication: Mobility status PT Visit Diagnosis: Unsteadiness on feet (R26.81);Other abnormalities of gait and mobility (R26.89);Muscle weakness (generalized) (M62.81);History of falling (Z91.81)     Time: 1610-9604 PT Time Calculation (min) (ACUTE ONLY): 27 min  Charges:    $Therapeutic Activity: 23-37 mins PT General Charges $$ ACUTE PT VISIT: 1 Visit                    Harrel Carina, DPT, CLT  Acute Rehabilitation Services Office: [phone] (Secure chat preferred)    Claudia Desanctis 01/11/2024, 12:39 PM

## 2024-01-11 NOTE — Progress Notes (Signed)
 PROGRESS NOTE  John [name] QMV:[id number] DOB: 1934-12-14   PCP: [name], MD  Patient is from: [facility]  DOA: 01/05/2024 LOS: 6  Chief complaints Chief Complaint  Patient presents with   Code Stroke     Brief Narrative / Interim history: 89 year old M with PMH of CAD/CABG in 1996, PAF not on AC due to POTS/recurrent fall, ICH, HFpEF, prediabetes, CKD-3A, PUD s/p gastrectomy and vagotomy, HTN and anxiety presented to ED with right-sided weakness and dysarthria after found down at ALF.  In ED, he was hypokalemic to 2.6.  WBC 14.6.  CXR with cardiomegaly, possible central venous congestion, left base and right middle opacity.  CT head raise concern for 3.8 x 1.5 x 3.1 cm (9 mm) acute parenchymal hemorrhage centered within the left basal ganglia with surrounding edema and chronic encephalomalacia/gliosis within the right superior frontal gyrus from prior intraparenchymal hemorrhage.  CT angio head and neck negative for LVO, aneurysm or AVM but patchy and asymmetric dependent opacity in visible right lung.  MRI brain and repeat CT head the next day without progression of acute ICH on 3/14.[name]   Neurology recommended consideration of Watchman device or ASPIR trial outpatient.  SLP recommended dysphagia 2 diet.  Therapy recommended SNF.  Subjective: Seen and examined earlier this morning.  No major events overnight of this morning.  Reports feeling better overall.  No specific complaints.  He denies focal weakness, numbness or tingling.  Speech clear.  Working with therapy.  Objective: Vitals:   01/11/24 0347 01/11/24 0744 01/11/24 1117 01/11/24 1225  BP: (!) 140/62 136/60 (!) 162/76 (!) 149/65  Pulse: 61 73 74   Resp: 20 19 17    Temp: 97.8 F (36.6 C) 98.1 F (36.7 C) (!) 97.4 F (36.3 C)   TempSrc: Oral Oral Oral   SpO2: 94% 90% 94%   Weight:      Height:        Examination:  GENERAL: No apparent distress.  Nontoxic. HEENT: MMM.  Vision and hearing grossly  intact.  NECK: Supple.  No apparent JVD.  RESP:  No IWOB.  Fair aeration bilaterally. CVS:  RRR. Heart sounds normal.  ABD/GI/GU: BS+. Abd soft, NTND.  MSK/EXT:  Moves extremities. No apparent deformity. No edema.  SKIN: no apparent skin lesion or wound NEURO: Awake, alert and oriented times except date.  Speech clear. Cranial nerves II-XII intact except for subtle right facial droop when relaxed. Motor 5/5 in all muscle groups of UE and LE bilaterally, Normal tone. Light sensation intact in all dermatomes of upper and lower ext bilaterally. Patellar reflex symmetric.  No pronator drift.  PSYCH: Calm. Normal affect.   Consultants:  Neurology  Procedures: None  Microbiology summarized: None  Assessment and plan: Intracranial hemorrhage: Presents with right-sided weakness and right facial droop after found down at ALF.  CT and MRI showed left basal ganglia ICH measuring 3.8 x 1.5 x 3.1 cm (9 mm).  Repeat CT head 24 hours later with stable ICH.  Patient is not on anticoagulation or antiplatelets.  TTE without significant finding other than moderately dilated LAE.  LDL 121.  A1c 5.2%. -Neurology signed off -SLP recommended dysphagia 2 diet -Therapy recommended SNF  Possible aspiration pneumonia: X-ray and CT finding as above.  No respiratory distress.  Had leukocytosis.  Started on IV Unasyn. -Continue IV Unasyn until 3/20 -Aspiration precaution -Manage dysphagia as below  Dysphagia  -On dysphagia 2 diet per SLP.   -Aspiration precaution   Paroxysmal A-fib: CHA2DS2-VASc  score > 4 but patient with POTS and recurrent intracranial hemorrhage. -Neurology suggested consideration of Watchman device or aspire trial outpatient -Defer to patient's cardiologist.  History of CAD/CABG in 1996: No cardiopulmonary symptoms.  Not on meds.  Chronic HFpEF: TTE with LVEF of 60 to 65%, G1 DD, moderate AR and moderate LAE.  Not on diuretics at home.  Appears euvolemic.   POTS syndrome, orthostatic  hypotension: On midodrine -Discontinue clonidine patch. -Hold Florinef given hyponatremia and hypokalemia -Continue holding midodrine  Essential hypertension?  SBP slightly elevated. -Discontinue clonidine patch now he can take p.o. -Continue Toprol-XL. -Start low-dose amlodipine 5 mg daily -P.o. hydralazine as needed   Mild hypokalemia -Monitor replenish as appropriate  Mild hypernatremia: -Encourage oral hydration -Hold Florinef   BPH -Continue Flomax  Hyperglycemia/prediabetes: Never had A1c over 5.9% as far as 2009. -Discontinue SSI and CBG monitoring  CKD-3A: Stable -Monitor intermittently     Body mass index is 24.7 kg/m.          DVT prophylaxis:  enoxaparin (LOVENOX) injection 40 mg Start: 01/06/24 1130 SCD's Start: 01/05/24 1310  Code Status: DNR-Limited Family Communication: None at bedside Level of care: Telemetry Medical Status is: Inpatient Remains inpatient appropriate because: Acute CVA/ICH, dysphagia   Final disposition: SNF   55 minutes with more than 50% spent in reviewing records, counseling patient/family and coordinating care.   Sch Meds:  Scheduled Meds:  amLODipine  5 mg Oral Daily   enoxaparin (LOVENOX) injection  40 mg Subcutaneous Q24H   metoprolol succinate  12.5 mg Oral Daily   potassium chloride  40 mEq Oral Once   tamsulosin  0.4 mg Oral QPC supper   Continuous Infusions:  ampicillin-sulbactam (UNASYN) IV 3 g (01/11/24 1002)   PRN Meds:.acetaminophen **OR** acetaminophen (TYLENOL) oral liquid 160 mg/5 mL **OR** acetaminophen, hydrALAZINE, labetalol, LORazepam  Antimicrobials: Anti-infectives (From admission, onward)    Start     Dose/Rate Route Frequency Ordered Stop   01/08/24 1000  Ampicillin-Sulbactam (UNASYN) 3 g in sodium chloride 0.9 % 100 mL IVPB        3 g 200 mL/hr over 30 Minutes Intravenous Every 6 hours 01/08/24 0843          I have personally reviewed the following labs and images: CBC: Recent  Labs  Lab 01/05/24 1247 01/05/24 1251 01/07/24 0321 01/08/24 0630 01/09/24 0503 01/10/24 0504 01/11/24 0502  WBC 4.6  --  11.6* 13.5* 11.1* 7.9 6.5  NEUTROABS 2.5  --   --   --   --   --   --   HGB 12.8*   < > 12.3* 12.5* 12.4* 12.2* 13.2  HCT 36.9*   < > 36.4* 37.0* 37.4* 36.7* 39.2  MCV 97.9  --  100.6* 101.4* 101.9* 102.5* 101.8*  PLT 227  --  209 210 193 189 216   < > = values in this interval not displayed.   BMP &GFR Recent Labs  Lab 01/05/24 1247 01/05/24 1251 01/07/24 0321 01/08/24 0630 01/09/24 0503 01/10/24 0504 01/11/24 0502  NA 142   < > 142 144 143 148* 146*  K 2.6*   < > 3.3* 3.2* 3.1* 3.3* 3.3*  CL 104   < > 106 108 108 110 107  CO2 29   < > 24 24 24 25 25   GLUCOSE 125*   < > 136* 130* 127* 125* 124*  BUN 14   < > 14 19 17 16 15   CREATININE 1.30*   < > 1.23 1.32*  1.37* 1.27* 1.21  CALCIUM 8.4*   < > 8.5* 8.8* 8.3* 8.3* 8.4*  MG 2.0  --  2.1  --  2.0  --   --    < > = values in this interval not displayed.   Estimated Creatinine Clearance: 47.2 mL/min (by C-G formula based on SCr of 1.21 mg/dL). Liver & Pancreas: Recent Labs  Lab 01/05/24 1247  AST 18  ALT 10  ALKPHOS 56  BILITOT 0.8  PROT 6.0*  ALBUMIN 3.2*   No results for input(s): "LIPASE", "AMYLASE" in the last 168 hours. No results for input(s): "AMMONIA" in the last 168 hours. Diabetic: No results for input(s): "HGBA1C" in the last 72 hours. Recent Labs  Lab 01/10/24 1154 01/10/24 1623 01/10/24 2129 01/11/24 0618 01/11/24 1125  GLUCAP 122* 124* 125* 126* 190*   Cardiac Enzymes: Recent Labs  Lab 01/05/24 1247  CKTOTAL 117   No results for input(s): "PROBNP" in the last 8760 hours. Coagulation Profile: Recent Labs  Lab 01/05/24 1247  INR 1.1   Thyroid Function Tests: No results for input(s): "TSH", "T4TOTAL", "FREET4", "T3FREE", "THYROIDAB" in the last 72 hours. Lipid Profile: No results for input(s): "CHOL", "HDL", "LDLCALC", "TRIG", "CHOLHDL", "LDLDIRECT" in the last  72 hours. Anemia Panel: No results for input(s): "VITAMINB12", "FOLATE", "FERRITIN", "TIBC", "IRON", "RETICCTPCT" in the last 72 hours. Urine analysis:    Component Value Date/Time   COLORURINE YELLOW 12/07/2022 1210   APPEARANCEUR CLEAR 12/07/2022 1210   LABSPEC 1.024 12/07/2022 1210   PHURINE 5.0 12/07/2022 1210   GLUCOSEU NEGATIVE 12/07/2022 1210   HGBUR NEGATIVE 12/07/2022 1210   BILIRUBINUR NEGATIVE 12/07/2022 1210   KETONESUR 5 (A) 12/07/2022 1210   PROTEINUR 30 (A) 12/07/2022 1210   UROBILINOGEN 0.2 09/23/2014 1415   NITRITE NEGATIVE 12/07/2022 1210   LEUKOCYTESUR NEGATIVE 12/07/2022 1210   Sepsis Labs: Invalid input(s): "PROCALCITONIN", "LACTICIDVEN"  Microbiology: No results found for this or any previous visit (from the past 240 hours).  Radiology Studies: DG Swallowing Func-Speech Pathology Result Date: 01/11/2024 Table formatting from the original result was not included. Modified Barium Swallow Study Patient Details Name: John [name] MRN: [id number] Date of Birth: 01-Feb-1935 Today's Date: 01/11/2024 HPI/PMH: HPI: John [name] is a 89 y.o. male with hx of A-fib not on anticoagulation, CAD, CKD stage II, DM, hyperlipidemia who presents with right-sided weakness that started abruptly.  He was in his normal state of health when he woke up this morning, but got up to go to the bathroom around 11 AM and when he did so, he suddenly developed right-sided weakness that caused him to fall.  He does not think that he hit his head.  He was found to have left basal ganglia intraparenchymal hematoma.  Patient initially passed the [facility] Screen on 01/05/2024 but began to have issues swallowing this date. Clinical Impression: Clinical Impression: Pt has made mild improvements in oropharyngeal function, still also impacted by posture and positioning as well. He has improved lingual control and posterior propulsion, with transit slow at times but not repetitive/disorganized. He  also has some improvements pharyngeally with base of tongue retraction and laryngeal elevation. As a result, he has increasing epiglottic inversion and airway protection. There is less oral and pharyngeal residue across the study, and there is less invasion into the airway. Boluses still spill over the epiglottis directly into the laryngeal vestibule before the swallow so penetration occurs that does not clear, but when he is positioned in a more reclined posture, this helps  redirect the bolus more and he has no penetration with purees or honey thick liquids. He did still aspirate nectar thick liquids even in this position (PAS 7). Also noting improvements overall in terms of respiratory status and mentation, recommend starting Dys 2 (finely chopped) diet and honey thick liquids using a partially reclined position for any intake. DIGEST Swallow Severity Rating*             Safety: 2             Efficiency:1             Overall Pharyngeal Swallow Severity: 2 1: mild; 2: moderate; 3: severe; 4: profound *The Dynamic Imaging Grade of Swallowing Toxicity is standardized for the head and neck cancer population, however, demonstrates promising clinical applications across populations to standardize the clinical rating of pharyngeal swallow safety and severity. Factors that may increase risk of adverse event in presence of aspiration Rubye Oaks & Clearance Coots 2021): Factors that may increase risk of adverse event in presence of aspiration Rubye Oaks & Clearance Coots 2021): Reduced cognitive function; Limited mobility; Frail or deconditioned; Dependence for feeding and/or oral hygiene; Weak cough; Aspiration of thick, dense, and/or acidic materials Recommendations/Plan: Swallowing Evaluation Recommendations Swallowing Evaluation Recommendations Recommendations: PO diet PO Diet Recommendation: Dysphagia 2 (Finely chopped); Moderately thick liquids (Level 3, honey thick) Liquid Administration via: Cup Medication Administration: Crushed with  puree Supervision: Staff to assist with self-feeding; Full supervision/cueing for swallowing strategies Swallowing strategies  : Minimize environmental distractions; Slow rate; Small bites/sips; Multiple dry swallows after each bite/sip (swallow 2x if able) Postural changes: Partially reclined for meals Oral care recommendations: Oral care BID (2x/day) Caregiver Recommendations: Have oral suction available; Remove water pitcher; Avoid jello, ice cream, thin soups, popsicles Treatment Plan Treatment Plan Treatment recommendations: Therapy as outlined in treatment plan below Follow-up recommendations: Skilled nursing-short term rehab (<3 hours/day) Functional status assessment: Patient has had a recent decline in their functional status and demonstrates the ability to make significant improvements in function in a reasonable and predictable amount of time. Treatment frequency: Min 2x/week Treatment duration: 2 weeks Interventions: Aspiration precaution training; Compensatory techniques; Patient/family education; Trials of upgraded texture/liquids; Diet toleration management by SLP; Respiratory muscle strength training Recommendations Recommendations for follow up therapy are one component of a multi-disciplinary discharge planning process, led by the attending physician.  Recommendations may be updated based on patient status, additional functional criteria and insurance authorization. Assessment: Orofacial Exam: Orofacial Exam Oral Cavity: Oral Hygiene: WFL Oral Cavity - Dentition: Adequate natural dentition Orofacial Anatomy: WFL Anatomy: Anatomy: Presence of cervical hardware Boluses Administered: Boluses Administered Boluses Administered: Thin liquids (Level 0); Mildly thick liquids (Level 2, nectar thick); Moderately thick liquids (Level 3, honey thick); Puree; Solid  Oral Impairment Domain: Oral Impairment Domain Lip Closure: No labial escape Tongue control during bolus hold: Cohesive bolus between tongue to  palatal seal Bolus preparation/mastication: Slow prolonged chewing/mashing with complete recollection Bolus transport/lingual motion: Slow tongue motion Oral residue: Trace residue lining oral structures Location of oral residue : Tongue; Palate Initiation of pharyngeal swallow : Pyriform sinuses  Pharyngeal Impairment Domain: Pharyngeal Impairment Domain Soft palate elevation: No bolus between soft palate (SP)/pharyngeal wall (PW) Laryngeal elevation: Partial superior movement of thyroid cartilage/partial approximation of arytenoids to epiglottic petiole Anterior hyoid excursion: Partial anterior movement Epiglottic movement: Complete inversion Laryngeal vestibule closure: Incomplete, narrow column air/contrast in laryngeal vestibule Pharyngeal stripping wave : Present - diminished Pharyngeal contraction (A/P view only): N/A Pharyngoesophageal segment opening: Partial distention/partial duration, partial obstruction of  flow Tongue base retraction: Trace column of contrast or air between tongue base and PPW Pharyngeal residue: Collection of residue within or on pharyngeal structures Location of pharyngeal residue: Valleculae; Pyriform sinuses  Esophageal Impairment Domain: No data recorded Pill: No data recorded Penetration/Aspiration Scale Score: Penetration/Aspiration Scale Score 1.  Material does not enter airway: Puree; Solid 3.  Material enters airway, remains ABOVE vocal cords and not ejected out: Thin liquids (Level 0); Moderately thick liquids (Level 3, honey thick) 7.  Material enters airway, passes BELOW cords and not ejected out despite cough attempt by patient: Mildly thick liquids (Level 2, nectar thick) Compensatory Strategies: Compensatory Strategies Straw: Ineffective Ineffective Straw: Moderately thick liquid (Level 3, honey thick) Multiple swallows: Ineffective Ineffective Multiple Swallows: Mildly thick liquid (Level 2, nectar thick) Reclining posture: Effective Effective Reclining Posture:  Moderately thick liquid (Level 3, honey thick); Puree   General Information: Caregiver present: No  Diet Prior to this Study: NPO   Temperature : Normal   Respiratory Status: WFL   Supplemental O2: Nasal cannula   History of Recent Intubation: No  Behavior/Cognition: Alert; Cooperative; Requires cueing Self-Feeding Abilities: Needs assist with self-feeding Baseline vocal quality/speech: Hypophonia/low volume Volitional Cough: Able to elicit Volitional Swallow: Able to elicit (inconsistently) Exam Limitations: No limitations Goal Planning: Prognosis for improved oropharyngeal function: Good Barriers to Reach Goals: Cognitive deficits No data recorded Patient/Family Stated Goal: wants food Consulted and agree with results and recommendations: Patient; Nurse Pain: Pain Assessment Pain Assessment: Faces Faces Pain Scale: 0 End of Session: Start Time:SLP Start Time (ACUTE ONLY): 0856 Stop Time: SLP Stop Time (ACUTE ONLY): 0917 Time Calculation:SLP Time Calculation (min) (ACUTE ONLY): 21 min Charges: SLP Evaluations $ SLP Speech Visit: 1 Visit SLP Evaluations $MBS Swallow: 1 Procedure $Swallowing Treatment: 1 Procedure SLP visit diagnosis: SLP Visit Diagnosis: Dysphagia, oropharyngeal phase (R13.12) Past Medical History: Past Medical History: Diagnosis Date  Anxiety   Arthritis   "lower back; left ankle" (03/20/2014)  ASTHMA   Blood transfusion   "related to OHS, ulcers"  CAD (coronary artery disease)   a. s/p 2V CABG 1996. b. Last cath 12/2016 -> patent grafts, med rx.  CAD, ARTERY BYPASS GRAFT   Cataract   CKD (chronic kidney disease), stage II   Colon polyps   adenomatous polyps  COPD   Depression   Diabetes mellitus without complication (HCC)   no meds now  Diverticulosis   Duodenal ulcer without hemorrhage or perforation 12/29/2011  GASTROESOPHAGEAL REFLUX DISEASE   HYPERLIPIDEMIA   Insomnia   Leukodystrophy (HCC)   Memory loss   Nephrolithiasis   "I've had over 320 kidney stones" (03/20/2014)  NEPHROLITHIASIS, HX OF  03/12/2009  Parkinsonism (HCC)   Sleep apnea   does not use CPAP  Stroke (HCC)   TIA  SUPRAVENTRICULAR TACHYCARDIA, HX OF   SVT (supraventricular tachycardia) (HCC) 03/12/2009  TIA   Ulcer  Past Surgical History: Past Surgical History: Procedure Laterality Date  ANKLE FRACTURE SURGERY Left ~ 2012  CARDIAC CATHETERIZATION    COLONOSCOPY    CORONARY ARTERY BYPASS GRAFT  1996  CABG X2  CYSTOSCOPY WITH URETEROSCOPY, STONE BASKETRY AND STENT PLACEMENT    Cystourethroscopy with stent removal.    EXCISIONAL HEMORRHOIDECTOMY    GASTRECTOMY    HIATAL HERNIA REPAIR    IRRIGATION AND DEBRIDEMENT SEBACEOUS CYST    "off my back  KNEE ARTHROSCOPY WITH MEDIAL MENISECTOMY Left 08/24/2019  Procedure: LEFT KNEE ARTHROSCOPY WITH PARTIAL MEDIAL MENISCECTOMY;  Surgeon: Tarry Kos, MD;  Location: [facility];  Service: Orthopedics;  Laterality: Left;  LEFT HEART CATH AND CORS/GRAFTS ANGIOGRAPHY N/A 12/23/2016  Procedure: Left Heart Cath and Cors/Grafts Angiography;  Surgeon: Kathleene Hazel, MD;  Location: [facility] INVASIVE CV LAB;  Service: Cardiovascular;  Laterality: N/A;  POLYPECTOMY    PROSTATE SURGERY    "took the center of my prostate out"  TOTAL HIP ARTHROPLASTY Left 12/03/2022  Procedure: LEFT TOTAL HIP ARTHROPLASTY ANTERIOR APPROACH;  Surgeon: Tarry Kos, MD;  Location: [facility] OR;  Service: Orthopedics;  Laterality: Left;  UPPER GASTROINTESTINAL ENDOSCOPY    VAGOTOMY   Mahala Menghini., M.A. CCC-SLP Acute Rehabilitation Services Office [phone] Secure chat preferred 01/11/2024, 10:27 AM     Tarahji Ramthun T. Jermaine Neuharth [facility]  If 7PM-7AM, please contact night-coverage [email or web address] 01/11/2024, 3:12 PM

## 2024-01-11 NOTE — TOC Progression Note (Cosign Needed Addendum)
 Transition of Care [facility]) - Progression Note    Patient Details  Name: John [name] MRN: [id number] Date of Birth: 1935-07-09  Transition of [facility] CM/SW Contact  Tahjay Binion Felipa [place], Student-Social Work Phone Number: 01/11/2024, 2:04 PM  Clinical Narrative:   MSW Student spoke to daughter [name] over the phone. MSW Student told [name] about SNF recommendation from OT and PT. [name] indicated that she wants to choose [facility]. MSW Student educated [place] about the referral process. MSW Student reached out to [facility] about accepting patient. Awaiting response.   UPDATE: [facility] has accepted patient and has bed availability. MSW Student called [employer] to start insurance authorization. Awaiting decision       Expected Discharge Plan: Skilled Nursing Facility Barriers to Discharge: Continued Medical Work up, English as a second language teacher  Expected Discharge Plan and Services       Living arrangements for the past 2 months: Assisted Living Facility                                       Social Determinants of Health (SDOH) Interventions SDOH Screenings   Food Insecurity: No Food Insecurity (01/05/2024)  Housing: Low Risk  (01/05/2024)  Transportation Needs: No Transportation Needs (01/05/2024)  Utilities: Not At Risk (01/05/2024)  Depression (PHQ2-9): Low Risk  (06/30/2021)  Social Connections: Unknown (01/05/2024)  Tobacco Use: Medium Risk (01/05/2024)    Readmission Risk Interventions     No data to display

## 2024-01-12 DIAGNOSIS — I610 Nontraumatic intracerebral hemorrhage in hemisphere, subcortical: Secondary | ICD-10-CM | POA: Diagnosis not present

## 2024-01-12 LAB — GLUCOSE, CAPILLARY
Glucose-Capillary: 118 mg/dL — ABNORMAL HIGH (ref 70–99)
Glucose-Capillary: 110 mg/dL — ABNORMAL HIGH (ref 70–99)
Glucose-Capillary: 137 mg/dL — ABNORMAL HIGH (ref 70–99)
Glucose-Capillary: 123 mg/dL — ABNORMAL HIGH (ref 70–99)

## 2024-01-12 LAB — CBC
HCT: 37.4 % — ABNORMAL LOW (ref 39.0–52.0)
Hemoglobin: 12.5 g/dL — ABNORMAL LOW (ref 13.0–17.0)
MCH: 33.8 pg (ref 26.0–34.0)
MCHC: 33.4 g/dL (ref 30.0–36.0)
MCV: 101.1 fL — ABNORMAL HIGH (ref 80.0–100.0)
Platelets: 230 10*3/uL (ref 150–400)
RBC: 3.7 MIL/uL — ABNORMAL LOW (ref 4.22–5.81)
RDW: 13.4 % (ref 11.5–15.5)
WBC: 5.5 10*3/uL (ref 4.0–10.5)
nRBC: 0 % (ref 0.0–0.2)

## 2024-01-12 LAB — RENAL FUNCTION PANEL
Albumin: 2.5 g/dL — ABNORMAL LOW (ref 3.5–5.0)
Albumin: 2.4 g/dL — ABNORMAL LOW (ref 3.5–5.0)
Anion gap: 11 (ref 5–15)
Anion gap: 10 (ref 5–15)
BUN: 18 mg/dL (ref 8–23)
BUN: 18 mg/dL (ref 8–23)
CO2: 26 mmol/L (ref 22–32)
CO2: 24 mmol/L (ref 22–32)
Calcium: 8.1 mg/dL — ABNORMAL LOW (ref 8.9–10.3)
Calcium: 8.2 mg/dL — ABNORMAL LOW (ref 8.9–10.3)
Chloride: 110 mmol/L (ref 98–111)
Chloride: 111 mmol/L (ref 98–111)
Creatinine, Ser: 1.35 mg/dL — ABNORMAL HIGH (ref 0.61–1.24)
Creatinine, Ser: 1.36 mg/dL — ABNORMAL HIGH (ref 0.61–1.24)
GFR, Estimated: 51 mL/min — ABNORMAL LOW (ref >60)
GFR, Estimated: 50 mL/min — ABNORMAL LOW (ref >60)
Glucose, Bld: 115 mg/dL — ABNORMAL HIGH (ref 70–99)
Glucose, Bld: 178 mg/dL — ABNORMAL HIGH (ref 70–99)
Phosphorus: 3.8 mg/dL (ref 2.5–4.6)
Phosphorus: 3.4 mg/dL (ref 2.5–4.6)
Potassium: 2.8 mmol/L — ABNORMAL LOW (ref 3.5–5.1)
Potassium: 3 mmol/L — ABNORMAL LOW (ref 3.5–5.1)
Sodium: 147 mmol/L — ABNORMAL HIGH (ref 135–145)
Sodium: 145 mmol/L (ref 135–145)

## 2024-01-12 LAB — MAGNESIUM: Magnesium: 2 mg/dL (ref 1.7–2.4)

## 2024-01-12 MED ORDER — POTASSIUM CHLORIDE 20 MEQ PO PACK
40.0000 meq | PACK | ORAL | Status: AC
  Administered 2024-01-12 (×2): 40 meq via ORAL
  Filled 2024-01-12 (×2): qty 2

## 2024-01-12 MED ORDER — AMLODIPINE BESYLATE 5 MG PO TABS
10.0000 mg | ORAL_TABLET | Freq: Every day | ORAL | Status: DC
  Administered 2024-01-12 – 2024-01-13 (×2): 10 mg via ORAL
  Filled 2024-01-12: qty 2
  Filled 2024-01-12: qty 4

## 2024-01-12 MED ORDER — AMOXICILLIN-POT CLAVULANATE 875-125 MG PO TABS
1.0000 | ORAL_TABLET | Freq: Two times a day (BID) | ORAL | Status: DC
  Administered 2024-01-12 – 2024-01-13 (×3): 1 via ORAL
  Filled 2024-01-12 (×3): qty 1

## 2024-01-12 MED ORDER — POTASSIUM CHLORIDE 20 MEQ PO PACK
40.0000 meq | PACK | ORAL | Status: AC
Start: 2024-01-12 — End: 2024-01-12
  Administered 2024-01-12 (×2): 40 meq via ORAL
  Filled 2024-01-12 (×2): qty 2

## 2024-01-12 MED ORDER — GUAIFENESIN 100 MG/5ML PO LIQD
5.0000 mL | ORAL | Status: DC | PRN
  Administered 2024-01-12 – 2024-01-13 (×3): 5 mL via ORAL
  Filled 2024-01-12 (×3): qty 5

## 2024-01-12 MED ORDER — IPRATROPIUM-ALBUTEROL 0.5-2.5 (3) MG/3ML IN SOLN
3.0000 mL | Freq: Four times a day (QID) | RESPIRATORY_TRACT | Status: DC | PRN
  Administered 2024-01-12 – 2024-01-13 (×2): 3 mL via RESPIRATORY_TRACT
  Filled 2024-01-12 (×2): qty 3

## 2024-01-12 NOTE — Progress Notes (Signed)
 Physical Therapy Treatment Patient Details Name: John [name] MRN: [id number] DOB: 03-09-1935 Today's Date: 01/12/2024   History of Present Illness Pt is 89 year old presented to [facility] on  01/05/24 for sudden onset of rt sided weakness. Pt found to have lt basal ganglia ICH. PMH - Lt hip fx, cabg, paroxysmal atrial fibrillation, dm, hypertension, OSA, chronic diastolic heart failure    PT Comments  Slow progress towards goals. Pt continues at Min A for bed mobility, sit to stand and transfers using RW. Pt continues to fatigue quickly and is limited by his bowels; in standing pt was incontinent and assisted with clean up. Pt then was very fatigued and needed to sit. Due to pt current functional status, home set up and available assistance at home recommending skilled physical therapy services < 3 hours/day in order to address strength, balance and functional mobility to decrease risk for falls, injury, immobility, skin break down and re-hospitalization.     If plan is discharge home, recommend the following: A little help with walking and/or transfers;Direct supervision/assist for medications management;Assist for transportation   Can travel by private vehicle     No  Equipment Recommendations  None recommended by PT       Precautions / Restrictions Precautions Precautions: Fall;Other (comment) Precaution/Restrictions Comments: Goal SBP < 160 Restrictions Weight Bearing Restrictions Per Provider Order: No     Mobility  Bed Mobility Overal bed mobility: Needs Assistance Bed Mobility: Supine to Sit Rolling: Contact guard assist   Supine to sit: Min assist     General bed mobility comments: min A with verbal cues for sequencing to get trunk to mid line    Transfers Overall transfer level: Needs assistance Equipment used: Rolling walker (2 wheels) Transfers: Sit to/from Stand Sit to Stand: Min assist, Contact guard assist   Step pivot transfers: Min assist       General  transfer comment: STS min A  from EOB, cues for hand placement using bed instead of one UE on RW.    Ambulation/Gait   Pre-gait activities: Improved stepping this session from EOB to recliner with improved floor clearance. Pt continues to fatigue easily and is limited by bowels.    \\     Balance Overall balance assessment: Needs assistance Sitting-balance support: No upper extremity supported, Feet supported Sitting balance-Leahy Scale: Fair Sitting balance - Comments: CGA Postural control: Posterior lean Standing balance support: Reliant on assistive device for balance, During functional activity, Bilateral upper extremity supported Standing balance-Leahy Scale: Poor Standing balance comment: CGA static standing with RW  \\    Communication Communication Communication: Impaired Factors Affecting Communication: Difficulty expressing self;Reduced clarity of speech  Cognition Arousal: Alert Behavior During Therapy: Flat affect   PT - Cognitive impairments: Safety/Judgement, Difficult to assess Difficult to assess due to: Impaired communication     PT - Cognition Comments: slurred speech, very difficult to understand. Follows basic commands Following commands: Impaired Following commands impaired: Only follows one step commands consistently    Cueing Cueing Techniques: Verbal cues, Tactile cues     General Comments General comments (skin integrity, edema, etc.): Pt on room air with O2 stas at 92%      Pertinent Vitals/Pain Pain Assessment Pain Assessment: Faces Faces Pain Scale: No hurt Pain Intervention(s): Monitored during session           PT Goals (current goals can now be found in the care plan section) Acute Rehab PT Goals Patient Stated Goal: not stated Time For Goal  Achievement: 01/21/24 Potential to Achieve Goals: Good Progress towards PT goals: Progressing toward goals    Frequency    Min 2X/week      PT Plan  Continue with current POC         AM-PAC PT "6 Clicks" Mobility   Outcome Measure  Help needed turning from your back to your side while in a flat bed without using bedrails?: A Little Help needed moving from lying on your back to sitting on the side of a flat bed without using bedrails?: A Little Help needed moving to and from a bed to a chair (including a wheelchair)?: A Little Help needed standing up from a chair using your arms (e.g., wheelchair or bedside chair)?: A Little Help needed to walk in hospital room?: A Lot Help needed climbing 3-5 steps with a railing? : Total 6 Click Score: 15    End of Session Equipment Utilized During Treatment: Gait belt Activity Tolerance: Patient tolerated treatment well Patient left: in chair;with call bell/phone within reach;with chair alarm set Nurse Communication: Mobility status;Other (comment) (pt may not be done with bowel movement) PT Visit Diagnosis: Unsteadiness on feet (R26.81);Other abnormalities of gait and mobility (R26.89);Muscle weakness (generalized) (M62.81);History of falling (Z91.81)     Time: 4098-1191 PT Time Calculation (min) (ACUTE ONLY): 32 min  Charges:    $Therapeutic Activity: 23-37 mins PT General Charges $$ ACUTE PT VISIT: 1 Visit                     Harrel Carina, DPT, CLT  Acute Rehabilitation Services Office: [phone] (Secure chat preferred)    Claudia Desanctis 01/12/2024, 3:47 PM

## 2024-01-12 NOTE — Progress Notes (Signed)
 PROGRESS NOTE  John [name] WUJ:[id number] DOB: Dec 17, 1934   PCP: [name], MD  Patient is from: [facility]  DOA: 01/05/2024 LOS: 7  Chief complaints Chief Complaint  Patient presents with   Code Stroke     Brief Narrative / Interim history: 89 year old M with PMH of CAD/CABG in 1996, PAF not on AC due to POTS/recurrent fall, ICH, HFpEF, prediabetes, CKD-3A, PUD s/p gastrectomy and vagotomy, HTN and anxiety presented to ED with right-sided weakness and dysarthria after found down at ALF.  In ED, he was hypokalemic to 2.6.  WBC 14.6.  CXR with cardiomegaly, possible central venous congestion, left base and right middle opacity.  CT head raise concern for 3.8 x 1.5 x 3.1 cm (9 mm) acute parenchymal hemorrhage centered within the left basal ganglia with surrounding edema and chronic encephalomalacia/gliosis within the right superior frontal gyrus from prior intraparenchymal hemorrhage.  CT angio head and neck negative for LVO, aneurysm or AVM but patchy and asymmetric dependent opacity in visible right lung.  MRI brain and repeat CT head the next day without progression of acute ICH on 3/14.[name]   Neurology recommended consideration of Watchman device or ASPIR trial outpatient.  SLP recommended dysphagia 2 diet.  Therapy recommended SNF.  Subjective: Seen and examined earlier this morning.  No major events overnight of this morning.  He is sleepy but wakes to voice.  He is not quite alert but oriented x 4.  Objective: Vitals:   01/12/24 0006 01/12/24 0407 01/12/24 0819 01/12/24 1200  BP: (!) 157/58 (!) 181/77 (!) 162/62 (!) 165/78  Pulse: 76 83 72 65  Resp:   18 17  Temp: 98.2 F (36.8 C) 97.7 F (36.5 C) 98 F (36.7 C) 98.6 F (37 C)  TempSrc: Oral Oral Oral Oral  SpO2: 95% 96% 91% 95%  Weight:      Height:        Examination:  GENERAL: No apparent distress.  Nontoxic. HEENT: MMM.  Vision and hearing grossly intact.  NECK: Supple.  No apparent JVD.  RESP:   No IWOB.  Fair aeration bilaterally. CVS:  RRR. Heart sounds normal.  ABD/GI/GU: BS+. Abd soft, NTND.  MSK/EXT:  Moves extremities. No apparent deformity. No edema.  SKIN: no apparent skin lesion or wound NEURO: Sleepy but wakes to voice.  Not alert but oriented x 4.  Has right facial droop.  Some dysarthria that has improved as he continued to speak PSYCH: Calm. Normal affect.   Consultants:  Neurology  Procedures: None  Microbiology summarized: None  Assessment and plan: Intracranial hemorrhage: Presents with right-sided weakness and right facial droop after found down at ALF.  CT and MRI showed left basal ganglia ICH measuring 3.8 x 1.5 x 3.1 cm (9 mm).  Repeat CT head 24 hours later with stable ICH.  Patient is not on anticoagulation or antiplatelets.  TTE without significant finding other than moderately dilated LAE.  LDL 121.  A1c 5.2%. -Neurology signed off -SLP recommended dysphagia 2 diet -Therapy recommended SNF  Possible aspiration pneumonia: X-ray and CT finding as above.  No respiratory distress.  Had leukocytosis.  -IV Unasyn 3/16>> p.o. Augmentin 3/20 -Aspiration precaution -Manage dysphagia as below  Dysphagia likely due to stroke. -On dysphagia 2 diet per SLP.   -Aspiration precaution   Paroxysmal A-fib: CHA2DS2-VASc score > 4 but patient with POTS and recurrent intracranial hemorrhage. -Neurology suggested consideration of Watchman device or aspire trial outpatient -Defer to patient's cardiologist.  History of CAD/CABG in  1996: No cardiopulmonary symptoms.  Not on meds.  Chronic HFpEF: TTE with LVEF of 60 to 65%, G1 DD, moderate AR and moderate LAE.  Not on diuretics at home.  Appears euvolemic. -Monitor.   POTS syndrome, orthostatic hypotension: On midodrine and Florinef at home.  Hypertensive here. -Hold Florinef given hypernatremia and hypokalemia -Continue holding midodrine and Florinef. -Increase amlodipine to 5 mg daily  Essential hypertension: BP  elevated here. -Discontinue clonidine patch now he can take p.o. -Increase amlodipine to 5 mg daily -Continue Toprol-XL. -Start low-dose amlodipine 5 mg daily -P.o. hydralazine as needed   Mild hypokalemia -Monitor replenish as appropriate -Recheck in the afternoon  Mild hypernatremia: -Change Unasyn to p.o. Augmentin -Continue holding Florinef   BPH -Continue Flomax  Hyperglycemia/prediabetes: Never had A1c over 5.9% as far as 2009. -Discontinue SSI and CBG monitoring  CKD-3A: Stable -Monitor intermittently     Body mass index is 24.7 kg/m.          DVT prophylaxis:  enoxaparin (LOVENOX) injection 40 mg Start: 01/06/24 1130 SCD's Start: 01/05/24 1310  Code Status: DNR-Limited Family Communication: None at bedside Level of care: Telemetry Medical Status is: Inpatient Remains inpatient appropriate because: Acute CVA/ICH, dysphagia, hypernatremia and hypokalemia   Final disposition: SNF   55 minutes with more than 50% spent in reviewing records, counseling patient/family and coordinating care.   Sch Meds:  Scheduled Meds:  amLODipine  10 mg Oral Daily   amoxicillin-clavulanate  1 tablet Oral Q12H   enoxaparin (LOVENOX) injection  40 mg Subcutaneous Q24H   metoprolol succinate  12.5 mg Oral Daily   potassium chloride  40 mEq Oral Q4H   tamsulosin  0.4 mg Oral QPC supper   Continuous Infusions:   PRN Meds:.acetaminophen **OR** acetaminophen (TYLENOL) oral liquid 160 mg/5 mL **OR** acetaminophen, hydrALAZINE, labetalol, LORazepam  Antimicrobials: Anti-infectives (From admission, onward)    Start     Dose/Rate Route Frequency Ordered Stop   01/12/24 1000  amoxicillin-clavulanate (AUGMENTIN) 875-125 MG per tablet 1 tablet        1 tablet Oral Every 12 hours 01/12/24 0912 01/14/24 0959   01/08/24 1000  Ampicillin-Sulbactam (UNASYN) 3 g in sodium chloride 0.9 % 100 mL IVPB  Status:  Discontinued        3 g 200 mL/hr over 30 Minutes Intravenous Every 6  hours 01/08/24 0843 01/12/24 0912        I have personally reviewed the following labs and images: CBC: Recent Labs  Lab 01/05/24 1247 01/05/24 1251 01/08/24 0630 01/09/24 0503 01/10/24 0504 01/11/24 0502 01/12/24 0649  WBC 4.6   < > 13.5* 11.1* 7.9 6.5 5.5  NEUTROABS 2.5  --   --   --   --   --   --   HGB 12.8*   < > 12.5* 12.4* 12.2* 13.2 12.5*  HCT 36.9*   < > 37.0* 37.4* 36.7* 39.2 37.4*  MCV 97.9   < > 101.4* 101.9* 102.5* 101.8* 101.1*  PLT 227   < > 210 193 189 216 230   < > = values in this interval not displayed.   BMP &GFR Recent Labs  Lab 01/05/24 1247 01/05/24 1251 01/07/24 0321 01/08/24 0630 01/09/24 0503 01/10/24 0504 01/11/24 0502 01/12/24 0649  NA 142   < > 142 144 143 148* 146* 147*  K 2.6*   < > 3.3* 3.2* 3.1* 3.3* 3.3* 2.8*  CL 104   < > 106 108 108 110 107 110  CO2 29   < >  24 24 24 25 25 26   GLUCOSE 125*   < > 136* 130* 127* 125* 124* 115*  BUN 14   < > 14 19 17 16 15 18   CREATININE 1.30*   < > 1.23 1.32* 1.37* 1.27* 1.21 1.35*  CALCIUM 8.4*   < > 8.5* 8.8* 8.3* 8.3* 8.4* 8.1*  MG 2.0  --  2.1  --  2.0  --   --  2.0  PHOS  --   --   --   --   --   --   --  3.8   < > = values in this interval not displayed.   Estimated Creatinine Clearance: 42.3 mL/min (A) (by C-G formula based on SCr of 1.35 mg/dL (H)). Liver & Pancreas: Recent Labs  Lab 01/05/24 1247 01/12/24 0649  AST 18  --   ALT 10  --   ALKPHOS 56  --   BILITOT 0.8  --   PROT 6.0*  --   ALBUMIN 3.2* 2.5*   No results for input(s): "LIPASE", "AMYLASE" in the last 168 hours. No results for input(s): "AMMONIA" in the last 168 hours. Diabetic: No results for input(s): "HGBA1C" in the last 72 hours. Recent Labs  Lab 01/11/24 0618 01/11/24 1125 01/11/24 1606 01/11/24 2116 01/12/24 0610  GLUCAP 126* 190* 114* 157* 118*   Cardiac Enzymes: Recent Labs  Lab 01/05/24 1247  CKTOTAL 117   No results for input(s): "PROBNP" in the last 8760 hours. Coagulation Profile: Recent  Labs  Lab 01/05/24 1247  INR 1.1   Thyroid Function Tests: No results for input(s): "TSH", "T4TOTAL", "FREET4", "T3FREE", "THYROIDAB" in the last 72 hours. Lipid Profile: No results for input(s): "CHOL", "HDL", "LDLCALC", "TRIG", "CHOLHDL", "LDLDIRECT" in the last 72 hours. Anemia Panel: No results for input(s): "VITAMINB12", "FOLATE", "FERRITIN", "TIBC", "IRON", "RETICCTPCT" in the last 72 hours. Urine analysis:    Component Value Date/Time   COLORURINE YELLOW 12/07/2022 1210   APPEARANCEUR CLEAR 12/07/2022 1210   LABSPEC 1.024 12/07/2022 1210   PHURINE 5.0 12/07/2022 1210   GLUCOSEU NEGATIVE 12/07/2022 1210   HGBUR NEGATIVE 12/07/2022 1210   BILIRUBINUR NEGATIVE 12/07/2022 1210   KETONESUR 5 (A) 12/07/2022 1210   PROTEINUR 30 (A) 12/07/2022 1210   UROBILINOGEN 0.2 09/23/2014 1415   NITRITE NEGATIVE 12/07/2022 1210   LEUKOCYTESUR NEGATIVE 12/07/2022 1210   Sepsis Labs: Invalid input(s): "PROCALCITONIN", "LACTICIDVEN"  Microbiology: No results found for this or any previous visit (from the past 240 hours).  Radiology Studies: No results found.     Tyniah Kastens T. Taijon Vink [facility]  If 7PM-7AM, please contact night-coverage [email or web address] 01/12/2024, 12:29 PM

## 2024-01-12 NOTE — Plan of Care (Addendum)
 Abx switched to P.O, Bp monitoring, DC to SNF, weaned off O2.  1800 Robitussin, and breathing treatment  added for phlegm   Problem: Intracerebral Hemorrhage Tissue Perfusion: Goal: Complications of Intracerebral Hemorrhage will be minimized Outcome: Not Met (add Reason)   Problem: Coping: Goal: Will verbalize positive feelings about self Outcome: Not Met (add Reason) Goal: Will identify appropriate support needs Outcome: Not Met (add Reason)   Problem: Education: Goal: Ability to describe self-care measures that may prevent or decrease complications (Diabetes Survival Skills Education) will improve Outcome: Not Met (add Reason) Goal: Individualized Educational Video(s) Outcome: Not Met (add Reason)   Problem: Nutritional: Goal: Maintenance of adequate nutrition will improve Outcome: Not Met (add Reason) Goal: Progress toward achieving an optimal weight will improve Outcome: Not Met (add Reason)   Problem: Skin Integrity: Goal: Risk for impaired skin integrity will decrease Outcome: Not Met (add Reason)   Problem: Tissue Perfusion: Goal: Adequacy of tissue perfusion will improve Outcome: Not Met (add Reason)   Problem: Safety: Goal: Ability to remain free from injury will improve Outcome: Not Met (add Reason)   Problem: Pain Managment: Goal: General experience of comfort will improve and/or be controlled Outcome: Not Met (add Reason)

## 2024-01-12 NOTE — Progress Notes (Signed)
 Speech Language Pathology Treatment: Dysphagia  Patient Details Name: John [name] MRN: [id number] DOB: Aug 29, 1935 Today's Date: 01/12/2024 Time: 5784-6962 SLP Time Calculation (min) (ACUTE ONLY): 8 min  Assessment / Plan / Recommendation Clinical Impression  Session this date was significantly limited by lethargy. He roused for brief periods of time when SLP provided oral care, attempted to reposition pt, and washed his face before he returned to snoring. Repositioned pt to a reclined position as this was beneficial in redirecting the bolus past the laryngeal vestibule as it spills over the epiglottis per MBS 3/19. When briefly more alert, observed pt with cup sips of honey thick liquid. He needed Max cueing to swallow x2 with each sip. Recommend continuing current diet with full supervision to assist with reclining pt and to cue pt to swallow x2 with each bolus. SLP will continue following.    HPI HPI: KENNITH MORSS is a 89 y.o. male with hx of A-fib not on anticoagulation, CAD, CKD stage II, DM, hyperlipidemia who presents with right-sided weakness that started abruptly.  He was in his normal state of health when he woke up this morning, but got up to go to the bathroom around 11 AM and when he did so, he suddenly developed right-sided weakness that caused him to fall.  He does not think that he hit his head.  He was found to have left basal ganglia intraparenchymal hematoma.  Patient initially passed the [facility] Screen on 01/05/2024 but began to have issues swallowing this date.      SLP Plan  Continue with current plan of care      Recommendations for follow up therapy are one component of a multi-disciplinary discharge planning process, led by the attending physician.  Recommendations may be updated based on patient status, additional functional criteria and insurance authorization.    Recommendations  Diet recommendations: Dysphagia 2 (fine chop);Honey-thick liquid Liquids  provided via: Teaspoon;Cup Medication Administration: Crushed with puree Supervision: Staff to assist with self feeding;Full supervision/cueing for compensatory strategies Compensations: Minimize environmental distractions;Slow rate;Small sips/bites;Multiple dry swallows after each bite/sip;Other (Comment) (swallow x2 per bolus) Postural Changes and/or Swallow Maneuvers: Seated upright 90 degrees;Upright 30-60 min after meal                  Oral care QID;Oral care prior to ice chip/H20   Frequent or constant Supervision/Assistance Dysphagia, oropharyngeal phase (R13.12)     Continue with current plan of care     Gwynneth Aliment, M.A., CF-SLP Speech Language Pathology, Acute Rehabilitation Services  Secure Chat preferred [phone]   01/12/2024, 10:11 AM

## 2024-01-13 ENCOUNTER — Inpatient Hospital Stay (HOSPITAL_COMMUNITY)

## 2024-01-13 DIAGNOSIS — Z7189 Other specified counseling: Secondary | ICD-10-CM

## 2024-01-13 DIAGNOSIS — J9601 Acute respiratory failure with hypoxia: Secondary | ICD-10-CM | POA: Diagnosis not present

## 2024-01-13 DIAGNOSIS — J690 Pneumonitis due to inhalation of food and vomit: Secondary | ICD-10-CM | POA: Diagnosis not present

## 2024-01-13 LAB — GLUCOSE, CAPILLARY
Glucose-Capillary: 148 mg/dL — ABNORMAL HIGH (ref 70–99)
Glucose-Capillary: 138 mg/dL — ABNORMAL HIGH (ref 70–99)
Glucose-Capillary: 218 mg/dL — ABNORMAL HIGH (ref 70–99)

## 2024-01-13 LAB — CBC
HCT: 39.6 % (ref 39.0–52.0)
Hemoglobin: 13.2 g/dL (ref 13.0–17.0)
MCH: 33.4 pg (ref 26.0–34.0)
MCHC: 33.3 g/dL (ref 30.0–36.0)
MCV: 100.3 fL — ABNORMAL HIGH (ref 80.0–100.0)
Platelets: 250 10*3/uL (ref 150–400)
RBC: 3.95 MIL/uL — ABNORMAL LOW (ref 4.22–5.81)
RDW: 13.4 % (ref 11.5–15.5)
WBC: 11.5 10*3/uL — ABNORMAL HIGH (ref 4.0–10.5)
nRBC: 0.2 % (ref 0.0–0.2)

## 2024-01-13 LAB — RENAL FUNCTION PANEL
Albumin: 2.6 g/dL — ABNORMAL LOW (ref 3.5–5.0)
Anion gap: 12 (ref 5–15)
BUN: 19 mg/dL (ref 8–23)
CO2: 23 mmol/L (ref 22–32)
Calcium: 8.3 mg/dL — ABNORMAL LOW (ref 8.9–10.3)
Chloride: 110 mmol/L (ref 98–111)
Creatinine, Ser: 1.3 mg/dL — ABNORMAL HIGH (ref 0.61–1.24)
GFR, Estimated: 53 mL/min — ABNORMAL LOW (ref >60)
Glucose, Bld: 167 mg/dL — ABNORMAL HIGH (ref 70–99)
Phosphorus: 3 mg/dL (ref 2.5–4.6)
Potassium: 3.8 mmol/L (ref 3.5–5.1)
Sodium: 145 mmol/L (ref 135–145)

## 2024-01-13 LAB — BLOOD GAS, VENOUS
Acid-base deficit: 0.8 mmol/L (ref 0.0–2.0)
Bicarbonate: 24.9 mmol/L (ref 20.0–28.0)
Drawn by: 8029
O2 Saturation: 57.8 %
Patient temperature: 36.8
pCO2, Ven: 44 mmHg (ref 44–60)
pH, Ven: 7.36 (ref 7.25–7.43)
pO2, Ven: 34 mmHg (ref 32–45)

## 2024-01-13 LAB — PROCALCITONIN: Procalcitonin: 1.27 ng/mL

## 2024-01-13 LAB — MAGNESIUM: Magnesium: 1.9 mg/dL (ref 1.7–2.4)

## 2024-01-13 LAB — BRAIN NATRIURETIC PEPTIDE: B Natriuretic Peptide: 216.9 pg/mL — ABNORMAL HIGH (ref 0.0–100.0)

## 2024-01-13 MED ORDER — ARFORMOTEROL TARTRATE 15 MCG/2ML IN NEBU
15.0000 ug | INHALATION_SOLUTION | Freq: Two times a day (BID) | RESPIRATORY_TRACT | Status: DC
  Administered 2024-01-13 – 2024-01-14 (×2): 15 ug via RESPIRATORY_TRACT
  Filled 2024-01-13 (×2): qty 2

## 2024-01-13 MED ORDER — BUDESONIDE 0.5 MG/2ML IN SUSP
0.5000 mg | Freq: Two times a day (BID) | RESPIRATORY_TRACT | Status: DC
  Administered 2024-01-13 – 2024-01-14 (×2): 0.5 mg via RESPIRATORY_TRACT
  Filled 2024-01-13 (×2): qty 2

## 2024-01-13 MED ORDER — LINEZOLID 600 MG/300ML IV SOLN
600.0000 mg | Freq: Two times a day (BID) | INTRAVENOUS | Status: DC
  Administered 2024-01-13: 600 mg via INTRAVENOUS
  Filled 2024-01-13 (×3): qty 300

## 2024-01-13 MED ORDER — SCOPOLAMINE 1 MG/3DAYS TD PT72
1.0000 | MEDICATED_PATCH | TRANSDERMAL | Status: DC
  Administered 2024-01-13: 1.5 mg via TRANSDERMAL
  Filled 2024-01-13: qty 1

## 2024-01-13 MED ORDER — MORPHINE SULFATE (PF) 2 MG/ML IV SOLN
4.0000 mg | INTRAVENOUS | Status: DC | PRN
  Administered 2024-01-13 – 2024-01-14 (×3): 4 mg via INTRAVENOUS
  Filled 2024-01-13 (×3): qty 2

## 2024-01-13 MED ORDER — FUROSEMIDE 10 MG/ML IJ SOLN
40.0000 mg | Freq: Once | INTRAMUSCULAR | Status: AC
  Administered 2024-01-13: 40 mg via INTRAVENOUS
  Filled 2024-01-13: qty 4

## 2024-01-13 MED ORDER — METOPROLOL SUCCINATE ER 25 MG PO TB24: 25.0000 mg | ORAL_TABLET | Freq: Every day | ORAL | Status: DC

## 2024-01-13 MED ORDER — FUROSEMIDE 10 MG/ML IJ SOLN
40.0000 mg | Freq: Once | INTRAMUSCULAR | Status: DC
  Filled 2024-01-13: qty 4

## 2024-01-13 MED ORDER — PIPERACILLIN-TAZOBACTAM 3.375 G IVPB
3.3750 g | Freq: Three times a day (TID) | INTRAVENOUS | Status: DC
  Administered 2024-01-13 – 2024-01-14 (×2): 3.375 g via INTRAVENOUS
  Filled 2024-01-13 (×3): qty 50

## 2024-01-13 MED ORDER — MORPHINE SULFATE (PF) 2 MG/ML IV SOLN
4.0000 mg | Freq: Once | INTRAVENOUS | Status: AC
  Administered 2024-01-13: 4 mg via INTRAVENOUS
  Filled 2024-01-13: qty 2

## 2024-01-13 MED ORDER — REVEFENACIN 175 MCG/3ML IN SOLN
175.0000 ug | Freq: Every day | RESPIRATORY_TRACT | Status: DC
  Administered 2024-01-13 – 2024-01-14 (×2): 175 ug via RESPIRATORY_TRACT
  Filled 2024-01-13 (×2): qty 3

## 2024-01-13 NOTE — Progress Notes (Signed)
 PROGRESS NOTE  John [name] EXB:[id number] DOB: 1935-09-01   PCP: [name], MD  Patient is from: [facility]  DOA: 01/05/2024 LOS: 8  Chief complaints Chief Complaint  Patient presents with   Code Stroke     Brief Narrative / Interim history: 89 year old M with PMH of CAD/CABG in 1996, PAF not on AC due to POTS/recurrent fall, ICH, HFpEF, prediabetes, CKD-3A, PUD s/p gastrectomy and vagotomy, HTN and anxiety presented to ED with right-sided weakness and dysarthria after found down at ALF.  In ED, he was hypokalemic to 2.6.  WBC 14.6.  CXR with cardiomegaly, possible central venous congestion, left base and right middle opacity.  CT head raise concern for 3.8 x 1.5 x 3.1 cm (9 mm) acute parenchymal hemorrhage centered within the left basal ganglia with surrounding edema and chronic encephalomalacia/gliosis within the right superior frontal gyrus from prior intraparenchymal hemorrhage.  CT angio head and neck negative for LVO, aneurysm or AVM but patchy and asymmetric dependent opacity in visible right lung.  MRI brain and repeat CT head the next day without progression of acute ICH on 3/14.John [name]   Neurology recommended consideration of Watchman device or ASPIR trial outpatient.  SLP recommended dysphagia 2 diet.  Continues to have some aspiration.  Therapy recommended SNF.  Subjective: Seen and examined earlier this morning.  No major events overnight of this morning.  Patient has no complaints.  He denies chest pain or shortness of breath.  Denies nausea or vomiting.  Coughing some.  Documented desaturations to mid 80s overnight.   Objective: Vitals:   01/12/24 2345 01/13/24 0353 01/13/24 0729 01/13/24 1122  BP: (!) 149/56 (!) 159/71 (!) 167/74 (!) 163/69  Pulse: 78 98 99 91  Resp: 16 18 20 16   Temp: 98.6 F (37 C) 98.6 F (37 C) 98.8 F (37.1 C) 98.7 F (37.1 C)  TempSrc: Oral Oral Oral Oral  SpO2: (!) 82% (!) 85% 92% 93%  Weight:      Height:         Examination:  GENERAL: No apparent distress.  Nontoxic. HEENT: MMM.  Vision and hearing grossly intact.  NECK: Supple.  No apparent JVD.  RESP:  No IWOB.  Very rhonchorous bilaterally.  CVS:  RRR. Heart sounds normal.  ABD/GI/GU: BS+. Abd distended but not tender. MSK/EXT:  Moves extremities. No apparent deformity. No edema.  SKIN: no apparent skin lesion or wound NEURO: Awake and alert.  Oriented x 4.  Follows commands.  Has right facial droop.  Some dysarthria.  PSYCH: Calm. Normal affect.   Consultants:  Neurology  Procedures: None  Microbiology summarized: None  Assessment and plan: Intracranial hemorrhage: Presents with right-sided weakness and right facial droop after found down at ALF.  CT and MRI showed left basal ganglia ICH measuring 3.8 x 1.5 x 3.1 cm (9 mm).  Repeat CT head 24 hours later with stable ICH.  Patient is not on anticoagulation or antiplatelets.  TTE without significant finding other than moderately dilated LAE.  LDL 121.  A1c 5.2%. -Neurology signed off -SLP recommended dysphagia 2 diet -Therapy recommended SNF  Possible aspiration pneumonia:  No respiratory distress but some desaturation to mid 80s.  He is very rhonchorous on exam.  CXR this morning with new right perihilar opacity concerning for aspiration or bronchopneumonia.  Mild leukocytosis after initial resolution.  Carries history of COPD but not on inhaler.  I do not see formal PFT either. -IV Unasyn 3/16>> p.o. Augmentin 3/20>> -Start Brovana, Pulmicort and  Mikael Spray -Mucolytic's and DuoNebs as needed. -Aspiration precaution -Manage dysphagia as below  Dysphagia likely due to stroke. -On dysphagia 2 diet per SLP.   -Aspiration precaution   Paroxysmal A-fib: CHA2DS2-VASc score > 4 but patient with POTS and recurrent intracranial hemorrhage. -Neurology suggested consideration of Watchman device or aspire trial outpatient -Defer to patient's cardiologist.  History of CAD/CABG in 1996: No  cardiopulmonary symptoms.  Not on meds.  Chronic HFpEF: TTE with LVEF of 60 to 65%, G1 DD, moderate AR and moderate LAE.  Not on diuretics at home.  Appears euvolemic. -Monitor.   POTS syndrome, orthostatic hypotension: On midodrine and Florinef at home.  Hypertensive here. -Continue holding Florinef given elevated BP, hypernatremia and hypokalemia. -Continue holding midodrine  Essential hypertension: BP elevated here. -Discontinued clonidine patch on 3/19 -Increase amlodipine to 10 mg daily -Increase Toprol-XL to 25 mg daily -Start low-dose amlodipine 5 mg daily -P.o. hydralazine as needed   Mild hypokalemia: Resolved. -Monitor replenish as appropriate  Mild hypernatremia: Resolved. -Continue holding Florinef   BPH: No complaints. -Continue Flomax  Hyperglycemia/prediabetes: Never had A1c over 5.9% as far as 2009. -Discontinue SSI and CBG monitoring  CKD-3A: Stable -Monitor intermittently     Body mass index is 24.7 kg/m.          DVT prophylaxis:  enoxaparin (LOVENOX) injection 40 mg Start: 01/06/24 1130 SCD's Start: 01/05/24 1310  Code Status: DNR-Limited Family Communication: None at bedside Level of care: Telemetry Medical Status is: Inpatient Remains inpatient appropriate because: Acute CVA/ICH, dysphagia, aspiration pneumonia   Final disposition: SNF   55 minutes with more than 50% spent in reviewing records, counseling patient/family and coordinating care.   Sch Meds:  Scheduled Meds:  amLODipine  10 mg Oral Daily   amoxicillin-clavulanate  1 tablet Oral Q12H   enoxaparin (LOVENOX) injection  40 mg Subcutaneous Q24H   metoprolol succinate  12.5 mg Oral Daily   tamsulosin  0.4 mg Oral QPC supper   Continuous Infusions:   PRN Meds:.acetaminophen **OR** acetaminophen (TYLENOL) oral liquid 160 mg/5 mL **OR** acetaminophen, guaiFENesin, hydrALAZINE, ipratropium-albuterol, labetalol, LORazepam  Antimicrobials: Anti-infectives (From admission,  onward)    Start     Dose/Rate Route Frequency Ordered Stop   01/12/24 1000  amoxicillin-clavulanate (AUGMENTIN) 875-125 MG per tablet 1 tablet        1 tablet Oral Every 12 hours 01/12/24 0912 01/14/24 0959   01/08/24 1000  Ampicillin-Sulbactam (UNASYN) 3 g in sodium chloride 0.9 % 100 mL IVPB  Status:  Discontinued        3 g 200 mL/hr over 30 Minutes Intravenous Every 6 hours 01/08/24 0843 01/12/24 0912        I have personally reviewed the following labs and images: CBC: Recent Labs  Lab 01/09/24 0503 01/10/24 0504 01/11/24 0502 01/12/24 0649 01/13/24 0534  WBC 11.1* 7.9 6.5 5.5 11.5*  HGB 12.4* 12.2* 13.2 12.5* 13.2  HCT 37.4* 36.7* 39.2 37.4* 39.6  MCV 101.9* 102.5* 101.8* 101.1* 100.3*  PLT 193 189 216 230 250   BMP &GFR Recent Labs  Lab 01/07/24 0321 01/08/24 0630 01/09/24 0503 01/10/24 0504 01/11/24 0502 01/12/24 0649 01/12/24 1439 01/13/24 0534  NA 142   < > 143 148* 146* 147* 145 145  K 3.3*   < > 3.1* 3.3* 3.3* 2.8* 3.0* 3.8  CL 106   < > 108 110 107 110 111 110  CO2 24   < > 24 25 25 26 24 23   GLUCOSE 136*   < >  127* 125* 124* 115* 178* 167*  BUN 14   < > 17 16 15 18 18 19   CREATININE 1.23   < > 1.37* 1.27* 1.21 1.35* 1.36* 1.30*  CALCIUM 8.5*   < > 8.3* 8.3* 8.4* 8.1* 8.2* 8.3*  MG 2.1  --  2.0  --   --  2.0  --  1.9  PHOS  --   --   --   --   --  3.8 3.4 3.0   < > = values in this interval not displayed.   Estimated Creatinine Clearance: 43.9 mL/min (A) (by C-G formula based on SCr of 1.3 mg/dL (H)). Liver & Pancreas: Recent Labs  Lab 01/12/24 0649 01/12/24 1439 01/13/24 0534  ALBUMIN 2.5* 2.4* 2.6*   No results for input(s): "LIPASE", "AMYLASE" in the last 168 hours. No results for input(s): "AMMONIA" in the last 168 hours. Diabetic: No results for input(s): "HGBA1C" in the last 72 hours. Recent Labs  Lab 01/12/24 1201 01/12/24 1604 01/12/24 2103 01/13/24 0614 01/13/24 1124  GLUCAP 110* 137* 123* 148* 138*   Cardiac Enzymes: No  results for input(s): "CKTOTAL", "CKMB", "CKMBINDEX", "TROPONINI" in the last 168 hours.  No results for input(s): "PROBNP" in the last 8760 hours. Coagulation Profile: No results for input(s): "INR", "PROTIME" in the last 168 hours.  Thyroid Function Tests: No results for input(s): "TSH", "T4TOTAL", "FREET4", "T3FREE", "THYROIDAB" in the last 72 hours. Lipid Profile: No results for input(s): "CHOL", "HDL", "LDLCALC", "TRIG", "CHOLHDL", "LDLDIRECT" in the last 72 hours. Anemia Panel: No results for input(s): "VITAMINB12", "FOLATE", "FERRITIN", "TIBC", "IRON", "RETICCTPCT" in the last 72 hours. Urine analysis:    Component Value Date/Time   COLORURINE YELLOW 12/07/2022 1210   APPEARANCEUR CLEAR 12/07/2022 1210   LABSPEC 1.024 12/07/2022 1210   PHURINE 5.0 12/07/2022 1210   GLUCOSEU NEGATIVE 12/07/2022 1210   HGBUR NEGATIVE 12/07/2022 1210   BILIRUBINUR NEGATIVE 12/07/2022 1210   KETONESUR 5 (A) 12/07/2022 1210   PROTEINUR 30 (A) 12/07/2022 1210   UROBILINOGEN 0.2 09/23/2014 1415   NITRITE NEGATIVE 12/07/2022 1210   LEUKOCYTESUR NEGATIVE 12/07/2022 1210   Sepsis Labs: Invalid input(s): "PROCALCITONIN", "LACTICIDVEN"  Microbiology: No results found for this or any previous visit (from the past 240 hours).  Radiology Studies: DG Chest Port 1 View Result Date: 01/13/2024 CLINICAL DATA:  89 year old male with hypoxia. Left basal ganglia hemorrhage. EXAM: PORTABLE CHEST 1 VIEW COMPARISON:  Chest radiographs 01/08/2024 and earlier. FINDINGS: Portable AP semi upright views at 0754 hours. Chronic sternotomy. Stable cardiomegaly and mediastinal contours. Evidence of gastric hiatal hernia. No pneumothorax or pulmonary edema. No convincing pleural effusion. But patchy and asymmetric new right perihilar peribronchial opacity. Negative visible bowel gas. Stable visualized osseous structures. Prior ACDF. IMPRESSION: 1. New right perihilar opacity suspicious for aspiration or bronchopneumonia in  this setting. 2. Cardiomegaly, hiatal hernia. Electronically Signed   By: Odessa Fleming M.D.   On: 01/13/2024 08:30       Rozanna Cormany T. Latori Beggs [facility]  If 7PM-7AM, please contact night-coverage [email or web address] 01/13/2024, 1:01 PM

## 2024-01-13 NOTE — Progress Notes (Signed)
 RT called to bedside to assess patient upon arrival patient was in respiratory distress breathing 30-45 times a min and sating at 89% RT placed Patient on bipap 15/5 100 R 16 to help with patient WOB and SOB, Rt will continue to monitor

## 2024-01-13 NOTE — Progress Notes (Signed)
 Subjective Received a secure chat from patient's RN stating that patient begins acute respiratory distress with desaturation to 80s.  He was started on nonrebreather by RRT.  On arrival, multiple family members at bedside.  Patient is on NRB.  Very tachypneic with some work of breathing and rhonchi.  He is awake and alert.  Denies chest pain.  He is DNR/DNI.  Confirmed this with multiple family members.  However, family likes to try BiPAP tonight.  Objective. Vitals:   01/13/24 1531 01/13/24 1639 01/13/24 1744 01/13/24 1757  BP:  (!) 148/59    Pulse:  89    Resp:  19    Temp:  98.4 F (36.9 C)    TempSrc:  Oral    SpO2: 93% (!) 85% (!) 89% 91%  Weight:      Height:        GENERAL: No apparent distress.  Nontoxic. HEENT: MMM.  Vision and hearing grossly intact.  NECK: Supple.  No apparent JVD.  RESP: On NRB.  Tachypneic.  Some work of breathing.  Very rhonchorous. CVS: Heart sounds obscured by rhonchi. ABD/GI/GU: BS+. Abd soft, NTND.  MSK/EXT:   No apparent deformity. Moves extremities. No edema.  SKIN: no apparent skin lesion or wound NEURO: Awake and alert.  Follows commands.  Moves all extremities.   Assessment and plan Acute respiratory failure with hypoxia/acute respiratory distress likely due to aspiration.  Chest x-ray with worsening bilateral infiltrate likely from aspiration.  Formal read by radiology pending.  Low suspicion for PE while on prophylactic Lovenox.  Appears euvolemic on exam.  No history of CHF.  Recent echocardiogram with G1 DD. -N.p.o. change oral meds to IV. -Check VBG, BNP, procalcitonin -IV Lasix 40 mg x 1.  Strict intake and output and daily weight. -BiPAP.  If no improvement with BiPAP, patient's daughter, John [name] considering comfort care. -Change Augmentin to IV Zosyn -Continue scheduled and as needed nebulizers  Goal of care discussion: DNR and DNI confirmed with family members.  Also had discussion with patient's daughter, John [name].  If patient does not  improve after trial of BiPAP, she is open to full comfort care.   CRITICAL CARE Performed by: Almon Hercules   Total critical care time: 45 minutes  Critical care time was exclusive of separately billable procedures and treating other patients.  Critical care was necessary to treat or prevent imminent or life-threatening deterioration.  Critical care was time spent personally by me on the following activities: development of treatment plan with patient and/or surrogate as well as nursing, discussions with consultants, evaluation of patient's response to treatment, examination of patient, obtaining history from patient or surrogate, ordering and performing treatments and interventions, ordering and review of laboratory studies, ordering and review of radiographic studies, pulse oximetry and re-evaluation of patient's condition.

## 2024-01-13 NOTE — TOC Progression Note (Signed)
 Transition of Care [facility]) - Progression Note    Patient Details  Name: John [name] MRN: [id number] Date of Birth: 11/30/1934  [facility]) CM/SW Contact  Baldemar Lenis, [place] Phone Number: 01/13/2024, 1:02 PM  Clinical Narrative:   CSW received update from [facility] that patient has been approved. SNF approval [id number], PTAR approval [id number]. Patient not medically ready today, and no bed available at [facility] if patient is stable over the weekend, earliest bed would be Monday if stable. CSW updated MD with barrier to discharge. CSW to follow.    Expected Discharge Plan: Skilled Nursing Facility Barriers to Discharge: Continued Medical Work up  Expected Discharge Plan and Services       Living arrangements for the past 2 months: Assisted Living Facility                                       Social Determinants of Health (SDOH) Interventions SDOH Screenings   Food Insecurity: No Food Insecurity (01/05/2024)  Housing: Low Risk  (01/05/2024)  Transportation Needs: No Transportation Needs (01/05/2024)  Utilities: Not At Risk (01/05/2024)  Depression (PHQ2-9): Low Risk  (06/30/2021)  Social Connections: Unknown (01/05/2024)  Tobacco Use: Medium Risk (01/05/2024)    Readmission Risk Interventions     No data to display

## 2024-01-13 NOTE — Progress Notes (Signed)
 Patient was noted to be in respiratory distress during shift report and he was in 15 L NRB mask. Called  Mindy RN RT and Notified Dr. Arlean Hopping. Via page. See multiple orders in epic.

## 2024-01-13 NOTE — Progress Notes (Signed)
 Pharmacy Antibiotic Note  John [name] is a 89 y.o. male admitted on 01/05/2024 with aspiration pneumonia.  WBC trending up on amoxicillin/clavulanate. Pharmacy has been consulted for piperacillin/tazobactam dosing.   Plan: piperacillin/tazobactam 3.375g q8hr Monitor clinical status, renal function Narrow abx as able and f/u duration    Height: 6' (182.9 cm) Weight: 82.6 kg (182 lb 1.6 oz) IBW/kg (Calculated) : 77.6  Temp (24hrs), Avg:98.6 F (37 C), Min:98.3 F (36.8 C), Max:98.8 F (37.1 C)  Recent Labs  Lab 01/09/24 0503 01/10/24 0504 01/11/24 0502 01/12/24 0649 01/12/24 1439 01/13/24 0534  WBC 11.1* 7.9 6.5 5.5  --  11.5*  CREATININE 1.37* 1.27* 1.21 1.35* 1.36* 1.30*    Estimated Creatinine Clearance: 43.9 mL/min (A) (by C-G formula based on SCr of 1.3 mg/dL (H)).    Allergies  Allergen Reactions   Gabapentin Other (See Comments)    Severe confusion   Prozac [Fluoxetine Hcl] Other (See Comments)    Patient state that it does not agree with him   Soma [Carisoprodol] Other (See Comments)    Patient stated that it does not agree with him   Beta Adrenergic Blockers Other (See Comments)    REACTION: Bradycardia    Antimicrobials this admission: ampsulb 3/16 >> 3/20 Amox/clav 3/20 >> 3/21 Piptazo 3/21 >>     Microbiology results:  2/8 MRSA PCR: neg  Thank you for allowing pharmacy to be a part of this patient's care.  Alphia Moh, PharmD, BCPS, BCCP Clinical Pharmacist  Please check AMION for all [facility] Pharmacy phone numbers After 10:00 PM, call Main Pharmacy [phone]

## 2024-01-13 NOTE — Significant Event (Addendum)
 Rapid Response Event Note   Reason for Call :  Asked to see pt as a second set of eyes.    Pt went into respiratory distress just prior to night shift requiring a NRB.   Orders consisted of: BNP-261.9 Pct-1.27 VBG-7.36/44/34(venous)/24.9 NPO PCXR-Worsened aeration since earlier today. Increased right greater than left interstitial and airspace disease, favoring multifocal pneumonia. Asymmetric pulmonary edema could look similar.  Plan was established for40mg  IV lasix and place pt on bipap. Pt is DNR/DNI and this was confirmed with family by day shift MD.   Initial Focused Assessment:  Pt sitting up in chair with visible respiratory distress. +WOB and accessory muscle use. Breathing congested with inability to cough up secretions. Lungs with rhonchi/rales t/o. Skin cool to touch.   HR-100s, BP-116/65, RR-50s, SpO2-89% on NRB.  Interventions:  NTS-small amount thick tan secretions suctioned up 40mg  Lasix IV given x 2 Bipap 4mg  morphine IV Plan of Care:  Pt RR-40s, SpO2-95% on Bipap 100%. Allow time for morphine/lasix/bipap to work. Family willing to transition to comfort care if pt does not approve with above interventions. Please call RRT if further assistance needed.   Event Summary:   MD Notified: Dr. Alanda Slim notified per notes on day shift. Dr. Arlean Hopping notified on night shift and came to bedside.  Call Time:1922 Arrival Time:1927 End [id number]  Terrilyn Saver, RN

## 2024-01-13 NOTE — Plan of Care (Addendum)
 Aspiration seen on xray, flutter valve ordered, conts on abx. Ongoing care plan  1720 Checked vitals and patient sp02 was 27. Titrated oxygen to 10L but patient was still sustaining 86. Switched to a non-breather mask and called RT. RT came to bedside and suctioned him, patient is on continuous pulse ox. IV lorazepam was administered. Code status DNR. Family at bedside, reassured.  1755 Blood gas ordered, chest xray, NPO status.  1806 Provide at bedside speaking with family. Patient denies pain. Will transition to Bipap at HS.     Problem: Intracerebral Hemorrhage Tissue Perfusion: Goal: Complications of Intracerebral Hemorrhage will be minimized Outcome: Not Met (add Reason)   Problem: Nutrition: Goal: Risk of aspiration will decrease Outcome: Not Met (add Reason) Goal: Dietary intake will improve Outcome: Not Met (add Reason)   Problem: Skin Integrity: Goal: Risk for impaired skin integrity will decrease Outcome: Not Met (add Reason)   Problem: Pain Managment: Goal: General experience of comfort will improve and/or be controlled Outcome: Not Met (add Reason)   Problem: Safety: Goal: Ability to remain free from injury will improve Outcome: Not Met (add Reason)   Problem: Skin Integrity: Goal: Risk for impaired skin integrity will decrease Outcome: Not Met (add Reason)

## 2024-01-13 NOTE — Progress Notes (Signed)
 Occupational Therapy Treatment Patient Details Name: John [name] MRN: [id number] DOB: [birth year] Today's Date: 01/13/2024   History of present illness Pt is 89 year old presented to [facility] on  01/05/24 for sudden onset of rt sided weakness. Pt found to have lt basal ganglia ICH. PMH - Lt hip fx, cabg, paroxysmal atrial fibrillation, dm, hypertension, OSA, chronic diastolic heart failure   OT comments  Pt presents with dysarthria and lots of congestion making it challenge for full assessment of his cognition, pt still following simple 1 step commands with increased time. Pt able to perform ~28ft ambulation in hall with chair follow before fatiguing, His RUE was noted to have loose grasp today and needed assist to keep it positioned on DME. OT to continue to progress pt as able, DC plans remain appropriate for skilled rehab <3hrs of inpatient therapy.       If plan is discharge home, recommend the following:  A lot of help with bathing/dressing/bathroom;A little help with walking and/or transfers;Assist for transportation;Assistance with cooking/housework;Help with stairs or ramp for entrance;Supervision due to cognitive status   Equipment Recommendations  BSC/3in1    Recommendations for Other Services      Precautions / Restrictions Precautions Precautions: Fall;Other (comment) Precaution/Restrictions Comments: Goal SBP < 160 Restrictions Weight Bearing Restrictions Per Provider Order: No       Mobility Bed Mobility Overal bed mobility: Needs Assistance Bed Mobility: Supine to Sit     Supine to sit: Min assist          Transfers Overall transfer level: Needs assistance Equipment used: Rolling walker (2 wheels) Transfers: Sit to/from Stand, Bed to chair/wheelchair/BSC Sit to Stand: Contact guard assist     Step pivot transfers: Min assist     General transfer comment: STS from EOB and BSC CGA, cues for hand placement. Mod A STS from recliner. Min A for RW management  and cues to sequence     Balance Overall balance assessment: Needs assistance Sitting-balance support: No upper extremity supported, Feet supported Sitting balance-Leahy Scale: Fair     Standing balance support: Reliant on assistive device for balance, During functional activity, Bilateral upper extremity supported Standing balance-Leahy Scale: Poor                             ADL either performed or assessed with clinical judgement   ADL   Eating/Feeding: Maximal assistance;Sitting               Upper Body Dressing : Moderate assistance;Sitting;Cueing for sequencing       Toilet Transfer: Minimal assistance;Rolling walker (2 wheels);Stand-pivot;BSC/3in1   Toileting- Architect and Hygiene: Maximal assistance;Sit to/from stand       Functional mobility during ADLs: Minimal assistance;Rolling walker (2 wheels);+2 for safety/equipment (chair follow for safety)      Extremity/Trunk Assessment Upper Extremity Assessment RUE Deficits / Details: poor grip on RW, continues to slip and needs additional assist to hold in place            Vision       Perception     Praxis     Communication Communication Communication: Impaired Factors Affecting Communication: Difficulty expressing self;Reduced clarity of speech   Cognition Arousal: Alert Behavior During Therapy: Flat affect Cognition: Difficult to assess Difficult to assess due to: Impaired communication           OT - Cognition Comments: Pt presenting with a lot of congestion, speech is  slurry. R facial droop                 Following commands: Impaired Following commands impaired: Follows one step commands with increased time      Cueing   Cueing Techniques: Verbal cues, Tactile cues  Exercises      Shoulder Instructions       General Comments Pt soiled upon OT arrival, Pt cleaned by OT and rehab tech.    Pertinent Vitals/ Pain       Pain Assessment Pain  Assessment: Faces Faces Pain Scale: No hurt Pain Intervention(s): Monitored during session  Home Living                                          Prior Functioning/Environment              Frequency  Min 2X/week        Progress Toward Goals  OT Goals(current goals can now be found in the care plan section)  Progress towards OT goals: Progressing toward goals  Acute Rehab OT Goals OT Goal Formulation: Patient unable to participate in goal setting Time For Goal Achievement: 01/23/24 Potential to Achieve Goals: Fair  Plan      Co-evaluation                 AM-PAC OT "6 Clicks" Daily Activity     Outcome Measure   Help from another person eating meals?: A Little Help from another person taking care of personal grooming?: A Little Help from another person toileting, which includes using toliet, bedpan, or urinal?: A Lot Help from another person bathing (including washing, rinsing, drying)?: A Lot Help from another person to put on and taking off regular upper body clothing?: A Lot Help from another person to put on and taking off regular lower body clothing?: A Lot 6 Click Score: 14    End of Session Equipment Utilized During Treatment: Gait belt;Rolling walker (2 wheels)  OT Visit Diagnosis: Unsteadiness on feet (R26.81);Muscle weakness (generalized) (M62.81);Other symptoms and signs involving cognitive function   Activity Tolerance Patient tolerated treatment well   Patient Left in chair;with call bell/phone within reach;with chair alarm set   Nurse Communication Mobility status        Time: 7829-5621 OT Time Calculation (min): 37 min  Charges: OT General Charges $OT Visit: 1 Visit OT Treatments $Therapeutic Activity: 23-37 mins  01/13/2024  AB, OTR/L  Acute Rehabilitation Services  Office: [phone]   Tristan Schroeder 01/13/2024, 4:08 PM

## 2024-01-14 DIAGNOSIS — I629 Nontraumatic intracranial hemorrhage, unspecified: Secondary | ICD-10-CM

## 2024-01-14 DIAGNOSIS — N1831 Chronic kidney disease, stage 3a: Secondary | ICD-10-CM

## 2024-01-14 DIAGNOSIS — I610 Nontraumatic intracerebral hemorrhage in hemisphere, subcortical: Secondary | ICD-10-CM | POA: Diagnosis not present

## 2024-01-14 DIAGNOSIS — J9601 Acute respiratory failure with hypoxia: Secondary | ICD-10-CM | POA: Diagnosis not present

## 2024-01-14 DIAGNOSIS — I5032 Chronic diastolic (congestive) heart failure: Secondary | ICD-10-CM

## 2024-01-14 DIAGNOSIS — N179 Acute kidney failure, unspecified: Secondary | ICD-10-CM

## 2024-01-14 DIAGNOSIS — Z66 Do not resuscitate: Secondary | ICD-10-CM | POA: Insufficient documentation

## 2024-01-14 DIAGNOSIS — Z8679 Personal history of other diseases of the circulatory system: Secondary | ICD-10-CM

## 2024-01-14 DIAGNOSIS — I951 Orthostatic hypotension: Secondary | ICD-10-CM

## 2024-01-14 DIAGNOSIS — I480 Paroxysmal atrial fibrillation: Secondary | ICD-10-CM

## 2024-01-14 DIAGNOSIS — Z515 Encounter for palliative care: Secondary | ICD-10-CM

## 2024-01-14 DIAGNOSIS — J690 Pneumonitis due to inhalation of food and vomit: Secondary | ICD-10-CM | POA: Insufficient documentation

## 2024-01-14 DIAGNOSIS — J449 Chronic obstructive pulmonary disease, unspecified: Secondary | ICD-10-CM

## 2024-01-14 DIAGNOSIS — I2510 Atherosclerotic heart disease of native coronary artery without angina pectoris: Secondary | ICD-10-CM

## 2024-01-14 LAB — RENAL FUNCTION PANEL
Albumin: 2.4 g/dL — ABNORMAL LOW (ref 3.5–5.0)
Anion gap: 13 (ref 5–15)
BUN: 31 mg/dL — ABNORMAL HIGH (ref 8–23)
CO2: 20 mmol/L — ABNORMAL LOW (ref 22–32)
Calcium: 8.2 mg/dL — ABNORMAL LOW (ref 8.9–10.3)
Chloride: 110 mmol/L (ref 98–111)
Creatinine, Ser: 2.21 mg/dL — ABNORMAL HIGH (ref 0.61–1.24)
GFR, Estimated: 28 mL/min — ABNORMAL LOW (ref >60)
Glucose, Bld: 136 mg/dL — ABNORMAL HIGH (ref 70–99)
Phosphorus: 6.4 mg/dL — ABNORMAL HIGH (ref 2.5–4.6)
Potassium: 4.6 mmol/L (ref 3.5–5.1)
Sodium: 143 mmol/L (ref 135–145)

## 2024-01-14 LAB — CBC
HCT: 37.1 % — ABNORMAL LOW (ref 39.0–52.0)
Hemoglobin: 12.1 g/dL — ABNORMAL LOW (ref 13.0–17.0)
MCH: 33.8 pg (ref 26.0–34.0)
MCHC: 32.6 g/dL (ref 30.0–36.0)
MCV: 103.6 fL — ABNORMAL HIGH (ref 80.0–100.0)
Platelets: 215 10*3/uL (ref 150–400)
RBC: 3.58 MIL/uL — ABNORMAL LOW (ref 4.22–5.81)
RDW: 14 % (ref 11.5–15.5)
WBC: 11.2 10*3/uL — ABNORMAL HIGH (ref 4.0–10.5)
nRBC: 0.2 % (ref 0.0–0.2)

## 2024-01-14 LAB — MAGNESIUM: Magnesium: 1.9 mg/dL (ref 1.7–2.4)

## 2024-01-14 LAB — GLUCOSE, CAPILLARY: Glucose-Capillary: 145 mg/dL — ABNORMAL HIGH (ref 70–99)

## 2024-01-14 MED ORDER — GLYCOPYRROLATE 0.2 MG/ML IJ SOLN: 0.2000 mg | INTRAMUSCULAR | Status: DC | PRN

## 2024-01-14 MED ORDER — GLYCOPYRROLATE 1 MG PO TABS: 1.0000 mg | ORAL_TABLET | ORAL | Status: DC | PRN

## 2024-01-14 MED ORDER — POLYVINYL ALCOHOL 1.4 % OP SOLN: 1.0000 [drp] | Freq: Four times a day (QID) | OPHTHALMIC | Status: DC | PRN

## 2024-01-14 MED ORDER — LORAZEPAM 2 MG/ML IJ SOLN
1.0000 mg | INTRAMUSCULAR | Status: DC | PRN
  Administered 2024-01-15: 1 mg via INTRAVENOUS
  Filled 2024-01-14: qty 1

## 2024-01-14 MED ORDER — ONDANSETRON HCL 4 MG/2ML IJ SOLN: 4.0000 mg | Freq: Four times a day (QID) | INTRAMUSCULAR | Status: DC | PRN

## 2024-01-14 MED ORDER — ACETAMINOPHEN 650 MG RE SUPP
650.0000 mg | Freq: Four times a day (QID) | RECTAL | Status: DC | PRN
  Administered 2024-01-14: 650 mg via RECTAL
  Filled 2024-01-14: qty 1

## 2024-01-14 MED ORDER — ONDANSETRON 4 MG PO TBDP: 4.0000 mg | ORAL_TABLET | Freq: Four times a day (QID) | ORAL | Status: DC | PRN

## 2024-01-14 MED ORDER — HYDROMORPHONE HCL 1 MG/ML IJ SOLN
1.0000 mg | INTRAMUSCULAR | Status: DC | PRN
  Administered 2024-01-14 (×3): 1 mg via INTRAVENOUS
  Administered 2024-01-15: 2 mg via INTRAVENOUS
  Filled 2024-01-14 (×2): qty 1
  Filled 2024-01-14: qty 2
  Filled 2024-01-14: qty 1
  Filled 2024-01-14: qty 2

## 2024-01-14 MED ORDER — HALOPERIDOL LACTATE 5 MG/ML IJ SOLN: 0.5000 mg | INTRAMUSCULAR | Status: DC | PRN

## 2024-01-14 MED ORDER — LORAZEPAM 2 MG/ML PO CONC: 1.0000 mg | ORAL | Status: DC | PRN

## 2024-01-14 MED ORDER — HALOPERIDOL LACTATE 2 MG/ML PO CONC: 0.5000 mg | ORAL | Status: DC | PRN

## 2024-01-14 MED ORDER — LORAZEPAM 1 MG PO TABS: 1.0000 mg | ORAL_TABLET | ORAL | Status: DC | PRN

## 2024-01-14 MED ORDER — ACETAMINOPHEN 325 MG PO TABS: 650.0000 mg | ORAL_TABLET | Freq: Four times a day (QID) | ORAL | Status: DC | PRN

## 2024-01-14 MED ORDER — HALOPERIDOL 0.5 MG PO TABS: 0.5000 mg | ORAL_TABLET | ORAL | Status: DC | PRN

## 2024-01-14 MED ORDER — SODIUM CHLORIDE 0.9 % IV SOLN: 12.5000 mg | Freq: Four times a day (QID) | INTRAVENOUS | Status: DC | PRN

## 2024-01-14 NOTE — Plan of Care (Signed)
  Problem: Education: Goal: Knowledge of disease or condition will improve Outcome: Progressing   Problem: Health Behavior/Discharge Planning: Goal: Goals will be collaboratively established with patient/family Outcome: Progressing   Problem: Self-Care: Goal: Verbalization of feelings and concerns over difficulty with self-care will improve Outcome: Not Progressing Goal: Ability to communicate needs accurately will improve Outcome: Not Progressing   Problem: Health Behavior/Discharge Planning: Goal: Ability to manage health-related needs will improve Outcome: Not Progressing   Problem: Clinical Measurements: Goal: Ability to maintain clinical measurements within normal limits will improve Outcome: Not Progressing Goal: Will remain free from infection Outcome: Not Progressing Goal: Respiratory complications will improve Outcome: Not Progressing   Problem: Activity: Goal: Risk for activity intolerance will decrease Outcome: Not Progressing   Problem: Pain Managment: Goal: General experience of comfort will improve and/or be controlled Outcome: Not Progressing

## 2024-01-14 NOTE — Progress Notes (Signed)
 PROGRESS NOTE  John [name] BJY:[id number] DOB: Jan 16, 1935   PCP: [name], MD  Patient is from: [facility]  DOA: 01/05/2024 LOS: 9  Chief complaints Chief Complaint  Patient presents with   Code Stroke     Brief Narrative / Interim history: 89 year old M with PMH of CAD/CABG in 1996, PAF not on AC due to POTS/recurrent fall, ICH, HFpEF, prediabetes, CKD-3A, PUD s/p gastrectomy and vagotomy, HTN and anxiety presented to ED with right-sided weakness and dysarthria after found down at ALF.  In ED, he was hypokalemic to 2.6.  WBC 14.6.  CXR with cardiomegaly, possible central venous congestion, left base and right middle opacity.  CT head raise concern for 3.8 x 1.5 x 3.1 cm (9 mm) acute parenchymal hemorrhage centered within the left basal ganglia with surrounding edema and chronic encephalomalacia/gliosis within the right superior frontal gyrus from prior intraparenchymal hemorrhage.  CT angio head and neck negative for LVO, aneurysm or AVM but patchy and asymmetric dependent opacity in visible right lung.  MRI brain and repeat CT head the next day without progression of acute ICH on 3/14.[name]   Neurology recommended consideration of Watchman device or ASPIR trial outpatient.  SLP recommended dysphagia 2 diet.   Patient had sudden respiratory distress the evening of 3/21 likely from aspiration.  He was started on 15 L by NRB.  Repeat CXR with worsening bilateral infiltrate.  Started on BiPAP, IV Lasix, Zosyn and Zyvox with a plan to transition to full comfort care if no improvement.  Patient did spend the night on BiPAP.  After discussion with patient's daughter, son and son-in-law he was transitioned to full comfort care to order his wishes.  Per [name], his daughter, he has recently expressed his wish stating that he is ready to be with the Lord.   Subjective: Seen and examined earlier this morning.  Patient spent the night on BiPAP.  He was also started on IV morphine for  anxiety and distress.  More comfortable on BiPAP.  Family ready to move forward with comfort care to honor his wishes and desires.  Objective: Vitals:   01/14/24 0600 01/14/24 0700 01/14/24 0806 01/14/24 0812  BP:   121/60   Pulse:   94   Resp: (!) 28 (!) 21    Temp: 98.2 F (36.8 C)  98.4 F (36.9 C)   TempSrc: Axillary  Axillary   SpO2: 96%  97% 94%  Weight:      Height:        Examination:  GENERAL: No apparent distress.  RESP:  No IWOB.  On BiPAP. NEURO: Somnolent. PSYCH: Calm.  No agitation.  Consultants:  Neurology  Procedures: None  Microbiology summarized: None  Assessment and plan: End-of-life care/full comfort care/DNR-comfort: Transitioned to full comfort care after discussion with family members (daughter, son and son-in-law). -Comfort pathway with as needed Dilaudid, Ativan, glycopyrrolate, Zofran and Haldol -Discontinue BiPAP -Wean off oxygen based on comfort.  No need for monitoring saturation -Anticipate in-hospital death.  Intracranial hemorrhage: Presents with right-sided weakness and right facial droop after found down at ALF.  CT and MRI showed left basal ganglia ICH measuring 3.8 x 1.5 x 3.1 cm (9 mm).  Repeat CT head 24 hours later with stable ICH.  Patient is not on anticoagulation or antiplatelets.  TTE without significant finding other than moderately dilated LAE.  LDL 121.  A1c 5.2%.  Aspiration pneumonia: Desaturated to 80s and started on RB and then BiPAP.  Repeat chest x-ray with  worsening bilateral infiltrates despite antibiotics.  Patient with oropharyngeal dysphagia due to CVA, and remains high risk for aspiration.   Dysphagia likely due to stroke.   Paroxysmal A-fib: CHA2DS2-VASc score > 4 but patient with POTS and recurrent intracranial hemorrhage.  History of CAD/CABG in 1996: No cardiopulmonary symptoms.  Not on meds.  Chronic HFpEF: TTE with LVEF of 60 to 65%, G1 DD, moderate AR and moderate LAE.  Not on diuretics at home.   Appears euvolemic.  POTS syndrome, orthostatic hypotension: On midodrine and Florinef at home.  Hypertensive here.  Essential hypertension:    Mild hypokalemia: Resolved.  Mild hypernatremia: Resolved.   BPH: No complaints.  Hyperglycemia/prediabetes: Never had A1c over 5.9% as far as 2009.  AKI on CKD-3A: Stable  Acute metabolic encephalopathy  Body mass index is 24.7 kg/m.          DVT prophylaxis:  Patient is DNR comfort.  Code Status: DNR-comfort. Family Communication: Updated patient's daughter, [name], son and son-in-law at bedside. Level of care: Palliative Care Status is: Inpatient Remains inpatient appropriate because: End-of-life care.   Final disposition: Anticipate in hospital death.   55 minutes with more than 50% spent in reviewing records, counseling patient/family and coordinating care.   Sch Meds:  Scheduled Meds:   Continuous Infusions:  chlorproMAZINE (THORAZINE) 12.5 mg in sodium chloride 0.9 % 25 mL IVPB      PRN Meds:.acetaminophen **OR** acetaminophen, chlorproMAZINE (THORAZINE) 12.5 mg in sodium chloride 0.9 % 25 mL IVPB, glycopyrrolate **OR** glycopyrrolate **OR** glycopyrrolate, haloperidol **OR** haloperidol **OR** haloperidol lactate, HYDROmorphone (DILAUDID) injection, LORazepam **OR** LORazepam **OR** LORazepam, ondansetron **OR** ondansetron (ZOFRAN) IV, polyvinyl alcohol  Antimicrobials: Anti-infectives (From admission, onward)    Start     Dose/Rate Route Frequency Ordered Stop   01/13/24 2200  linezolid (ZYVOX) IVPB 600 mg  Status:  Discontinued        600 mg 300 mL/hr over 60 Minutes Intravenous Every 12 hours 01/13/24 1919 01/14/24 1022   01/13/24 2000  piperacillin-tazobactam (ZOSYN) IVPB 3.375 g  Status:  Discontinued        3.375 g 12.5 mL/hr over 240 Minutes Intravenous Every 8 hours 01/13/24 1845 01/14/24 1022   01/12/24 1000  amoxicillin-clavulanate (AUGMENTIN) 875-125 MG per tablet 1 tablet  Status:  Discontinued         1 tablet Oral Every 12 hours 01/12/24 0912 01/13/24 1821   01/08/24 1000  Ampicillin-Sulbactam (UNASYN) 3 g in sodium chloride 0.9 % 100 mL IVPB  Status:  Discontinued        3 g 200 mL/hr over 30 Minutes Intravenous Every 6 hours 01/08/24 0843 01/12/24 0912        I have personally reviewed the following labs and images: CBC: Recent Labs  Lab 01/10/24 0504 01/11/24 0502 01/12/24 0649 01/13/24 0534 01/14/24 0604  WBC 7.9 6.5 5.5 11.5* 11.2*  HGB 12.2* 13.2 12.5* 13.2 12.1*  HCT 36.7* 39.2 37.4* 39.6 37.1*  MCV 102.5* 101.8* 101.1* 100.3* 103.6*  PLT 189 216 230 250 215   BMP &GFR Recent Labs  Lab 01/09/24 0503 01/10/24 0504 01/11/24 0502 01/12/24 0649 01/12/24 1439 01/13/24 0534 01/14/24 0604  NA 143   < > 146* 147* 145 145 143  K 3.1*   < > 3.3* 2.8* 3.0* 3.8 4.6  CL 108   < > 107 110 111 110 110  CO2 24   < > 25 26 24 23  20*  GLUCOSE 127*   < > 124* 115* 178* 167* 136*  BUN 17   < > 15 18 18 19  31*  CREATININE 1.37*   < > 1.21 1.35* 1.36* 1.30* 2.21*  CALCIUM 8.3*   < > 8.4* 8.1* 8.2* 8.3* 8.2*  MG 2.0  --   --  2.0  --  1.9 1.9  PHOS  --   --   --  3.8 3.4 3.0 6.4*   < > = values in this interval not displayed.   Estimated Creatinine Clearance: 25.8 mL/min (A) (by C-G formula based on SCr of 2.21 mg/dL (H)). Liver & Pancreas: Recent Labs  Lab 01/12/24 0649 01/12/24 1439 01/13/24 0534 01/14/24 0604  ALBUMIN 2.5* 2.4* 2.6* 2.4*   No results for input(s): "LIPASE", "AMYLASE" in the last 168 hours. No results for input(s): "AMMONIA" in the last 168 hours. Diabetic: No results for input(s): "HGBA1C" in the last 72 hours. Recent Labs  Lab 01/12/24 2103 01/13/24 0614 01/13/24 1124 01/13/24 1641 01/14/24 0736  GLUCAP 123* 148* 138* 218* 145*   Cardiac Enzymes: No results for input(s): "CKTOTAL", "CKMB", "CKMBINDEX", "TROPONINI" in the last 168 hours.  No results for input(s): "PROBNP" in the last 8760 hours. Coagulation Profile: No results  for input(s): "INR", "PROTIME" in the last 168 hours.  Thyroid Function Tests: No results for input(s): "TSH", "T4TOTAL", "FREET4", "T3FREE", "THYROIDAB" in the last 72 hours. Lipid Profile: No results for input(s): "CHOL", "HDL", "LDLCALC", "TRIG", "CHOLHDL", "LDLDIRECT" in the last 72 hours. Anemia Panel: No results for input(s): "VITAMINB12", "FOLATE", "FERRITIN", "TIBC", "IRON", "RETICCTPCT" in the last 72 hours. Urine analysis:    Component Value Date/Time   COLORURINE YELLOW 12/07/2022 1210   APPEARANCEUR CLEAR 12/07/2022 1210   LABSPEC 1.024 12/07/2022 1210   PHURINE 5.0 12/07/2022 1210   GLUCOSEU NEGATIVE 12/07/2022 1210   HGBUR NEGATIVE 12/07/2022 1210   BILIRUBINUR NEGATIVE 12/07/2022 1210   KETONESUR 5 (A) 12/07/2022 1210   PROTEINUR 30 (A) 12/07/2022 1210   UROBILINOGEN 0.2 09/23/2014 1415   NITRITE NEGATIVE 12/07/2022 1210   LEUKOCYTESUR NEGATIVE 12/07/2022 1210   Sepsis Labs: Invalid input(s): "PROCALCITONIN", "LACTICIDVEN"  Microbiology: No results found for this or any previous visit (from the past 240 hours).  Radiology Studies: DG Chest Port 1 View Result Date: 01/13/2024 CLINICAL DATA:  Hypoxia EXAM: PORTABLE CHEST 1 VIEW COMPARISON:  Earlier today at 7:54 a.m. FINDINGS: 5:55 p.m. Patient rotated right. Midline trachea. Moderate cardiomegaly. Atherosclerosis in the transverse aorta. No pleural effusion or pneumothorax. Clothing artifact projects over the upper chest bilaterally. Hyperinflation. Diffuse interstitial thickening and coarsening. Increase in right greater than left mid and lower lung interstitial and airspace disease. IMPRESSION: Worsened aeration since earlier today. Increased right greater than left interstitial and airspace disease, favoring multifocal pneumonia. Asymmetric pulmonary edema could look similar. Electronically Signed   By: Jeronimo Greaves M.D.   On: 01/13/2024 18:59       Sherrey North T. Marlayna Bannister [facility]  If 7PM-7AM, please contact  night-coverage [email or web address] 01/14/2024, 3:22 PM

## 2024-01-14 NOTE — Progress Notes (Signed)
 Bladder scan was 63 mL

## 2024-01-14 NOTE — Progress Notes (Signed)
 [facility] night cross cover note:   Shortly after shift change, I was notified of the patient's increased work of breathing, prompting rapid response to be called.  This is in the setting of an existing diagnosis of acute hypoxic respiratory failure in the context of suspected aspiration, with chest x-ray performed earlier today showing worsening of bilateral airspace opacities concerning for aspiration.  The patient was previously started on BiPAP, and received a dose of IV Lasix around 1900 this evening.  He is already on Zosyn given concern for aspiration pneumonia as well as Linezolid for potential hcap pna.  Per my chart review, including review of day hospitalist progress note, the patient is confirmed DNR/DNI, will plan, per day hospitalist's discussions with POA (patient's daughter) to trial BiPAP, lasix, iv abx, with consideration for transitioning to full comfort care measures if further worsening in respiratory status.   Per my evaluation of the patient at bedside the patient was exhibiting evidence of increased work of breathing, with respiratory rate into the high 30s/low 40s.   At this time, I also discussed pt's case with his daughter, who was present at bedside. Per these discussion, patient's daughter was amenable to trying iv morphine to address the patient's air hunger. Morphine 4 mg iv x 1 dose given, with ensuing improvement in the patient's tachypnea, with respiratory rate decreasing into the 20s's. The patient also received a second dose of iv lasix, without any ensuing urine output reported. Will also check bladder scan to further assess.  Given concerns regarding the patient's diminished ability to manage secretions contributing to potential ongoing silent aspiration, I also added scopolamine patch.  I discussed with the patient's daughter the possibility of transitioning to full comfort care measures at this time.  For now, she would prefer to further trial bipap, iv abx, and  monitoring for increase in urine output in response to the aforementioned doses of iv lasix. However, she conveys that while pursuing these measures, that she is also amenable to the patient receiving prn doses of iv morphine for air hunger and reiterates the patient's DNR/DNI status.  Daughter conveys that while the patient is not formal full comfort care measures at this time, that if the patient subsequently shows additional evidence of respiratory decompensation, that she would likely be amenable to transition to full comfort care measures at that time.   For now, based upon the above conversations with patient's daughter, will continue bipap, iv abx (currently on zosyn and linezolid), and I have also also ordered morphine 4 mg iv q2h prn for air hunger, with plan to further assess goals of care in the AM, including the possibility of formal transition to comfort care measures only.     Newton Pigg, DO Hospitalist

## 2024-01-14 NOTE — Progress Notes (Signed)
 Pt is being transitioned to comfort care, was placed on nasal cannula per MD.

## 2024-01-15 DIAGNOSIS — I610 Nontraumatic intracerebral hemorrhage in hemisphere, subcortical: Secondary | ICD-10-CM | POA: Diagnosis not present

## 2024-01-15 DIAGNOSIS — I480 Paroxysmal atrial fibrillation: Secondary | ICD-10-CM | POA: Diagnosis not present

## 2024-01-15 DIAGNOSIS — J690 Pneumonitis due to inhalation of food and vomit: Secondary | ICD-10-CM | POA: Diagnosis not present

## 2024-01-15 DIAGNOSIS — J449 Chronic obstructive pulmonary disease, unspecified: Secondary | ICD-10-CM | POA: Diagnosis not present

## 2024-01-15 MED ORDER — HYDROMORPHONE HCL 1 MG/ML IJ SOLN
2.0000 mg | INTRAMUSCULAR | Status: DC | PRN
  Administered 2024-01-15: 2 mg via INTRAVENOUS

## 2024-01-15 MED ORDER — HYDROMORPHONE HCL-NACL 50-0.9 MG/50ML-% IV SOLN
1.0000 mg/h | INTRAVENOUS | Status: DC
  Administered 2024-01-15: 2 mg/h via INTRAVENOUS
  Filled 2024-01-15: qty 50

## 2024-01-24 NOTE — Progress Notes (Signed)
 Patient's son requested for RN.  Patient is on comfort care has no spontaneous respirstion.  Patient's son reports that he noted patient's breathing become labored, gasping for air and then stopped. Patient has no pulse, heart sound absent.  Charge nurse and RN both confirmed patient's death at 22.  Patient's son present at bedside and will notified the rest of the family members.

## 2024-01-24 NOTE — Plan of Care (Signed)
 Pt when asked if he was in pain stated that he was "ok", but family still anxious about pain being controlled. Pt sleeping most of shift d/t family requesting pain medication.

## 2024-01-24 NOTE — Death Summary Note (Signed)
 DEATH SUMMARY   Patient Details  Name: John [name] MRN: [id number] DOB: 08/25/1935 WUJ:[id number], John Noa, MD Admission/Discharge Information   Admit Date:  2024-01-19  Date of Death: Date of Death: 01/29/2024  Time of Death: Time of Death: 0940  Length of Stay: 10   Principle Cause of death: Acute respiratory failure with hypoxia  Hospital Course: 89 year old M with PMH of CAD/CABG in 05-Feb-1995, PAF not on AC due to POTS/recurrent fall, ICH, HFpEF, prediabetes, CKD-3A, PUD s/p gastrectomy and vagotomy, HTN and anxiety presented to ED with right-sided weakness and dysarthria after found down at ALF.   In ED, he was hypokalemic to 2.6.  WBC 14.6.  CXR with cardiomegaly, possible central venous congestion, left base and right middle opacity.  CT head raise concern for 3.8 x 1.5 x 3.1 cm (9 mm) acute parenchymal hemorrhage centered within the left basal ganglia with surrounding edema and chronic encephalomalacia/gliosis within the right superior frontal gyrus from prior intraparenchymal hemorrhage.  CT angio head and neck negative for LVO, aneurysm or AVM but patchy and asymmetric dependent opacity in visible right lung.  MRI brain and repeat CT head the next day without progression of acute ICH on 3/14.[name]    Neurology recommended consideration of Watchman device or ASPIR trial outpatient.  SLP recommended dysphagia 2 diet.    Patient had sudden respiratory distress the evening of 3/21 likely from aspiration.  He was started on 15 L by NRB.  Repeat CXR with worsening bilateral infiltrate.  Started on BiPAP, IV Lasix, Zosyn and Zyvox with a plan to transition to full comfort care if no improvement.   Patient did spend the night on BiPAP.  After discussion with patient's daughter, son and son-in-law he was transitioned to full comfort care on January 28, 2024, and passed away on 01-29-2024.  Assessment and Plan: End-of-life care/full comfort care/DNR-comfort: Transitioned to full comfort care on  Jan 28, 2024 and passed away on January 29, 2024.  Acute respiratory failure with hypoxia and respiratory distress: Likely due to aspiration pneumonia in the setting of dysphagia and stroke.  Aspiration pneumonia: Desaturated to 80s and started on RB and then BiPAP.  Repeat chest x-ray with worsening bilateral infiltrates despite antibiotics.  Patient with oropharyngeal dysphagia due to CVA, and remains high risk for aspiration.    Intracranial hemorrhage: Presents with right-sided weakness and right facial droop after found down at ALF.  CT and MRI showed left basal ganglia ICH measuring 3.8 x 1.5 x 3.1 cm (9 mm).  Repeat CT head 24 hours later with stable ICH.  Patient is not on anticoagulation or antiplatelets.  TTE without significant finding other than moderately dilated LAE.  LDL 121.  A1c 5.2%.    Dysphagia likely due to stroke.   Paroxysmal A-fib: CHA2DS2-VASc score > 4 but patient with POTS and recurrent intracranial hemorrhage.   History of CAD/CABG in Feb 05, 1995: No cardiopulmonary symptoms.  Not on meds.   Chronic HFpEF: TTE with LVEF of 60 to 65%, G1 DD, moderate AR and moderate LAE.  Not on diuretics at home.  Appears euvolemic.   POTS syndrome, orthostatic hypotension: On midodrine and Florinef at home.  Hypertensive here.   Essential hypertension:     Mild hypokalemia: Resolved.   Mild hypernatremia: Resolved.   BPH: No complaints.   Hyperglycemia/prediabetes: Never had A1c over 5.9% as far as 02-05-2008.   AKI on CKD-3A: Stable   Acute metabolic encephalopathy     Procedures: None  Consultations: Neurology  The results of significant diagnostics from this  hospitalization (including imaging, microbiology, ancillary and laboratory) are listed below for reference.   Significant Diagnostic Studies: DG Chest Port 1 View Result Date: 01/13/2024 CLINICAL DATA:  Hypoxia EXAM: PORTABLE CHEST 1 VIEW COMPARISON:  Earlier today at 7:54 a.m. FINDINGS: 5:55 p.m. Patient rotated right.  Midline trachea. Moderate cardiomegaly. Atherosclerosis in the transverse aorta. No pleural effusion or pneumothorax. Clothing artifact projects over the upper chest bilaterally. Hyperinflation. Diffuse interstitial thickening and coarsening. Increase in right greater than left mid and lower lung interstitial and airspace disease. IMPRESSION: Worsened aeration since earlier today. Increased right greater than left interstitial and airspace disease, favoring multifocal pneumonia. Asymmetric pulmonary edema could look similar. Electronically Signed   By: Jeronimo Greaves M.D.   On: 01/13/2024 18:59   DG Chest Port 1 View Result Date: 01/13/2024 CLINICAL DATA:  89 year old male with hypoxia. Left basal ganglia hemorrhage. EXAM: PORTABLE CHEST 1 VIEW COMPARISON:  Chest radiographs 01/08/2024 and earlier. FINDINGS: Portable AP semi upright views at 0754 hours. Chronic sternotomy. Stable cardiomegaly and mediastinal contours. Evidence of gastric hiatal hernia. No pneumothorax or pulmonary edema. No convincing pleural effusion. But patchy and asymmetric new right perihilar peribronchial opacity. Negative visible bowel gas. Stable visualized osseous structures. Prior ACDF. IMPRESSION: 1. New right perihilar opacity suspicious for aspiration or bronchopneumonia in this setting. 2. Cardiomegaly, hiatal hernia. Electronically Signed   By: Odessa Fleming M.D.   On: 01/13/2024 08:30   DG Swallowing Func-Speech Pathology Result Date: 01/11/2024 Table formatting from the original result was not included. Modified Barium Swallow Study Patient Details Name: John [name] MRN: [id number] Date of Birth: 1935-10-06 Today's Date: 01/11/2024 HPI/PMH: HPI: John [name] is a 89 y.o. male with hx of A-fib not on anticoagulation, CAD, CKD stage II, DM, hyperlipidemia who presents with right-sided weakness that started abruptly.  He was in his normal state of health when he woke up this morning, but got up to go to the bathroom around 11 AM  and when he did so, he suddenly developed right-sided weakness that caused him to fall.  He does not think that he hit his head.  He was found to have left basal ganglia intraparenchymal hematoma.  Patient initially passed the [facility] Screen on 01/05/2024 but began to have issues swallowing this date. Clinical Impression: Clinical Impression: Pt has made mild improvements in oropharyngeal function, still also impacted by posture and positioning as well. He has improved lingual control and posterior propulsion, with transit slow at times but not repetitive/disorganized. He also has some improvements pharyngeally with base of tongue retraction and laryngeal elevation. As a result, he has increasing epiglottic inversion and airway protection. There is less oral and pharyngeal residue across the study, and there is less invasion into the airway. Boluses still spill over the epiglottis directly into the laryngeal vestibule before the swallow so penetration occurs that does not clear, but when he is positioned in a more reclined posture, this helps redirect the bolus more and he has no penetration with purees or honey thick liquids. He did still aspirate nectar thick liquids even in this position (PAS 7). Also noting improvements overall in terms of respiratory status and mentation, recommend starting Dys 2 (finely chopped) diet and honey thick liquids using a partially reclined position for any intake. DIGEST Swallow Severity Rating*             Safety: 2             Efficiency:1  Overall Pharyngeal Swallow Severity: 2 1: mild; 2: moderate; 3: severe; 4: profound *The Dynamic Imaging Grade of Swallowing Toxicity is standardized for the head and neck cancer population, however, demonstrates promising clinical applications across populations to standardize the clinical rating of pharyngeal swallow safety and severity. Factors that may increase risk of adverse event in presence of aspiration Rubye Oaks &  Clearance Coots 2021): Factors that may increase risk of adverse event in presence of aspiration Rubye Oaks & Clearance Coots 2021): Reduced cognitive function; Limited mobility; Frail or deconditioned; Dependence for feeding and/or oral hygiene; Weak cough; Aspiration of thick, dense, and/or acidic materials Recommendations/Plan: Swallowing Evaluation Recommendations Swallowing Evaluation Recommendations Recommendations: PO diet PO Diet Recommendation: Dysphagia 2 (Finely chopped); Moderately thick liquids (Level 3, honey thick) Liquid Administration via: Cup Medication Administration: Crushed with puree Supervision: Staff to assist with self-feeding; Full supervision/cueing for swallowing strategies Swallowing strategies  : Minimize environmental distractions; Slow rate; Small bites/sips; Multiple dry swallows after each bite/sip (swallow 2x if able) Postural changes: Partially reclined for meals Oral care recommendations: Oral care BID (2x/day) Caregiver Recommendations: Have oral suction available; Remove water pitcher; Avoid jello, ice cream, thin soups, popsicles Treatment Plan Treatment Plan Treatment recommendations: Therapy as outlined in treatment plan below Follow-up recommendations: Skilled nursing-short term rehab (<3 hours/day) Functional status assessment: Patient has had a recent decline in their functional status and demonstrates the ability to make significant improvements in function in a reasonable and predictable amount of time. Treatment frequency: Min 2x/week Treatment duration: 2 weeks Interventions: Aspiration precaution training; Compensatory techniques; Patient/family education; Trials of upgraded texture/liquids; Diet toleration management by SLP; Respiratory muscle strength training Recommendations Recommendations for follow up therapy are one component of a multi-disciplinary discharge planning process, led by the attending physician.  Recommendations may be updated based on patient status, additional  functional criteria and insurance authorization. Assessment: Orofacial Exam: Orofacial Exam Oral Cavity: Oral Hygiene: WFL Oral Cavity - Dentition: Adequate natural dentition Orofacial Anatomy: WFL Anatomy: Anatomy: Presence of cervical hardware Boluses Administered: Boluses Administered Boluses Administered: Thin liquids (Level 0); Mildly thick liquids (Level 2, nectar thick); Moderately thick liquids (Level 3, honey thick); Puree; Solid  Oral Impairment Domain: Oral Impairment Domain Lip Closure: No labial escape Tongue control during bolus hold: Cohesive bolus between tongue to palatal seal Bolus preparation/mastication: Slow prolonged chewing/mashing with complete recollection Bolus transport/lingual motion: Slow tongue motion Oral residue: Trace residue lining oral structures Location of oral residue : Tongue; Palate Initiation of pharyngeal swallow : Pyriform sinuses  Pharyngeal Impairment Domain: Pharyngeal Impairment Domain Soft palate elevation: No bolus between soft palate (SP)/pharyngeal wall (PW) Laryngeal elevation: Partial superior movement of thyroid cartilage/partial approximation of arytenoids to epiglottic petiole Anterior hyoid excursion: Partial anterior movement Epiglottic movement: Complete inversion Laryngeal vestibule closure: Incomplete, narrow column air/contrast in laryngeal vestibule Pharyngeal stripping wave : Present - diminished Pharyngeal contraction (A/P view only): N/A Pharyngoesophageal segment opening: Partial distention/partial duration, partial obstruction of flow Tongue base retraction: Trace column of contrast or air between tongue base and PPW Pharyngeal residue: Collection of residue within or on pharyngeal structures Location of pharyngeal residue: Valleculae; Pyriform sinuses  Esophageal Impairment Domain: No data recorded Pill: No data recorded Penetration/Aspiration Scale Score: Penetration/Aspiration Scale Score 1.  Material does not enter airway: Puree; Solid 3.   Material enters airway, remains ABOVE vocal cords and not ejected out: Thin liquids (Level 0); Moderately thick liquids (Level 3, honey thick) 7.  Material enters airway, passes BELOW cords and not ejected out despite cough attempt by patient: Mildly  thick liquids (Level 2, nectar thick) Compensatory Strategies: Compensatory Strategies Straw: Ineffective Ineffective Straw: Moderately thick liquid (Level 3, honey thick) Multiple swallows: Ineffective Ineffective Multiple Swallows: Mildly thick liquid (Level 2, nectar thick) Reclining posture: Effective Effective Reclining Posture: Moderately thick liquid (Level 3, honey thick); Puree   General Information: Caregiver present: No  Diet Prior to this Study: NPO   Temperature : Normal   Respiratory Status: WFL   Supplemental O2: Nasal cannula   History of Recent Intubation: No  Behavior/Cognition: Alert; Cooperative; Requires cueing Self-Feeding Abilities: Needs assist with self-feeding Baseline vocal quality/speech: Hypophonia/low volume Volitional Cough: Able to elicit Volitional Swallow: Able to elicit (inconsistently) Exam Limitations: No limitations Goal Planning: Prognosis for improved oropharyngeal function: Good Barriers to Reach Goals: Cognitive deficits No data recorded Patient/Family Stated Goal: wants food Consulted and agree with results and recommendations: Patient; Nurse Pain: Pain Assessment Pain Assessment: Faces Faces Pain Scale: 0 End of Session: Start Time:SLP Start Time (ACUTE ONLY): 0856 Stop Time: SLP Stop Time (ACUTE ONLY): 0917 Time Calculation:SLP Time Calculation (min) (ACUTE ONLY): 21 min Charges: SLP Evaluations $ SLP Speech Visit: 1 Visit SLP Evaluations $MBS Swallow: 1 Procedure $Swallowing Treatment: 1 Procedure SLP visit diagnosis: SLP Visit Diagnosis: Dysphagia, oropharyngeal phase (R13.12) Past Medical History: Past Medical History: Diagnosis Date  Anxiety   Arthritis   "lower back; left ankle" (03/20/2014)  ASTHMA   Blood transfusion    "related to OHS, ulcers"  CAD (coronary artery disease)   a. s/p 2V CABG 1996. b. Last cath 12/2016 -> patent grafts, med rx.  CAD, ARTERY BYPASS GRAFT   Cataract   CKD (chronic kidney disease), stage II   Colon polyps   adenomatous polyps  COPD   Depression   Diabetes mellitus without complication (HCC)   no meds now  Diverticulosis   Duodenal ulcer without hemorrhage or perforation 12/29/2011  GASTROESOPHAGEAL REFLUX DISEASE   HYPERLIPIDEMIA   Insomnia   Leukodystrophy (HCC)   Memory loss   Nephrolithiasis   "I've had over 320 kidney stones" (03/20/2014)  NEPHROLITHIASIS, HX OF 03/12/2009  Parkinsonism (HCC)   Sleep apnea   does not use CPAP  Stroke (HCC)   TIA  SUPRAVENTRICULAR TACHYCARDIA, HX OF   SVT (supraventricular tachycardia) (HCC) 03/12/2009  TIA   Ulcer  Past Surgical History: Past Surgical History: Procedure Laterality Date  ANKLE FRACTURE SURGERY Left ~ 2012  CARDIAC CATHETERIZATION    COLONOSCOPY    CORONARY ARTERY BYPASS GRAFT  1996  CABG X2  CYSTOSCOPY WITH URETEROSCOPY, STONE BASKETRY AND STENT PLACEMENT    Cystourethroscopy with stent removal.    EXCISIONAL HEMORRHOIDECTOMY    GASTRECTOMY    HIATAL HERNIA REPAIR    IRRIGATION AND DEBRIDEMENT SEBACEOUS CYST    "off my back  KNEE ARTHROSCOPY WITH MEDIAL MENISECTOMY Left 08/24/2019  Procedure: LEFT KNEE ARTHROSCOPY WITH PARTIAL MEDIAL MENISCECTOMY;  Surgeon: Tarry Kos, MD;  Location: [facility];  Service: Orthopedics;  Laterality: Left;  LEFT HEART CATH AND CORS/GRAFTS ANGIOGRAPHY N/A 12/23/2016  Procedure: Left Heart Cath and Cors/Grafts Angiography;  Surgeon: Kathleene Hazel, MD;  Location: [facility] INVASIVE CV LAB;  Service: Cardiovascular;  Laterality: N/A;  POLYPECTOMY    PROSTATE SURGERY    "took the center of my prostate out"  TOTAL HIP ARTHROPLASTY Left 12/03/2022  Procedure: LEFT TOTAL HIP ARTHROPLASTY ANTERIOR APPROACH;  Surgeon: Tarry Kos, MD;  Location: [facility] OR;  Service: Orthopedics;  Laterality: Left;  UPPER GASTROINTESTINAL  ENDOSCOPY    VAGOTOMY  Mahala Menghini., M.A. CCC-SLP Acute Rehabilitation Services Office [phone] Secure chat preferred 01/11/2024, 10:27 AM  DG Swallowing Func-Speech Pathology Result Date: 01/08/2024 Table formatting from the original result was not included. Modified Barium Swallow Study Patient Details Name: SAN LOHMEYER MRN: [id number] Date of Birth: 1934-12-28 Today's Date: 01/08/2024 HPI/PMH: HPI: CLARKSON ROSSELLI is a 89 y.o. male with hx of A-fib not on anticoagulation, CAD, CKD stage II, DM, hyperlipidemia who presents with right-sided weakness that started abruptly.  He was in his normal state of health when he woke up this morning, but got up to go to the bathroom around 11 AM and when he did so, he suddenly developed right-sided weakness that caused him to fall.  He does not think that he hit his head.  He was found to have left basal ganglia intraparenchymal hematoma.  Patient initially passed the [facility] Screen on 01/05/2024 but began to have issues swallowing this date. Clinical Impression: Clinical Impression: Pt has an oropharyngeal dysphagia with reduced strength, sensation, and coordination. Lingual transit is repetitive and he has a collection of oral residue throughout his oral cavity and trailing down the base of his tongue. Pharyngeal deficits are more pronounced and include reduced base of tongue retraction, anterior hyoid excursion, laryngeal elevation, laryngeal vestibule closure, and pharyngeal squeeze. As a result he also has reduced epiglottic deflection and UES opening, although presence of cervical hardware may not help. Liquids spill into his laryngeal vestibule, touching his vocal cords, before a swallow is even elicited. He can clear some of the more shallow penetrates out, but he does not have sufficient protective mechanisms to fully clear his airway. He silently aspirates thin liquids before the swallow. Nectar and honey thick liquids sit on his vocal folds and cannot  be cleared despite cues to cough. Penetration from purees is more transient, but he also has increasing residue throughout the pharynx as boluses become thicker, with over half of the bolus remaining in his pharynx at times. He does not consistently follow commands to perform a second swallow, although this does help to reduce residue when he can. Heavy cueing is needed to try head turns and chin tucks with minimal change in function. He had the most benefit from a reclined posture, which allowed him increased epiglottic inversion and increased clearance through his UES. In this position, he consumed spoonfuls of honey thick liquids and purees with better airway protection and pharyngeal clearance.  Given his mentation and concern for PNA would remain NPO for today, but SLP will f/u  for potential to start some POs if appropriate strategies can be maintained. DIGEST Swallow Severity Rating*  Safety: 2  Efficiency: 3  Overall Pharyngeal Swallow Severity: 3 1: mild; 2: moderate; 3: severe; 4: profound *The Dynamic Imaging Grade of Swallowing Toxicity is standardized for the head and neck cancer population, however, demonstrates promising clinical applications across populations to standardize the clinical rating of pharyngeal swallow safety and severity. Factors that may increase risk of adverse event in presence of aspiration Rubye Oaks & Clearance Coots 2021): Factors that may increase risk of adverse event in presence of aspiration Rubye Oaks & Clearance Coots 2021): Reduced cognitive function; Limited mobility; Frail or deconditioned; Dependence for feeding and/or oral hygiene; Weak cough Recommendations/Plan: Swallowing Evaluation Recommendations Swallowing Evaluation Recommendations Recommendations: NPO (few pieces of ice with RN after oral care to provide moisture to pharynx') Medication Administration: Via alternative means Oral care recommendations: Oral care QID (4x/day) Caregiver Recommendations: Have oral suction available  Treatment Plan Treatment Plan  Treatment recommendations: Therapy as outlined in treatment plan below Follow-up recommendations: Other (comment) (SLP f/u at ALF; SNF if ALF cannot accommodate) Functional status assessment: Patient has had a recent decline in their functional status and demonstrates the ability to make significant improvements in function in a reasonable and predictable amount of time. Treatment frequency: Min 2x/week Treatment duration: 2 weeks Interventions: Aspiration precaution training; Compensatory techniques; Patient/family education; Trials of upgraded texture/liquids; Diet toleration management by SLP; Respiratory muscle strength training Recommendations Recommendations for follow up therapy are one component of a multi-disciplinary discharge planning process, led by the attending physician.  Recommendations may be updated based on patient status, additional functional criteria and insurance authorization. Assessment: Orofacial Exam: No data recorded Anatomy: Anatomy: Presence of cervical hardware Boluses Administered: Boluses Administered Boluses Administered: Thin liquids (Level 0); Mildly thick liquids (Level 2, nectar thick); Moderately thick liquids (Level 3, honey thick); Puree  Oral Impairment Domain: Oral Impairment Domain Lip Closure: No labial escape Tongue control during bolus hold: Escape to lateral buccal cavity/floor of mouth Bolus preparation/mastication: -- (deferred) Bolus transport/lingual motion: Repetitive/disorganized tongue motion Oral residue: Residue collection on oral structures Location of oral residue : Tongue; Palate; Floor of mouth Initiation of pharyngeal swallow : Pyriform sinuses  Pharyngeal Impairment Domain: Pharyngeal Impairment Domain Soft palate elevation: No bolus between soft palate (SP)/pharyngeal wall (PW) Laryngeal elevation: Minimal superior movement of thyroid cartilage with minimal approximation of arytenoids to epiglottic petiole Anterior hyoid  excursion: Partial anterior movement Epiglottic movement: Partial inversion Laryngeal vestibule closure: Incomplete, narrow column air/contrast in laryngeal vestibule Pharyngeal stripping wave : Present - diminished Pharyngeal contraction (A/P view only): N/A Pharyngoesophageal segment opening: Partial distention/partial duration, partial obstruction of flow Tongue base retraction: Narrow column of contrast or air between tongue base and PPW Pharyngeal residue: Majority of contrast within or on pharyngeal structures Location of pharyngeal residue: Valleculae; Pyriform sinuses; Pharyngeal wall  Esophageal Impairment Domain: No data recorded Pill: Pill Consistency administered: -- (deferred) Penetration/Aspiration Scale Score: Penetration/Aspiration Scale Score 2.  Material enters airway, remains ABOVE vocal cords then ejected out: Puree 5.  Material enters airway, CONTACTS cords and not ejected out: Mildly thick liquids (Level 2, nectar thick); Moderately thick liquids (Level 3, honey thick) 8.  Material enters airway, passes BELOW cords without attempt by patient to eject out (silent aspiration) : Thin liquids (Level 0) Compensatory Strategies: Compensatory Strategies Compensatory strategies: Yes Straw: Ineffective Ineffective Straw: Thin liquid (Level 0); Mildly thick liquid (Level 2, nectar thick) Multiple swallows: Effective (but inconsistently elicited) Effective Multiple Swallows: Moderately thick liquid (Level 3, honey thick); Puree Chin tuck: Ineffective Ineffective Chin Tuck: Moderately thick liquid (Level 3, honey thick) Left head turn: Ineffective Ineffective Left Head Turn: Moderately thick liquid (Level 3, honey thick) Right head turn: Ineffective Ineffective Right Head Turn: Moderately thick liquid (Level 3, honey thick) Reclining posture: Effective Effective Reclining Posture: Moderately thick liquid (Level 3, honey thick); Puree (spoonfuls of honey)   General Information: Caregiver present: No  Diet  Prior to this Study: NPO   Temperature : Normal   Respiratory Status: WFL   Supplemental O2: Nasal cannula   History of Recent Intubation: No  Behavior/Cognition: Alert; Cooperative; Requires cueing Self-Feeding Abilities: Needs assist with self-feeding Baseline vocal quality/speech: Hypophonia/low volume Volitional Cough: Able to elicit Volitional Swallow: Able to elicit (inconsistently) Exam Limitations: No limitations Goal Planning: Prognosis for improved oropharyngeal function: Good Barriers to Reach Goals: Cognitive deficits No data recorded Patient/Family Stated Goal: None stated Consulted and agree with results and recommendations:  Patient; Nurse Pain: Pain Assessment Pain Assessment: Faces Faces Pain Scale: 0 End of Session: Start Time:SLP Start Time (ACUTE ONLY): 1147 Stop Time: SLP Stop Time (ACUTE ONLY): 1208 Time Calculation:SLP Time Calculation (min) (ACUTE ONLY): 21 min Charges: SLP Evaluations $ SLP Speech Visit: 1 Visit SLP Evaluations $BSS Swallow: 1 Procedure $MBS Swallow: 1 Procedure SLP visit diagnosis: SLP Visit Diagnosis: Dysphagia, oropharyngeal phase (R13.12) Past Medical History: Past Medical History: Diagnosis Date  Anxiety   Arthritis   "lower back; left ankle" (03/20/2014)  ASTHMA   Blood transfusion   "related to OHS, ulcers"  CAD (coronary artery disease)   a. s/p 2V CABG 1996. b. Last cath 12/2016 -> patent grafts, med rx.  CAD, ARTERY BYPASS GRAFT   Cataract   CKD (chronic kidney disease), stage II   Colon polyps   adenomatous polyps  COPD   Depression   Diabetes mellitus without complication (HCC)   no meds now  Diverticulosis   Duodenal ulcer without hemorrhage or perforation 12/29/2011  GASTROESOPHAGEAL REFLUX DISEASE   HYPERLIPIDEMIA   Insomnia   Leukodystrophy (HCC)   Memory loss   Nephrolithiasis   "I've had over 320 kidney stones" (03/20/2014)  NEPHROLITHIASIS, HX OF 03/12/2009  Parkinsonism (HCC)   Sleep apnea   does not use CPAP  Stroke (HCC)   TIA  SUPRAVENTRICULAR TACHYCARDIA, HX  OF   SVT (supraventricular tachycardia) (HCC) 03/12/2009  TIA   Ulcer  Past Surgical History: Past Surgical History: Procedure Laterality Date  ANKLE FRACTURE SURGERY Left ~ 2012  CARDIAC CATHETERIZATION    COLONOSCOPY    CORONARY ARTERY BYPASS GRAFT  1996  CABG X2  CYSTOSCOPY WITH URETEROSCOPY, STONE BASKETRY AND STENT PLACEMENT    Cystourethroscopy with stent removal.    EXCISIONAL HEMORRHOIDECTOMY    GASTRECTOMY    HIATAL HERNIA REPAIR    IRRIGATION AND DEBRIDEMENT SEBACEOUS CYST    "off my back  KNEE ARTHROSCOPY WITH MEDIAL MENISECTOMY Left 08/24/2019  Procedure: LEFT KNEE ARTHROSCOPY WITH PARTIAL MEDIAL MENISCECTOMY;  Surgeon: Tarry Kos, MD;  Location: [facility];  Service: Orthopedics;  Laterality: Left;  LEFT HEART CATH AND CORS/GRAFTS ANGIOGRAPHY N/A 12/23/2016  Procedure: Left Heart Cath and Cors/Grafts Angiography;  Surgeon: Kathleene Hazel, MD;  Location: [facility];  Service: Cardiovascular;  Laterality: N/A;  POLYPECTOMY    PROSTATE SURGERY    "took the center of my prostate out"  TOTAL HIP ARTHROPLASTY Left 12/03/2022  Procedure: LEFT TOTAL HIP ARTHROPLASTY ANTERIOR APPROACH;  Surgeon: Tarry Kos, MD;  Location: [facility] OR;  Service: Orthopedics;  Laterality: Left;  UPPER GASTROINTESTINAL ENDOSCOPY    VAGOTOMY   Mahala Menghini., M.A. CCC-SLP Acute Rehabilitation Services Office [phone] Secure chat preferred 01/08/2024, 1:51 PM  DG Chest 2 View Result Date: 01/08/2024 CLINICAL DATA:  Pneumonia.  Follow-up study. EXAM: CHEST - 2 VIEW COMPARISON:  01/05/2024 and older exams. FINDINGS: Stable changes from prior cardiac surgery. Cardiac silhouette borderline enlarged. No mediastinal or hilar masses. Mild interstitial prominence, most evident in the lower lungs. Mild hazy opacity lateral to the right hilum and in the lung bases, greater on the left with there is also more confluent opacity. Probable small left effusion. No overt pulmonary edema. No pneumothorax. Overall, no  convincing change from the most recent prior study. IMPRESSION: 1. No significant change from the most recent prior study. 2. Lung opacities most evident at the left lung base suspected to be atelectasis. A component of pneumonia is not excluded, however. Probable small left  effusion. No overt pulmonary edema. Electronically Signed   By: Amie Portland M.D.   On: 01/08/2024 10:52   MR BRAIN W WO CONTRAST Result Date: 01/06/2024 CLINICAL DATA:  Stroke follow-up EXAM: MRI HEAD WITHOUT AND WITH CONTRAST TECHNIQUE: Multiplanar, multiecho pulse sequences of the brain and surrounding structures were obtained without and with intravenous contrast. CONTRAST:  8mL GADAVIST GADOBUTROL 1 MMOL/ML IV SOLN COMPARISON:  None Available. FINDINGS: Brain: No acute infarct, mass effect or extra-axial collection. Unchanged appearance of left basal ganglia intraparenchymal hematoma. Chronic blood products over both frontal poles. There is confluent hyperintense T2-weighted signal within the white matter. Generalized volume loss. The midline structures are normal. There is no abnormal contrast enhancement. Vascular: Normal flow voids. Skull and upper cervical spine: Normal calvarium and skull base. Visualized upper cervical spine and soft tissues are normal. Sinuses/Orbits:No paranasal sinus fluid levels or advanced mucosal thickening. No mastoid or middle ear effusion. Normal orbits. IMPRESSION: 1. Unchanged appearance of left basal ganglia intraparenchymal hematoma. 2. No acute infarct. Electronically Signed   By: Deatra Robinson M.D.   On: 01/06/2024 21:29   CT HEAD WO CONTRAST ( ) Result Date: 01/06/2024 CLINICAL DATA:  Hemorrhagic stroke EXAM: CT HEAD WITHOUT CONTRAST TECHNIQUE: Contiguous axial images were obtained from the base of the skull through the vertex without intravenous contrast. RADIATION DOSE REDUCTION: This exam was performed according to the departmental dose-optimization program which includes automated exposure  control, adjustment of the mA and/or kV according to patient size and/or use of iterative reconstruction technique. COMPARISON:  Head CT from yesterday FINDINGS: Brain: Unchanged hematoma centered at the left basal ganglia measuring up to 3.8 x 1.3 cm on axial images. Mild local mass effect is unchanged. Chronic small vessel disease with confluent ischemic white matter low-density. Chronic encephalomalacia in the superior right frontal lobe. No evidence of acute infarct. No hydrocephalus or new site of hemorrhage Vascular: No hyperdense vessel or unexpected calcification. Skull: Normal. Negative for fracture or focal lesion. Sinuses/Orbits: No acute finding. IMPRESSION: No progression of the acute ICH at the left basal ganglia. Electronically Signed   By: Tiburcio Pea M.D.   On: 01/06/2024 10:06   ECHOCARDIOGRAM COMPLETE Result Date: 01/05/2024    ECHOCARDIOGRAM REPORT   Patient Name:   WILLIARD KELLER Date of Exam: 01/05/2024 Medical Rec #:  [id number]         Height:       72.0 in Accession #:    [id number]        Weight:       182.0 lb Date of Birth:  [birth year]         BSA:          2.047 m Patient Age:    89 years          BP:           141/55 mmHg Patient Gender: M                 HR:           76 bpm. Exam Location:  Inpatient Procedure: 2D Echo, 3D Echo, Cardiac Doppler, Color Doppler, Strain Analysis and            Intracardiac Opacification Agent (Both Spectral and Color Flow            Doppler were utilized during procedure). Indications:    Stroke  History:        Patient has prior history of Echocardiogram examinations, most  recent 11/12/2018.  Sonographer:    Karma Ganja Referring Phys: [phone] MCNEILL P KIRKPATRICK  Sonographer Comments: Global longitudinal strain was attempted. IMPRESSIONS  1. Left ventricular ejection fraction, by estimation, is 60 to 65%. Left ventricular ejection fraction by 3D volume is 59 %. The left ventricle has normal function. The left ventricle has no  regional wall motion abnormalities. Left ventricular diastolic  parameters are consistent with Grade I diastolic dysfunction (impaired relaxation).  2. Right ventricular systolic function is normal. The right ventricular size is moderately enlarged.  3. Left atrial size was moderately dilated.  4. The mitral valve is normal in structure. No evidence of mitral valve regurgitation. No evidence of mitral stenosis.  5. The aortic valve is tricuspid. Aortic valve regurgitation is moderate. Aortic valve sclerosis/calcification is present, without any evidence of aortic stenosis. Aortic regurgitation PHT measures 325 msec. Aortic valve area, by VTI measures 2.98 cm. Aortic valve mean gradient measures 7.0 mmHg. Aortic valve Vmax measures 1.75 m/s.  6. Aortic dilatation noted. There is mild dilatation of the aortic root, measuring 38 mm.  7. The inferior vena cava is normal in size with greater than 50% respiratory variability, suggesting right atrial pressure of 3 mmHg. Conclusion(s)/Recommendation(s): No intracardiac source of embolism detected on this transthoracic study. Consider a transesophageal echocardiogram to exclude cardiac source of embolism if clinically indicated. FINDINGS  Left Ventricle: Left ventricular ejection fraction, by estimation, is 60 to 65%. Left ventricular ejection fraction by 3D volume is 59 %. The left ventricle has normal function. The left ventricle has no regional wall motion abnormalities. Definity contrast agent was given IV to delineate the left ventricular endocardial borders. Global longitudinal strain performed but not reported based on interpreter judgement due to suboptimal tracking. The left ventricular internal cavity size was normal in size. There is no left ventricular hypertrophy. Left ventricular diastolic parameters are consistent with Grade I diastolic dysfunction (impaired relaxation). Normal left ventricular filling pressure. Right Ventricle: The right ventricular size is  moderately enlarged. No increase in right ventricular wall thickness. Right ventricular systolic function is normal. Left Atrium: Left atrial size was moderately dilated. Right Atrium: Right atrial size was normal in size. Pericardium: There is no evidence of pericardial effusion. Mitral Valve: The mitral valve is normal in structure. No evidence of mitral valve regurgitation. No evidence of mitral valve stenosis. Tricuspid Valve: The tricuspid valve is normal in structure. Tricuspid valve regurgitation is not demonstrated. No evidence of tricuspid stenosis. Aortic Valve: The aortic valve is tricuspid. Aortic valve regurgitation is moderate. Aortic regurgitation PHT measures 325 msec. Aortic valve sclerosis/calcification is present, without any evidence of aortic stenosis. Aortic valve mean gradient measures  7.0 mmHg. Aortic valve peak gradient measures 12.2 mmHg. Aortic valve area, by VTI measures 2.98 cm. Pulmonic Valve: The pulmonic valve was normal in structure. Pulmonic valve regurgitation is not visualized. No evidence of pulmonic stenosis. Aorta: Aortic dilatation noted. There is mild dilatation of the aortic root, measuring 38 mm. Venous: The inferior vena cava is normal in size with greater than 50% respiratory variability, suggesting right atrial pressure of 3 mmHg. IAS/Shunts: No atrial level shunt detected by color flow Doppler. Additional Comments: 3D was performed not requiring image post processing on an independent workstation and was normal.  LEFT VENTRICLE PLAX 2D LVIDd:         4.95 cm         Diastology LVIDs:         3.25 cm  LV e' medial:    7.18 cm/s LV PW:         1.00 cm         LV E/e' medial:  10.4 LV IVS:        1.55 cm         LV e' lateral:   6.53 cm/s LVOT diam:     2.10 cm         LV E/e' lateral: 11.5 LV SV:         109 LV SV Index:   53              2D Longitudinal LVOT Area:     3.46 cm        Strain                                2D Strain GLS   -14.0 %                                 (A4C):                                2D Strain GLS   -15.6 %                                (A3C):                                2D Strain GLS   -15.1 %                                (A2C):                                2D Strain GLS   -14.9 %                                Avg:                                 3D Volume EF                                LV 3D EF:    Left                                             ventricul                                             ar  ejection                                             fraction                                             by 3D                                             volume is                                             59 %.                                 3D Volume EF:                                3D EF:        59 %                                LV EDV:       186 ml                                LV ESV:       77 ml                                LV SV:        109 ml RIGHT VENTRICLE             IVC RV Basal diam:  4.90 cm     IVC diam: 1.40 cm RV S prime:     10.40 cm/s TAPSE (M-mode): 2.5 cm LEFT ATRIUM              Index        RIGHT ATRIUM           Index LA diam:        4.80 cm  2.35 cm/m   RA Area:     20.20 cm LA Vol (A2C):   103.0 ml 50.32 ml/m  RA Volume:   63.00 ml  30.78 ml/m LA Vol (A4C):   81.9 ml  40.01 ml/m LA Biplane Vol: 93.7 ml  45.78 ml/m  AORTIC VALVE AV Area (Vmax):    2.81 cm AV Area (Vmean):   2.89 cm AV Area (VTI):     2.98 cm AV Vmax:           175.00 cm/s AV Vmean:          121.000 cm/s AV VTI:            0.365 m AV Peak Grad:  12.2 mmHg AV Mean Grad:      7.0 mmHg LVOT Vmax:         142.00 cm/s LVOT Vmean:        101.000 cm/s LVOT VTI:          0.314 m LVOT/AV VTI ratio: 0.86 AI PHT:            325 msec  AORTA Ao Root diam: 3.80 cm MITRAL VALVE MV Area (PHT): 4.80 cm     SHUNTS MV Decel Time: 158 msec     Systemic VTI:  0.31 m MV E velocity: 74.90 cm/s    Systemic Diam: 2.10 cm MV A velocity: 118.00 cm/s MV E/A ratio:  0.63 Armanda Magic MD Electronically signed by Armanda Magic MD Signature Date/Time: 01/05/2024/4:52:09 PM    Final    DG Chest Portable 1 View Result Date: 01/05/2024 CLINICAL DATA:  Altered mental status, fall EXAM: PORTABLE CHEST 1 VIEW COMPARISON:  12/07/2022, 12/01/2022, 01/15/2021 FINDINGS: Post sternotomy changes. Cardiomegaly with mild central congestion. Patchy atelectasis or infiltrate left base. Hiatal hernia likely contributes to retrocardiac opacity. Vague right mid lung opacity less conspicuous. Aortic atherosclerosis. No pneumothorax IMPRESSION: 1. Cardiomegaly with mild central congestion. 2. Patchy atelectasis or infiltrate at the left base. Hiatal hernia likely contributes to retrocardiac opacity. 3. Vague right mid lung opacity slightly less conspicuous but persistent and chest CT previously recommended for this finding. Electronically Signed   By: Jasmine Pang M.D.   On: 01/05/2024 16:14   CT HEAD CODE STROKE WO CONTRAST Addendum Date: 01/05/2024 ADDENDUM REPORT: 01/05/2024 13:31 ADDENDUM: Impression #1 discussed by telephone on 01/05/2024 at 1:30 pm with provider Dr. Amada Jupiter, who verbally acknowledged these results. Electronically Signed   By: Jackey Loge D.O.   On: 01/05/2024 13:31   Result Date: 01/05/2024 CLINICAL DATA:  Code stroke. Provided history: Neuro deficit, acute, stroke suspected. EXAM: CT HEAD WITHOUT CONTRAST TECHNIQUE: Contiguous axial images were obtained from the base of the skull through the vertex without intravenous contrast. RADIATION DOSE REDUCTION: This exam was performed according to the departmental dose-optimization program which includes automated exposure control, adjustment of the mA and/or kV according to patient size and/or use of iterative reconstruction technique. COMPARISON:  Head CT 12/01/2022. FINDINGS: Brain: Generalized cerebral atrophy. 3.8 x 1.5 x 3.1 cm (9 mL) acute parenchymal  hemorrhage centered within the left basal ganglia with surrounding edema. Focus of chronic encephalomalacia/gliosis again demonstrated within the right superior frontal gyrus from prior intraparenchymal hemorrhage. Background moderate to advanced patchy and ill-defined hypoattenuation within the cerebral white matter, nonspecific but compatible chronic small vessel disease. Vascular: No hyperdense vessel.  Atherosclerotic calcifications. Skull: No calvarial fracture or aggressive osseous lesion. Sinuses/Orbits: No mass or acute finding within the imaged orbits. Mild mucosal thickening within the bilateral ethmoid, bilateral sphenoid and bilateral maxillary sinuses. Small right frontoethmoidal sinus osteoma. Attempts are being made to reach the ordering provider at this time. IMPRESSION: 1. 3.8 x 1.5 x 3.1 cm (9 mL) acute parenchymal hemorrhage centered within the left basal ganglia with surrounding edema. 2. Chronic encephalomalacia/gliosis again demonstrated within the right superior frontal gyrus from prior intraparenchymal hemorrhage. 3. Background parenchymal atrophy and chronic small vessel ischemic disease. 4. Paranasal sinus disease as described. Electronically Signed: By: Jackey Loge D.O. On: 01/05/2024 13:03   CT ANGIO HEAD NECK W WO CM (CODE STROKE) Result Date: 01/05/2024 CLINICAL DATA:  89 year old male code stroke presentation today with left lentiform hemorrhage. EXAM: CT ANGIOGRAPHY HEAD AND NECK TECHNIQUE: Multidetector CT  imaging of the head and neck was performed using the standard protocol during bolus administration of intravenous contrast. Multiplanar CT image reconstructions and MIPs were obtained to evaluate the vascular anatomy. Carotid stenosis measurements (when applicable) are obtained utilizing NASCET criteria, using the distal internal carotid diameter as the denominator. RADIATION DOSE REDUCTION: This exam was performed according to the departmental dose-optimization program which  includes automated exposure control, adjustment of the mA and/or kV according to patient size and/or use of iterative reconstruction technique. CONTRAST:  75mL OMNIPAQUE IOHEXOL 350 MG/ML SOLN COMPARISON:  Head CT 1251 hours. FINDINGS: CTA NECK Skeleton: Previous cervical ACDF C5 through C7 with solid arthrodesis. Previous sternotomy. No acute osseous abnormality identified. Upper chest: Patchy and asymmetric but mostly dependent opacity in the visible right lung. Favor a combination of atelectasis and scarring. Visible major airways are patent. No superior mediastinal lymphadenopathy. Other neck: Trace retained secretions in the hypopharynx. Other neck soft tissue spaces are within normal limits. Aortic arch: Calcified aortic atherosclerosis. Tortuous aortic arch. Three vessel arch. Right carotid system: Brachiocephalic artery soft and calcified plaque, tortuosity without stenosis. Tortuous right CCA with medial soft plaque at the larynx, no stenosis. Calcified plaque at the right ICA origin and tortuosity distal to the bulb without stenosis. Left carotid system: Left CCA origin calcified plaque. Calcified plaque along the medial left ICA origin without stenosis. Vertebral arteries: Calcified right subclavian artery origin with less than 50 % stenosis with respect to the distal vessel. Right vertebral artery origin is normal. Tortuous right V1 segment. Mildly dominant right vertebral artery is patent to the skull base with no significant plaque or stenosis. Left subclavian artery origin, proximal soft and calcified plaque without stenosis. Left vertebral artery origin remains normal. Non dominant but fairly normal caliber left vertebral artery with tortuous V1 segment. No left vertebral stenosis to the skull base. CTA HEAD Posterior circulation: Mild bilateral distal vertebral artery plaque without stenosis. Patent vertebrobasilar junction, PICA origins. Dominant right V4. Patent basilar artery without stenosis.  Normal SCA and PCA origins. Both posterior communicating arteries are present. Bilateral PCA branches are within normal limits. Anterior circulation: Both ICA siphons are patent. Mild cavernous but moderate supraclinoid calcified ICA siphon plaque bilaterally. Mild bilateral siphon tortuosity. Only mild supraclinoid siphon stenosis. Normal posterior communicating arteries. Patent carotid termini. Normal MCA and ACA origins. Dominant right A1. Normal anterior communicating artery. Bilateral ACA branches are within normal limits. Enhancing left lenticulostriate branches of the left MCA with no CTA spot sign identified. No abnormal vascularity identified in the region. Left MCA M1, trifurcation, left MCA branches are within normal limits. Right MCA M1 segment, bifurcation, right MCA branches are within normal limits. Venous sinuses: Patent. Anatomic variants: Dominant right vertebral artery. Dominant right ACA A1. Review of the MIP images confirms the above findings IMPRESSION: 1. Negative for large vessel occlusion, CTA spot sign, intracranial aneurysm, or AVM in association with the acute Left lentiform Hemorrhage. 2. Generalized arterial tortuosity in the head and neck with comparatively mild for age atherosclerosis. No hemodynamically significant arterial stenosis is identified. Aortic Atherosclerosis (ICD10-I70.0). 3. Patchy and asymmetric but mostly dependent opacity in the visible right lung, favor combination of atelectasis and scarring. Electronically Signed   By: Odessa Fleming M.D.   On: 01/05/2024 13:20    Microbiology: No results found for this or any previous visit (from the past 240 hours).  Time spent: 35 minutes  Signed: Almon Hercules, MD 01/12/2024

## 2024-01-24 DEATH — deceased
